# Patient Record
Sex: Female | Born: 1960
Health system: Southern US, Community
[De-identification: ages and names within clinical notes are randomized; demographics above are authoritative.]

## PROBLEM LIST (undated history)

## (undated) DIAGNOSIS — E119 Type 2 diabetes mellitus without complications: Secondary | ICD-10-CM

## (undated) DIAGNOSIS — I3139 Other pericardial effusion (noninflammatory): Secondary | ICD-10-CM

## (undated) DIAGNOSIS — I10 Essential (primary) hypertension: Secondary | ICD-10-CM

## (undated) DIAGNOSIS — I313 Pericardial effusion (noninflammatory): Secondary | ICD-10-CM

## (undated) DIAGNOSIS — I428 Other cardiomyopathies: Secondary | ICD-10-CM

## (undated) DIAGNOSIS — IMO0001 Reserved for inherently not codable concepts without codable children: Secondary | ICD-10-CM

## (undated) DIAGNOSIS — D509 Iron deficiency anemia, unspecified: Secondary | ICD-10-CM

## (undated) DIAGNOSIS — R945 Abnormal results of liver function studies: Secondary | ICD-10-CM

## (undated) DIAGNOSIS — I272 Pulmonary hypertension, unspecified: Secondary | ICD-10-CM

## (undated) DIAGNOSIS — Z720 Tobacco use: Secondary | ICD-10-CM

## (undated) DIAGNOSIS — Z794 Long term (current) use of insulin: Secondary | ICD-10-CM

## (undated) DIAGNOSIS — I5022 Chronic systolic (congestive) heart failure: Secondary | ICD-10-CM

## (undated) DIAGNOSIS — R7989 Other specified abnormal findings of blood chemistry: Secondary | ICD-10-CM

## (undated) DIAGNOSIS — Z9289 Personal history of other medical treatment: Secondary | ICD-10-CM

## (undated) HISTORY — PX: APPENDECTOMY: SHX54

## (undated) NOTE — *Deleted (*Deleted)
Patient states she feels okay this morning. She states ate a part of her breakfast. Endorses feeling cold.

---

## 1984-05-10 DIAGNOSIS — Z9289 Personal history of other medical treatment: Secondary | ICD-10-CM | POA: Insufficient documentation

## 1984-05-10 HISTORY — DX: Personal history of other medical treatment: Z92.89

## 2001-07-24 ENCOUNTER — Inpatient Hospital Stay (HOSPITAL_COMMUNITY): Admission: EM | Admit: 2001-07-24 | Discharge: 2001-07-25 | Payer: Self-pay | Admitting: Emergency Medicine

## 2001-07-24 ENCOUNTER — Encounter: Payer: Self-pay | Admitting: Emergency Medicine

## 2014-06-10 ENCOUNTER — Encounter (HOSPITAL_COMMUNITY): Payer: Self-pay | Admitting: Cardiology

## 2014-06-10 ENCOUNTER — Inpatient Hospital Stay (HOSPITAL_COMMUNITY)
Admission: AD | Admit: 2014-06-10 | Discharge: 2014-06-11 | DRG: 287 | Disposition: A | Discharge status: Home / Self Care | Payer: BLUE CROSS/BLUE SHIELD | Source: Other Acute Inpatient Hospital | Attending: Cardiology | Admitting: Cardiology

## 2014-06-10 DIAGNOSIS — I10 Essential (primary) hypertension: Secondary | ICD-10-CM | POA: Diagnosis present

## 2014-06-10 DIAGNOSIS — I3139 Other pericardial effusion (noninflammatory): Secondary | ICD-10-CM | POA: Diagnosis present

## 2014-06-10 DIAGNOSIS — R7989 Other specified abnormal findings of blood chemistry: Secondary | ICD-10-CM | POA: Diagnosis present

## 2014-06-10 DIAGNOSIS — I272 Pulmonary hypertension, unspecified: Secondary | ICD-10-CM | POA: Diagnosis present

## 2014-06-10 DIAGNOSIS — I313 Pericardial effusion (noninflammatory): Secondary | ICD-10-CM | POA: Diagnosis present

## 2014-06-10 DIAGNOSIS — Z833 Family history of diabetes mellitus: Secondary | ICD-10-CM

## 2014-06-10 DIAGNOSIS — H9190 Unspecified hearing loss, unspecified ear: Secondary | ICD-10-CM | POA: Diagnosis present

## 2014-06-10 DIAGNOSIS — Z823 Family history of stroke: Secondary | ICD-10-CM

## 2014-06-10 DIAGNOSIS — I428 Other cardiomyopathies: Secondary | ICD-10-CM

## 2014-06-10 DIAGNOSIS — I5022 Chronic systolic (congestive) heart failure: Secondary | ICD-10-CM

## 2014-06-10 DIAGNOSIS — Z720 Tobacco use: Secondary | ICD-10-CM

## 2014-06-10 DIAGNOSIS — R945 Abnormal results of liver function studies: Secondary | ICD-10-CM | POA: Diagnosis present

## 2014-06-10 DIAGNOSIS — F17210 Nicotine dependence, cigarettes, uncomplicated: Secondary | ICD-10-CM | POA: Diagnosis present

## 2014-06-10 DIAGNOSIS — E119 Type 2 diabetes mellitus without complications: Secondary | ICD-10-CM

## 2014-06-10 DIAGNOSIS — Z794 Long term (current) use of insulin: Secondary | ICD-10-CM | POA: Diagnosis not present

## 2014-06-10 DIAGNOSIS — Z8249 Family history of ischemic heart disease and other diseases of the circulatory system: Secondary | ICD-10-CM

## 2014-06-10 DIAGNOSIS — I2510 Atherosclerotic heart disease of native coronary artery without angina pectoris: Secondary | ICD-10-CM | POA: Diagnosis present

## 2014-06-10 DIAGNOSIS — I5021 Acute systolic (congestive) heart failure: Secondary | ICD-10-CM | POA: Diagnosis present

## 2014-06-10 HISTORY — DX: Reserved for inherently not codable concepts without codable children: IMO0001

## 2014-06-10 HISTORY — DX: Long term (current) use of insulin: Z79.4

## 2014-06-10 HISTORY — DX: Pericardial effusion (noninflammatory): I31.3

## 2014-06-10 HISTORY — DX: Other cardiomyopathies: I42.8

## 2014-06-10 HISTORY — DX: Essential (primary) hypertension: I10

## 2014-06-10 HISTORY — DX: Tobacco use: Z72.0

## 2014-06-10 HISTORY — DX: Other specified abnormal findings of blood chemistry: R79.89

## 2014-06-10 HISTORY — DX: Pulmonary hypertension, unspecified: I27.20

## 2014-06-10 HISTORY — DX: Type 2 diabetes mellitus without complications: E11.9

## 2014-06-10 HISTORY — DX: Personal history of other medical treatment: Z92.89

## 2014-06-10 HISTORY — DX: Chronic systolic (congestive) heart failure: I50.22

## 2014-06-10 HISTORY — DX: Other pericardial effusion (noninflammatory): I31.39

## 2014-06-10 HISTORY — DX: Abnormal results of liver function studies: R94.5

## 2014-06-10 HISTORY — DX: Iron deficiency anemia, unspecified: D50.9

## 2014-06-10 LAB — CBC WITH DIFFERENTIAL/PLATELET
Basophils Absolute: 0.1 10*3/uL (ref 0.0–0.1)
Basophils Relative: 1 % (ref 0–1)
EOS ABS: 0.1 10*3/uL (ref 0.0–0.7)
Eosinophils Relative: 1 % (ref 0–5)
HCT: 42.7 % (ref 36.0–46.0)
Hemoglobin: 14.7 g/dL (ref 12.0–15.0)
LYMPHS ABS: 2 10*3/uL (ref 0.7–4.0)
Lymphocytes Relative: 23 % (ref 12–46)
MCH: 31.3 pg (ref 26.0–34.0)
MCHC: 34.4 g/dL (ref 30.0–36.0)
MCV: 91 fL (ref 78.0–100.0)
MONO ABS: 0.9 10*3/uL (ref 0.1–1.0)
Monocytes Relative: 10 % (ref 3–12)
Neutro Abs: 5.8 10*3/uL (ref 1.7–7.7)
Neutrophils Relative %: 65 % (ref 43–77)
Platelets: 281 10*3/uL (ref 150–400)
RBC: 4.69 MIL/uL (ref 3.87–5.11)
RDW: 13.4 % (ref 11.5–15.5)
WBC: 8.9 10*3/uL (ref 4.0–10.5)

## 2014-06-10 LAB — PROTIME-INR
INR: 1.07 (ref 0.00–1.49)
Prothrombin Time: 14.1 seconds (ref 11.6–15.2)

## 2014-06-10 LAB — APTT: aPTT: 26 seconds (ref 24–37)

## 2014-06-10 LAB — COMPREHENSIVE METABOLIC PANEL
ALBUMIN: 3.6 g/dL (ref 3.5–5.2)
ALT: 52 U/L — ABNORMAL HIGH (ref 0–35)
AST: 37 U/L (ref 0–37)
Alkaline Phosphatase: 130 U/L — ABNORMAL HIGH (ref 39–117)
Anion gap: 13 (ref 5–15)
BILIRUBIN TOTAL: 0.7 mg/dL (ref 0.3–1.2)
BUN: 20 mg/dL (ref 6–23)
CALCIUM: 9.5 mg/dL (ref 8.4–10.5)
CHLORIDE: 96 mmol/L (ref 96–112)
CO2: 26 mmol/L (ref 19–32)
Creatinine, Ser: 0.99 mg/dL (ref 0.50–1.10)
GFR calc Af Amer: 75 mL/min — ABNORMAL LOW (ref >90)
GFR, EST NON AFRICAN AMERICAN: 64 mL/min — AB (ref >90)
Glucose, Bld: 239 mg/dL — ABNORMAL HIGH (ref 70–99)
POTASSIUM: 3.4 mmol/L — AB (ref 3.5–5.1)
SODIUM: 135 mmol/L (ref 135–145)
Total Protein: 7.3 g/dL (ref 6.0–8.3)

## 2014-06-10 LAB — TSH: TSH: 0.845 u[IU]/mL (ref 0.350–4.500)

## 2014-06-10 LAB — BRAIN NATRIURETIC PEPTIDE: B Natriuretic Peptide: 397 pg/mL — ABNORMAL HIGH (ref 0.0–100.0)

## 2014-06-10 LAB — GLUCOSE, CAPILLARY: Glucose-Capillary: 231 mg/dL — ABNORMAL HIGH (ref 70–99)

## 2014-06-10 LAB — MAGNESIUM: Magnesium: 1.8 mg/dL (ref 1.5–2.5)

## 2014-06-10 MED ORDER — NICOTINE 7 MG/24HR TD PT24
7.0000 mg | MEDICATED_PATCH | Freq: Every day | TRANSDERMAL | Status: DC
  Administered 2014-06-10 – 2014-06-11 (×2): 7 mg via TRANSDERMAL
  Filled 2014-06-10 (×2): qty 1

## 2014-06-10 MED ORDER — INSULIN ASPART PROT & ASPART (70-30 MIX) 100 UNIT/ML ~~LOC~~ SUSP
20.0000 [IU] | Freq: Every day | SUBCUTANEOUS | Status: DC
  Filled 2014-06-10: qty 10

## 2014-06-10 MED ORDER — PNEUMOCOCCAL VAC POLYVALENT 25 MCG/0.5ML IJ INJ
0.5000 mL | INJECTION | INTRAMUSCULAR | Status: AC
  Administered 2014-06-11: 0.5 mL via INTRAMUSCULAR
  Filled 2014-06-10: qty 0.5

## 2014-06-10 MED ORDER — SODIUM CHLORIDE 0.9 % IJ SOLN
3.0000 mL | Freq: Two times a day (BID) | INTRAMUSCULAR | Status: DC
  Administered 2014-06-10: 3 mL via INTRAVENOUS

## 2014-06-10 MED ORDER — CARVEDILOL 3.125 MG PO TABS
3.1250 mg | ORAL_TABLET | Freq: Two times a day (BID) | ORAL | Status: DC
  Administered 2014-06-11 (×2): 3.125 mg via ORAL
  Filled 2014-06-10 (×3): qty 1

## 2014-06-10 MED ORDER — ASPIRIN EC 81 MG PO TBEC
81.0000 mg | DELAYED_RELEASE_TABLET | Freq: Every day | ORAL | Status: DC
  Administered 2014-06-10 – 2014-06-11 (×2): 81 mg via ORAL
  Filled 2014-06-10 (×2): qty 1

## 2014-06-10 MED ORDER — ONDANSETRON HCL 4 MG/2ML IJ SOLN: 4.0000 mg | Freq: Four times a day (QID) | INTRAMUSCULAR | Status: DC | PRN

## 2014-06-10 MED ORDER — HEPARIN SODIUM (PORCINE) 5000 UNIT/ML IJ SOLN
5000.0000 [IU] | Freq: Three times a day (TID) | INTRAMUSCULAR | Status: DC
Start: 2014-06-10 — End: 2014-06-11
  Administered 2014-06-10 – 2014-06-11 (×2): 5000 [IU] via SUBCUTANEOUS
  Filled 2014-06-10 (×4): qty 1

## 2014-06-10 MED ORDER — ATORVASTATIN CALCIUM 80 MG PO TABS
80.0000 mg | ORAL_TABLET | Freq: Every day | ORAL | Status: DC
  Filled 2014-06-10: qty 1

## 2014-06-10 MED ORDER — INSULIN ASPART PROT & ASPART (70-30 MIX) 100 UNIT/ML ~~LOC~~ SUSP
25.0000 [IU] | Freq: Every day | SUBCUTANEOUS | Status: DC
  Filled 2014-06-10: qty 10

## 2014-06-10 MED ORDER — SODIUM CHLORIDE 0.9 % IJ SOLN: 3.0000 mL | INTRAMUSCULAR | Status: DC | PRN

## 2014-06-10 MED ORDER — ENALAPRIL MALEATE 2.5 MG PO TABS
2.5000 mg | ORAL_TABLET | Freq: Two times a day (BID) | ORAL | Status: DC
  Administered 2014-06-10 – 2014-06-11 (×2): 2.5 mg via ORAL
  Filled 2014-06-10 (×3): qty 1

## 2014-06-10 MED ORDER — SODIUM CHLORIDE 0.9 % IV SOLN: 250.0000 mL | INTRAVENOUS | Status: DC | PRN

## 2014-06-10 MED ORDER — ACETAMINOPHEN 325 MG PO TABS: 650.0000 mg | ORAL_TABLET | ORAL | Status: DC | PRN

## 2014-06-10 NOTE — H&P (Addendum)
Patient ID: Christine Benitez MRN: [id number], DOB/AGE: [birth year]   Admit date: 06/10/2014   Primary Physician: No PCP Per Patient Primary Cardiologist: Tobias Alexander, MD  Pt. Profile:  54F smoker with long standing IDDM (~10 years) who is transferred for evaluation of new onset acute systolic heart failure.    Problem List  Past Medical History  Diagnosis Date  . Heart murmur   . Insulin dependent diabetes mellitus   . Iron deficiency anemia   . History of blood transfusion 1986    "related to C-section"    Past Surgical History  Procedure Laterality Date  . Appendectomy  ~ 2003  . Cesarean section  1986     Allergies  No Known Allergies  HPI  54F smoker with long standing IDDM (~10 years) who is transferred for evaluation of new onset acute systolic heart failure.     Christine Benitez states she was in her USOH until Friday when she fairly abruptly felt "really congested" and had "trouble breathing." She had a dry cough and occasionally had pain with cough, but not otherwise.  No palpitations, syncope, presyncope, edema, fevers, chills, or sweats.   She presented to the [facility] ER and was admitted on 1/31 with a diagnosis of CHF. Troponins were negative x 3. She was diuresed with symptomatic improvement. TTE demonstrated EF 30-35%, moderate LVH, no rWMA, mild to moderate MR, moderate PH with PA pressure 60-56mmHg, Mild to moderate pericardial effusion. She was initially started on enalapril. She was transferred to [facility] for cardiac cath.   She is a 1/2 PPD smoker for the past 20 years. Her mother (who carried a dx of lupus) had an MI in her 106s.  Patient worse full time as a CNA. No alcohol or ilicits.   Home Medications  Prior to Admission medications   Medication Sig Start Date End Date Taking? Authorizing Provider  insulin aspart protamine- aspart (NOVOLOG MIX 70/30) (70-30) 100 UNIT/ML injection Inject 25-35 Units into the skin See admin instructions. 35units in  the morning and 25 units in the afternoon   Yes Historical Provider, MD    Family History  Family History  Problem Relation Age of Onset  . Coronary artery disease Mother   . Diabetes Mother   . Lupus Mother   . Stroke Mother   . Lung cancer Father     Social History  History   Social History  . Marital Status: Divorced    Spouse Name: N/A    Number of Children: N/A  . Years of Education: N/A   Occupational History  . Not on file.   Social History Main Topics  . Smoking status: Current Every Day Smoker -- 0.50 packs/day for 22 years    Types: Cigarettes  . Smokeless tobacco: Never Used  . Alcohol Use: No  . Drug Use: No  . Sexual Activity: Yes   Other Topics Concern  . Not on file   Social History Narrative  . No narrative on file     Review of Systems General:  No chills, fever, night sweats or weight changes.  Cardiovascular:  See HPI Dermatological: No rash, lesions/masses Respiratory: See HPI Urologic: No hematuria, dysuria Abdominal:   No nausea, vomiting, diarrhea, bright red blood per rectum, melena, or hematemesis Neurologic:  No visual changes, wkns, changes in mental status. All other systems reviewed and are otherwise negative except as noted above.  Physical Exam  Blood pressure 150/95, pulse 103, temperature 98 F (36.7 C), temperature  source Oral, resp. rate 18, height 5\' 3"  (1.6 m), weight 55.2 kg (121 lb 11.1 oz), SpO2 96 %.  General: Pleasant, NAD Psych: Normal affect. Neuro: Alert and oriented X 3. Moves all extremities spontaneously. HEENT: Normal  Neck: Supple without bruits. JVP elevated to ~ 12cmH20 Lungs:  Resp regular and unlabored, CTA. Heart: RRR no s3, s4, or murmurs. Abdomen: Soft, non-tender, non-distended, BS + x 4.  Extremities: No clubbing, cyanosis or edema. DP/PT/Radials 2+ and equal bilaterally.  Labs  Troponin (Point of Care Test) No results for input(s): TROPIPOC in the last 72 hours. No results for input(s):  CKTOTAL, CKMB, TROPONINI in the last 72 hours. No results found for: WBC, HGB, HCT, MCV, PLT No results for input(s): NA, K, CL, CO2, BUN, CREATININE, CALCIUM, PROT, BILITOT, ALKPHOS, ALT, AST, GLUCOSE in the last 168 hours.  Invalid input(s): LABALBU No results found for: CHOL, HDL, LDLCALC, TRIG No results found for: DDIMER   Radiology/Studies  No results found.  ECG  ST, biatrial enlargement, LVH, lateral TWI (LV strain type pattern)  ASSESSMENT AND PLAN  54F smoker with long standing IDDM who is transferred for evaluation of new onset acute systolic heart failure. Given multiple risk factors for CAD (IDDM, smoker, strong FH) in the setting of new systolic HF, a cardiac cath is warranted. She is nearly euvolemic with minimal JVD and no edema to speak of.   Acute systolic HF. Etiology unclear. High risk for CAD. She appears nearly euvolemic at this time. Extremities are warm.  1. Given persistent HTN and tachycardia will initiate coreg at 3.125mg  BID 2. Continue enalapril 2.5mg  BID 3. ASA 81mg  4. Atorva 80mg  (she meets criteria based on DM alone)  5. Will hold off on additional diuretic dose given plan for cath in the AM. She can lie flat comfortably.  6. NPO for cath 7. Check lipids  DM. Home dose novolog 70/30 35 in AM, 25 in afternoon. Will reduce AM dose to 20 as she will be NPO. Check A1c.   Tobacco abuse. She is interested in quitting.  1. Nicotine patch 2. Smoking cessation counseling   Signed, Glori Luis, MD 06/10/2014, 8:40 PM

## 2014-06-10 NOTE — Progress Notes (Signed)
Still waiting for admission orders MD aware as reported by previous RN.

## 2014-06-10 NOTE — Progress Notes (Signed)
1900 no order received .Spoken with Dr. Delton See in the 3e unit. Placed a call to covering MD, Dr. Gracy Racer

## 2014-06-11 ENCOUNTER — Encounter (HOSPITAL_COMMUNITY)
Admission: AD | Disposition: A | Discharge status: Home / Self Care | Payer: Self-pay | Source: Other Acute Inpatient Hospital | Attending: Cardiology

## 2014-06-11 ENCOUNTER — Ambulatory Visit (HOSPITAL_COMMUNITY): Admission: RE | Admit: 2014-06-11 | Payer: Self-pay | Source: Ambulatory Visit | Admitting: Cardiology

## 2014-06-11 ENCOUNTER — Encounter (HOSPITAL_COMMUNITY): Payer: Self-pay | Admitting: Physician Assistant

## 2014-06-11 DIAGNOSIS — I10 Essential (primary) hypertension: Secondary | ICD-10-CM | POA: Diagnosis present

## 2014-06-11 DIAGNOSIS — I3139 Other pericardial effusion (noninflammatory): Secondary | ICD-10-CM | POA: Diagnosis present

## 2014-06-11 DIAGNOSIS — I313 Pericardial effusion (noninflammatory): Secondary | ICD-10-CM | POA: Diagnosis present

## 2014-06-11 DIAGNOSIS — I509 Heart failure, unspecified: Secondary | ICD-10-CM

## 2014-06-11 DIAGNOSIS — R945 Abnormal results of liver function studies: Secondary | ICD-10-CM | POA: Diagnosis present

## 2014-06-11 DIAGNOSIS — I272 Pulmonary hypertension, unspecified: Secondary | ICD-10-CM | POA: Diagnosis present

## 2014-06-11 DIAGNOSIS — I428 Other cardiomyopathies: Secondary | ICD-10-CM

## 2014-06-11 DIAGNOSIS — R7989 Other specified abnormal findings of blood chemistry: Secondary | ICD-10-CM | POA: Diagnosis present

## 2014-06-11 DIAGNOSIS — I429 Cardiomyopathy, unspecified: Secondary | ICD-10-CM

## 2014-06-11 HISTORY — PX: LEFT HEART CATHETERIZATION WITH CORONARY ANGIOGRAM: SHX5451

## 2014-06-11 LAB — BASIC METABOLIC PANEL
Anion gap: 12 (ref 5–15)
BUN: 22 mg/dL (ref 6–23)
CHLORIDE: 100 mmol/L (ref 96–112)
CO2: 25 mmol/L (ref 19–32)
Calcium: 9.3 mg/dL (ref 8.4–10.5)
Creatinine, Ser: 0.97 mg/dL (ref 0.50–1.10)
GFR calc non Af Amer: 66 mL/min — ABNORMAL LOW (ref >90)
GFR, EST AFRICAN AMERICAN: 76 mL/min — AB (ref >90)
GLUCOSE: 141 mg/dL — AB (ref 70–99)
Potassium: 4.1 mmol/L (ref 3.5–5.1)
SODIUM: 137 mmol/L (ref 135–145)

## 2014-06-11 LAB — GLUCOSE, CAPILLARY
Glucose-Capillary: 72 mg/dL (ref 70–99)
Glucose-Capillary: 158 mg/dL — ABNORMAL HIGH (ref 70–99)

## 2014-06-11 LAB — CBC
HCT: 41.3 % (ref 36.0–46.0)
Hemoglobin: 13.9 g/dL (ref 12.0–15.0)
MCH: 31 pg (ref 26.0–34.0)
MCHC: 33.7 g/dL (ref 30.0–36.0)
MCV: 92.2 fL (ref 78.0–100.0)
PLATELETS: 286 10*3/uL (ref 150–400)
RBC: 4.48 MIL/uL (ref 3.87–5.11)
RDW: 13.5 % (ref 11.5–15.5)
WBC: 8 10*3/uL (ref 4.0–10.5)

## 2014-06-11 LAB — LIPID PANEL
CHOL/HDL RATIO: 5.5 ratio
CHOLESTEROL: 153 mg/dL (ref 0–200)
HDL: 28 mg/dL — ABNORMAL LOW (ref >39)
LDL CALC: 104 mg/dL — AB (ref 0–99)
Triglycerides: 107 mg/dL (ref <150)
VLDL: 21 mg/dL (ref 0–40)

## 2014-06-11 LAB — CREATININE, SERUM
Creatinine, Ser: 0.94 mg/dL (ref 0.50–1.10)
GFR calc non Af Amer: 69 mL/min — ABNORMAL LOW (ref >90)
GFR, EST AFRICAN AMERICAN: 79 mL/min — AB (ref >90)

## 2014-06-11 SURGERY — LEFT HEART CATHETERIZATION WITH CORONARY ANGIOGRAM

## 2014-06-11 MED ORDER — ATORVASTATIN CALCIUM 20 MG PO TABS
20.0000 mg | ORAL_TABLET | Freq: Every day | ORAL | Status: DC
  Administered 2014-06-11: 20 mg via ORAL
  Filled 2014-06-11: qty 1

## 2014-06-11 MED ORDER — LIDOCAINE HCL (PF) 1 % IJ SOLN
INTRAMUSCULAR | Status: AC
  Filled 2014-06-11: qty 30

## 2014-06-11 MED ORDER — ASPIRIN 81 MG PO TBEC: 81.0000 mg | DELAYED_RELEASE_TABLET | Freq: Every day | ORAL | Status: DC

## 2014-06-11 MED ORDER — SODIUM CHLORIDE 0.9 % IJ SOLN: 3.0000 mL | INTRAMUSCULAR | Status: DC | PRN

## 2014-06-11 MED ORDER — FENTANYL CITRATE 0.05 MG/ML IJ SOLN
INTRAMUSCULAR | Status: AC
  Filled 2014-06-11: qty 2

## 2014-06-11 MED ORDER — SODIUM CHLORIDE 0.9 % IV SOLN
INTRAVENOUS | Status: DC
Start: 2014-06-11 — End: 2014-06-11
  Administered 2014-06-11: 06:00:00 via INTRAVENOUS

## 2014-06-11 MED ORDER — ATORVASTATIN CALCIUM 20 MG PO TABS: 20.0000 mg | ORAL_TABLET | Freq: Every evening | ORAL | Status: DC

## 2014-06-11 MED ORDER — SODIUM CHLORIDE 0.9 % IV SOLN: 1.0000 mL/kg/h | INTRAVENOUS | Status: DC

## 2014-06-11 MED ORDER — SODIUM CHLORIDE 0.9 % IV SOLN: 250.0000 mL | INTRAVENOUS | Status: DC | PRN

## 2014-06-11 MED ORDER — MIDAZOLAM HCL 2 MG/2ML IJ SOLN
INTRAMUSCULAR | Status: AC
  Filled 2014-06-11: qty 2

## 2014-06-11 MED ORDER — SODIUM CHLORIDE 0.9 % IJ SOLN
3.0000 mL | Freq: Two times a day (BID) | INTRAMUSCULAR | Status: DC
  Administered 2014-06-11: 3 mL via INTRAVENOUS

## 2014-06-11 MED ORDER — FUROSEMIDE 20 MG PO TABS
20.0000 mg | ORAL_TABLET | Freq: Every day | ORAL | Status: DC | PRN
Start: 2014-06-11 — End: 2015-05-02

## 2014-06-11 MED ORDER — HEPARIN (PORCINE) IN NACL 2-0.9 UNIT/ML-% IJ SOLN
INTRAMUSCULAR | Status: AC
  Filled 2014-06-11: qty 1000

## 2014-06-11 MED ORDER — CARVEDILOL 3.125 MG PO TABS: 3.1250 mg | ORAL_TABLET | Freq: Two times a day (BID) | ORAL | Status: DC

## 2014-06-11 MED ORDER — VERAPAMIL HCL 2.5 MG/ML IV SOLN
INTRAVENOUS | Status: AC
  Filled 2014-06-11: qty 2

## 2014-06-11 MED ORDER — ASPIRIN 81 MG PO CHEW
81.0000 mg | CHEWABLE_TABLET | ORAL | Status: AC
  Administered 2014-06-11: 81 mg via ORAL
  Filled 2014-06-11: qty 1

## 2014-06-11 MED ORDER — ENALAPRIL MALEATE 2.5 MG PO TABS: 2.5000 mg | ORAL_TABLET | Freq: Two times a day (BID) | ORAL | Status: DC

## 2014-06-11 MED ORDER — NITROGLYCERIN 1 MG/10 ML FOR IR/CATH LAB
INTRA_ARTERIAL | Status: AC
  Filled 2014-06-11: qty 10

## 2014-06-11 MED ORDER — HEPARIN SODIUM (PORCINE) 5000 UNIT/ML IJ SOLN: 5000.0000 [IU] | Freq: Three times a day (TID) | INTRAMUSCULAR | Status: DC

## 2014-06-11 MED ORDER — HEPARIN SODIUM (PORCINE) 1000 UNIT/ML IJ SOLN
INTRAMUSCULAR | Status: AC
  Filled 2014-06-11: qty 1

## 2014-06-11 MED ORDER — LIVING BETTER WITH HEART FAILURE BOOK
Freq: Once | Status: AC
  Administered 2014-06-11: 17:00:00

## 2014-06-11 NOTE — Progress Notes (Signed)
Pt discharging via wheelchair, escorted by staff, meeting daughter downstairs. PIV's removed, no s/s of complications. Tele removed and returned to unit. PNA vaccine provided prior to discharge. Education provided with emphasis on HF, smoking cessation, and post cath care. Verbalizes understanding, able to teach back but stumbles at times; reviewed a second time, referred to paperwork and online resources for further reinforcement. Denies needs or questions at this time.

## 2014-06-11 NOTE — H&P (View-Only) (Signed)
   SUBJECTIVE:  No further chest pain  OBJECTIVE:   Vitals:   Filed Vitals:   06/10/14 1737 06/10/14 1946 06/11/14 0528  BP: 144/89 150/95 98/70  Pulse: 99 103 95  Temp: 98.6 F (37 C) 98 F (36.7 C) 98.2 F (36.8 C)  TempSrc: Oral Oral Oral  Resp: 16 18 18   Height: 5\' 3"  (1.6 m)    Weight: 121 lb 11.1 oz (55.2 kg)  121 lb 4.1 oz (55 kg)  SpO2: 93% 96% 98%   I&O's:   Intake/Output Summary (Last 24 hours) at 06/11/14 0858 Last data filed at 06/11/14 0600  Gross per 24 hour  Intake  280.5 ml  Output      0 ml  Net  280.5 ml   TELEMETRY: Reviewed telemetry pt in normal sinus rhythm:     PHYSICAL EXAM General: Well developed, well nourished, in no acute distress Head:   Normal cephalic and atramatic  Lungs:   Clear bilaterally to auscultation. Heart:   HRRR S1 S2  No JVD.   Abdomen: abdomen soft and non-tender Msk:  Back normal,  Normal strength and tone for age. Extremities:   No edema.   Neuro: Alert and oriented. Psych:  Normal affect, responds appropriately Skin: No rash   LABS: Basic Metabolic Panel:  Recent Labs  32/41/99 2148 06/11/14 0315  NA 135 137  K 3.4* 4.1  CL 96 100  CO2 26 25  GLUCOSE 239* 141*  BUN 20 22  CREATININE 0.99 0.97  CALCIUM 9.5 9.3  MG 1.8  --    Liver Function Tests:  Recent Labs  06/10/14 2148  AST 37  ALT 52*  ALKPHOS 130*  BILITOT 0.7  PROT 7.3  ALBUMIN 3.6   No results for input(s): LIPASE, AMYLASE in the last 72 hours. CBC:  Recent Labs  06/10/14 2148  WBC 8.9  NEUTROABS 5.8  HGB 14.7  HCT 42.7  MCV 91.0  PLT 281   Cardiac Enzymes: No results for input(s): CKTOTAL, CKMB, CKMBINDEX, TROPONINI in the last 72 hours. BNP: Invalid input(s): POCBNP D-Dimer: No results for input(s): DDIMER in the last 72 hours. Hemoglobin A1C: No results for input(s): HGBA1C in the last 72 hours. Fasting Lipid Panel:  Recent Labs  06/11/14 0315  CHOL 153  HDL 28*  LDLCALC 104*  TRIG 107  CHOLHDL 5.5    Thyroid Function Tests:  Recent Labs  06/10/14 2148  TSH 0.845   Anemia Panel: No results for input(s): VITAMINB12, FOLATE, FERRITIN, TIBC, IRON, RETICCTPCT in the last 72 hours. Coag Panel:   Lab Results  Component Value Date   INR 1.07 06/10/2014    RADIOLOGY: No results found.    ASSESSMENT: /PLAN:  New-onset cardiomyopathy. Acute systolic heart failure. Well compensated now. Plan for cardiac catheterization to evaluate for coronary artery disease. The procedure was explained to the patient. All questions were answered.  Ejection fraction was 30-35%. Continue ACE inhibitor and beta blocker as blood pressure allows.  She needs to stop smoking.  Aggressive diabetes control.  Corky Crafts, MD  06/11/2014  8:58 AM

## 2014-06-11 NOTE — Interval H&P Note (Signed)
History and Physical Interval Note:  06/11/2014 10:16 AM  Christine Benitez  has presented today for surgery, with the diagnosis of cp  The various methods of treatment have been discussed with the patient and family. After consideration of risks, benefits and other options for treatment, the patient has consented to  Procedure(s): LEFT HEART CATHETERIZATION WITH CORONARY ANGIOGRAM (N/A) as a surgical intervention .  The patient's history has been reviewed, patient examined, no change in status, stable for surgery.  I have reviewed the patient's chart and labs.  Questions were answered to the patient's satisfaction.   Cath Lab Visit (complete for each Cath Lab visit)  Clinical Evaluation Leading to the Procedure:   ACS: No.  Non-ACS:    Anginal Classification: CCS III  Anti-ischemic medical therapy: No Therapy  Non-Invasive Test Results: No non-invasive testing performed  Prior CABG: No previous CABG        Theron Arista [facility] 06/11/2014 10:16 AM

## 2014-06-11 NOTE — Discharge Summary (Signed)
Discharge Summary   Patient ID: Christine Benitez MRN: [id number], DOB/AGE: [birth year] 54 y.o. Admit date: 06/10/2014 D/C date:     06/11/2014  Primary Care Provider: Leonides Sake, MD Primary Cardiologist: [place]  Primary Discharge Diagnoses:  1. 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 Hypertension 4. Ongoing tobacco abuse 5. Diabetes mellitus, insulin dependent 6. Mild-moderate pericardial effusion by echo at [facility] 7. Mildly abnormal LFTs - ALT 52, alk phos 130 8. Moderate pulm HTN by echo at [facility]  Secondary Discharge Diagnoses:  1. H/o iron deficiency anemia - normal CBC this admission  Hospital Course: [name] is a 54 y/o F with history of tobacco abuse, longstanding IDDM who presented initially to [facility] with dyspnea. She was in her USOH until Friday 06/07/14 when she fairly abruptly felt "really congested" and had "trouble breathing." She had a dry cough and occasionally had pain with cough, but not otherwise.No palpitations, syncope, presyncope, edema, fevers, chills, or sweats. She works full time as a [employer] manager and denied any alcohol or illicit drug use. She has been a 1/2 PPD smoker for the past 20 years. Her mother (who carried a dx of lupus) had an MI in her 107s.  She presented to the [facility] ER and was admitted on 1/31 with a diagnosis of CHF. Troponins were negative x 3. She was diuresed with symptomatic improvement. TTE demonstrated EF 30-35%, moderate LVH, no rWMA, mild to moderate MR, moderate PH with PA pressure 60-90mHg, mild to moderate pericardial effusion. She was started on enalapril. [facility]. She was placed on beta blocker and statin. Tobacco cessation was advised. She underwent cardiac catheterization on 06/11/14 showing minor nonobstructive CAD with mild irregularities in the LAD less than 10-20%, LCx with mild disease less than 20%, no significant disease  in the RCA, EF 30-35%. Continued medical therapy was recommended. She feels much better and is hemodynamically stable. No ventricular arrhythmias on telemetry noted. Dr. VIrish Lackhas seen and examined the patient today and feels she is stable for discharge. She will be continued on ASA, BB, ACEI, PRN Lasix. Statin was dropped to 276mdaily given lack of significant CAD and mildly elevated LFTs earlier in admission (AST normal, ALT 52, alk phos 130). BP has tended to run on the softer side later in admission, prohibiting further aggressive med titration but we can consider this in follow-up. HF teaching done by myself and I also asked nursing to spend extra time with her to review as well. She is somewhat hard of hearing.  [place] [facility] [place] Benite[place] Will assign her to Dr. JoMartiniquen that case. I have sent a message to our [place] office's scheduler requesting a follow-up appointment, and our office will call the patient with this information. At f/u would consider repeat LFTs given recently abnormal ALT and alk phos, and to discuss timing of repeat echocardiogram to either reassess pericardial effusion or determine if she will require ICD placement. Will also need arrangement of lipids/LFTs 6-8 weeks post-statin initiation. Dr. VaIrish Lacks OK with her returning to work in 1 week.  Discharge Vitals: Blood pressure 110/62, pulse 68, temperature 98.1 F (36.7 C), temperature source Oral, resp. rate 16, height _0  (1.6 m), weight 121 lb 4.1 oz (55 kg), SpO2 96 %.  Labs: Lab Results  Component Value Date   WBC 8.0 06/11/2014  HGB 13.9 06/11/2014   HCT 41.3 06/11/2014   MCV 92.2 06/11/2014   PLT 286 06/11/2014    Recent Labs Lab 06/10/14 2148 06/11/14 0315 06/11/14 1210  NA 135 137  --   K 3.4* 4.1  --   CL 96 100  --   CO2 26 25  --   BUN 20 22  --   CREATININE 0.99 0.97 0.94  CALCIUM 9.5 9.3  --   PROT 7.3  --   --   BILITOT 0.7  --   --    ALKPHOS 130*  --   --   ALT 52*  --   --   AST 37  --   --   GLUCOSE 239* 141*  --     Lab Results  Component Value Date   CHOL 153 06/11/2014   HDL 28* 06/11/2014   LDLCALC 104* 06/11/2014   TRIG 107 06/11/2014   Diagnostic Studies/Procedures   NOTE: At time of discharge, I no longer have access to her [place] records. It appears they have been sent to medical records. See H/P and above for summary of those results.  Cardiac Cath 06/11/14 Cardiac Catheterization Procedure Note Name: [name] MRN: [id number] DOB: 04-02-1961 Procedure: Left Heart Cath, Selective Coronary Angiography, LV angiography Indication: 54 yo BF presents with new onset CHF and cardiomyopathy.  Procedural Details: The right wrist was prepped, draped, and anesthetized with 1% lidocaine. Using the modified Seldinger technique, a 6 French slender sheath was introduced into the right radial artery. 3 mg of verapamil was administered through the sheath, weight-based unfractionated heparin was administered intravenously. Standard Judkins catheters were used for selective coronary angiography and left ventriculography. Catheter exchanges were performed over an exchange length guidewire. There were no immediate procedural complications. A TR band was used for radial hemostasis at the completion of the procedure. The patient was transferred to the post catheterization recovery area for further monitoring.  Procedural Findings: Hemodynamics: AO 105/62 mean 81 mm Hg LV 104/15 mm Hg  Coronary angiography: Coronary dominance: left  Left mainstem: Normal.   Left anterior descending (LAD): The LAD is a large vessel that wraps around the apex and supplies the inferior wall. There are mild irregularities less than 10-20%.   Left circumflex (LCx): Large dominant vessel with mild disease less than 20%.   Right coronary artery (RCA): Small, nondominant. No significant disease.    Left ventriculography: Left ventricular systolic function is abnormal, there is global hypokinesis. LVEF is estimated at 30-35%, there is no significant mitral regurgitation   Final Conclusions:  1. Minor nonobstructive CAD 2. Severe LV dysfunction Recommendations: Medical management.  Peter [place], MDFACc 06/11/2014, 11:00 AM  Discharge Medications   Current Discharge Medication List    START taking these medications   Details  aspirin EC 81 MG EC tablet Take 1 tablet (81 mg total) by mouth daily. Qty: 30 tablet, Refills: 3    atorvastatin (LIPITOR) 20 MG tablet Take 1 tablet (20 mg total) by mouth every evening. Qty: 30 tablet, Refills: 3    carvedilol (COREG) 3.125 MG tablet Take 1 tablet (3.125 mg total) by mouth 2 (two) times daily with a meal. Qty: 60 tablet, Refills: 3    enalapril (VASOTEC) 2.5 MG tablet Take 1 tablet (2.5 mg total) by mouth 2 (two) times daily. Qty: 60 tablet, Refills: 3    furosemide (LASIX) 20 MG tablet Take 1 tablet (20 mg total) by mouth daily as needed (for weight gain of 3-5  pounds, fluid overload, or shortness of breath). Qty: 30 tablet, Refills: 3      CONTINUE these medications which have NOT CHANGED   Details  insulin aspart protamine- aspart (NOVOLOG MIX 70/30) (70-30) 100 UNIT/ML injection Inject 25-35 Units into the skin See admin instructions. 35units in the morning and 25 units in the afternoon        Disposition   The patient will be discharged in stable condition to home. Discharge Instructions    Diet - low sodium heart healthy    Complete by:  As directed      Increase activity slowly    Complete by:  As directed   No driving for 2 days. No lifting over 5 lbs for 1 week. No sexual activity for 1 week. You may return to work in 1 week if you are feeling well. Keep procedure site clean & dry. If you notice increased pain, swelling, bleeding or pus, call/return!  You may shower, but no soaking baths/hot tubs/pools for 1 week.           Follow-up Information    Follow up with Peter [place], MD.   Specialty:  Cardiology   Why:  Office will call you for your followup appointment. Call office if you have not heard back in 3 days.   Contact information:   [place] [address] [phone]         Duration of Discharge Encounter: Greater than 30 minutes including physician and PA time.  Signed, Melina Copa PA-C 06/11/2014, 3:44 PM  I have examined the patient and reviewed assessment and plan and discussed with patient.  Agree with above as stated.  Nonischemic cardiomyopathy.   Ejection fraction was 30-35%. Continue ACE inhibitor and beta blocker as blood pressure allows.  She needs to stop smoking.  Aggressive diabetes control.  Jettie Booze, MD

## 2014-06-11 NOTE — Progress Notes (Signed)
Responded to spiritual care consult to provide emotional and spiritual support to patient that requested visit and prayer.  Prayed with patient,staff and family.  Patient appreciated visit and chaplain presence.  Patient recalled me working with another family member and that she and family was pleased. Will follow as needed.   06/11/14 1200  Clinical Encounter Type  Visited With Patient and family together;Health care provider  Visit Type Initial;Spiritual support  Referral From Patient;Nurse  Spiritual Encounters  Spiritual Needs Prayer;Emotional  Stress Factors  Patient Stress Factors None identified  Family Stress Factors None identified  Venida Jarvis, [place], pager, [phone]

## 2014-06-11 NOTE — CV Procedure (Signed)
    Cardiac Catheterization Procedure Note  Name: Christine Benitez MRN: [id number] DOB: 1960-09-23  Procedure: Left Heart Cath, Selective Coronary Angiography, LV angiography  Indication: 54 yo BF presents with new onset CHF and cardiomyopathy.   Procedural Details: The right wrist was prepped, draped, and anesthetized with 1% lidocaine. Using the modified Seldinger technique, a 6 French slender sheath was introduced into the right radial artery. 3 mg of verapamil was administered through the sheath, weight-based unfractionated heparin was administered intravenously. Standard Judkins catheters were used for selective coronary angiography and left ventriculography. Catheter exchanges were performed over an exchange length guidewire. There were no immediate procedural complications. A TR band was used for radial hemostasis at the completion of the procedure.  The patient was transferred to the post catheterization recovery area for further monitoring.  Procedural Findings: Hemodynamics: AO 105/62 mean 81 mm Hg LV 104/15 mm Hg  Coronary angiography: Coronary dominance: left  Left mainstem: Normal.   Left anterior descending (LAD): The LAD is a large vessel that wraps around the apex and supplies the inferior wall. There are mild irregularities less than 10-20%.   Left circumflex (LCx): Large dominant vessel with mild disease less than 20%.   Right coronary artery (RCA): Small, nondominant. No significant disease.   Left ventriculography: Left ventricular systolic function is abnormal, there is global hypokinesis.  LVEF is estimated at 30-35%, there is no significant mitral regurgitation   Final Conclusions:   1. Minor nonobstructive CAD 2. Severe LV dysfunction  Recommendations: Medical management.   Peter [place], MDFACC  06/11/2014, 11:00 AM

## 2014-06-11 NOTE — Progress Notes (Signed)
   SUBJECTIVE:  No further chest pain  OBJECTIVE:   Vitals:   Filed Vitals:   06/10/14 1737 06/10/14 1946 06/11/14 0528  BP: 144/89 150/95 98/70  Pulse: 99 103 95  Temp: 98.6 F (37 C) 98 F (36.7 C) 98.2 F (36.8 C)  TempSrc: Oral Oral Oral  Resp: 16 18 18  Height: 5' 3" (1.6 m)    Weight: 121 lb 11.1 oz (55.2 kg)  121 lb 4.1 oz (55 kg)  SpO2: 93% 96% 98%   I&O's:   Intake/Output Summary (Last 24 hours) at 06/11/14 0858 Last data filed at 06/11/14 0600  Gross per 24 hour  Intake  280.5 ml  Output      0 ml  Net  280.5 ml   TELEMETRY: Reviewed telemetry pt in normal sinus rhythm:     PHYSICAL EXAM General: Well developed, well nourished, in no acute distress Head:   Normal cephalic and atramatic  Lungs:   Clear bilaterally to auscultation. Heart:   HRRR S1 S2  No JVD.   Abdomen: abdomen soft and non-tender Msk:  Back normal,  Normal strength and tone for age. Extremities:   No edema.   Neuro: Alert and oriented. Psych:  Normal affect, responds appropriately Skin: No rash   LABS: Basic Metabolic Panel:  Recent Labs  06/10/14 2148 06/11/14 0315  NA 135 137  K 3.4* 4.1  CL 96 100  CO2 26 25  GLUCOSE 239* 141*  BUN 20 22  CREATININE 0.99 0.97  CALCIUM 9.5 9.3  MG 1.8  --    Liver Function Tests:  Recent Labs  06/10/14 2148  AST 37  ALT 52*  ALKPHOS 130*  BILITOT 0.7  PROT 7.3  ALBUMIN 3.6   No results for input(s): LIPASE, AMYLASE in the last 72 hours. CBC:  Recent Labs  06/10/14 2148  WBC 8.9  NEUTROABS 5.8  HGB 14.7  HCT 42.7  MCV 91.0  PLT 281   Cardiac Enzymes: No results for input(s): CKTOTAL, CKMB, CKMBINDEX, TROPONINI in the last 72 hours. BNP: Invalid input(s): POCBNP D-Dimer: No results for input(s): DDIMER in the last 72 hours. Hemoglobin A1C: No results for input(s): HGBA1C in the last 72 hours. Fasting Lipid Panel:  Recent Labs  06/11/14 0315  CHOL 153  HDL 28*  LDLCALC 104*  TRIG 107  CHOLHDL 5.5    Thyroid Function Tests:  Recent Labs  06/10/14 2148  TSH 0.845   Anemia Panel: No results for input(s): VITAMINB12, FOLATE, FERRITIN, TIBC, IRON, RETICCTPCT in the last 72 hours. Coag Panel:   Lab Results  Component Value Date   INR 1.07 06/10/2014    RADIOLOGY: No results found.    ASSESSMENT: /PLAN:  New-onset cardiomyopathy. Acute systolic heart failure. Well compensated now. Plan for cardiac catheterization to evaluate for coronary artery disease. The procedure was explained to the patient. All questions were answered.  Ejection fraction was 30-35%. Continue ACE inhibitor and beta blocker as blood pressure allows.  She needs to stop smoking.  Aggressive diabetes control.  Jayadeep S Varanasi, MD  06/11/2014  8:58 AM   

## 2014-06-12 ENCOUNTER — Encounter: Payer: Self-pay | Admitting: *Deleted

## 2014-06-12 ENCOUNTER — Encounter (HOSPITAL_COMMUNITY): Payer: Self-pay | Admitting: Cardiology

## 2014-06-12 LAB — HEMOGLOBIN A1C
HEMOGLOBIN A1C: 7.7 % — AB (ref 4.8–5.6)
MEAN PLASMA GLUCOSE: 174 mg/dL

## 2014-06-13 ENCOUNTER — Telehealth: Payer: Self-pay | Admitting: Physician Assistant

## 2014-06-13 ENCOUNTER — Telehealth: Payer: Self-pay | Admitting: Cardiology

## 2014-06-13 NOTE — Telephone Encounter (Signed)
TOC call, no answer.  Second call

## 2014-06-13 NOTE — Telephone Encounter (Signed)
TOC  Pt called for post hospital call. No answer.

## 2014-06-19 ENCOUNTER — Ambulatory Visit: Payer: BLUE CROSS/BLUE SHIELD | Admitting: Physician Assistant

## 2014-07-11 ENCOUNTER — Encounter: Payer: Self-pay | Admitting: Cardiology

## 2014-07-11 ENCOUNTER — Ambulatory Visit (INDEPENDENT_AMBULATORY_CARE_PROVIDER_SITE_OTHER): Payer: BLUE CROSS/BLUE SHIELD | Admitting: Cardiology

## 2014-07-11 VITALS — BP 112/64 | HR 97 | Ht 62.0 in | Wt 126.5 lb

## 2014-07-11 DIAGNOSIS — R748 Abnormal levels of other serum enzymes: Secondary | ICD-10-CM

## 2014-07-11 DIAGNOSIS — I255 Ischemic cardiomyopathy: Secondary | ICD-10-CM

## 2014-07-11 DIAGNOSIS — I10 Essential (primary) hypertension: Secondary | ICD-10-CM

## 2014-07-11 MED ORDER — CARVEDILOL 3.125 MG PO TABS: ORAL_TABLET | ORAL | Status: DC

## 2014-07-11 NOTE — Patient Instructions (Addendum)
Please follow up with an extender in 3 weeks.  Please follow up with Dr. Swaziland near the End of May.  Echocardiogram (beginning of May). Echocardiography is a painless test that uses sound waves to create images of your heart. It provides your doctor with information about the size and shape of your heart and how well your heart's chambers and valves are working. This procedure takes approximately one hour. There are no restrictions for this procedure.  START taking your Carvedilol, 1 tablet in the morning and 2 tablets in the evening.  [address]; have blood drawn

## 2014-07-11 NOTE — Progress Notes (Signed)
Cardiology Office Note   Date:  07/11/2014   ID:  Christine Benitez, DOB [birth year], MRN [id number]  PCP:  Ailene Ravel, MD  Cardiologist:  Dr. P. Swaziland     Chief Complaint  Patient presents with  . Cardiomyopathy    Heart Cath on 06/11/14; no cardiac complaints      History of Present Illness: Christine Benitez is a 54 y.o. female who presents for her NICM and post hospital follow up.     She has a history of tobacco abuse, longstanding IDDM who presented initially to [facility] with dyspnea. She works full time as a Lawyer and denied any alcohol or illicit drug use. She had been a 1/2 PPD smoker for the past 20 years but prior to admit she was up to > 1ppd. Her mother (who carried a dx of lupus) had an MI in her 37s.  She was admitted iwht [facility] and Echo revealed EF 30-35%, moderate LVH, no rWMA, mild to moderate MR, moderate PH with PA pressure 60-35mmHg, mild to moderate pericardial effusion. Cardiac cath revealed minor nonobstructive CAD with mild irregularities in the LAD less than 10-20%, LCx with mild disease less than 20%, no significant disease in the RCA, EF 30-35%, CAD treated with medical therapy.  She had no ventricular arrhthymias.  Her LFTs were elevated and will need follow up.  Pt is also hard of hearing.   Today she is doing well.  No chest pain, no SOB.  She is able to walk longer with more energy.  Though she is not walking for exercise daily.  She is sleeping well.  She weighs daily and though her wt is up from discharge it is stable for last several weeks.  With her tobacco she is down to 1-2 cigarettes a day.  She did not tolerate vaper cigarettes, or electronic cigarettes.  The nicorette gum made her feel shaky.  I congratulated her on this. Watching salt in her diet.   Past Medical History  Diagnosis Date  . Insulin dependent diabetes mellitus   . Iron deficiency anemia   . History of blood transfusion 1986    "related to C-section"  . Chronic systolic CHF  (congestive heart failure)     a. Dx 05/2014 - echo at [facility] - EF 30-35%, moderate LVH, no rWMA, mild to moderate MR, moderate PH with PA pressure 60-25mmHg, mild to moderate pericardial effusion. EF 30-35% by cath.  Marland Kitchen NICM (nonischemic cardiomyopathy)     a. LHC 06/11/14:  minor nonobstructive CAD with mild irregularities in the LAD less than 10-20%, LCx with mild disease less than 20%, no significant disease in the RCA, EF 30-35%.  . Hypertension   . Tobacco abuse   . Pericardial effusion     a. Mild-moderate pericardial effusion by echo at [facility].  . Abnormal LFTs     a. 05/2014 -  ALT 52, alk phos 130.  . Pulmonary hypertension     a. Moderate PH by echo at [facility].    Past Surgical History  Procedure Laterality Date  . Appendectomy  ~ 2003  . Cesarean section  1986  . Left heart catheterization with coronary angiogram N/A 06/11/2014    Procedure: LEFT HEART CATHETERIZATION WITH CORONARY ANGIOGRAM;  Surgeon: Peter M Swaziland, MD;  Location: [facility] CATH LAB;  Service: Cardiovascular;  Laterality: N/A;     Current Outpatient Prescriptions  Medication Sig Dispense Refill  . aspirin EC 81 MG EC tablet Take 1 tablet (81 mg  total) by mouth daily. 30 tablet 3  . atorvastatin (LIPITOR) 20 MG tablet Take 1 tablet (20 mg total) by mouth every evening. 30 tablet 3  . carvedilol (COREG) 3.125 MG tablet Take 1 tablet (3.125 mg total) by mouth 2 (two) times daily with a meal. 60 tablet 3  . enalapril (VASOTEC) 2.5 MG tablet Take 1 tablet (2.5 mg total) by mouth 2 (two) times daily. 60 tablet 3  . furosemide (LASIX) 20 MG tablet Take 1 tablet (20 mg total) by mouth daily as needed (for weight gain of 3-5 pounds, fluid overload, or shortness of breath). 30 tablet 3  . insulin aspart protamine- aspart (NOVOLOG MIX 70/30) (70-30) 100 UNIT/ML injection Inject 25-35 Units into the skin See admin instructions. 35units in the morning and 25 units in the afternoon    . insulin detemir (LEVEMIR) 100 UNIT/ML  injection Inject into the skin. 25 units in AM and 35 units in PM     No current facility-administered medications for this visit.    Allergies:   Review of patient's allergies indicates no known allergies.    Social History:  The patient  reports that she has been smoking Cigarettes.  She has a 11 pack-year smoking history. She has never used smokeless tobacco. She reports that she does not drink alcohol or use illicit drugs.   Family History:  The patient's family history includes Coronary artery disease in her mother; Diabetes in her mother; Lung cancer in her father; Lupus in her mother; Stroke in her mother.    ROS:  General:no colds or fevers,  weight up from discharge wt but she tells me it has been stable for a couple of weeks. Skin:no rashes or ulcers HEENT:no blurred vision, no congestion CV:see HPI PUL:see HPI GI:no diarrhea constipation or melena, no indigestion GU:no hematuria, no dysuria MS:no joint pain, no claudication Neuro:no syncope, no lightheadedness Endo:+ diabetes glucose is controlled, no thyroid disease  Wt Readings from Last 3 Encounters:  07/11/14 126 lb 8 oz (57.38 kg)  06/11/14 121 lb 4.1 oz (55 kg)     PHYSICAL EXAM: VS:  BP 112/64 mmHg  Pulse 97  Ht $R'5\' 2"'ry$  (1.575 m)  Wt 126 lb 8 oz (57.38 kg)  BMI 23.13 kg/m2 , BMI Body mass index is 23.13 kg/(m^2). General:Pleasant affect, NAD Skin:Warm and dry, brisk capillary refill HEENT:normocephalic, sclera clear, mucus membranes moist, Hard of hearing Neck:supple, no JVD, no bruits  Heart:S1S2 RRR without murmur, gallup, rub or click Lungs:clear without rales, rhonchi, or wheezes MBW:GYKZ, non tender, + BS, do not palpate liver spleen or masses Ext:no lower ext edema, 2+ pedal pulses, 2+ radial pulses Neuro:alert and oriented X3 , MAE, follows commands, + facial symmetry    EKG:  EKG is ordered today. The ekg ordered today demonstrates SR Biatrial enlargement, LVH nonspecific T wave abnormality, TW  inversion V5-6 same at 06/10/14.   Recent Labs: 06/10/2014: ALT 52*; B Natriuretic Peptide 397.0*; Magnesium 1.8; TSH 0.845 06/11/2014: BUN 22; Creatinine 0.94; Hemoglobin 13.9; Platelets 286; Potassium 4.1; Sodium 137    Lipid Panel    Component Value Date/Time   CHOL 153 06/11/2014 0315   TRIG 107 06/11/2014 0315   HDL 28* 06/11/2014 0315   CHOLHDL 5.5 06/11/2014 0315   VLDL 21 06/11/2014 0315   LDLCALC 104* 06/11/2014 0315       Other studies Reviewed: Additional studies/ records that were reviewed today include: cardiac cath and echo.   ASSESSMENT AND PLAN:  1. New onset  acute systolic CHF due to NICM- EF 30-35% with LVH now resolved and euvolemic today.  On ACE and BB. I increased her coreg to 3.125 mg in AM and 6.25 mg in pm.  She will need follow up in 2 weeks and then echo first of May and follow up with Dr. P. Martinique end of May.  She will try to exercise by walking more.  2. Minor nonobstructive CAD by cath 06/11/14- on BB and statin.  3. Hypertension- well controlled, difficult to adjust meds with lower BP will go slowly -but HR 97 so she would benefit with more BB.  4. Ongoing tobacco abuse has decreased to 1-2 cigarettes a day, much improved, she will continue to decrease.  5. Diabetes mellitus, insulin dependent followed by PCP  6. Mild-moderate pericardial effusion by echo at [facility] will recheck in May  7. Mildly abnormal LFTs - ALT 52, alk phos 130- will check CMP  8. Moderate pulm HTN by echo at [place]-BP controlled  Current medicines are reviewed with the patient today.  The patient Has no concerns regarding medicines.  The following changes have been made:  See above Labs/ tests ordered today include:see above  Disposition:   FU:  see above  Signed, Isaiah Serge, NP  07/11/2014 3:50 PM    [facility] [address] [place], [place], [place] Phone: [phone]; Fax: [phone]

## 2014-07-12 LAB — COMPREHENSIVE METABOLIC PANEL
ALK PHOS: 79 U/L (ref 39–117)
ALT: 24 U/L (ref 0–35)
AST: 24 U/L (ref 0–37)
Albumin: 4 g/dL (ref 3.5–5.2)
BILIRUBIN TOTAL: 0.7 mg/dL (ref 0.2–1.2)
BUN: 17 mg/dL (ref 6–23)
CO2: 27 mEq/L (ref 19–32)
CREATININE: 0.96 mg/dL (ref 0.50–1.10)
Calcium: 9.8 mg/dL (ref 8.4–10.5)
Chloride: 102 mEq/L (ref 96–112)
Glucose, Bld: 247 mg/dL — ABNORMAL HIGH (ref 70–99)
Potassium: 4.1 mEq/L (ref 3.5–5.3)
Sodium: 136 mEq/L (ref 135–145)
Total Protein: 7.3 g/dL (ref 6.0–8.3)

## 2014-07-17 ENCOUNTER — Inpatient Hospital Stay (HOSPITAL_COMMUNITY): Admission: RE | Admit: 2014-07-17 | Payer: BLUE CROSS/BLUE SHIELD | Source: Ambulatory Visit

## 2014-07-31 ENCOUNTER — Ambulatory Visit: Payer: BLUE CROSS/BLUE SHIELD | Admitting: Cardiology

## 2014-09-08 ENCOUNTER — Other Ambulatory Visit: Payer: Self-pay | Admitting: Physician Assistant

## 2014-09-09 NOTE — Telephone Encounter (Signed)
Ok to refill? Please advise. Thanks, MI 

## 2014-09-10 NOTE — Telephone Encounter (Signed)
Patient called no answer.Left message on personal voice mail receive a message for aspirin refill.Refill sent to pharmacy.Advised to call back needs to schedule follow up appointment with Dr.Jordan.

## 2014-09-14 ENCOUNTER — Other Ambulatory Visit: Payer: Self-pay | Admitting: Physician Assistant

## 2015-05-02 ENCOUNTER — Other Ambulatory Visit: Payer: Self-pay | Admitting: Physician Assistant

## 2015-05-24 ENCOUNTER — Other Ambulatory Visit: Payer: Self-pay | Admitting: Physician Assistant

## 2015-06-12 ENCOUNTER — Ambulatory Visit: Payer: BLUE CROSS/BLUE SHIELD | Admitting: Physician Assistant

## 2015-06-26 ENCOUNTER — Emergency Department (HOSPITAL_COMMUNITY): Payer: BLUE CROSS/BLUE SHIELD

## 2015-06-26 ENCOUNTER — Inpatient Hospital Stay (HOSPITAL_COMMUNITY)
Admission: EM | Admit: 2015-06-26 | Discharge: 2015-06-29 | DRG: 638 | Disposition: A | Discharge status: Home / Self Care | Payer: BLUE CROSS/BLUE SHIELD | Attending: Family Medicine | Admitting: Family Medicine

## 2015-06-26 ENCOUNTER — Encounter (HOSPITAL_COMMUNITY): Payer: Self-pay | Admitting: *Deleted

## 2015-06-26 DIAGNOSIS — I2510 Atherosclerotic heart disease of native coronary artery without angina pectoris: Secondary | ICD-10-CM | POA: Diagnosis present

## 2015-06-26 DIAGNOSIS — I429 Cardiomyopathy, unspecified: Secondary | ICD-10-CM | POA: Diagnosis present

## 2015-06-26 DIAGNOSIS — Z794 Long term (current) use of insulin: Secondary | ICD-10-CM

## 2015-06-26 DIAGNOSIS — E1310 Other specified diabetes mellitus with ketoacidosis without coma: Secondary | ICD-10-CM | POA: Diagnosis present

## 2015-06-26 DIAGNOSIS — H9190 Unspecified hearing loss, unspecified ear: Secondary | ICD-10-CM | POA: Diagnosis present

## 2015-06-26 DIAGNOSIS — E860 Dehydration: Secondary | ICD-10-CM | POA: Diagnosis present

## 2015-06-26 DIAGNOSIS — R739 Hyperglycemia, unspecified: Secondary | ICD-10-CM | POA: Diagnosis present

## 2015-06-26 DIAGNOSIS — I10 Essential (primary) hypertension: Secondary | ICD-10-CM | POA: Diagnosis present

## 2015-06-26 DIAGNOSIS — N179 Acute kidney failure, unspecified: Secondary | ICD-10-CM | POA: Diagnosis present

## 2015-06-26 DIAGNOSIS — E0810 Diabetes mellitus due to underlying condition with ketoacidosis without coma: Secondary | ICD-10-CM

## 2015-06-26 DIAGNOSIS — I5022 Chronic systolic (congestive) heart failure: Secondary | ICD-10-CM | POA: Diagnosis present

## 2015-06-26 DIAGNOSIS — D649 Anemia, unspecified: Secondary | ICD-10-CM | POA: Diagnosis present

## 2015-06-26 DIAGNOSIS — E1110 Type 2 diabetes mellitus with ketoacidosis without coma: Secondary | ICD-10-CM | POA: Diagnosis present

## 2015-06-26 DIAGNOSIS — Z66 Do not resuscitate: Secondary | ICD-10-CM | POA: Diagnosis present

## 2015-06-26 DIAGNOSIS — I272 Other secondary pulmonary hypertension: Secondary | ICD-10-CM | POA: Diagnosis present

## 2015-06-26 DIAGNOSIS — Z87891 Personal history of nicotine dependence: Secondary | ICD-10-CM | POA: Diagnosis not present

## 2015-06-26 LAB — URINALYSIS, ROUTINE W REFLEX MICROSCOPIC
BILIRUBIN URINE: NEGATIVE
Glucose, UA: 500 mg/dL — AB
Hgb urine dipstick: NEGATIVE
KETONES UR: NEGATIVE mg/dL
Leukocytes, UA: NEGATIVE
NITRITE: NEGATIVE
Protein, ur: NEGATIVE mg/dL
SPECIFIC GRAVITY, URINE: 1.012 (ref 1.005–1.030)
pH: 7 (ref 5.0–8.0)

## 2015-06-26 LAB — CBG MONITORING, ED
GLUCOSE-CAPILLARY: 167 mg/dL — AB (ref 65–99)
GLUCOSE-CAPILLARY: 221 mg/dL — AB (ref 65–99)
GLUCOSE-CAPILLARY: 315 mg/dL — AB (ref 65–99)
Glucose-Capillary: 551 mg/dL (ref 65–99)

## 2015-06-26 LAB — CBC WITH DIFFERENTIAL/PLATELET
BASOS PCT: 0 %
Basophils Absolute: 0 10*3/uL (ref 0.0–0.1)
EOS ABS: 0 10*3/uL (ref 0.0–0.7)
EOS PCT: 0 %
HCT: 48.3 % — ABNORMAL HIGH (ref 36.0–46.0)
Hemoglobin: 17.3 g/dL — ABNORMAL HIGH (ref 12.0–15.0)
LYMPHS ABS: 2.5 10*3/uL (ref 0.7–4.0)
Lymphocytes Relative: 29 %
MCH: 32.3 pg (ref 26.0–34.0)
MCHC: 35.8 g/dL (ref 30.0–36.0)
MCV: 90.3 fL (ref 78.0–100.0)
Monocytes Absolute: 0.6 10*3/uL (ref 0.1–1.0)
Monocytes Relative: 7 %
Neutro Abs: 5.6 10*3/uL (ref 1.7–7.7)
Neutrophils Relative %: 64 %
PLATELETS: 268 10*3/uL (ref 150–400)
RBC: 5.35 MIL/uL — AB (ref 3.87–5.11)
RDW: 13.6 % (ref 11.5–15.5)
WBC: 8.8 10*3/uL (ref 4.0–10.5)

## 2015-06-26 LAB — HEPATIC FUNCTION PANEL
ALBUMIN: 3.6 g/dL (ref 3.5–5.0)
ALT: 19 U/L (ref 14–54)
AST: 26 U/L (ref 15–41)
Alkaline Phosphatase: 153 U/L — ABNORMAL HIGH (ref 38–126)
BILIRUBIN DIRECT: 0.4 mg/dL (ref 0.1–0.5)
BILIRUBIN TOTAL: 1.2 mg/dL (ref 0.3–1.2)
Indirect Bilirubin: 0.8 mg/dL (ref 0.3–0.9)
Total Protein: 7.6 g/dL (ref 6.5–8.1)

## 2015-06-26 LAB — BASIC METABOLIC PANEL
Anion gap: 20 — ABNORMAL HIGH (ref 5–15)
BUN: 75 mg/dL — AB (ref 6–20)
CALCIUM: 10.1 mg/dL (ref 8.9–10.3)
CHLORIDE: 89 mmol/L — AB (ref 101–111)
CO2: 18 mmol/L — ABNORMAL LOW (ref 22–32)
CREATININE: 2.66 mg/dL — AB (ref 0.44–1.00)
GFR, EST AFRICAN AMERICAN: 22 mL/min — AB (ref >60)
GFR, EST NON AFRICAN AMERICAN: 19 mL/min — AB (ref >60)
Glucose, Bld: 404 mg/dL — ABNORMAL HIGH (ref 65–99)
Potassium: 4.9 mmol/L (ref 3.5–5.1)
Sodium: 127 mmol/L — ABNORMAL LOW (ref 135–145)

## 2015-06-26 LAB — TROPONIN I

## 2015-06-26 LAB — BRAIN NATRIURETIC PEPTIDE: B Natriuretic Peptide: 33.7 pg/mL (ref 0.0–100.0)

## 2015-06-26 IMAGING — DX DG CHEST 2V
2 series · 2 of 2 positions shown · non-contrast
Comparison: [DATE].

CLINICAL DATA: Chest pain.

EXAM:
CHEST  2 VIEW

[chest lat]
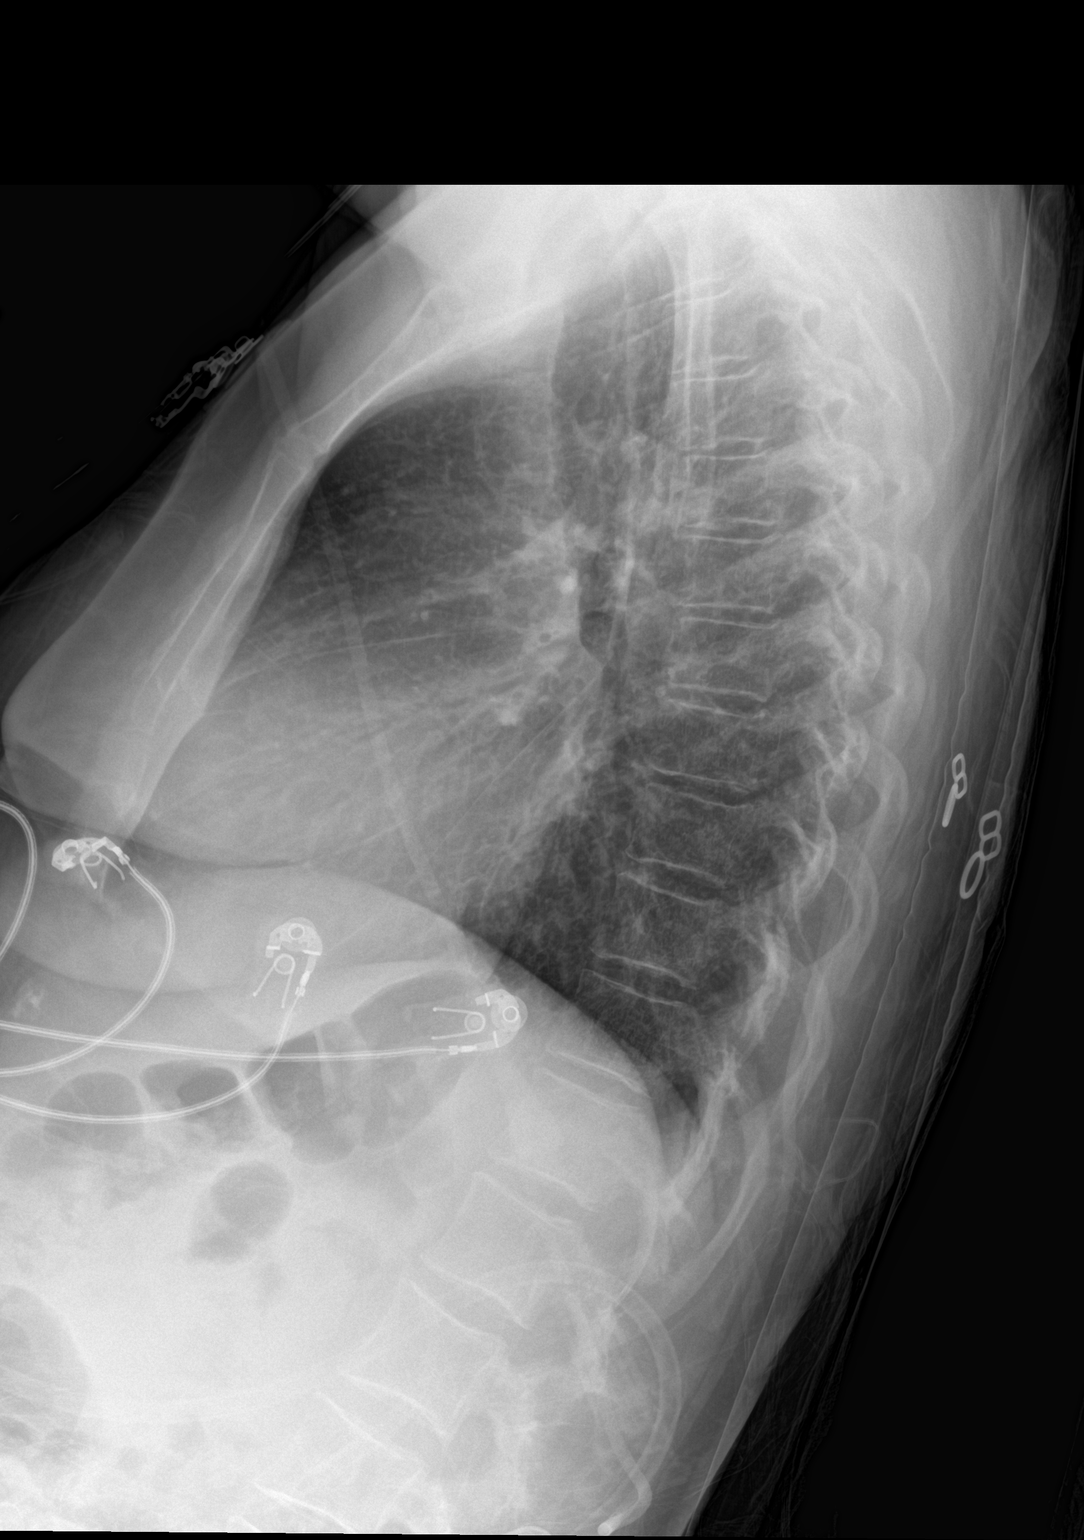

[chest ap]
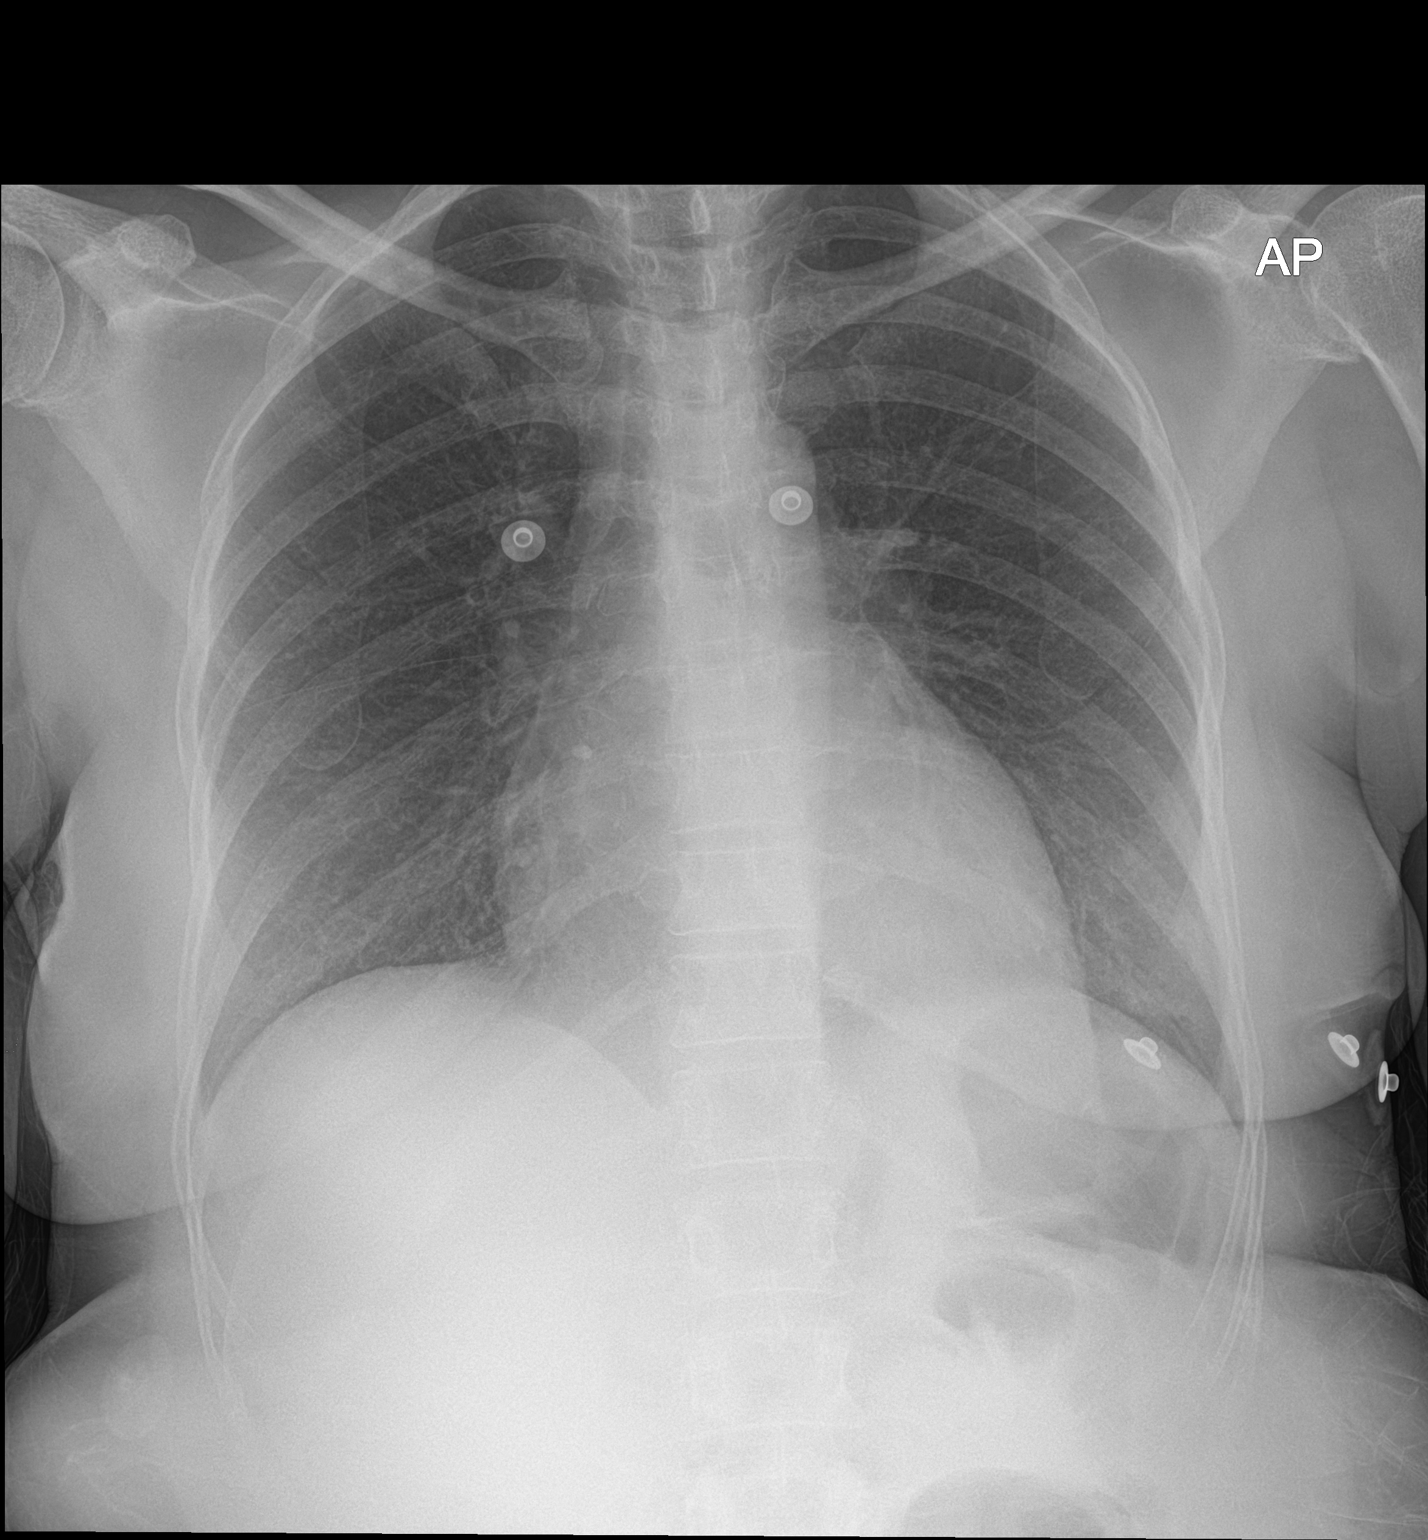

[2 of 2 positions shown; findings below may reference images not displayed]

FINDINGS: The heart size and mediastinal contours are within normal limits.
Both lungs are clear. No pneumothorax or pleural effusion is noted.
The visualized skeletal structures are unremarkable.
IMPRESSION: No active cardiopulmonary disease.

## 2015-06-26 MED ORDER — SODIUM CHLORIDE 0.9 % IV BOLUS (SEPSIS): 1000.0000 mL | Freq: Once | INTRAVENOUS | Status: DC

## 2015-06-26 MED ORDER — SODIUM CHLORIDE 0.9 % IV SOLN
INTRAVENOUS | Status: DC
  Administered 2015-06-26: via INTRAVENOUS

## 2015-06-26 MED ORDER — ENOXAPARIN SODIUM 30 MG/0.3ML ~~LOC~~ SOLN: 30.0000 mg | SUBCUTANEOUS | Status: DC

## 2015-06-26 MED ORDER — POTASSIUM CHLORIDE 10 MEQ/100ML IV SOLN
10.0000 meq | INTRAVENOUS | Status: AC
  Administered 2015-06-26 – 2015-06-27 (×2): 10 meq via INTRAVENOUS
  Filled 2015-06-26 (×2): qty 100

## 2015-06-26 MED ORDER — SODIUM CHLORIDE 0.9 % IV SOLN
INTRAVENOUS | Status: DC
  Administered 2015-06-26: 1.6 [IU]/h via INTRAVENOUS
  Filled 2015-06-26: qty 2.5

## 2015-06-26 MED ORDER — SODIUM CHLORIDE 0.9 % IV BOLUS (SEPSIS)
250.0000 mL | Freq: Once | INTRAVENOUS | Status: AC
  Administered 2015-06-26: 250 mL via INTRAVENOUS

## 2015-06-26 MED ORDER — SODIUM CHLORIDE 0.9 % IV SOLN: Freq: Once | INTRAVENOUS | Status: DC

## 2015-06-26 MED ORDER — DEXTROSE-NACL 5-0.45 % IV SOLN: INTRAVENOUS | Status: AC

## 2015-06-26 MED ORDER — SODIUM CHLORIDE 0.9 % IV SOLN
INTRAVENOUS | Status: AC
  Filled 2015-06-26: qty 2.5

## 2015-06-26 MED ORDER — SODIUM CHLORIDE 0.9 % IV BOLUS (SEPSIS)
1000.0000 mL | Freq: Once | INTRAVENOUS | Status: AC
  Administered 2015-06-26: 1000 mL via INTRAVENOUS

## 2015-06-26 MED ORDER — SODIUM CHLORIDE 0.9 % IV SOLN: INTRAVENOUS | Status: DC

## 2015-06-26 MED ORDER — DEXTROSE-NACL 5-0.45 % IV SOLN
INTRAVENOUS | Status: DC
Start: 2015-06-26 — End: 2015-06-26
  Administered 2015-06-26: 22:00:00 via INTRAVENOUS

## 2015-06-26 NOTE — ED Notes (Signed)
Pt arrives from [facility] via [facility]. Pt was at PCP for f/u after being d/c from [facility] on Saturday. Pt states they changed her insulin and lasix dosages in the hospital and at PCP cbg was 580, pt given 25 U of Novolog. Pt states she began feeling nauseous and had an episode of vomiting with left sided cp. Pt states the cp and n/v have since decreased.

## 2015-06-26 NOTE — ED Notes (Signed)
Called phlebotomy to draw blood.  

## 2015-06-26 NOTE — H&P (Signed)
[facility] Admission History and Physical Service Pager: [phone]  Patient name: Christine Benitez Medical record number: [id number] Date of birth: 12/14/1960 Age: 55 y.o. Gender: female  Primary Care Provider: Leonides Sake, MD Consultants: None Code Status: DNR  Chief Complaint: DKA  Assessment and Plan: KYLE STANSELL is a 55 y.o. female presenting with DKA. PMH is significant for HTN, NICM, Pulmonary HTN, DM, HF, Tobacco Abuse.  # DKA: CBGs [id number] in ED. AG 20. Sodium 127 (corrected to 132), potassium 4.9, CO2 18. Troponin negative. No signs of infection with normal WBC (8.8), normal urinalysis (note lack of ketones, glucose 500), and CXR without active cardiopulmonary disease. Two 250cc boluses of NS given in ED. - Admit to [facility] Medicine Teaching Service in SDU, Dr. Ardelia Mems attending. - NPO - BMP q4hr - A1C - NS'@250'$  then transition to 1/2NS with D5 '@250'$ /hr for 4 hours. Then transition to 150cc/hr. Fluids discussed with pharmacy given low EF. Monitor closely for Pulmonary Edema. - Discussed with Cardiology. Recommended caution with fluids and discussion with CCM. - Discussed with CCM. Recommended admission to SDU. Agree with plan of fluids at 250cc x4 hr. Then recommend re-evaluating for shortness of breath and fluid status and proceeding to 150cc/hr if normal. Will officially consult if pulmonary edema occurs with fluids. - Replete potassium - Insulin Drip - Attempt to obtain records from hospital in [place], [place] concerning prior admission  # Acute Kidney Injury: Most likely prerenal given dehydrated state of DKA. Creatinine 2.66 at admission. Baseline 0.9. - Continue to monitor on BMP - Fluids per above  # Anemia: Hemoglobin 17.3. - Monitor on AM CBC  # HFrEF: First diagnosed in 05/2014. Recent Echo at hospitalization in [place] showed EF 30-35%, moderate LVH, mild to moderate MR, moderate PH (PA 60-65%), mild to moderate pericardial  effusion; catheterization also showed EF 30-35%. History of NICM also noted. Former Smoker. Home medications include Coreg, Enalapril, Lasix. - Monitor closely for Pulmonary Edema given fluids. - Consider Cardiology consult - Holding medications due to hypotension. Restart when able.  # HTN: Normotensive with BP 103/72. - Holding medication due to hypotension. Restart when able.  FEN/GI: NPO. D5 1/2 NS '@250'$ . Prophylaxis: Lovenox, Renally Adjusted  Disposition: Home  History of Present Illness:  Christine Benitez is a 55 y.o. female presenting with DKA. Initially went to [facility] and reported nausea, vomiting, and left sided chest pain. CBG noted to be 575 at office and insulin given. Was sent to ED for workup of chest pain and hyperglycemia.  In ED, patient reports four day history of upper abdominal pain, nausea, decreased appetite, and decreased energy. Also reports recent hospitalization at [facility] for acute CHF, where they decreased her insulin regmine significantly. States before hospitalization she was taking Novolin 70/30 35 units in the morning and 15 units in the afternoon, but was discharged on Novolin 70/30 15 units in the morning and 10 units in the afternoon. Does not recall any prior hospitalizations for diabetes. Feels much better since arrival to ED with fluids. Denies history of smoking, alcohol use, illicit drug use.  Review Of Systems: Per HPI Otherwise the remainder of the systems were negative.  Patient Active Problem List   Diagnosis Date Noted  . Abnormal LFTs   . Pericardial effusion   . Hypertension   . NICM (nonischemic cardiomyopathy) ([place])   . Pulmonary hypertension ([place])   . Acute systolic heart failure ([place]) 06/10/2014  . Tobacco abuse 06/10/2014  .  Diabetes mellitus ([place]) 06/10/2014  . Family history of premature CAD 06/10/2014    Past Medical History: Past Medical History  Diagnosis Date  . Insulin dependent diabetes mellitus  ([place])   . Iron deficiency anemia   . History of blood transfusion 1986    "related to C-section"  . Chronic systolic CHF (congestive heart failure) ([place])     a. Dx 05/2014 - echo at [facility] - EF 30-35%, moderate LVH, no rWMA, mild to moderate MR, moderate PH with PA pressure 60-23mHg, mild to moderate pericardial effusion. EF 30-35% by cath.  .Marland KitchenNICM (nonischemic cardiomyopathy) ([place]     a. [place]:  minor nonobstructive CAD with mild irregularities in the LAD less than 10-20%, LCx with mild disease less than 20%, no significant disease in the RCA, EF 30-35%.  . Hypertension   . Tobacco abuse   . Pericardial effusion     a. [facility]  . Abnormal LFTs     a. 05/2014 -  ALT 52, alk phos 130.  . Pulmonary hypertension ([place]     a. [facility]    Past Surgical History: Past Surgical History  Procedure Laterality Date  . Appendectomy  ~ 2003  . Cesarean section  1986  . Left heart catheterization with coronary angiogram N/A 06/11/2014    Procedure: LEFT HEART CATHETERIZATION WITH CORONARY ANGIOGRAM;  Surgeon: Peter M JMartinique MD;  [facility];  Service: Cardiovascular;  Laterality: N/A;    Social History: Social History  Substance Use Topics  . Smoking status: Former Smoker -- 0.50 packs/day for 22 years    Types: Cigarettes    Quit date: 03/26/2015  . Smokeless tobacco: Never Used  . Alcohol Use: No   Please also refer to relevant sections of EMR.  Family History: Family History  Problem Relation Age of Onset  . Coronary artery disease Mother   . Diabetes Mother   . Lupus Mother   . Stroke Mother   . Lung cancer Father    Allergies and Medications: No Known Allergies No current facility-administered medications on file prior to encounter.   Current Outpatient Prescriptions on File Prior to Encounter  Medication Sig Dispense Refill  . aspirin 81 MG EC tablet TAKE 1 TABLET (81 MG TOTAL) BY MOUTH  DAILY. 30 tablet 3  . atorvastatin (LIPITOR) 20 MG tablet TAKE 1 TABLET (20 MG TOTAL) BY MOUTH EVERY EVENING. 30 tablet 0  . carvedilol (COREG) 3.125 MG tablet Take 1 tablet in the Morning and 2 tablets in the Evening 90 tablet 4  . enalapril (VASOTEC) 2.5 MG tablet TAKE 1 TABLET (2.5 MG TOTAL) BY MOUTH 2 (TWO) TIMES DAILY. 60 tablet 0  . furosemide (LASIX) 20 MG tablet Take 1 tablet (20 mg total) by mouth daily. Appointment needed for future refills 30 tablet 0  . insulin detemir (LEVEMIR) 100 UNIT/ML injection Inject into the skin. Reported on 06/26/2015      Objective: BP 103/72 mmHg  Pulse 80  Temp(Src) 97.9 F (36.6 C) (Oral)  Resp 21  SpO2 100% Exam: General: 89yofemale resting comfortably in no apparent distress Eyes: PERRLA, EOM intact ENTM: Dry mucous membranes, breath smells of ketones, decreased hearing bilaterally (baseline per patient) Neck: Supple, no lymphadenopathy Cardiovascular: S1 and S2 noted, no murmurs, regular rate and rhythm Respiratory: Clear to auscultation bilaterally, no crackles, no increased work of breathing Abdomen: Bowel sounds noted, soft and nondistended, denies tenderness MSK: No edema  noted, muscle strength 5/5 in upper and lower extremities Skin: Dry, warm, no rashes Neuro: No focal deficits, sensation intact Psych: Alert and oriented x3  Labs and Imaging: CBC BMET   Recent Labs Lab 06/26/15 2002  WBC 8.8  HGB 17.3*  HCT 48.3*  PLT 268    Recent Labs Lab 06/26/15 2002  NA 127*  K 4.9  CL 89*  CO2 18*  BUN 75*  CREATININE 2.66*  GLUCOSE 404*  CALCIUM 10.1  AG 20 BNP 33.7 Troponin <0.03   Urinalysis    Component Value Date/Time   COLORURINE YELLOW 06/26/2015 1933   APPEARANCEUR CLEAR 06/26/2015 1933   LABSPEC 1.012 06/26/2015 1933   PHURINE 7.0 06/26/2015 1933   GLUCOSEU 500* 06/26/2015 1933   HGBUR NEGATIVE 06/26/2015 [place] NEGATIVE 06/26/2015 [place] 06/26/2015 1933   PROTEINUR  NEGATIVE 06/26/2015 1933   NITRITE NEGATIVE 06/26/2015 [place] NEGATIVE 06/26/2015 1933   Dg Chest 2 View  06/26/2015  CLINICAL DATA:  Chest pain. EXAM: CHEST  2 VIEW COMPARISON:  June 16, 2015. FINDINGS: The heart size and mediastinal contours are within normal limits. Both lungs are clear. No pneumothorax or pleural effusion is noted. The visualized skeletal structures are unremarkable. IMPRESSION: No active cardiopulmonary disease. Electronically Signed   By: Marijo Conception, M.D.   On: 06/26/2015 19:32   Lorna Few, DO 06/26/2015, 9:39 PM PGY-2, [place] Intern pager: [phone], text pages welcome

## 2015-06-26 NOTE — ED Notes (Signed)
CBG 315. RN notified.

## 2015-06-26 NOTE — ED Provider Notes (Signed)
CSN: [id number]     Arrival date & time 06/26/15  1801 History   First MD Initiated Contact with Patient 06/26/15 1806     Chief Complaint  Patient presents with  . Chest Pain     (Consider location/radiation/quality/duration/timing/severity/associated sxs/prior Treatment) The history is provided by the patient.     Patient with hx CHF, nonischemic cardiomyopathy, pericardial effusion, pulmonary hypertension, insulin dependent diabetes, p/w 4 days of upper abdominal pain, nausea, decreased appetite, decreased energy.  She was admitted to [facility] last week for acute CHF.  States at the time they decreased her insulin dosage significantly.  Since then she has felt fatigued, nauseated, decreased appetite, decreased fluid intake.  At PCP office today she developed pain across her lower chest/upper abdomen and nausea.  Has had cough productive of clear sputum.  Denies SOB change from baseline.  Has had lower abdominal pain for several days.  Denies fevers. At doctor's office, her blood sugar was found to be 575 (per pt).  Was given insulin at PCP prior to transport to ED.   Level V caveat for hard of hearing, difficult communication.    Past Medical History  Diagnosis Date  . Insulin dependent diabetes mellitus ([place])   . Iron deficiency anemia   . History of blood transfusion 1986    "related to C-section"  . Chronic systolic CHF (congestive heart failure) ([place])     a. Dx 05/2014 - echo at [facility] - EF 30-35%, moderate LVH, no rWMA, mild to moderate MR, moderate PH with PA pressure 60-25mHg, mild to moderate pericardial effusion. EF 30-35% by cath.  .Marland [place]ICM ([place]mic [place]     [place]  minor nonobstructive CAD with mild irregularities in the LAD less than 10-20%, LCx with mild disease less than 20%, no significant disease in the RCA, EF 30-35%.  . Hypertension   . Tobacco abuse   . Pericardial effusion     a. Mild-[facility]  . Abnormal LFTs     a. 05/2014 -  ALT 52, alk phos 130.  . [place]     [facility]. [facility]   Past Surgical History  Procedure Laterality Date  . Appendectomy  ~ 2003  . Cesarean section  1986  . Left heart catheterization with coronary angiogram N/A 06/11/2014    Procedure: LEFT HEART CATHETERIZATION WITH CORONARY ANGIOGRAM;  Surgeon: Peter M JMartinique MD;  [facility];  Service: Cardiovascular;  Laterality: N/A;   Family History  Problem Relation Age of Onset  . Coronary artery disease Mother   . Diabetes Mother   . Lupus Mother   . Stroke Mother   . Lung cancer Father    Social History  Substance Use Topics  . Smoking status: Former Smoker -- 0.50 packs/day for 22 years    Types: Cigarettes    Quit date: 03/26/2015  . Smokeless tobacco: Never Used  . Alcohol Use: No   OB History    No data available     Review of Systems  Unable to perform ROS: Other      Allergies  Review of patient's allergies indicates no known allergies.  Home Medications   Prior to Admission medications   Medication Sig Start Date End Date Taking? Authorizing Provider  aspirin 81 MG EC tablet TAKE 1 TABLET (81 MG TOTAL) BY MOUTH DAILY. 09/10/14   Peter M JMartinique MD  atorvastatin (LIPITOR) 20 MG tablet  TAKE 1 TABLET (20 MG TOTAL) BY MOUTH EVERY EVENING. 09/16/14   Peter M Martinique, MD  carvedilol (COREG) 3.125 MG tablet Take 1 tablet in the Morning and 2 tablets in the Evening 07/11/14   Isaiah Serge, NP  enalapril (VASOTEC) 2.5 MG tablet TAKE 1 TABLET (2.5 MG TOTAL) BY MOUTH 2 (TWO) TIMES DAILY. 09/16/14   Peter M Martinique, MD  furosemide (LASIX) 20 MG tablet Take 1 tablet (20 mg total) by mouth daily. Appointment needed for future refills 05/26/15   Isaiah Serge, NP  insulin aspart protamine- aspart (NOVOLOG MIX 70/30) (70-30) 100 UNIT/ML injection Inject 25-35 Units into the skin See admin instructions. 35units in the morning and 25 units in the  afternoon    Historical Provider, MD  insulin detemir (LEVEMIR) 100 UNIT/ML injection Inject into the skin. 25 units in AM and 35 units in PM    Historical Provider, MD   BP 94/72 mmHg  Pulse 77  Temp(Src) 97.9 F (36.6 C) (Oral)  Resp 21  SpO2 100% Physical Exam  Constitutional: She appears well-developed and well-nourished. No distress.  HENT:  Head: Normocephalic and atraumatic.  Eyes: Conjunctivae are normal.  Neck: Neck supple.  Cardiovascular: Normal rate and regular rhythm.   Pulmonary/Chest: Effort normal and breath sounds normal. No respiratory distress. She has no wheezes. She has no rales.  Abdominal: Soft. She exhibits no distension. There is tenderness in the right upper quadrant and epigastric area. There is no rebound and no guarding.  Neurological: She is alert.  Skin: She is not diaphoretic.  Nursing note and vitals reviewed.   ED Course  Procedures (including critical care time) Labs Review Labs Reviewed  CBG MONITORING, ED - Abnormal; Notable for the following:    Glucose-Capillary 551 (*)    All other components within normal limits  URINE CULTURE  BASIC METABOLIC PANEL  CBC WITH DIFFERENTIAL/PLATELET  TROPONIN I  BRAIN NATRIURETIC PEPTIDE  HEPATIC FUNCTION PANEL  URINALYSIS, ROUTINE W REFLEX MICROSCOPIC (NOT AT [facility])  CBG MONITORING, ED    Imaging Review Dg Chest 2 View  06/26/2015  CLINICAL DATA:  Chest pain. EXAM: CHEST  2 VIEW COMPARISON:  June 16, 2015. FINDINGS: The heart size and mediastinal contours are within normal limits. Both lungs are clear. No pneumothorax or pleural effusion is noted. The visualized skeletal structures are unremarkable. IMPRESSION: No active cardiopulmonary disease. Electronically Signed   By: Marijo Conception, M.D.   On: 06/26/2015 19:32   I have personally reviewed and evaluated these images and lab results as part of my medical decision-making.   EKG Interpretation   Date/Time:  Thursday June 26 2015  18:10:30 EST Ventricular Rate:  74 PR Interval:  164 QRS Duration: 86 QT Interval:  379 QTC Calculation: 420 R Axis:   67 Text Interpretation:  Sinus rhythm Biatrial enlargement LVH with secondary  repolarization abnormality stable inferior and lateral T wave inversions  Confirmed by Wyvonnia Dusky  MD, STEPHEN [id number]) on 06/26/2015 6:14:08 PM      MDM   Final diagnoses:  Hyperglycemia   Patient with hx nonischemic CAD, CHF (EF 30-35%), insulin dependent diabetes with recent admission to outside hospital for acute CHF p/w fatigue, decreased appetite, abdominal and/or chest pain, and nausea.  Blood sugar markedly elevated at PCP office today.  Pt notes insulin dosage was decreased at last admission.  History somewhat unclear as patient is hard of hearing and does not always answer questions.  EKG, CXR, labs, UA, IVF ordered.  Insulin given at PCP office prior to transport here, therefore not ordered initially in ED.  Patient signed out to Delsa Grana, PA-C, at change of shift pending remainder of workup and reevaluation.      Clayton Bibles, PA-C 06/26/15 2019  Ezequiel Essex, MD 06/27/15 [phone]

## 2015-06-26 NOTE — ED Notes (Signed)
CBG 221. RN notified.

## 2015-06-26 NOTE — ED Notes (Signed)
Latori: [phone] Daughter

## 2015-06-26 NOTE — ED Notes (Signed)
CBG: 551 °

## 2015-06-27 DIAGNOSIS — I10 Essential (primary) hypertension: Secondary | ICD-10-CM | POA: Insufficient documentation

## 2015-06-27 DIAGNOSIS — E0810 Diabetes mellitus due to underlying condition with ketoacidosis without coma: Secondary | ICD-10-CM | POA: Insufficient documentation

## 2015-06-27 DIAGNOSIS — R739 Hyperglycemia, unspecified: Secondary | ICD-10-CM | POA: Insufficient documentation

## 2015-06-27 DIAGNOSIS — I5022 Chronic systolic (congestive) heart failure: Secondary | ICD-10-CM | POA: Insufficient documentation

## 2015-06-27 DIAGNOSIS — E1310 Other specified diabetes mellitus with ketoacidosis without coma: Principal | ICD-10-CM

## 2015-06-27 LAB — GLUCOSE, CAPILLARY
GLUCOSE-CAPILLARY: 167 mg/dL — AB (ref 65–99)
GLUCOSE-CAPILLARY: 166 mg/dL — AB (ref 65–99)
GLUCOSE-CAPILLARY: 184 mg/dL — AB (ref 65–99)
GLUCOSE-CAPILLARY: 226 mg/dL — AB (ref 65–99)
Glucose-Capillary: 141 mg/dL — ABNORMAL HIGH (ref 65–99)
Glucose-Capillary: 142 mg/dL — ABNORMAL HIGH (ref 65–99)
Glucose-Capillary: 126 mg/dL — ABNORMAL HIGH (ref 65–99)
Glucose-Capillary: 377 mg/dL — ABNORMAL HIGH (ref 65–99)

## 2015-06-27 LAB — CBC
HEMATOCRIT: 45.9 % (ref 36.0–46.0)
Hemoglobin: 16.2 g/dL — ABNORMAL HIGH (ref 12.0–15.0)
MCH: 32.1 pg (ref 26.0–34.0)
MCHC: 35.3 g/dL (ref 30.0–36.0)
MCV: 91.1 fL (ref 78.0–100.0)
Platelets: 268 10*3/uL (ref 150–400)
RBC: 5.04 MIL/uL (ref 3.87–5.11)
RDW: 14 % (ref 11.5–15.5)
WBC: 7 10*3/uL (ref 4.0–10.5)

## 2015-06-27 LAB — BASIC METABOLIC PANEL
ANION GAP: 15 (ref 5–15)
Anion gap: 13 (ref 5–15)
Anion gap: 11 (ref 5–15)
Anion gap: 11 (ref 5–15)
BUN: 67 mg/dL — ABNORMAL HIGH (ref 6–20)
BUN: 62 mg/dL — AB (ref 6–20)
BUN: 54 mg/dL — ABNORMAL HIGH (ref 6–20)
BUN: 49 mg/dL — AB (ref 6–20)
CALCIUM: 9.2 mg/dL (ref 8.9–10.3)
CALCIUM: 8.9 mg/dL (ref 8.9–10.3)
CALCIUM: 8.9 mg/dL (ref 8.9–10.3)
CHLORIDE: 98 mmol/L — AB (ref 101–111)
CHLORIDE: 96 mmol/L — AB (ref 101–111)
CO2: 20 mmol/L — ABNORMAL LOW (ref 22–32)
CO2: 23 mmol/L (ref 22–32)
CO2: 20 mmol/L — AB (ref 22–32)
CO2: 23 mmol/L (ref 22–32)
CREATININE: 1.86 mg/dL — AB (ref 0.44–1.00)
CREATININE: 1.52 mg/dL — AB (ref 0.44–1.00)
Calcium: 8.9 mg/dL (ref 8.9–10.3)
Chloride: 99 mmol/L — ABNORMAL LOW (ref 101–111)
Chloride: 97 mmol/L — ABNORMAL LOW (ref 101–111)
Creatinine, Ser: 1.96 mg/dL — ABNORMAL HIGH (ref 0.44–1.00)
Creatinine, Ser: 1.55 mg/dL — ABNORMAL HIGH (ref 0.44–1.00)
GFR calc Af Amer: 32 mL/min — ABNORMAL LOW (ref >60)
GFR calc non Af Amer: 28 mL/min — ABNORMAL LOW (ref >60)
GFR calc non Af Amer: 30 mL/min — ABNORMAL LOW (ref >60)
GFR calc non Af Amer: 37 mL/min — ABNORMAL LOW (ref >60)
GFR calc non Af Amer: 38 mL/min — ABNORMAL LOW (ref >60)
GFR, EST AFRICAN AMERICAN: 35 mL/min — AB (ref >60)
GFR, EST AFRICAN AMERICAN: 43 mL/min — AB (ref >60)
GFR, EST AFRICAN AMERICAN: 44 mL/min — AB (ref >60)
GLUCOSE: 228 mg/dL — AB (ref 65–99)
Glucose, Bld: 176 mg/dL — ABNORMAL HIGH (ref 65–99)
Glucose, Bld: 354 mg/dL — ABNORMAL HIGH (ref 65–99)
Glucose, Bld: 386 mg/dL — ABNORMAL HIGH (ref 65–99)
POTASSIUM: 5 mmol/L (ref 3.5–5.1)
Potassium: 4.3 mmol/L (ref 3.5–5.1)
Potassium: 4.4 mmol/L (ref 3.5–5.1)
Potassium: 4.6 mmol/L (ref 3.5–5.1)
SODIUM: 133 mmol/L — AB (ref 135–145)
SODIUM: 131 mmol/L — AB (ref 135–145)
SODIUM: 131 mmol/L — AB (ref 135–145)
Sodium: 131 mmol/L — ABNORMAL LOW (ref 135–145)

## 2015-06-27 LAB — CBG MONITORING, ED
GLUCOSE-CAPILLARY: 205 mg/dL — AB (ref 65–99)
GLUCOSE-CAPILLARY: 235 mg/dL — AB (ref 65–99)
Glucose-Capillary: 165 mg/dL — ABNORMAL HIGH (ref 65–99)
Glucose-Capillary: 210 mg/dL — ABNORMAL HIGH (ref 65–99)

## 2015-06-27 LAB — URINE CULTURE

## 2015-06-27 LAB — MAGNESIUM
MAGNESIUM: 2.3 mg/dL (ref 1.7–2.4)
Magnesium: 2 mg/dL (ref 1.7–2.4)

## 2015-06-27 LAB — MRSA PCR SCREENING: MRSA BY PCR: NEGATIVE

## 2015-06-27 LAB — TROPONIN I

## 2015-06-27 MED ORDER — DEXTROSE-NACL 5-0.45 % IV SOLN
INTRAVENOUS | Status: AC
  Administered 2015-06-27: 09:00:00 via INTRAVENOUS

## 2015-06-27 MED ORDER — INSULIN ASPART 100 UNIT/ML ~~LOC~~ SOLN
0.0000 [IU] | Freq: Three times a day (TID) | SUBCUTANEOUS | Status: DC
  Administered 2015-06-27: 11 [IU] via SUBCUTANEOUS
  Administered 2015-06-28: 3 [IU] via SUBCUTANEOUS
  Administered 2015-06-28: 11 [IU] via SUBCUTANEOUS
  Administered 2015-06-28: 10 [IU] via SUBCUTANEOUS
  Administered 2015-06-29: 5 [IU] via SUBCUTANEOUS

## 2015-06-27 MED ORDER — INSULIN ASPART 100 UNIT/ML ~~LOC~~ SOLN
0.0000 [IU] | Freq: Three times a day (TID) | SUBCUTANEOUS | Status: DC
  Administered 2015-06-27: 3 [IU] via SUBCUTANEOUS

## 2015-06-27 MED ORDER — ACETAMINOPHEN 325 MG PO TABS
650.0000 mg | ORAL_TABLET | Freq: Four times a day (QID) | ORAL | Status: DC | PRN
  Administered 2015-06-27 – 2015-06-29 (×2): 650 mg via ORAL
  Filled 2015-06-27 (×2): qty 2

## 2015-06-27 MED ORDER — ENOXAPARIN SODIUM 30 MG/0.3ML ~~LOC~~ SOLN
30.0000 mg | Freq: Every day | SUBCUTANEOUS | Status: DC
  Administered 2015-06-27 – 2015-06-28 (×2): 30 mg via SUBCUTANEOUS
  Filled 2015-06-27 (×2): qty 0.3

## 2015-06-27 MED ORDER — DEXTROSE-NACL 5-0.45 % IV SOLN: INTRAVENOUS | Status: DC

## 2015-06-27 MED ORDER — DEXTROSE-NACL 5-0.45 % IV SOLN
INTRAVENOUS | Status: DC
  Administered 2015-06-27: 04:00:00 via INTRAVENOUS
  Filled 2015-06-27 (×3): qty 1000

## 2015-06-27 MED ORDER — INSULIN GLARGINE 100 UNIT/ML ~~LOC~~ SOLN
20.0000 [IU] | Freq: Every day | SUBCUTANEOUS | Status: DC
  Filled 2015-06-27: qty 0.2

## 2015-06-27 MED ORDER — INSULIN ASPART 100 UNIT/ML ~~LOC~~ SOLN
5.0000 [IU] | Freq: Once | SUBCUTANEOUS | Status: AC
  Administered 2015-06-27: 5 [IU] via SUBCUTANEOUS

## 2015-06-27 MED ORDER — GLUCERNA SHAKE PO LIQD
237.0000 mL | Freq: Two times a day (BID) | ORAL | Status: DC
  Administered 2015-06-27 – 2015-06-29 (×4): 237 mL via ORAL
  Filled 2015-06-27: qty 237

## 2015-06-27 MED ORDER — NALOXONE HCL 0.4 MG/ML IJ SOLN: 0.4000 mg | INTRAMUSCULAR | Status: DC | PRN

## 2015-06-27 MED ORDER — INSULIN GLARGINE 100 UNIT/ML ~~LOC~~ SOLN: 15.0000 [IU] | Freq: Every day | SUBCUTANEOUS | Status: DC

## 2015-06-27 MED ORDER — INSULIN GLARGINE 100 UNIT/ML ~~LOC~~ SOLN
15.0000 [IU] | Freq: Every day | SUBCUTANEOUS | Status: DC
  Administered 2015-06-27: 15 [IU] via SUBCUTANEOUS
  Filled 2015-06-27 (×2): qty 0.15

## 2015-06-27 MED ORDER — INSULIN GLARGINE 100 UNIT/ML ~~LOC~~ SOLN
5.0000 [IU] | Freq: Once | SUBCUTANEOUS | Status: AC
  Administered 2015-06-27: 5 [IU] via SUBCUTANEOUS
  Filled 2015-06-27: qty 0.05

## 2015-06-27 NOTE — Progress Notes (Signed)
Initial Nutrition Assessment  DOCUMENTATION CODES:   Not applicable  INTERVENTION:    Glucerna Shake PO BID, each supplement provides 220 kcal and 10 grams of protein  NUTRITION DIAGNOSIS:   Inadequate oral intake related to poor appetite, acute illness as evidenced by patient/family report.  GOAL:   Patient will meet greater than or equal to 90% of their needs  MONITOR:   PO intake, Supplement acceptance, Labs, Weight trends, I & O's  REASON FOR ASSESSMENT:   Malnutrition Screening Tool    ASSESSMENT:   55 y.o. female presenting with DKA. PMH is significant for HTN, NICM, Pulmonary HTN, DM, HF, Tobacco Abuse.  Spoke with patient and her daughter. They report that patient has been eating poorly at home since recent discharge from the hospital in [facility]. Daughter is very concerned that patient had lost down to 115 lbs at home; she was weighing her every morning. Suspect weight loss related to fluid status and dehydration, since weight is now up to 121 lbs. She usually weighs ~125 lbs. Patient says her appetite has improved and she likes to eat. Will add Glucerna Shake BID to maximize protein and calorie intake.   Diet Order:  Diet heart healthy/carb modified Room service appropriate?: Yes; Fluid consistency:: Thin  Skin:  Reviewed, no issues  Last BM:  2/16  Height:   Ht Readings from Last 1 Encounters:  06/27/15 5\' 4"  (1.626 m)    Weight:   Wt Readings from Last 1 Encounters:  06/27/15 121 lb 11.1 oz (55.2 kg)    Ideal Body Weight:  54.5 kg  BMI:  Body mass index is 20.88 kg/(m^2).  Estimated Nutritional Needs:   Kcal:  1500-1700  Protein:  70-85 gm  Fluid:  1.5-1.7 L  EDUCATION NEEDS:   Education needs addressed  Joaquin Courts, RD, LDN, CNSC Pager [phone] After Hours Pager [phone]

## 2015-06-27 NOTE — Care Management Note (Addendum)
Case Management Note  Patient Details  Name: Christine Benitez MRN: [id number] Date of Birth: 19-Oct-1960  Subjective/Objective:       Pt admitted with DKA             Action/Plan:  Pt is independent from home with adult daughter, plan is to discharge back home - no CM needs determined at this time.  CM will continue to monitor   Expected Discharge Date:                  Expected Discharge Plan:  Home/Self Care  In-House Referral:     Discharge planning Services  CM Consult  Post Acute Care Choice:    Choice offered to:     DME Arranged:    DME Agency:     HH Arranged:    HH Agency:     Status of Service:  In process, will continue to follow  Medicare Important Message Given:    Date Medicare IM Given:    Medicare IM give by:    Date Additional Medicare IM Given:    Additional Medicare Important Message give by:     If discussed at Long Length of Stay Meetings, dates discussed:    Additional Comments:  Cherylann Parr, RN 06/27/2015, 1:35 PM

## 2015-06-27 NOTE — Progress Notes (Signed)
Family Medicine Teaching Service Daily Progress Note Intern Pager: [phone]  Patient name: Christine Benitez Medical record number: [id number] Date of birth: 01-11-1961 Age: 55 y.o. Gender: female  Primary Care Provider: Ailene Ravel, MD Consultants: Cardiology  Code Status: DNR  Pt Overview and Major Events to Date:   Assessment and Plan:  Christine Benitez is a 55 y.o. female presenting with DKA. PMH is significant for HTN, NICM, Pulmonary HTN, DM, HF, Tobacco Abuse.  # DKA: States she has not been taking her insulin while at home.  CBGs [phone] in ED. AG 20. Sodium 127 (corrected to 132), potassium 4.9, CO2 18. Troponin negative. No signs of infection with normal WBC (8.8), normal urinalysis (note lack of ketones, glucose 500), and CXR without active cardiopulmonary disease. Two 250cc boluses of NS given in ED. - AG closed x 2 on BMET, give long acting insulin @ 9:15 and sensitive SSI , and stop insulin drip @ 11:15  - A1C pending  - NS@250  then transition to 1/2NS with D5 @250 /hr for 4 hours. Then transition to 150cc/hr. Fluids discussed with pharmacy given low EF. ---> Transitioned to D5 1/2 NS @ 150  Until 11:15  - K+ 4.3  - Start diet  - BMET @ 3 PM and then AM BMET  # Acute Kidney Injury: Most likely prerenal given dehydrated state of DKA. Creatinine 2.66>1.86. Baseline 0.9. - Continue to monitor on BMP - Fluids per above  # Anemia: Hemoglobin 17.3>16.2 - Will follow up CBC   # HFrEF: No crackles, no SOB, no pitting edema this AM. First diagnosed in 05/2014. Recent Echo at hospitalization in [place] showed EF 30-35%, moderate LVH, mild to moderate MR, moderate PH (PA 60-65%), mild to moderate pericardial effusion; catheterization also showed EF 30-35%. History of NICM also noted. Former Smoker. Home medications include Coreg, Enalapril, Lasix. - Restart meds once blood pressure improves  - Daily Weights  # HTN: Normotensive with BP 98/73 - Holding medication due to  hypotension. Restart when able.  FEN/GI:  D/C D5 1/2 NS @ 150 ml  @ 11:15, Carb/Diabetic Diet  Prophylaxis: Lovenox, Renally Adjusted  Disposition: Step-down--> Med-surg later in afternoon   Subjective:  Patient very sleep today, however no complaints. Has not been taking insulin at home. Ready to eat breakfast. Denies any chest pain, SOB, nausea/vomiting, dizziness, fever or chills.   Objective: Temp:  [97.6 F (36.4 C)-97.9 F (36.6 C)] 97.6 F (36.4 C) (02/17 0821) Pulse Rate:  [61-83] 61 (02/17 0900) Resp:  [13-24] 18 (02/17 0900) BP: (82-114)/(53-75) 98/73 mmHg (02/17 0900) SpO2:  [97 %-100 %] 100 % (02/17 0900) Weight:  [121 lb 11.1 oz (55.2 kg)] 121 lb 11.1 oz (55.2 kg) (02/17 0540) Physical Exam: General: NAD, lying in bed  Cardiovascular: RRR, no murmurs, rubs, or gallops  Respiratory: CTAB, no rhonchi, wheeze Abdomen: BS+, no ttp, no rebound  Extremities:No lower extremity edema.   Laboratory:  Recent Labs Lab 06/26/15 2002  WBC 8.8  HGB 17.3*  HCT 48.3*  PLT 268    Recent Labs Lab 06/26/15 2002 06/27/15 0102 06/27/15 0641  NA 127* 131* 133*  K 4.9 5.0 4.3  CL 89* 98* 99*  CO2 18* 20* 23  BUN 75* 67* 62*  CREATININE 2.66* 1.96* 1.86*  CALCIUM 10.1 8.9 9.2  PROT 7.6  --   --   BILITOT 1.2  --   --   ALKPHOS 153*  --   --   ALT 19  --   --  AST 26  --   --   GLUCOSE 404* 228* 176*    Imaging/Diagnostic Tests: Dg Chest 2 View  06/26/2015  CLINICAL DATA:  Chest pain. EXAM: CHEST  2 VIEW COMPARISON:  June 16, 2015. FINDINGS: The heart size and mediastinal contours are within normal limits. Both lungs are clear. No pneumothorax or pleural effusion is noted. The visualized skeletal structures are unremarkable. IMPRESSION: No active cardiopulmonary disease. Electronically Signed   By: Lupita Raider, M.D.   On: 06/26/2015 19:32    Keevin Panebianco Mayra Reel, MD 06/27/2015, 9:39 AM PGY-1, [facility] FPTS Intern pager: [phone], text  pages welcome

## 2015-06-27 NOTE — Progress Notes (Signed)
Spoke with nursing concerning BMP. States that last two attempted blood draws have been refused by lab due to it being clotted. Will continue to attempt to obtain lab to monitor electrolytes and anion gap.  Dr. Caroleen Hamman 06/27/15, 1:46 AM

## 2015-06-27 NOTE — Progress Notes (Signed)
Evaluated Mrs. Vrooman following IV fluids @250cc /hr x4hr. Patient sleeping comfortably. Denies shortness of breath. Satting 100% on room air. Lungs clear to auscultation without crackles. Will transition to D51/2NS at 150cc/hr. Re-evaluate in four hours for fluid status and consider discontinuing fluids at that time if appropriate.  Dr. Garry Heater 06/27/15, 4:13 AM

## 2015-06-27 NOTE — Progress Notes (Signed)
Inpatient Diabetes Program Recommendations  AACE/ADA: New Consensus Statement on Inpatient Glycemic Control (2015)  Target Ranges:  Prepandial:   less than 140 mg/dL      Peak postprandial:   less than 180 mg/dL (1-2 hours)      Critically ill patients:  140 - 180 mg/dL   Review of Glycemic Control  Results for Christine Benitez, Christine Benitez (MRN [id number]) as of 06/27/2015 09:59  Ref. Range 06/27/2015 03:03 06/27/2015 06:12 06/27/2015 07:17 06/27/2015 08:19 06/27/2015 09:25  Glucose-Capillary Latest Ref Range: 65-99 mg/dL 007 (H) 121 (H) 975 (H) 141 (H) 142 (H)    Diabetes history: Type 2 Outpatient Diabetes medications: Novolin 70/30 15 units qam, 10 units qpm (was on 35 units 70/30 in am and 15 units pre-supper before he went to [facility] hospital a week ago)  Current orders for Inpatient glycemic control: IV insulin DKA order set  Inpatient Diabetes Program Recommendations:  Lantus ordered by MD to be given now- please consider ordering Novolog correction insulin 0-9 units tid with meals and 0-5 units qhs.  Please give the first dose of Novolog when the insulin drip is discontinued.    MD note states the patient has not been taking her insulin recently- staff please review the importance of taking meds as ordered.   Susette Racer, RN, BA, MHA, CDE Diabetes Coordinator Inpatient Diabetes Program  [phone] (Team Pager) [phone] [facility] Office) 06/27/2015 10:07 AM

## 2015-06-27 NOTE — Progress Notes (Signed)
Utilization review completed. Argie Lober, RN, BSN. 

## 2015-06-28 LAB — GLUCOSE, CAPILLARY
GLUCOSE-CAPILLARY: 187 mg/dL — AB (ref 65–99)
GLUCOSE-CAPILLARY: 370 mg/dL — AB (ref 65–99)
GLUCOSE-CAPILLARY: 376 mg/dL — AB (ref 65–99)
GLUCOSE-CAPILLARY: 75 mg/dL (ref 65–99)
GLUCOSE-CAPILLARY: 111 mg/dL — AB (ref 65–99)
Glucose-Capillary: 162 mg/dL — ABNORMAL HIGH (ref 65–99)
Glucose-Capillary: 364 mg/dL — ABNORMAL HIGH (ref 65–99)
Glucose-Capillary: 164 mg/dL — ABNORMAL HIGH (ref 65–99)
Glucose-Capillary: 311 mg/dL — ABNORMAL HIGH (ref 65–99)

## 2015-06-28 LAB — BASIC METABOLIC PANEL
ANION GAP: 10 (ref 5–15)
BUN: 46 mg/dL — ABNORMAL HIGH (ref 6–20)
CALCIUM: 9.1 mg/dL (ref 8.9–10.3)
CO2: 22 mmol/L (ref 22–32)
Chloride: 102 mmol/L (ref 101–111)
Creatinine, Ser: 1.35 mg/dL — ABNORMAL HIGH (ref 0.44–1.00)
GFR calc Af Amer: 51 mL/min — ABNORMAL LOW (ref >60)
GFR, EST NON AFRICAN AMERICAN: 44 mL/min — AB (ref >60)
GLUCOSE: 135 mg/dL — AB (ref 65–99)
Potassium: 4.1 mmol/L (ref 3.5–5.1)
SODIUM: 134 mmol/L — AB (ref 135–145)

## 2015-06-28 LAB — CBC
HEMATOCRIT: 47.1 % — AB (ref 36.0–46.0)
Hemoglobin: 15.7 g/dL — ABNORMAL HIGH (ref 12.0–15.0)
MCH: 31.2 pg (ref 26.0–34.0)
MCHC: 33.3 g/dL (ref 30.0–36.0)
MCV: 93.6 fL (ref 78.0–100.0)
PLATELETS: 270 10*3/uL (ref 150–400)
RBC: 5.03 MIL/uL (ref 3.87–5.11)
RDW: 13.8 % (ref 11.5–15.5)
WBC: 6.7 10*3/uL (ref 4.0–10.5)

## 2015-06-28 LAB — HEMOGLOBIN A1C
Hgb A1c MFr Bld: 10.5 % — ABNORMAL HIGH (ref 4.8–5.6)
Mean Plasma Glucose: 255 mg/dL

## 2015-06-28 MED ORDER — ENOXAPARIN SODIUM 40 MG/0.4ML ~~LOC~~ SOLN
40.0000 mg | Freq: Every day | SUBCUTANEOUS | Status: DC
  Administered 2015-06-29: 40 mg via SUBCUTANEOUS
  Filled 2015-06-28: qty 0.4

## 2015-06-28 MED ORDER — CARVEDILOL 3.125 MG PO TABS
3.1250 mg | ORAL_TABLET | Freq: Every day | ORAL | Status: DC
  Administered 2015-06-28 – 2015-06-29 (×2): 3.125 mg via ORAL
  Filled 2015-06-28 (×2): qty 1

## 2015-06-28 MED ORDER — INSULIN ASPART PROT & ASPART (70-30 MIX) 100 UNIT/ML ~~LOC~~ SUSP
15.0000 [IU] | Freq: Every day | SUBCUTANEOUS | Status: DC
  Administered 2015-06-28: 15 [IU] via SUBCUTANEOUS
  Filled 2015-06-28: qty 10

## 2015-06-28 MED ORDER — INSULIN ASPART PROT & ASPART (70-30 MIX) 100 UNIT/ML ~~LOC~~ SUSP
15.0000 [IU] | Freq: Two times a day (BID) | SUBCUTANEOUS | Status: DC
  Filled 2015-06-28: qty 10

## 2015-06-28 MED ORDER — CARVEDILOL 3.125 MG PO TABS
3.1250 mg | ORAL_TABLET | Freq: Two times a day (BID) | ORAL | Status: DC
  Filled 2015-06-28 (×2): qty 1

## 2015-06-28 MED ORDER — INSULIN ASPART PROT & ASPART (70-30 MIX) 100 UNIT/ML ~~LOC~~ SUSP
25.0000 [IU] | Freq: Every day | SUBCUTANEOUS | Status: DC
  Administered 2015-06-28 – 2015-06-29 (×2): 25 [IU] via SUBCUTANEOUS
  Filled 2015-06-28: qty 10

## 2015-06-28 NOTE — Progress Notes (Signed)
Notified Dr. Cathlean Cower regarding pts repeat CBG being 376. Instructed to give the pt 10 units Novolog.

## 2015-06-28 NOTE — Progress Notes (Signed)
Spoke with Dr. Cathlean Cower about pts CBG being 370. Was instructed to give 25 units Novolog Mix 70/30 and hold sliding scale insulin. Will recheck CBG in 2 hours.

## 2015-06-28 NOTE — Progress Notes (Signed)
Family Medicine Teaching Service Daily Progress Note Intern Pager: [phone]  Patient name: Christine Benitez Medical record number: [id number] Date of birth: 06/10/1960 Age: 55 y.o. Gender: female  Primary Care Provider: Ailene Ravel, MD Consultants: Cardiology  Code Status: DNR  Pt Overview and Major Events to Date:   Assessment and Plan:  Christine Benitez is a 55 y.o. female presenting with DKA. PMH is significant for HTN, NICM, Pulmonary HTN, DM, HF, Tobacco Abuse.  # Resolved DKA: States she has not been taking her insulin while at home.  CBGs [phone] in ED. AG 20>>>10. Sodium 127 (corrected to 132), potassium 4.9, CO2 18. Troponin negative. No signs of infection with normal WBC (8.8), normal urinalysis (note lack of ketones, glucose 500), and CXR without active cardiopulmonary disease. Hemoglobin 17.3>16.2, hemoconcentration due to patient's dehydration.  Received a 250cc boluses of NS given in ED. - Received 20 units of Lantus and 24 units of correctional over the past 24 hours;  transitioned to 70:30 25 units with breakfast and 15 units with dinner - A1C 10.5  - Taking PO fluids - Diabetic Education; patient stopped insulin when was not feeling well.   # Acute Kidney Injury: Most likely prerenal given dehydrated state of DKA. Creatinine 2.66>1.86> 1.35  Baseline 0.9. - Continue to monitor on BMP - PO Fluids   # HFrEF: No crackles, no SOB, no pitting edema this AM. First diagnosed in 05/2014. Recent Echo at hospitalization in [place] showed EF 30-35%, moderate LVH, mild to moderate MR, moderate PH (PA 60-65%), mild to moderate pericardial effusion; catheterization also showed EF 30-35%. History of NICM also noted. Former Smoker. Home medications include Coreg, Enalapril, Lasix. - Consider starting coreg at 3.125 mg daily  - continue holding enalapril and lasix   - Daily Weight 121 lbs>   # HTN: Normotensive with BP 103/72  - Holding medication due to hypotension. Restart when  able.  FEN/GI:  Carb/Diabetic Diet  Prophylaxis: Lovenox  Disposition: Med surg  Subjective:  Patient doing well much improved this morning. Patient;s DKA likely due to a decreased insulin regiment and patient not taking her insulin when she was not feeling well which likely exacerbated patient's condition. Denies chest pain, dizziness, nausea, vomiting, fever or chills.   Objective: Temp:  [97.6 F (36.4 C)-98.6 F (37 C)] 98.1 F (36.7 C) (02/18 0800) Pulse Rate:  [61-83] 83 (02/18 0000) Resp:  [15-30] 20 (02/18 0000) BP: (91-112)/(61-97) 103/72 mmHg (02/18 0000) SpO2:  [98 %-100 %] 100 % (02/18 0000) Physical Exam: General: NAD, lying in bed  Cardiovascular: RRR, no murmurs, rubs, or gallops  Respiratory: CTAB, no rhonchi, wheeze Abdomen: BS+, no ttp, no rebound  Extremities:No lower extremity edema.   Laboratory:  Recent Labs Lab 06/26/15 2002 06/27/15 1109  WBC 8.8 7.0  HGB 17.3* 16.2*  HCT 48.3* 45.9  PLT 268 268    Recent Labs Lab 06/26/15 2002  06/27/15 1450 06/27/15 2317 06/28/15 0241  NA 127*  < > 131* 131* 134*  K 4.9  < > 4.4 4.6 4.1  CL 89*  < > 96* 97* 102  CO2 18*  < > 20* 23 22  BUN 75*  < > 54* 49* 46*  CREATININE 2.66*  < > 1.55* 1.52* 1.35*  CALCIUM 10.1  < > 8.9 8.9 9.1  PROT 7.6  --   --   --   --   BILITOT 1.2  --   --   --   --   [name]  153*  --   --   --   --   ALT 19  --   --   --   --   AST 26  --   --   --   --   GLUCOSE 404*  < > 354* 386* 135*  < > = values in this interval not displayed.  Imaging/Diagnostic Tests: Dg Chest 2 View  06/26/2015  CLINICAL DATA:  Chest pain. EXAM: CHEST  2 VIEW COMPARISON:  June 16, 2015. FINDINGS: The heart size and mediastinal contours are within normal limits. Both lungs are clear. No pneumothorax or pleural effusion is noted. The visualized skeletal structures are unremarkable. IMPRESSION: No active cardiopulmonary disease. Electronically Signed   By: Lupita Raider, M.D.   On: 06/26/2015  19:32    Malahki Gasaway Mayra Reel, MD 06/28/2015, 8:11 AM PGY-1, [facility] FPTS Intern pager: [phone], text pages welcome

## 2015-06-29 DIAGNOSIS — I5022 Chronic systolic (congestive) heart failure: Secondary | ICD-10-CM | POA: Insufficient documentation

## 2015-06-29 LAB — GLUCOSE, CAPILLARY
GLUCOSE-CAPILLARY: 134 mg/dL — AB (ref 65–99)
GLUCOSE-CAPILLARY: 344 mg/dL — AB (ref 65–99)
GLUCOSE-CAPILLARY: 238 mg/dL — AB (ref 65–99)
Glucose-Capillary: 112 mg/dL — ABNORMAL HIGH (ref 65–99)
Glucose-Capillary: 179 mg/dL — ABNORMAL HIGH (ref 65–99)
Glucose-Capillary: 240 mg/dL — ABNORMAL HIGH (ref 65–99)

## 2015-06-29 LAB — BASIC METABOLIC PANEL
Anion gap: 9 (ref 5–15)
BUN: 28 mg/dL — ABNORMAL HIGH (ref 6–20)
CALCIUM: 9.2 mg/dL (ref 8.9–10.3)
CO2: 22 mmol/L (ref 22–32)
CREATININE: 1.12 mg/dL — AB (ref 0.44–1.00)
Chloride: 102 mmol/L (ref 101–111)
GFR calc Af Amer: >60 mL/min (ref >60)
GFR calc non Af Amer: 55 mL/min — ABNORMAL LOW (ref >60)
GLUCOSE: 211 mg/dL — AB (ref 65–99)
Potassium: 4.9 mmol/L (ref 3.5–5.1)
Sodium: 133 mmol/L — ABNORMAL LOW (ref 135–145)

## 2015-06-29 MED ORDER — ONDANSETRON HCL 4 MG PO TABS
4.0000 mg | ORAL_TABLET | Freq: Once | ORAL | Status: AC
  Administered 2015-06-29: 4 mg via ORAL
  Filled 2015-06-29: qty 1

## 2015-06-29 MED ORDER — CARVEDILOL 3.125 MG PO TABS: 3.1250 mg | ORAL_TABLET | Freq: Two times a day (BID) | ORAL | Status: DC

## 2015-06-29 MED ORDER — INSULIN NPH ISOPHANE & REGULAR (70-30) 100 UNIT/ML ~~LOC~~ SUSP: SUBCUTANEOUS | Status: DC

## 2015-06-29 NOTE — Progress Notes (Signed)
Spoke with Dr. Cathlean Cower, she is requesting that we continue Q 2 hour blood sugars.  The CBG at 2045 is 75.  MD instructed to give patient small snack.  Orders carried out.  Will continue to monitor the patient.  [employer] RN-BC, WTA.

## 2015-06-29 NOTE — Progress Notes (Signed)
Family Medicine Teaching Service Daily Progress Note Intern Pager: [phone]  Patient name: Christine Benitez Medical record number: [id number] Date of birth: Sep 28, 1960 Age: 55 y.o. Gender: female  Primary Care Provider: Ailene Ravel, MD Consultants: Cardiology  Code Status: DNR  Pt Overview and Major Events to Date:   Assessment and Plan: Christine Benitez is a 55 y.o. female presenting with DKA. PMH is significant for HTN, NICM, Pulmonary HTN, DM, HF, Tobacco Abuse.  # Resolved DKA: States she has not been taking her insulin while at home.  CBGs [phone] in ED. AG 20>>>10. Sodium 127 (corrected to 132), potassium 4.9, CO2 18. Troponin negative. No signs of infection with normal WBC (8.8), normal urinalysis (note lack of ketones, glucose 500), and CXR without active cardiopulmonary disease. -  transitioned to 70:30 25 units with breakfast and 15 units with dinner - increase to 25 and 20 - also received 24 units of Novolog yesterday (morning 70/30 was given late) - A1C 10.5  - Taking PO fluids - Diabetic Education; patient stopped insulin when was not feeling well.   # Acute Kidney Injury: Improving. Most likely prerenal given dehydrated state of DKA. Creatinine 2.66>1.86> 1.35 >1.12  Baseline 0.9. - Continue to monitor  # HFrEF: No crackles, no SOB, no pitting edema this AM. First diagnosed in 05/2014. Recent Echo at hospitalization in [place] showed EF 30-35%, moderate LVH, mild to moderate MR, moderate PH (PA 60-65%), mild to moderate pericardial effusion; catheterization also showed EF 30-35%. History of NICM also noted. Former Smoker. Home medications include Coreg, Enalapril, Lasix. - Restart home meds - Daily Weight 121 lbs> 117  # HTN: Normotensive with BP 111/78 - Holding medication due to hypotension - restart home meds  FEN/GI:  Carb/Diabetic Diet  Prophylaxis: Lovenox  Disposition: Med surg, can likely d/c today as DKA resolved and AKI resolving.  Subjective:   Patient doing well, much improved this morning.  Denies chest pain, dizziness, nausea, vomiting, fever or chills. Wonders if she can go home today  Objective: Temp:  [97.4 F (36.3 C)-99 F (37.2 C)] 98.5 F (36.9 C) (02/19 0405) Pulse Rate:  [76-93] 93 (02/19 0405) Resp:  [18-20] 20 (02/19 0405) BP: (104-121)/(64-83) 111/78 mmHg (02/19 0405) SpO2:  [99 %-100 %] 99 % (02/19 0405) Weight:  [117 lb 15.1 oz (53.5 kg)-118 lb 9.7 oz (53.8 kg)] 117 lb 15.1 oz (53.5 kg) (02/19 0458) Physical Exam: General: NAD, sitting on side of bed eating breakfast Cardiovascular: RRR, no murmurs, rubs, or gallops  Respiratory: CTAB, no rhonchi, wheeze Abdomen: BS+, no ttp, no rebound  Extremities:No lower extremity edema.   Laboratory:  Recent Labs Lab 06/26/15 2002 06/27/15 1109 06/28/15 1032  WBC 8.8 7.0 6.7  HGB 17.3* 16.2* 15.7*  HCT 48.3* 45.9 47.1*  PLT 268 268 270    Recent Labs Lab 06/26/15 2002  06/27/15 2317 06/28/15 0241 06/29/15 0520  NA 127*  < > 131* 134* 133*  K 4.9  < > 4.6 4.1 4.9  CL 89*  < > 97* 102 102  CO2 18*  < > 23 22 22   BUN 75*  < > 49* 46* 28*  CREATININE 2.66*  < > 1.52* 1.35* 1.12*  CALCIUM 10.1  < > 8.9 9.1 9.2  PROT 7.6  --   --   --   --   BILITOT 1.2  --   --   --   --   ALKPHOS 153*  --   --   --   --  ALT 19  --   --   --   --   AST 26  --   --   --   --   GLUCOSE 404*  < > 386* 135* 211*  < > = values in this interval not displayed.  Imaging/Diagnostic Tests: No results found.  Erasmo Downer, MD 06/29/2015, 8:16 AM PGY-2, [facility] FPTS Intern pager: [phone], text pages welcome

## 2015-06-29 NOTE — Discharge Instructions (Signed)
You were admitted for poorly controlled diabetes (see below).  You should take 70/30 insulin: 25 units daily with breakfast and 20 units with dinner daily at home.  Do not take any other insulin.  You should take your other medications as prescribed.  Make follow-up appointment with primary care doctor.   Diabetic Ketoacidosis Diabetic ketoacidosis is a life-threatening complication of diabetes. If it is not treated, it can cause severe dehydration and organ damage and can lead to a coma or death. CAUSES This condition develops when there is not enough of the hormone insulin in the body. Insulin helps the body to break down sugar for energy. Without insulin, the body cannot break down sugar, so it breaks down fats instead. This leads to the production of acids that are called ketones. Ketones are poisonous at high levels. This condition can be triggered by:  Stress on the body that is brought on by an illness.  Medicines that raise blood glucose levels.  Not taking diabetes medicine. SYMPTOMS Symptoms of this condition include:  Fatigue.  Weight loss.  Excessive thirst.  Light-headedness.  Fruity or sweet-smelling breath.  Excessive urination.  Vision changes.  Confusion or irritability.  Nausea.  Vomiting.  Rapid breathing.  Abdominal pain.  Feeling flushed. DIAGNOSIS This condition is diagnosed based on a medical history, a physical exam, and blood tests. You may also have a urine test that checks for ketones. TREATMENT This condition may be treated with:  Fluid replacement. This may be done to correct dehydration.  Insulin injections. These may be given through the skin or through an IV tube.  Electrolyte replacement. Electrolytes, such as potassium and sodium, may be given in pill form or through an IV tube.  Antibiotic medicines. These may be prescribed if your condition was caused by an infection. HOME CARE INSTRUCTIONS Eating and Drinking  Drink enough  fluids to keep your urine clear or pale yellow.  If you cannot eat, alternate between drinking fluids with sugar (such as juice) and salty fluids (such as broth or bouillon).  If you can eat, follow your usual diet and drink sugar-free liquids, such as water. Other Instructions  Take insulin as directed by your health care provider. Do not skip insulin injections. Do not use expired insulin.  If your blood sugar is over 240 mg/dL, monitor your urine ketones every 4-6 hours.  If you were prescribed an antibiotic medicine, finish all of it even if you start to feel better.  Rest and exercise only as directed by your health care provider.  If you get sick, call your health care provider and begin treatment quickly. Your body often needs extra insulin to fight an illness.  Check your blood glucose levels regularly. If your blood glucose is high, drink plenty of fluids. This helps to flush out ketones. SEEK MEDICAL CARE IF:  Your blood glucose level is too high or too low.  You have ketones in your urine.  You have a fever.  You cannot eat.  You cannot tolerate fluids.  You have been vomiting for more than 2 hours.  You continue to have symptoms of this condition.  You develop new symptoms. SEEK IMMEDIATE MEDICAL CARE IF:  Your blood glucose levels continue to be high (elevated).  Your monitor reads "high" even when you are taking insulin.  You faint.  You have chest pain.  You have trouble breathing.  You have a sudden, severe headache.  You have sudden weakness in one arm or one leg.  You have sudden trouble speaking or swallowing.  You have vomiting or diarrhea that gets worse after 3 hours.  You feel severely fatigued.  You have trouble thinking.  You have abdominal pain.  You are severely dehydrated. Symptoms of severe dehydration include:  Extreme thirst.  Dry mouth.  Blue lips.  Cold hands and feet.  Rapid breathing.   This information is not  intended to replace advice given to you by your health care provider. Make sure you discuss any questions you have with your health care provider.   Document Released: 04/23/2000 Document Revised: 09/10/2014 Document Reviewed: 04/03/2014 Elsevier Interactive Patient Education 2016 [employer]  Blood Glucose Monitoring, Adult Monitoring your blood glucose (also know as blood sugar) helps you to manage your diabetes. It also helps you and your health care provider monitor your diabetes and determine how well your treatment plan is working. WHY SHOULD YOU MONITOR YOUR BLOOD GLUCOSE?  It can help you understand how food, exercise, and medicine affect your blood glucose.  It allows you to know what your blood glucose is at any given moment. You can quickly tell if you are having low blood glucose (hypoglycemia) or high blood glucose (hyperglycemia).  It can help you and your health care provider know how to adjust your medicines.  It can help you understand how to manage an illness or adjust medicine for exercise. WHEN SHOULD YOU TEST? Your health care provider will help you decide how often you should check your blood glucose. This may depend on the type of diabetes you have, your diabetes control, or the types of medicines you are taking. Be sure to write down all of your blood glucose readings so that this information can be reviewed with your health care provider. See below for examples of testing times that your health care provider may suggest. Type 1 Diabetes  Test at least 2 times per day if your diabetes is well controlled, if you are using an insulin pump, or if you perform multiple daily injections.  If your diabetes is not well controlled or if you are sick, you may need to test more often.  It is a good idea to also test:  Before every insulin injection.  Before and after exercise.  Between meals and 2 hours after a meal.  Occasionally between 2:00 a.m. and 3:00 a.m. Type  2 Diabetes  If you are taking insulin, test at least 2 times per day. However, it is best to test before every insulin injection.  If you take medicines by mouth (orally), test 2 times a day.  If you are on a controlled diet, test once a day.  If your diabetes is not well controlled or if you are sick, you may need to monitor more often. HOW TO MONITOR YOUR BLOOD GLUCOSE Supplies Needed  Blood glucose meter.  Test strips for your meter. Each meter has its own strips. You must use the strips that go with your own meter.  A pricking needle (lancet).  A device that holds the lancet (lancing device).  A journal or log book to write down your results. Procedure  Wash your hands with soap and water. Alcohol is not preferred.  Prick the side of your finger (not the tip) with the lancet.  Gently milk the finger until a small drop of blood appears.  Follow the instructions that come with your meter for inserting the test strip, applying blood to the strip, and using your blood glucose meter. Other Areas  to Get Blood for Testing Some meters allow you to use other areas of your body (other than your finger) to test your blood. These areas are called alternative sites. The most common alternative sites are:  The forearm.  The thigh.  The back area of the lower leg.  The palm of the hand. The blood flow in these areas is slower. Therefore, the blood glucose values you get may be delayed, and the numbers are different from what you would get from your fingers. Do not use alternative sites if you think you are having hypoglycemia. Your reading will not be accurate. Always use a finger if you are having hypoglycemia. Also, if you cannot feel your lows (hypoglycemia unawareness), always use your fingers for your blood glucose checks. ADDITIONAL TIPS FOR GLUCOSE MONITORING  Do not reuse lancets.  Always carry your supplies with you.  All blood glucose meters have a 24-hour "hotline"  number to call if you have questions or need help.  Adjust (calibrate) your blood glucose meter with a control solution after finishing a few boxes of strips. BLOOD GLUCOSE RECORD KEEPING It is a good idea to keep a daily record or log of your blood glucose readings. Most glucose meters, if not all, keep your glucose records stored in the meter. Some meters come with the ability to download your records to your home computer. Keeping a record of your blood glucose readings is especially helpful if you are wanting to look for patterns. Make notes to go along with the blood glucose readings because you might forget what happened at that exact time. Keeping good records helps you and your health care provider to work together to achieve good diabetes management.    This information is not intended to replace advice given to you by your health care provider. Make sure you discuss any questions you have with your health care provider.   Document Released: 04/29/2003 Document Revised: 05/17/2014 Document Reviewed: 09/18/2012 Elsevier Interactive Patient Education 2016 [employer].  Complementary and Alternative Medical Therapies for Diabetes Complementary and alternative medicines are health care practices or products that are not always accepted as part of routine medicine. Complementary medicine is used along with routine medicine (medical therapy). Alternative medicine can sometimes be used instead of routine medicine. Some people use these methods to treat diabetes. While some of these therapies may be effective, others may not be. Some may even be harmful. Patients using these methods need to tell their caregiver. It is important to let your caregivers know what you are doing. Some of these therapies are discussed below. For more information, talk with your caregiver. THERAPIES Acupuncture Acupuncture is done by a professional who inserts needles into certain points on the skin. Some scientists  believe that this triggers the release of the body's natural painkillers. It has been shown to relieve long-term (chronic) pain. This may help patients with painful nerve damage caused by diabetes. Biofeedback Biofeedback helps a person become more aware of the body's response to pain. It also helps you learn to deal with the pain. This alternative therapy focuses on relaxation and stress-reduction techniques. Thinking of peaceful mental images (guided imagery) is one technique. Some people believe these images can ease their condition. MEDICATIONS Chromium Several studies report that chromium supplements may improve diabetes control. Chromium helps insulin improve its action. Research is not yet certain. Supplements have not been recommended or approved. Caution is needed if you have kidney (renal) problems. Ginseng There are several types of ginseng  plants. American ginseng is used for diabetes studies. Those studies have shown some glucose-lowering effects. Those effects have been seen with fasting and after-meal blood glucose levels. They have also been seen in A1c levels (average blood glucose levels over a 19-month period). More long-term studies are needed before recommendations for use of ginseng can be made. Magnesium Experts have studied the relationship between magnesium and diabetes for many years. But it is not yet fully understood. Studies suggest that a low amount of magnesium may make blood glucose control worse in type 2 diabetes. Research also shows that a low amount may contribute to certain diabetes complications. One study showed that people who consume more magnesium had less risk of type 2 diabetes. Eating whole grains, nuts, and green leafy vegetables raises the magnesium level. Vanadium Vanadium is a compound found in tiny amounts in plants and animals. Early studies showed that vanadium improved blood glucose levels in animals with type 1 and type 2 diabetes. One study found that  when given vanadium, those with diabetes were able to decrease their insulin dosage. Researchers still need to learn how it works in the body to discover any side effects, and to find safe dosages. Cinnamon There have been a couple of studies that seem to indicate cinnamon decreases insulin resistance and increases insulin production. By doing so, it may lower blood glucose. Exact doses are unknown, but it may work best when used in combination with other diabetes medicines.   This information is not intended to replace advice given to you by your health care provider. Make sure you discuss any questions you have with your health care provider.   Document Released: 02/21/2007 Document Revised: 07/19/2011 Document Reviewed: 03/06/2009 Elsevier Interactive Patient Education 2016 [employer].  Your health care provider has decided you need to take insulin regularly. You have been given a correction scale (also called a sliding scale) in case you need extra insulin when your blood sugar is too high (hyperglycemia). The following instructions will assist you in how to use that correction scale.  WHAT IS A CORRECTION SCALE?  When you check your blood sugar, sometimes it will be higher than your health care provider has told you it should be. You may need an extra dose of insulin to bring your blood sugar to the recommended level (also known as your goal, target, or normal level). The correction scale is prescribed by your health care provider based on your specific needs.  Your correction scale has two parts:   The first shows you a blood sugar range.   The second part tells you how much extra insulin to give yourself if your blood sugar falls within this range. You will not need an extra dose of insulin if your blood glucose is in the desired range. You should simply give yourself the normal amount of insulin that your health care provider has ordered for you.  WHY IS IT IMPORTANT TO KEEP YOUR  BLOOD SUGAR LEVELS AT YOUR DESIRED LEVEL?  Keeping your blood sugar at the desired level helps to prevent long-term complications of diabetes, such as eye disease, kidney failure, nerve damage, and other serious complications. WHAT TYPE OF INSULIN WILL YOU USE?  To help bring down blood sugar levels that are too high, your health care provider will prescribe a short-acting or a rapid-acting insulin. An example of a short-acting insulin would be regular insulin. Remember, you may also have a longer-acting insulin prescribed for you.  WHAT DO YOU NEED TO  DO?   Check your blood sugar with your home blood glucose meter as recommended by your health care provider.   Using your correction scale, find the range that your blood sugar lies in.   Look for the units of insulin that match that blood sugar range. Give yourself the dose of correction insulin your health care provider has prescribed. Always make sure you are using the right type of insulin.   Prior to the injection, make sure you have food available that you can eat in the next 15-30 minutes.   If your correction insulin is rapid acting, start eating your meal within 15 minutes after you have given yourself the insulin injection. If you wait longer than 15 minutes to eat, your blood sugar might get too low.   If your correction insulin is short acting(regular), start eating your meal within 30 minutes after you have given yourself the insulin injection. If you wait longer than 30 minutes to eat, your blood sugar might get too low. Symptoms of low blood sugar (hypoglycemia) may include feeling shaky or weak, sweating, feeling confused, difficulty seeing, agitation, crankiness, or numbness of the lips or tongue. Check your blood sugar immediately and treat your results as directed by your health care provider.   Keep a log of your blood sugar results with the time you took the test and the amount of insulin that you injected. This  information will help your health care provider manage your medicines.   Note on your log anything that may affect your blood sugar level, such as:   Changes in normal exercise or activity.   Changes in your normal schedule, such as staying up late, going on vacation, changing your diet, or holidays.   New medicines. This includes prescription and over-the-counter medicines. Some medicines may cause high blood sugar.   Sickness, stress, or anxiety.   Changes in the time you took your medicine.   Changes in your meals, such as skipping a meal, having a late meal, or dining out.   Eating things that may affect blood glucose, such as snacks, meal portions that are larger than normal, drinks with sugar, or eating less than usual.   Ask your health care provider any questions you have.  Be aware of "stacking" your insulin doses. This happens when you correct a high blood sugar level by giving yourself extra insulin too soon after a previous correction dose or mealtime dose. You may then have too much insulin still active in your body and may be at risk for hypoglycemia. WHY DO YOU NEED A CORRECTION SCALE IF YOU HAVE NEVER BEEN DIAGNOSED WITH DIABETES?   Keeping your blood glucose in the target range is important for your overall health.   You may have been prescribed medicines that cause your blood glucose to be higher than normal. WHEN SHOULD YOU SEEK MEDICAL CARE? Contact your health care provider if:   You have experienced hypoglycemia that you are unable to treat with your usual routine.   You have a high blood sugar level that is not coming down with the correction dose.  Your blood sugar is often too low or does not come up even if you eat a fast-acting carbohydrate. Someone who lives with you should seek immediate medical care if you become unresponsive.   This information is not intended to replace advice given to you by your health care provider. Make sure you  discuss any questions you have with your health care provider.  Document Released: 09/17/2010 Document Revised: 12/27/2012 Document Reviewed: 10/06/2012 Elsevier Interactive Patient Education 2016 Else[employer] Inc.  Complementary and Alternative Medical Therapies for Diabetes Complementary and alternative medicines are health care practices or products that are not always accepted as part of routine medicine. Complementary medicine is used along with routine medicine (medical therapy). Alternative medicine can sometimes be used instead of routine medicine. Some people use these methods to treat diabetes. While some of these therapies may be effective, others may not be. Some may even be harmful. Patients using these methods need to tell their caregiver. It is important to let your caregivers know what you are doing. Some of these therapies are discussed below. For more information, talk with your caregiver. THERAPIES Acupuncture Acupuncture is done by a professional who inserts needles into certain points on the skin. Some scientists believe that this triggers the release of the body's natural painkillers. It has been shown to relieve long-term (chronic) pain. This may help patients with painful nerve damage caused by diabetes. Biofeedback Biofeedback helps a person become more aware of the body's response to pain. It also helps you learn to deal with the pain. This alternative therapy focuses on relaxation and stress-reduction techniques. Thinking of peaceful mental images (guided imagery) is one technique. Some people believe these images can ease their condition. MEDICATIONS Chromium Several studies report that chromium supplements may improve diabetes control. Chromium helps insulin improve its action. Research is not yet certain. Supplements have not been recommended or approved. Caution is needed if you have kidney (renal) problems. Ginseng There are several types of ginseng plants. American  ginseng is used for diabetes studies. Those studies have shown some glucose-lowering effects. Those effects have been seen with fasting and after-meal blood glucose levels. They have also been seen in A1c levels (average blood glucose levels over a 20-month period). More long-term studies are needed before recommendations for use of ginseng can be made. Magnesium Experts have studied the relationship between magnesium and diabetes for many years. But it is not yet fully understood. Studies suggest that a low amount of magnesium may make blood glucose control worse in type 2 diabetes. Research also shows that a low amount may contribute to certain diabetes complications. One study showed that people who consume more magnesium had less risk of type 2 diabetes. Eating whole grains, nuts, and green leafy vegetables raises the magnesium level. Vanadium Vanadium is a compound found in tiny amounts in plants and animals. Early studies showed that vanadium improved blood glucose levels in animals with type 1 and type 2 diabetes. One study found that when given vanadium, those with diabetes were able to decrease their insulin dosage. Researchers still need to learn how it works in the body to discover any side effects, and to find safe dosages. Cinnamon There have been a couple of studies that seem to indicate cinnamon decreases insulin resistance and increases insulin production. By doing so, it may lower blood glucose. Exact doses are unknown, but it may work best when used in combination with other diabetes medicines.   This information is not intended to replace advice given to you by your health care provider. Make sure you discuss any questions you have with your health care provider.   Document Released: 02/21/2007 Document Revised: 07/19/2011 Document Reviewed: 03/06/2009 Elsevier Interactive Patient Education 2016 [employer] Inc.  Blood Glucose Monitoring, Adult Monitoring your blood glucose (also know  as blood sugar) helps you to manage your diabetes. It also helps you and your health  care provider monitor your diabetes and determine how well your treatment plan is working. WHY SHOULD YOU MONITOR YOUR BLOOD GLUCOSE?  It can help you understand how food, exercise, and medicine affect your blood glucose.  It allows you to know what your blood glucose is at any given moment. You can quickly tell if you are having low blood glucose (hypoglycemia) or high blood glucose (hyperglycemia).  It can help you and your health care provider know how to adjust your medicines.  It can help you understand how to manage an illness or adjust medicine for exercise. WHEN SHOULD YOU TEST? Your health care provider will help you decide how often you should check your blood glucose. This may depend on the type of diabetes you have, your diabetes control, or the types of medicines you are taking. Be sure to write down all of your blood glucose readings so that this information can be reviewed with your health care provider. See below for examples of testing times that your health care provider may suggest. Type 1 Diabetes  Test at least 2 times per day if your diabetes is well controlled, if you are using an insulin pump, or if you perform multiple daily injections.  If your diabetes is not well controlled or if you are sick, you may need to test more often.  It is a good idea to also test:  Before every insulin injection.  Before and after exercise.  Between meals and 2 hours after a meal.  Occasionally between 2:00 a.m. and 3:00 a.m. Type 2 Diabetes  If you are taking insulin, test at least 2 times per day. However, it is best to test before every insulin injection.  If you take medicines by mouth (orally), test 2 times a day.  If you are on a controlled diet, test once a day.  If your diabetes is not well controlled or if you are sick, you may need to monitor more often. HOW TO MONITOR YOUR BLOOD  GLUCOSE Supplies Needed  Blood glucose meter.  Test strips for your meter. Each meter has its own strips. You must use the strips that go with your own meter.  A pricking needle (lancet).  A device that holds the lancet (lancing device).  A journal or log book to write down your results. Procedure  Wash your hands with soap and water. Alcohol is not preferred.  Prick the side of your finger (not the tip) with the lancet.  Gently milk the finger until a small drop of blood appears.  Follow the instructions that come with your meter for inserting the test strip, applying blood to the strip, and using your blood glucose meter. Other Areas to Get Blood for Testing Some meters allow you to use other areas of your body (other than your finger) to test your blood. These areas are called alternative sites. The most common alternative sites are:  The forearm.  The thigh.  The back area of the lower leg.  The palm of the hand. The blood flow in these areas is slower. Therefore, the blood glucose values you get may be delayed, and the numbers are different from what you would get from your fingers. Do not use alternative sites if you think you are having hypoglycemia. Your reading will not be accurate. Always use a finger if you are having hypoglycemia. Also, if you cannot feel your lows (hypoglycemia unawareness), always use your fingers for your blood glucose checks. ADDITIONAL TIPS FOR GLUCOSE MONITORING  Do not reuse lancets.  Always carry your supplies with you.  All blood glucose meters have a 24-hour "hotline" number to call if you have questions or need help.  Adjust (calibrate) your blood glucose meter with a control solution after finishing a few boxes of strips. BLOOD GLUCOSE RECORD KEEPING It is a good idea to keep a daily record or log of your blood glucose readings. Most glucose meters, if not all, keep your glucose records stored in the meter. Some meters come with the  ability to download your records to your home computer. Keeping a record of your blood glucose readings is especially helpful if you are wanting to look for patterns. Make notes to go along with the blood glucose readings because you might forget what happened at that exact time. Keeping good records helps you and your health care provider to work together to achieve good diabetes management.    This information is not intended to replace advice given to you by your health care provider. Make sure you discuss any questions you have with your health care provider.   Document Released: 04/29/2003 Document Revised: 05/17/2014 Document Reviewed: 09/18/2012 Elsevier Interactive Patient Education [employer]

## 2015-06-29 NOTE — Discharge Summary (Signed)
Family Medicine Teaching [facility] Discharge Summary  Patient name: Christine Benitez Medical record number: [id number] Date of birth: [birth year] Age: 55 y.o. Gender: female Date of Admission: 06/26/2015  Date of Discharge: 06/30/2015 Admitting Physician: Latrelle Dodrill, MD  Primary Care Provider: Ailene Ravel, MD Consultants: None   Indication for Hospitalization: DKA   Discharge Diagnoses/Problem List:    Disposition: Home   Discharge Condition: Stable  Discharge Exam:  General: NAD, sitting on side of bed eating breakfast Cardiovascular: RRR, no murmurs, rubs, or gallops  Respiratory: CTAB, no rhonchi, wheeze Abdomen: BS+, no ttp, no rebound  Extremities:No lower extremity edema.   Brief Hospital Course:  Patient was admitted for DKA with an anion gap of 20 and bicarbonate of 18; therefore likely with metabolic acidosis. Patient states that her insulin was reduced from last hospitalization  from which she was discharged on Friday and patient did not take her insulin on Sunday or Monday as she felt bad, likely exacerbating patient's hyperglycemia. Patient received 2 boluses of normal saline in the ED of 250 mL. Was admitted with an insulin drip and carefully titrated off once anion gap was closed x 2 and started of subQ insulin. Patient also had electrolyte abnormalities, these were corrected. Fluid  resuscitation was considered with caution, as patient has a history of with an ejection fraction of 30-35%.  Patient was found to have an acute kidney injury this resolved back to baseline following fluid resuscitation. Diabetes education was consulted.  patient was discharged on an increased regiment of 70/30 from admission. Patient was restarted on home meds and was ready for discharge.    Issues for Follow Up:  1. Recheck blood sugars patient was increased to 70/30 Novolin 25 units  in the AM, and 20 units in the pm. Need to recheck A1C in the next 2 months  2. Would  check BMET to renal function  Significant Procedures: None   Significant Labs and Imaging:   Recent Labs Lab 06/26/15 2002 06/27/15 1109 06/28/15 1032  WBC 8.8 7.0 6.7  HGB 17.3* 16.2* 15.7*  HCT 48.3* 45.9 47.1*  PLT 268 268 270    Recent Labs Lab 06/26/15 2002 06/27/15 0102 06/27/15 0641 06/27/15 1450 06/27/15 2317 06/28/15 0241 06/29/15 0520  NA 127* 131* 133* 131* 131* 134* 133*  K 4.9 5.0 4.3 4.4 4.6 4.1 4.9  CL 89* 98* 99* 96* 97* 102 102  CO2 18* 20* 23 20* 23 22 22   GLUCOSE 404* 228* 176* 354* 386* 135* 211*  BUN 75* 67* 62* 54* 49* 46* 28*  CREATININE 2.66* 1.96* 1.86* 1.55* 1.52* 1.35* 1.12*  CALCIUM 10.1 8.9 9.2 8.9 8.9 9.1 9.2  MG  --  2.0 2.3  --   --   --   --   ALKPHOS 153*  --   --   --   --   --   --   AST 26  --   --   --   --   --   --   ALT 19  --   --   --   --   --   --   ALBUMIN 3.6  --   --   --   --   --   --      Results/Tests Pending at Time of Discharge:    Discharge Medications:    Medication List    STOP taking these medications        insulin detemir 100 UNIT/ML  injection  Commonly known as:  LEVEMIR      TAKE these medications        aspirin 81 MG EC tablet  TAKE 1 TABLET (81 MG TOTAL) BY MOUTH DAILY.     atorvastatin 20 MG tablet  Commonly known as:  LIPITOR  TAKE 1 TABLET (20 MG TOTAL) BY MOUTH EVERY EVENING.     carvedilol 3.125 MG tablet  Commonly known as:  COREG  Take 1 tablet (3.125 mg total) by mouth 2 (two) times daily with a meal.     enalapril 2.5 MG tablet  Commonly known as:  VASOTEC  TAKE 1 TABLET (2.5 MG TOTAL) BY MOUTH 2 (TWO) TIMES DAILY.     furosemide 20 MG tablet  Commonly known as:  LASIX  Take 1 tablet (20 mg total) by mouth daily. Appointment needed for future refills     insulin NPH-regular Human (70-30) 100 UNIT/ML injection  Commonly known as:  NOVOLIN 70/30  Take 25 units with breakfast and 20 units with dinner daily        Discharge Instructions: Please refer to Patient  Instructions section of EMR for full details.  Patient was counseled important signs and symptoms that should prompt return to medical care, changes in medications, dietary instructions, activity restrictions, and follow up appointments.   Follow-Up Appointments: Follow-up Information    Schedule an appointment as soon as possible for a visit with Ailene Ravel, MD.   Specialty:  Family Medicine   Why:  For hospital follow-up; 06-30-15 APPOINTMENT NOTE--Thursday, 07-03-15 as setup by patient   Contact information:   [address] [place] [address] [phone]       Berton Bon, MD 07/01/2015, 6:16 PM PGY-1, [facility]

## 2015-06-30 ENCOUNTER — Ambulatory Visit: Payer: BLUE CROSS/BLUE SHIELD | Admitting: Physician Assistant

## 2015-07-02 ENCOUNTER — Encounter: Payer: Self-pay | Admitting: *Deleted

## 2015-07-17 ENCOUNTER — Other Ambulatory Visit: Payer: Self-pay | Admitting: Cardiology

## 2015-07-17 NOTE — Telephone Encounter (Signed)
Rx(s) sent to pharmacy electronically.  

## 2015-07-22 ENCOUNTER — Other Ambulatory Visit: Payer: Self-pay | Admitting: Cardiology

## 2015-07-22 NOTE — Telephone Encounter (Signed)
REFILL 

## 2015-08-17 ENCOUNTER — Other Ambulatory Visit: Payer: Self-pay | Admitting: Family Medicine

## 2015-08-18 ENCOUNTER — Other Ambulatory Visit: Payer: Self-pay | Admitting: Cardiology

## 2016-06-17 DIAGNOSIS — E119 Type 2 diabetes mellitus without complications: Secondary | ICD-10-CM | POA: Diagnosis not present

## 2016-06-17 DIAGNOSIS — E785 Hyperlipidemia, unspecified: Secondary | ICD-10-CM | POA: Diagnosis not present

## 2016-06-17 DIAGNOSIS — I509 Heart failure, unspecified: Secondary | ICD-10-CM | POA: Diagnosis not present

## 2016-06-17 DIAGNOSIS — I428 Other cardiomyopathies: Secondary | ICD-10-CM | POA: Diagnosis not present

## 2016-06-17 DIAGNOSIS — I272 Pulmonary hypertension, unspecified: Secondary | ICD-10-CM | POA: Diagnosis not present

## 2016-11-19 DIAGNOSIS — E1165 Type 2 diabetes mellitus with hyperglycemia: Secondary | ICD-10-CM | POA: Diagnosis not present

## 2016-11-19 DIAGNOSIS — I509 Heart failure, unspecified: Secondary | ICD-10-CM | POA: Diagnosis not present

## 2016-11-19 DIAGNOSIS — I272 Pulmonary hypertension, unspecified: Secondary | ICD-10-CM | POA: Diagnosis not present

## 2016-11-19 DIAGNOSIS — Z1389 Encounter for screening for other disorder: Secondary | ICD-10-CM | POA: Diagnosis not present

## 2016-11-19 DIAGNOSIS — J4520 Mild intermittent asthma, uncomplicated: Secondary | ICD-10-CM | POA: Diagnosis not present

## 2017-06-14 DIAGNOSIS — Z79899 Other long term (current) drug therapy: Secondary | ICD-10-CM | POA: Diagnosis not present

## 2017-06-14 DIAGNOSIS — S134XXA Sprain of ligaments of cervical spine, initial encounter: Secondary | ICD-10-CM | POA: Diagnosis not present

## 2017-06-14 DIAGNOSIS — Z7982 Long term (current) use of aspirin: Secondary | ICD-10-CM | POA: Diagnosis not present

## 2017-06-14 DIAGNOSIS — Z794 Long term (current) use of insulin: Secondary | ICD-10-CM | POA: Diagnosis not present

## 2017-06-14 DIAGNOSIS — M542 Cervicalgia: Secondary | ICD-10-CM | POA: Diagnosis not present

## 2017-06-14 DIAGNOSIS — Z041 Encounter for examination and observation following transport accident: Secondary | ICD-10-CM | POA: Diagnosis not present

## 2017-06-14 DIAGNOSIS — E119 Type 2 diabetes mellitus without complications: Secondary | ICD-10-CM | POA: Diagnosis not present

## 2017-06-14 DIAGNOSIS — S199XXA Unspecified injury of neck, initial encounter: Secondary | ICD-10-CM | POA: Diagnosis not present

## 2017-06-14 DIAGNOSIS — F17210 Nicotine dependence, cigarettes, uncomplicated: Secondary | ICD-10-CM | POA: Diagnosis not present

## 2017-06-14 DIAGNOSIS — R739 Hyperglycemia, unspecified: Secondary | ICD-10-CM | POA: Diagnosis not present

## 2017-06-14 DIAGNOSIS — S0990XA Unspecified injury of head, initial encounter: Secondary | ICD-10-CM | POA: Diagnosis not present

## 2017-06-14 DIAGNOSIS — I509 Heart failure, unspecified: Secondary | ICD-10-CM | POA: Diagnosis not present

## 2017-08-15 DIAGNOSIS — E1165 Type 2 diabetes mellitus with hyperglycemia: Secondary | ICD-10-CM | POA: Diagnosis not present

## 2017-08-15 DIAGNOSIS — I272 Pulmonary hypertension, unspecified: Secondary | ICD-10-CM | POA: Diagnosis not present

## 2017-08-15 DIAGNOSIS — I509 Heart failure, unspecified: Secondary | ICD-10-CM | POA: Diagnosis not present

## 2017-08-15 DIAGNOSIS — J4520 Mild intermittent asthma, uncomplicated: Secondary | ICD-10-CM | POA: Diagnosis not present

## 2017-08-15 DIAGNOSIS — Z1331 Encounter for screening for depression: Secondary | ICD-10-CM | POA: Diagnosis not present

## 2018-07-03 DIAGNOSIS — I428 Other cardiomyopathies: Secondary | ICD-10-CM | POA: Diagnosis not present

## 2018-07-03 DIAGNOSIS — I509 Heart failure, unspecified: Secondary | ICD-10-CM | POA: Diagnosis not present

## 2018-07-03 DIAGNOSIS — R945 Abnormal results of liver function studies: Secondary | ICD-10-CM | POA: Diagnosis not present

## 2018-07-03 DIAGNOSIS — E1165 Type 2 diabetes mellitus with hyperglycemia: Secondary | ICD-10-CM | POA: Diagnosis not present

## 2018-12-12 DIAGNOSIS — Z03818 Encounter for observation for suspected exposure to other biological agents ruled out: Secondary | ICD-10-CM | POA: Diagnosis not present

## 2018-12-26 DIAGNOSIS — Z20828 Contact with and (suspected) exposure to other viral communicable diseases: Secondary | ICD-10-CM | POA: Diagnosis not present

## 2019-01-03 DIAGNOSIS — Z20828 Contact with and (suspected) exposure to other viral communicable diseases: Secondary | ICD-10-CM | POA: Diagnosis not present

## 2019-01-10 DIAGNOSIS — Z20828 Contact with and (suspected) exposure to other viral communicable diseases: Secondary | ICD-10-CM | POA: Diagnosis not present

## 2019-01-17 DIAGNOSIS — Z20828 Contact with and (suspected) exposure to other viral communicable diseases: Secondary | ICD-10-CM | POA: Diagnosis not present

## 2019-01-24 DIAGNOSIS — Z20828 Contact with and (suspected) exposure to other viral communicable diseases: Secondary | ICD-10-CM | POA: Diagnosis not present

## 2019-01-31 DIAGNOSIS — Z20828 Contact with and (suspected) exposure to other viral communicable diseases: Secondary | ICD-10-CM | POA: Diagnosis not present

## 2019-02-07 DIAGNOSIS — Z20828 Contact with and (suspected) exposure to other viral communicable diseases: Secondary | ICD-10-CM | POA: Diagnosis not present

## 2019-02-14 DIAGNOSIS — Z20828 Contact with and (suspected) exposure to other viral communicable diseases: Secondary | ICD-10-CM | POA: Diagnosis not present

## 2019-02-22 DIAGNOSIS — Z20828 Contact with and (suspected) exposure to other viral communicable diseases: Secondary | ICD-10-CM | POA: Diagnosis not present

## 2019-03-21 DIAGNOSIS — Z20828 Contact with and (suspected) exposure to other viral communicable diseases: Secondary | ICD-10-CM | POA: Diagnosis not present

## 2019-03-28 DIAGNOSIS — Z20828 Contact with and (suspected) exposure to other viral communicable diseases: Secondary | ICD-10-CM | POA: Diagnosis not present

## 2019-04-03 DIAGNOSIS — Z20828 Contact with and (suspected) exposure to other viral communicable diseases: Secondary | ICD-10-CM | POA: Diagnosis not present

## 2019-04-11 DIAGNOSIS — Z20828 Contact with and (suspected) exposure to other viral communicable diseases: Secondary | ICD-10-CM | POA: Diagnosis not present

## 2019-04-19 DIAGNOSIS — Z20828 Contact with and (suspected) exposure to other viral communicable diseases: Secondary | ICD-10-CM | POA: Diagnosis not present

## 2019-04-25 DIAGNOSIS — Z03818 Encounter for observation for suspected exposure to other biological agents ruled out: Secondary | ICD-10-CM | POA: Diagnosis not present

## 2019-04-30 DIAGNOSIS — Z20828 Contact with and (suspected) exposure to other viral communicable diseases: Secondary | ICD-10-CM | POA: Diagnosis not present

## 2019-05-09 DIAGNOSIS — Z20828 Contact with and (suspected) exposure to other viral communicable diseases: Secondary | ICD-10-CM | POA: Diagnosis not present

## 2019-05-16 DIAGNOSIS — Z20828 Contact with and (suspected) exposure to other viral communicable diseases: Secondary | ICD-10-CM | POA: Diagnosis not present

## 2019-05-21 DIAGNOSIS — Z20828 Contact with and (suspected) exposure to other viral communicable diseases: Secondary | ICD-10-CM | POA: Diagnosis not present

## 2019-05-24 DIAGNOSIS — Z20828 Contact with and (suspected) exposure to other viral communicable diseases: Secondary | ICD-10-CM | POA: Diagnosis not present

## 2019-05-28 DIAGNOSIS — Z20828 Contact with and (suspected) exposure to other viral communicable diseases: Secondary | ICD-10-CM | POA: Diagnosis not present

## 2019-06-04 DIAGNOSIS — Z20828 Contact with and (suspected) exposure to other viral communicable diseases: Secondary | ICD-10-CM | POA: Diagnosis not present

## 2019-06-07 DIAGNOSIS — Z20828 Contact with and (suspected) exposure to other viral communicable diseases: Secondary | ICD-10-CM | POA: Diagnosis not present

## 2019-06-18 DIAGNOSIS — Z1331 Encounter for screening for depression: Secondary | ICD-10-CM | POA: Diagnosis not present

## 2019-06-18 DIAGNOSIS — E118 Type 2 diabetes mellitus with unspecified complications: Secondary | ICD-10-CM | POA: Diagnosis not present

## 2019-06-18 DIAGNOSIS — R945 Abnormal results of liver function studies: Secondary | ICD-10-CM | POA: Diagnosis not present

## 2019-06-18 DIAGNOSIS — E119 Type 2 diabetes mellitus without complications: Secondary | ICD-10-CM | POA: Diagnosis not present

## 2019-06-18 DIAGNOSIS — Z20828 Contact with and (suspected) exposure to other viral communicable diseases: Secondary | ICD-10-CM | POA: Diagnosis not present

## 2019-06-18 DIAGNOSIS — I428 Other cardiomyopathies: Secondary | ICD-10-CM | POA: Diagnosis not present

## 2019-06-18 DIAGNOSIS — I509 Heart failure, unspecified: Secondary | ICD-10-CM | POA: Diagnosis not present

## 2019-06-25 DIAGNOSIS — Z20828 Contact with and (suspected) exposure to other viral communicable diseases: Secondary | ICD-10-CM | POA: Diagnosis not present

## 2019-07-02 DIAGNOSIS — Z20828 Contact with and (suspected) exposure to other viral communicable diseases: Secondary | ICD-10-CM | POA: Diagnosis not present

## 2019-07-09 DIAGNOSIS — Z20828 Contact with and (suspected) exposure to other viral communicable diseases: Secondary | ICD-10-CM | POA: Diagnosis not present

## 2019-08-06 DIAGNOSIS — Z03818 Encounter for observation for suspected exposure to other biological agents ruled out: Secondary | ICD-10-CM | POA: Diagnosis not present

## 2019-11-19 DIAGNOSIS — Z79899 Other long term (current) drug therapy: Secondary | ICD-10-CM | POA: Diagnosis not present

## 2019-11-19 DIAGNOSIS — J9811 Atelectasis: Secondary | ICD-10-CM | POA: Diagnosis not present

## 2019-11-19 DIAGNOSIS — R110 Nausea: Secondary | ICD-10-CM | POA: Diagnosis not present

## 2019-11-19 DIAGNOSIS — Z20822 Contact with and (suspected) exposure to covid-19: Secondary | ICD-10-CM | POA: Diagnosis not present

## 2019-11-19 DIAGNOSIS — Z794 Long term (current) use of insulin: Secondary | ICD-10-CM | POA: Diagnosis not present

## 2019-11-19 DIAGNOSIS — R0602 Shortness of breath: Secondary | ICD-10-CM | POA: Diagnosis not present

## 2019-11-19 DIAGNOSIS — I5033 Acute on chronic diastolic (congestive) heart failure: Secondary | ICD-10-CM | POA: Diagnosis not present

## 2019-11-19 DIAGNOSIS — F17210 Nicotine dependence, cigarettes, uncomplicated: Secondary | ICD-10-CM | POA: Diagnosis not present

## 2019-11-19 DIAGNOSIS — E119 Type 2 diabetes mellitus without complications: Secondary | ICD-10-CM | POA: Diagnosis not present

## 2019-11-19 DIAGNOSIS — Z7982 Long term (current) use of aspirin: Secondary | ICD-10-CM | POA: Diagnosis not present

## 2019-11-19 DIAGNOSIS — J449 Chronic obstructive pulmonary disease, unspecified: Secondary | ICD-10-CM | POA: Diagnosis not present

## 2019-11-19 DIAGNOSIS — I517 Cardiomegaly: Secondary | ICD-10-CM | POA: Diagnosis not present

## 2019-11-19 DIAGNOSIS — R05 Cough: Secondary | ICD-10-CM | POA: Diagnosis not present

## 2019-11-19 DIAGNOSIS — R197 Diarrhea, unspecified: Secondary | ICD-10-CM | POA: Diagnosis not present

## 2019-12-11 DIAGNOSIS — Z1152 Encounter for screening for COVID-19: Secondary | ICD-10-CM | POA: Diagnosis not present

## 2019-12-11 DIAGNOSIS — R05 Cough: Secondary | ICD-10-CM | POA: Diagnosis not present

## 2019-12-11 DIAGNOSIS — J189 Pneumonia, unspecified organism: Secondary | ICD-10-CM | POA: Diagnosis not present

## 2019-12-17 DIAGNOSIS — Z794 Long term (current) use of insulin: Secondary | ICD-10-CM | POA: Diagnosis not present

## 2019-12-17 DIAGNOSIS — K8000 Calculus of gallbladder with acute cholecystitis without obstruction: Secondary | ICD-10-CM | POA: Diagnosis not present

## 2019-12-17 DIAGNOSIS — K828 Other specified diseases of gallbladder: Secondary | ICD-10-CM | POA: Diagnosis not present

## 2019-12-17 DIAGNOSIS — Z823 Family history of stroke: Secondary | ICD-10-CM | POA: Diagnosis not present

## 2019-12-17 DIAGNOSIS — K8020 Calculus of gallbladder without cholecystitis without obstruction: Secondary | ICD-10-CM | POA: Diagnosis not present

## 2019-12-17 DIAGNOSIS — I5043 Acute on chronic combined systolic (congestive) and diastolic (congestive) heart failure: Secondary | ICD-10-CM | POA: Diagnosis not present

## 2019-12-17 DIAGNOSIS — R945 Abnormal results of liver function studies: Secondary | ICD-10-CM | POA: Diagnosis not present

## 2019-12-17 DIAGNOSIS — J189 Pneumonia, unspecified organism: Secondary | ICD-10-CM | POA: Diagnosis not present

## 2019-12-17 DIAGNOSIS — K838 Other specified diseases of biliary tract: Secondary | ICD-10-CM | POA: Diagnosis not present

## 2019-12-17 DIAGNOSIS — I509 Heart failure, unspecified: Secondary | ICD-10-CM | POA: Diagnosis not present

## 2019-12-17 DIAGNOSIS — I110 Hypertensive heart disease with heart failure: Secondary | ICD-10-CM | POA: Diagnosis not present

## 2019-12-17 DIAGNOSIS — F17210 Nicotine dependence, cigarettes, uncomplicated: Secondary | ICD-10-CM | POA: Diagnosis not present

## 2019-12-17 DIAGNOSIS — R748 Abnormal levels of other serum enzymes: Secondary | ICD-10-CM | POA: Diagnosis not present

## 2019-12-17 DIAGNOSIS — J90 Pleural effusion, not elsewhere classified: Secondary | ICD-10-CM | POA: Diagnosis not present

## 2019-12-17 DIAGNOSIS — J449 Chronic obstructive pulmonary disease, unspecified: Secondary | ICD-10-CM | POA: Diagnosis not present

## 2019-12-17 DIAGNOSIS — Z20822 Contact with and (suspected) exposure to covid-19: Secondary | ICD-10-CM | POA: Diagnosis not present

## 2019-12-17 DIAGNOSIS — R0602 Shortness of breath: Secondary | ICD-10-CM | POA: Diagnosis not present

## 2019-12-17 DIAGNOSIS — E162 Hypoglycemia, unspecified: Secondary | ICD-10-CM | POA: Diagnosis not present

## 2019-12-17 DIAGNOSIS — R21 Rash and other nonspecific skin eruption: Secondary | ICD-10-CM | POA: Diagnosis not present

## 2019-12-17 DIAGNOSIS — J9811 Atelectasis: Secondary | ICD-10-CM | POA: Diagnosis not present

## 2019-12-17 DIAGNOSIS — R7401 Elevation of levels of liver transaminase levels: Secondary | ICD-10-CM | POA: Diagnosis not present

## 2019-12-17 DIAGNOSIS — E161 Other hypoglycemia: Secondary | ICD-10-CM | POA: Diagnosis not present

## 2019-12-17 DIAGNOSIS — E119 Type 2 diabetes mellitus without complications: Secondary | ICD-10-CM | POA: Diagnosis not present

## 2019-12-17 DIAGNOSIS — H9190 Unspecified hearing loss, unspecified ear: Secondary | ICD-10-CM | POA: Diagnosis not present

## 2019-12-17 DIAGNOSIS — Z7982 Long term (current) use of aspirin: Secondary | ICD-10-CM | POA: Diagnosis not present

## 2019-12-18 DIAGNOSIS — E119 Type 2 diabetes mellitus without complications: Secondary | ICD-10-CM | POA: Diagnosis not present

## 2019-12-18 DIAGNOSIS — R0602 Shortness of breath: Secondary | ICD-10-CM | POA: Diagnosis not present

## 2019-12-18 DIAGNOSIS — K8020 Calculus of gallbladder without cholecystitis without obstruction: Secondary | ICD-10-CM | POA: Diagnosis not present

## 2019-12-18 DIAGNOSIS — R945 Abnormal results of liver function studies: Secondary | ICD-10-CM | POA: Diagnosis not present

## 2019-12-18 DIAGNOSIS — J189 Pneumonia, unspecified organism: Secondary | ICD-10-CM | POA: Diagnosis not present

## 2019-12-18 DIAGNOSIS — I110 Hypertensive heart disease with heart failure: Secondary | ICD-10-CM | POA: Diagnosis not present

## 2020-01-18 DIAGNOSIS — J189 Pneumonia, unspecified organism: Secondary | ICD-10-CM | POA: Diagnosis not present

## 2020-01-18 DIAGNOSIS — I509 Heart failure, unspecified: Secondary | ICD-10-CM | POA: Diagnosis not present

## 2020-01-18 DIAGNOSIS — N1831 Chronic kidney disease, stage 3a: Secondary | ICD-10-CM | POA: Diagnosis not present

## 2020-01-18 DIAGNOSIS — E118 Type 2 diabetes mellitus with unspecified complications: Secondary | ICD-10-CM | POA: Diagnosis not present

## 2020-03-05 DIAGNOSIS — N1832 Chronic kidney disease, stage 3b: Secondary | ICD-10-CM | POA: Diagnosis not present

## 2020-03-05 DIAGNOSIS — I5020 Unspecified systolic (congestive) heart failure: Secondary | ICD-10-CM | POA: Diagnosis not present

## 2020-03-05 DIAGNOSIS — R945 Abnormal results of liver function studies: Secondary | ICD-10-CM | POA: Diagnosis not present

## 2020-03-05 DIAGNOSIS — R600 Localized edema: Secondary | ICD-10-CM | POA: Diagnosis not present

## 2020-03-09 DIAGNOSIS — R748 Abnormal levels of other serum enzymes: Secondary | ICD-10-CM | POA: Diagnosis not present

## 2020-03-09 DIAGNOSIS — K8020 Calculus of gallbladder without cholecystitis without obstruction: Secondary | ICD-10-CM | POA: Diagnosis not present

## 2020-03-09 DIAGNOSIS — R945 Abnormal results of liver function studies: Secondary | ICD-10-CM | POA: Diagnosis not present

## 2020-03-09 DIAGNOSIS — J90 Pleural effusion, not elsewhere classified: Secondary | ICD-10-CM | POA: Diagnosis not present

## 2020-03-11 ENCOUNTER — Inpatient Hospital Stay (HOSPITAL_COMMUNITY): Payer: BC Managed Care – PPO

## 2020-03-11 ENCOUNTER — Emergency Department (HOSPITAL_COMMUNITY): Payer: BC Managed Care – PPO

## 2020-03-11 ENCOUNTER — Other Ambulatory Visit: Payer: Self-pay

## 2020-03-11 ENCOUNTER — Inpatient Hospital Stay (HOSPITAL_COMMUNITY)
Admission: EM | Admit: 2020-03-11 | Discharge: 2020-03-26 | DRG: 064 | Disposition: A | Discharge status: Skilled Nursing Facility | Payer: BC Managed Care – PPO | Attending: Internal Medicine | Admitting: Internal Medicine

## 2020-03-11 DIAGNOSIS — Z452 Encounter for adjustment and management of vascular access device: Secondary | ICD-10-CM

## 2020-03-11 DIAGNOSIS — Z833 Family history of diabetes mellitus: Secondary | ICD-10-CM

## 2020-03-11 DIAGNOSIS — R338 Other retention of urine: Secondary | ICD-10-CM | POA: Diagnosis not present

## 2020-03-11 DIAGNOSIS — G9340 Encephalopathy, unspecified: Secondary | ICD-10-CM | POA: Diagnosis not present

## 2020-03-11 DIAGNOSIS — E1165 Type 2 diabetes mellitus with hyperglycemia: Secondary | ICD-10-CM | POA: Diagnosis not present

## 2020-03-11 DIAGNOSIS — E43 Unspecified severe protein-calorie malnutrition: Secondary | ICD-10-CM | POA: Insufficient documentation

## 2020-03-11 DIAGNOSIS — E872 Acidosis: Secondary | ICD-10-CM | POA: Diagnosis not present

## 2020-03-11 DIAGNOSIS — I2510 Atherosclerotic heart disease of native coronary artery without angina pectoris: Secondary | ICD-10-CM | POA: Diagnosis present

## 2020-03-11 DIAGNOSIS — R570 Cardiogenic shock: Secondary | ICD-10-CM | POA: Diagnosis not present

## 2020-03-11 DIAGNOSIS — Z823 Family history of stroke: Secondary | ICD-10-CM

## 2020-03-11 DIAGNOSIS — I6389 Other cerebral infarction: Secondary | ICD-10-CM | POA: Diagnosis not present

## 2020-03-11 DIAGNOSIS — I428 Other cardiomyopathies: Secondary | ICD-10-CM | POA: Diagnosis present

## 2020-03-11 DIAGNOSIS — N1831 Chronic kidney disease, stage 3a: Secondary | ICD-10-CM | POA: Diagnosis present

## 2020-03-11 DIAGNOSIS — I6330 Cerebral infarction due to thrombosis of unspecified cerebral artery: Secondary | ICD-10-CM | POA: Diagnosis not present

## 2020-03-11 DIAGNOSIS — J90 Pleural effusion, not elsewhere classified: Secondary | ICD-10-CM | POA: Diagnosis not present

## 2020-03-11 DIAGNOSIS — E11649 Type 2 diabetes mellitus with hypoglycemia without coma: Secondary | ICD-10-CM | POA: Diagnosis not present

## 2020-03-11 DIAGNOSIS — J9811 Atelectasis: Secondary | ICD-10-CM | POA: Diagnosis not present

## 2020-03-11 DIAGNOSIS — K811 Chronic cholecystitis: Secondary | ICD-10-CM | POA: Diagnosis not present

## 2020-03-11 DIAGNOSIS — I651 Occlusion and stenosis of basilar artery: Secondary | ICD-10-CM | POA: Diagnosis present

## 2020-03-11 DIAGNOSIS — G9341 Metabolic encephalopathy: Secondary | ICD-10-CM | POA: Diagnosis not present

## 2020-03-11 DIAGNOSIS — R52 Pain, unspecified: Secondary | ICD-10-CM | POA: Diagnosis not present

## 2020-03-11 DIAGNOSIS — I639 Cerebral infarction, unspecified: Secondary | ICD-10-CM | POA: Diagnosis not present

## 2020-03-11 DIAGNOSIS — H9190 Unspecified hearing loss, unspecified ear: Secondary | ICD-10-CM | POA: Diagnosis present

## 2020-03-11 DIAGNOSIS — I130 Hypertensive heart and chronic kidney disease with heart failure and stage 1 through stage 4 chronic kidney disease, or unspecified chronic kidney disease: Secondary | ICD-10-CM | POA: Diagnosis not present

## 2020-03-11 DIAGNOSIS — T730XXA Starvation, initial encounter: Secondary | ICD-10-CM | POA: Diagnosis present

## 2020-03-11 DIAGNOSIS — R739 Hyperglycemia, unspecified: Secondary | ICD-10-CM | POA: Diagnosis not present

## 2020-03-11 DIAGNOSIS — I10 Essential (primary) hypertension: Secondary | ICD-10-CM | POA: Diagnosis not present

## 2020-03-11 DIAGNOSIS — K8020 Calculus of gallbladder without cholecystitis without obstruction: Secondary | ICD-10-CM | POA: Diagnosis not present

## 2020-03-11 DIAGNOSIS — R279 Unspecified lack of coordination: Secondary | ICD-10-CM | POA: Diagnosis not present

## 2020-03-11 DIAGNOSIS — I6322 Cerebral infarction due to unspecified occlusion or stenosis of basilar arteries: Secondary | ICD-10-CM | POA: Diagnosis not present

## 2020-03-11 DIAGNOSIS — I5022 Chronic systolic (congestive) heart failure: Secondary | ICD-10-CM | POA: Diagnosis not present

## 2020-03-11 DIAGNOSIS — E1122 Type 2 diabetes mellitus with diabetic chronic kidney disease: Secondary | ICD-10-CM | POA: Diagnosis present

## 2020-03-11 DIAGNOSIS — J189 Pneumonia, unspecified organism: Secondary | ICD-10-CM | POA: Diagnosis not present

## 2020-03-11 DIAGNOSIS — E119 Type 2 diabetes mellitus without complications: Secondary | ICD-10-CM

## 2020-03-11 DIAGNOSIS — R0902 Hypoxemia: Secondary | ICD-10-CM | POA: Diagnosis not present

## 2020-03-11 DIAGNOSIS — Z20822 Contact with and (suspected) exposure to covid-19: Secondary | ICD-10-CM | POA: Diagnosis present

## 2020-03-11 DIAGNOSIS — Z789 Other specified health status: Secondary | ICD-10-CM

## 2020-03-11 DIAGNOSIS — G8194 Hemiplegia, unspecified affecting left nondominant side: Secondary | ICD-10-CM | POA: Diagnosis not present

## 2020-03-11 DIAGNOSIS — Z1389 Encounter for screening for other disorder: Secondary | ICD-10-CM

## 2020-03-11 DIAGNOSIS — I509 Heart failure, unspecified: Secondary | ICD-10-CM | POA: Diagnosis not present

## 2020-03-11 DIAGNOSIS — I69952 Hemiplegia and hemiparesis following unspecified cerebrovascular disease affecting left dominant side: Secondary | ICD-10-CM | POA: Diagnosis not present

## 2020-03-11 DIAGNOSIS — R339 Retention of urine, unspecified: Secondary | ICD-10-CM | POA: Diagnosis not present

## 2020-03-11 DIAGNOSIS — H02401 Unspecified ptosis of right eyelid: Secondary | ICD-10-CM | POA: Diagnosis present

## 2020-03-11 DIAGNOSIS — R531 Weakness: Secondary | ICD-10-CM | POA: Diagnosis not present

## 2020-03-11 DIAGNOSIS — E875 Hyperkalemia: Secondary | ICD-10-CM | POA: Diagnosis not present

## 2020-03-11 DIAGNOSIS — R471 Dysarthria and anarthria: Secondary | ICD-10-CM | POA: Diagnosis present

## 2020-03-11 DIAGNOSIS — Z7189 Other specified counseling: Secondary | ICD-10-CM | POA: Diagnosis not present

## 2020-03-11 DIAGNOSIS — K8012 Calculus of gallbladder with acute and chronic cholecystitis without obstruction: Secondary | ICD-10-CM | POA: Diagnosis present

## 2020-03-11 DIAGNOSIS — I5023 Acute on chronic systolic (congestive) heart failure: Secondary | ICD-10-CM | POA: Diagnosis not present

## 2020-03-11 DIAGNOSIS — I63412 Cerebral infarction due to embolism of left middle cerebral artery: Secondary | ICD-10-CM | POA: Diagnosis not present

## 2020-03-11 DIAGNOSIS — R54 Age-related physical debility: Secondary | ICD-10-CM | POA: Diagnosis present

## 2020-03-11 DIAGNOSIS — J9601 Acute respiratory failure with hypoxia: Secondary | ICD-10-CM | POA: Diagnosis not present

## 2020-03-11 DIAGNOSIS — Z794 Long term (current) use of insulin: Secondary | ICD-10-CM | POA: Diagnosis not present

## 2020-03-11 DIAGNOSIS — N179 Acute kidney failure, unspecified: Secondary | ICD-10-CM | POA: Diagnosis not present

## 2020-03-11 DIAGNOSIS — R4182 Altered mental status, unspecified: Secondary | ICD-10-CM | POA: Diagnosis not present

## 2020-03-11 DIAGNOSIS — Z7982 Long term (current) use of aspirin: Secondary | ICD-10-CM

## 2020-03-11 DIAGNOSIS — Z66 Do not resuscitate: Secondary | ICD-10-CM | POA: Diagnosis not present

## 2020-03-11 DIAGNOSIS — Z01818 Encounter for other preprocedural examination: Secondary | ICD-10-CM | POA: Diagnosis not present

## 2020-03-11 DIAGNOSIS — E861 Hypovolemia: Secondary | ICD-10-CM | POA: Diagnosis present

## 2020-03-11 DIAGNOSIS — R488 Other symbolic dysfunctions: Secondary | ICD-10-CM | POA: Diagnosis not present

## 2020-03-11 DIAGNOSIS — H9193 Unspecified hearing loss, bilateral: Secondary | ICD-10-CM | POA: Diagnosis not present

## 2020-03-11 DIAGNOSIS — I429 Cardiomyopathy, unspecified: Secondary | ICD-10-CM | POA: Diagnosis not present

## 2020-03-11 DIAGNOSIS — Z515 Encounter for palliative care: Secondary | ICD-10-CM | POA: Diagnosis not present

## 2020-03-11 DIAGNOSIS — Z79899 Other long term (current) drug therapy: Secondary | ICD-10-CM

## 2020-03-11 DIAGNOSIS — G459 Transient cerebral ischemic attack, unspecified: Secondary | ICD-10-CM | POA: Diagnosis not present

## 2020-03-11 DIAGNOSIS — I5020 Unspecified systolic (congestive) heart failure: Secondary | ICD-10-CM | POA: Diagnosis not present

## 2020-03-11 DIAGNOSIS — Z0189 Encounter for other specified special examinations: Secondary | ICD-10-CM

## 2020-03-11 DIAGNOSIS — J811 Chronic pulmonary edema: Secondary | ICD-10-CM | POA: Diagnosis not present

## 2020-03-11 DIAGNOSIS — Z8249 Family history of ischemic heart disease and other diseases of the circulatory system: Secondary | ICD-10-CM

## 2020-03-11 DIAGNOSIS — I43 Cardiomyopathy in diseases classified elsewhere: Secondary | ICD-10-CM | POA: Diagnosis present

## 2020-03-11 DIAGNOSIS — H5702 Anisocoria: Secondary | ICD-10-CM | POA: Diagnosis present

## 2020-03-11 DIAGNOSIS — F32A Depression, unspecified: Secondary | ICD-10-CM | POA: Diagnosis not present

## 2020-03-11 DIAGNOSIS — E785 Hyperlipidemia, unspecified: Secondary | ICD-10-CM | POA: Diagnosis present

## 2020-03-11 DIAGNOSIS — I6381 Other cerebral infarction due to occlusion or stenosis of small artery: Secondary | ICD-10-CM | POA: Diagnosis not present

## 2020-03-11 DIAGNOSIS — R410 Disorientation, unspecified: Secondary | ICD-10-CM | POA: Diagnosis not present

## 2020-03-11 DIAGNOSIS — Z87891 Personal history of nicotine dependence: Secondary | ICD-10-CM

## 2020-03-11 DIAGNOSIS — G3184 Mild cognitive impairment, so stated: Secondary | ICD-10-CM | POA: Diagnosis not present

## 2020-03-11 DIAGNOSIS — N1330 Unspecified hydronephrosis: Secondary | ICD-10-CM | POA: Diagnosis not present

## 2020-03-11 DIAGNOSIS — N320 Bladder-neck obstruction: Secondary | ICD-10-CM | POA: Diagnosis not present

## 2020-03-11 DIAGNOSIS — R29700 NIHSS score 0: Secondary | ICD-10-CM | POA: Diagnosis not present

## 2020-03-11 DIAGNOSIS — R000 Tachycardia, unspecified: Secondary | ICD-10-CM | POA: Diagnosis not present

## 2020-03-11 DIAGNOSIS — I6782 Cerebral ischemia: Secondary | ICD-10-CM | POA: Diagnosis not present

## 2020-03-11 DIAGNOSIS — I272 Pulmonary hypertension, unspecified: Secondary | ICD-10-CM | POA: Diagnosis present

## 2020-03-11 DIAGNOSIS — I63513 Cerebral infarction due to unspecified occlusion or stenosis of bilateral middle cerebral arteries: Secondary | ICD-10-CM | POA: Diagnosis not present

## 2020-03-11 DIAGNOSIS — R29810 Facial weakness: Secondary | ICD-10-CM | POA: Diagnosis present

## 2020-03-11 DIAGNOSIS — I110 Hypertensive heart disease with heart failure: Secondary | ICD-10-CM | POA: Diagnosis not present

## 2020-03-11 DIAGNOSIS — Z7401 Bed confinement status: Secondary | ICD-10-CM | POA: Diagnosis not present

## 2020-03-11 DIAGNOSIS — N139 Obstructive and reflux uropathy, unspecified: Secondary | ICD-10-CM | POA: Diagnosis not present

## 2020-03-11 DIAGNOSIS — G8929 Other chronic pain: Secondary | ICD-10-CM | POA: Diagnosis present

## 2020-03-11 DIAGNOSIS — K8010 Calculus of gallbladder with chronic cholecystitis without obstruction: Secondary | ICD-10-CM | POA: Diagnosis present

## 2020-03-11 DIAGNOSIS — I5021 Acute systolic (congestive) heart failure: Secondary | ICD-10-CM | POA: Diagnosis not present

## 2020-03-11 DIAGNOSIS — M2550 Pain in unspecified joint: Secondary | ICD-10-CM | POA: Diagnosis not present

## 2020-03-11 DIAGNOSIS — F339 Major depressive disorder, recurrent, unspecified: Secondary | ICD-10-CM | POA: Diagnosis not present

## 2020-03-11 DIAGNOSIS — I361 Nonrheumatic tricuspid (valve) insufficiency: Secondary | ICD-10-CM | POA: Diagnosis not present

## 2020-03-11 DIAGNOSIS — E1159 Type 2 diabetes mellitus with other circulatory complications: Secondary | ICD-10-CM | POA: Diagnosis not present

## 2020-03-11 DIAGNOSIS — R404 Transient alteration of awareness: Secondary | ICD-10-CM | POA: Diagnosis not present

## 2020-03-11 DIAGNOSIS — I878 Other specified disorders of veins: Secondary | ICD-10-CM | POA: Diagnosis not present

## 2020-03-11 DIAGNOSIS — I340 Nonrheumatic mitral (valve) insufficiency: Secondary | ICD-10-CM | POA: Diagnosis not present

## 2020-03-11 DIAGNOSIS — S0990XA Unspecified injury of head, initial encounter: Secondary | ICD-10-CM | POA: Diagnosis not present

## 2020-03-11 DIAGNOSIS — S199XXA Unspecified injury of neck, initial encounter: Secondary | ICD-10-CM | POA: Diagnosis not present

## 2020-03-11 DIAGNOSIS — N132 Hydronephrosis with renal and ureteral calculous obstruction: Secondary | ICD-10-CM | POA: Diagnosis not present

## 2020-03-11 DIAGNOSIS — R17 Unspecified jaundice: Secondary | ICD-10-CM

## 2020-03-11 LAB — COMPREHENSIVE METABOLIC PANEL
ALT: 16 U/L (ref 0–44)
AST: 27 U/L (ref 15–41)
Albumin: 3.1 g/dL — ABNORMAL LOW (ref 3.5–5.0)
Alkaline Phosphatase: 131 U/L — ABNORMAL HIGH (ref 38–126)
Anion gap: 20 — ABNORMAL HIGH (ref 5–15)
BUN: 21 mg/dL — ABNORMAL HIGH (ref 6–20)
CO2: 23 mmol/L (ref 22–32)
Calcium: 8.4 mg/dL — ABNORMAL LOW (ref 8.9–10.3)
Chloride: 95 mmol/L — ABNORMAL LOW (ref 98–111)
Creatinine, Ser: 1.43 mg/dL — ABNORMAL HIGH (ref 0.44–1.00)
GFR, Estimated: 43 mL/min — ABNORMAL LOW (ref >60)
Glucose, Bld: 322 mg/dL — ABNORMAL HIGH (ref 70–99)
Potassium: 3.4 mmol/L — ABNORMAL LOW (ref 3.5–5.1)
Sodium: 138 mmol/L (ref 135–145)
Total Bilirubin: 3.4 mg/dL — ABNORMAL HIGH (ref 0.3–1.2)
Total Protein: 6.8 g/dL (ref 6.5–8.1)

## 2020-03-11 LAB — I-STAT VENOUS BLOOD GAS, ED
Acid-Base Excess: 0 mmol/L (ref 0.0–2.0)
Bicarbonate: 23.6 mmol/L (ref 20.0–28.0)
Calcium, Ion: 0.85 mmol/L — CL (ref 1.15–1.40)
HCT: 52 % — ABNORMAL HIGH (ref 36.0–46.0)
Hemoglobin: 17.7 g/dL — ABNORMAL HIGH (ref 12.0–15.0)
O2 Saturation: 86 %
Potassium: 3.9 mmol/L (ref 3.5–5.1)
Sodium: 137 mmol/L (ref 135–145)
TCO2: 25 mmol/L (ref 22–32)
pCO2, Ven: 34.5 mmHg — ABNORMAL LOW (ref 44.0–60.0)
pH, Ven: 7.444 — ABNORMAL HIGH (ref 7.250–7.430)
pO2, Ven: 49 mmHg — ABNORMAL HIGH (ref 32.0–45.0)

## 2020-03-11 LAB — PROTIME-INR
INR: 1.2 (ref 0.8–1.2)
Prothrombin Time: 14.3 seconds (ref 11.4–15.2)

## 2020-03-11 LAB — ETHANOL: Alcohol, Ethyl (B): <10 mg/dL (ref <10)

## 2020-03-11 LAB — URINALYSIS, MICROSCOPIC (REFLEX): RBC / HPF: NONE SEEN RBC/hpf (ref 0–5)

## 2020-03-11 LAB — URINALYSIS, ROUTINE W REFLEX MICROSCOPIC
Bilirubin Urine: NEGATIVE
Glucose, UA: >=500 mg/dL — AB
Hgb urine dipstick: NEGATIVE
Ketones, ur: 15 mg/dL — AB
Leukocytes,Ua: NEGATIVE
Nitrite: NEGATIVE
Protein, ur: NEGATIVE mg/dL
Specific Gravity, Urine: 1.01 (ref 1.005–1.030)
pH: 7 (ref 5.0–8.0)

## 2020-03-11 LAB — CBC WITH DIFFERENTIAL/PLATELET
Abs Immature Granulocytes: 0.03 10*3/uL (ref 0.00–0.07)
Basophils Absolute: 0 10*3/uL (ref 0.0–0.1)
Basophils Relative: 0 %
Eosinophils Absolute: 0 10*3/uL (ref 0.0–0.5)
Eosinophils Relative: 0 %
HCT: 51.2 % — ABNORMAL HIGH (ref 36.0–46.0)
Hemoglobin: 16.1 g/dL — ABNORMAL HIGH (ref 12.0–15.0)
Immature Granulocytes: 0 %
Lymphocytes Relative: 14 %
Lymphs Abs: 1 10*3/uL (ref 0.7–4.0)
MCH: 30.8 pg (ref 26.0–34.0)
MCHC: 31.4 g/dL (ref 30.0–36.0)
MCV: 98.1 fL (ref 80.0–100.0)
Monocytes Absolute: 0.5 10*3/uL (ref 0.1–1.0)
Monocytes Relative: 7 %
Neutro Abs: 5.6 10*3/uL (ref 1.7–7.7)
Neutrophils Relative %: 79 %
Platelets: 251 10*3/uL (ref 150–400)
RBC: 5.22 MIL/uL — ABNORMAL HIGH (ref 3.87–5.11)
RDW: 18.7 % — ABNORMAL HIGH (ref 11.5–15.5)
WBC: 7.2 10*3/uL (ref 4.0–10.5)
nRBC: 0 % (ref 0.0–0.2)

## 2020-03-11 LAB — CBG MONITORING, ED
Glucose-Capillary: 308 mg/dL — ABNORMAL HIGH (ref 70–99)
Glucose-Capillary: 271 mg/dL — ABNORMAL HIGH (ref 70–99)

## 2020-03-11 LAB — RESPIRATORY PANEL BY RT PCR (FLU A&B, COVID)
Influenza A by PCR: NEGATIVE
Influenza B by PCR: NEGATIVE
SARS Coronavirus 2 by RT PCR: NEGATIVE

## 2020-03-11 LAB — AMMONIA: Ammonia: 18 umol/L (ref 9–35)

## 2020-03-11 LAB — CK: Total CK: 243 U/L — ABNORMAL HIGH (ref 38–234)

## 2020-03-11 LAB — LACTIC ACID, PLASMA
Lactic Acid, Venous: 3.7 mmol/L (ref 0.5–1.9)
Lactic Acid, Venous: 3.8 mmol/L (ref 0.5–1.9)

## 2020-03-11 IMAGING — CR DG ABDOMEN 1V
1 series · 1 of 1 positions shown · non-contrast
Comparison: None.

CLINICAL DATA: Preprocedure evaluation for MRI, altered level of
consciousness

EXAM:
ABDOMEN - 1 VIEW; PELVIS - 1-2 VIEW

[abdomen kub]
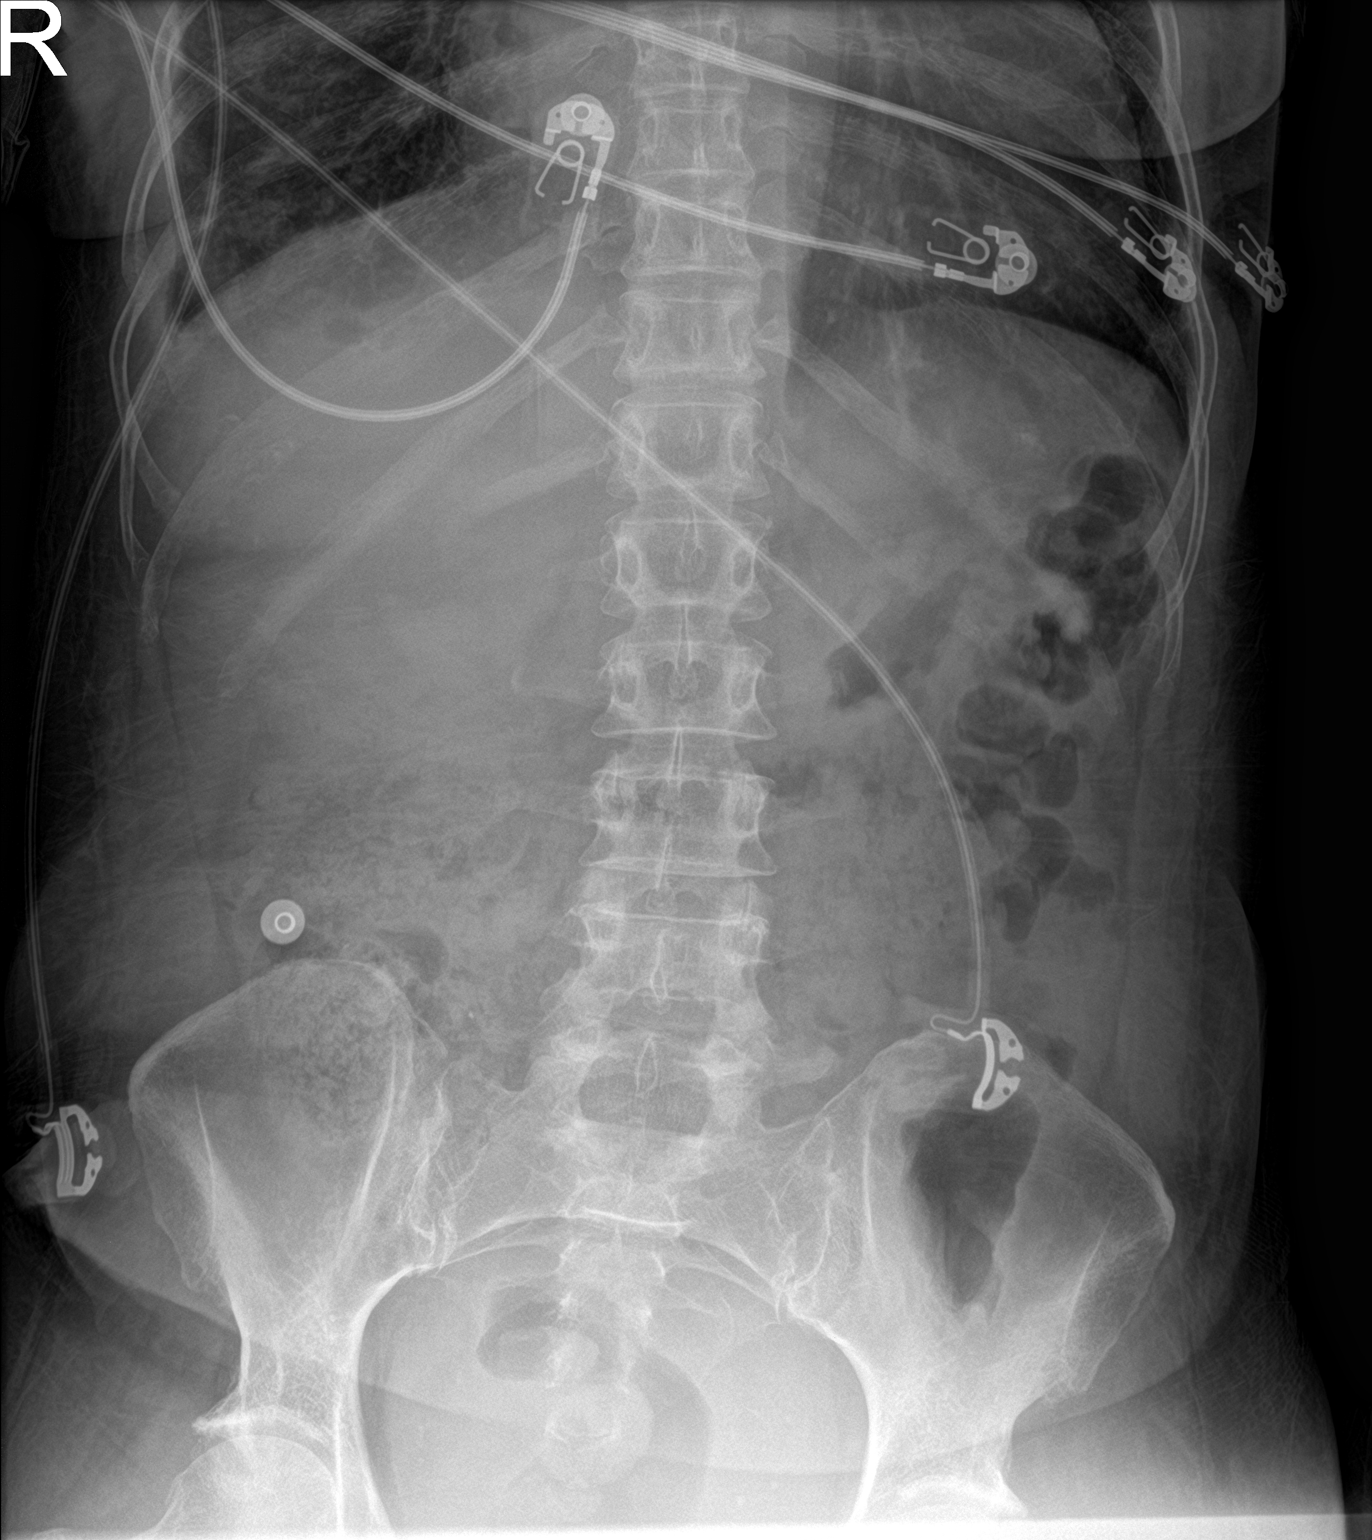

[1 of 1 positions shown; findings below may reference images not displayed]

FINDINGS: Abdomen: Supine frontal view of the abdomen demonstrates moderate
stool throughout the colon. Punctate calcification in the right
lower quadrant likely material within the fecal stream. There are no
radiopaque foreign bodies. No acute bony abnormalities.

Pelvis: Supine frontal view of the pelvis demonstrates an
unremarkable bowel gas pattern. Calcifications in the lower pelvis
are likely vascular. There are no radiopaque foreign bodies. No
acute displaced fractures.
IMPRESSION: 1. No radiopaque foreign body within the abdomen or pelvis.
2. Calcification in the right lower quadrant may be vascular or
material within the fecal stream. Pelvic calcifications are almost
certainly related to phleboliths.

## 2020-03-11 IMAGING — CR DG PELVIS 1-2V
1 series · 1 of 1 positions shown · non-contrast
Comparison: None.

CLINICAL DATA: Preprocedure evaluation for MRI, altered level of
consciousness

EXAM:
ABDOMEN - 1 VIEW; PELVIS - 1-2 VIEW

[pelvis ap]
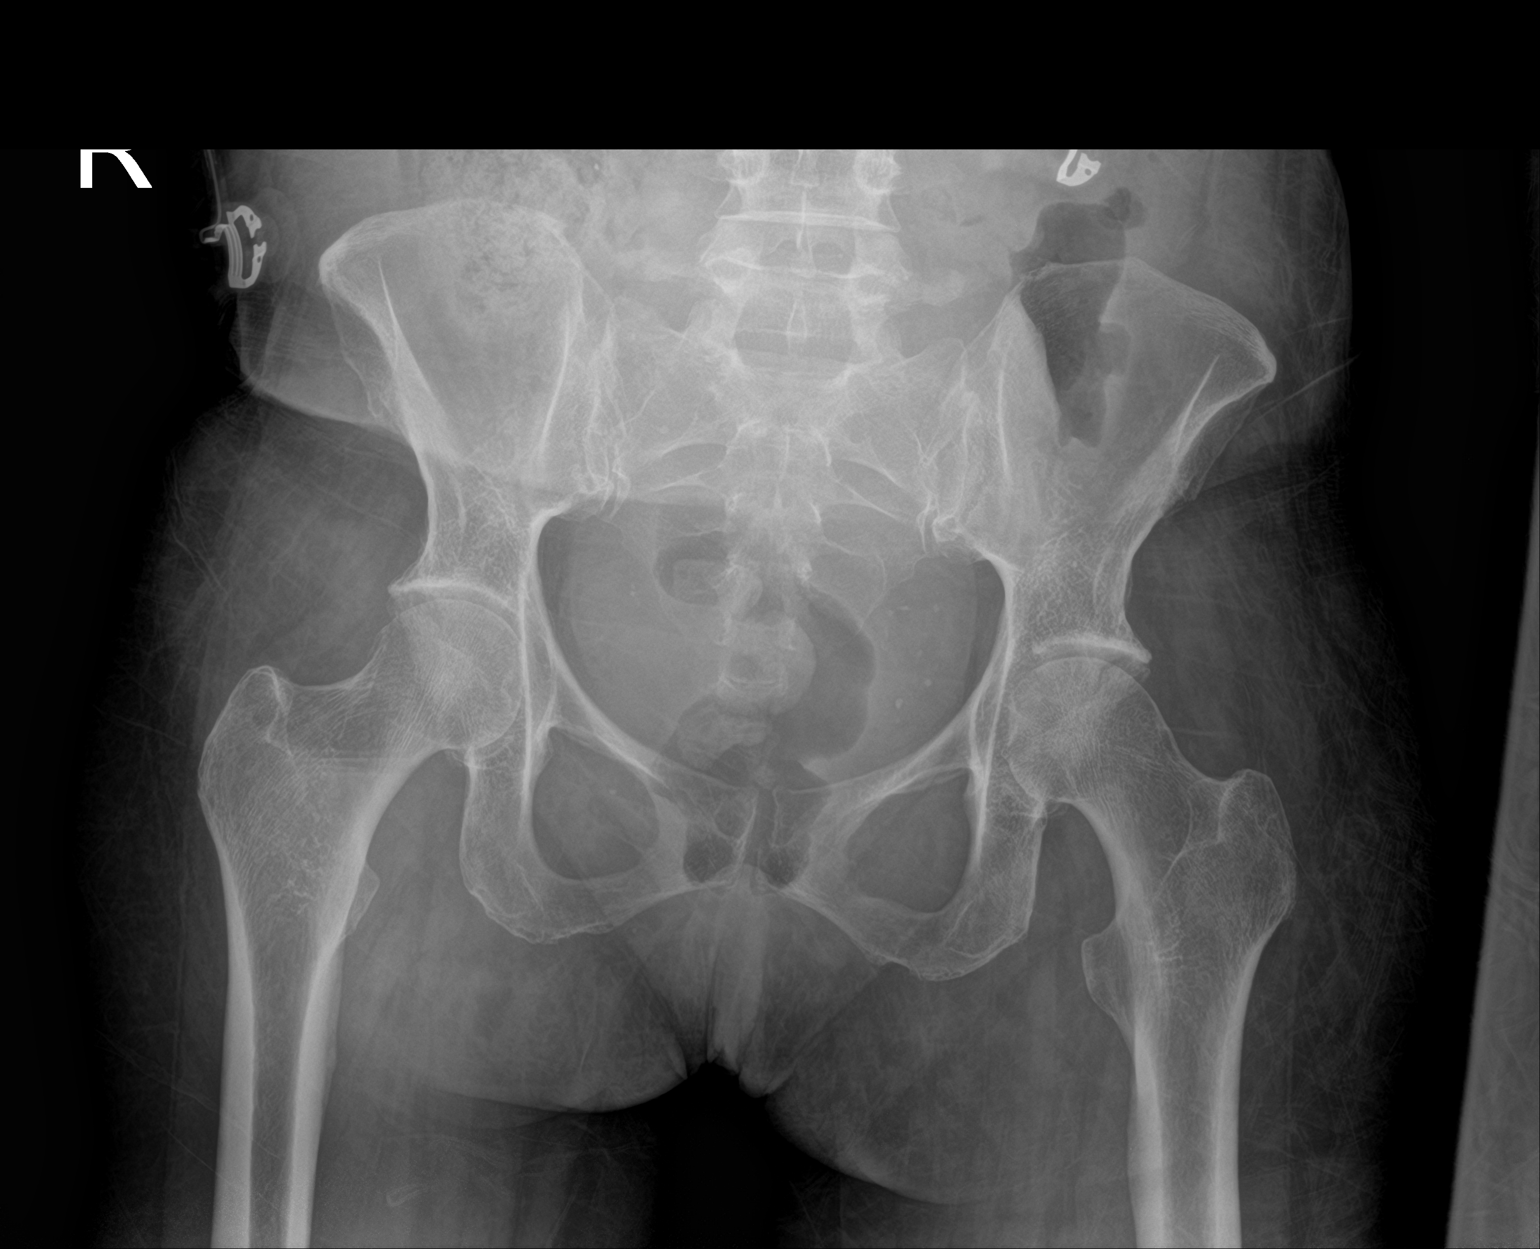

[1 of 1 positions shown; findings below may reference images not displayed]

FINDINGS: Abdomen: Supine frontal view of the abdomen demonstrates moderate
stool throughout the colon. Punctate calcification in the right
lower quadrant likely material within the fecal stream. There are no
radiopaque foreign bodies. No acute bony abnormalities.

Pelvis: Supine frontal view of the pelvis demonstrates an
unremarkable bowel gas pattern. Calcifications in the lower pelvis
are likely vascular. There are no radiopaque foreign bodies. No
acute displaced fractures.
IMPRESSION: 1. No radiopaque foreign body within the abdomen or pelvis.
2. Calcification in the right lower quadrant may be vascular or
material within the fecal stream. Pelvic calcifications are almost
certainly related to phleboliths.

## 2020-03-11 IMAGING — CR DG CHEST 1V
1 series · 1 of 1 positions shown · non-contrast
Comparison: [DATE] chest radiograph and prior.

CLINICAL DATA: Weakness.

EXAM:
CHEST  1 VIEW

[chest ap]
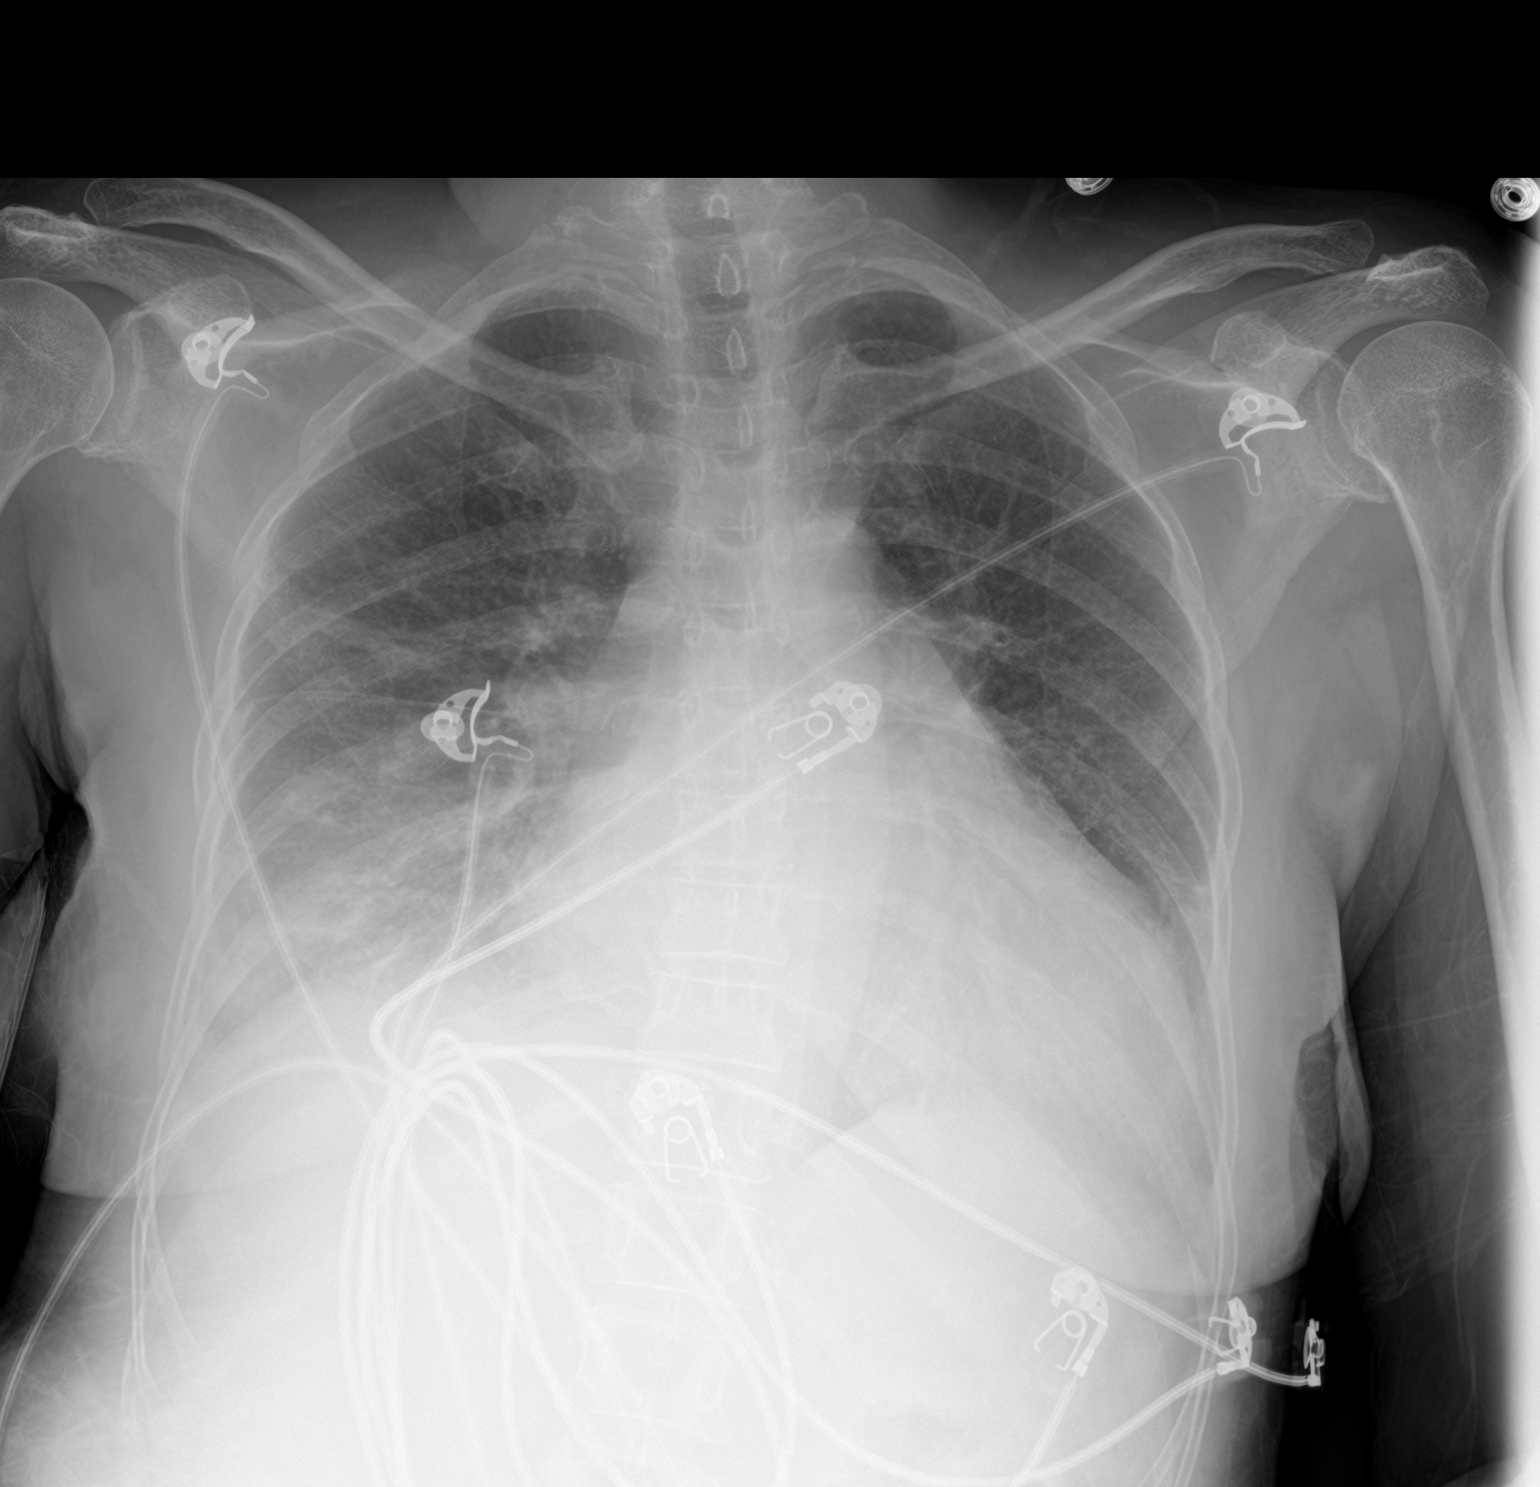

[1 of 1 positions shown; findings below may reference images not displayed]

FINDINGS: Grossly unchanged right mid to lower lung patchy opacities. New left
basilar opacities. No pneumothorax. Small right pleural effusion.
Cardiomegaly. No acute osseous abnormality.
IMPRESSION: Bibasilar opacities, new on the left since prior exam. Differential
includes infection, atelectasis and edema.

Cardiomegaly.

## 2020-03-11 IMAGING — CT CT HEAD W/O CM
3 series · 14 of 44 positions shown, 16 images · non-contrast
Comparison: None

CLINICAL DATA: Trauma to the head and neck. Abnormal mental status.

EXAM:
CT HEAD WITHOUT CONTRAST
CT CERVICAL SPINE WITHOUT CONTRAST
TECHNIQUE: Multidetector CT imaging of the head and cervical spine was
performed following the standard protocol without intravenous
contrast. Multiplanar CT image reconstructions of the cervical spine
were also generated.

[Series 3: head 5.0 h30s · axial · 0.43mm/px · z∈[-87,+23]mm · 8 of 27 slices shown, 10 images]
[im 3/27  brain]
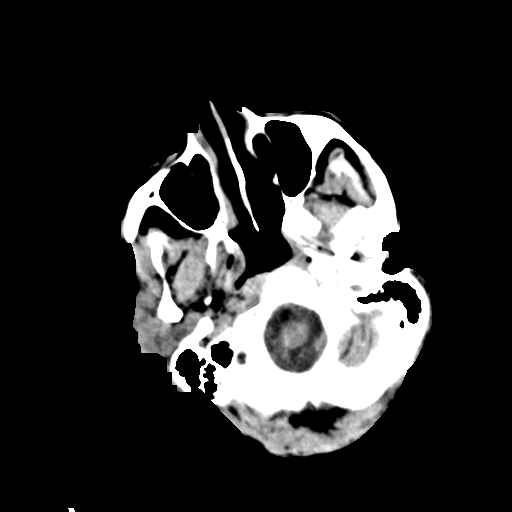
[im 3/27  bone]
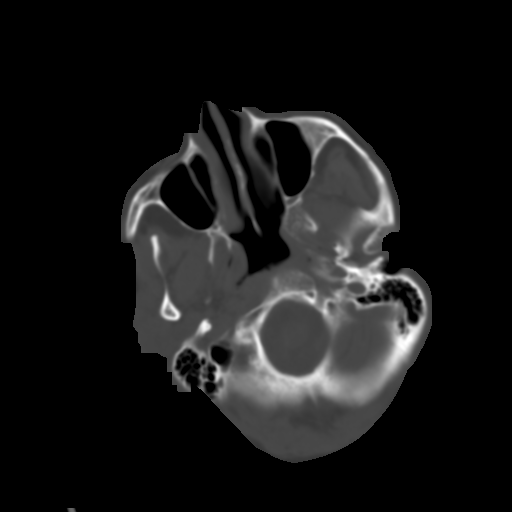
[im 6/27  brain]
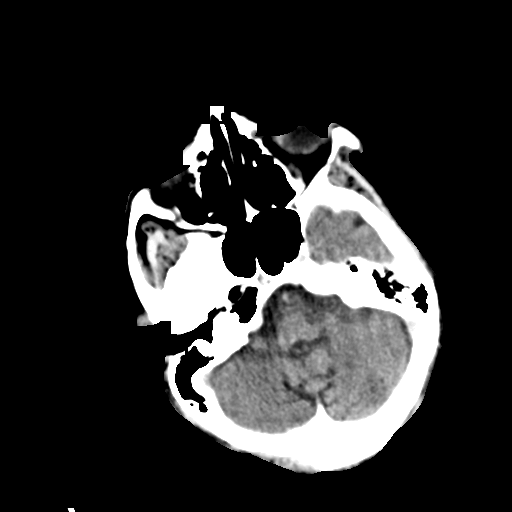
[im 9/27  brain]
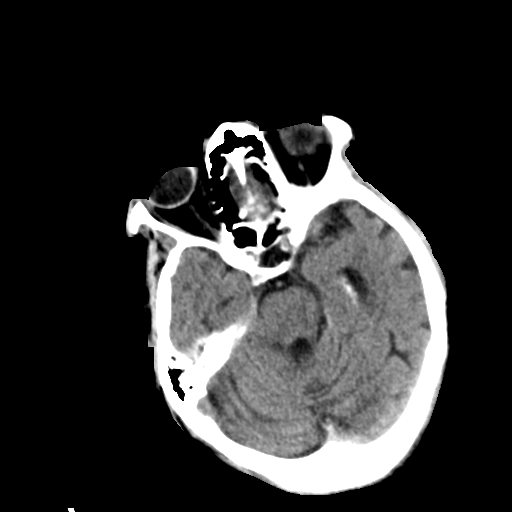
[im 12/27  brain]
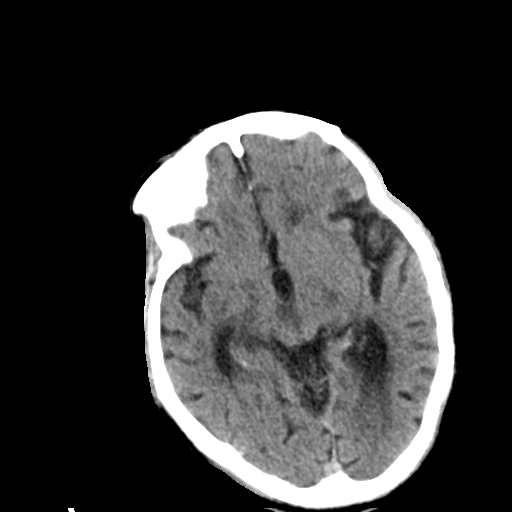
[im 16/27  brain]
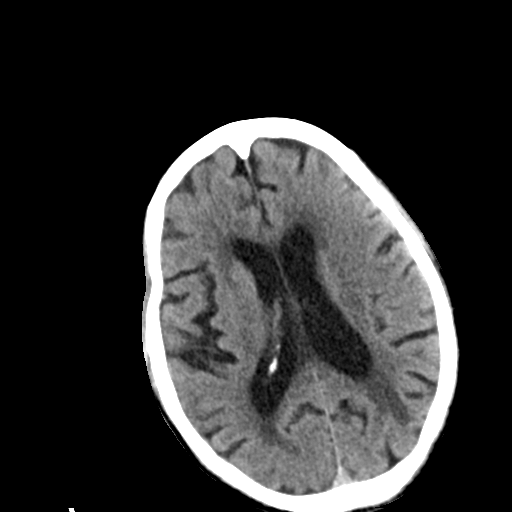
[im 16/27  bone]
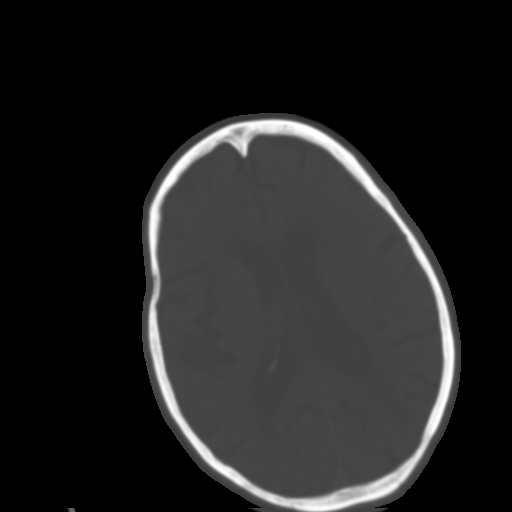
[im 19/27  brain]
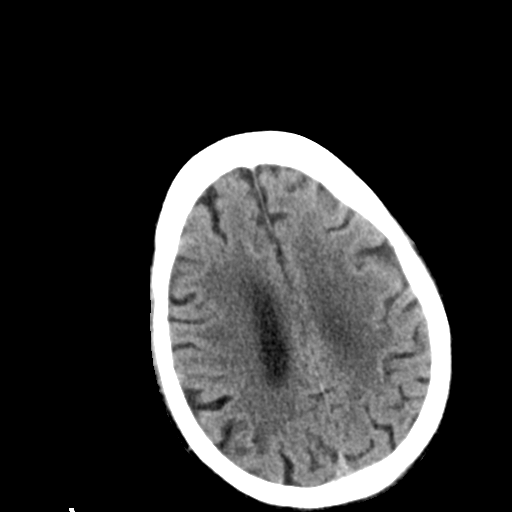
[im 22/27  brain]
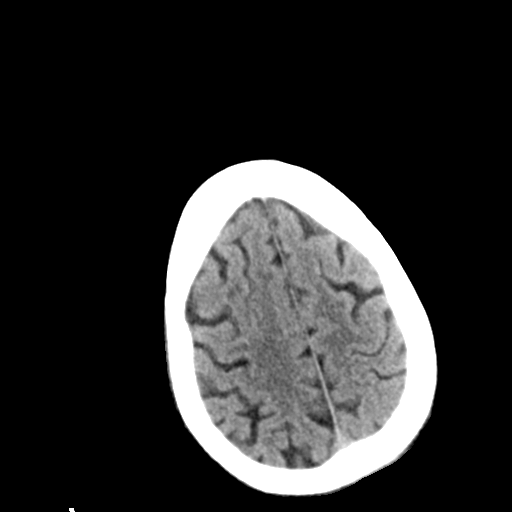
[im 25/27  brain]
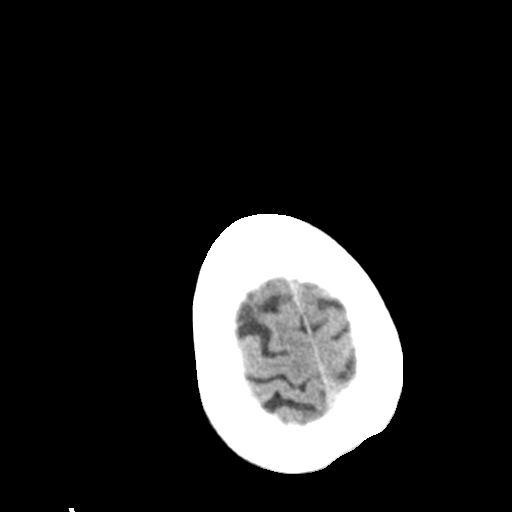

[Series 5: head 3.0 mpr cor · coronal · 0.30mm/px · 3 of 74 slices shown]
[im 25/74  brain]
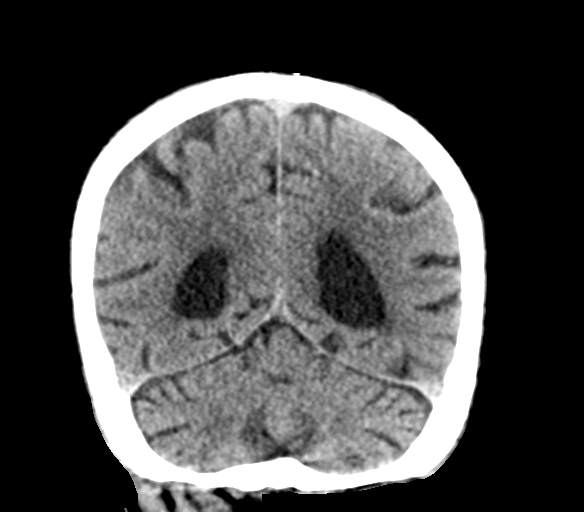
[im 33/74  brain]
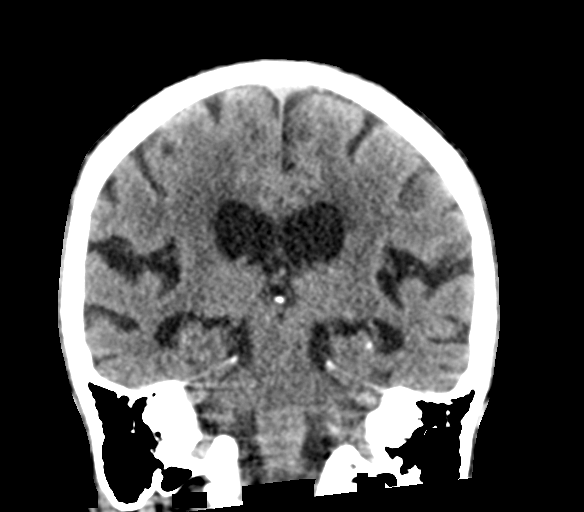
[im 41/74  brain]
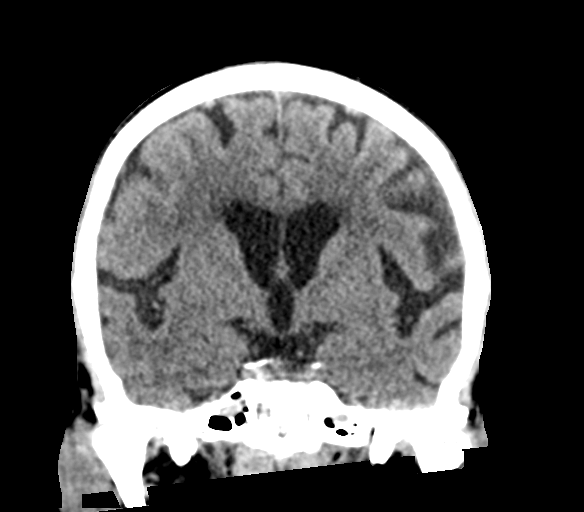

[Series 6: head 3.0 mpr sag · sagittal · 0.33mm/px · 3 of 58 slices shown]
[im 20/58  brain]
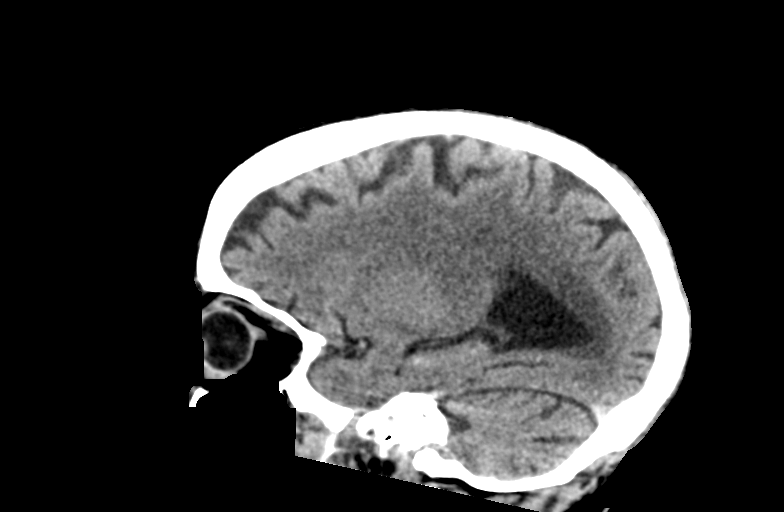
[im 29/58  brain]
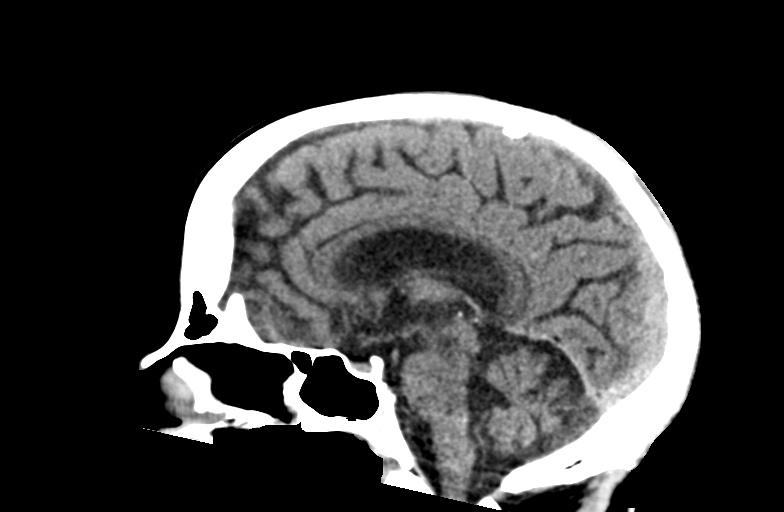
[im 39/58  brain]
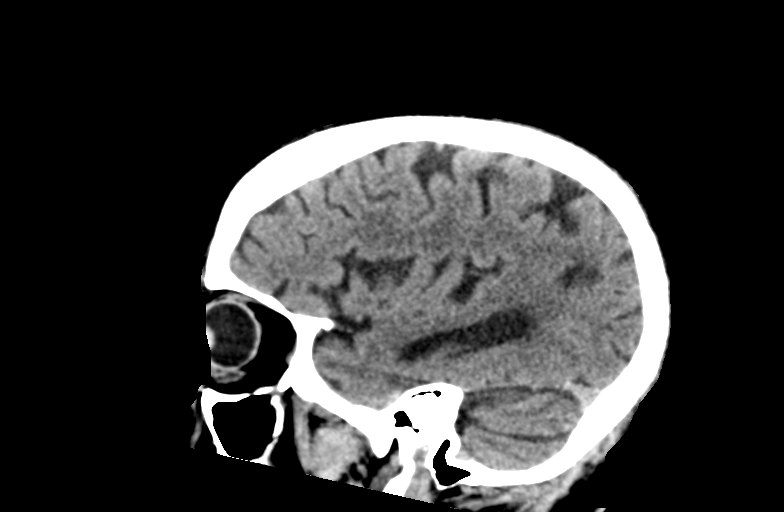

[14 of 44 positions shown; findings below may reference images not displayed]

FINDINGS: CT HEAD FINDINGS

Brain: Cerebellar atrophy with a few old small vessel cerebellar
infarctions. Low-density in the right thalamus that could indicate
acute or subacute right thalamic infarction. No hemorrhage. This
extends back into right mid brain. Cerebral hemispheres elsewhere
show chronic small-vessel ischemic change of the white matter. Old
left parietal cortical and subcortical infarction. No sign of mass
lesion, hemorrhage, hydrocephalus or extra-axial collection.

Vascular: There is atherosclerotic calcification of the major
vessels at the base of the brain.

Skull: Negative

Sinuses/Orbits: Clear/normal

Other: None

CT CERVICAL SPINE FINDINGS

Alignment: Normal

Skull base and vertebrae: No fracture or primary bone lesion. Some
motion degradation in the upper cervical region.

Soft tissues and spinal canal: No sign of soft tissue injury.

Disc levels: No significant degenerative disc disease is evident.
Ordinary facet osteoarthritis at C7-T1.

Upper chest: Mild pleural and parenchymal scarring at the apices.

Other: None
IMPRESSION: HEAD CT:

Low-density in the right thalamus of indeterminate age but that
could indicate acute or subacute right thalamic infarction. No
hemorrhage or mass effect. Old left parietal cortical and
subcortical infarction. Chronic small-vessel ischemic changes
elsewhere.

CERVICAL SPINE CT:

No acute or traumatic finding. Ordinary facet osteoarthritis at
C7-T1. Some motion degradation.

## 2020-03-11 IMAGING — CR DG HUMERUS 2V *L*
2 series · 2 of 2 positions shown · non-contrast
Comparison: None.

CLINICAL DATA: Weakness.

EXAM:
LEFT HUMERUS - 2+ VIEW

[humerus ap]
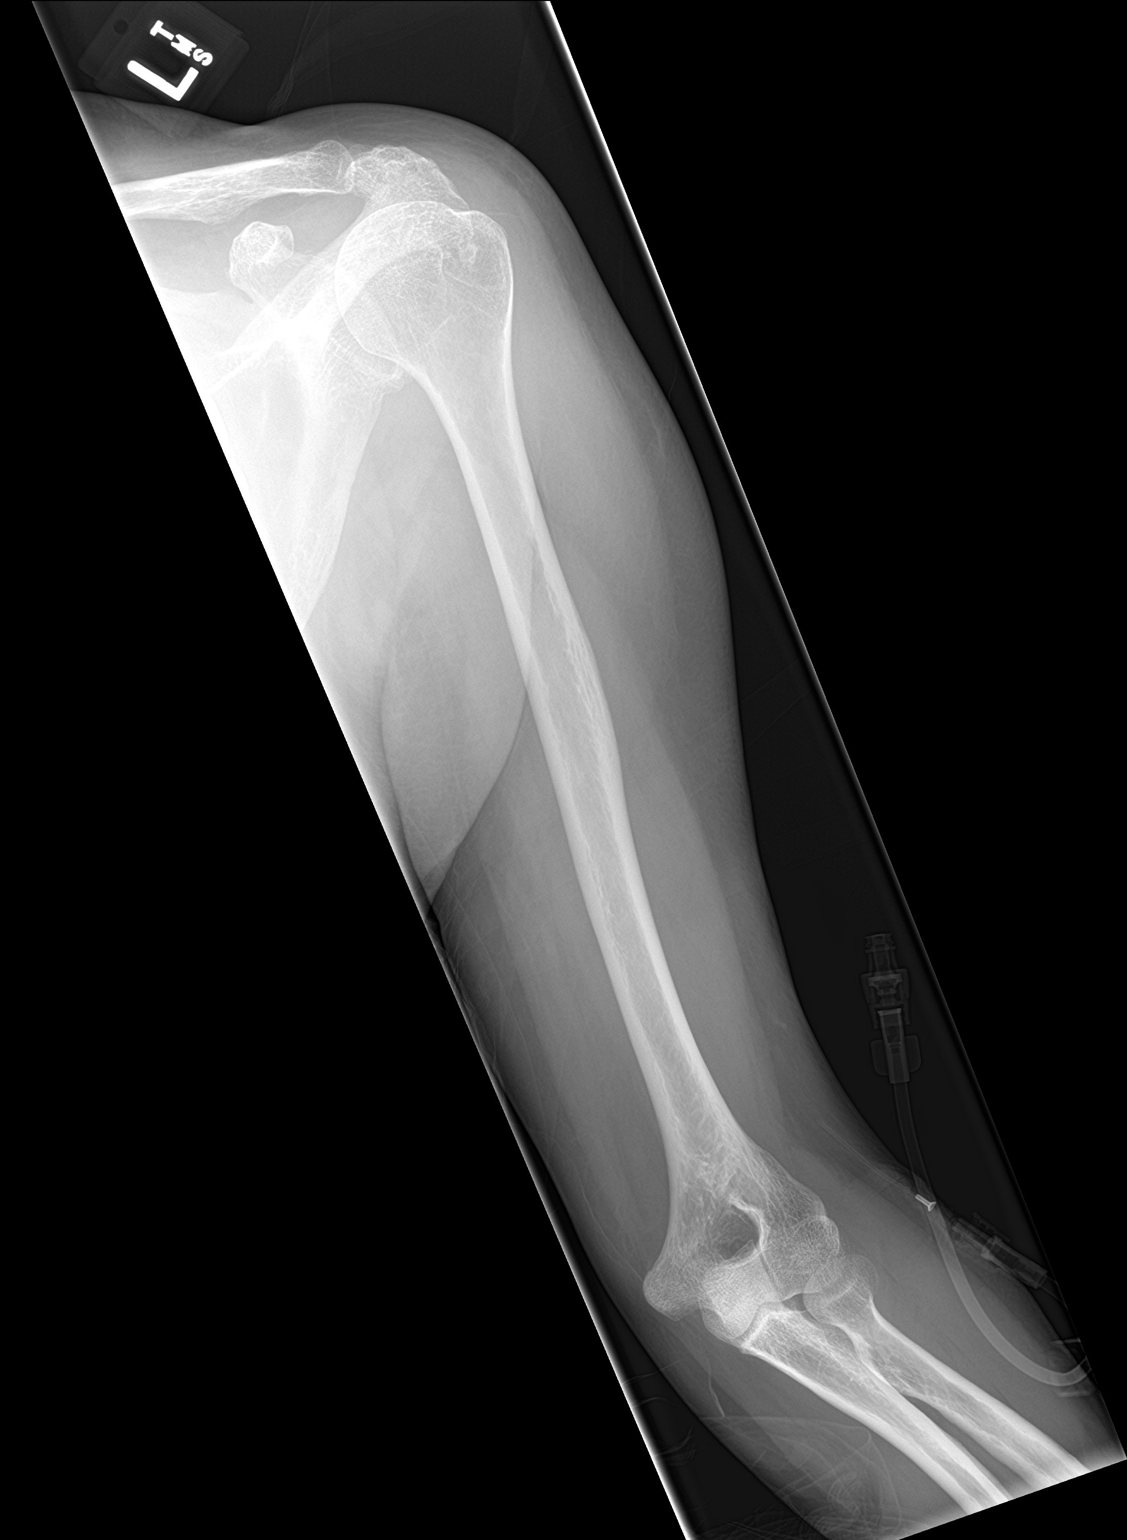

[humerus lat]
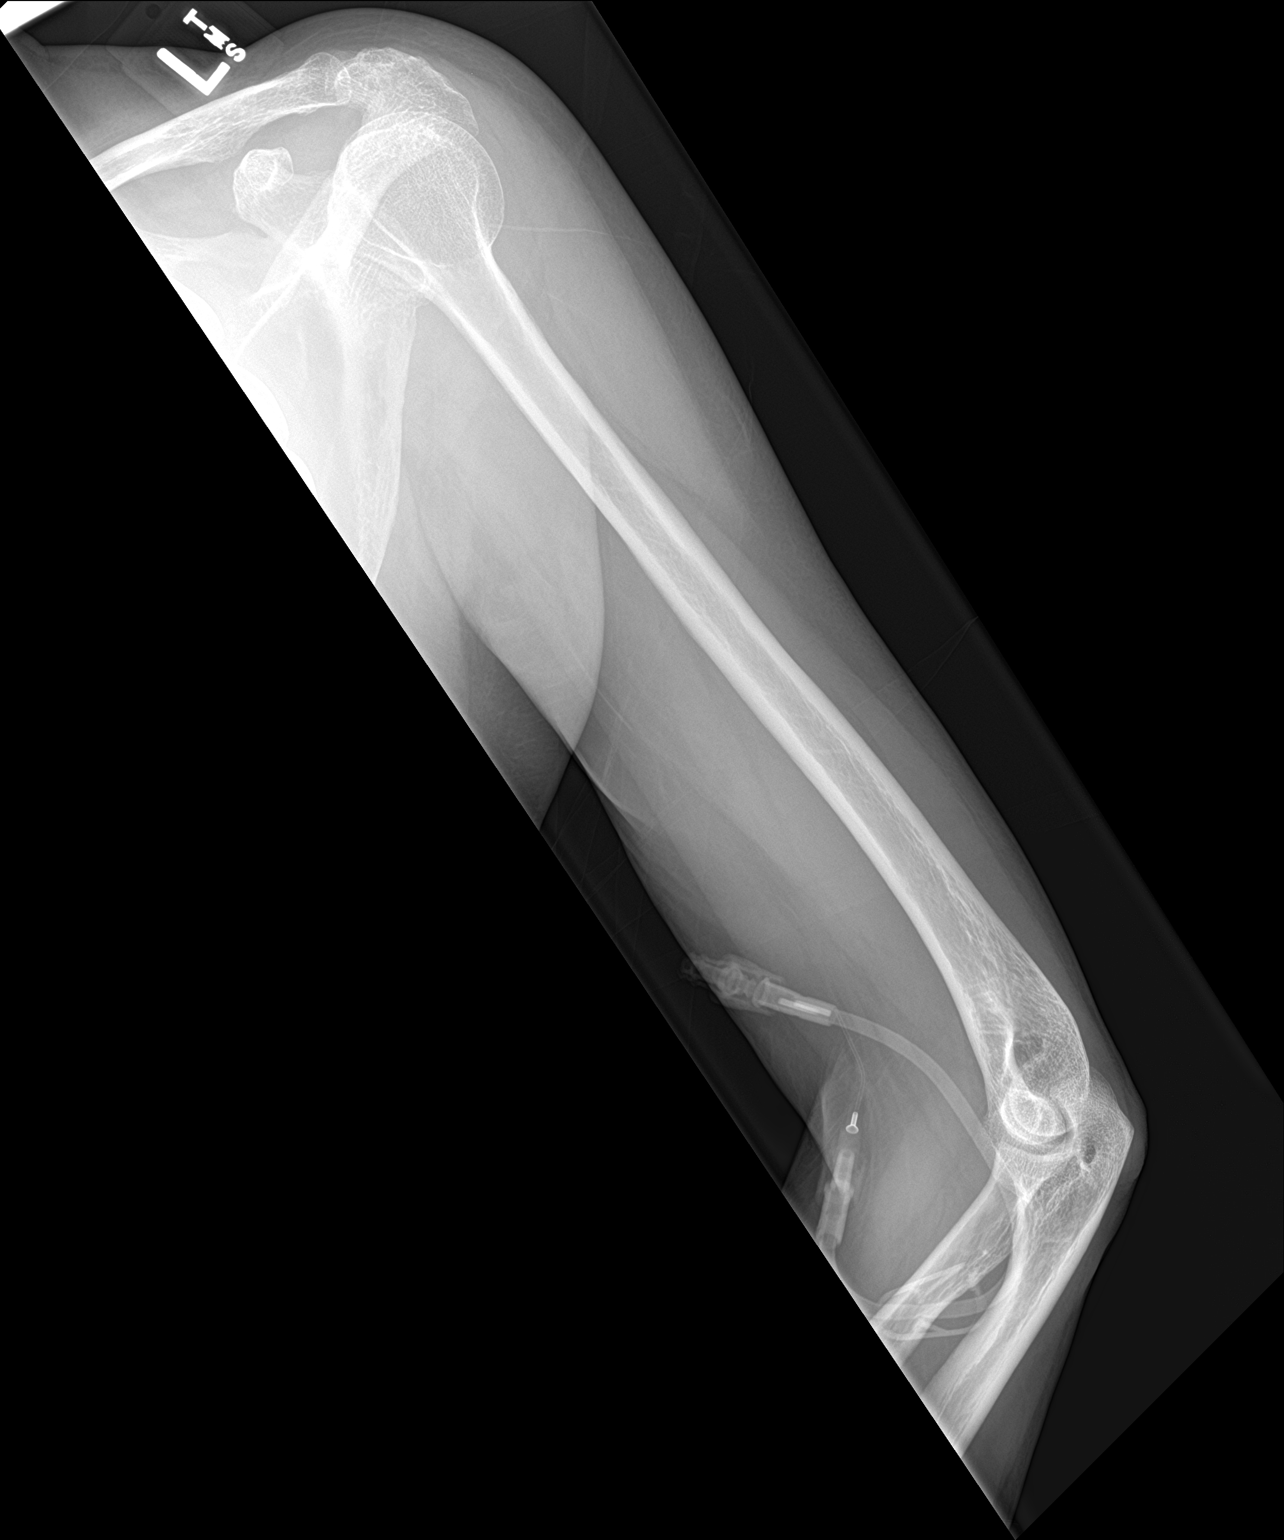

[2 of 2 positions shown; findings below may reference images not displayed]

FINDINGS: There is no evidence of fracture or other focal bone lesions. Soft
tissues are unremarkable.
IMPRESSION: Negative.

## 2020-03-11 IMAGING — CT CT CERVICAL SPINE W/O CM
3 of 4 series · 12 of 33 positions shown, 14 images · non-contrast
Comparison: None

CLINICAL DATA: Trauma to the head and neck. Abnormal mental status.

EXAM:
CT HEAD WITHOUT CONTRAST
CT CERVICAL SPINE WITHOUT CONTRAST
TECHNIQUE: Multidetector CT imaging of the head and cervical spine was
performed following the standard protocol without intravenous
contrast. Multiplanar CT image reconstructions of the cervical spine
were also generated.

[Series 5: c_spine 2.0 st · axial · 0.28mm/px · z∈[-256,-144]mm · 4 of 94 slices shown, 5 images]
[im 19/94  soft-tissue]
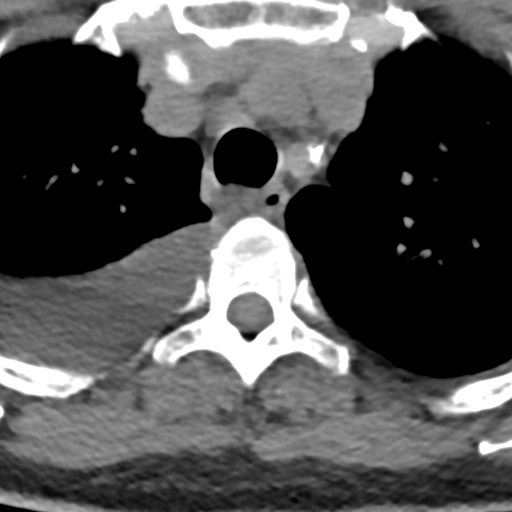
[im 19/94  bone]
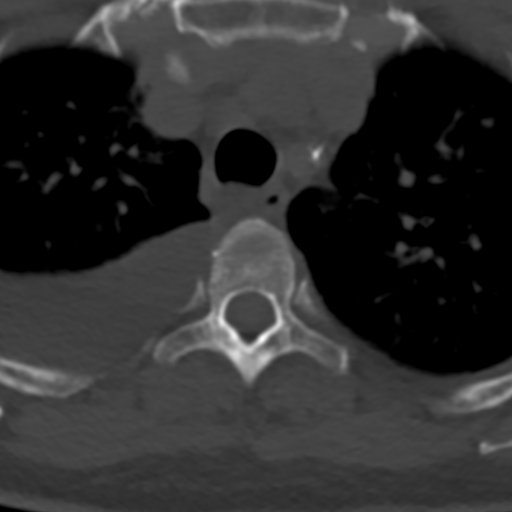
[im 38/94  bone]
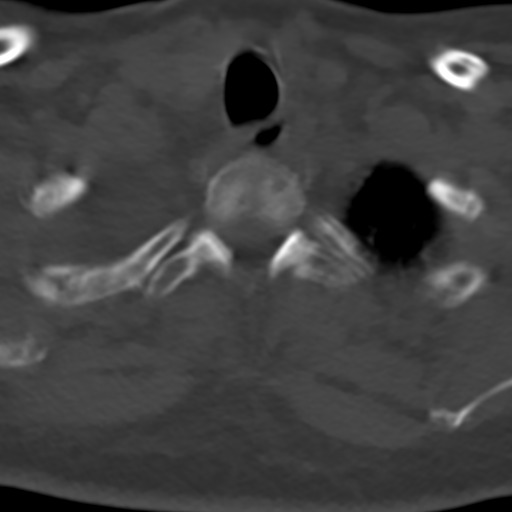
[im 56/94  bone]
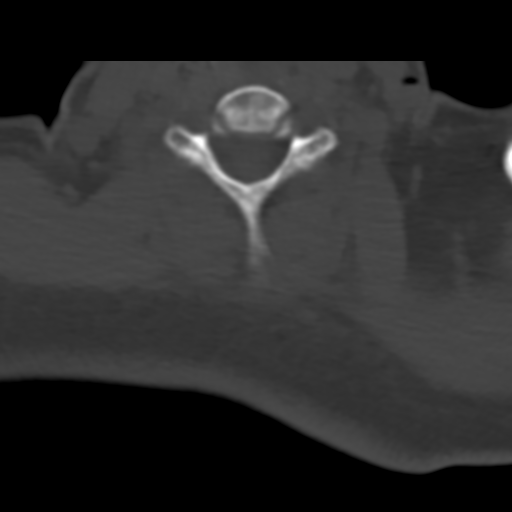
[im 75/94  bone]
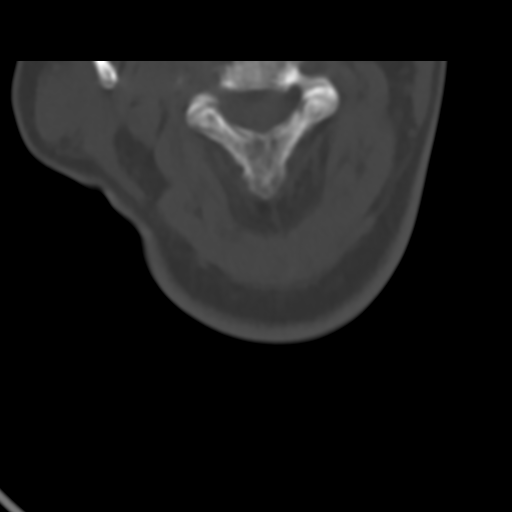

[Series 8: coronal bone · coronal · 0.23mm/px · 3 of 61 slices shown]
[im 13/61  bone]
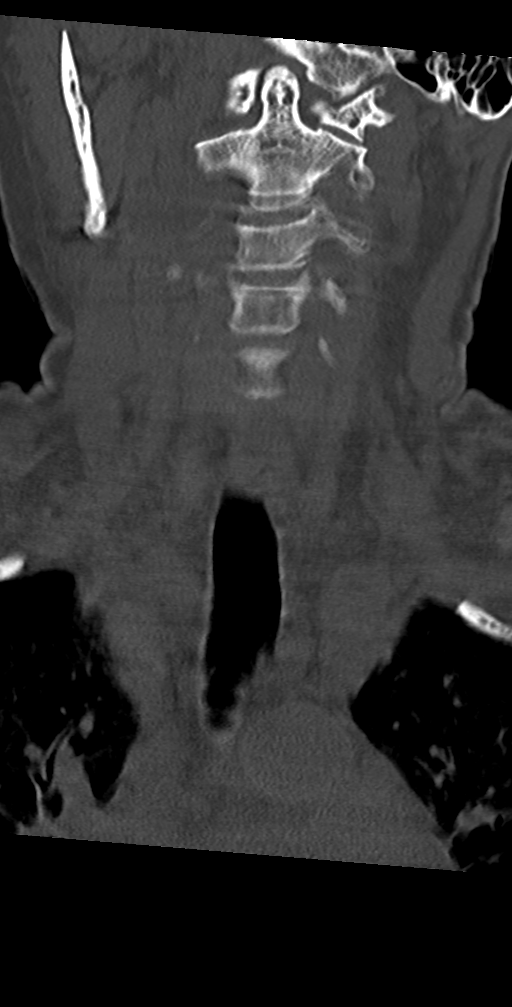
[im 25/61  bone]
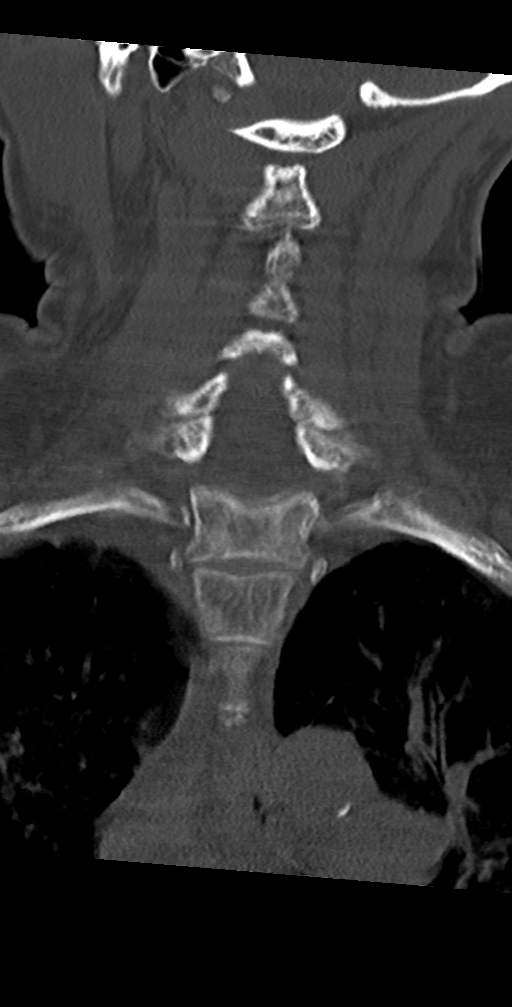
[im 37/61  bone]
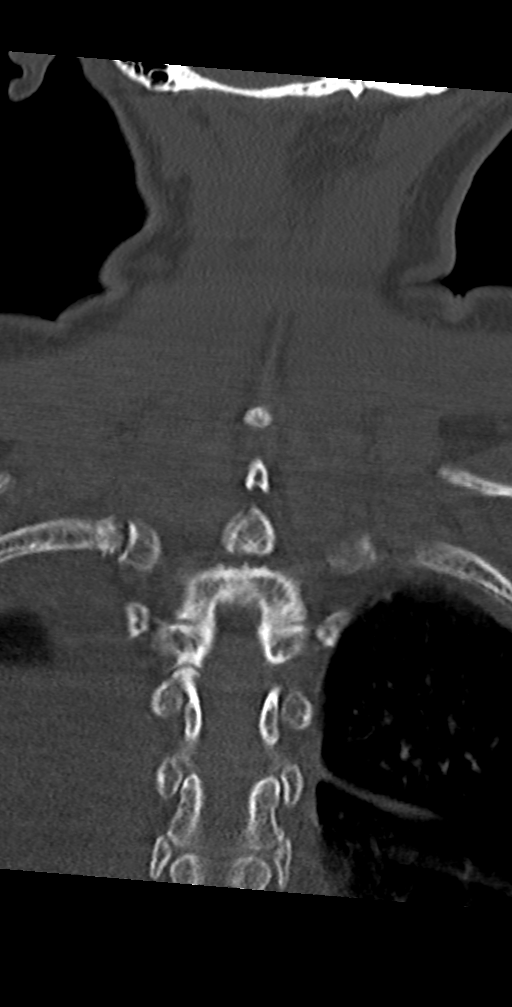

[Series 9: sagittal bone · sagittal · 0.37mm/px · 5 of 61 slices shown, 6 images]
[im 21/61  bone]
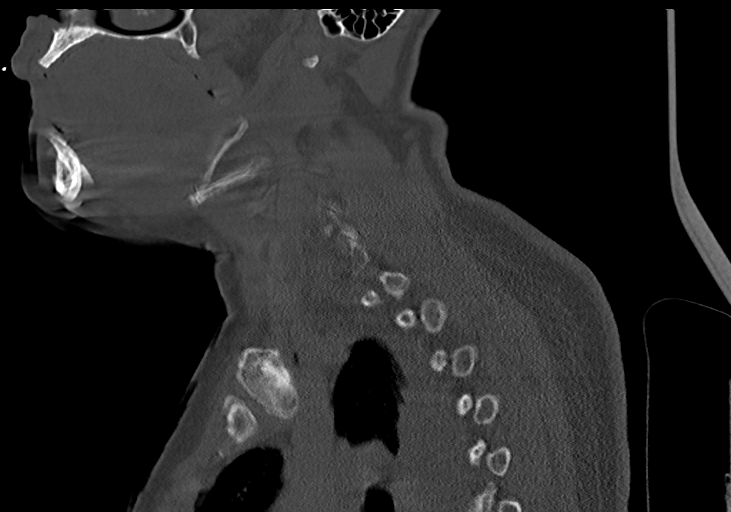
[im 26/61  bone]
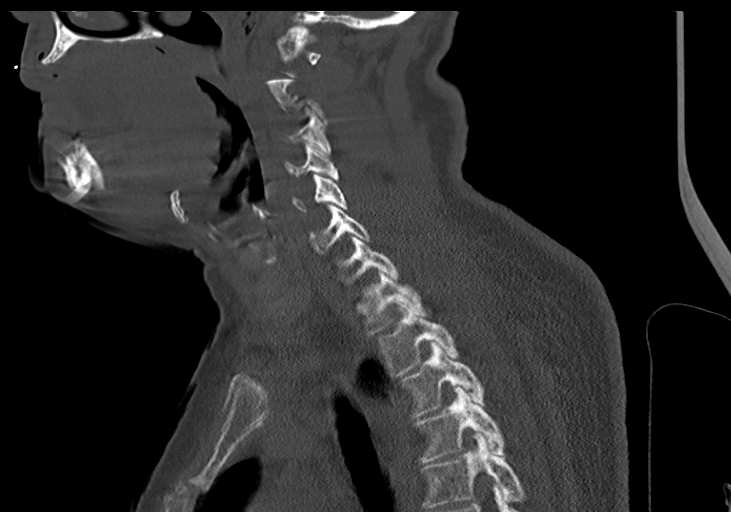
[im 31/61  soft-tissue]
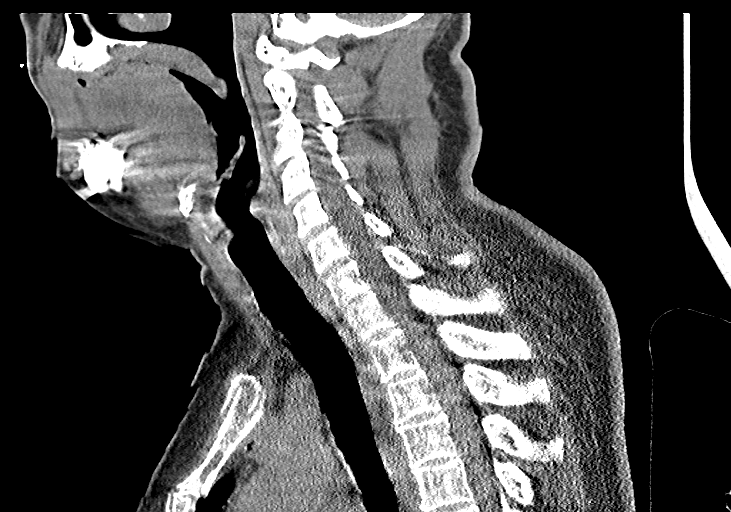
[im 31/61  bone]
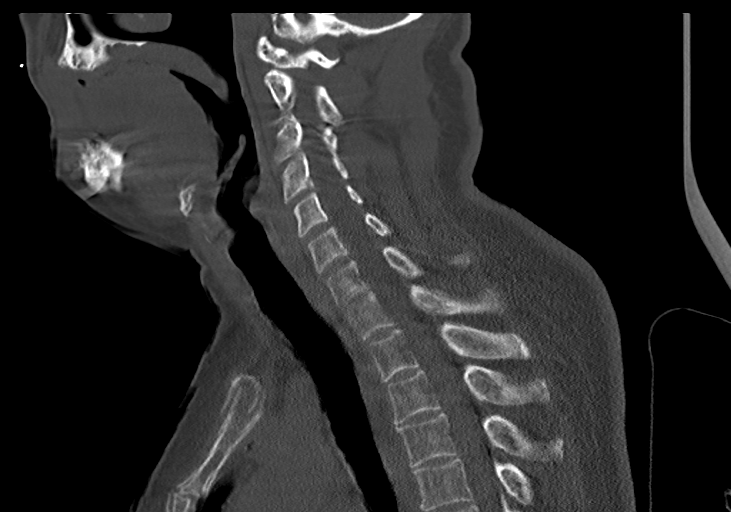
[im 36/61  bone]
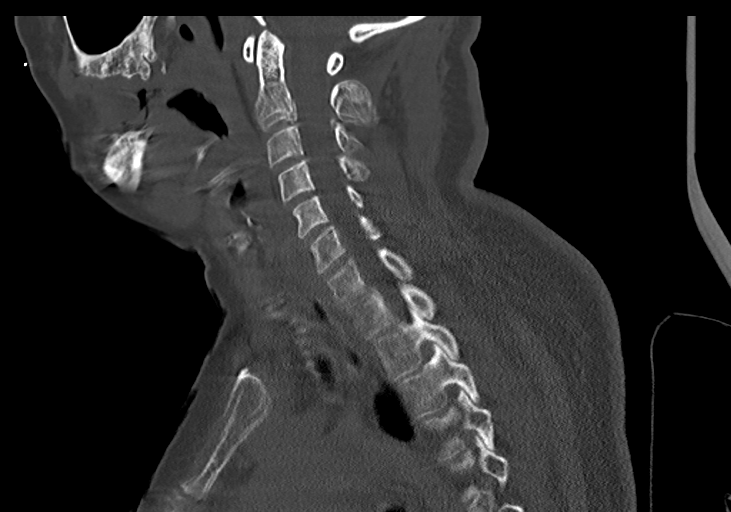
[im 41/61  bone]
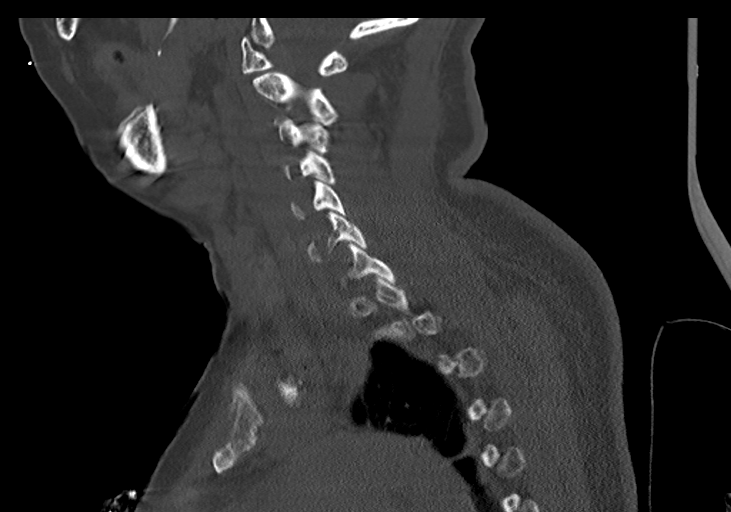

[12 of 33 positions shown; findings below may reference images not displayed]

FINDINGS: CT HEAD FINDINGS

Brain: Cerebellar atrophy with a few old small vessel cerebellar
infarctions. Low-density in the right thalamus that could indicate
acute or subacute right thalamic infarction. No hemorrhage. This
extends back into right mid brain. Cerebral hemispheres elsewhere
show chronic small-vessel ischemic change of the white matter. Old
left parietal cortical and subcortical infarction. No sign of mass
lesion, hemorrhage, hydrocephalus or extra-axial collection.

Vascular: There is atherosclerotic calcification of the major
vessels at the base of the brain.

Skull: Negative

Sinuses/Orbits: Clear/normal

Other: None

CT CERVICAL SPINE FINDINGS

Alignment: Normal

Skull base and vertebrae: No fracture or primary bone lesion. Some
motion degradation in the upper cervical region.

Soft tissues and spinal canal: No sign of soft tissue injury.

Disc levels: No significant degenerative disc disease is evident.
Ordinary facet osteoarthritis at C7-T1.

Upper chest: Mild pleural and parenchymal scarring at the apices.

Other: None
IMPRESSION: HEAD CT:

Low-density in the right thalamus of indeterminate age but that
could indicate acute or subacute right thalamic infarction. No
hemorrhage or mass effect. Old left parietal cortical and
subcortical infarction. Chronic small-vessel ischemic changes
elsewhere.

CERVICAL SPINE CT:

No acute or traumatic finding. Ordinary facet osteoarthritis at
C7-T1. Some motion degradation.

## 2020-03-11 IMAGING — US US ABDOMEN LIMITED
1 series · 13 of 25 positions shown · non-contrast
Comparison: [DATE]

CLINICAL DATA: Elevated bilirubin

EXAM:
ULTRASOUND ABDOMEN LIMITED RIGHT UPPER QUADRANT

[Series 1: us abdomen limited ruq (liver/gb) · 13 of 46 slices shown]
[im 1/46]
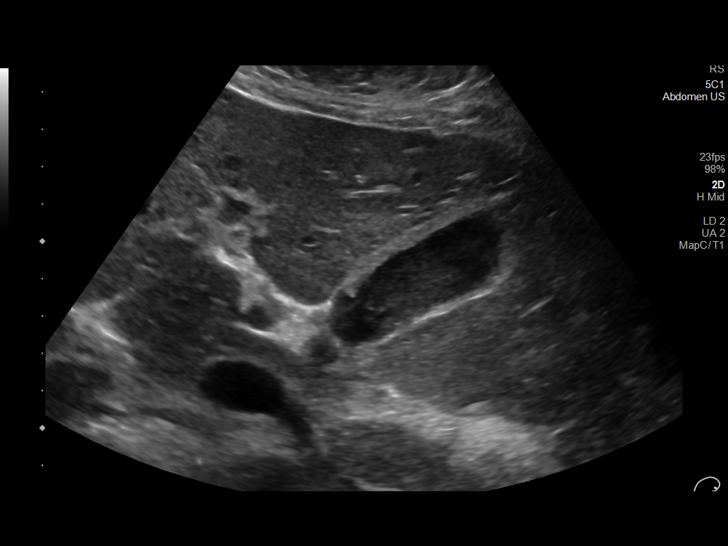
[im 4/46]
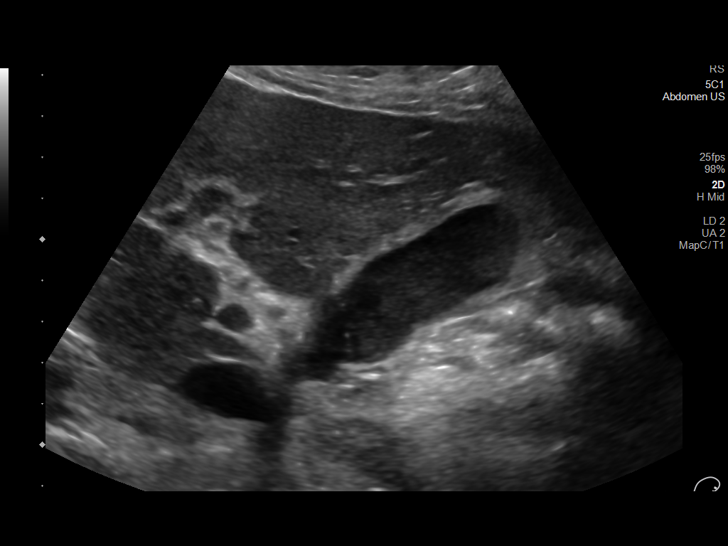
[im 8/46]
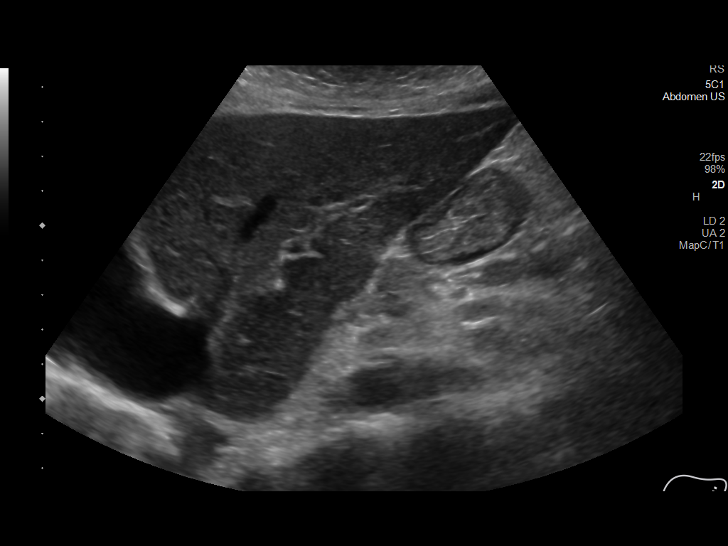
[im 12/46]
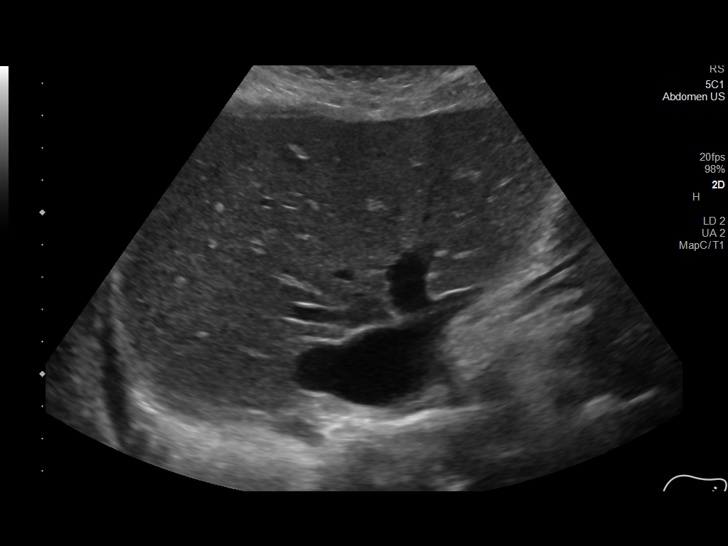
[im 16/46]
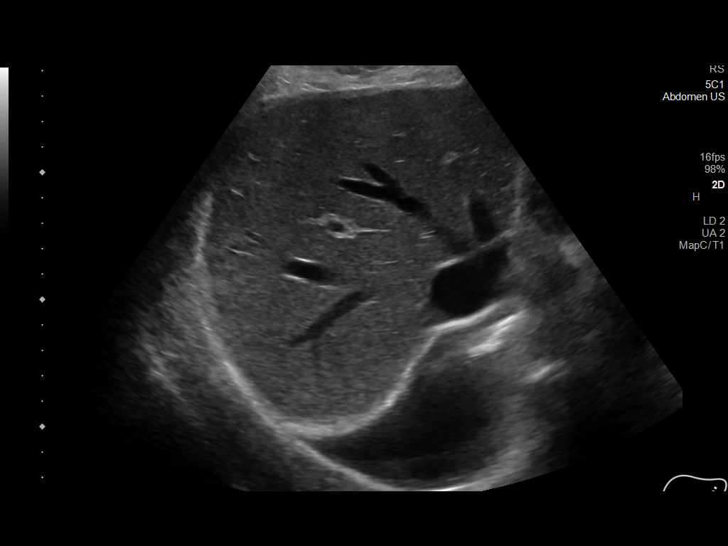
[im 19/46]
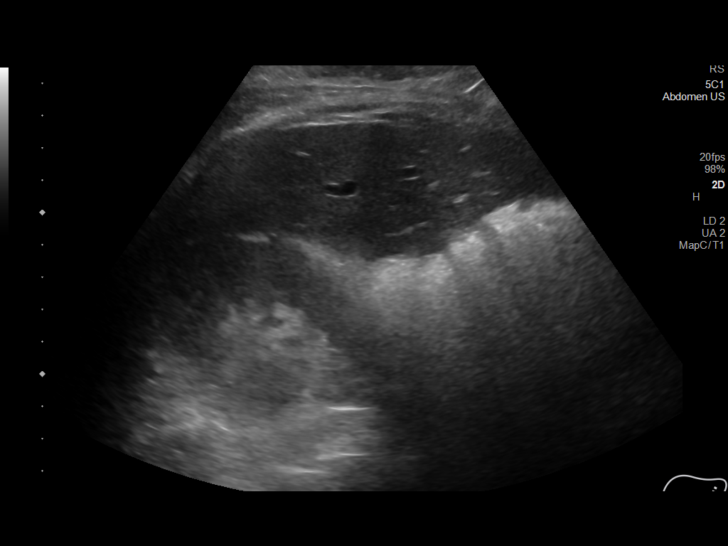
[im 23/46]
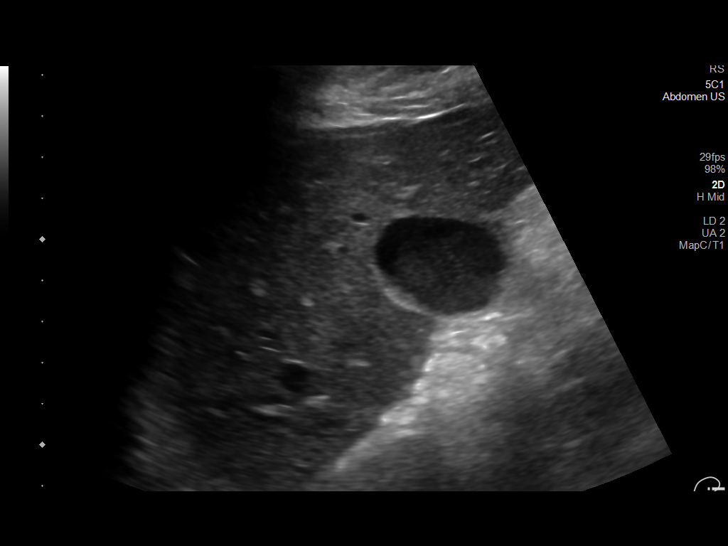
[im 27/46]
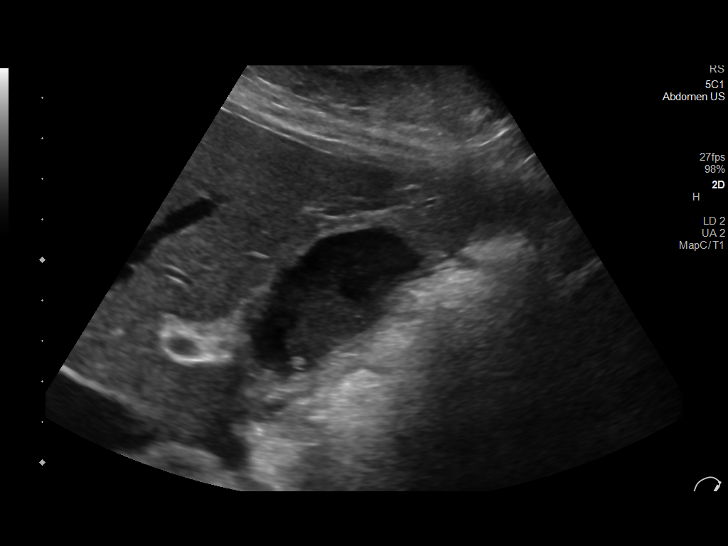
[im 31/46]
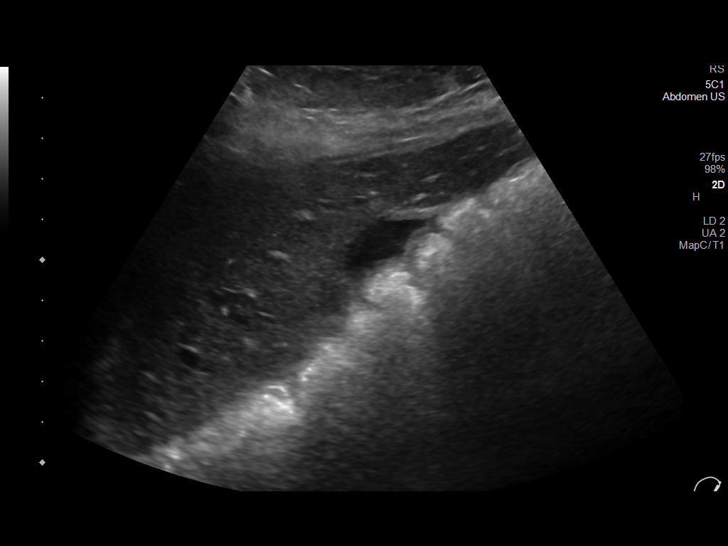
[im 34/46]
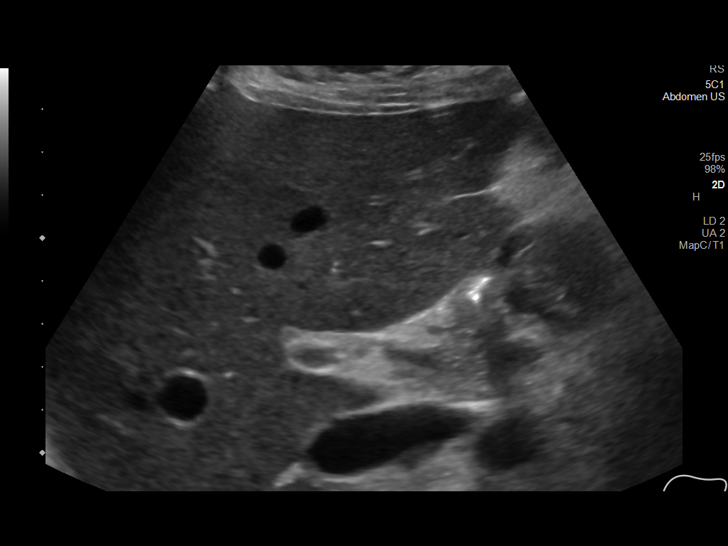
[im 38/46]
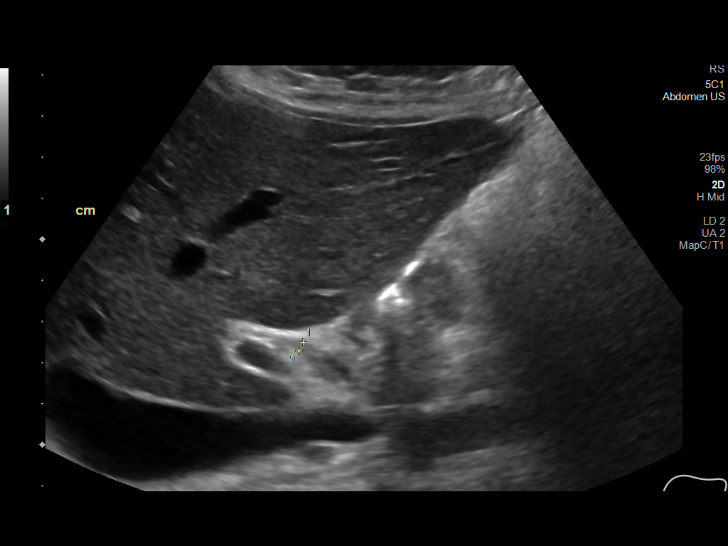
[im 42/46]
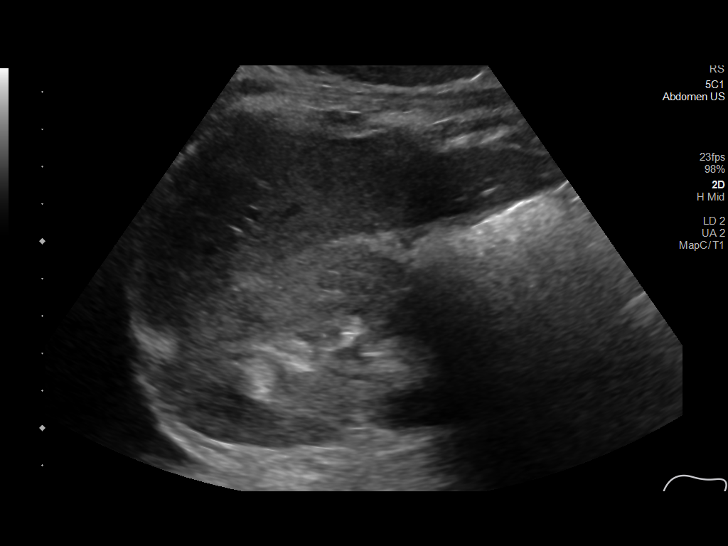
[im 46/46]
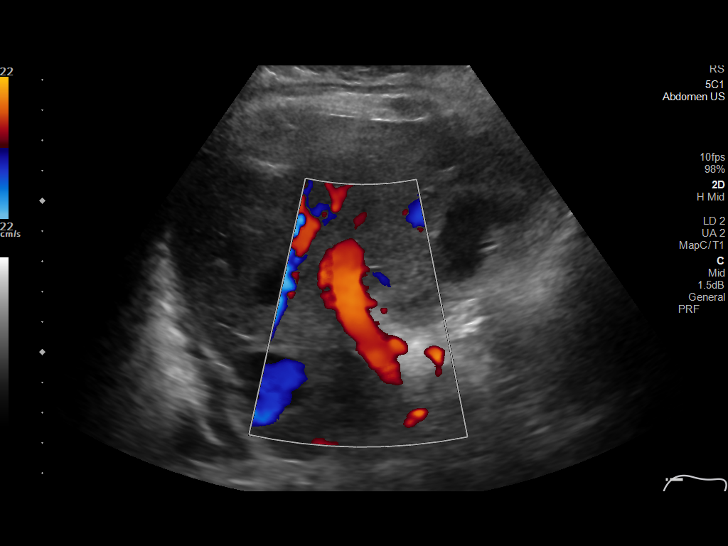

[13 of 25 positions shown; findings below may reference images not displayed]

FINDINGS: Gallbladder:

Signs of cholelithiasis. Gallbladder wall thickening to 4-5 mm on
today's study approximately 3-4 mm on the previous exam. No reported
sonographic tenderness over the gallbladder. Cholelithiasis as on
the previous exam.

Common bile duct:

Diameter: 2.3 mm

Liver:

No focal lesion in the liver on submitted images. Echotexture
grossly normal. Portal vein is patent on color Doppler imaging with
normal direction of blood flow towards the liver.

Other: Echogenic RIGHT kidney and at least moderate RIGHT-sided
pleural effusion.
IMPRESSION: Findings remain equivocal for acute cholecystitis with gallbladder
wall thickening and gallstones. Lack of reported tenderness over the
gallbladder. Findings could be seen in generalized edematous state.
Correlate with signs of heart failure in this patient with
RIGHT-sided effusion.

Echogenic RIGHT kidney could be seen in the setting of renal
dysfunction/medical renal disease incidentally imaged and
incompletely assessed on the current exam.

IVC remains prominent as do hepatic veins raising the question
congestive hepatopathy.

## 2020-03-11 IMAGING — MR MR HEAD W/O CM
6 series · 46 of 48 positions shown · non-contrast
Comparison: None.

CLINICAL DATA: Mental status change

EXAM:
MRI HEAD WITHOUT CONTRAST
TECHNIQUE: Multiplanar, multiecho pulse sequences of the brain and surrounding
structures were obtained without intravenous contrast.

[Series 3: DWI · axial · 3.0mm · 1.09mm/px · z∈[-20,+106]mm · 15 of 86 slices shown (1 of 4)]
[im 1/86]
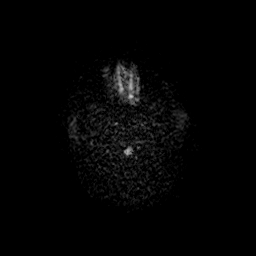
[im 7/86]
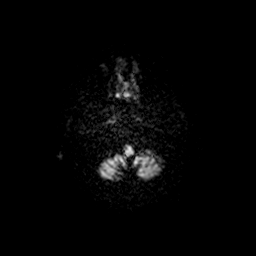
[im 13/86]
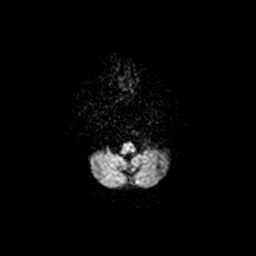
[im 19/86]
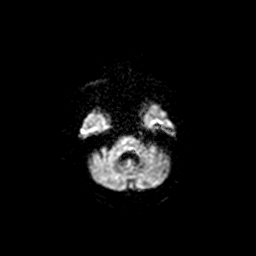
[im 25/86]
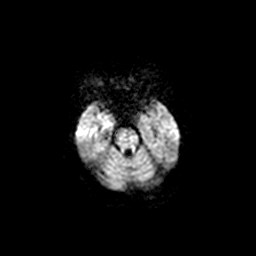
[im 31/86]
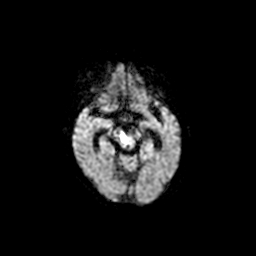
[im 37/86]
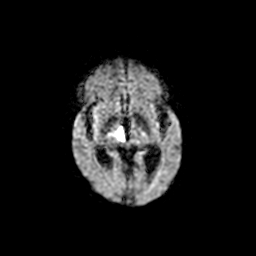
[im 43/86]
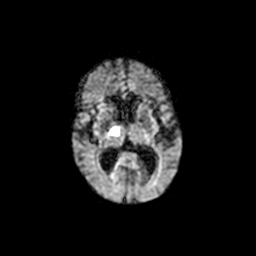
[im 49/86]
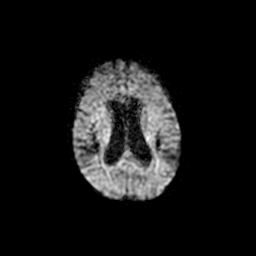
[im 55/86]
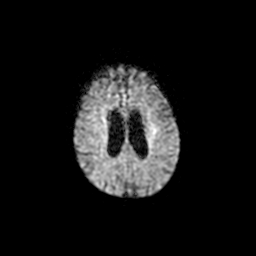
[im 61/86]
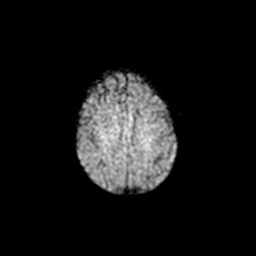
[im 67/86]
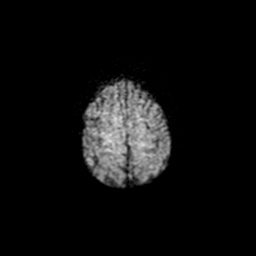
[im 73/86]
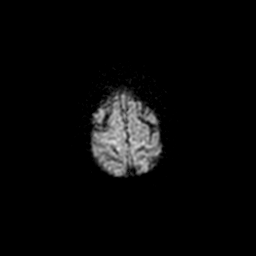
[im 79/86]
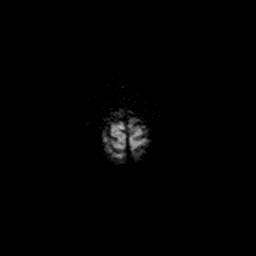
[im 86/86]
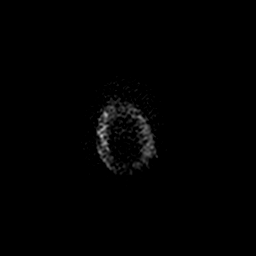

[Series 5: DWI · coronal · 5.0mm · 1.09mm/px · 10 of 70 slices shown (2 of 4)]
[im 1/70]
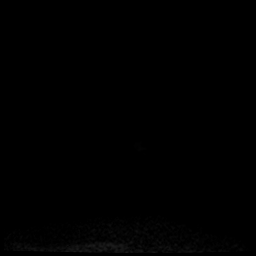
[im 7/70]
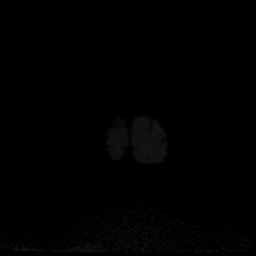
[im 13/70]
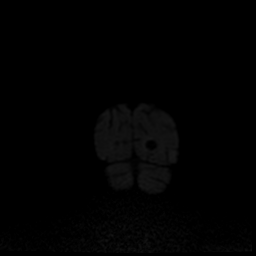
[im 19/70]
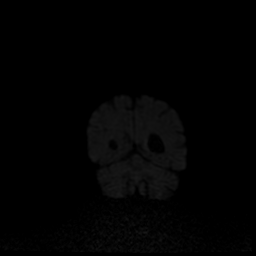
[im 32/70]
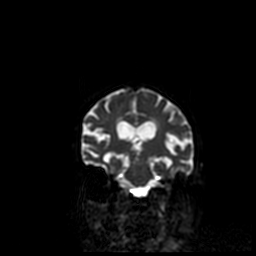
[im 38/70]
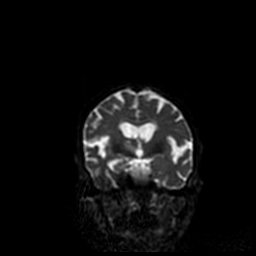
[im 51/70]
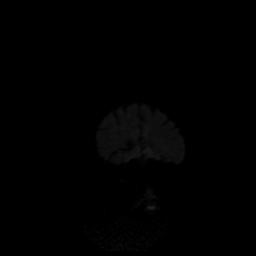
[im 57/70]
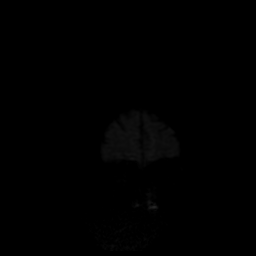
[im 63/70]
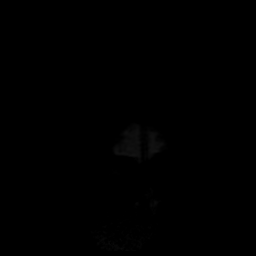
[im 70/70]
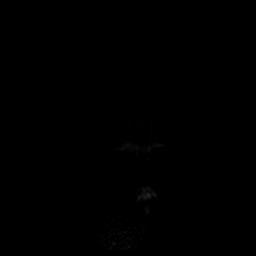

[Series 6: T2 · axial · 5.0mm · 0.43mm/px · z∈[-22,+104]mm · 4 of 22 slices shown (1 of 2)]
[im 1/22]
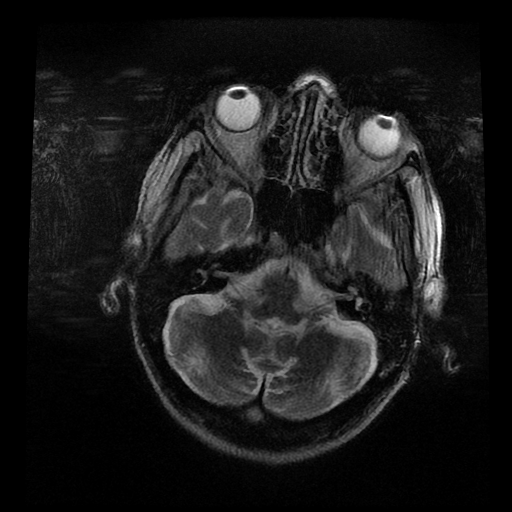
[im 8/22]
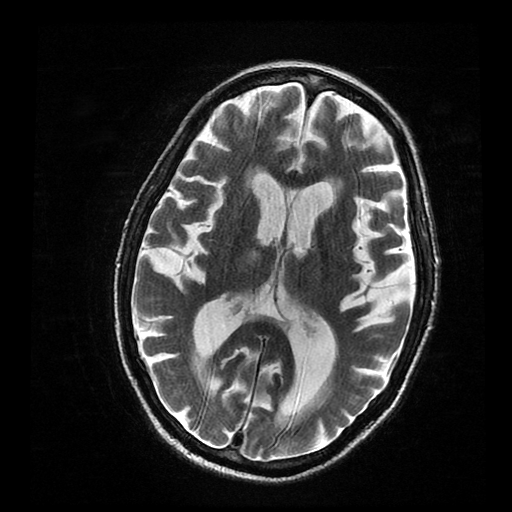
[im 15/22]
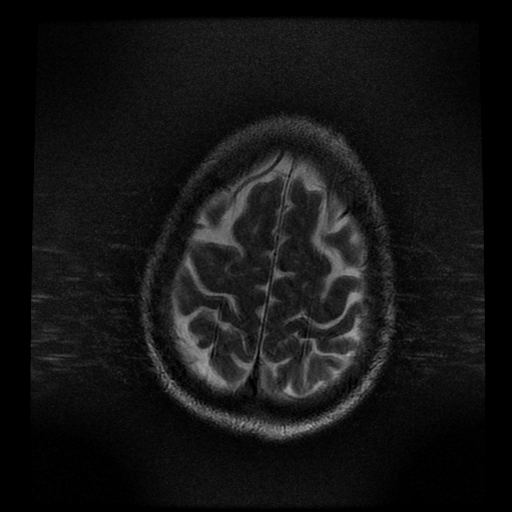
[im 22/22]
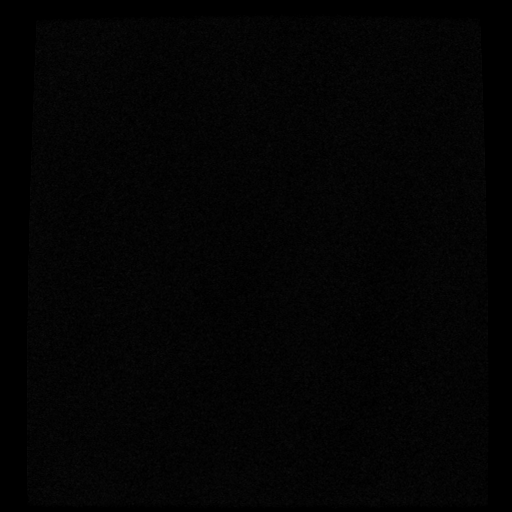

[Series 7: T2 · axial · 5.0mm · 0.43mm/px · z∈[-22,+104]mm · 4 of 22 slices shown (2 of 2)]
[im 1/22]
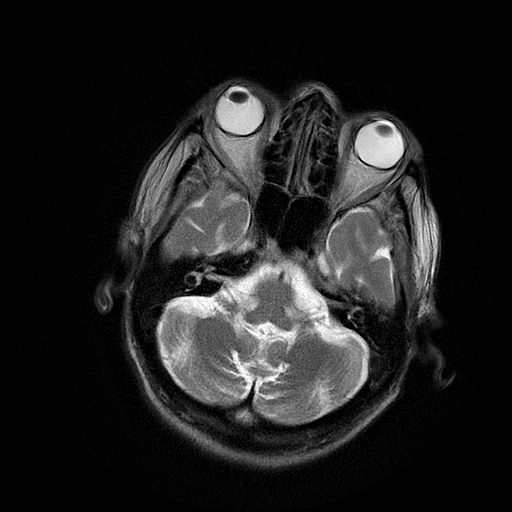
[im 8/22]
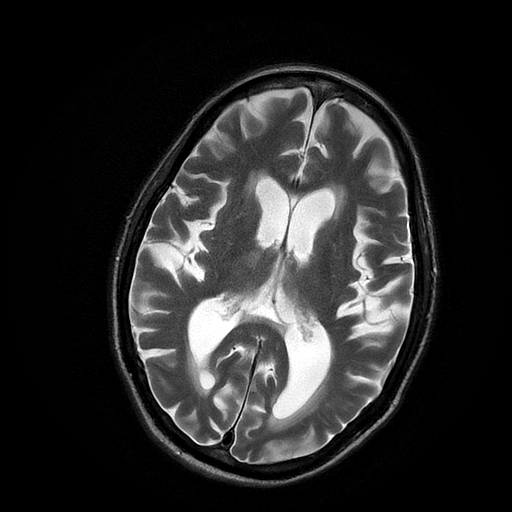
[im 15/22]
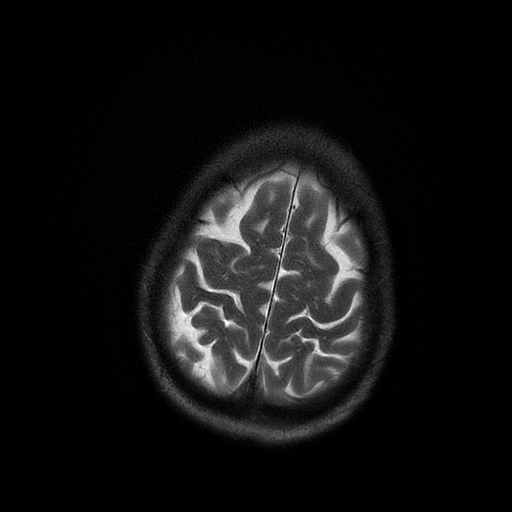
[im 22/22]
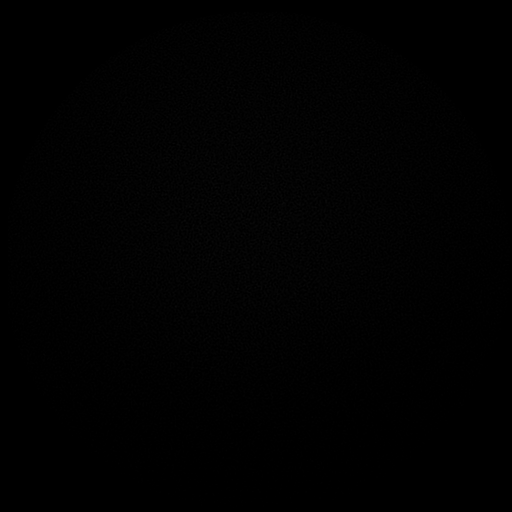

[Series 300: DWI · axial · 3.0mm · 1.09mm/px · z∈[-20,+106]mm · 7 of 43 slices shown (3 of 4)]
[im 1/43]
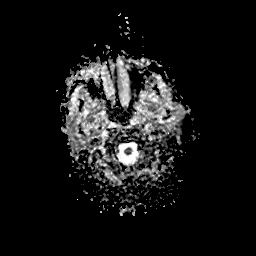
[im 8/43]
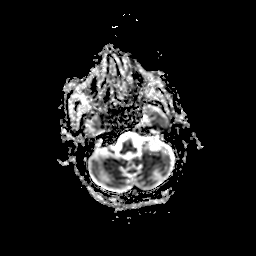
[im 15/43]
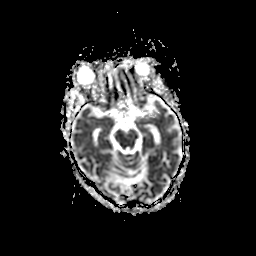
[im 22/43]
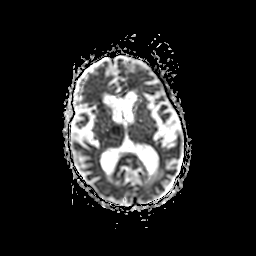
[im 29/43]
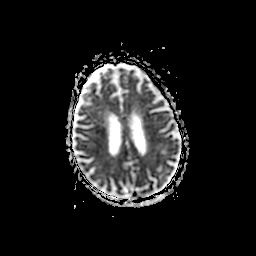
[im 36/43]
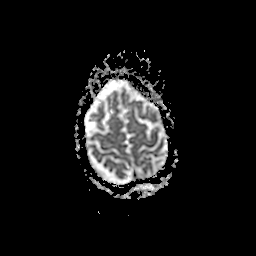
[im 43/43]
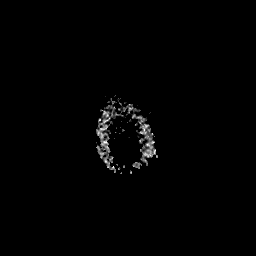

[Series 500: DWI · coronal · 5.0mm · 1.09mm/px · 6 of 35 slices shown (4 of 4)]
[im 1/35]
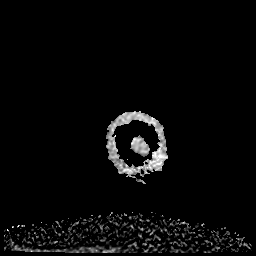
[im 7/35]
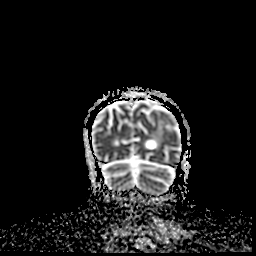
[im 14/35]
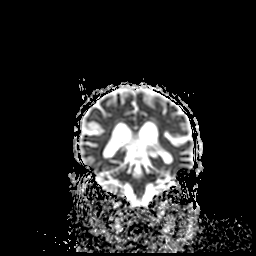
[im 21/35]
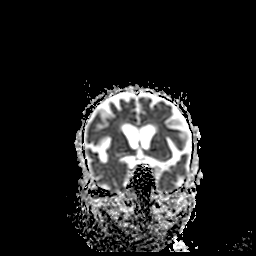
[im 28/35]
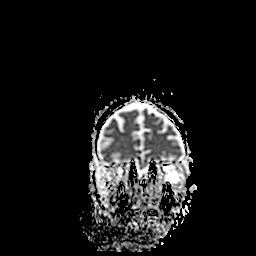
[im 35/35]
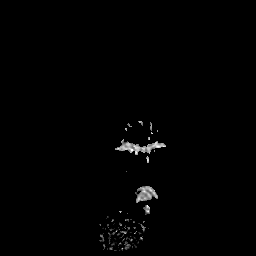

[46 of 48 positions shown; findings below may reference images not displayed]

FINDINGS: DWI and axial T2 sequences were attempted. Motion degradation is
present. Patient could not tolerate remainder of the study.

There is reduced diffusion in the anterior right thalamus extending
into the parasagittal right midbrain with corresponding T2
hyperintensity.

Prominence of the ventricles and sulci reflects generalized
parenchymal volume loss. There is no significant mass effect.
Chronic left parietal infarct. Probable chronic microvascular
ischemic changes.
IMPRESSION: Partial study with motion degradation.

Acute infarction of the anterior right thalamus extending into the
parasagittal right midbrain.

## 2020-03-11 MED ORDER — LACTATED RINGERS IV SOLN: INTRAVENOUS | Status: AC

## 2020-03-11 MED ORDER — INSULIN GLARGINE 100 UNIT/ML ~~LOC~~ SOLN
10.0000 [IU] | Freq: Every day | SUBCUTANEOUS | Status: DC
  Administered 2020-03-11 – 2020-03-13 (×3): 10 [IU] via SUBCUTANEOUS
  Filled 2020-03-11 (×6): qty 0.1

## 2020-03-11 MED ORDER — ONDANSETRON HCL 4 MG PO TABS
4.0000 mg | ORAL_TABLET | Freq: Four times a day (QID) | ORAL | Status: DC | PRN
  Administered 2020-03-19 – 2020-03-22 (×2): 4 mg via ORAL
  Filled 2020-03-11 (×2): qty 1

## 2020-03-11 MED ORDER — INSULIN ASPART 100 UNIT/ML ~~LOC~~ SOLN
0.0000 [IU] | Freq: Three times a day (TID) | SUBCUTANEOUS | Status: DC
  Administered 2020-03-12: 5 [IU] via SUBCUTANEOUS
  Administered 2020-03-12: 3 [IU] via SUBCUTANEOUS
  Administered 2020-03-13: 2 [IU] via SUBCUTANEOUS
  Administered 2020-03-15: 8 [IU] via SUBCUTANEOUS

## 2020-03-11 MED ORDER — METRONIDAZOLE IN NACL 5-0.79 MG/ML-% IV SOLN
500.0000 mg | Freq: Once | INTRAVENOUS | Status: AC
  Administered 2020-03-11: 500 mg via INTRAVENOUS
  Filled 2020-03-11: qty 100

## 2020-03-11 MED ORDER — SODIUM CHLORIDE 0.9 % IV BOLUS
500.0000 mL | Freq: Once | INTRAVENOUS | Status: AC
  Administered 2020-03-11: 500 mL via INTRAVENOUS

## 2020-03-11 MED ORDER — ACETAMINOPHEN 650 MG RE SUPP: 650.0000 mg | Freq: Four times a day (QID) | RECTAL | Status: DC | PRN

## 2020-03-11 MED ORDER — STROKE: EARLY STAGES OF RECOVERY BOOK
Freq: Once | Status: AC
  Filled 2020-03-11: qty 1

## 2020-03-11 MED ORDER — ONDANSETRON HCL 4 MG/2ML IJ SOLN
4.0000 mg | Freq: Four times a day (QID) | INTRAMUSCULAR | Status: DC | PRN
  Administered 2020-03-25: 4 mg via INTRAVENOUS
  Filled 2020-03-11: qty 2

## 2020-03-11 MED ORDER — SODIUM CHLORIDE 0.9 % IV SOLN
1.0000 g | Freq: Once | INTRAVENOUS | Status: AC
  Administered 2020-03-11: 1 g via INTRAVENOUS
  Filled 2020-03-11: qty 10

## 2020-03-11 MED ORDER — ENOXAPARIN SODIUM 40 MG/0.4ML ~~LOC~~ SOLN
40.0000 mg | SUBCUTANEOUS | Status: DC
  Administered 2020-03-11: 40 mg via SUBCUTANEOUS
  Filled 2020-03-11: qty 0.4

## 2020-03-11 MED ORDER — SODIUM CHLORIDE 0.9 % IV BOLUS
1000.0000 mL | Freq: Once | INTRAVENOUS | Status: AC
  Administered 2020-03-11: 1000 mL via INTRAVENOUS

## 2020-03-11 MED ORDER — ACETAMINOPHEN 325 MG PO TABS
650.0000 mg | ORAL_TABLET | Freq: Four times a day (QID) | ORAL | Status: DC | PRN
  Administered 2020-03-12 – 2020-03-25 (×20): 650 mg via ORAL
  Filled 2020-03-11 (×20): qty 2

## 2020-03-11 MED ORDER — POLYETHYLENE GLYCOL 3350 17 G PO PACK: 17.0000 g | PACK | Freq: Every day | ORAL | Status: DC | PRN

## 2020-03-11 MED ORDER — ASPIRIN 300 MG RE SUPP: 300.0000 mg | Freq: Every day | RECTAL | Status: DC

## 2020-03-11 MED ORDER — SODIUM CHLORIDE 0.9 % IV SOLN
500.0000 mg | Freq: Once | INTRAVENOUS | Status: AC
  Administered 2020-03-11: 500 mg via INTRAVENOUS
  Filled 2020-03-11: qty 500

## 2020-03-11 MED ORDER — ASPIRIN 325 MG PO TABS
325.0000 mg | ORAL_TABLET | Freq: Every day | ORAL | Status: DC
  Administered 2020-03-11: 325 mg via ORAL
  Filled 2020-03-11: qty 1

## 2020-03-11 NOTE — ED Provider Notes (Signed)
Patient signed out to me by L. Eulah Pont, PA-C.  Please see previous notes for further history.  In brief, patient presenting for evaluation of altered mental status.  Last seen well on Sunday, 2 days ago.  She not show up to work today, and will check found patient down in her home.  She states she fell but was too weak to get up.  Work-up so far has seen an abnormality on CT concerning for an acute or subacute thalamic stroke stroke.  Additionally, patient with elevated lactic, not a clear source although chest x-ray does show some signs of possible infiltrate.  Antibiotics ordered.  Patient with a history of heart failure, stable blood pressure, as such, not given 30 cc per kg.  Of note, patient is hyperglycemic, but does not appear in DKA and that her pH is 7.4 and bicarb is normal.  Pending chemistry prior to admission.  CMP shows elevated bili of 3.4.  All this may be due to dehydration, in the setting of unknown source for infection will also obtain ultrasound to look for cholecystitis.  MRI ordered for further evaluation of possible stroke.  Will consult neurology.  Discussed with Dr Iver Nestle from neurology, who recommends MRI.  If positive, they will consult.  Discussed with internal medicine teaching service, patient to be admitted.  Results for orders placed or performed during the hospital encounter of 03/11/20  Respiratory Panel by RT PCR (Flu A&B, Covid) - Nasopharyngeal Swab   Specimen: Nasopharyngeal Swab  Result Value Ref Range   SARS Coronavirus 2 by RT PCR NEGATIVE NEGATIVE   Influenza A by PCR NEGATIVE NEGATIVE   Influenza B by PCR NEGATIVE NEGATIVE  CBC with Differential  Result Value Ref Range   WBC 7.2 4.0 - 10.5 K/uL   RBC 5.22 (H) 3.87 - 5.11 MIL/uL   Hemoglobin 16.1 (H) 12.0 - 15.0 g/dL   HCT 49.6 (H) 36 - 46 %   MCV 98.1 80.0 - 100.0 fL   MCH 30.8 26.0 - 34.0 pg   MCHC 31.4 30.0 - 36.0 g/dL   RDW 75.9 (H) 16.3 - 84.6 %   Platelets 251 150 - 400 K/uL   nRBC 0.0 0.0 -  0.2 %   Neutrophils Relative % 79 %   Neutro Abs 5.6 1.7 - 7.7 K/uL   Lymphocytes Relative 14 %   Lymphs Abs 1.0 0.7 - 4.0 K/uL   Monocytes Relative 7 %   Monocytes Absolute 0.5 0.1 - 1.0 K/uL   Eosinophils Relative 0 %   Eosinophils Absolute 0.0 0.0 - 0.5 K/uL   Basophils Relative 0 %   Basophils Absolute 0.0 0.0 - 0.1 K/uL   Immature Granulocytes 0 %   Abs Immature Granulocytes 0.03 0.00 - 0.07 K/uL  Urinalysis, Routine w reflex microscopic Urine, Clean Catch  Result Value Ref Range   Color, Urine YELLOW YELLOW   APPearance HAZY (A) CLEAR   Specific Gravity, Urine 1.010 1.005 - 1.030   pH 7.0 5.0 - 8.0   Glucose, UA >=500 (A) NEGATIVE mg/dL   Hgb urine dipstick NEGATIVE NEGATIVE   Bilirubin Urine NEGATIVE NEGATIVE   Ketones, ur 15 (A) NEGATIVE mg/dL   Protein, ur NEGATIVE NEGATIVE mg/dL   Nitrite NEGATIVE NEGATIVE   Leukocytes,Ua NEGATIVE NEGATIVE  Lactic acid, plasma  Result Value Ref Range   Lactic Acid, Venous 3.7 (HH) 0.5 - 1.9 mmol/L  Ammonia  Result Value Ref Range   Ammonia 18 9 - 35 umol/L  Protime-INR  Result  Value Ref Range   Prothrombin Time 14.3 11.4 - 15.2 seconds   INR 1.2 0.8 - 1.2  Lactic acid, plasma  Result Value Ref Range   Lactic Acid, Venous 3.8 (HH) 0.5 - 1.9 mmol/L  Ethanol  Result Value Ref Range   Alcohol, Ethyl (B) <10 <10 mg/dL  Urinalysis, Microscopic (reflex)  Result Value Ref Range   RBC / HPF NONE SEEN 0 - 5 RBC/hpf   WBC, UA 0-5 0 - 5 WBC/hpf   Bacteria, UA FEW (A) NONE SEEN   Squamous Epithelial / LPF 21-50 0 - 5  CK  Result Value Ref Range   Total CK 243 (H) 38.0 - 234.0 U/L  Comprehensive metabolic panel  Result Value Ref Range   Sodium 138 135 - 145 mmol/L   Potassium 3.4 (L) 3.5 - 5.1 mmol/L   Chloride 95 (L) 98 - 111 mmol/L   CO2 23 22 - 32 mmol/L   Glucose, Bld 322 (H) 70 - 99 mg/dL   BUN 21 (H) 6 - 20 mg/dL   Creatinine, Ser 9.98 (H) 0.44 - 1.00 mg/dL   Calcium 8.4 (L) 8.9 - 10.3 mg/dL   Total Protein 6.8 6.5 - 8.1  g/dL   Albumin 3.1 (L) 3.5 - 5.0 g/dL   AST 27 15 - 41 U/L   ALT 16 0 - 44 U/L   Alkaline Phosphatase 131 (H) 38 - 126 U/L   Total Bilirubin 3.4 (H) 0.3 - 1.2 mg/dL   GFR, Estimated 43 (L) >60 mL/min   Anion gap 20 (H) 5 - 15  CBG monitoring, ED  Result Value Ref Range   Glucose-Capillary 308 (H) 70 - 99 mg/dL  I-Stat venous blood gas, [facility] ED)  Result Value Ref Range   pH, Ven 7.444 (H) 7.25 - 7.43   pCO2, Ven 34.5 (L) 44 - 60 mmHg   pO2, Ven 49.0 (H) 32 - 45 mmHg   Bicarbonate 23.6 20.0 - 28.0 mmol/L   TCO2 25 22 - 32 mmol/L   O2 Saturation 86.0 %   Acid-Base Excess 0.0 0.0 - 2.0 mmol/L   Sodium 137 135 - 145 mmol/L   Potassium 3.9 3.5 - 5.1 mmol/L   Calcium, Ion 0.85 (LL) 1.15 - 1.40 mmol/L   HCT 52.0 (H) 36 - 46 %   Hemoglobin 17.7 (H) 12.0 - 15.0 g/dL   Sample type VENOUS    Comment NOTIFIED PHYSICIAN    DG Chest 1 View  Result Date: 03/11/2020 CLINICAL DATA:  Weakness. EXAM: CHEST  1 VIEW COMPARISON:  12/17/2019 chest radiograph and prior. FINDINGS: Grossly unchanged right mid to lower lung patchy opacities. New left basilar opacities. No pneumothorax. Small right pleural effusion. Cardiomegaly. No acute osseous abnormality. IMPRESSION: Bibasilar opacities, new on the left since prior exam. Differential includes infection, atelectasis and edema. Cardiomegaly. Electronically Signed   By: Stana Bunting M.D.   On: 03/11/2020 11:34   CT Head Wo Contrast  Result Date: 03/11/2020 CLINICAL DATA:  Trauma to the head and neck. Abnormal mental status. EXAM: CT HEAD WITHOUT CONTRAST CT CERVICAL SPINE WITHOUT CONTRAST TECHNIQUE: Multidetector CT imaging of the head and cervical spine was performed following the standard protocol without intravenous contrast. Multiplanar CT image reconstructions of the cervical spine were also generated. COMPARISON:  None FINDINGS: CT HEAD FINDINGS Brain: Cerebellar atrophy with a few old small vessel cerebellar infarctions. Low-density in the right  thalamus that could indicate acute or subacute right thalamic infarction. No hemorrhage. This extends back into  right mid brain. Cerebral hemispheres elsewhere show chronic small-vessel ischemic change of the white matter. 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 No sign of mass lesion, hemorrhage, hydrocephalus or extra-axial collection. Vascular: There is atherosclerotic calcification of the major vessels at the base of the brain. Skull: Negative Sinuses/Orbits: Clear/normal Other: None CT CERVICAL SPINE FINDINGS Alignment: Normal Skull base and vertebrae: No fracture or primary bone lesion. Some motion degradation in the upper cervical region. Soft tissues and spinal canal: No sign of soft tissue injury. Disc levels: No significant degenerative disc disease is evident. 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 Upper chest: Mild pleural and parenchymal scarring at the apices. Other: None IMPRESSION: HEAD CT: Low-density in the right thalamus of indeterminate age but that could indicate acute or subacute right thalamic infarction. No hemorrhage or mass effect. 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 Chronic small-vessel ischemic changes elsewhere. CERVICAL SPINE CT: No acute or traumatic finding. 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 Some motion degradation. Electronically Signed   By: Paulina Fusi M.D.   On: 03/11/2020 11:44   CT Cervical Spine Wo Contrast  Result Date: 03/11/2020 CLINICAL DATA:  Trauma to the head and neck. Abnormal mental status. EXAM: CT HEAD WITHOUT CONTRAST CT CERVICAL SPINE WITHOUT CONTRAST TECHNIQUE: Multidetector CT imaging of the head and cervical spine was performed following the standard protocol without intravenous contrast. Multiplanar CT image reconstructions of the cervical spine were also generated. COMPARISON:  None FINDINGS: CT HEAD FINDINGS Brain: Cerebellar atrophy with a few old small vessel cerebellar infarctions. Low-density in the right  thalamus that could indicate acute or subacute right thalamic infarction. No hemorrhage. This extends back into right mid brain. Cerebral hemispheres elsewhere show chronic small-vessel ischemic change of the white matter. 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 No sign of mass lesion, hemorrhage, hydrocephalus or extra-axial collection. Vascular: There is atherosclerotic calcification of the major vessels at the base of the brain. Skull: Negative Sinuses/Orbits: Clear/normal Other: None CT CERVICAL SPINE FINDINGS Alignment: Normal Skull base and vertebrae: No fracture or primary bone lesion. Some motion degradation in the upper cervical region. Soft tissues and spinal canal: No sign of soft tissue injury. Disc levels: No significant degenerative disc disease is evident. 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 Upper chest: Mild pleural and parenchymal scarring at the apices. Other: None IMPRESSION: HEAD CT: Low-density in the right thalamus of indeterminate age but that could indicate acute or subacute right thalamic infarction. No hemorrhage or mass effect. 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 Chronic small-vessel ischemic changes elsewhere. CERVICAL SPINE CT: No acute or traumatic finding. 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 Some motion degradation. Electronically Signed   By: Paulina Fusi M.D.   On: 03/11/2020 11:44   DG Humerus Left  Result Date: 03/11/2020 CLINICAL DATA:  Weakness. EXAM: LEFT HUMERUS - 2+ VIEW COMPARISON:  None. FINDINGS: There is no evidence of fracture or other focal bone lesions. Soft tissues are unremarkable. IMPRESSION: Negative. Electronically Signed   By: Signa Kell M.D.   On: 03/11/2020 11:31        Alveria Apley, PA-C 03/11/20 1619    Milagros Loll, MD 03/11/20 2225

## 2020-03-11 NOTE — ED Notes (Signed)
Message sent to pharmacy requesting Lantus dose

## 2020-03-11 NOTE — Consult Note (Signed)
Referring Physician: Dr. Evette Doffing    Chief Complaint: Left sided weakness  HPI: Christine Benitez is an 59 y.o. female with a PMHx of CHF, HTN, IDDM, iron deficiency anemia, NICM, CAD, pulmonary hypertension and smoking who presented to the ED on Tuesday morning via EMS after she was found down on the floor at her home with her right eye swollen shut. Police and a co-worker had been called to her home for a well-check after she did not present to work. Per Triage RN, the patient hit head when she fell but remembered the fall and stated that she was too weak to get up. There was no evidence for bowel or bladder incontinence. LKN was Sunday.   Exam by EDP revealed a sleepy patient who roused to sternal rub and was intermittently cooperative. There was a question of left sided facial weakness on exam.   CT head showed low-density in the right thalamus of indeterminate age but could indicate acute or subacute right thalamic infarction. CT C-spine was without acute findings.  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  Home medications include ASA and atorvastatin.   LSN: Sunday tPA Given: No: Out of the time window.   Past Medical History:  Diagnosis Date  . Abnormal LFTs    a. 05/2014 -  ALT 52, alk phos 130.  Marland Kitchen Chronic systolic CHF (congestive heart failure) ([place])    a. Dx 05/2014 - echo at [facility] - EF 30-35%, moderate LVH, no rWMA, mild to moderate MR, moderate PH with PA pressure 60-83mmHg, mild to moderate pericardial effusion. EF 30-35% by cath.  . History of blood transfusion 1986   "related to C-section"  . Hypertension   . Insulin dependent diabetes mellitus ([place])   . Iron deficiency anemia   . NICM (nonischemic cardiomyopathy) ([place])    a. [place] 06/11/14:  minor nonobstructive CAD with mild irregularities in the LAD less than 10-20%, LCx with mild disease less than 20%, no significant disease in the  RCA, EF 30-35%.  . Pericardial effusion    a. Mild-moderate pericardial effusion by echo at [facility].  . Pulmonary hypertension    a. Moderate PH by echo at [facility].  . Tobacco abuse     Past Surgical History:  Procedure Laterality Date  . APPENDECTOMY  ~ 2003  . CESAREAN SECTION  1986  . LEFT HEART CATHETERIZATION WITH CORONARY ANGIOGRAM N/A 06/11/2014   Procedure: LEFT HEART CATHETERIZATION WITH CORONARY ANGIOGRAM;  Surgeon: Peter M Martinique, MD;  Location: [facility] CATH LAB;  Service: Cardiovascular;  Laterality: N/A;    Family History  Problem Relation Age of Onset  . Coronary artery disease Mother   . Diabetes Mother   . Lupus Mother   . Stroke Mother   . Lung cancer Father    Social History:  reports that she quit smoking about 4 years ago. Her smoking use included cigarettes. She has a 11.00 pack-year smoking history. She has never used smokeless tobacco. She reports that she does not drink alcohol and does not use drugs.  Allergies: No Known Allergies  Medications:  No current facility-administered medications on file prior to encounter.   Current Outpatient Medications on File Prior to Encounter  Medication Sig Dispense Refill  . aspirin 81 MG EC tablet TAKE 1 TABLET (81 MG TOTAL) BY MOUTH DAILY. 30 tablet 3  . atorvastatin (LIPITOR) 20 MG tablet TAKE 1 TABLET BY MOUTH EVERY EVENING 30  tablet 0  . carvedilol (COREG) 3.125 MG tablet Take 1 tablet (3.125 mg total) by mouth 2 (two) times daily with a meal. 60 tablet 0  . enalapril (VASOTEC) 2.5 MG tablet TAKE 1 TABLET (2.5 MG TOTAL) BY MOUTH 2 (TWO) TIMES DAILY. 60 tablet 0  . furosemide (LASIX) 20 MG tablet Take 1 tablet (20 mg total) by mouth daily. Appointment needed for future refills 30 tablet 0  . insulin NPH-regular Human (NOVOLIN 70/30) (70-30) 100 UNIT/ML injection Take 25 units with breakfast and 20 units with dinner daily 10 mL 0    Scheduled: . aspirin  300 mg Rectal Daily   Or  . aspirin  325 mg Oral Daily  .  enoxaparin (LOVENOX) injection  40 mg Subcutaneous Q24H  . insulin aspart  0-15 Units Subcutaneous TID WC  . insulin glargine  10 Units Subcutaneous QHS   Continuous: . lactated ringers 75 mL/hr at 03/11/20 2017    ROS: Unable to obtain due to patient disorientation and confusion.   Physical Examination: Blood pressure 123/85, pulse 96, temperature 98.1 F (36.7 C), temperature source Oral, resp. rate 20, height $RemoveBe'5\' 4"'cpanNJQhk$  (1.626 m), weight 53.4 kg, SpO2 95 %.  HEENT: [place]/AT Lungs: Respirations unlabored Ext: No edema  Neurologic Examination: Mental Status: Awake. Does not respond appropriately to questions. Follows only about 50% of verbal commands. Will move RUE purposefully to noxious. Will gaze briefly at examiner. Does not answer any orientation questions correctly. Will correctly name a glove. Does not answer correctly when asked her name. No dysarthria noted. Mildly agitated.  Cranial Nerves: II:  Blinks to threat in R and L visual fields of both eyes. Right pupil 4 mm and slowly reactive. Left pupil 3 mm and briskly reactive.  III,IV, VI: Prominent right ptosis. Right eye is slightly infraducted relative to the left. 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 Left eye tracks normally to left and right. No nystagmus.  V,VII: Left facial droop. Reacts less to left sided than right sided stimuli VIII: hearing intact to voice IX,X: No hypophonia XI: Weak on the left.  XII: Does not protrude tongue to command Motor: RUE and RLE 5/5 to noxious stimuli. Moves RUE to commands but only moves LLE to noxious, although purposefully.  LUE: Flaccid tone with 1-2/5 strength proximally and 3/5 strength distally.  LLE 4/5 withdrawal to noxious.  Sensory: Reacts more briskly to noxious stimuli on the right Deep Tendon Reflexes:  Mild asymmetry is noted in the context of diffusely hypoactive reflexes. Plantars: Right: downgoing   Left: Upgoing Cerebellar: Not cooperative.  Gait: Unable to  assess  Results for orders placed or performed during the hospital encounter of 03/11/20 (from the past 48 hour(s))  CBG monitoring, ED     Status: Abnormal   Collection Time: 03/11/20 10:30 AM  Result Value Ref Range   Glucose-Capillary 308 (H) 70 - 99 mg/dL    Comment: Glucose reference range applies only to samples taken after fasting for at least 8 hours.  CBC with Differential     Status: Abnormal   Collection Time: 03/11/20 10:31 AM  Result Value Ref Range   WBC 7.2 4.0 - 10.5 K/uL   RBC 5.22 (H) 3.87 - 5.11 MIL/uL   Hemoglobin 16.1 (H) 12.0 - 15.0 g/dL   HCT 51.2 (H) 36 - 46 %   MCV 98.1 80.0 - 100.0 fL   MCH 30.8 26.0 - 34.0 pg   MCHC 31.4 30.0 - 36.0 g/dL   RDW 18.7 (  H) 11.5 - 15.5 %   Platelets 251 150 - 400 K/uL   nRBC 0.0 0.0 - 0.2 %   Neutrophils Relative % 79 %   Neutro Abs 5.6 1.7 - 7.7 K/uL   Lymphocytes Relative 14 %   Lymphs Abs 1.0 0.7 - 4.0 K/uL   Monocytes Relative 7 %   Monocytes Absolute 0.5 0.1 - 1.0 K/uL   Eosinophils Relative 0 %   Eosinophils Absolute 0.0 0.0 - 0.5 K/uL   Basophils Relative 0 %   Basophils Absolute 0.0 0.0 - 0.1 K/uL   Immature Granulocytes 0 %   Abs Immature Granulocytes 0.03 0.00 - 0.07 K/uL    Comment: Performed at [facility] [address], [place], [place] [address]  Urinalysis, Routine w reflex microscopic Urine, Clean Catch     Status: Abnormal   Collection Time: 03/11/20 10:31 AM  Result Value Ref Range   Color, Urine YELLOW YELLOW   APPearance HAZY (A) CLEAR   Specific Gravity, Urine 1.010 1.005 - 1.030   pH 7.0 5.0 - 8.0   Glucose, UA >=500 (A) NEGATIVE mg/dL   Hgb urine dipstick NEGATIVE NEGATIVE   Bilirubin Urine NEGATIVE NEGATIVE   Ketones, ur 15 (A) NEGATIVE mg/dL   Protein, ur NEGATIVE NEGATIVE mg/dL   Nitrite NEGATIVE NEGATIVE   Leukocytes,Ua NEGATIVE NEGATIVE    Comment: Performed at [facility] [address], [place], [place] [address]  Lactic acid, plasma     Status: Abnormal    Collection Time: 03/11/20 10:31 AM  Result Value Ref Range   Lactic Acid, Venous 3.7 (HH) 0.5 - 1.9 mmol/L    Comment: CRITICAL RESULT CALLED TO, READ BACK BY AND VERIFIED WITH: PULLIAM,P RN @ 1147 03/11/20 LEONARD,A Performed at [facility], [place] [address] [place], [place] [address]   Ammonia     Status: None   Collection Time: 03/11/20 10:31 AM  Result Value Ref Range   Ammonia 18 9 - 35 umol/L    Comment: Performed at [facility], [place] [address] [place], [place] [address]  Urinalysis, Microscopic (reflex)     Status: Abnormal   Collection Time: 03/11/20 10:31 AM  Result Value Ref Range   RBC / HPF NONE SEEN 0 - 5 RBC/hpf   WBC, UA 0-5 0 - 5 WBC/hpf   Bacteria, UA FEW (A) NONE SEEN   Squamous Epithelial / LPF 21-50 0 - 5    Comment: Performed at [facility], [place] [address], [place], [place] [address]  Protime-INR     Status: None   Collection Time: 03/11/20 12:00 PM  Result Value Ref Range   Prothrombin Time 14.3 11.4 - 15.2 seconds   INR 1.2 0.8 - 1.2    Comment: (NOTE) INR goal varies based on device and disease states. Performed at [facility], [place] [address], [place], [place] [address]   Lactic acid, plasma     Status: Abnormal   Collection Time: 03/11/20 12:10 PM  Result Value Ref Range   Lactic Acid, Venous 3.8 (HH) 0.5 - 1.9 mmol/L    Comment: CRITICAL VALUE NOTED.  VALUE IS CONSISTENT WITH PREVIOUSLY REPORTED AND CALLED VALUE. Performed at [facility], [place] [address], [place], [place] [address]   I-Stat venous blood gas, [facility] ED)     Status: Abnormal   Collection Time: 03/11/20  1:20 PM  Result Value Ref Range   pH, Ven 7.444 (H) 7.25 -  7.43   pCO2, Ven 34.5 (L) 44 - 60 mmHg   pO2, Ven 49.0 (H) 32 - 45 mmHg   Bicarbonate 23.6 20.0 - 28.0 mmol/L   TCO2 25 22 - 32 mmol/L   O2 Saturation 86.0 %   Acid-Base Excess 0.0 0.0 - 2.0 mmol/L   Sodium 137 135 - 145 mmol/L   Potassium 3.9 3.5 - 5.1 mmol/L   Calcium, Ion 0.85 (LL) 1.15 -  1.40 mmol/L   HCT 52.0 (H) 36 - 46 %   Hemoglobin 17.7 (H) 12.0 - 15.0 g/dL   Sample type VENOUS    Comment NOTIFIED PHYSICIAN   Ethanol     Status: None   Collection Time: 03/11/20  1:29 PM  Result Value Ref Range   Alcohol, Ethyl (B) <10 <10 mg/dL    Comment: (NOTE) Lowest detectable limit for serum alcohol is 10 mg/dL.  For medical purposes only. Performed at [facility], [place] [address] [place], [place] [address]   Respiratory Panel by RT PCR (Flu A&B, Covid) - Nasopharyngeal Swab     Status: None   Collection Time: 03/11/20  1:39 PM   Specimen: Nasopharyngeal Swab  Result Value Ref Range   SARS Coronavirus 2 by RT PCR NEGATIVE NEGATIVE    Comment: (NOTE) SARS-CoV-2 target nucleic acids are NOT DETECTED.  The SARS-CoV-2 RNA is generally detectable in upper respiratoy specimens during the acute phase of infection. The lowest concentration of SARS-CoV-2 viral copies this assay can detect is 131 copies/mL. A negative result does not preclude SARS-Cov-2 infection and should not be used as the sole basis for treatment or other patient management decisions. A negative result may occur with  improper specimen collection/handling, submission of specimen other than nasopharyngeal swab, presence of viral mutation(s) within the areas targeted by this assay, and inadequate number of viral copies (<131 copies/mL). A negative result must be combined with clinical observations, patient history, and epidemiological information. The expected result is Negative.  Fact Sheet for Patients:  PinkCheek.be  Fact Sheet for Healthcare Providers:  GravelBags.it  This test is no t yet approved or cleared by the [place] FDA and  has been authorized for detection and/or diagnosis of SARS-CoV-2 by FDA under an Emergency Use Authorization (EUA). This EUA will remain  in effect (meaning this test can be used) for the duration of  the COVID-19 declaration under Section 564(b)(1) of the Act, 21 U.S.C. section 360bbb-3(b)(1), unless the authorization is terminated or revoked sooner.     Influenza A by PCR NEGATIVE NEGATIVE   Influenza B by PCR NEGATIVE NEGATIVE    Comment: (NOTE) The Xpert Xpress SARS-CoV-2/FLU/RSV assay is intended as an aid in  the diagnosis of influenza from Nasopharyngeal swab specimens and  should not be used as a sole basis for treatment. Nasal washings and  aspirates are unacceptable for Xpert Xpress SARS-CoV-2/FLU/RSV  testing.  Fact Sheet for Patients: PinkCheek.be  Fact Sheet for Healthcare Providers: GravelBags.it  This test is not yet approved or cleared by the [place] FDA and  has been authorized for detection and/or diagnosis of SARS-CoV-2 by  FDA under an Emergency Use Authorization (EUA). This EUA will remain  in effect (meaning this test can be used) for the duration of the  Covid-19 declaration under Section 564(b)(1) of the Act, 21  U.S.C. section 360bbb-3(b)(1), unless the authorization is  terminated or revoked. Performed at [facility], [place] [address], [place], [place] [address]   CK  Status: Abnormal   Collection Time: 03/11/20  2:35 PM  Result Value Ref Range   Total CK 243 (H) 38.0 - 234.0 U/L    Comment: Performed at [facility], [place] [address] [place], [place] [address]  Comprehensive metabolic panel     Status: Abnormal   Collection Time: 03/11/20  2:35 PM  Result Value Ref Range   Sodium 138 135 - 145 mmol/L   Potassium 3.4 (L) 3.5 - 5.1 mmol/L   Chloride 95 (L) 98 - 111 mmol/L   CO2 23 22 - 32 mmol/L   Glucose, Bld 322 (H) 70 - 99 mg/dL    Comment: Glucose reference range applies only to samples taken after fasting for at least 8 hours.   BUN 21 (H) 6 - 20 mg/dL   Creatinine, Ser 1.43 (H) 0.44 - 1.00 mg/dL   Calcium 8.4 (L) 8.9 - 10.3 mg/dL   Total Protein 6.8 6.5 - 8.1  g/dL   Albumin 3.1 (L) 3.5 - 5.0 g/dL   AST 27 15 - 41 U/L   ALT 16 0 - 44 U/L   Alkaline Phosphatase 131 (H) 38 - 126 U/L   Total Bilirubin 3.4 (H) 0.3 - 1.2 mg/dL   GFR, Estimated 43 (L) >60 mL/min    Comment: (NOTE) Calculated using the CKD-EPI Creatinine Equation (2021)    Anion gap 20 (H) 5 - 15    Comment: Performed at [facility], [place] [address], [place], [place] [address]   DG Chest 1 View  Result Date: 03/11/2020 CLINICAL DATA:  Weakness. EXAM: CHEST  1 VIEW COMPARISON:  12/17/2019 chest radiograph and prior. FINDINGS: Grossly unchanged right mid to lower lung patchy opacities. New left basilar opacities. No pneumothorax. Small right pleural effusion. Cardiomegaly. No acute osseous abnormality. IMPRESSION: Bibasilar opacities, new on the left since prior exam. Differential includes infection, atelectasis and edema. Cardiomegaly. Electronically Signed   By: Primitivo Gauze M.D.   On: 03/11/2020 11:34   DG Pelvis 1-2 Views  Result Date: 03/11/2020 CLINICAL DATA:  Preprocedure evaluation for MRI, altered level of consciousness EXAM: ABDOMEN - 1 VIEW; PELVIS - 1-2 VIEW COMPARISON:  None. FINDINGS: Abdomen: Supine frontal view of the abdomen demonstrates moderate stool throughout the colon. Punctate calcification in the right lower quadrant likely material within the fecal stream. There are no radiopaque foreign bodies. No acute bony abnormalities. Pelvis: Supine frontal view of the pelvis demonstrates an unremarkable bowel gas pattern. Calcifications in the lower pelvis are likely vascular. There are no radiopaque foreign bodies. No acute displaced fractures. IMPRESSION: 1. No radiopaque foreign body within the abdomen or pelvis. 2. Calcification in the right lower quadrant may be vascular or material within the fecal stream. Pelvic calcifications are almost certainly related to phleboliths. Electronically Signed   By: Randa Ngo M.D.   On: 03/11/2020 18:19   DG Abd 1  View  Result Date: 03/11/2020 CLINICAL DATA:  Preprocedure evaluation for MRI, altered level of consciousness EXAM: ABDOMEN - 1 VIEW; PELVIS - 1-2 VIEW COMPARISON:  None. FINDINGS: Abdomen: Supine frontal view of the abdomen demonstrates moderate stool throughout the colon. Punctate calcification in the right lower quadrant likely material within the fecal stream. There are no radiopaque foreign bodies. No acute bony abnormalities. Pelvis: Supine frontal view of the pelvis demonstrates an unremarkable bowel gas pattern. Calcifications in the lower pelvis are likely vascular. There are no radiopaque foreign bodies. No acute displaced fractures. IMPRESSION: 1. No radiopaque foreign body within the  abdomen or pelvis. 2. Calcification in the right lower quadrant may be vascular or material within the fecal stream. Pelvic calcifications are almost certainly related to phleboliths. Electronically Signed   By: Randa Ngo M.D.   On: 03/11/2020 18:19   CT Head Wo Contrast  Result Date: 03/11/2020 CLINICAL DATA:  Trauma to the head and neck. Abnormal mental status. EXAM: CT HEAD WITHOUT CONTRAST CT CERVICAL SPINE WITHOUT CONTRAST TECHNIQUE: Multidetector CT imaging of the head and cervical spine was performed following the standard protocol without intravenous contrast. Multiplanar CT image reconstructions of the cervical spine were also generated. COMPARISON:  None FINDINGS: CT HEAD FINDINGS Brain: Cerebellar atrophy with a few old small vessel cerebellar infarctions. Low-density in the right thalamus that could indicate acute or subacute right thalamic infarction. No hemorrhage. This extends back into right mid brain. Cerebral hemispheres elsewhere show chronic small-vessel ischemic change of the white matter. 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 No sign of mass lesion, hemorrhage, hydrocephalus or extra-axial collection. Vascular: There is atherosclerotic calcification of the major vessels at  the base of the brain. Skull: Negative Sinuses/Orbits: Clear/normal Other: None CT CERVICAL SPINE FINDINGS Alignment: Normal Skull base and vertebrae: No fracture or primary bone lesion. Some motion degradation in the upper cervical region. Soft tissues and spinal canal: No sign of soft tissue injury. Disc levels: No significant degenerative disc disease is evident. 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 Upper chest: Mild pleural and parenchymal scarring at the apices. Other: None IMPRESSION: HEAD CT: Low-density in the right thalamus of indeterminate age but that could indicate acute or subacute right thalamic infarction. No hemorrhage or mass effect. 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 Chronic small-vessel ischemic changes elsewhere. CERVICAL SPINE CT: No acute or traumatic finding. 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 Some motion degradation. Electronically Signed   By: Nelson Chimes M.D.   On: 03/11/2020 11:44   CT Cervical Spine Wo Contrast  Result Date: 03/11/2020 CLINICAL DATA:  Trauma to the head and neck. Abnormal mental status. EXAM: CT HEAD WITHOUT CONTRAST CT CERVICAL SPINE WITHOUT CONTRAST TECHNIQUE: Multidetector CT imaging of the head and cervical spine was performed following the standard protocol without intravenous contrast. Multiplanar CT image reconstructions of the cervical spine were also generated. COMPARISON:  None FINDINGS: CT HEAD FINDINGS Brain: Cerebellar atrophy with a few old small vessel cerebellar infarctions. Low-density in the right thalamus that could indicate acute or subacute right thalamic infarction. No hemorrhage. This extends back into right mid brain. Cerebral hemispheres elsewhere show chronic small-vessel ischemic change of the white matter. 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 No sign of mass lesion, hemorrhage, hydrocephalus or extra-axial collection. Vascular: There is atherosclerotic calcification of the major vessels at the  base of the brain. Skull: Negative Sinuses/Orbits: Clear/normal Other: None CT CERVICAL SPINE FINDINGS Alignment: Normal Skull base and vertebrae: No fracture or primary bone lesion. Some motion degradation in the upper cervical region. Soft tissues and spinal canal: No sign of soft tissue injury. Disc levels: No significant degenerative disc disease is evident. 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 Upper chest: Mild pleural and parenchymal scarring at the apices. Other: None IMPRESSION: HEAD CT: Low-density in the right thalamus of indeterminate age but that could indicate acute or subacute right thalamic infarction. No hemorrhage or mass effect. 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 Chronic small-vessel ischemic changes elsewhere. CERVICAL SPINE CT: No acute or traumatic finding. 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 Some motion degradation. Electronically Signed   By: Elta Guadeloupe  Shogry M.D.   On: 03/11/2020 11:44   MR BRAIN WO CONTRAST  Result Date: 03/11/2020 CLINICAL DATA:  Mental status change EXAM: MRI HEAD WITHOUT CONTRAST TECHNIQUE: Multiplanar, multiecho pulse sequences of the brain and surrounding structures were obtained without intravenous contrast. COMPARISON:  None. FINDINGS: DWI and axial T2 sequences were attempted. Motion degradation is present. Patient could not tolerate remainder of the study. There is reduced diffusion in the anterior right thalamus extending into the parasagittal right midbrain with corresponding T2 hyperintensity. Prominence of the ventricles and sulci reflects generalized parenchymal volume loss. There is no significant mass effect. Chronic left parietal infarct. Probable chronic microvascular ischemic changes. IMPRESSION: Partial study with motion degradation. 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 Electronically Signed   By: Macy Mis M.D.   On: 03/11/2020 19:10   DG Humerus Left  Result  Date: 03/11/2020 CLINICAL DATA:  Weakness. EXAM: LEFT HUMERUS - 2+ VIEW COMPARISON:  None. FINDINGS: There is no evidence of fracture or other focal bone lesions. Soft tissues are unremarkable. IMPRESSION: Negative. Electronically Signed   By: Kerby Moors M.D.   On: 03/11/2020 11:31   US Abdomen Limited RUQ (LIVER/GB)  Result Date: 03/11/2020 CLINICAL DATA:  Elevated bilirubin EXAM: ULTRASOUND ABDOMEN LIMITED RIGHT UPPER QUADRANT COMPARISON:  03/09/2020 FINDINGS: Gallbladder: Signs of cholelithiasis. Gallbladder wall thickening to 4-5 mm on today's study approximately 3-4 mm on the previous exam. No reported sonographic tenderness over the gallbladder. Cholelithiasis as on the previous exam. Common bile duct: Diameter: 2.3 mm Liver: No focal lesion in the liver on submitted images. Echotexture grossly normal. Portal vein is patent on color Doppler imaging with normal direction of blood flow towards the liver. Other: Echogenic RIGHT kidney and at least moderate RIGHT-sided pleural effusion. IMPRESSION: Findings remain equivocal for acute cholecystitis with gallbladder wall thickening and gallstones. Lack of reported tenderness over the gallbladder. Findings could be seen in generalized edematous state. Correlate with signs of heart failure in this patient with RIGHT-sided effusion. Echogenic RIGHT kidney could be seen in the setting of renal dysfunction/medical renal disease incidentally imaged and incompletely assessed on the current exam. IVC remains prominent as do hepatic veins raising the question congestive hepatopathy. Electronically Signed   By: Zetta Bills M.D.   On: 03/11/2020 16:19   EKG: Sinus tachycardia Biatrial enlargement LVH with secondary repolarization abnormality No significant change since prior 2/17  Assessment: 59 y.o. 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  1. Exam reveals findings referable to both the midbrain and thalamic  infarctions.  2. 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 3. EKG in the ED without atrial fibrillation or flutter 4. Stroke Risk Factors - CHF, HTN, IDDM, CAD and smoking  Recommendations: 1. HgbA1c, fasting lipid panel 2. CTA of head and neck 3. PT consult, OT consult, Speech consult 4. TTE 5. Cardiac telemetry 6. Add Plavix to ASA.  7. Risk factor modification 8. Telemetry monitoring 9. Frequent neuro checks 10. Increase atorvastatin to 40 mg po qd 11. BP management. Out of permissive HTN time window.    '@Electronically'$  signed: Dr. Kerney Elbe 03/11/2020, 8:08 PM

## 2020-03-11 NOTE — ED Notes (Signed)
Placed a external cath on patient pt is resting with call bell in reach 

## 2020-03-11 NOTE — H&P (Addendum)
Date: 03/11/2020               Patient Name:  Christine Benitez MRN: [id number]  DOB: 30-Apr-1961 Age / Sex: 59 y.o., female   PCP: Leonides Sake, MD         Medical Service: Internal Medicine Teaching Service         Attending Physician: Dr. Evette Doffing, Mallie Mussel, *    First Contact: Dr. Collene Gobble Pager: [phone]  Second Contact: Dr. Marva Panda Pager: [phone]       After Hours (After 5p/  First Contact Pager: [phone]  weekends / holidays): Second Contact Pager: [phone]   Chief Complaint: AMS  History of Present Illness:  Christine Benitez is 59yo female with congestive heart failure, type 2 diabetes presenting to ED via EMS for altered mental status. Interview with patient unclear, additional history provided via chart review. Patient says she felt very weak this last Friday. Mentions she was in the bathroom and fell and subsequently hit her head, left arm, and left elbow. Says her supervisor made her come today to be evaluated. States she has not been able to eat the last two days. Denies HA, vision changes, chest pain, dyspnea, abdominal pain. She notes that her doctor's name is Earnestine Mealing and lives in [place]. When asked where she is, she says she is in [facility]  Per chart review, patient was seen Sunday and found to have acute cholecystitis at [facility]. She did not come into work today and was found down in her house. Reportedly had felt weak and could not stand up. No personal or family history of seizures noted. EMS called and brought patient to [facility].  In ED, afebrile, HDS. On exam, patient disoriented, GCS 11, unclear neurologic findings. Labs remarkable for hyperglycemia, elevated lactic and CK, elevated bilirubin and creatinine. Patient received IVF, azithromycin, and Rocephin. IMTS consulted for admission for AMS with multiple possible etiologies, including infection, stroke, and toxins.   Meds: Listed in EMR, needing review Aspirin 81 mg daily Carvedilol 12.5 mg in the  morning and 25 mg at night Enalapril 2.5 mg twice daily Furosemide 40 mg twice daily Humalog 75/25 20 before breakfast and 30 units before dinner Metformin 500 mg twice daily  Allergies: Allergies as of 03/11/2020   (No Known Allergies)   Past Medical History:  Diagnosis Date   Abnormal LFTs    a. 05/2014 -  ALT 52, alk phos 130.   Chronic systolic CHF (congestive heart failure) ([place])    a. Dx 05/2014 - echo at [facility] - EF 30-35%, moderate LVH, no rWMA, mild to moderate MR, moderate PH with PA pressure 60-1mHg, mild to moderate pericardial effusion. EF 30-35% by cath.   History of blood transfusion 1986   "related to C-section"   Hypertension    Insulin dependent diabetes mellitus ([place]    Iron deficiency anemia    NICM (nonischemic cardiomyopathy) ([place]    [place]  minor nonobstructive CAD with mild irregularities in the LAD less than 10-20%, LCx with mild disease less than 20%, no significant disease in the RCA, EF 30-35%.   Pericardial effusion    a. [facility]   Pulmonary hypertension    [facility]. [facility]   Tobacco abuse    Family History:  Family History  Problem Relation Age of Onset   Coronary artery disease Mother    Diabetes Mother    Lupus Mother  Stroke Mother    Lung cancer Father    Social History:  Patient lives by herself.  She denies any alcohol use. Patient unable to respond regarding smoking history or illicit drug use.   Review of Systems: A complete ROS was negative except as per HPI.   Physical Exam: Blood pressure (!) 120/94, pulse (!) 101, temperature 98.1 F (36.7 C), temperature source Oral, resp. rate (!) 27, height 5' 4" (1.626 m), weight 53.4 kg, SpO2 98 %.  Physical Exam Constitutional:      General: She is not in acute distress.    Appearance: She is not toxic-appearing or diaphoretic.  HENT:     Head: Normocephalic and atraumatic.     Mouth/Throat:     Mouth:  Mucous membranes are dry.  Eyes:     General: Lids are normal. No scleral icterus. Cardiovascular:     Rate and Rhythm: Regular rhythm. Tachycardia present.     Heart sounds: Murmur heard.  Systolic murmur is present.   Pulmonary:     Comments: Unable to assess given patient's mental status Abdominal:     General: Abdomen is flat. Bowel sounds are normal. There is no distension.     Palpations: Abdomen is soft.     Tenderness: There is generalized abdominal tenderness.  Musculoskeletal:     Right lower leg: No edema.     Left lower leg: No edema.  Neurological:     Mental Status: She is lethargic and disoriented.     Comments: Unable to assess cranial nerves, motor, or sensation given patient's mental status.     EKG: personally reviewed my interpretation is sinus tachycardia, LVH  CXR: personally reviewed my interpretation is bilateral opacities  Assessment & Plan by Problem:  Christine Benitez is 59yo female with CHF, T2DM admitted 11/2 with altered mental status due to acute stroke.  Principal Problem:   Acute cerebral ischemia Active Problems:   Chronic HFrEF (heart failure with reduced ejection fraction) (HCC)   Diabetes mellitus ([place])   Hypertension   Pulmonary hypertension (HCC)   Chronic cholecystitis with calculus  #Encephalopathy Difficult history to obtain given circumstances, although appears her mental status changed after last being seen on Sunday. Therefore, broad differential. Patient has known cholecystitis and cholelithiasis per ultrasound on Sunday. Although she verbally denies abdominal pain, she has generalized abdominal pain on exam. LFT's normal with elevated TBili, alk phos, which per chart review has been chronic. In addition, patient's chest x-ray on arrival with possible PNA vs atelectasis. No shortness of breath per patient. No leukocytosis on arrival. In ED, it was noticed patient had slurred speech and possible facial droop. CT Head negative, MRI  pending. No seizure history in chart or lacerations on tongue on exam. Also cannot rule out ingestion of toxin or electrolyte abnormality. Current labs not indicative of electrolyte abnormality causing her presentation. Likely patient's hypovolemic state contributing to symptoms. Given her remote history of systolic heart failure will slowly hydrate and work-up possible etiologies. Given she has been afebrile, HDS, no leukocytosis with normal LFT's, will d/c antibiotics and continue to monitor mental status as infectious possibility is less likely. - S/p ceftriaxone, azithromycin, flagyl - MRI pending - UDS pending - BCx, UCx pending - Trend CK, lactic   #Acute Kidney Injury Cr 1.43 on arrival, Cr 1.12 2017. Patient found down, unknown amount of time. No history of CKD on chart, unclear on recent baseline. UA with ketones, glucose. Most likely ketones 2/2 starvation. Will slowly hydrate  and monitor. - IVF - Avoid nephrotoxic meds - Can consider renal u/s, urine studies if does not respond to fluids  #Acute Cholecystitis Patient has chronic abnormal LFT's. Patient was seen on Sunday for RUQ u/s, acute cholecystitis diagnosed. Per chart review, patient had previous findings with previous visits without abdominal pain. General surgery previously consulted, no intervention at that time. On this admission, no leukocytosis, normal LFT's with elevated bilirubin, alk phos. Consulting IR for perc chole. Do not believe this is source of AMS. - Trend CMP  #Systolic Heart Failure Per chart review, has history of systolic heart failure. Echo 2016 with EF 35% and LVH. Unclear if patient is seen by cardiologist or has had recent TTE. Patient dry on exam, will slowly hydrate given cardiac history. Can consider TTE this admission, setting up patient with outpatient cardiology. - Hold home meds (coreg 3.125, enalapril 2.5, furosemide 20) - Can consider TTE  #Type 2 Diabetes Mellitus CBGs on arrival in 300's.  Patient has reportedly not eaten over last few days. Home regimen includes novolin 25 in AM, 20 QHS. Unclear on last A1c. - R/p A1c - SSI, Lantus 10  #Hypertension Patient normotensive on arrival. Will hold home antihypertensives.  Dispo: Admit patient to Inpatient with expected length of stay greater than 2 midnights.  Signed: Sanjuan Dame, MD 03/11/2020, 4:20 PM  Pager: [phone] After 5pm on weekdays and 1pm on weekends: On Call pager: [phone]

## 2020-03-11 NOTE — ED Notes (Signed)
Placed patient on the bedpan patient is now off the pan resting with call bell in reach

## 2020-03-11 NOTE — ED Notes (Signed)
Patient transported to MRI 

## 2020-03-11 NOTE — ED Provider Notes (Signed)
[facility] EMERGENCY DEPARTMENT Provider Note   CSN: [id number] Arrival date & time: 03/11/20  0944     History No chief complaint on file.   Christine Benitez is a 59 y.o. female.  59 year old female with past medical history of diabetes, CHF, brought in by EMS from home. Patient was last seen well on Sunday, did not come to work today so work called for a well check. Patient was found down on the floor in her home, states that she fell but was too weak to get up. Patient requests more warm blankets and voices need to go to the bathroom but will not answer most questions and will not follow most commands. Level 5 caveat applies.         Past Medical History:  Diagnosis Date  . Abnormal LFTs    a. 05/2014 -  ALT 52, alk phos 130.  Marland Kitchen Chronic systolic CHF (congestive heart failure) ([place])    a. Dx 05/2014 - echo at [facility] - EF 30-35%, moderate LVH, no rWMA, mild to moderate MR, moderate PH with PA pressure 60-32mmHg, mild to moderate pericardial effusion. EF 30-35% by cath.  . History of blood transfusion 1986   "related to C-section"  . Hypertension   . Insulin dependent diabetes mellitus ([place])   . Iron deficiency anemia   . NICM (nonischemic cardiomyopathy) ([place])    a. [place] 06/11/14:  minor nonobstructive CAD with mild irregularities in the LAD less than 10-20%, LCx with mild disease less than 20%, no significant disease in the RCA, EF 30-35%.  . Pericardial effusion    a. Mild-moderate pericardial effusion by echo at [facility].  . Pulmonary hypertension    a. Moderate PH by [facility] at [facility].  . Tobacco abuse     Patient Active Problem List   Diagnosis Date Noted  . Chronic systolic heart failure ([place])   . Diabetic ketoacidosis without coma associated with diabetes mellitus due to underlying condition ([place])   . Hyperglycemia   . Essential hypertension   . Chronic systolic congestive heart failure ([place])   . DKA (diabetic ketoacidoses) 06/26/2015  . Abnormal  LFTs   . Pericardial effusion   . Hypertension   . NICM (nonischemic cardiomyopathy) ([place])   . Pulmonary hypertension ([place])   . Acute systolic heart failure ([place]) 06/10/2014  . Tobacco abuse 06/10/2014  . Diabetes mellitus ([place]) 06/10/2014  . Family history of premature CAD 06/10/2014    Past Surgical History:  Procedure Laterality Date  . APPENDECTOMY  ~ 2003  . CESAREAN SECTION  1986  . LEFT HEART CATHETERIZATION WITH CORONARY ANGIOGRAM N/A 06/11/2014   Procedure: LEFT HEART CATHETERIZATION WITH CORONARY ANGIOGRAM;  Surgeon: Peter M Martinique, MD;  Location: [facility] CATH LAB;  Service: Cardiovascular;  Laterality: N/A;     OB History   No obstetric history on file.     Family History  Problem Relation Age of Onset  . Coronary artery disease Mother   . Diabetes Mother   . Lupus Mother   . Stroke Mother   . Lung cancer Father     Social History   Tobacco Use  . Smoking status: Former Smoker    Packs/day: 0.50    Years: 22.00    Pack years: 11.00    Types: Cigarettes    Quit date: 03/26/2015    Years since quitting: 4.9  . Smokeless tobacco: Never Used  Substance Use Topics  . Alcohol use: No  . Drug use:  No    Home Medications Prior to Admission medications   Medication Sig Start Date End Date Taking? Authorizing Provider  aspirin 81 MG EC tablet TAKE 1 TABLET (81 MG TOTAL) BY MOUTH DAILY. 09/10/14   Martinique, Peter M, MD  atorvastatin (LIPITOR) 20 MG tablet TAKE 1 TABLET BY MOUTH EVERY EVENING 07/22/15   Martinique, Peter M, MD  carvedilol (COREG) 3.125 MG tablet Take 1 tablet (3.125 mg total) by mouth 2 (two) times daily with a meal. 06/29/15   Bacigalupo, Dionne Bucy, MD  enalapril (VASOTEC) 2.5 MG tablet TAKE 1 TABLET (2.5 MG TOTAL) BY MOUTH 2 (TWO) TIMES DAILY. 09/16/14   Martinique, Peter M, MD  furosemide (LASIX) 20 MG tablet Take 1 tablet (20 mg total) by mouth daily. Appointment needed for future refills 05/26/15   Isaiah Serge, NP  insulin NPH-regular Human (NOVOLIN 70/30)  (70-30) 100 UNIT/ML injection Take 25 units with breakfast and 20 units with dinner daily 06/29/15   Virginia Crews, MD    Allergies    Patient has no known allergies.  Review of Systems   Review of Systems  Unable to perform ROS: Mental status change    Physical Exam Updated Vital Signs BP 125/88   Pulse 96   Temp 98.1 F (36.7 C) (Oral)   Resp (!) 23   Ht $R'5\' 4"'gj$  (1.626 m)   Wt 53.4 kg   SpO2 97%   BMI 20.21 kg/m   Physical Exam Vitals and nursing note reviewed.  Constitutional:      General: She is not in acute distress.    Appearance: She is well-developed. She is not diaphoretic.  HENT:     Head: Normocephalic and atraumatic.     Mouth/Throat:     Mouth: Mucous membranes are moist.  Eyes:     Pupils: Pupils are equal, round, and reactive to light.  Cardiovascular:     Rate and Rhythm: Normal rate and regular rhythm.     Pulses: Normal pulses.     Heart sounds: Normal heart sounds.  Pulmonary:     Effort: Pulmonary effort is normal.     Breath sounds: Normal breath sounds.  Abdominal:     Palpations: Abdomen is soft.     Tenderness: There is no abdominal tenderness.  Musculoskeletal:        General: No swelling or deformity. Normal range of motion.     Cervical back: No tenderness.     Right lower leg: No edema.     Left lower leg: No edema.  Skin:    General: Skin is warm and dry.     Findings: Bruising present. No erythema or rash.     Comments: Small bruise over left upper arm  Neurological:     Mental Status: She is disoriented.     GCS: GCS eye subscore is 2. GCS verbal subscore is 3. GCS motor subscore is 6.     Deep Tendon Reflexes: Babinski sign absent on the right side. Babinski sign absent on the left side.     Comments: Patient will not move left arm for me, states left upper arm hurts from her fall, does move this extremity for attending physician. No right arm weakness, no lower extremity weakness. Unable to assess for sensory deficit.  Appears to have left facial/mouth weakness.      ED Results / Procedures / Treatments   Labs (all labs ordered are listed, but only abnormal results are displayed) Labs Reviewed  CBC WITH  DIFFERENTIAL/PLATELET - Abnormal; Notable for the following components:      Result Value   RBC 5.22 (*)    Hemoglobin 16.1 (*)    HCT 51.2 (*)    RDW 18.7 (*)    All other components within normal limits  URINALYSIS, ROUTINE W REFLEX MICROSCOPIC - Abnormal; Notable for the following components:   APPearance HAZY (*)    Glucose, UA >=500 (*)    Ketones, ur 15 (*)    All other components within normal limits  LACTIC ACID, PLASMA - Abnormal; Notable for the following components:   Lactic Acid, Venous 3.7 (*)    All other components within normal limits  LACTIC ACID, PLASMA - Abnormal; Notable for the following components:   Lactic Acid, Venous 3.8 (*)    All other components within normal limits  URINALYSIS, MICROSCOPIC (REFLEX) - Abnormal; Notable for the following components:   Bacteria, UA FEW (*)    All other components within normal limits  CBG MONITORING, ED - Abnormal; Notable for the following components:   Glucose-Capillary 308 (*)    All other components within normal limits  I-STAT VENOUS BLOOD GAS, ED - Abnormal; Notable for the following components:   pH, Ven 7.444 (*)    pCO2, Ven 34.5 (*)    pO2, Ven 49.0 (*)    Calcium, Ion 0.85 (*)    HCT 52.0 (*)    Hemoglobin 17.7 (*)    All other components within normal limits  RESPIRATORY PANEL BY RT PCR (FLU A&B, COVID)  URINE CULTURE  CULTURE, BLOOD (ROUTINE X 2)  CULTURE, BLOOD (ROUTINE X 2)  AMMONIA  PROTIME-INR  ETHANOL  RAPID URINE DRUG SCREEN, HOSP PERFORMED  CK  COMPREHENSIVE METABOLIC PANEL    EKG EKG Interpretation  Date/Time:  Tuesday March 11 2020 10:35:22 EDT Ventricular Rate:  103 PR Interval:    QRS Duration: 78 QT Interval:  346 QTC Calculation: 453 R Axis:   69 Text Interpretation: Sinus  tachycardia Biatrial enlargement LVH with secondary repolarization abnormality No significant change since prior 2/17 Confirmed by Aletta Edouard [phone]) on 03/11/2020 10:38:08 AM Also confirmed by Aletta Edouard [phone]), editor Hattie Perch [phone])  on 03/11/2020 12:14:51 PM   Radiology DG Chest 1 View  Result Date: 03/11/2020 CLINICAL DATA:  Weakness. EXAM: CHEST  1 VIEW COMPARISON:  12/17/2019 chest radiograph and prior. FINDINGS: Grossly unchanged right mid to lower lung patchy opacities. New left basilar opacities. No pneumothorax. Small right pleural effusion. Cardiomegaly. No acute osseous abnormality. IMPRESSION: Bibasilar opacities, new on the left since prior exam. Differential includes infection, atelectasis and edema. Cardiomegaly. Electronically Signed   By: Primitivo Gauze M.D.   On: 03/11/2020 11:34   CT Head Wo Contrast  Result Date: 03/11/2020 CLINICAL DATA:  Trauma to the head and neck. Abnormal mental status. EXAM: CT HEAD WITHOUT CONTRAST CT CERVICAL SPINE WITHOUT CONTRAST TECHNIQUE: Multidetector CT imaging of the head and cervical spine was performed following the standard protocol without intravenous contrast. Multiplanar CT image reconstructions of the cervical spine were also generated. COMPARISON:  None FINDINGS: CT HEAD FINDINGS Brain: Cerebellar atrophy with a few old small vessel cerebellar infarctions. Low-density in the right thalamus that could indicate acute or subacute right thalamic infarction. No hemorrhage. This extends back into right mid brain. Cerebral hemispheres elsewhere show chronic small-vessel ischemic change of the white matter. 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 No sign of mass lesion, hemorrhage, hydrocephalus or extra-axial collection. Vascular: There is atherosclerotic calcification of the  major vessels at the base of the brain. Skull: Negative Sinuses/Orbits: Clear/normal Other: None CT CERVICAL SPINE FINDINGS Alignment:  Normal Skull base and vertebrae: No fracture or primary bone lesion. Some motion degradation in the upper cervical region. Soft tissues and spinal canal: No sign of soft tissue injury. Disc levels: No significant degenerative disc disease is evident. 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 Upper chest: Mild pleural and parenchymal scarring at the apices. Other: None IMPRESSION: HEAD CT: Low-density in the right thalamus of indeterminate age but that could indicate acute or subacute right thalamic infarction. No hemorrhage or mass effect. 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 Chronic small-vessel ischemic changes elsewhere. CERVICAL SPINE CT: No acute or traumatic finding. 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 Some motion degradation. Electronically Signed   By: Nelson Chimes M.D.   On: 03/11/2020 11:44   CT Cervical Spine Wo Contrast  Result Date: 03/11/2020 CLINICAL DATA:  Trauma to the head and neck. Abnormal mental status. EXAM: CT HEAD WITHOUT CONTRAST CT CERVICAL SPINE WITHOUT CONTRAST TECHNIQUE: Multidetector CT imaging of the head and cervical spine was performed following the standard protocol without intravenous contrast. Multiplanar CT image reconstructions of the cervical spine were also generated. COMPARISON:  None FINDINGS: CT HEAD FINDINGS Brain: Cerebellar atrophy with a few old small vessel cerebellar infarctions. Low-density in the right thalamus that could indicate acute or subacute right thalamic infarction. No hemorrhage. This extends back into right mid brain. Cerebral hemispheres elsewhere show chronic small-vessel ischemic change of the white matter. 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 No sign of mass lesion, hemorrhage, hydrocephalus or extra-axial collection. Vascular: There is atherosclerotic calcification of the major vessels at the base of the brain. Skull: Negative Sinuses/Orbits: Clear/normal Other: None CT CERVICAL SPINE FINDINGS Alignment:  Normal Skull base and vertebrae: No fracture or primary bone lesion. Some motion degradation in the upper cervical region. Soft tissues and spinal canal: No sign of soft tissue injury. Disc levels: No significant degenerative disc disease is evident. 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 Upper chest: Mild pleural and parenchymal scarring at the apices. Other: None IMPRESSION: HEAD CT: Low-density in the right thalamus of indeterminate age but that could indicate acute or subacute right thalamic infarction. No hemorrhage or mass effect. 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 Chronic small-vessel ischemic changes elsewhere. CERVICAL SPINE CT: No acute or traumatic finding. 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 Some motion degradation. Electronically Signed   By: Nelson Chimes M.D.   On: 03/11/2020 11:44   DG Humerus Left  Result Date: 03/11/2020 CLINICAL DATA:  Weakness. EXAM: LEFT HUMERUS - 2+ VIEW COMPARISON:  None. FINDINGS: There is no evidence of fracture or other focal bone lesions. Soft tissues are unremarkable. IMPRESSION: Negative. Electronically Signed   By: Kerby Moors M.D.   On: 03/11/2020 11:31    Procedures Procedures (including critical care time)  Medications Ordered in ED Medications  cefTRIAXone (ROCEPHIN) 1 g in sodium chloride 0.9 % 100 mL IVPB (has no administration in time range)  sodium chloride 0.9 % bolus 1,000 mL (1,000 mLs Intravenous New Bag/Given (Non-Interop) 03/11/20 1321)    ED Course  I have reviewed the triage vital signs and the nursing notes.  Pertinent labs & imaging results that were available during my care of the patient were reviewed by me and considered in my medical decision making (see chart for details).  Clinical Course as of Mar 11 1506  Tue Mar 11, 2614  2269 59 year old female brought in by EMS  after being found down in her house.  She denies any complaints but has some slurred speech and some facial droop.  Moving all  extremities.  Getting labs imaging   [MB]  3447 59 year old female brought in by EMS, found down at home today, last seen at work on Sunday (2 days ago).  Patient is sleepy, rouses to sternal rub, intermittently cooperative, complains of left arm pain from her fall.  Question left side facial weakness on exam.  History of DKA admission in 2017, question DKA, labs pending. CT head with low-density in the right thalamus of indeterminate age but could indicate acute or subacute right thalamic infarction. CT c-spine without acute findings.  CXR with bibasilar opacities, new on left since prior exam, infection vs atelectasis and edema, cardiomegaly.  Labs are slow to result, CBG 308, chemistry pending. CBC with normal WBC.    [LM]  7867 VBG returns with pH 7.444.  Will start Rocephin for possible PNA. Lactic acid 3.7, given IV fluids although limited due to CHF history.    [LM]  31 Patient's brother at bedside, reviewed available results, brother has not spoken to patient in several days.   [LM]  1406 Care signed out pending pending CMP with plan for admission.    [LM]    Clinical Course User Index [LM] Tacy Learn, PA-C [MB] Hayden Rasmussen, MD   MDM Rules/Calculators/A&P                          Final Clinical Impression(s) / ED Diagnoses Final diagnoses:  Altered mental status, unspecified altered mental status type  Right thalamic infarction [facility])  Hyperglycemia  Community acquired pneumonia, unspecified laterality    Rx / DC Orders ED Discharge Orders    None       Roque Lias 03/11/20 1507    Hayden Rasmussen, MD 03/11/20 1910

## 2020-03-11 NOTE — ED Notes (Signed)
NIH completed with no unilateral deficits noted. Patient updated on plan of care, verbalized understanding, denies further needs at this time.

## 2020-03-11 NOTE — ED Triage Notes (Signed)
After she didn't show up to work, police and coworker came out for a wellness check. Last well Sunday evening at work. Was found down in the floor. Right eye swollen shut. Hit head when she fell but remembers the fall. Reports she was too weak to get up. No evidence of stool or urine on patient or clothing. Recent gallbladder u/s

## 2020-03-11 NOTE — ED Notes (Addendum)
Arm board placed to Lt posterior elbow and secured with ace wrap due to patient not keeping arms straight for medication administration, despite RN reinforcement of same. Patient tolerating well at this time. Patient noted to be HOH, AAOx3.

## 2020-03-12 ENCOUNTER — Inpatient Hospital Stay (HOSPITAL_COMMUNITY): Payer: BC Managed Care – PPO

## 2020-03-12 ENCOUNTER — Encounter (HOSPITAL_COMMUNITY): Payer: Self-pay | Admitting: Student in an Organized Health Care Education/Training Program

## 2020-03-12 DIAGNOSIS — G9340 Encephalopathy, unspecified: Secondary | ICD-10-CM

## 2020-03-12 DIAGNOSIS — E1159 Type 2 diabetes mellitus with other circulatory complications: Secondary | ICD-10-CM

## 2020-03-12 DIAGNOSIS — E119 Type 2 diabetes mellitus without complications: Secondary | ICD-10-CM

## 2020-03-12 DIAGNOSIS — I340 Nonrheumatic mitral (valve) insufficiency: Secondary | ICD-10-CM | POA: Diagnosis not present

## 2020-03-12 DIAGNOSIS — I6782 Cerebral ischemia: Secondary | ICD-10-CM | POA: Diagnosis not present

## 2020-03-12 DIAGNOSIS — R739 Hyperglycemia, unspecified: Secondary | ICD-10-CM

## 2020-03-12 DIAGNOSIS — I6381 Other cerebral infarction due to occlusion or stenosis of small artery: Secondary | ICD-10-CM

## 2020-03-12 DIAGNOSIS — K8010 Calculus of gallbladder with chronic cholecystitis without obstruction: Secondary | ICD-10-CM | POA: Diagnosis present

## 2020-03-12 DIAGNOSIS — I361 Nonrheumatic tricuspid (valve) insufficiency: Secondary | ICD-10-CM

## 2020-03-12 DIAGNOSIS — K811 Chronic cholecystitis: Secondary | ICD-10-CM

## 2020-03-12 DIAGNOSIS — N179 Acute kidney failure, unspecified: Secondary | ICD-10-CM | POA: Diagnosis not present

## 2020-03-12 DIAGNOSIS — I6330 Cerebral infarction due to thrombosis of unspecified cerebral artery: Secondary | ICD-10-CM | POA: Diagnosis present

## 2020-03-12 DIAGNOSIS — Z794 Long term (current) use of insulin: Secondary | ICD-10-CM

## 2020-03-12 DIAGNOSIS — I272 Pulmonary hypertension, unspecified: Secondary | ICD-10-CM

## 2020-03-12 DIAGNOSIS — I6389 Other cerebral infarction: Secondary | ICD-10-CM | POA: Diagnosis not present

## 2020-03-12 DIAGNOSIS — I5022 Chronic systolic (congestive) heart failure: Secondary | ICD-10-CM | POA: Diagnosis not present

## 2020-03-12 LAB — CBG MONITORING, ED
Glucose-Capillary: 238 mg/dL — ABNORMAL HIGH (ref 70–99)
Glucose-Capillary: 184 mg/dL — ABNORMAL HIGH (ref 70–99)
Glucose-Capillary: 113 mg/dL — ABNORMAL HIGH (ref 70–99)

## 2020-03-12 LAB — COMPREHENSIVE METABOLIC PANEL
ALT: 21 U/L (ref 0–44)
AST: 35 U/L (ref 15–41)
Albumin: 3 g/dL — ABNORMAL LOW (ref 3.5–5.0)
Alkaline Phosphatase: 110 U/L (ref 38–126)
Anion gap: 21 — ABNORMAL HIGH (ref 5–15)
BUN: 27 mg/dL — ABNORMAL HIGH (ref 6–20)
CO2: 20 mmol/L — ABNORMAL LOW (ref 22–32)
Calcium: 7.9 mg/dL — ABNORMAL LOW (ref 8.9–10.3)
Chloride: 96 mmol/L — ABNORMAL LOW (ref 98–111)
Creatinine, Ser: 1.67 mg/dL — ABNORMAL HIGH (ref 0.44–1.00)
GFR, Estimated: 35 mL/min — ABNORMAL LOW (ref >60)
Glucose, Bld: 289 mg/dL — ABNORMAL HIGH (ref 70–99)
Potassium: 4.2 mmol/L (ref 3.5–5.1)
Sodium: 137 mmol/L (ref 135–145)
Total Bilirubin: 2.9 mg/dL — ABNORMAL HIGH (ref 0.3–1.2)
Total Protein: 6.2 g/dL — ABNORMAL LOW (ref 6.5–8.1)

## 2020-03-12 LAB — URINE CULTURE

## 2020-03-12 LAB — SODIUM, URINE, RANDOM: Sodium, Ur: 88 mmol/L

## 2020-03-12 LAB — LIPID PANEL
Cholesterol: 80 mg/dL (ref 0–200)
HDL: 24 mg/dL — ABNORMAL LOW (ref >40)
LDL Cholesterol: 41 mg/dL (ref 0–99)
Total CHOL/HDL Ratio: 3.3 RATIO
Triglycerides: 73 mg/dL (ref <150)
VLDL: 15 mg/dL (ref 0–40)

## 2020-03-12 LAB — CREATININE, URINE, RANDOM: Creatinine, Urine: 68.85 mg/dL

## 2020-03-12 LAB — HIV ANTIBODY (ROUTINE TESTING W REFLEX): HIV Screen 4th Generation wRfx: NONREACTIVE

## 2020-03-12 LAB — ECHOCARDIOGRAM COMPLETE
Area-P 1/2: 8.25 cm2
Calc EF: 26.3 %
Height: 64 in
MV M vel: 4.61 m/s
MV Peak grad: 85 mmHg
S' Lateral: 3.9 cm
Single Plane A2C EF: 32.5 %
Single Plane A4C EF: 21.5 %
Weight: 1883.61 oz

## 2020-03-12 LAB — HEMOGLOBIN A1C
Hgb A1c MFr Bld: 7.3 % — ABNORMAL HIGH (ref 4.8–5.6)
Mean Plasma Glucose: 162.81 mg/dL

## 2020-03-12 LAB — RAPID URINE DRUG SCREEN, HOSP PERFORMED
Amphetamines: NOT DETECTED
Barbiturates: NOT DETECTED
Benzodiazepines: NOT DETECTED
Cocaine: NOT DETECTED
Opiates: NOT DETECTED
Tetrahydrocannabinol: NOT DETECTED

## 2020-03-12 LAB — GLUCOSE, CAPILLARY: Glucose-Capillary: 138 mg/dL — ABNORMAL HIGH (ref 70–99)

## 2020-03-12 LAB — CBC
HCT: 43.3 % (ref 36.0–46.0)
Hemoglobin: 13.9 g/dL (ref 12.0–15.0)
MCH: 30.7 pg (ref 26.0–34.0)
MCHC: 32.1 g/dL (ref 30.0–36.0)
MCV: 95.6 fL (ref 80.0–100.0)
Platelets: 203 10*3/uL (ref 150–400)
RBC: 4.53 MIL/uL (ref 3.87–5.11)
RDW: 18.3 % — ABNORMAL HIGH (ref 11.5–15.5)
WBC: 6.7 10*3/uL (ref 4.0–10.5)
nRBC: 0 % (ref 0.0–0.2)

## 2020-03-12 LAB — PROTIME-INR
INR: 1.4 — ABNORMAL HIGH (ref 0.8–1.2)
Prothrombin Time: 16.4 seconds — ABNORMAL HIGH (ref 11.4–15.2)

## 2020-03-12 IMAGING — CT CT ANGIO HEAD
2 of 11 series · 7 of 35 positions shown · IV contrast (OMNI 350)
Comparison: MRI [DATE].
COMPARISON: MRI [DATE].

Addendum:
CLINICAL DATA: Stroke/TIA.  Acute infarct in the right thalamus.

EXAM:
CT HEAD WITHOUT CONTRAST
CT ANGIOGRAPHY OF THE HEAD AND NECK
TECHNIQUE: Contiguous axial images were obtained from the base of the skull
through the vertex without intravenous contrast. Multidetector CT
imaging of the head and neck was performed using the standard
protocol during bolus administration of intravenous contrast.
Multiplanar CT image reconstructions and MIPs were obtained to
evaluate the vascular anatomy. Carotid stenosis measurements (when
applicable) are obtained utilizing NASCET criteria, using the distal
internal carotid diameter as the denominator.
CONTRAST:  80mL OMNIPAQUE IOHEXOL 350 MG/ML SOLN

[Series 9: cta neck · axial · 0.38mm/px · z∈[-218,-114]mm · 2 of 157 slices shown]
[im 53/157  soft-tissue]
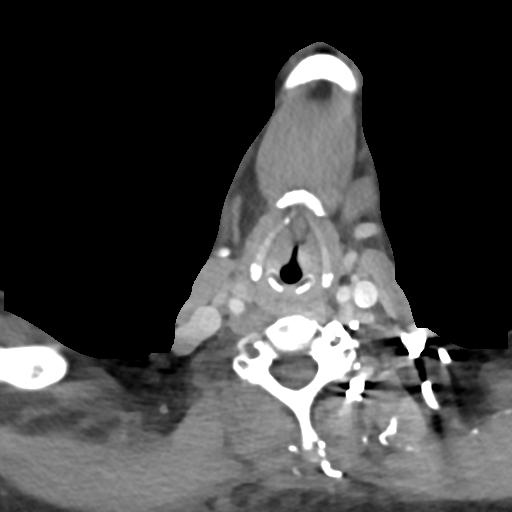
[im 105/157  soft-tissue]
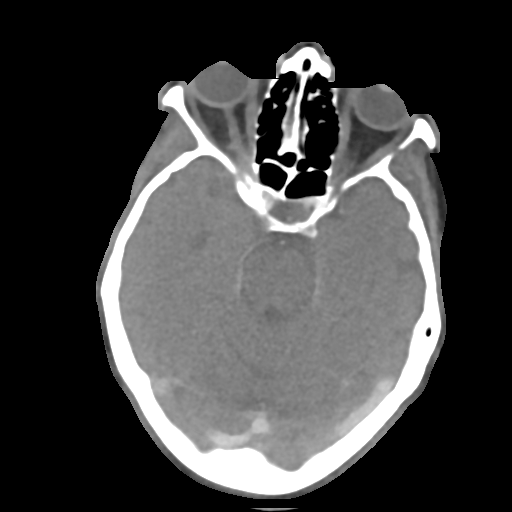

[Series 11: cta neck axial · axial · 0.39mm/px · z∈[-283,-78]mm · 5 of 310 slices shown]
[im 52/310  soft-tissue]
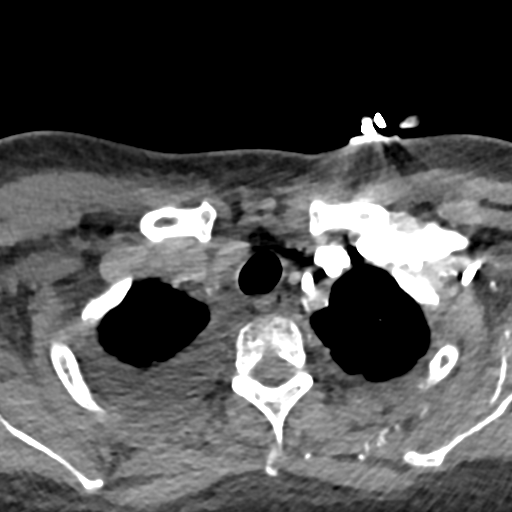
[im 104/310  bone]
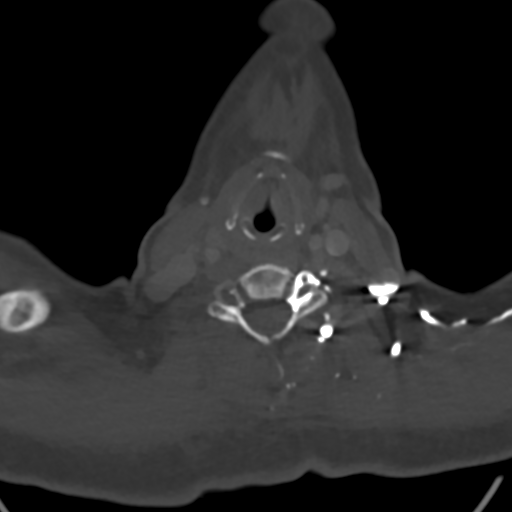
[im 155/310  soft-tissue]
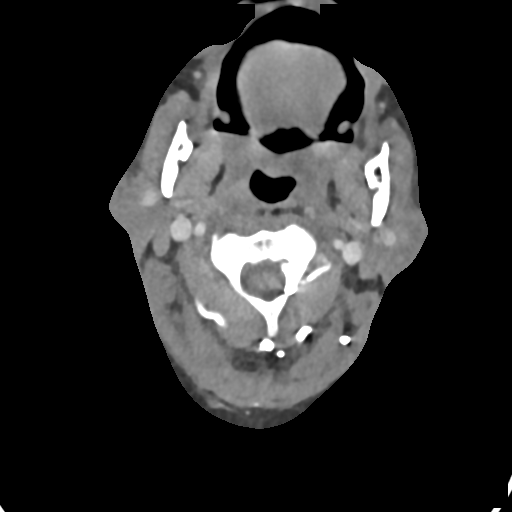
[im 207/310  bone]
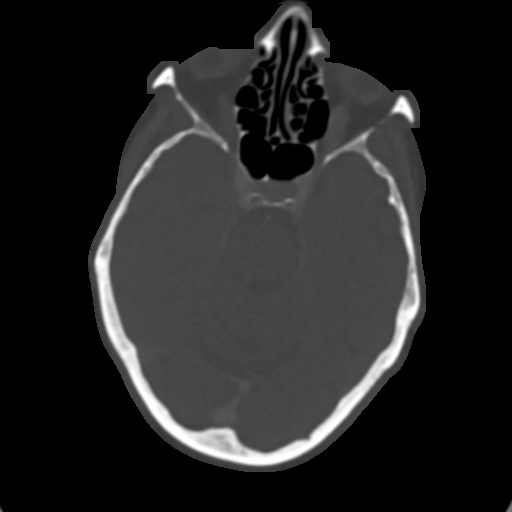
[im 258/310  soft-tissue]
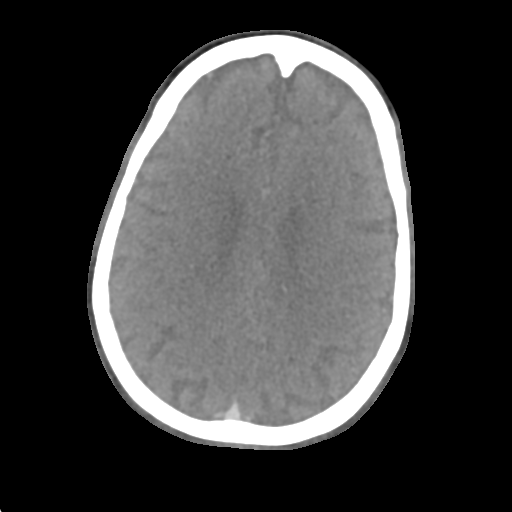

[7 of 35 positions shown; findings below may reference images not displayed]

FINDINGS: CT HEAD

Brain: Hypodensity within the anterior right thalamus and
parasagittal midbrain, compatible with acute infarct seen on recent
MRI. Similar associated edema. No evidence of acute hemorrhage. No
hydrocephalus. No mass lesion. Remote left parietal infarct.
Generalized cerebral atrophy with ex vacuo ventricular dilation.

Skull: No acute fracture.

Sinuses/Orbits: No acute findings.

CTA NECK

Evaluation is significantly limited secondary to contrast bolus
timing and streak artifact from pooled contrast within veins. Within
this limitation:

Aortic arch: Great vessel origins are poorly visualized given streak
artifact from pooled venous contrast.

Right carotid system: No evidence of hemodynamically significant
stenosis or occlusion.

Left carotid system: No evidence hemodynamically significant
stenosis or occlusion.

Vertebral arteries:Nondiagnostic evaluation given venous contrast
timing and streak artifact from surrounding veins in the upper chest
and neck.

Skeleton: No acute findings.

Other neck: No mass or adenopathy.

Upper chest: Right greater than left pleural effusions with
interlobular septal thickening in the lung apices suggesting,
suggesting interstitial edema.

CTA HEAD

Evaluation is limited by venous contrast bolus timing. Within this
limitation:

Anterior circulation: Possible hemodynamically significant stenosis
of the right proximal cavernous carotid (series 11, image 112)
versus artifact. Multifocal mild-to-moderate narrowing of bilateral
M1 and M2 MCAs. No evidence of large vessel occlusion.

Posterior circulation: Diminutive bilateral V4 vertebral arteries,
poorly characterized given contrast timing. Possible severe stenosis
of the distal basilar artery. Bilateral fetal like PCAs with
irregular and diminutive bilateral PCAs. Possible hemodynamically
significant stenosis of the proximal right posterior communicating
artery, poorly visualized.

Venous sinuses: No evidence of dural sinus thrombosis.
IMPRESSION: 1. Redemonstrated acute infarct within the anterior right thalamus,
extending to the parasagittal right midbrain. Similar associated
edema.
2. Evaluation is significantly limited given contrast bolus timing,
particularly in the neck with nondiagnostic evaluation of the
vertebral arteries within the neck. Although evaluation is
significantly limited intracranially, there appears to be a severe
stenosis of the distal basilar artery. The posterior cerebral
arteries appear small bilaterally with multifocal narrowing and
possible hemodynamically significant stenosis of the proximal right
posterior communicating artery.
3. Possible proximal right cavernous internal carotid stenosis
versus artifact.
4. A repeat CTA head/neck when the patient's renal function allows
could better characterize the above findings.
5. Right greater than left pleural effusions with interlobular
septal thickening in the lung apices suggesting, suggesting
interstitial edema.

ADDENDUM:
Findings discussed with Dr. TOLO at [DATE] a.m. via telephone.

*** End of Addendum ***
FINDINGS: CT HEAD

Brain: Hypodensity within the anterior right thalamus and
parasagittal midbrain, compatible with acute infarct seen on recent
MRI. Similar associated edema. No evidence of acute hemorrhage. No
hydrocephalus. No mass lesion. Remote left parietal infarct.
Generalized cerebral atrophy with ex vacuo ventricular dilation.

Skull: No acute fracture.

Sinuses/Orbits: No acute findings.

CTA NECK

Evaluation is significantly limited secondary to contrast bolus
timing and streak artifact from pooled contrast within veins. Within
this limitation:

Aortic arch: Great vessel origins are poorly visualized given streak
artifact from pooled venous contrast.

Right carotid system: No evidence of hemodynamically significant
stenosis or occlusion.

Left carotid system: No evidence hemodynamically significant
stenosis or occlusion.

Vertebral arteries:Nondiagnostic evaluation given venous contrast
timing and streak artifact from surrounding veins in the upper chest
and neck.

Skeleton: No acute findings.

Other neck: No mass or adenopathy.

Upper chest: Right greater than left pleural effusions with
interlobular septal thickening in the lung apices suggesting,
suggesting interstitial edema.

CTA HEAD

Evaluation is limited by venous contrast bolus timing. Within this
limitation:

Anterior circulation: Possible hemodynamically significant stenosis
of the right proximal cavernous carotid (series 11, image 112)
versus artifact. Multifocal mild-to-moderate narrowing of bilateral
M1 and M2 MCAs. No evidence of large vessel occlusion.

Posterior circulation: Diminutive bilateral V4 vertebral arteries,
poorly characterized given contrast timing. Possible severe stenosis
of the distal basilar artery. Bilateral fetal like PCAs with
irregular and diminutive bilateral PCAs. Possible hemodynamically
significant stenosis of the proximal right posterior communicating
artery, poorly visualized.

Venous sinuses: No evidence of dural sinus thrombosis.
IMPRESSION: 1. Redemonstrated acute infarct within the anterior right thalamus,
extending to the parasagittal right midbrain. Similar associated
edema.
2. Evaluation is significantly limited given contrast bolus timing,
particularly in the neck with nondiagnostic evaluation of the
vertebral arteries within the neck. Although evaluation is
significantly limited intracranially, there appears to be a severe
stenosis of the distal basilar artery. The posterior cerebral
arteries appear small bilaterally with multifocal narrowing and
possible hemodynamically significant stenosis of the proximal right
posterior communicating artery.
3. Possible proximal right cavernous internal carotid stenosis
versus artifact.
4. A repeat CTA head/neck when the patient's renal function allows
could better characterize the above findings.
5. Right greater than left pleural effusions with interlobular
septal thickening in the lung apices suggesting, suggesting
interstitial edema.

## 2020-03-12 IMAGING — US US RENAL
1 series · 14 of 25 positions shown · non-contrast
Comparison: None.

CLINICAL DATA: Acute kidney injury

EXAM:
RENAL / URINARY TRACT ULTRASOUND COMPLETE

[Series 1: us renal · 14 of 37 slices shown]
[im 1/37]
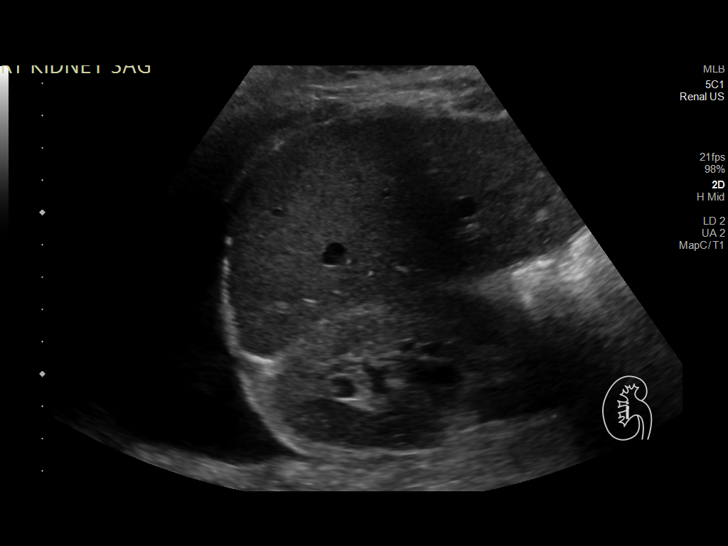
[im 4/37]
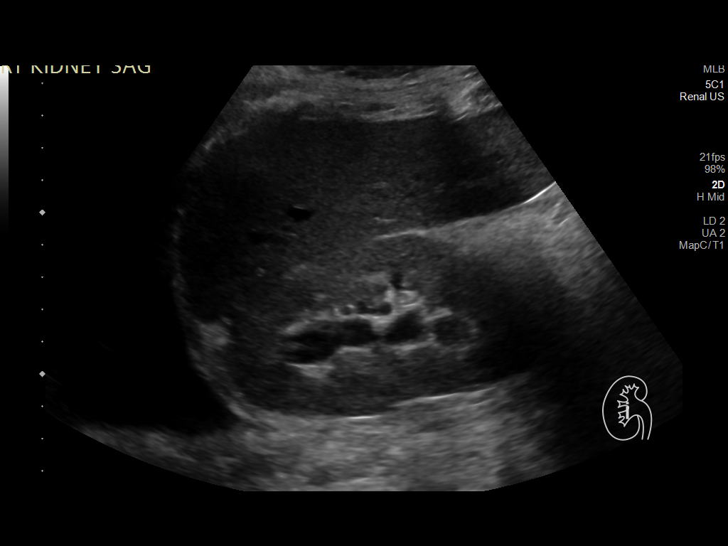
[im 7/37]
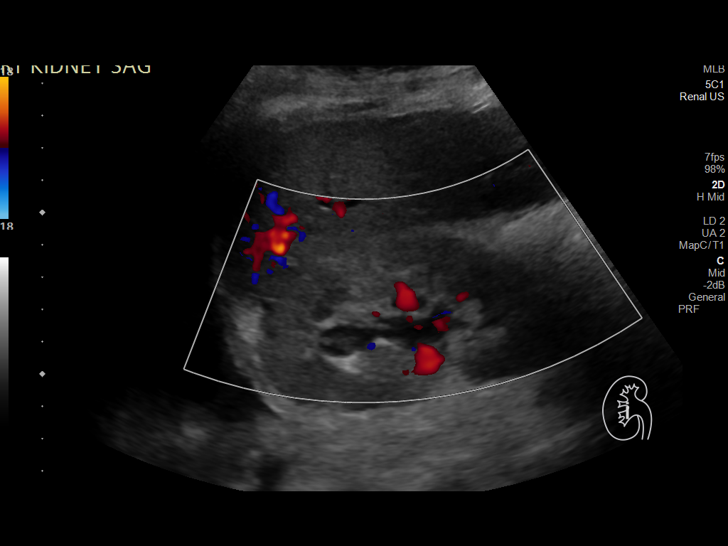
[im 10/37]
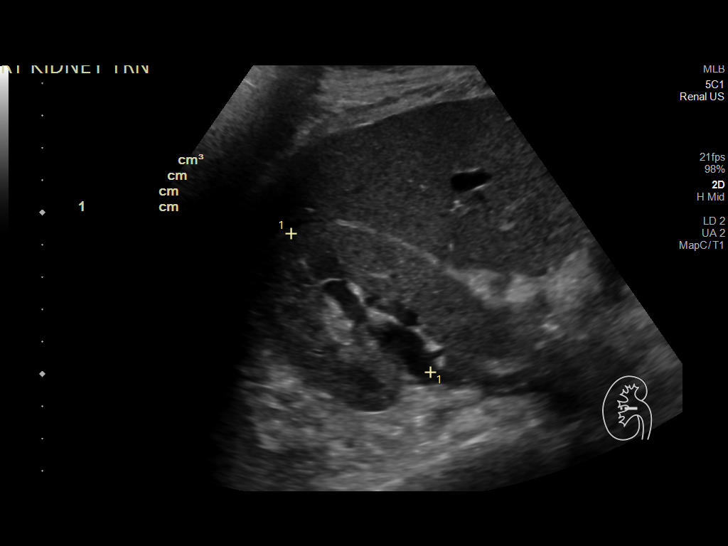
[im 13/37]
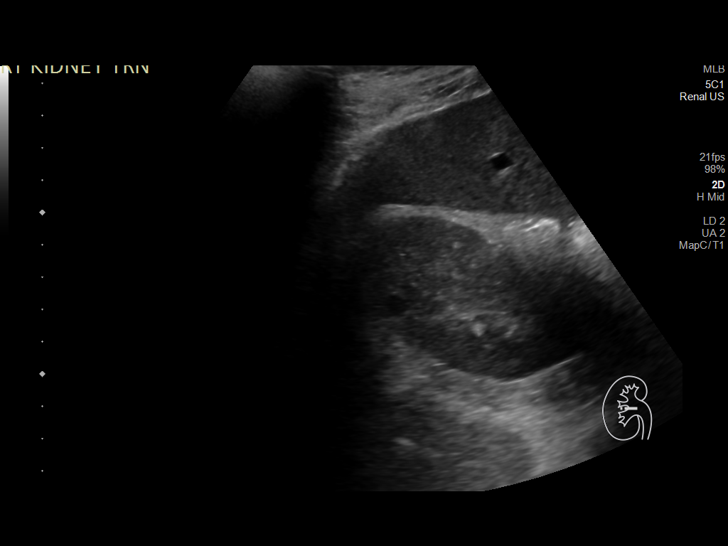
[im 14/37]
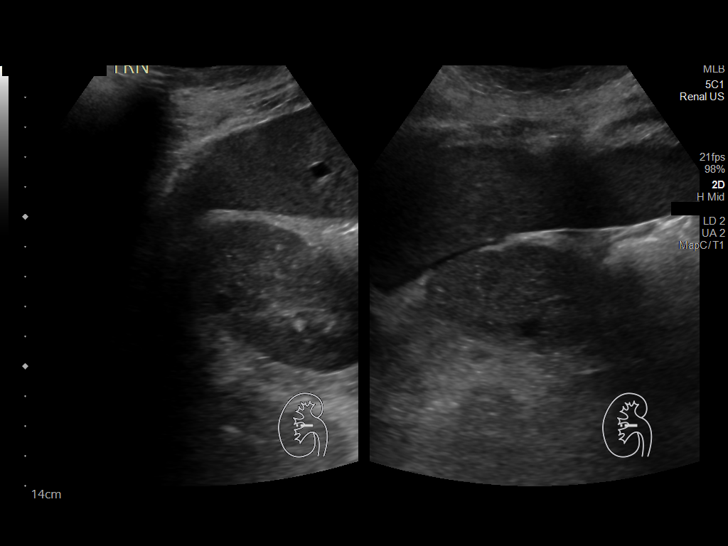
[im 17/37]
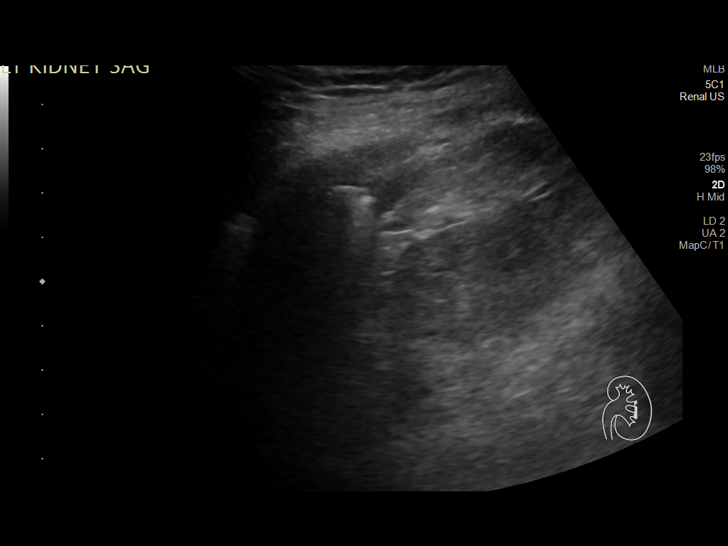
[im 20/37]
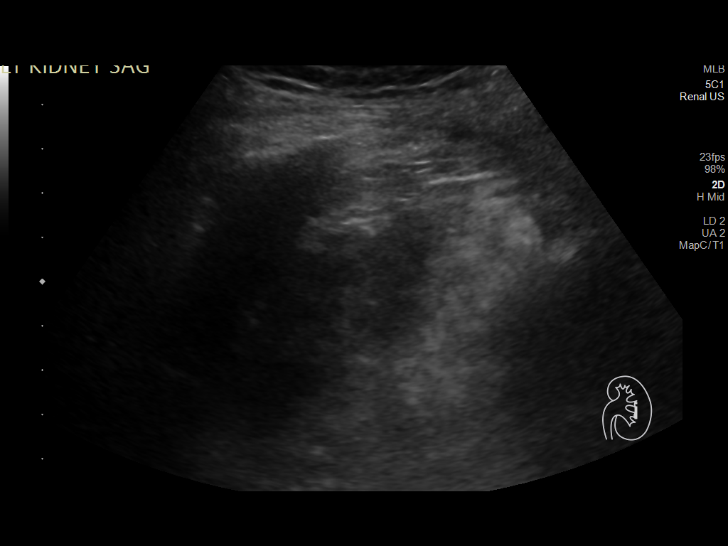
[im 23/37]
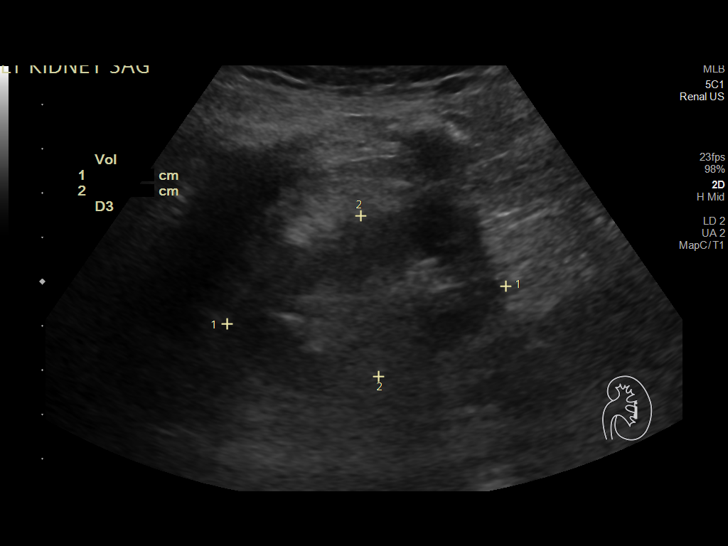
[im 25/37]
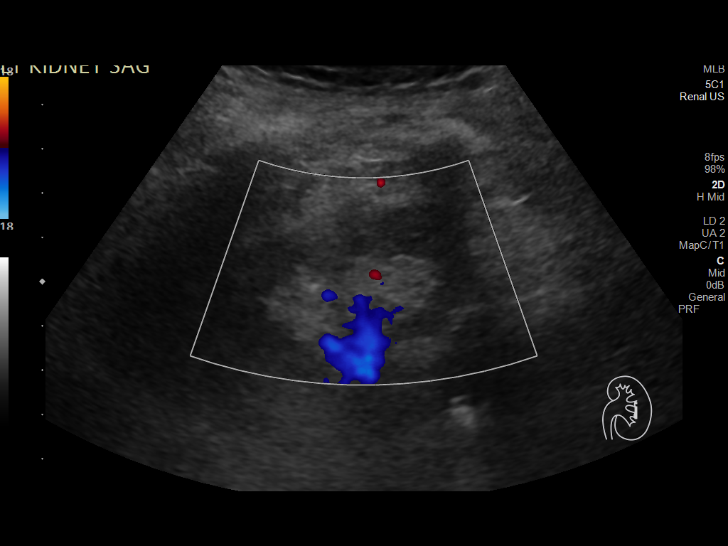
[im 28/37]
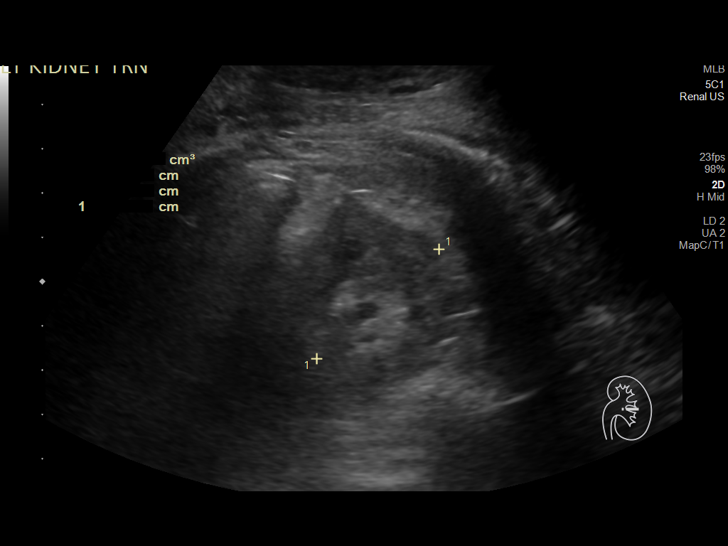
[im 31/37]
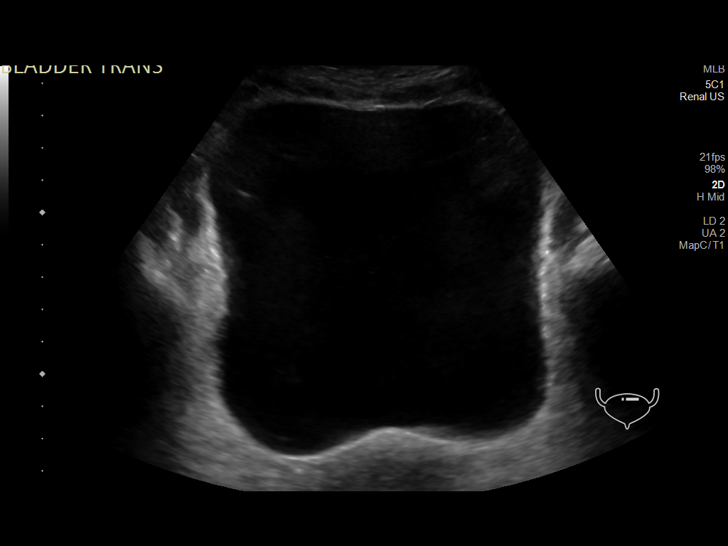
[im 34/37]
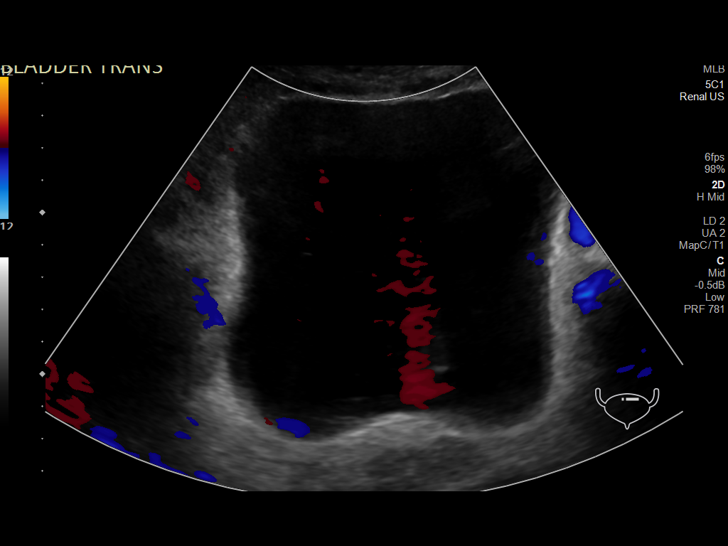
[im 37/37]
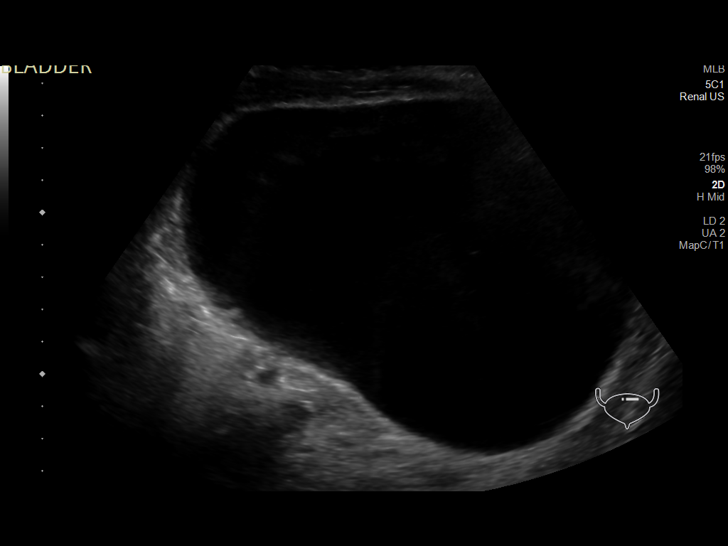

[14 of 25 positions shown; findings below may reference images not displayed]

FINDINGS: Right Kidney:

Renal measurements: 10.3 x 5.7 x 6.1 cm = volume: 186.5 mL.
Echogenicity within normal limits. Mild hydronephrosis.
Subcentimeter interpolar cyst.

Left Kidney:

Renal measurements: 6.4 x 3.7 x 3.7 cm = volume: 45 mL. Echogenicity
within normal limits. No mass or hydronephrosis visualized.

Bladder:

Distended.  Patient unable to void during exam.

Other:

Right pleural effusion.
IMPRESSION: Mild right hydronephrosis.

Distended bladder.  Patient unable to void during exam.

Small left kidney.

Right pleural effusion.

## 2020-03-12 MED ORDER — ASPIRIN EC 325 MG PO TBEC
325.0000 mg | DELAYED_RELEASE_TABLET | Freq: Every day | ORAL | Status: DC
  Administered 2020-03-13 – 2020-03-15 (×3): 325 mg via ORAL
  Filled 2020-03-12 (×3): qty 1

## 2020-03-12 MED ORDER — CLOPIDOGREL BISULFATE 75 MG PO TABS
75.0000 mg | ORAL_TABLET | Freq: Every day | ORAL | Status: DC
  Administered 2020-03-12 – 2020-03-26 (×15): 75 mg via ORAL
  Filled 2020-03-12 (×15): qty 1

## 2020-03-12 MED ORDER — IOHEXOL 350 MG/ML SOLN
80.0000 mL | Freq: Once | INTRAVENOUS | Status: AC | PRN
  Administered 2020-03-12: 80 mL via INTRAVENOUS

## 2020-03-12 MED ORDER — ATORVASTATIN CALCIUM 10 MG PO TABS: 20.0000 mg | ORAL_TABLET | Freq: Every evening | ORAL | Status: DC

## 2020-03-12 MED ORDER — SODIUM CHLORIDE 0.9 % IV SOLN: INTRAVENOUS | Status: DC

## 2020-03-12 MED ORDER — ASPIRIN EC 81 MG PO TBEC
81.0000 mg | DELAYED_RELEASE_TABLET | Freq: Every day | ORAL | Status: DC
  Administered 2020-03-12: 81 mg via ORAL
  Filled 2020-03-12: qty 1

## 2020-03-12 MED ORDER — ATORVASTATIN CALCIUM 40 MG PO TABS
40.0000 mg | ORAL_TABLET | Freq: Every evening | ORAL | Status: DC
  Administered 2020-03-13 – 2020-03-24 (×12): 40 mg via ORAL
  Filled 2020-03-12 (×13): qty 1

## 2020-03-12 NOTE — Progress Notes (Signed)
STROKE TEAM PROGRESS NOTE   INTERVAL HISTORY RN at bedside.  Patient lying in bed, lethargic, hard of hearing.  Still has left-sided weakness, impaired extraocular movement, anisocoria.  MRI showed right thalamic and right midbrain infarct.  CT head and neck concerning for distal basal artery stenosis.  Attempted to call daughter for discussion of potential cerebral angiogram, however, nobody picked up the phone.  Creatinine 1.67, will give gentle IV hydration.  EF 20 to 25%.  Vitals:   03/12/20 0530 03/12/20 0600 03/12/20 0630 03/12/20 0744  BP: 113/81 122/90 (!) 136/94 (!) 128/97  Pulse:   (!) 110 99  Resp: (!) 21 (!) 21 (!) 29 18  Temp:      TempSrc:      SpO2:   95% 100%  Weight:      Height:       CBC:  Recent Labs  Lab 03/11/20 1031 03/11/20 1031 03/11/20 1320 03/12/20 0500  WBC 7.2  --   --  6.7  NEUTROABS 5.6  --   --   --   HGB 16.1*   < > 17.7* 13.9  HCT 51.2*   < > 52.0* 43.3  MCV 98.1  --   --  95.6  PLT 251  --   --  203   < > = values in this interval not displayed.   Basic Metabolic Panel:  Recent Labs  Lab 03/11/20 1435 03/12/20 0500  NA 138 137  K 3.4* 4.2  CL 95* 96*  CO2 23 20*  GLUCOSE 322* 289*  BUN 21* 27*  CREATININE 1.43* 1.67*  CALCIUM 8.4* 7.9*   Lipid Panel:  Recent Labs  Lab 03/12/20 0500  CHOL 80  TRIG 73  HDL 24*  CHOLHDL 3.3  VLDL 15  LDLCALC 41   HgbA1c:  Recent Labs  Lab 03/12/20 0645  HGBA1C 7.3*   Urine Drug Screen: No results for input(s): LABOPIA, COCAINSCRNUR, LABBENZ, AMPHETMU, THCU, LABBARB in the last 168 hours.  Alcohol Level  Recent Labs  Lab 03/11/20 1329  ETH <10    IMAGING past 24 hours DG Chest 1 View  Result Date: 03/11/2020 CLINICAL DATA:  Weakness. EXAM: CHEST  1 VIEW COMPARISON:  12/17/2019 chest radiograph and prior. FINDINGS: Grossly unchanged right mid to lower lung patchy opacities. New left basilar opacities. No pneumothorax. Small right pleural effusion. Cardiomegaly. No acute osseous  abnormality. IMPRESSION: Bibasilar opacities, new on the left since prior exam. Differential includes infection, atelectasis and edema. Cardiomegaly. Electronically Signed   By: Stana Bunting M.D.   On: 03/11/2020 11:34   DG Pelvis 1-2 Views  Result Date: 03/11/2020 CLINICAL DATA:  Preprocedure evaluation for MRI, altered level of consciousness EXAM: ABDOMEN - 1 VIEW; PELVIS - 1-2 VIEW COMPARISON:  None. FINDINGS: Abdomen: Supine frontal view of the abdomen demonstrates moderate stool throughout the colon. Punctate calcification in the right lower quadrant likely material within the fecal stream. There are no radiopaque foreign bodies. No acute bony abnormalities. Pelvis: Supine frontal view of the pelvis demonstrates an unremarkable bowel gas pattern. Calcifications in the lower pelvis are likely vascular. There are no radiopaque foreign bodies. No acute displaced fractures. IMPRESSION: 1. No radiopaque foreign body within the abdomen or pelvis. 2. Calcification in the right lower quadrant may be vascular or material within the fecal stream. Pelvic calcifications are almost certainly related to phleboliths. Electronically Signed   By: Sharlet Salina M.D.   On: 03/11/2020 18:19   DG Abd 1 View  Result Date:  03/11/2020 CLINICAL DATA:  Preprocedure evaluation for MRI, altered level of consciousness EXAM: ABDOMEN - 1 VIEW; PELVIS - 1-2 VIEW COMPARISON:  None. FINDINGS: Abdomen: Supine frontal view of the abdomen demonstrates moderate stool throughout the colon. Punctate calcification in the right lower quadrant likely material within the fecal stream. There are no radiopaque foreign bodies. No acute bony abnormalities. Pelvis: Supine frontal view of the pelvis demonstrates an unremarkable bowel gas pattern. Calcifications in the lower pelvis are likely vascular. There are no radiopaque foreign bodies. No acute displaced fractures. IMPRESSION: 1. No radiopaque foreign body within the abdomen or pelvis. 2.  Calcification in the right lower quadrant may be vascular or material within the fecal stream. Pelvic calcifications are almost certainly related to phleboliths. Electronically Signed   By: Sharlet Salina M.D.   On: 03/11/2020 18:19   CT Head Wo Contrast  Result Date: 03/11/2020 CLINICAL DATA:  Trauma to the head and neck. Abnormal mental status. EXAM: CT HEAD WITHOUT CONTRAST CT CERVICAL SPINE WITHOUT CONTRAST TECHNIQUE: Multidetector CT imaging of the head and cervical spine was performed following the standard protocol without intravenous contrast. Multiplanar CT image reconstructions of the cervical spine were also generated. COMPARISON:  None FINDINGS: CT HEAD FINDINGS Brain: Cerebellar atrophy with a few old small vessel cerebellar infarctions. Low-density in the right thalamus that could indicate acute or subacute right thalamic infarction. No hemorrhage. This extends back into right mid brain. Cerebral hemispheres elsewhere show chronic small-vessel ischemic change of the white matter. 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 No sign of mass lesion, hemorrhage, hydrocephalus or extra-axial collection. Vascular: There is atherosclerotic calcification of the major vessels at the base of the brain. Skull: Negative Sinuses/Orbits: Clear/normal Other: None CT CERVICAL SPINE FINDINGS Alignment: Normal Skull base and vertebrae: No fracture or primary bone lesion. Some motion degradation in the upper cervical region. Soft tissues and spinal canal: No sign of soft tissue injury. Disc levels: No significant degenerative disc disease is evident. 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 Upper chest: Mild pleural and parenchymal scarring at the apices. Other: None IMPRESSION: HEAD CT: Low-density in the right thalamus of indeterminate age but that could indicate acute or subacute right thalamic infarction. No hemorrhage or mass effect. 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 Chronic  small-vessel ischemic changes elsewhere. CERVICAL SPINE CT: No acute or traumatic finding. 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 Some motion degradation. Electronically Signed   By: Paulina Fusi M.D.   On: 03/11/2020 11:44   CT Cervical Spine Wo Contrast  Result Date: 03/11/2020 CLINICAL DATA:  Trauma to the head and neck. Abnormal mental status. EXAM: CT HEAD WITHOUT CONTRAST CT CERVICAL SPINE WITHOUT CONTRAST TECHNIQUE: Multidetector CT imaging of the head and cervical spine was performed following the standard protocol without intravenous contrast. Multiplanar CT image reconstructions of the cervical spine were also generated. COMPARISON:  None FINDINGS: CT HEAD FINDINGS Brain: Cerebellar atrophy with a few old small vessel cerebellar infarctions. Low-density in the right thalamus that could indicate acute or subacute right thalamic infarction. No hemorrhage. This extends back into right mid brain. Cerebral hemispheres elsewhere show chronic small-vessel ischemic change of the white matter. 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 No sign of mass lesion, hemorrhage, hydrocephalus or extra-axial collection. Vascular: There is atherosclerotic calcification of the major vessels at the base of the brain. Skull: Negative Sinuses/Orbits: Clear/normal Other: None CT CERVICAL SPINE FINDINGS Alignment: Normal Skull base and vertebrae: No fracture or primary bone lesion. Some motion degradation in the  upper cervical region. Soft tissues and spinal canal: No sign of soft tissue injury. Disc levels: No significant degenerative disc disease is evident. 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 Upper chest: Mild pleural and parenchymal scarring at the apices. Other: None IMPRESSION: HEAD CT: Low-density in the right thalamus of indeterminate age but that could indicate acute or subacute right thalamic infarction. No hemorrhage or mass effect. 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 Chronic  small-vessel ischemic changes elsewhere. CERVICAL SPINE CT: No acute or traumatic finding. 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 Some motion degradation. Electronically Signed   By: Paulina Fusi M.D.   On: 03/11/2020 11:44   MR BRAIN WO CONTRAST  Result Date: 03/11/2020 CLINICAL DATA:  Mental status change EXAM: MRI HEAD WITHOUT CONTRAST TECHNIQUE: Multiplanar, multiecho pulse sequences of the brain and surrounding structures were obtained without intravenous contrast. COMPARISON:  None. FINDINGS: DWI and axial T2 sequences were attempted. Motion degradation is present. Patient could not tolerate remainder of the study. There is reduced diffusion in the anterior right thalamus extending into the parasagittal right midbrain with corresponding T2 hyperintensity. Prominence of the ventricles and sulci reflects generalized parenchymal volume loss. There is no significant mass effect. Chronic left parietal infarct. Probable chronic microvascular ischemic changes. IMPRESSION: Partial study with motion degradation. 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 Electronically Signed   By: Guadlupe Spanish M.D.   On: 03/11/2020 19:10   DG Humerus Left  Result Date: 03/11/2020 CLINICAL DATA:  Weakness. EXAM: LEFT HUMERUS - 2+ VIEW COMPARISON:  None. FINDINGS: There is no evidence of fracture or other focal bone lesions. Soft tissues are unremarkable. IMPRESSION: Negative. Electronically Signed   By: Signa Kell M.D.   On: 03/11/2020 11:31   US Abdomen Limited RUQ (LIVER/GB)  Result Date: 03/11/2020 CLINICAL DATA:  Elevated bilirubin EXAM: ULTRASOUND ABDOMEN LIMITED RIGHT UPPER QUADRANT COMPARISON:  03/09/2020 FINDINGS: Gallbladder: Signs of cholelithiasis. Gallbladder wall thickening to 4-5 mm on today's study approximately 3-4 mm on the previous exam. No reported sonographic tenderness over the gallbladder. Cholelithiasis as on the previous exam. Common bile  duct: Diameter: 2.3 mm Liver: No focal lesion in the liver on submitted images. Echotexture grossly normal. Portal vein is patent on color Doppler imaging with normal direction of blood flow towards the liver. Other: Echogenic RIGHT kidney and at least moderate RIGHT-sided pleural effusion. IMPRESSION: Findings remain equivocal for acute cholecystitis with gallbladder wall thickening and gallstones. Lack of reported tenderness over the gallbladder. Findings could be seen in generalized edematous state. Correlate with signs of heart failure in this patient with RIGHT-sided effusion. Echogenic RIGHT kidney could be seen in the setting of renal dysfunction/medical renal disease incidentally imaged and incompletely assessed on the current exam. IVC remains prominent as do hepatic veins raising the question congestive hepatopathy. Electronically Signed   By: Donzetta Kohut M.D.   On: 03/11/2020 16:19    PHYSICAL EXAM  Pulse Rate:  [74-110] 100 (11/03 1840) Resp:  [14-52] 18 (11/03 1840) BP: (107-151)/(68-110) 131/90 (11/03 1840) SpO2:  [85 %-100 %] 92 % (11/03 1840)  General - Well nourished, well developed, lethargic.  Ophthalmologic - fundi not visualized due to noncooperation.  Cardiovascular - Regular rhythm and rate.  Neuro - lethargic, eyes closed, but open on voice.  Severe hard of hearing, orientated to months, age and self, not orientated to place and year.  Intermittent speaking with words and sentences, no aphasia, however paucity of speech, able to name 2/3, able  to repeat with dysarthric voice.  Able to follow simple commands.  Blinking to visual threat bilaterally.  Extraocular movement impaired, left eye adduction incomplete, otherwise no significant extraocular movement on request.  Left pupil 2 mm, right pupil 3.5 mm, both reactive to light.  Left facial droop, left palpebral fissure enlarged than right.  Severe hard of hearing.  Tongue midline.  Left upper extremity flaccid.  Left lower  extremity 3 -/5.  Right upper and lower extremity at least 4/5.  Subjectively decreased sensation on the left, however screaming to pain on painful stimulation.  Finger-to-nose intact on the right.  Gait not tested.   ASSESSMENT/PLAN Christine Benitez is a 59 y.o. female with history of of CHF, HTN, IDDM, iron deficiency anemia, NICM, CAD, pulmonary hypertension and smoking found down, presenting with lethargy and L sided weakness .   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 large vessel disease source.  Cardioembolic source cannot be completely excluded due to low EF.  CT head R thalamic hypodensity. Old L parietal cortial and subcortical infarct.   MRI  Anterior R thalamic into parasagittal R midbrain infarct   CTA head & neck Limited eval d/t bolus timing - severe distal BA stenosis, B PCA narrowing and possible proximal R PComA significant stenosis, possible R cavernous ICA stenosis. R>L pleural effusions.    Consider cerebral angiogram to further evaluate basilar artery stenosis on Friday  2D Echo EF 20 to 25%.  LA moderately dilated.  LDL 41  HgbA1c 7.3  VTE prophylaxis - Lovenox 40 mg sq daily   aspirin 81 mg daily prior to admission, now on aspirin 325 mg daily and clopidogrel 75 mg daily.   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 fib  Hypertension  Stable . Home meds including Coreg, Lasix and enalapril . Consider gradually resume home meds if BP allows . Long-term BP goal normotensive  Hyperlipidemia  Home meds:  lipitor 20  Now on lipitor 40  LDL 41,  goal < 70  Continue statin at discharge  Diabetes type II Uncontrolled  HgbA1c 7.3, goal < 7.0  SSI  CBG monitoring  Hyperglycemia  On Lantus  Close PCP follow-up for better DM control  AKI on CKD  Creatinine 1.43-1.67->pending  Gentle hydration  Renal function monitoring   Other Stroke Risk Factors  Former Cigarette smoker, quit 4 yrs ago  Family hx stroke (mother)  Other Active Problems  Pulmonary HTN  Chronic cholecystitis w/ calculus  Hospital day # 1  I spent  35 minutes in total face-to-face time with the patient, more than 50% of which was spent in counseling and coordination of care, reviewing test results, images and medication, and discussing the diagnosis, treatment plan and potential prognosis. This patient's care requiresreview of multiple databases, neurological assessment, discussion with family, other specialists and medical decision making of high complexity.  I discussed with primary team over the phone.  Attempted update daughter and discuss with further testing, however daughter did not pick up the phone.  Marvel Plan, MD PhD Stroke Neurology 03/12/2020 7:18 PM  To  contact Stroke Continuity provider, please refer to [email or web address]. After hours, contact General Neurology

## 2020-03-12 NOTE — Progress Notes (Signed)
  Echocardiogram 2D Echocardiogram has been performed.  Christine Benitez 03/12/2020, 10:00 AM

## 2020-03-12 NOTE — ED Notes (Signed)
Patient continues to remove monitoring equipment and arm board holding Lt arm straight despite multiple attempts at nursing reinforcement of need for all.

## 2020-03-12 NOTE — Evaluation (Signed)
Occupational Therapy Evaluation Patient Details Name: Christine Benitez MRN: [id number] DOB: 10/11/1960 Today's Date: 03/12/2020    History of Present Illness Pt is a 59 y/o female admitted secondary to AMS and fall. 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 PMH includes CHF, HTN, DM, CAD, and pulm HTN.    Clinical Impression   Pt unreliable historian, reports that she was mod I with RW and lived alone as PLOF. Today she is hard to arouse, mod A +2 for bed mobility on ED stretcher. Very fearful of falling even with 2 therapists, unable to answer home set up information, following commands <50% of the time and requiring multimodal cues for MMT. LUE demonstrating ataxia, weakness, and decreased proprioception. Pt will benefit from skilled OT in the acute setting as well as afterwards at the SNF level - next session to assess vision if possible (facial droop still present).     Follow Up Recommendations  SNF    Equipment Recommendations  Other (comment) (defer to next venue of care)    Recommendations for Other Services       Precautions / Restrictions Precautions Precautions: Fall Restrictions Weight Bearing Restrictions: No      Mobility Bed Mobility Overal bed mobility: Needs Assistance Bed Mobility: Supine to Sit;Sit to Supine     Supine to sit: Mod assist;Max assist;+2 for physical assistance Sit to supine: Mod assist;Max assist;+2 for physical assistance   General bed mobility comments: Assist with trunk and LEs to come to sitting. Increased time required. Initially with L lateral lean in sitting.     Transfers Overall transfer level: Needs assistance Equipment used: 2 person hand held assist Transfers: Sit to/from Stand Sit to Stand: Min assist;+2 physical assistance;+2 safety/equipment         General transfer comment: Min A for lift assist and steadying.     Balance Overall balance assessment: Needs assistance Sitting-balance support: No upper extremity supported;Feet  supported;Single extremity supported Sitting balance-Leahy Scale: Poor Sitting balance - Comments: Poor dynamic balance and required UE support to maintain balance.    Standing balance support: Bilateral upper extremity supported;During functional activity Standing balance-Leahy Scale: Poor Standing balance comment: Reliant on UE and external support                            ADL either performed or assessed with clinical judgement   ADL Overall ADL's : Needs assistance/impaired Eating/Feeding: NPO   Grooming: Wash/dry face;Minimal assistance;Cueing for sequencing;Sitting Grooming Details (indicate cue type and reason): attention to task, in combination with proprioceptive difficulties Upper Body Bathing: Moderate assistance   Lower Body Bathing: Moderate assistance   Upper Body Dressing : Moderate assistance   Lower Body Dressing: Maximal assistance Lower Body Dressing Details (indicate cue type and reason): Pt able to physcially complete figure 4 with BLE but unable to coordinate BUE hand tasks for donning socks Toilet Transfer: Minimal assistance;+2 for safety/equipment;BSC;Stand-pivot Toilet Transfer Details (indicate cue type and reason): cues for coordinating movement Toileting- Clothing Manipulation and Hygiene: Maximal assistance;Sit to/from stand       Functional mobility during ADLs: Minimal assistance;+2 for safety/equipment;Cueing for sequencing;Cueing for safety General ADL Comments: decreased safety awareness, cognition, coordination and strength on L side     Vision Patient Visual Report: Other (comment) (requires further assessment)       Perception     Praxis      Pertinent Vitals/Pain Pain Assessment: Faces Faces Pain Scale: No hurt Pain Intervention(s): Monitored  during session     Hand Dominance     Extremity/Trunk Assessment Upper Extremity Assessment Upper Extremity Assessment: LUE deficits/detail;Generalized weakness LUE Deficits  / Details: MMT revealed 3+/5 strength overall in LUE, attention to task and command following were limiting factors, Pt with ataxic movement with finger to nose touch poor proprioception LUE Sensation: decreased proprioception LUE Coordination: decreased fine motor;decreased gross motor   Lower Extremity Assessment Lower Extremity Assessment: Defer to PT evaluation LLE Deficits / Details: Noted functional weakness and decreased coordination. Unable to perform formal MMT secondary to cognitive deficits   Cervical / Trunk Assessment Cervical / Trunk Assessment: Kyphotic   Communication Communication Communication: HOH   Cognition Arousal/Alertness: Lethargic Behavior During Therapy: Flat affect Overall Cognitive Status: No family/caregiver present to determine baseline cognitive functioning Area of Impairment: Attention;Following commands;Safety/judgement;Awareness;Problem solving                   Current Attention Level: Sustained   Following Commands: Follows one step commands inconsistently;Follows one step commands with increased time Safety/Judgement: Decreased awareness of safety;Decreased awareness of deficits Awareness: Intellectual Problem Solving: Slow processing;Decreased initiation;Difficulty sequencing;Requires verbal cues;Requires tactile cues General Comments: Pt only occasionally responding to cues. Lethargic initially, however, alertness improved with change of position. Difficulty following commands as well.    General Comments  no family present - facial droop present    Exercises     Shoulder Instructions      Home Living Family/patient expects to be discharged to:: Private residence Living Arrangements: Alone                               Additional Comments: Limited hx given as pt inconsistently answering questions. Unsure if pt with any family close by.       Prior Functioning/Environment Level of Independence: Independent with  assistive device(s)        Comments: Pt reporting she used RW, however, unsure of accuracy.         OT Problem List: Decreased strength;Decreased activity tolerance;Impaired balance (sitting and/or standing);Decreased coordination;Decreased cognition;Decreased knowledge of use of DME or AE;Decreased safety awareness;Decreased knowledge of precautions;Impaired UE functional use      OT Treatment/Interventions: Self-care/ADL training;Therapeutic exercise;Neuromuscular education;DME and/or AE instruction;Therapeutic activities;Cognitive remediation/compensation;Visual/perceptual remediation/compensation;Patient/family education;Balance training    OT Goals(Current goals can be found in the care plan section) Acute Rehab OT Goals Patient Stated Goal: sleep OT Goal Formulation: With patient Time For Goal Achievement: 03/26/20 Potential to Achieve Goals: Good  OT Frequency: Min 2X/week   Barriers to D/C: Decreased caregiver support  unsure of home living situation - Pt states she lives alone?       Co-evaluation PT/OT/SLP Co-Evaluation/Treatment: Yes Reason for Co-Treatment: Complexity of the patient's impairments (multi-system involvement);Necessary to address cognition/behavior during functional activity;For patient/therapist safety;To address functional/ADL transfers PT goals addressed during session: Mobility/safety with mobility;Balance;Strengthening/ROM OT goals addressed during session: ADL's and self-care;Strengthening/ROM      AM-PAC OT "6 Clicks" Daily Activity     Outcome Measure Help from another person eating meals?: Total (NPO) Help from another person taking care of personal grooming?: A Lot Help from another person toileting, which includes using toliet, bedpan, or urinal?: A Lot Help from another person bathing (including washing, rinsing, drying)?: A Lot Help from another person to put on and taking off regular upper body clothing?: A Lot Help from another person  to put on and taking off regular lower body clothing?: A Lot  6 Click Score: 11   End of Session Equipment Utilized During Treatment: Gait belt Nurse Communication: Mobility status;Other (comment) (recommendations for SNF)  Activity Tolerance: Patient limited by lethargy Patient left: in bed;with call bell/phone within reach (on ED stretcher, door open)  OT Visit Diagnosis: Unsteadiness on feet (R26.81);Other abnormalities of gait and mobility (R26.89);Muscle weakness (generalized) (M62.81);Other symptoms and signs involving the nervous system (R29.898);Other symptoms and signs involving cognitive function;Hemiplegia and hemiparesis Hemiplegia - Right/Left: Left Hemiplegia - dominant/non-dominant: Non-Dominant                Time: 3664-4034 OT Time Calculation (min): 17 min Charges:  OT General Charges $OT Visit: 1 Visit OT Evaluation $OT Eval Moderate Complexity: 1 Mod  Nyoka Cowden OTR/L Acute Rehabilitation Services Pager: [phone] Office: [phone]  Evern Bio Dwain Huhn 03/12/2020, 1:57 PM

## 2020-03-12 NOTE — ED Notes (Signed)
Pt attempting to get up and walk  Cautioned not to do thus

## 2020-03-12 NOTE — ED Notes (Signed)
Patient has repositioned herself sideways in the bed, feet on bedside table. Attempted to reposition patient, patient declined and asked me to turn lights back off. Door remains open to view patient.

## 2020-03-12 NOTE — ED Notes (Signed)
Pt transported to CT ?

## 2020-03-12 NOTE — Progress Notes (Signed)
Subjective:  Patient seen at bedside this AM. She is somnolent but easily arousable. She notes that her head hurts where she fell and hit it.  She is unable to identify physician from overnight but does note that she is here because she fell and hit her head. She is questioning about her gallbladder. Discussed that patient is in hospital because of stroke. Denies any prior history of strokes.   Objective:  Vital signs in last 24 hours: Vitals:   03/12/20 0500 03/12/20 0530 03/12/20 0600 03/12/20 0630  BP: (!) 123/91 113/81 122/90 (!) 136/94  Pulse:    (!) 110  Resp: 17 (!) 21 (!) 21 (!) 29  Temp:      TempSrc:      SpO2:    95%  Weight:      Height:       Physical Exam: General: Ill-appearing, no acute distress CV: Loud holosystolic murmur, parasternal heave present. Distal pulses present. MSK: No pitting edema. Hands, feet cold to touch.  Neuro: Somnolent but arousable, disoriented. L facial droop, Difficult assessment of CN/motor/sensation 2/2 mental status  Assessment/Plan: Christine Benitez is 59yo female with systolic heart failure, type 2 diabetes admitted 11/2 with altered mental status, found to have acute thalamic stroke.  Principal Problem:   Acute cerebral ischemia Active Problems:   Chronic HFrEF (heart failure with reduced ejection fraction) (HCC)   Diabetes mellitus (HCC)   Hypertension   Pulmonary hypertension (HCC)   Chronic cholecystitis with calculus   Cerebral thrombosis with cerebral infarction  #Acute anterior right thalamic infarct #Encephalopathy Patient presenting after being found on the floor in her home. Last known normal Sunday, 10/31. Difficult neurological examination given her current mental status. CT head on arrival negative. MRI obtained, revealed acute anterior right thalamic stroke. Neurology consulted, appreciate recommendations. 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 Evaluation of study difficult given  contrast bolus timing, will consider re-imaging pending renal function.  - A1c pending - TTE - Tele - ASA, Plavix - Lipitor 40mg  qd - LDL 40 (goal <70) - PT/OT  #Chronic systolic heart failure (EF 30-35% in 2017) Previously noted TTE in 2017 with reduced EF of 30-35%. Patient appears dry on exam, no pitting edema. Loud, systolic murmur and parasternal heave present. Bedside u/s revealed enlarged right ventricle, MR. Will obtain formal Echo for further evaluation. - TTE pending - TSH pending - Hold home coreg, furosemide, enalapril  #Acute Kidney Injury Unclear baseline, Cr 1.12 in 2017. Since admission Cr 1.43 > 1.67 s/p IVF. Given no improvement with fluids, will obtain urine studies.  - Pending renal u/s, UNa, UCr - Avoid nephrotoxic meds  #Chronic cholecystitis Multiple recent visits to PCP & OSH regarding elevated liver enzymes and RUQ ultrasound revealing cholecystitis. Previously seen by general surgery and decision to treat medically. On arrival LFT's normal, slightly elevated TBili. On u/s there is gallbladder wall thickening, no dilation of common bile duct. Received ceftriaxone, azithromycin, flagyl in ED. Continues to be afebrile without leukocytosis or other signs of infection. Will hold antibiotics, unlikely source of AMS. Likely will need surgical evaluation following inpatient stay. - Monitor for worsening abdominal pain, signs of biliary obstruction - Trend CMP  #Type 2 Diabetes Mellitus CBG's on arrival 300's, now downtrending. Will continue current regimen as patient increases po intake. Will continue to monitor and adjust insulin as necessary. - SSI, Lantus 10  DIET: Renal IVF: n/a DVT PPX: Lovenox BOWEL: Miralax CODE: FULL  Prior to Admission  Living Arrangement: Home Anticipated Discharge Location: per PT/OT Barriers to Discharge: medical management, mental status Dispo: Anticipated discharge in approximately 3-4 days.  Christine Kanner, MD 03/12/2020,  7:07 AM Pager: [phone] After 5pm on weekdays and 1pm on weekends: On Call pager [phone]

## 2020-03-12 NOTE — ED Notes (Signed)
Put patient on the bedpan patient is now off resting with call bell in reach 

## 2020-03-12 NOTE — ED Notes (Signed)
Patient removed arm board and ace wrap from LUE. IV site dressing clean and dry. Same replaced, additional arm board place anteriorly.

## 2020-03-12 NOTE — ED Notes (Signed)
[  t brought over from another pod the pt makes a noise but when I went in to ask her questions she will not answer.  I was tod that she was hard of hearing but she just lies there

## 2020-03-12 NOTE — ED Notes (Signed)
trport cvalled to lauren rn on

## 2020-03-12 NOTE — ED Notes (Signed)
Placed patient on the bedpan patient is now off resting with call bell in reach

## 2020-03-12 NOTE — ED Notes (Signed)
Pt returned from CT °

## 2020-03-12 NOTE — ED Notes (Signed)
Pt transported to ECHO. 

## 2020-03-12 NOTE — ED Notes (Signed)
The pt has a sl lt facial droop

## 2020-03-12 NOTE — ED Notes (Signed)
Pt yelling out for pain med. States she is having cramps. Pt continues yelling from room that she is going to leave and sign herself out. Dacia RN notified.

## 2020-03-12 NOTE — Evaluation (Signed)
Physical Therapy Evaluation Patient Details Name: Christine Benitez MRN: [id number] DOB: January 02, 1961 Today's Date: 03/12/2020   History of Present Illness  Pt is a 59 y/o female admitted secondary to AMS and fall. 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 PMH includes CHF, HTN, DM, CAD, and pulm HTN.   Clinical Impression  Pt admitted secondary to problem above with deficits below. Pt requiring mod to max A +2 to perform bed mobility and min A +2 to stand and take side steps at edge of stretcher. Pt with notable cognitive deficits and only occasionally responding to questions; unsure of baseline. Noted LLE weakness and decreased coordination. Unsure of family support at home. Feel she will require SNF level therapies prior to return home. Will continue to follow acutely.     Follow Up Recommendations SNF;Supervision/Assistance - 24 hour    Equipment Recommendations  Other (comment) (TBD)    Recommendations for Other Services       Precautions / Restrictions Precautions Precautions: Fall Restrictions Weight Bearing Restrictions: No      Mobility  Bed Mobility Overal bed mobility: Needs Assistance Bed Mobility: Supine to Sit;Sit to Supine     Supine to sit: Mod assist;Max assist;+2 for physical assistance Sit to supine: Mod assist;Max assist;+2 for physical assistance   General bed mobility comments: Assist with trunk and LEs to come to sitting. Increased time required. Initially with L lateral lean in sitting.     Transfers Overall transfer level: Needs assistance Equipment used: 2 person hand held assist Transfers: Sit to/from Stand Sit to Stand: Min assist;+2 physical assistance;+2 safety/equipment         General transfer comment: Min A for lift assist and steadying.   Ambulation/Gait Ambulation/Gait assistance: Min assist;+2 physical assistance;+2 safety/equipment   Assistive device: 2 person hand held assist       General Gait Details: Took side steps at edge of  stretcher. Noted functional weaknes in LLE and slight buckling during stance phase. Also with decreased coordination when taking steps with LLE.   Stairs            Wheelchair Mobility    Modified Rankin (Stroke Patients Only) Modified Rankin (Stroke Patients Only) Pre-Morbid Rankin Score: No significant disability Modified Rankin: Moderately severe disability     Balance Overall balance assessment: Needs assistance Sitting-balance support: No upper extremity supported;Feet supported;Single extremity supported Sitting balance-Leahy Scale: Poor Sitting balance - Comments: Poor dynamic balance and required UE support to maintain balance.    Standing balance support: Bilateral upper extremity supported;During functional activity Standing balance-Leahy Scale: Poor Standing balance comment: Reliant on UE and external support                              Pertinent Vitals/Pain Pain Assessment: Faces Faces Pain Scale: No hurt    Home Living Family/patient expects to be discharged to:: Private residence Living Arrangements: Alone               Additional Comments: Limited hx given as pt inconsistently answering questions. Unsure if pt with any family close by.     Prior Function Level of Independence: Independent with assistive device(s)         Comments: Pt reporting she used RW, however, unsure of accuracy.      Hand Dominance        Extremity/Trunk Assessment   Upper Extremity Assessment Upper Extremity Assessment: Defer to OT evaluation    Lower Extremity Assessment  Lower Extremity Assessment: LLE deficits/detail LLE Deficits / Details: Noted functional weakness and decreased coordination. Unable to perform formal MMT secondary to cognitive deficits    Cervical / Trunk Assessment Cervical / Trunk Assessment: Kyphotic  Communication   Communication: HOH  Cognition Arousal/Alertness: Lethargic Behavior During Therapy: Flat affect Overall  Cognitive Status: No family/caregiver present to determine baseline cognitive functioning                                 General Comments: Pt only occasionally responding to cues. Lethargic initially, however, alertness improved with change of position. Difficulty following commands as well.       General Comments General comments (skin integrity, edema, etc.): No family present     Exercises     Assessment/Plan    PT Assessment Patient needs continued PT services  PT Problem List Decreased strength;Decreased balance;Decreased mobility;Decreased coordination;Decreased cognition;Decreased knowledge of use of DME;Decreased safety awareness;Decreased knowledge of precautions       PT Treatment Interventions DME instruction;Gait training;Therapeutic exercise;Stair training;Functional mobility training;Therapeutic activities;Neuromuscular re-education;Balance training;Cognitive remediation;Patient/family education    PT Goals (Current goals can be found in the Care Plan section)  Acute Rehab PT Goals PT Goal Formulation: Patient unable to participate in goal setting Time For Goal Achievement: 03/26/20 Potential to Achieve Goals: Fair    Frequency Min 3X/week   Barriers to discharge Other (comment) unsure of support at home     Co-evaluation PT/OT/SLP Co-Evaluation/Treatment: Yes Reason for Co-Treatment: Complexity of the patient's impairments (multi-system involvement);Necessary to address cognition/behavior during functional activity;To address functional/ADL transfers PT goals addressed during session: Mobility/safety with mobility;Balance         AM-PAC PT "6 Clicks" Mobility  Outcome Measure Help needed turning from your back to your side while in a flat bed without using bedrails?: A Lot Help needed moving from lying on your back to sitting on the side of a flat bed without using bedrails?: A Lot Help needed moving to and from a bed to a chair (including a  wheelchair)?: A Little Help needed standing up from a chair using your arms (e.g., wheelchair or bedside chair)?: A Little Help needed to walk in hospital room?: A Little Help needed climbing 3-5 steps with a railing? : A Lot 6 Click Score: 15    End of Session Equipment Utilized During Treatment: Gait belt Activity Tolerance: Patient tolerated treatment well Patient left: in bed;with call bell/phone within reach (on stretcher in ED ) Nurse Communication: Mobility status PT Visit Diagnosis: Unsteadiness on feet (R26.81);Muscle weakness (generalized) (M62.81);Other symptoms and signs involving the nervous system (X21.194)    Time: 1740-8144 PT Time Calculation (min) (ACUTE ONLY): 17 min   Charges:   PT Evaluation $PT Eval Moderate Complexity: 1 Mod          Farley Ly, PT, DPT  Acute Rehabilitation Services  Pager: [phone] Office: [phone]   Lehman Prom 03/12/2020, 1:34 PM

## 2020-03-13 ENCOUNTER — Encounter (HOSPITAL_COMMUNITY): Payer: Self-pay | Admitting: Student in an Organized Health Care Education/Training Program

## 2020-03-13 DIAGNOSIS — I10 Essential (primary) hypertension: Secondary | ICD-10-CM

## 2020-03-13 DIAGNOSIS — I6322 Cerebral infarction due to unspecified occlusion or stenosis of basilar arteries: Secondary | ICD-10-CM

## 2020-03-13 DIAGNOSIS — R338 Other retention of urine: Secondary | ICD-10-CM | POA: Diagnosis present

## 2020-03-13 DIAGNOSIS — N179 Acute kidney failure, unspecified: Secondary | ICD-10-CM | POA: Diagnosis not present

## 2020-03-13 DIAGNOSIS — I5022 Chronic systolic (congestive) heart failure: Secondary | ICD-10-CM | POA: Diagnosis not present

## 2020-03-13 DIAGNOSIS — I6782 Cerebral ischemia: Secondary | ICD-10-CM | POA: Diagnosis not present

## 2020-03-13 DIAGNOSIS — I6330 Cerebral infarction due to thrombosis of unspecified cerebral artery: Secondary | ICD-10-CM | POA: Diagnosis not present

## 2020-03-13 LAB — CBC
HCT: 40.7 % (ref 36.0–46.0)
Hemoglobin: 13.3 g/dL (ref 12.0–15.0)
MCH: 31 pg (ref 26.0–34.0)
MCHC: 32.7 g/dL (ref 30.0–36.0)
MCV: 94.9 fL (ref 80.0–100.0)
Platelets: 227 10*3/uL (ref 150–400)
RBC: 4.29 MIL/uL (ref 3.87–5.11)
RDW: 18.1 % — ABNORMAL HIGH (ref 11.5–15.5)
WBC: 6.5 10*3/uL (ref 4.0–10.5)
nRBC: 0 % (ref 0.0–0.2)

## 2020-03-13 LAB — RENAL FUNCTION PANEL
Albumin: 2.9 g/dL — ABNORMAL LOW (ref 3.5–5.0)
Anion gap: 18 — ABNORMAL HIGH (ref 5–15)
BUN: 30 mg/dL — ABNORMAL HIGH (ref 6–20)
CO2: 21 mmol/L — ABNORMAL LOW (ref 22–32)
Calcium: 8.3 mg/dL — ABNORMAL LOW (ref 8.9–10.3)
Chloride: 99 mmol/L (ref 98–111)
Creatinine, Ser: 1.65 mg/dL — ABNORMAL HIGH (ref 0.44–1.00)
GFR, Estimated: 36 mL/min — ABNORMAL LOW (ref >60)
Glucose, Bld: 139 mg/dL — ABNORMAL HIGH (ref 70–99)
Phosphorus: 3.4 mg/dL (ref 2.5–4.6)
Potassium: 3.6 mmol/L (ref 3.5–5.1)
Sodium: 138 mmol/L (ref 135–145)

## 2020-03-13 LAB — GLUCOSE, CAPILLARY
Glucose-Capillary: 123 mg/dL — ABNORMAL HIGH (ref 70–99)
Glucose-Capillary: 90 mg/dL (ref 70–99)
Glucose-Capillary: 83 mg/dL (ref 70–99)
Glucose-Capillary: 133 mg/dL — ABNORMAL HIGH (ref 70–99)
Glucose-Capillary: 103 mg/dL — ABNORMAL HIGH (ref 70–99)

## 2020-03-13 LAB — CREATININE, URINE, RANDOM: Creatinine, Urine: 167.21 mg/dL

## 2020-03-13 LAB — TSH: TSH: 2.668 u[IU]/mL (ref 0.350–4.500)

## 2020-03-13 LAB — SODIUM, URINE, RANDOM: Sodium, Ur: 16 mmol/L

## 2020-03-13 MED ORDER — CHLORHEXIDINE GLUCONATE CLOTH 2 % EX PADS
6.0000 | MEDICATED_PAD | Freq: Every day | CUTANEOUS | Status: DC
  Administered 2020-03-13 – 2020-03-26 (×12): 6 via TOPICAL

## 2020-03-13 NOTE — Consult Note (Signed)
Chief Complaint: Patient was seen in consultation today for basilar artery stenosis  Referring Physician(s): Dr. Erlinda Hong  Supervising Physician: Luanne Bras  Patient Status: [facility] - In-pt  History of Present Illness: Christine Benitez is a 59 y.o. female with history of systolic heart failure, DM2, admitted after fall with left-sided weakness, impaired extraocular movement, right eyelid droop.  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  IR consulted for diagnostic angiogram for further assessment.   Patient assessed at bedside. She is alert and interactive, however engages in conversation about basic needs and immediate concerns.  Does not appears to understand or grasp medical needs/condition.  Mother at bedside and assists with assessment today.  Reports that patient's daughter is incarcerated and unavailable.  Patient is agreeable to procedures, however is not oriented to place or situation.   Past Medical History:  Diagnosis Date  . Abnormal LFTs    a. 05/2014 -  ALT 52, alk phos 130.  Marland Kitchen Chronic systolic CHF (congestive heart failure) ([place])    a. Dx 05/2014 - echo at [facility] - EF 30-35%, moderate LVH, no rWMA, mild to moderate MR, moderate PH with PA pressure 60-11mmHg, mild to moderate pericardial effusion. EF 30-35% by cath.  . History of blood transfusion 1986   "related to C-section"  . Hypertension   . Insulin dependent diabetes mellitus   . Iron deficiency anemia   . NICM (nonischemic cardiomyopathy) ([place])    a. [place] 06/11/14:  minor nonobstructive CAD with mild irregularities in the LAD less than 10-20%, LCx with mild disease less than 20%, no significant disease in the RCA, EF 30-35%.  . Pericardial effusion    a. Mild-moderate pericardial effusion by echo at [facility].  . Pulmonary hypertension ([place])    a. Moderate PH by echo at [facility].  . Tobacco abuse     Past Surgical History:  Procedure Laterality Date  . APPENDECTOMY   ~ 2003  . CESAREAN SECTION  1986  . LEFT HEART CATHETERIZATION WITH CORONARY ANGIOGRAM N/A 06/11/2014   Procedure: LEFT HEART CATHETERIZATION WITH CORONARY ANGIOGRAM;  Surgeon: Peter M Martinique, MD;  Location: [facility] CATH LAB;  Service: Cardiovascular;  Laterality: N/A;    Allergies: Patient has no known allergies.  Medications: Prior to Admission medications   Medication Sig Start Date End Date Taking? Authorizing Provider  albuterol (VENTOLIN HFA) 108 (90 Base) MCG/ACT inhaler Inhale 2 puffs into the lungs every 6 (six) hours as needed for wheezing or shortness of breath. 03/07/20   [provider]  aspirin 81 MG EC tablet TAKE 1 TABLET (81 MG TOTAL) BY MOUTH DAILY. Patient taking differently: Take 81 mg by mouth daily.  09/10/14   Martinique, Peter M, MD  atorvastatin (LIPITOR) 20 MG tablet TAKE 1 TABLET BY MOUTH EVERY EVENING Patient taking differently: Take 20 mg by mouth daily.  07/22/15   Martinique, Peter M, MD  carvedilol (COREG) 12.5 MG tablet Take 12.5-25 mg by mouth See admin instructions. Taking 1 tablet (12.5 mg totally) by mouth in the AM and 2 tablet (25 mg totally) by mouth at night 01/06/20   [provider]  enalapril (VASOTEC) 2.5 MG tablet TAKE 1 TABLET (2.5 MG TOTAL) BY MOUTH 2 (TWO) TIMES DAILY. Patient taking differently: Take 2.5 mg by mouth 2 (two) times daily.  09/16/14   Martinique, Peter M, MD  furosemide (LASIX) 40 MG tablet Take 40 mg by mouth 2 (two) times daily. 03/05/20   [provider]  HUMALOG MIX 75/25 (75-25) 100 UNIT/ML SUSP injection Inject 20-30 Units into the skin See admin instructions. Inject 20 units into the skin before breakfast and 30 units before dinner 02/01/20   [provider]  insulin NPH-regular Human (NOVOLIN 70/30) (70-30) 100 UNIT/ML injection Take 25 units with breakfast and 20 units with dinner daily Patient not taking: Reported on 03/12/2020 06/29/15   Erasmo Downer, MD  metFORMIN (GLUCOPHAGE) 500 MG tablet Take 500  mg by mouth 2 (two) times daily. 01/06/20   [provider]     Family History  Problem Relation Age of Onset  . Coronary artery disease Mother   . Diabetes Mother   . Lupus Mother   . Stroke Mother   . Lung cancer Father     Social History   Socioeconomic History  . Marital status: Divorced    Spouse name: Not on file  . Number of children: Not on file  . Years of education: Not on file  . Highest education level: Not on file  Occupational History  . Not on file  Tobacco Use  . Smoking status: Former Smoker    Packs/day: 0.50    Years: 22.00    Pack years: 11.00    Types: Cigarettes    Quit date: 03/26/2015    Years since quitting: 4.9  . Smokeless tobacco: Never Used  Substance and Sexual Activity  . Alcohol use: No  . Drug use: No  . Sexual activity: Yes  Other Topics Concern  . Not on file  Social History Narrative  . Not on file   Social Determinants of Health   Financial Resource Strain:   . Difficulty of Paying Living Expenses: Not on file  Food Insecurity:   . Worried About Programme researcher, broadcasting/film/video in the Last Year: Not on file  . Ran Out of Food in the Last Year: Not on file  Transportation Needs:   . Lack of Transportation (Medical): Not on file  . Lack of Transportation (Non-Medical): Not on file  Physical Activity:   . Days of Exercise per Week: Not on file  . Minutes of Exercise per Session: Not on file  Stress:   . Feeling of Stress : Not on file  Social Connections:   . Frequency of Communication with Friends and Family: Not on file  . Frequency of Social Gatherings with Friends and Family: Not on file  . Attends Religious Services: Not on file  . Active Member of Clubs or Organizations: Not on file  . Attends Banker Meetings: Not on file  . Marital Status: Not on file     Review of Systems: A 12 point ROS discussed and pertinent positives are indicated in the HPI above.  All other systems are negative.  Review of  Systems  Constitutional: Negative for fatigue and fever.  Respiratory: Negative for cough and shortness of breath.   Cardiovascular: Negative for chest pain.  Gastrointestinal: Negative for abdominal pain.  Musculoskeletal: Negative for back pain.  Psychiatric/Behavioral: Negative for behavioral problems and confusion.    Vital Signs: BP (!) 129/96 (BP Location: Right Arm)   Pulse 97   Temp 97.9 F (36.6 C) (Oral)   Resp 16   Ht 5\' 4"  (1.626 m)   Wt 120 lb 13 oz (54.8 kg)   SpO2 98%   BMI 20.74 kg/m   Physical Exam Vitals and nursing note reviewed.  Constitutional:      General: She is  not in acute distress.    Appearance: Normal appearance. She is not ill-appearing.  HENT:     Mouth/Throat:     Mouth: Mucous membranes are moist.     Pharynx: Oropharynx is clear.  Cardiovascular:     Rate and Rhythm: Normal rate and regular rhythm.  Pulmonary:     Effort: Pulmonary effort is normal.     Breath sounds: Normal breath sounds.  Skin:    General: Skin is warm and dry.  Neurological:     Mental Status: She is alert. Mental status is at baseline.     Comments: Right facial droop, hard of hearing  Psychiatric:        Mood and Affect: Mood normal.        Behavior: Behavior normal.        Thought Content: Thought content normal.        Judgment: Judgment normal.      MD Evaluation Airway: WNL Heart: WNL Abdomen: WNL Chest/ Lungs: WNL ASA  Classification: 3 Mallampati/Airway Score: Two   Imaging: CT ANGIO HEAD W OR WO CONTRAST  Addendum Date: 03/12/2020   ADDENDUM REPORT: 03/12/2020 09:31 ADDENDUM: Findings discussed with Dr. Evette Doffing at 9:30 a.m. via telephone. Electronically Signed   By: Margaretha Sheffield MD   On: 03/12/2020 09:31   Result Date: 03/12/2020 CLINICAL DATA:  Stroke/TIA.  Acute infarct in the right thalamus. EXAM: CT HEAD WITHOUT CONTRAST CT ANGIOGRAPHY OF THE HEAD AND NECK TECHNIQUE: Contiguous axial images were obtained from the base of the skull  through the vertex without intravenous contrast. Multidetector CT imaging of the head and neck was performed using the standard protocol during bolus administration of intravenous contrast. Multiplanar CT image reconstructions and MIPs were obtained to evaluate the vascular anatomy. Carotid stenosis measurements (when applicable) are obtained utilizing NASCET criteria, using the distal internal carotid diameter as the denominator. CONTRAST:  22mL OMNIPAQUE IOHEXOL 350 MG/ML SOLN COMPARISON:  MRI March 11, 2020. FINDINGS: CT HEAD Brain: Hypodensity within the anterior right thalamus and parasagittal midbrain, compatible with acute infarct seen on recent MRI. Similar associated edema. No evidence of acute hemorrhage. No hydrocephalus. No mass lesion. Remote left parietal infarct. Generalized cerebral atrophy with ex vacuo ventricular dilation. Skull: No acute fracture. Sinuses/Orbits: No acute findings. CTA NECK Evaluation is significantly limited secondary to contrast bolus timing and streak artifact from pooled contrast within veins. Within this limitation: Aortic arch: Great vessel origins are poorly visualized given streak artifact from pooled venous contrast. Right carotid system: No evidence of hemodynamically significant stenosis or occlusion. Left carotid system: No evidence hemodynamically significant stenosis or occlusion. Vertebral arteries:Nondiagnostic evaluation given venous contrast timing and streak artifact from surrounding veins in the upper chest and neck. Skeleton: No acute findings. Other neck: No mass or adenopathy. Upper chest: Right greater than left pleural effusions with interlobular septal thickening in the lung apices suggesting, suggesting interstitial edema. CTA HEAD Evaluation is limited by venous contrast bolus timing. Within this limitation: Anterior circulation: Possible hemodynamically significant stenosis of the right proximal cavernous carotid (series 11, image 112) versus  artifact. Multifocal mild-to-moderate narrowing of bilateral M1 and M2 MCAs. No evidence of large vessel occlusion. Posterior circulation: Diminutive bilateral V4 vertebral arteries, poorly characterized given contrast timing. Possible severe stenosis of the distal basilar artery. Bilateral fetal like PCAs with irregular and diminutive bilateral PCAs. Possible hemodynamically significant stenosis of the proximal right posterior communicating artery, poorly visualized. Venous sinuses: No evidence of dural sinus thrombosis. IMPRESSION: 1. Redemonstrated acute  infarct within the anterior right thalamus, extending to the parasagittal right midbrain. Similar associated edema. 2. Evaluation is significantly limited given contrast bolus timing, particularly in the neck with nondiagnostic evaluation of the vertebral arteries within the neck. Although evaluation is significantly limited intracranially, there appears to be a severe stenosis of the distal basilar artery. The posterior cerebral arteries appear small bilaterally with multifocal narrowing and possible hemodynamically significant stenosis of the proximal right posterior communicating artery. 3. Possible proximal right cavernous internal carotid stenosis versus artifact. 4. A repeat CTA head/neck when the patient's renal function allows could better characterize the above findings. 5. Right greater than left pleural effusions with interlobular septal thickening in the lung apices suggesting, suggesting interstitial edema. Electronically Signed: By: Margaretha Sheffield MD On: 03/12/2020 09:24   DG Chest 1 View  Result Date: 03/11/2020 CLINICAL DATA:  Weakness. EXAM: CHEST  1 VIEW COMPARISON:  12/17/2019 chest radiograph and prior. FINDINGS: Grossly unchanged right mid to lower lung patchy opacities. New left basilar opacities. No pneumothorax. Small right pleural effusion. Cardiomegaly. No acute osseous abnormality. IMPRESSION: Bibasilar opacities, new on the left  since prior exam. Differential includes infection, atelectasis and edema. Cardiomegaly. Electronically Signed   By: Primitivo Gauze M.D.   On: 03/11/2020 11:34   DG Pelvis 1-2 Views  Result Date: 03/11/2020 CLINICAL DATA:  Preprocedure evaluation for MRI, altered level of consciousness EXAM: ABDOMEN - 1 VIEW; PELVIS - 1-2 VIEW COMPARISON:  None. FINDINGS: Abdomen: Supine frontal view of the abdomen demonstrates moderate stool throughout the colon. Punctate calcification in the right lower quadrant likely material within the fecal stream. There are no radiopaque foreign bodies. No acute bony abnormalities. Pelvis: Supine frontal view of the pelvis demonstrates an unremarkable bowel gas pattern. Calcifications in the lower pelvis are likely vascular. There are no radiopaque foreign bodies. No acute displaced fractures. IMPRESSION: 1. No radiopaque foreign body within the abdomen or pelvis. 2. Calcification in the right lower quadrant may be vascular or material within the fecal stream. Pelvic calcifications are almost certainly related to phleboliths. Electronically Signed   By: Randa Ngo M.D.   On: 03/11/2020 18:19   DG Abd 1 View  Result Date: 03/11/2020 CLINICAL DATA:  Preprocedure evaluation for MRI, altered level of consciousness EXAM: ABDOMEN - 1 VIEW; PELVIS - 1-2 VIEW COMPARISON:  None. FINDINGS: Abdomen: Supine frontal view of the abdomen demonstrates moderate stool throughout the colon. Punctate calcification in the right lower quadrant likely material within the fecal stream. There are no radiopaque foreign bodies. No acute bony abnormalities. Pelvis: Supine frontal view of the pelvis demonstrates an unremarkable bowel gas pattern. Calcifications in the lower pelvis are likely vascular. There are no radiopaque foreign bodies. No acute displaced fractures. IMPRESSION: 1. No radiopaque foreign body within the abdomen or pelvis. 2. Calcification in the right lower quadrant may be vascular or  material within the fecal stream. Pelvic calcifications are almost certainly related to phleboliths. Electronically Signed   By: Randa Ngo M.D.   On: 03/11/2020 18:19   CT Head Wo Contrast  Result Date: 03/11/2020 CLINICAL DATA:  Trauma to the head and neck. Abnormal mental status. EXAM: CT HEAD WITHOUT CONTRAST CT CERVICAL SPINE WITHOUT CONTRAST TECHNIQUE: Multidetector CT imaging of the head and cervical spine was performed following the standard protocol without intravenous contrast. Multiplanar CT image reconstructions of the cervical spine were also generated. COMPARISON:  None FINDINGS: CT HEAD FINDINGS Brain: Cerebellar atrophy with a few old small vessel cerebellar infarctions. Low-density in  the right thalamus that could indicate acute or subacute right thalamic infarction. No hemorrhage. This extends back into right mid brain. Cerebral hemispheres elsewhere show chronic small-vessel ischemic change of the white matter. 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 No sign of mass lesion, hemorrhage, hydrocephalus or extra-axial collection. Vascular: There is atherosclerotic calcification of the major vessels at the base of the brain. Skull: Negative Sinuses/Orbits: Clear/normal Other: None CT CERVICAL SPINE FINDINGS Alignment: Normal Skull base and vertebrae: No fracture or primary bone lesion. Some motion degradation in the upper cervical region. Soft tissues and spinal canal: No sign of soft tissue injury. Disc levels: No significant degenerative disc disease is evident. 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 Upper chest: Mild pleural and parenchymal scarring at the apices. Other: None IMPRESSION: HEAD CT: Low-density in the right thalamus of indeterminate age but that could indicate acute or subacute right thalamic infarction. No hemorrhage or mass effect. 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 Chronic small-vessel ischemic changes elsewhere. CERVICAL SPINE CT: No  acute or traumatic finding. 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 Some motion degradation. Electronically Signed   By: Nelson Chimes M.D.   On: 03/11/2020 11:44   CT ANGIO NECK W OR WO CONTRAST  Addendum Date: 03/12/2020   ADDENDUM REPORT: 03/12/2020 09:31 ADDENDUM: Findings discussed with Dr. Evette Doffing at 9:30 a.m. via telephone. Electronically Signed   By: Margaretha Sheffield MD   On: 03/12/2020 09:31   Result Date: 03/12/2020 CLINICAL DATA:  Stroke/TIA.  Acute infarct in the right thalamus. EXAM: CT HEAD WITHOUT CONTRAST CT ANGIOGRAPHY OF THE HEAD AND NECK TECHNIQUE: Contiguous axial images were obtained from the base of the skull through the vertex without intravenous contrast. Multidetector CT imaging of the head and neck was performed using the standard protocol during bolus administration of intravenous contrast. Multiplanar CT image reconstructions and MIPs were obtained to evaluate the vascular anatomy. Carotid stenosis measurements (when applicable) are obtained utilizing NASCET criteria, using the distal internal carotid diameter as the denominator. CONTRAST:  22mL OMNIPAQUE IOHEXOL 350 MG/ML SOLN COMPARISON:  MRI March 11, 2020. FINDINGS: CT HEAD Brain: Hypodensity within the anterior right thalamus and parasagittal midbrain, compatible with acute infarct seen on recent MRI. Similar associated edema. No evidence of acute hemorrhage. No hydrocephalus. No mass lesion. Remote left parietal infarct. Generalized cerebral atrophy with ex vacuo ventricular dilation. Skull: No acute fracture. Sinuses/Orbits: No acute findings. CTA NECK Evaluation is significantly limited secondary to contrast bolus timing and streak artifact from pooled contrast within veins. Within this limitation: Aortic arch: Great vessel origins are poorly visualized given streak artifact from pooled venous contrast. Right carotid system: No evidence of hemodynamically significant stenosis or occlusion. Left carotid system: No evidence  hemodynamically significant stenosis or occlusion. Vertebral arteries:Nondiagnostic evaluation given venous contrast timing and streak artifact from surrounding veins in the upper chest and neck. Skeleton: No acute findings. Other neck: No mass or adenopathy. Upper chest: Right greater than left pleural effusions with interlobular septal thickening in the lung apices suggesting, suggesting interstitial edema. CTA HEAD Evaluation is limited by venous contrast bolus timing. Within this limitation: Anterior circulation: Possible hemodynamically significant stenosis of the right proximal cavernous carotid (series 11, image 112) versus artifact. Multifocal mild-to-moderate narrowing of bilateral M1 and M2 MCAs. No evidence of large vessel occlusion. Posterior circulation: Diminutive bilateral V4 vertebral arteries, poorly characterized given contrast timing. Possible severe stenosis of the distal basilar artery. Bilateral fetal like PCAs with irregular and diminutive bilateral PCAs. Possible hemodynamically significant stenosis of  the proximal right posterior communicating artery, poorly visualized. Venous sinuses: No evidence of dural sinus thrombosis. IMPRESSION: 1. Redemonstrated acute infarct within the anterior right thalamus, extending to the parasagittal right midbrain. Similar associated edema. 2. Evaluation is significantly limited given contrast bolus timing, particularly in the neck with nondiagnostic evaluation of the vertebral arteries within the neck. Although evaluation is significantly limited intracranially, there appears to be a severe stenosis of the distal basilar artery. The posterior cerebral arteries appear small bilaterally with multifocal narrowing and possible hemodynamically significant stenosis of the proximal right posterior communicating artery. 3. Possible proximal right cavernous internal carotid stenosis versus artifact. 4. A repeat CTA head/neck when the patient's renal function allows  could better characterize the above findings. 5. Right greater than left pleural effusions with interlobular septal thickening in the lung apices suggesting, suggesting interstitial edema. Electronically Signed: By: Margaretha Sheffield MD On: 03/12/2020 09:24   CT Cervical Spine Wo Contrast  Result Date: 03/11/2020 CLINICAL DATA:  Trauma to the head and neck. Abnormal mental status. EXAM: CT HEAD WITHOUT CONTRAST CT CERVICAL SPINE WITHOUT CONTRAST TECHNIQUE: Multidetector CT imaging of the head and cervical spine was performed following the standard protocol without intravenous contrast. Multiplanar CT image reconstructions of the cervical spine were also generated. COMPARISON:  None FINDINGS: CT HEAD FINDINGS Brain: Cerebellar atrophy with a few old small vessel cerebellar infarctions. Low-density in the right thalamus that could indicate acute or subacute right thalamic infarction. No hemorrhage. This extends back into right mid brain. Cerebral hemispheres elsewhere show chronic small-vessel ischemic change of the white matter. 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 No sign of mass lesion, hemorrhage, hydrocephalus or extra-axial collection. Vascular: There is atherosclerotic calcification of the major vessels at the base of the brain. Skull: Negative Sinuses/Orbits: Clear/normal Other: None CT CERVICAL SPINE FINDINGS Alignment: Normal Skull base and vertebrae: No fracture or primary bone lesion. Some motion degradation in the upper cervical region. Soft tissues and spinal canal: No sign of soft tissue injury. Disc levels: No significant degenerative disc disease is evident. 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 Upper chest: Mild pleural and parenchymal scarring at the apices. Other: None IMPRESSION: HEAD CT: Low-density in the right thalamus of indeterminate age but that could indicate acute or subacute right thalamic infarction. No hemorrhage or mass effect. Old left parietal cortical and  subcortical infarction. Chronic small-vessel ischemic changes elsewhere. CERVICAL SPINE CT: No acute or traumatic finding. 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 Some motion degradation. Electronically Signed   By: Nelson Chimes M.D.   On: 03/11/2020 11:44   MR BRAIN WO CONTRAST  Result Date: 03/11/2020 CLINICAL DATA:  Mental status change EXAM: MRI HEAD WITHOUT CONTRAST TECHNIQUE: Multiplanar, multiecho pulse sequences of the brain and surrounding structures were obtained without intravenous contrast. COMPARISON:  None. FINDINGS: DWI and axial T2 sequences were attempted. Motion degradation is present. Patient could not tolerate remainder of the study. There is reduced diffusion in the anterior right thalamus extending into the parasagittal right midbrain with corresponding T2 hyperintensity. Prominence of the ventricles and sulci reflects generalized parenchymal volume loss. There is no significant mass effect. Chronic left parietal infarct. Probable chronic microvascular ischemic changes. IMPRESSION: Partial study with motion degradation. 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 Electronically Signed   By: Macy Mis M.D.   On: 03/11/2020 19:10   US RENAL  Result Date: 03/12/2020 CLINICAL DATA:  Acute kidney injury EXAM: RENAL / URINARY TRACT ULTRASOUND COMPLETE COMPARISON:  None. FINDINGS: Right Kidney: Renal measurements: 10.3 x 5.7 x 6.1 cm = volume: 186.5 mL. Echogenicity within normal limits. Mild hydronephrosis. Subcentimeter interpolar cyst. Left Kidney: Renal measurements: 6.4 x 3.7 x 3.7 cm = volume: 45 mL. Echogenicity within normal limits. No mass or hydronephrosis visualized. Bladder: Distended.  Patient unable to void during exam. Other: Right pleural effusion. IMPRESSION: Mild right hydronephrosis. Distended bladder.  Patient unable to void during exam. Small left kidney. Right pleural effusion. Electronically Signed   By: Macy Mis M.D.   On: 03/12/2020 14:52   DG Humerus Left  Result Date: 03/11/2020 CLINICAL DATA:  Weakness. EXAM: LEFT HUMERUS - 2+ VIEW COMPARISON:  None. FINDINGS: There is no evidence of fracture or other focal bone lesions. Soft tissues are unremarkable. IMPRESSION: Negative. Electronically Signed   By: Kerby Moors M.D.   On: 03/11/2020 11:31   ECHOCARDIOGRAM COMPLETE  Result Date: 03/12/2020    ECHOCARDIOGRAM REPORT   Patient Name:   Christine Benitez Date of Exam: 03/12/2020 Medical Rec #:  [id number]       Height:       64.0 in Accession #:    [id number]      Weight:       117.7 lb Date of Birth:  Apr 18, 1961       BSA:          1.562 m Patient Age:    76 years        BP:           128/97 mmHg Patient Gender: F               HR:           104 bpm. Exam Location:  Inpatient Procedure: 2D Echo, Cardiac Doppler and Color Doppler Indications:    Stroke 434.91 / I163.9  History:        Patient has no prior history of Echocardiogram examinations. CHF                 and Cardiomyopathy, Pulmonary HTN; Risk Factors:Hypertension and                 Diabetes. Pericardial effusion.  Sonographer:    Vickie Epley RDCS Referring Phys: [id number] [place]  1. Left ventricular ejection fraction, by estimation, is 20 to 25%. The left ventricle has severely decreased function. There is severe left ventricular hypertrophy. Left ventricular diastolic parameters are indeterminate. The average left ventricular global longitudinal strain is -6.7 %. The global longitudinal strain is abnormal, with a pattern of relative apical sparing. SEE RECOMMENDATIONS.  2. Right ventricular systolic function is moderately reduced. The right ventricular size is mildly enlarged. Moderately increased right ventricular wall thickness. There is severely elevated pulmonary artery systolic pressure. The estimated right ventricular systolic pressure is 35.4 mmHg.  3. Left atrial size was moderately dilated.  4. Right atrial size was  mild to moderately dilated.  5. The mitral valve is grossly normal. Severe mitral valve regurgitation.  6. Tricuspid valve regurgitation is severe.  7. The aortic valve is grossly normal. Aortic valve regurgitation is trivial.  8. The inferior vena cava is dilated in size with <50% respiratory variability, suggesting right atrial pressure of 15 mmHg.  9. A small pericardial effusion is present. The pericardial effusion is posterior to the left ventricle. Conclusion(s)/Recommendation(s): No intracardiac source of embolism detected on this transthoracic study. A transesophageal echocardiogram is recommended to exclude cardiac source of embolism if clinically indicated. There  is severe LVH with right ventricular hypertrophy and a global longitudinal strain pattern with relative apical sparing. Findings strongly suggestive of cardiac amyloidosis. Recommend further evaluation. No definite LV apical thrombus, though Definity contrast was not given to fully exclude. FINDINGS  Left Ventricle: Left ventricular ejection fraction, by estimation, is 20 to 25%. The left ventricle has severely decreased function. The left ventricle demonstrates global hypokinesis. The average left ventricular global longitudinal strain is -6.7 %. The global longitudinal strain is abnormal. The left ventricular internal cavity size was normal in size. There is severe left ventricular hypertrophy. Left ventricular diastolic parameters are indeterminate. Right Ventricle: The right ventricular size is mildly enlarged. Moderately increased right ventricular wall thickness. Right ventricular systolic function is moderately reduced. There is severely elevated pulmonary artery systolic pressure. The tricuspid  regurgitant velocity is 3.99 m/s, and with an assumed right atrial pressure of 15 mmHg, the estimated right ventricular systolic pressure is 62.8 mmHg. Left Atrium: Left atrial size was moderately dilated. Right Atrium: Right atrial size was mild  to moderately dilated. Pericardium: A small pericardial effusion is present. The pericardial effusion is posterior to the left ventricle. Mitral Valve: The mitral valve is grossly normal. There is mild thickening of the mitral valve leaflet(s). There is mild calcification of the mitral valve leaflet(s). Severe mitral valve regurgitation. Tricuspid Valve: The tricuspid valve is grossly normal. Tricuspid valve regurgitation is severe. Aortic Valve: The aortic valve is grossly normal. Aortic valve regurgitation is trivial. Pulmonic Valve: The pulmonic valve was normal in structure. Pulmonic valve regurgitation is mild. Aorta: The aortic root is normal in size and structure. Venous: The inferior vena cava is dilated in size with less than 50% respiratory variability, suggesting right atrial pressure of 15 mmHg. The inferior vena cava and the hepatic vein show a pattern of systolic flow reversal, suggestive of tricuspid regurgitation. IAS/Shunts: No atrial level shunt detected by color flow Doppler.  LEFT VENTRICLE PLAX 2D LVIDd:         4.50 cm      Diastology LVIDs:         3.90 cm      LV e' medial:    5.43 cm/s LV PW:         1.70 cm      LV E/e' medial:  17.4 LV IVS:        1.60 cm      LV e' lateral:   6.00 cm/s LVOT diam:     1.70 cm      LV E/e' lateral: 15.7 LV SV:         19 LV SV Index:   12           2D Longitudinal Strain LVOT Area:     2.27 cm     2D Strain GLS Avg:     -6.7 %  LV Volumes (MOD) LV vol d, MOD A2C: 130.0 ml LV vol d, MOD A4C: 84.8 ml LV vol s, MOD A2C: 87.7 ml LV vol s, MOD A4C: 66.6 ml LV SV MOD A2C:     42.3 ml LV SV MOD A4C:     84.8 ml LV SV MOD BP:      28.0 ml RIGHT VENTRICLE RV S prime:     6.32 cm/s TAPSE (M-mode): 1.0 cm LEFT ATRIUM             Index       RIGHT ATRIUM           Index  LA diam:        4.40 cm 2.82 cm/m  RA Area:     19.70 cm LA Vol (A2C):   57.5 ml 36.82 ml/m RA Volume:   62.70 ml  40.15 ml/m LA Vol (A4C):   53.1 ml 34.00 ml/m LA Biplane Vol: 58.2 ml 37.26 ml/m   AORTIC VALVE LVOT Vmax:   71.90 cm/s LVOT Vmean:  46.000 cm/s LVOT VTI:    0.085 m  AORTA Ao Root diam: 2.50 cm MITRAL VALVE               TRICUSPID VALVE MV Area (PHT): 8.25 cm    TR Peak grad:   63.7 mmHg MV Decel Time: 92 msec     TR Vmax:        399.00 cm/s MR Peak grad: 85.0 mmHg MR Mean grad: 56.0 mmHg    SHUNTS MR Vmax:      461.00 cm/s  Systemic VTI:  0.08 m MR Vmean:     350.0 cm/s   Systemic Diam: 1.70 cm MV E velocity: 94.30 cm/s MV A velocity: 50.10 cm/s MV E/A ratio:  1.88 Cherlynn Kaiser MD Electronically signed by Cherlynn Kaiser MD Signature Date/Time: 03/12/2020/3:38:31 PM    Final    US Abdomen Limited RUQ (LIVER/GB)  Result Date: 03/11/2020 CLINICAL DATA:  Elevated bilirubin EXAM: ULTRASOUND ABDOMEN LIMITED RIGHT UPPER QUADRANT COMPARISON:  03/09/2020 FINDINGS: Gallbladder: Signs of cholelithiasis. Gallbladder wall thickening to 4-5 mm on today's study approximately 3-4 mm on the previous exam. No reported sonographic tenderness over the gallbladder. Cholelithiasis as on the previous exam. Common bile duct: Diameter: 2.3 mm Liver: No focal lesion in the liver on submitted images. Echotexture grossly normal. Portal vein is patent on color Doppler imaging with normal direction of blood flow towards the liver. Other: Echogenic RIGHT kidney and at least moderate RIGHT-sided pleural effusion. IMPRESSION: Findings remain equivocal for acute cholecystitis with gallbladder wall thickening and gallstones. Lack of reported tenderness over the gallbladder. Findings could be seen in generalized edematous state. Correlate with signs of heart failure in this patient with RIGHT-sided effusion. Echogenic RIGHT kidney could be seen in the setting of renal dysfunction/medical renal disease incidentally imaged and incompletely assessed on the current exam. IVC remains prominent as do hepatic veins raising the question congestive hepatopathy. Electronically Signed   By: Zetta Bills M.D.   On: 03/11/2020 16:19     Labs:  CBC: Recent Labs    03/11/20 1031 03/11/20 1320 03/12/20 0500 03/13/20 0137  WBC 7.2  --  6.7 6.5  HGB 16.1* 17.7* 13.9 13.3  HCT 51.2* 52.0* 43.3 40.7  PLT 251  --  203 227    COAGS: Recent Labs    03/11/20 1200 03/12/20 0500  INR 1.2 1.4*    BMP: Recent Labs    03/11/20 1320 03/11/20 1435 03/12/20 0500 03/13/20 0137  NA 137 138 137 138  K 3.9 3.4* 4.2 3.6  CL  --  95* 96* 99  CO2  --  23 20* 21*  GLUCOSE  --  322* 289* 139*  BUN  --  21* 27* 30*  CALCIUM  --  8.4* 7.9* 8.3*  CREATININE  --  1.43* 1.67* 1.65*  GFRNONAA  --  43* 35* 36*    LIVER FUNCTION TESTS: Recent Labs    03/11/20 1435 03/12/20 0500 03/13/20 0137  BILITOT 3.4* 2.9*  --   AST 27 35  --   ALT 16 21  --   ALKPHOS  131* 110  --   PROT 6.8 6.2*  --   ALBUMIN 3.1* 3.0* 2.9*    TUMOR MARKERS: No results for input(s): AFPTM, CEA, CA199, CHROMGRNA in the last 8760 hours.  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 large vessel disease source Basilar artery stenosis Basilar artery stenosis suggested on CT Head and Neck.  IR consulted for diagnostic angiogram.  Patient is alert and cooperative but not fully oriented. Mother at bedside assists with assessment, history, and provides consent.   NPO p MN tonight.  Note elevated baseline SCr-- 1.67 today Getting gentle hydration of NS at 50 mL/hr.   Risks and benefits were discussed with the patient including, but not limited to bleeding, infection, vascular injury or contrast induced renal failure.  This interventional procedure involves the use of X-rays and because of the nature of the planned procedure, it is possible that we will have prolonged use of X-ray fluoroscopy.  Potential radiation risks to you include (but are not limited to) the following: - A slightly elevated risk for cancer  several years later in life. This risk is typically less than 0.5% percent. This  risk is low in comparison to the normal incidence of human cancer, which is 33% for women and 50% for men according to the [place]. - Radiation induced injury can include skin redness, resembling a rash, tissue breakdown / ulcers and hair loss (which can be temporary or permanent).   The likelihood of either of these occurring depends on the difficulty of the procedure and whether you are sensitive to radiation due to previous procedures, disease, or genetic conditions.   IF your procedure requires a prolonged use of radiation, you will be notified and given written instructions for further action.  It is your responsibility to monitor the irradiated area for the 2 weeks following the procedure and to notify your physician if you are concerned that you have suffered a radiation induced injury.    All of the patient's questions were answered, patient is agreeable to proceed.  Consent signed and in chart.  Thank you for this interesting consult.  I greatly enjoyed meeting TESNEEM DUFRANE and look forward to participating in their care.  A copy of this report was sent to the requesting provider on this date.  Electronically Signed: Docia Barrier, PA 03/13/2020, 2:37 PM   I spent a total of 40 Minutes    in face to face in clinical consultation, greater than 50% of which was counseling/coordinating care for basilar artery stenosis.

## 2020-03-13 NOTE — Progress Notes (Signed)
STROKE TEAM PROGRESS NOTE   INTERVAL HISTORY Mom at bedside. Pt is sleeping initially but later arouse and talking with mom. Still has hard of hearing but no neuro changes. IR consulted and plan for cerebral angiogram tomorrow. Cre still at 1.65.   Vitals:   03/13/20 0554 03/13/20 0731 03/13/20 1140 03/13/20 1610  BP:  115/83 (!) 129/96 115/86  Pulse:  95 97 96  Resp:  16 16 18   Temp:  (!) 97.4 F (36.3 C) 97.9 F (36.6 C) 97.6 F (36.4 C)  TempSrc:  Oral Oral Oral  SpO2:  97% 98% 100%  Weight: 54.8 kg     Height:       CBC:  Recent Labs  Lab 03/11/20 1031 03/11/20 1320 03/12/20 0500 03/13/20 0137  WBC 7.2   < > 6.7 6.5  NEUTROABS 5.6  --   --   --   HGB 16.1*   < > 13.9 13.3  HCT 51.2*   < > 43.3 40.7  MCV 98.1   < > 95.6 94.9  PLT 251   < > 203 227   < > = values in this interval not displayed.   Basic Metabolic Panel:  Recent Labs  Lab 03/12/20 0500 03/13/20 0137  NA 137 138  K 4.2 3.6  CL 96* 99  CO2 20* 21*  GLUCOSE 289* 139*  BUN 27* 30*  CREATININE 1.67* 1.65*  CALCIUM 7.9* 8.3*  PHOS  --  3.4   Lipid Panel:  Recent Labs  Lab 03/12/20 0500  CHOL 80  TRIG 73  HDL 24*  CHOLHDL 3.3  VLDL 15  LDLCALC 41   HgbA1c:  Recent Labs  Lab 03/12/20 0645  HGBA1C 7.3*   Urine Drug Screen:  Recent Labs  Lab 03/11/20 1031  LABOPIA NONE DETECTED  COCAINSCRNUR NONE DETECTED  LABBENZ NONE DETECTED  AMPHETMU NONE DETECTED  THCU NONE DETECTED  LABBARB NONE DETECTED    Alcohol Level  Recent Labs  Lab 03/11/20 1329  ETH <10    IMAGING past 24 hours No results found.  PHYSICAL EXAM  Temp:  [97.4 F (36.3 C)-98.8 F (37.1 C)] 97.6 F (36.4 C) (11/04 1610) Pulse Rate:  [94-108] 96 (11/04 1610) Resp:  [16-20] 18 (11/04 1610) BP: (115-131)/(67-96) 115/86 (11/04 1610) SpO2:  [91 %-100 %] 100 % (11/04 1610) Weight:  [54.8 kg] 54.8 kg (11/04 0554)  General - Well nourished, well developed, lethargic.  Ophthalmologic - fundi not visualized  due to noncooperation.  Cardiovascular - Regular rhythm and rate.  Neuro - lethargic, eyes closed, but open on voice.  Severe hard of hearing, orientated to months, age and self, not orientated to place and year.  Intermittent speaking with words and sentences, no aphasia, however paucity of speech, able to name 2/3, able to repeat with dysarthric voice.  Able to follow simple commands.  Blinking to visual threat bilaterally.  Extraocular movement impaired, left eye adduction incomplete, otherwise no significant extraocular movement on request.  Left pupil 2 mm, right pupil 3.5 mm, both reactive to light.  Left facial droop, left palpebral fissure enlarged than right.  Severe hard of hearing.  Tongue midline.  Left upper extremity flaccid.  Left lower extremity 3 -/5.  Right upper and lower extremity at least 4/5.  Subjectively decreased sensation on the left, however screaming to pain on painful stimulation.  Finger-to-nose intact on the right.  Gait not tested.   ASSESSMENT/PLAN [name] is a 59 y.o. female with history  of of CHF, HTN, IDDM, iron deficiency anemia, NICM, CAD, pulmonary hypertension and smoking found down, presenting with lethargy and L sided weakness .   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 large vessel disease source.  Cardioembolic source cannot be completely excluded due to low EF.  CT head R thalamic hypodensity. Old L parietal cortial and subcortical infarct.   MRI  Anterior R thalamic into parasagittal R midbrain infarct   CTA head & neck Limited eval d/t bolus timing - severe distal BA stenosis, B PCA narrowing and possible proximal R PComA significant stenosis, possible R cavernous ICA stenosis. R>L pleural effusions.    Plan for cerebral angiogram tomorrow to evaluate basilar artery stenosis  2D Echo EF 20 to 25%.  LA moderately dilated.  LDL 41  HgbA1c 7.3  VTE prophylaxis - Lovenox 40 mg sq daily    aspirin 81 mg daily prior to admission, now on aspirin 325 mg daily and clopidogrel 75 mg daily.   Therapy recommendations:  SNF  Disposition:  pending   Basilar artery stenosis  CTA head and neck showed severe distal basilar artery stenosis  Cerebral angiogram tomorrow planned.  On DAPT  Cardiomyopathy CHF  TTE showed EF 20 to 25%  Home meds including Coreg, Lasix and enalapril  Resume home meds gradually if BP allows  Consider cardiac event monitoring as outpatient to rule out A. fib  Hypertension  Stable on the low side . Home meds including Coreg, Lasix and enalapril . Consider gradually resume home meds if BP allows . Long-term BP goal normotensive  Hyperlipidemia  Home meds:  lipitor 20  Now on lipitor 40  LDL 41, goal < 70  Continue statin at discharge  Diabetes type II Uncontrolled  HgbA1c 7.3, goal < 7.0  SSI  CBG monitoring  Hyperglycemia  On Lantus  Close PCP follow-up for better DM control  AKI on CKD  Creatinine 1.43-1.67->1.65   Gentle hydration - NS at 50 cc / hr started 11/3  Renal function monitoring   Other Stroke Risk Factors  Former Cigarette smoker, quit 4 yrs ago  Family hx stroke (mother)  Other Active Problems  Pulmonary HTN  Chronic cholecystitis w/ calculus  Hospital day # 2  Marvel Plan, MD PhD Stroke Neurology 03/13/2020 8:48 PM   To contact Stroke Continuity provider, please refer to [email or web address]. After hours, contact General Neurology

## 2020-03-13 NOTE — Evaluation (Signed)
Clinical/Bedside Swallow Evaluation Patient Details  Name: Christine Benitez MRN: [id number] Date of Birth: [birth year]  Today's Date: 03/13/2020 Time: SLP Start Time (ACUTE ONLY): 1110 SLP Stop Time (ACUTE ONLY): 1130 SLP Time Calculation (min) (ACUTE ONLY): 20 min  Past Medical History:  Past Medical History:  Diagnosis Date  . Abnormal LFTs    a. 05/2014 -  ALT 52, alk phos 130.  Marland Kitchen Chronic systolic CHF (congestive heart failure) ([place])    a. Dx 05/2014 - echo at [facility] - EF 30-35%, moderate LVH, no rWMA, mild to moderate MR, moderate PH with PA pressure 60-22mHg, mild to moderate pericardial effusion. EF 30-35% by cath.  . History of blood transfusion 1986   "related to C-section"  . Hypertension   . Insulin dependent diabetes mellitus   . Iron deficiency anemia   . [place]    [place]:  minor nonobstructive CAD with mild irregularities in the LAD less than 10-20%, LCx with mild disease less than 20%, no significant disease in the RCA, EF 30-35%.  . Pericardial effusion    a. [facility]  . [place]    [facility]  . Tobacco abuse    Past Surgical History:  Past Surgical History:  Procedure Laterality Date  . APPENDECTOMY  ~ 2003  . CESAREAN SECTION  1986  . LEFT HEART CATHETERIZATION WITH CORONARY ANGIOGRAM N/A 06/11/2014   Procedure: LEFT HEART CATHETERIZATION WITH CORONARY ANGIOGRAM;  Surgeon: Peter M JMartinique MD;  [facility];  Service: Cardiovascular;  Laterality: N/A;   HPI:  59year old person living with chronic nonischemic cardiomyopathy with reduced ejection fraction and insulin-dependent diabetes presented to the emergency department on 03/11/20 with acute encephalopathy. Pt is a poor historian and family was not present during BSE.  Decreased alertness level noted with HOH status exacerbating cognitive deficits; MRI 03/11/20 indicated Acute  infarction of the anterior right thalamus extending into the parasagittal right midbrain.  Assessment / Plan / Recommendation Clinical Impression  Pt seen for BSE with mod verbal/visual cues provided d/t HOH status and impaired cognition.  Pt given various consistencies including: thin via straw, puree and soft solids with impaired mastication noted with soft solids resulting in oral residue and required liquid wash and min verbal cues to clear oral cavity.  Recommend Dysphagia 2/thin liquid diet with assistance required with feeding and reminders for swallowing precautions.  Pt is at mild-mod risk for aspiration without precautions in place d/t cognitive deficits and hx of pocketing.  Meds crushed in puree and clearance of oral cavity post-PO intake.  Thank you for this consult. SLP Visit Diagnosis: Dysphagia, oral phase (R13.11)    Aspiration Risk  Mild aspiration risk    Diet Recommendation   Dysphagia 2 (chopped)/thin liquids  Medication Administration: Crushed with puree    Other  Recommendations Oral Care Recommendations: Oral care BID   Follow up Recommendations Skilled Nursing facility      Frequency and Duration min 2x/week  1 week       Prognosis Prognosis for Safe Diet Advancement: Good Barriers to Reach Goals: Cognitive deficits      Swallow Study   General Date of Onset: 03/11/20 HPI: 59year old person living with chronic nonischemic cardiomyopathy with reduced ejection fraction and insulin-dependent diabetes presented to the emergency department on 03/11/20 with acute encephalopathy and admitted with an acute cerebral infarction of the anterior right thalamus extending into the  right midbrain. Pt is a poor historian and family was not present during BSE.  Decreased alertness level noted with HOH status exacerbating cognitive deficits. Type of Study: Bedside Swallow Evaluation Previous Swallow Assessment: [place] passed 03/11/20 Diet Prior to this Study: Dysphagia 2  (chopped);Thin liquids Temperature Spikes Noted: No Respiratory Status: Room air History of Recent Intubation: No Behavior/Cognition: Cooperative;Lethargic/Drowsy;Requires cueing Oral Cavity Assessment: Within Functional Limits Oral Care Completed by SLP: Recent completion by staff Oral Cavity - Dentition: Edentulous Self-Feeding Abilities: Needs assist Patient Positioning: Upright in bed Baseline Vocal Quality: Normal Volitional Cough: Strong Volitional Swallow: Able to elicit    Oral/Motor/Sensory Function Overall Oral Motor/Sensory Function: Mild impairment Facial ROM: Reduced right Facial Symmetry: Abnormal symmetry right Facial Strength: Reduced right   Ice Chips Ice chips: Not tested   Thin Liquid Thin Liquid: Within functional limits Presentation: Straw    Nectar Thick Nectar Thick Liquid: Not tested   Honey Thick Honey Thick Liquid: Not tested   Puree Puree: Within functional limits Presentation: Spoon   Solid     Solid: Impaired Presentation: Spoon Oral Phase Impairments: Impaired mastication Oral Phase Functional Implications: Oral residue      Elvina Sidle, M.S., CCC-SLP 03/13/2020,12:00 PM

## 2020-03-13 NOTE — Progress Notes (Addendum)
   Subjective:  Patient evaluated at bedside this AM. More awake and interactive. She notes that she is at [facility] and in [place]. Notes she fell and hit head but unable to give much more history. Discussed that she is admitted for stroke She denies any personal or family history of stroke. Tearful on examination.  Objective:  Vital signs in last 24 hours: Vitals:   03/12/20 2330 03/13/20 0000 03/13/20 0459 03/13/20 0554  BP: (!) 130/92 124/67 122/88   Pulse: 98 95 94   Resp: 17  20   Temp: (!) 97.5 F (36.4 C) 98 F (36.7 C) 98.8 F (37.1 C)   TempSrc:  Oral Oral   SpO2: 99% 96% 95%   Weight:    54.8 kg  Height:       Physical Exam: General: Pleasant, no acute distress MSK: Hnds, feet cold to touch Neuro: Somnolent but arousable. Disoriented.  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  Principal Problem:   Acute cerebral ischemia Active Problems:   Chronic HFrEF (heart failure with reduced ejection fraction) (HCC)   Diabetes mellitus (HCC)   Hypertension   Pulmonary hypertension (HCC)   Chronic cholecystitis with calculus   Cerebral thrombosis with cerebral infarction  #Acute anterior right thalamic infarct #Acute Metabolic Encephalopathy Patient continues to be disoriented, although slightly improved from yesterday. Discussed CTA results with neurology, likely poor visualization 2/2 poor systolic function. Per neuro will consider angiogram for evaluation of basilar artery stenosis on Friday. Will continue monitoring patient and giving DAPT. - LDL 41, within goal - A1c 7.3 - ASA 325mg , Plavix - TEE to rule out cardioembolic source, probably at earliest would be Monday - Tele - PT/OT, SLP  #Chronic systolic heart failure Patient today mentioned she has not previously seen cardiologist. TTE  yesterday showed severely reduced EF 20-25%, left ventricular global longitudinal strain with apical sparing, enlarged RV, LA, RA within severe MR, TR and small pericardial effusion. Findings could be consistent with cardiac amyloidosis.  - F/u SPEP, serum immunofixation, serum light chains to eval for AL amyloidosis - PYP scan to eval for TTR amyloidosis - Hold home coreg, furosemide, enalapril  #Acute Kidney Injury #Urinary retention Unknown baseline Cr. This AM Cr 1.67 < 1.65. Renal ultrasound obtained yesterday revealed right hydronephrosis and distended bladder. O/N bladder scan >1032mL, patient had 80m UOP with I&O cath. Will put in Foley today, likely urinary retention due to acute thalamic stroke. High risk for bladder stretch injury - Bladder decompression with Foley - Would leave catheter in place for at least 2 weeks to allow the bladder to heal, then voiding trial as an outpatient  #Chronic cholecystitis Patient remains afebrile, no leukocytosis, no complaints of abdominal pain. Will likely benefit from HIDA, further evaluation after work-up of acute thalamic infarct. - Monitor CMP - Can consider outpatient HIDA  #Type 2 Diabetes Mellitus A1c 7.3 on admission. CBG's appropriate, 100-130. Will continue with current regimen. - C/w SSI, Lantus 10  DIET: Renal IVF: n/a DVT PPX: Lovenox BOWEL: Miralax CODE: FULL  Prior to Admission Living Arrangement: Home Anticipated Discharge Location: SNF per PT/OT Barriers to Discharge: mental status, stroke/cardiac work-up Dispo: Anticipated discharge in approximately 3-4 day(s).   , MD 03/13/2020, 6:14 AM Pager: [phone] After 5pm on weekdays and 1pm on weekends: On Call pager [phone]

## 2020-03-13 NOTE — Progress Notes (Signed)
Physical Therapy Treatment Patient Details Name: Christine Benitez MRN: [id number] DOB: November 20, 1960 Today's Date: 03/13/2020    History of Present Illness Pt is a 59 y/o female admitted secondary to AMS and fall. 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 PMH includes CHF, HTN, DM, CAD, and pulm HTN.     PT Comments    Pt is demonstrating progress towards her goals as she only required minAx1 to transition supine to sit EOB and transfer STS. However, she would benefit from +2 personnel to facilitate gait safely as she fatigues quickly and requires increased assistance from minA to [facility] as distance progresses. She demonstrates unsteadiness and weakness at her L knee, preventing her from maintaining L stance time and from advancing her leg during swing appropriately, resulting in her requiring tactile cues and assistance to maintain safety. Pt lacks some safety awareness, placing her at risk for falls. Will continue to follow acutely and recommend SNF upon d/c to address her deficits and thus maximize her independence and safety with all functional mobility.   Follow Up Recommendations  SNF;Supervision/Assistance - 24 hour     Equipment Recommendations  Other (comment) (TBD)    Recommendations for Other Services       Precautions / Restrictions Precautions Precautions: Fall Precaution Comments: foley catheter; HOH Restrictions Weight Bearing Restrictions: No    Mobility  Bed Mobility Overal bed mobility: Needs Assistance Bed Mobility: Supine to Sit     Supine to sit: Min assist     General bed mobility comments: Pt able to transition LEs towards EOB when cued, but required assistance to bring off EOB. Hand-over-hand cues for R hand placement on R bed rail to assist with pulling trunk superiorly. MinA at trunk and to square hips with EOB.   Transfers Overall transfer level: Needs assistance Equipment used: Rolling walker (2 wheeled) Transfers: Sit to/from Stand Sit to Stand: Min assist          General transfer comment: Repeated cues for proper hand placement prior to transfers. Unsteadiness noted and extra time required to power up to stand.  Ambulation/Gait Ambulation/Gait assistance: Min assist (intermittent modA) Gait Distance (Feet): 5 Feet Assistive device: Rolling walker (2 wheeled) Gait Pattern/deviations: Step-through pattern;Decreased stride length;Decreased stance time - left;Decreased step length - left;Shuffle;Trunk flexed;Narrow base of support Gait velocity: decreased Gait velocity interpretation: <1.31 ft/sec, indicative of household ambulator General Gait Details: Provided VCs and TCs at hips and L knee and hamstring to assist with increasing stance time bilaterally and to advance L LE during swing and prevent genu recurvatum during stance. Pt fatigues easily and progresses from requiring minA to requiring modA, with moments of LOB.   Stairs             Wheelchair Mobility    Modified Rankin (Stroke Patients Only) Modified Rankin (Stroke Patients Only) Pre-Morbid Rankin Score: No significant disability Modified Rankin: Moderately severe disability     Balance Overall balance assessment: Needs assistance Sitting-balance support: Bilateral upper extremity supported;Feet supported Sitting balance-Leahy Scale: Poor Sitting balance - Comments: UE support on bed to maintain static sitting balance, with min guard for safety.   Standing balance support: Bilateral upper extremity supported;During functional activity Standing balance-Leahy Scale: Poor Standing balance comment: B UE support on RW and min-modA to maintain balance with mobility due to trunk sway and unsteadiness in L knee.  Cognition Arousal/Alertness: Awake/alert Behavior During Therapy: Flat affect Overall Cognitive Status: No family/caregiver present to determine baseline cognitive functioning Area of Impairment: Attention;Following  commands;Safety/judgement;Awareness;Problem solving                   Current Attention Level: Sustained   Following Commands: Follows one step commands inconsistently;Follows one step commands with increased time Safety/Judgement: Decreased awareness of safety;Decreased awareness of deficits Awareness: Intellectual Problem Solving: Slow processing;Decreased initiation;Difficulty sequencing;Requires verbal cues;Requires tactile cues General Comments: Required repeated cues for hand placement with transfers and step-by-step cues to sequence and problem-solve how to transition supine to sit EOB.       Exercises      General Comments        Pertinent Vitals/Pain Pain Assessment: No/denies pain Pain Intervention(s): Limited activity within patient's tolerance;Monitored during session    Home Living                      Prior Function            PT Goals (current goals can now be found in the care plan section) Acute Rehab PT Goals Patient Stated Goal: to sit up PT Goal Formulation: With patient Time For Goal Achievement: 03/26/20 Potential to Achieve Goals: Fair Progress towards PT goals: Progressing toward goals    Frequency    Min 3X/week      PT Plan Current plan remains appropriate    Co-evaluation              AM-PAC PT "6 Clicks" Mobility   Outcome Measure  Help needed turning from your back to your side while in a flat bed without using bedrails?: A Little Help needed moving from lying on your back to sitting on the side of a flat bed without using bedrails?: A Little Help needed moving to and from a bed to a chair (including a wheelchair)?: A Little Help needed standing up from a chair using your arms (e.g., wheelchair or bedside chair)?: A Little Help needed to walk in hospital room?: A Little Help needed climbing 3-5 steps with a railing? : A Lot 6 Click Score: 17    End of Session Equipment Utilized During Treatment: Gait  belt Activity Tolerance: Patient tolerated treatment well;Patient limited by fatigue Patient left: in chair;with call bell/phone within reach;with chair alarm set Nurse Communication: Mobility status;Precautions PT Visit Diagnosis: Unsteadiness on feet (R26.81);Muscle weakness (generalized) (M62.81);Other symptoms and signs involving the nervous system (R29.898);Other abnormalities of gait and mobility (R26.89);Difficulty in walking, not elsewhere classified (R26.2)     Time: 4081-4481 PT Time Calculation (min) (ACUTE ONLY): 16 min  Charges:  $Gait Training: 8-22 mins                     Raymond Gurney, PT, DPT Acute Rehabilitation Services  Pager: [phone] Office: [phone]    Jewel Baize 03/13/2020, 3:57 PM

## 2020-03-14 ENCOUNTER — Inpatient Hospital Stay (HOSPITAL_COMMUNITY): Payer: BC Managed Care – PPO

## 2020-03-14 DIAGNOSIS — R4182 Altered mental status, unspecified: Secondary | ICD-10-CM | POA: Diagnosis not present

## 2020-03-14 DIAGNOSIS — I6330 Cerebral infarction due to thrombosis of unspecified cerebral artery: Principal | ICD-10-CM

## 2020-03-14 DIAGNOSIS — I5021 Acute systolic (congestive) heart failure: Secondary | ICD-10-CM

## 2020-03-14 DIAGNOSIS — R570 Cardiogenic shock: Secondary | ICD-10-CM | POA: Diagnosis not present

## 2020-03-14 DIAGNOSIS — I5023 Acute on chronic systolic (congestive) heart failure: Secondary | ICD-10-CM

## 2020-03-14 DIAGNOSIS — I6782 Cerebral ischemia: Secondary | ICD-10-CM | POA: Diagnosis not present

## 2020-03-14 DIAGNOSIS — N179 Acute kidney failure, unspecified: Secondary | ICD-10-CM | POA: Diagnosis not present

## 2020-03-14 LAB — BASIC METABOLIC PANEL
Anion gap: 16 — ABNORMAL HIGH (ref 5–15)
Anion gap: 14 (ref 5–15)
BUN: 34 mg/dL — ABNORMAL HIGH (ref 6–20)
BUN: 35 mg/dL — ABNORMAL HIGH (ref 6–20)
CO2: 20 mmol/L — ABNORMAL LOW (ref 22–32)
CO2: 25 mmol/L (ref 22–32)
Calcium: 8.4 mg/dL — ABNORMAL LOW (ref 8.9–10.3)
Calcium: 8.4 mg/dL — ABNORMAL LOW (ref 8.9–10.3)
Chloride: 105 mmol/L (ref 98–111)
Chloride: 100 mmol/L (ref 98–111)
Creatinine, Ser: 1.89 mg/dL — ABNORMAL HIGH (ref 0.44–1.00)
Creatinine, Ser: 1.76 mg/dL — ABNORMAL HIGH (ref 0.44–1.00)
GFR, Estimated: 30 mL/min — ABNORMAL LOW (ref >60)
GFR, Estimated: 33 mL/min — ABNORMAL LOW (ref >60)
Glucose, Bld: 60 mg/dL — ABNORMAL LOW (ref 70–99)
Glucose, Bld: 175 mg/dL — ABNORMAL HIGH (ref 70–99)
Potassium: 3.6 mmol/L (ref 3.5–5.1)
Potassium: 3.2 mmol/L — ABNORMAL LOW (ref 3.5–5.1)
Sodium: 141 mmol/L (ref 135–145)
Sodium: 139 mmol/L (ref 135–145)

## 2020-03-14 LAB — COOXEMETRY PANEL
Carboxyhemoglobin: 1 % (ref 0.5–1.5)
Carboxyhemoglobin: 1.1 % (ref 0.5–1.5)
Methemoglobin: 0.7 % (ref 0.0–1.5)
Methemoglobin: 0.6 % (ref 0.0–1.5)
O2 Saturation: 45.4 %
O2 Saturation: 60.2 %
Total hemoglobin: 13.5 g/dL (ref 12.0–16.0)
Total hemoglobin: 13.3 g/dL (ref 12.0–16.0)

## 2020-03-14 LAB — BLOOD GAS, VENOUS
Acid-base deficit: 2.7 mmol/L — ABNORMAL HIGH (ref 0.0–2.0)
Bicarbonate: 21.6 mmol/L (ref 20.0–28.0)
Drawn by: 6286
FIO2: 21
O2 Saturation: 81.1 %
Patient temperature: 36.4
pCO2, Ven: 36.2 mmHg — ABNORMAL LOW (ref 44.0–60.0)
pH, Ven: 7.39 (ref 7.250–7.430)
pO2, Ven: 49.8 mmHg — ABNORMAL HIGH (ref 32.0–45.0)

## 2020-03-14 LAB — GLUCOSE, CAPILLARY
Glucose-Capillary: 63 mg/dL — ABNORMAL LOW (ref 70–99)
Glucose-Capillary: 138 mg/dL — ABNORMAL HIGH (ref 70–99)
Glucose-Capillary: 88 mg/dL (ref 70–99)
Glucose-Capillary: 66 mg/dL — ABNORMAL LOW (ref 70–99)
Glucose-Capillary: 75 mg/dL (ref 70–99)
Glucose-Capillary: 197 mg/dL — ABNORMAL HIGH (ref 70–99)

## 2020-03-14 LAB — LACTIC ACID, PLASMA
Lactic Acid, Venous: 4.9 mmol/L (ref 0.5–1.9)
Lactic Acid, Venous: 4.4 mmol/L (ref 0.5–1.9)
Lactic Acid, Venous: 2.4 mmol/L (ref 0.5–1.9)

## 2020-03-14 LAB — KAPPA/LAMBDA LIGHT CHAINS
Kappa free light chain: 35.4 mg/L — ABNORMAL HIGH (ref 3.3–19.4)
Kappa, lambda light chain ratio: 1.34 (ref 0.26–1.65)
Lambda free light chains: 26.5 mg/L — ABNORMAL HIGH (ref 5.7–26.3)

## 2020-03-14 LAB — PROTEIN ELECTROPHORESIS, SERUM
A/G Ratio: 1 (ref 0.7–1.7)
Albumin ELP: 3.2 g/dL (ref 2.9–4.4)
Alpha-1-Globulin: 0.3 g/dL (ref 0.0–0.4)
Alpha-2-Globulin: 0.8 g/dL (ref 0.4–1.0)
Beta Globulin: 1 g/dL (ref 0.7–1.3)
Gamma Globulin: 1.3 g/dL (ref 0.4–1.8)
Globulin, Total: 3.3 g/dL (ref 2.2–3.9)
Total Protein ELP: 6.5 g/dL (ref 6.0–8.5)

## 2020-03-14 IMAGING — DX DG CHEST 1V
1 series · 1 of 1 positions shown · non-contrast
Comparison: Radiograph [DATE]

CLINICAL DATA: Central line placement.

EXAM:
CHEST  1 VIEW

[chest ap]
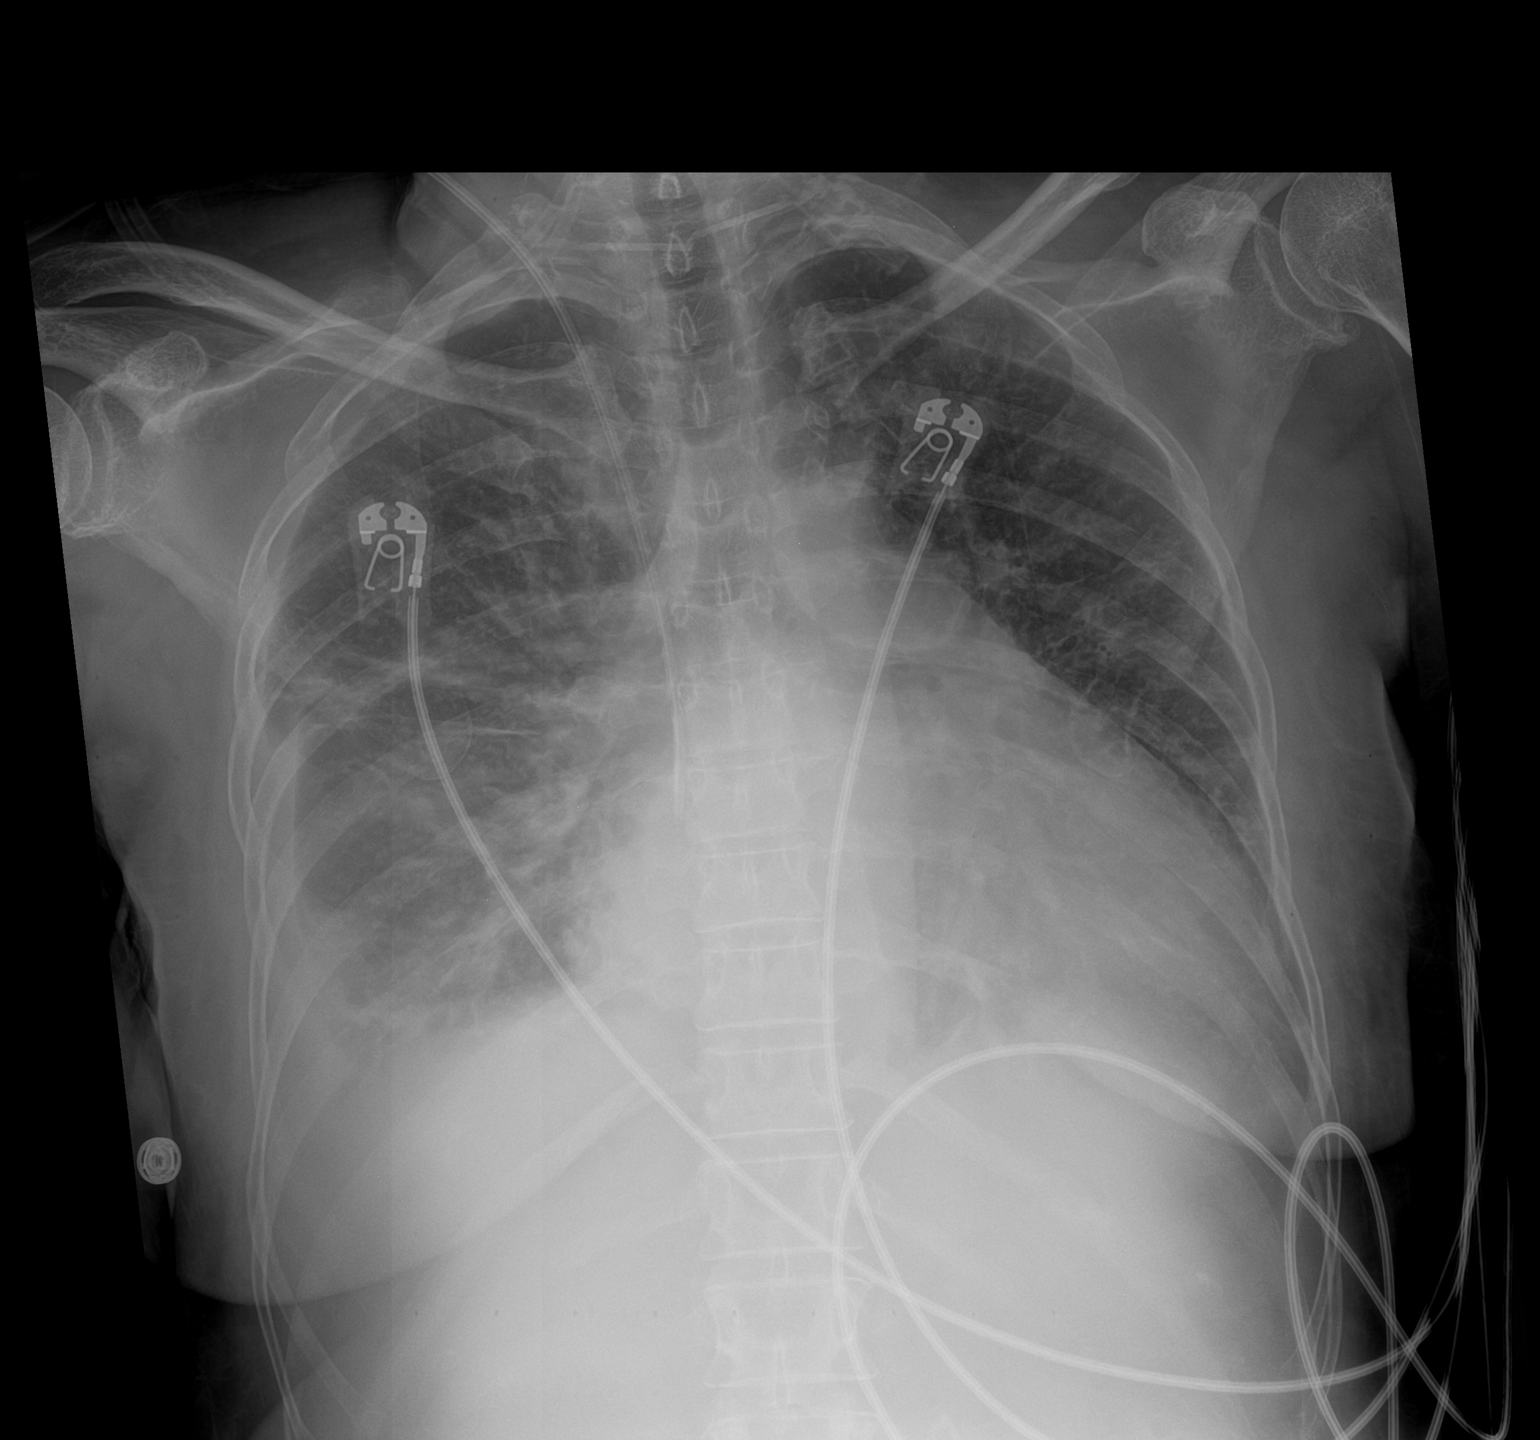

[1 of 1 positions shown; findings below may reference images not displayed]

FINDINGS: Tip of the right internal jugular central venous catheter in the
region of the atrial caval junction. No pneumothorax. Right greater
than left pleural effusions and adjacent compressive atelectasis.
Slight increase in pleural fluid since prior exam. Cardiomegaly is
not significantly changed. Interstitial opacities typical of
pulmonary edema. Streaky atelectasis in the right mid lung. No acute
osseous abnormalities are seen.
IMPRESSION: 1. Tip of the right internal jugular central venous catheter in the
region of the atrial caval junction. No pneumothorax.
2. Right greater than left pleural effusions with adjacent
compressive atelectasis, increased from prior. Cardiomegaly is
unchanged. Pulmonary edema.

## 2020-03-14 IMAGING — NM NM SCAN TUMOR LOCALIZE WITH SPECT
1 series · 1 of 1 positions shown · non-contrast
Comparison: none

CLINICAL DATA: HEART FAILURE. CONCERN FOR CARDIAC AMYLOIDOSIS.

EXAM:
NUCLEAR MEDICINE TUMOR LOCALIZATION. PYP CARDIAC AMYLOIDOSIS SCAN
WITH SPECT
TECHNIQUE: Following intravenous administration of radiopharmaceutical,
anterior planar images of the chest were obtained. Regions of
interest were placed on the heart and contralateral chest wall for
quantitative assessment. Additional SPECT imaging of the chest was
not obtained; patient was unable to tolerate being with the camera
and holding still.
RADIOPHARMACEUTICALS:  19.8 mCi TECHNETIUM 99 PYROPHOSPHATE

[Series 1: ant-post · 4.14mm/px · 1 of 1 slices shown]
[im 1/1]
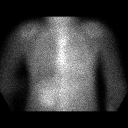

[1 of 1 positions shown; findings below may reference images not displayed]

FINDINGS: Planar Visual assessment:

Anterior planar imaging demonstrates radiotracer uptake within the
heart greater than uptake within the adjacent ribs (Grade 2).

Quantitative assessment :

Quantitative assessment of the cardiac uptake compared to the
contralateral chest wall is equal to 1.45 (H/CL = 1.45) .

SPECT assessment: Not performed
IMPRESSION: Visual and quantitative assessment (grade 2, H/CL = 1.45) are
strongly suggestive of transthyretin amyloidosis.

## 2020-03-14 MED ORDER — MILRINONE LACTATE IN DEXTROSE 20-5 MG/100ML-% IV SOLN
0.2500 ug/kg/min | INTRAVENOUS | Status: DC
  Administered 2020-03-14: 0.25 ug/kg/min via INTRAVENOUS
  Filled 2020-03-14 (×3): qty 100

## 2020-03-14 MED ORDER — TECHNETIUM TC 99M PYROPHOSPHATE
19.8000 | Freq: Once | INTRAVENOUS | Status: AC | PRN
  Administered 2020-03-14: 19.8 via INTRAVENOUS
  Filled 2020-03-14: qty 20

## 2020-03-14 MED ORDER — DEXTROSE 50 % IV SOLN
12.5000 g | INTRAVENOUS | Status: AC
  Administered 2020-03-14: 12.5 g via INTRAVENOUS
  Filled 2020-03-14: qty 50

## 2020-03-14 MED ORDER — FUROSEMIDE 10 MG/ML IJ SOLN
80.0000 mg | Freq: Once | INTRAMUSCULAR | Status: AC
  Administered 2020-03-14: 80 mg via INTRAVENOUS
  Filled 2020-03-14: qty 8

## 2020-03-14 MED ORDER — POTASSIUM CHLORIDE 10 MEQ/50ML IV SOLN
10.0000 meq | INTRAVENOUS | Status: AC
  Administered 2020-03-14 – 2020-03-15 (×4): 10 meq via INTRAVENOUS
  Filled 2020-03-14 (×4): qty 50

## 2020-03-14 MED ORDER — GLUCOSE 40 % PO GEL: 1.0000 | ORAL | Status: DC

## 2020-03-14 NOTE — Progress Notes (Signed)
Patient felt better after receiving 80mg  of IV lasix.  sats at 92 to 93 most of the time will desat to 80's when she fall asleep.  Patient for transfer to [facility] for close observation after cardiac amyloid study.  Report called to [facility],  RN receiving patient.   Patient alert of oriented,  Sitting up in chair,  Getting out of bed with 2 assist despite having increased fatique today. Denies any chest discomfort or any other pain.  Transferred to 2H 15 at 2.55pm.  Patient's mother,  Christine Benitez informed through phone about the transfer.

## 2020-03-14 NOTE — Progress Notes (Signed)
   Subjective:  Patient evaluated at bedside this AM. Difficulty arousing this morning. Lying uncomfortably in bed, complaining of abdominal pain. Patient aware she has been hospitalized for stroke, although says she is in [place].    Objective:  Vital signs in last 24 hours: Vitals:   03/13/20 1610 03/13/20 2002 03/13/20 2332 03/14/20 0328  BP: 115/86 102/81 113/81 (!) 126/91  Pulse: 96 (!) 101 (!) 102 (!) 104  Resp: 18 16 18 20   Temp: 97.6 F (36.4 C) 98 F (36.7 C) 97.8 F (36.6 C) 97.6 F (36.4 C)  TempSrc: Oral Oral Oral Oral  SpO2: 100% 99% 97% 94%  Weight:      Height:       Physical Exam: General: Somnolent, no acute distress Abdomen: Generalized pain, no guarding MSK: Cold extremities  Assessment/Plan: Christine Benitez is 59yo female with HFrEF, type 2 diabetes admitted 11/2 for acute metabolic encephalopathy 2/2 acute anterior right thalamic infarct, found to have worsening cardiac function concerning for cardiogenic shock.  Principal Problem:   Cerebral thrombosis with cerebral infarction Active Problems:   Chronic HFrEF (heart failure with reduced ejection fraction) (HCC)   Diabetes mellitus (HCC)   Hypertension   Pulmonary hypertension (HCC)   Chronic cholecystitis with calculus   Acute urinary retention  #Cardiogenic Shock #Acute on Chronic Systolic Heart Failure  This morning patient had worsening mentation, cooler extremities, and worsening generalized abdominal pain. Cardiology consulted given concern for cardiogenic shock. TTE during admission showed severely reduced ejection fraction (20-25%) with moderately reduced RV function and severe MR, TR and global longitudinal straining with apical sparing. Concern for cardiac amyloidosis, PYP obtained this morning. 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 SPEP, immunofixation, urine lights chains pending. Labs obtained this  morning showed elevated lactic, pending VBG. Patient to be transferred to ICU with plans for central line placement. Will continue to follow. - Appreciate cardiology recs - Pending SPEP, immunofixation, urine light chains - cMRI Monday per cards - Continue holding home meds - Will discuss inotropic medication with cards  #Acute anterior right thalamic infarct #Acute metabolic encephalopathy Patient with worsening mentation this morning as it was more difficult to awaken her. Given her worsening cardiac and renal function, will hold off cerebral angiogram. Will continue giving DAPT, neurology following. - ASA, Plavix - Tele  #Acute kidney injury #Urinary retention  Patient's renal function has continued to worsen since admission despite gentle hydration. FeNa 0.1%. Patient has had minimal po intake, likely multifactorial given her cardiogenic shock as well. Will continue to monitor, hopeful for improvement of kidney function with inotropic support.  - C/w Foley - Trend BMP  #T2DM A1c 7.3. Early this AM CBG 60, given D5 with appropriate response. Patient was NPO this AM for PYP scan but has had worsening po intake over last 24 hours. Will need to continue monitoring once patient is more alert and can tolerate food. - C/w SSI, d/c Lantus  DIET: Renal IVF: n/a DVT PPX: Lovenox BOWEL: Miralax CODE: FULL  Prior to Admission Living Arrangement: Home Anticipated Discharge Location: SNF Barriers to Discharge: medical management, mental status Dispo: Anticipated discharge in approximately 4-5 day(s).   Monday, MD 03/14/2020, 6:57 AM Pager:  [phone] After 5pm on weekdays and 1pm on weekends: On Call pager [phone]

## 2020-03-14 NOTE — Progress Notes (Signed)
Pt's CBG 63, will treat with D50 IV as the pt is NPO currently for procedure, will continue to monitor

## 2020-03-14 NOTE — Progress Notes (Addendum)
Inpatient Diabetes Program Recommendations  AACE/ADA: New Consensus Statement on Inpatient Glycemic Control (2015)  Target Ranges:  Prepandial:   less than 140 mg/dL      Peak postprandial:   less than 180 mg/dL (1-2 hours)      Critically ill patients:  140 - 180 mg/dL   Lab Results  Component Value Date   GLUCAP 138 (H) 03/14/2020   HGBA1C 7.3 (H) 03/12/2020   Results for AMEYA, VOWELL (MRN [id number]) as of 03/14/2020 10:01  Ref. Range 03/13/2020 11:38 03/13/2020 17:07 03/13/2020 21:18 03/14/2020 06:24 03/14/2020 06:53  Glucose-Capillary Latest Ref Range: 70 - 99 mg/dL 83 607 (H) 371 (H) 63 (L) 138 (H)   Review of Glycemic Control  Diabetes history: type 2 Outpatient Diabetes medications: Humalog 75/25 insulin 20 units at breakfast, 30 units at dinner, Metformin 500 mg BID Current orders for Inpatient glycemic control: Lantus 10 units at HS, Novolog MOD TID  Inpatient Diabetes Program Recommendations:   Noted that patient had a low CBG at 06:24 this am.   Recommend decreasing Novolog correction scale to SENSITIVE. May need to decrease Lantus dose to 8 units daily. Patient may need to decrease dosage of insulin at home if blood sugars continue to be close to normal.   Smith Mince RN BSN CDE Diabetes Coordinator Pager: [phone]  8am-5pm

## 2020-03-14 NOTE — CV Procedure (Signed)
Central Venous Catheter Insertion Procedure Note Christine Benitez [id number] [birth year]    Procedure: Insertion of Central Venous Catheter Indications: Drug and/or fluid administration   Procedure Details Consent: Risks of procedure as well as the alternatives and risks of each were explained to the (patient/caregiver).  Consent for procedure obtained. Time Out: Verified patient identification, verified procedure, site/side was marked, verified correct patient position, special equipment/implants available, medications/allergies/relevent history reviewed, required imaging and test results available.  Performed   Maximum sterile technique was used including antiseptics, cap, gloves, gown, hand hygiene, mask and sheet. Skin prep: Chlorhexidine; local anesthetic administered A antimicrobial bonded/coated triple lumen catheter was placed in the right internal jugular vein using the Seldinger technique and u/s guidance.   Evaluation Blood flow good Complications: No apparent complications Patient did tolerate procedure well. Chest X-ray ordered to verify placement.  CXR: pending   Arvilla Meres, MD  4:55 PM

## 2020-03-14 NOTE — Consult Note (Addendum)
Advanced Heart Failure Team Consult Note   Primary Physician: Leonides Sake, MD PCP-Cardiologist:  No primary care provider on file.  Reason for Consultation: Chronic Systolic Heart Failure, ? New Diagnosis of AL Amyloid   HPI:    PATRISIA Benitez is seen today for evaluation of systolic heart failure and ? new diagnosis of AL amyloid, at the request of Dr. Evette Doffing, Internal Medicine.   59 y/o female w/ h/o tobacco abuse, IDDM, family h/o premature ischemic heart disease (mother w/ MI in her 54s), and chronic systolic heart failure, first diagnosed in 2016 when she was admitted to [facility] for acute CHF. Echo at that time showed reduced LVEF 30-35% w/ mod LVH, mild-mod MR and mod PH w/ PA pressure 60-65 mmHg. She had diagnostic LHC performed by Dr. Martinique which showed mild nonobstructive CAD (10-20% LAD and 20% LCx disease, RCA was small, nondominant w/ no significant coronary plaque). She was placed on medical therapy for systolic heart failure w/ an ACEi +  blocker. She was seen by general cardiology for her post hospital f/u, however was lost to f/u thereafter.   She was admitted 11/2 for acute CVA w/ with left-sided weakness, impaired extraocular movement, right eyelid droop.  CT of Head and Neck showed distal basilar artery stenosis as well as acute thalamic stroke. IR consulted w/ plans to perform cerebral angiogram today.   As part of stroke w/u, 2D echo was perfomed and showed no intracardiac source of embolus. EF however is lower compared to study in 2016, now 20-25% w/ moderately reduced RV systolic function, severe LVH, severe MR and severe TR. Findings strongly suggestive of cardiac amyloidosis. 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 Immunofixation  electrophoresis, Kappa/labmda light chains pending.   Also w/ AKI. SCr on admit was 1.43, gradually climbing, 1.67>>1.89 today. Lactic acid also checked today and elevated at 4.9. BP normotensive. EKG and tele w/ no afib.   At time of evaluation, pt found w/ increased WOB and desaturations in the upper 80s w/ bibasilar crackles on exam. Wt trending up, 117>>120>>124 lb. Tele shows sinus tach, low 100s.  She is confused. Unsure of baseline. May have underlying cognitive impairment.    Echo 2016 (done at [facility]) LVEF 30-35% w/ mod LVH, mild-mod MR and mod PH w/ PA pressure 60-65 mmHg  LHC 06/2014 Procedural Findings: Hemodynamics: AO 105/62 mean 81 mm Hg LV 104/15 mm Hg  Coronary angiography: Coronary dominance: left  Left mainstem: Normal.   Left anterior descending (LAD): The LAD is a large vessel that wraps around the apex and supplies the inferior wall. There are mild irregularities less than 10-20%.   Left circumflex (LCx): Large dominant vessel with mild disease less than 20%.   Right coronary artery (RCA): Small, nondominant. No significant disease.   Left ventriculography: Left ventricular systolic function is abnormal, there is global hypokinesis.  LVEF is estimated at 30-35%, there is no significant mitral regurgitation   Final Conclusions:   1. Minor nonobstructive CAD 2. Severe LV dysfunction  Recommendations: Medical management  Echo 03/12/20  1. Left ventricular ejection fraction, by estimation, is 20 to 25%. The left ventricle has severely decreased function. There is severe left ventricular hypertrophy. Left ventricular diastolic parameters are indeterminate. The average left  ventricular global longitudinal strain is -6.7 %. The global longitudinal strain is abnormal, with a pattern of relative apical sparing. SEE RECOMMENDATIONS. 2. Right ventricular systolic function is moderately reduced. The right ventricular size is mildly enlarged.  Moderately increased right ventricular wall thickness. There is severely elevated pulmonary artery systolic pressure. The estimated right ventricular systolic pressure is 16.1 mmHg. 3. Left atrial size was moderately dilated. 4. Right atrial size was mild to moderately dilated. 5. The mitral valve is grossly normal. Severe mitral valve regurgitation. 6. Tricuspid valve regurgitation is severe. 7. The aortic valve is grossly normal. Aortic valve regurgitation is trivial. 8. The inferior vena cava is dilated in size with <50% respiratory variability, suggesting right atrial pressure of 15 mmHg. 9. A small pericardial effusion is present. The pericardial effusion is posterior to the left ventricle. Conclusion(s)/Recommendation(s): No intracardiac source of embolism detected on this transthoracic study. A transesophageal echocardiogram is recommended to exclude cardiac source of embolism if clinically indicated. There is severe LVH with right ventricular hypertrophy and a global longitudinal strain pattern with relative apical sparing. Findings strongly suggestive of cardiac amyloidosis. Recommend further evaluation. No definite LV apical thrombus, though Definity contrast was not given to fully exclude.  PYP Scan 03/14/20 FINDINGS: Planar Visual assessment:  Anterior planar imaging demonstrates radiotracer uptake within the heart greater than uptake within the adjacent ribs (Grade 2).  Quantitative assessment :  Quantitative assessment of the cardiac uptake compared to the contralateral chest wall is equal to 1.45 (H/CL = 1.45) .  SPECT assessment: Not performed  IMPRESSION: Visual and quantitative assessment (grade 2, H/CL = 1.45) are strongly suggestive of transthyretin amyloidosis.  Review of Systems: [y] = yes, [ ]  = no   . General: Weight gain [ Y]; Weight loss [ ] ; Anorexia [ ] ; Fatigue [ ] ; Fever [ ] ; Chills [ ] ; Weakness [ ]   . Cardiac: Chest pain/pressure [ ] ; Resting  SOB [ Y]; Exertional SOB [ Y]; Orthopnea [ ] ; Pedal Edema [ ] ; Palpitations [ ] ; Syncope [ ] ; Presyncope [ ] ; Paroxysmal nocturnal dyspnea[ ]   . Pulmonary: Cough [ ] ; Wheezing[ ] ; Hemoptysis[ ] ; Sputum [ ] ; Snoring [ ]   . GI: Vomiting[ ] ; Dysphagia[ ] ; Melena[ ] ; Hematochezia [ ] ; Heartburn[ ] ; Abdominal pain [ ] ; Constipation [ ] ; Diarrhea [ ] ; BRBPR [ ]   . GU: Hematuria[ ] ; Dysuria [ ] ; Nocturia[ ]   . Vascular: Pain in legs with walking [ ] ; Pain in feet with lying flat [ ] ; Non-healing sores [ ] ; Stroke [Y ]; TIA [ ] ; Slurred speech [ ] ;  . Neuro: Headaches[ ] ; Vertigo[ ] ; Seizures[ ] ; Paresthesias[ ] ;Blurred vision [ ] ; Diplopia [ ] ; Vision changes [Y]  . Ortho/Skin: Arthritis [ ] ; Joint pain [ ] ; Muscle pain [ ] ; Joint swelling [ ] ; Back Pain [ ] ; Rash [ ]   . Psych: Depression[ ] ; Anxiety[ ]   . Heme: Bleeding problems [ ] ; Clotting disorders [ ] ; Anemia [ ]   . Endocrine: Diabetes [ Y]; Thyroid dysfunction[ ]   Home Medications Prior to Admission medications   Medication Sig Start Date End Date Taking? Authorizing Provider  albuterol (VENTOLIN HFA) 108 (90 Base) MCG/ACT inhaler Inhale 2 puffs into the lungs every 6 (six) hours as needed for wheezing or shortness of breath. 03/07/20   [provider]  aspirin 81 MG EC tablet TAKE 1 TABLET (81 MG TOTAL) BY MOUTH DAILY. Patient taking differently: Take 81 mg by mouth daily.  09/10/14   Martinique, Peter M, MD  atorvastatin (LIPITOR) 20 MG tablet TAKE 1 TABLET BY MOUTH EVERY EVENING Patient taking differently: Take 20 mg by mouth daily.  07/22/15   Martinique, Peter M, MD  carvedilol (COREG) 12.5 MG tablet Take 12.5-25 mg by mouth See admin instructions. Taking 1 tablet (12.5 mg totally) by mouth in the AM and 2 tablet (25 mg totally) by mouth at night 01/06/20   [provider]  enalapril (VASOTEC) 2.5 MG tablet TAKE 1 TABLET (2.5 MG TOTAL) BY MOUTH 2 (TWO) TIMES DAILY. Patient taking differently: Take 2.5 mg by mouth 2 (two) times daily.   09/16/14   Martinique, Peter M, MD  furosemide (LASIX) 40 MG tablet Take 40 mg by mouth 2 (two) times daily. 03/05/20   [provider]  HUMALOG MIX 75/25 (75-25) 100 UNIT/ML SUSP injection Inject 20-30 Units into the skin See admin instructions. Inject 20 units into the skin before breakfast and 30 units before dinner 02/01/20   [provider]  insulin NPH-regular Human (NOVOLIN 70/30) (70-30) 100 UNIT/ML injection Take 25 units with breakfast and 20 units with dinner daily Patient not taking: Reported on 03/12/2020 06/29/15   Virginia Crews, MD  metFORMIN (GLUCOPHAGE) 500 MG tablet Take 500 mg by mouth 2 (two) times daily. 01/06/20   [provider]    Past Medical History: Past Medical History:  Diagnosis Date  . Abnormal LFTs    a. 05/2014 -  ALT 52, alk phos 130.  Marland Kitchen Chronic systolic CHF (congestive heart failure) ([place])    a. Dx 05/2014 - echo at [facility] - EF 30-35%, moderate LVH, no rWMA, mild to moderate MR, moderate PH with PA pressure 60-41mmHg, mild to moderate pericardial effusion. EF 30-35% by cath.  . History of blood transfusion 1986   "related to C-section"  . Hypertension   . Insulin dependent diabetes mellitus   . Iron deficiency anemia   . NICM (nonischemic cardiomyopathy) ([place])    a. [place] 06/11/14:  minor nonobstructive CAD with mild irregularities in the LAD less than 10-20%, LCx with mild disease less than 20%, no significant disease in the RCA, EF 30-35%.  . Pericardial effusion    a. Mild-moderate pericardial effusion by echo at [facility].  . Pulmonary hypertension ([place])    a. Moderate PH by echo at [facility].  . Tobacco abuse     Past Surgical History: Past Surgical History:  Procedure Laterality Date  . APPENDECTOMY  ~ 2003  . CESAREAN SECTION  1986  . LEFT HEART CATHETERIZATION WITH CORONARY ANGIOGRAM N/A 06/11/2014   Procedure: LEFT HEART CATHETERIZATION WITH CORONARY ANGIOGRAM;  Surgeon: Peter M Martinique, MD;  Location: [facility] CATH LAB;  Service:  Cardiovascular;  Laterality: N/A;    Family History: Family History  Problem Relation Age of Onset  . Coronary artery disease Mother   . Diabetes Mother   . Lupus Mother   . Stroke Mother   . Lung cancer Father     Social History: Social History   Socioeconomic History  . Marital status: Divorced    Spouse name: Not on file  . Number of children: Not on file  . Years of education: Not on file  . Highest education level: Not on file  Occupational History  . Not on file  Tobacco Use  . Smoking status: Former Smoker    Packs/day: 0.50    Years: 22.00    Pack years: 11.00    Types: Cigarettes    Quit date: 03/26/2015    Years since quitting:  4.9  . Smokeless tobacco: Never Used  Substance and Sexual Activity  . Alcohol use: No  . Drug use: No  . Sexual activity: Yes  Other Topics Concern  . Not on file  Social History Narrative  . Not on file   Social Determinants of Health   Financial Resource Strain:   . Difficulty of Paying Living Expenses: Not on file  Food Insecurity:   . Worried About Charity fundraiser in the Last Year: Not on file  . Ran Out of Food in the Last Year: Not on file  Transportation Needs:   . Lack of Transportation (Medical): Not on file  . Lack of Transportation (Non-Medical): Not on file  Physical Activity:   . Days of Exercise per Week: Not on file  . Minutes of Exercise per Session: Not on file  Stress:   . Feeling of Stress : Not on file  Social Connections:   . Frequency of Communication with Friends and Family: Not on file  . Frequency of Social Gatherings with Friends and Family: Not on file  . Attends Religious Services: Not on file  . Active Member of Clubs or Organizations: Not on file  . Attends Archivist Meetings: Not on file  . Marital Status: Not on file    Allergies:  No Known Allergies  Objective:    Vital Signs:   Temp:  [97.6 F (36.4 C)-98 F (36.7 C)] 97.8 F (36.6 C) (11/05 0748) Pulse Rate:   [96-109] 109 (11/05 0748) Resp:  [16-20] 18 (11/05 0748) BP: (102-129)/(81-96) 128/90 (11/05 0748) SpO2:  [94 %-100 %] 97 % (11/05 0748) Weight:  [56.4 kg] 56.4 kg (11/05 0600) Last BM Date:  (tender with anuria, I/O cath gave relieved discomfort)  Weight change: Filed Weights   03/11/20 0947 03/13/20 0554 03/14/20 0600  Weight: 53.4 kg 54.8 kg 56.4 kg    Intake/Output:   Intake/Output Summary (Last 24 hours) at 03/14/2020 1129 Last data filed at 03/14/2020 0500 Gross per 24 hour  Intake 1754.63 ml  Output 300 ml  Net 1454.63 ml      Physical Exam    General:  Confused w/ increased WOB HEENT: normal Neck: supple. JVP elevated to jaw . Carotids 2+ bilat; no bruits. No lymphadenopathy or thyromegaly appreciated. Cor: PMI nondisplaced. Regular, rhythm, tachy rate. 3/6 MR murmur at apex  Lungs: bibasilar crackles  Abdomen: soft, nontender, nondistended. No hepatosplenomegaly. No bruits or masses. Good bowel sounds. Extremities: no cyanosis, clubbing, rash, edema Neuro: confused    Telemetry   Sinus tach 110s  EKG    NSR w/ LVH   Labs   Basic Metabolic Panel: Recent Labs  Lab 03/11/20 1320 03/11/20 1435 03/11/20 1435 03/12/20 0500 03/13/20 0137 03/14/20 0554  NA 137 138  --  137 138 141  K 3.9 3.4*  --  4.2 3.6 3.6  CL  --  95*  --  96* 99 105  CO2  --  23  --  20* 21* 20*  GLUCOSE  --  322*  --  289* 139* 60*  BUN  --  21*  --  27* 30* 34*  CREATININE  --  1.43*  --  1.67* 1.65* 1.89*  CALCIUM  --  8.4*   < > 7.9* 8.3* 8.4*  PHOS  --   --   --   --  3.4  --    < > = values in this interval not displayed.    Liver Function  Tests: Recent Labs  Lab 03/11/20 1435 03/12/20 0500 03/13/20 0137  AST 27 35  --   ALT 16 21  --   ALKPHOS 131* 110  --   BILITOT 3.4* 2.9*  --   PROT 6.8 6.2*  --   ALBUMIN 3.1* 3.0* 2.9*   No results for input(s): LIPASE, AMYLASE in the last 168 hours. Recent Labs  Lab 03/11/20 1031  AMMONIA 18    CBC: Recent Labs   Lab 03/11/20 1031 03/11/20 1320 03/12/20 0500 03/13/20 0137  WBC 7.2  --  6.7 6.5  NEUTROABS 5.6  --   --   --   HGB 16.1* 17.7* 13.9 13.3  HCT 51.2* 52.0* 43.3 40.7  MCV 98.1  --  95.6 94.9  PLT 251  --  203 227    Cardiac Enzymes: Recent Labs  Lab 03/11/20 1435  CKTOTAL 243*    BNP: BNP (last 3 results) No results for input(s): BNP in the last 8760 hours.  ProBNP (last 3 results) No results for input(s): PROBNP in the last 8760 hours.   CBG: Recent Labs  Lab 03/13/20 1138 03/13/20 1707 03/13/20 2118 03/14/20 0624 03/14/20 0653  GLUCAP 83 133* 103* 63* 138*    Coagulation Studies: Recent Labs    03/11/20 1200 03/12/20 0500  LABPROT 14.3 16.4*  INR 1.2 1.4*     Imaging   NM CARDIAC AMYLOID TUMOR LOC INFLAM SPECT 1 DAY  Result Date: 03/14/2020 CLINICAL DATA:  HEART FAILURE. CONCERN FOR CARDIAC AMYLOIDOSIS. EXAM: NUCLEAR MEDICINE TUMOR LOCALIZATION. PYP CARDIAC AMYLOIDOSIS SCAN WITH SPECT TECHNIQUE: Following intravenous administration of radiopharmaceutical, anterior planar images of the chest were obtained. Regions of interest were placed on the heart and contralateral chest wall for quantitative assessment. Additional SPECT imaging of the chest was not obtained; patient was unable to tolerate being with the camera and holding still. RADIOPHARMACEUTICALS:  19.8 mCi TECHNETIUM 99 PYROPHOSPHATE FINDINGS: Planar Visual assessment: Anterior planar imaging demonstrates radiotracer uptake within the heart greater than uptake within the adjacent ribs (Grade 2). Quantitative assessment : Quantitative assessment of the cardiac uptake compared to the contralateral chest wall is equal to 1.45 (H/CL = 1.45) . SPECT assessment: Not performed IMPRESSION: Visual and quantitative assessment (grade 2, H/CL = 1.45) are strongly suggestive of transthyretin amyloidosis. Electronically Signed   By: Ulyses Southward M.D.   On: 03/14/2020 10:41      Medications:     Current  Medications: . aspirin EC  325 mg Oral Daily  . atorvastatin  40 mg Oral QPM  . Chlorhexidine Gluconate Cloth  6 each Topical Daily  . clopidogrel  75 mg Oral Daily  . insulin aspart  0-15 Units Subcutaneous TID WC  . insulin glargine  10 Units Subcutaneous QHS     Infusions:    Assessment/Plan   1. Acute on Chronic Systolic Heart Failure>> Cardiogenic Shock  - Echo 2016 EF 30-35%, RV moderately reduced. LHC showed mild nonobstructive CAD - Echo this admit shows EF 20-25%, RV severely reduced, severe LVH and thicken valves concerning for AL amyloid. PYP scan not suggestive of TTR amyloid. Also ? Burned out hypertensive CM based on appearance of EKG - she is in shock w/ AMS and acidotic. Lactic acid 4.9. Hypoxic w/ bibasilar crackles.  Plan to transfer to ICU and will plan central line access to check Co-ox and follow CVPs  - Start IV Lasix 80 mg bid - once stabilized, will plan cMRI, likely on Monday  - Immunofixation electrophoresis, Kappa/labmda  light chains pending.  - avoid ARB/ARNi, spiro and dig for now given AKI  - no  blocker w/ shock   2. 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 IR consulted w/ plans to perform cerebral angiogram today.  - no intracardiac source of embolus noted on echo - no afib detection on tele  - management per neuro and IR  3. AKI - SCr on admit was 1.43, gradually climbing, 1.67>>1.89 - likely due to cardiogenic shock/ cardiorenal  - plan central line access to assess hemodynamics, suggest she will need inotropic support - continue diuresis - follow daily BMPs  4. IDDD - management per primary team  - on SSI   5. CAD - mild nonobstructive CAD by cath in 2016, w/  10-20% LAD and 20% LCx disease, RCA was small, nondominant w/ no significant coronary plaque - medical management     Length of Stay: [address] Ladoris Gene  03/14/2020, 11:29 AM  Advanced Heart Failure  Team Pager [phone] (M-F; 7a - 4p)  Please contact [facility] for night-coverage after hours (4p -7a ) and weekends on [email or web address]  Agree with above  26 y/o woman with severe HTN, DM, CKD 3 and NICM. Admitted earlier this weak with acute CVA.  Subsequently has developed lethargy and AKI with lactic acidosis.  Echo showed EF 20 with severe biventricular hypertrophy and severe RV dysfunction. Echo concerning for cardiac amyloidosis  Subsequently has had PYP scan which is negative. Myeloma labs pending  On exam General:  Lying in bed lethargic HEENT: normal Neck: supple. JVP to ear Carotids 2+ bilat; no bruits. No lymphadenopathy or thryomegaly appreciated. Cor: PMI nondisplaced. Regular rate & rhythm. No rubs, gallops or murmurs. Lungs: crackles 1/2 up bilaterally  Abdomen: soft, + tender, nondistended. No hepatosplenomegaly. No bruits or masses. Good bowel sounds. Extremities: no cyanosis, clubbing, rash, trace edema + cool Neuro: lethargic but will respond to noxious stimuli  She is in cardiogenic shock. Echo suggestive of cardiac amyloidosis but PYP is negative for TTR amyloid. 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   Will move to ICU. Place central access. Start inotrope support. Will need cardiac MRI to further evaluate when more stable.   D/w Dr. Evette Doffing.   CRITICAL CARE Performed by: Glori Bickers  Total critical care time: 55 minutes  Critical care time was exclusive of separately billable procedures and treating other patients.  Critical care was necessary to treat or prevent imminent or life-threatening deterioration.  Critical care was time spent personally by me (independent of midlevel providers or residents) on the following activities: development of treatment plan with patient and/or surrogate as well as nursing, discussions with consultants, evaluation  of patient's response to treatment, examination of patient, obtaining history from patient or surrogate, ordering and performing treatments and interventions, ordering and review of laboratory studies, ordering and review of radiographic studies, pulse oximetry and re-evaluation of patient's condition.  Glori Bickers, MD  1:48 PM

## 2020-03-14 NOTE — Progress Notes (Signed)
NIR.  Patient was scheduled for an image-guided diagnostic cerebral arteriogram tentatively for today in IR with Dr. Corliss Skains.  AM labs revealed bump in creatinine (currently 1.89 up from 1.65). Received request from IMTS to hold on procedure at this time. Discussed case with Dr. Corliss Skains. Spoke with Dr. Marijo Conception who recommends holding off on procedure at this time- states she has other medical issues that take priority at this time (cardiogenic shock- possibly transfer to ICU). Requests re-evaluation on Monday 03/17/2020 for possible procedure early next week. Recommend IMTS make patient NPO at midnight for Monday if requesting procedure on Monday. Recommend hydrating patient with 75 cc NS and repeat Cr Monday AM.  Please call NIR with questions/concerns.   Waylan Boga Sadik Piascik, PA-C 03/14/2020, 1:51 PM

## 2020-03-14 NOTE — Progress Notes (Signed)
Physical Therapy Treatment Patient Details Name: Christine Benitez MRN: [id number] DOB: Sep 10, 1960 Today's Date: 03/14/2020    History of Present Illness Pt is a 59 y/o female admitted secondary to AMS and fall. 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 PMH includes CHF, HTN, DM, CAD, and pulm HTN.     PT Comments    Pt continues to display endurance deficits as she easily becomes fatigued with short standing or gait bouts. Her L knee strength deficits also impact her balance. She required assistance and continual VCs and intermittent TCs at L knee during transfers to sequence steps and manage the RW. However, she was able to sequence transitions supine to sit with increased ease this date. Will continue to follow acutely and recommend SNF to address her deficits to maximize her independence and safety with all functional mobility.    Follow Up Recommendations  SNF;Supervision/Assistance - 24 hour     Equipment Recommendations  Other (comment) (TBD)    Recommendations for Other Services       Precautions / Restrictions Precautions Precautions: Fall Precaution Comments: foley catheter; HOH Restrictions Weight Bearing Restrictions: No    Mobility  Bed Mobility Overal bed mobility: Needs Assistance Bed Mobility: Supine to Sit     Supine to sit: Min assist;HOB elevated     General bed mobility comments: Pt able to initiate LEs off EOB and ascension of trunk, but required minA to complete trunk ascension and square hips with EOB.   Transfers Overall transfer level: Needs assistance Equipment used: Rolling walker (2 wheeled) Transfers: Sit to/from [employer] Sit to Stand: Min assist;Mod assist         General transfer comment: Repeated cues for proper hand placement prior to transfers. Unsteadiness noted and extra time required to power up to stand. ModA to come to stand 1st rep then minA 2nd rep. ModA to manage RW and to maintain balance with stand step transfer to  R.  Ambulation/Gait Ambulation/Gait assistance: Mod assist (intermittent modA) Gait Distance (Feet): 3 Feet Assistive device: Rolling walker (2 wheeled) Gait Pattern/deviations: Step-through pattern;Decreased stride length;Decreased stance time - left;Decreased step length - left;Shuffle;Trunk flexed;Narrow base of support Gait velocity: decreased Gait velocity interpretation: <1.31 ft/sec, indicative of household ambulator General Gait Details: Provided VCs and TCs at hips and L knee and hamstring to assist with increasing stance time bilaterally and to advance L LE during swing and prevent genu recurvatum during stance. Pt fatigues easily and required modA, with moments of LOB.   Stairs             Wheelchair Mobility    Modified Rankin (Stroke Patients Only) Modified Rankin (Stroke Patients Only) Pre-Morbid Rankin Score: No significant disability Modified Rankin: Moderately severe disability     Balance Overall balance assessment: Needs assistance Sitting-balance support: Bilateral upper extremity supported;Feet supported Sitting balance-Leahy Scale: Poor Sitting balance - Comments: UE support on bed to maintain static sitting balance, with min guard for safety.   Standing balance support: Bilateral upper extremity supported;During functional activity Standing balance-Leahy Scale: Poor Standing balance comment: B UE support on RW and min-modA to maintain balance with mobility due to trunk sway and unsteadiness in L knee.                            Cognition Arousal/Alertness: Awake/alert Behavior During Therapy: Flat affect Overall Cognitive Status: No family/caregiver present to determine baseline cognitive functioning Area of Impairment: Attention;Following commands;Safety/judgement;Awareness;Problem solving  Current Attention Level: Sustained   Following Commands: Follows one step commands inconsistently;Follows one step  commands with increased time Safety/Judgement: Decreased awareness of safety;Decreased awareness of deficits Awareness: Intellectual Problem Solving: Slow processing;Decreased initiation;Difficulty sequencing;Requires verbal cues;Requires tactile cues General Comments: Required repeated cues for hand placement with transfers and step-by-step cues to sequence and problem-solve how to manage RW and steps during transfers.      Exercises      General Comments        Pertinent Vitals/Pain Pain Assessment: No/denies pain Pain Intervention(s): Limited activity within patient's tolerance;Monitored during session    Home Living                      Prior Function            PT Goals (current goals can now be found in the care plan section) Acute Rehab PT Goals Patient Stated Goal: to be able to walk and go home PT Goal Formulation: With patient Time For Goal Achievement: 03/26/20 Potential to Achieve Goals: Fair Progress towards PT goals: Progressing toward goals    Frequency    Min 3X/week      PT Plan Current plan remains appropriate    Co-evaluation              AM-PAC PT "6 Clicks" Mobility   Outcome Measure  Help needed turning from your back to your side while in a flat bed without using bedrails?: A Little Help needed moving from lying on your back to sitting on the side of a flat bed without using bedrails?: A Little Help needed moving to and from a bed to a chair (including a wheelchair)?: A Little Help needed standing up from a chair using your arms (e.g., wheelchair or bedside chair)?: A Little Help needed to walk in hospital room?: A Little Help needed climbing 3-5 steps with a railing? : A Lot 6 Click Score: 17    End of Session Equipment Utilized During Treatment: Gait belt Activity Tolerance: Patient tolerated treatment well;Patient limited by fatigue Patient left: in chair;with call bell/phone within reach;with chair alarm set;with  nursing/sitter in room Nurse Communication: Mobility status PT Visit Diagnosis: Unsteadiness on feet (R26.81);Muscle weakness (generalized) (M62.81);Other symptoms and signs involving the nervous system (R29.898);Other abnormalities of gait and mobility (R26.89);Difficulty in walking, not elsewhere classified (R26.2)     Time: 6712-4580 PT Time Calculation (min) (ACUTE ONLY): 15 min  Charges:  $Therapeutic Activity: 8-22 mins                     Raymond Gurney, PT, DPT Acute Rehabilitation Services  Pager: [phone] Office: [phone]    Jewel Baize 03/14/2020, 2:45 PM

## 2020-03-14 NOTE — Progress Notes (Signed)
STROKE TEAM PROGRESS NOTE   INTERVAL HISTORY Pt RN at bedside. Pt found to have increased WOB and desat. Cardiology consulted for cardiogenic shock and possible cardiac amyloidosis. Cerebral angiogram today was cancelled. Cre also on the rise.    Vitals:   03/13/20 2332 03/14/20 0328 03/14/20 0600 03/14/20 0748  BP: 113/81 (!) 126/91  128/90  Pulse: (!) 102 (!) 104  (!) 109  Resp: 18 20  18   Temp: 97.8 F (36.6 C) 97.6 F (36.4 C)  97.8 F (36.6 C)  TempSrc: Oral Oral    SpO2: 97% 94%  97%  Weight:   56.4 kg   Height:       CBC:  Recent Labs  Lab 03/11/20 1031 03/11/20 1320 03/12/20 0500 03/13/20 0137  WBC 7.2   < > 6.7 6.5  NEUTROABS 5.6  --   --   --   HGB 16.1*   < > 13.9 13.3  HCT 51.2*   < > 43.3 40.7  MCV 98.1   < > 95.6 94.9  PLT 251   < > 203 227   < > = values in this interval not displayed.   Basic Metabolic Panel:  Recent Labs  Lab 03/13/20 0137 03/14/20 0554  NA 138 141  K 3.6 3.6  CL 99 105  CO2 21* 20*  GLUCOSE 139* 60*  BUN 30* 34*  CREATININE 1.65* 1.89*  CALCIUM 8.3* 8.4*  PHOS 3.4  --    Lipid Panel:  Recent Labs  Lab 03/12/20 0500  CHOL 80  TRIG 73  HDL 24*  CHOLHDL 3.3  VLDL 15  LDLCALC 41   HgbA1c:  Recent Labs  Lab 03/12/20 0645  HGBA1C 7.3*   Urine Drug Screen:  Recent Labs  Lab 03/11/20 1031  LABOPIA NONE DETECTED  COCAINSCRNUR NONE DETECTED  LABBENZ NONE DETECTED  AMPHETMU NONE DETECTED  THCU NONE DETECTED  LABBARB NONE DETECTED    Alcohol Level  Recent Labs  Lab 03/11/20 1329  ETH <10    IMAGING past 24 hours No results found.  PHYSICAL EXAM  Temp:  [97.6 F (36.4 C)-98 F (36.7 C)] 97.8 F (36.6 C) (11/05 0748) Pulse Rate:  [96-109] 109 (11/05 0748) Resp:  [16-20] 18 (11/05 0748) BP: (102-129)/(81-96) 128/90 (11/05 0748) SpO2:  [94 %-100 %] 97 % (11/05 0748) Weight:  [56.4 kg] 56.4 kg (11/05 0600)  General - Well nourished, well developed, lethargic.  Ophthalmologic - fundi not visualized  due to noncooperation.  Cardiovascular - Regular rhythm and rate.  Respiratory - increased WOB, crackles bilaterally  Neuro - lethargic, eyes open on voice.  Severe hard of hearing, orientated to age and self, not orientated to place and time. Able to speak with short sentences, no aphasia, however paucity of speech, able to name 2/3, mild to moderate dysarthria.  Able to follow simple commands.  Blinking to visual threat bilaterally.  Extraocular movement impaired, left eye adduction incomplete, otherwise no significant extraocular movement on request.  Left pupil 2 mm, right pupil 3.5 mm, both reactive to light.  Left facial droop, left palpebral fissure enlarged than right.  Severe hard of hearing.  Tongue midline.  Left upper extremity 3-/5 with drift to bed within 10sec.  Left lower extremity 2/5.  Right upper and lower extremity at least 4/5.  Not cooperative on sensation and coordination test.  Gait not tested.   ASSESSMENT/PLAN Ms. DEMARIA DEENEY is a 59 y.o. female with history of of CHF, HTN, IDDM, iron deficiency  anemia, NICM, CAD, pulmonary hypertension and smoking found down, presenting with lethargy and L sided weakness .   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 large vessel disease source.  Cardioembolic source cannot be completely excluded due to low EF.  CT head R thalamic hypodensity. Old L parietal cortial and subcortical infarct.   MRI  Anterior R thalamic into parasagittal R midbrain infarct   CTA head & neck Limited eval d/t bolus timing - severe distal BA stenosis, B PCA narrowing and possible proximal R PComA significant stenosis, possible R cavernous ICA stenosis. R>L pleural effusions.    Plan reassess for cerebral angiogram Monday 11/8 to evaluate basilar artery stenosis  2D Echo EF 20 to 25%.  LA moderately dilated.  LDL 41  HgbA1c 7.3  VTE prophylaxis - Lovenox 40 mg sq daily   aspirin 81 mg daily prior to  admission, now on aspirin 325 mg daily and clopidogrel 75 mg daily.   Therapy recommendations:  SNF  Disposition:  pending   Basilar artery stenosis  CTA head and neck showed severe distal basilar artery stenosis  Plan reassess for cerebral angiogram Monday 11/8  On DAPT  Cardiomyopathy CHF Cardiogenic Shock  TTE showed EF 20 to 25%  Home meds including Coreg, Lasix and enalapril  11/5 am in cardiogenic shock w/ desat and respiratory distress - lactic acid 4.9, hypoxic  Transferred to cardiac ICU - HF team following  On milrinone IV  Consider cardiac event monitoring as outpatient to rule out A. fib  Hypertension  Stable on the low side . Home meds including Coreg, Lasix and enalapril . Consider gradually resume home meds if BP allows . Long-term BP goal normotensive  Hyperlipidemia  Home meds:  lipitor 20  Now on lipitor 40  LDL 41, goal < 70  Continue statin at discharge  Diabetes type II Uncontrolled  HgbA1c 7.3, goal < 7.0  SSI  CBG monitoring  Hyperglycemia->hypoglycemia   Off Lantus  DB RN following  Close PCP follow-up for better DM control  AKI on CKD  Creatinine 1.43-1.67->1.65->1.89   Off IVF due to cardiogenic shock  Renal function monitoring   Other Stroke Risk Factors  Former Cigarette smoker, quit 4 yrs ago  Family hx stroke (mother)  Other Active Problems  Pulmonary HTN  Chronic cholecystitis w/ calculus  Hospital day # 3  Neurology will follow up on Monday. Please call with questions during the weekend.  Marvel Plan, MD PhD Stroke Neurology 03/14/2020 9:34 AM   To contact Stroke Continuity provider, please refer to [email or web address]. After hours, contact General Neurology
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
## 2020-03-14 NOTE — Progress Notes (Signed)
SLP Cancellation Note  Patient Details Name: Christine Benitez MRN: [id number] DOB: 1960-12-25   Cancelled treatment:       Reason Eval/Treat Not Completed: Medical issues which prohibited therapy; Pt with worsening status this AM, abdominal pain, cold extremities, worsening mental state.  Medical team recommending further stat work up to determine cause and is requesting to defer evaluation at this time.  Will follow up as pt is medically able to participate in treatment.     Ebrahim Deremer, Melanee Spry 03/14/2020, 10:47 AM

## 2020-03-15 DIAGNOSIS — I5023 Acute on chronic systolic (congestive) heart failure: Secondary | ICD-10-CM | POA: Diagnosis not present

## 2020-03-15 DIAGNOSIS — I6330 Cerebral infarction due to thrombosis of unspecified cerebral artery: Secondary | ICD-10-CM | POA: Diagnosis not present

## 2020-03-15 DIAGNOSIS — R570 Cardiogenic shock: Secondary | ICD-10-CM | POA: Diagnosis not present

## 2020-03-15 LAB — BASIC METABOLIC PANEL
Anion gap: 10 (ref 5–15)
BUN: 28 mg/dL — ABNORMAL HIGH (ref 6–20)
CO2: 29 mmol/L (ref 22–32)
Calcium: 7.9 mg/dL — ABNORMAL LOW (ref 8.9–10.3)
Chloride: 99 mmol/L (ref 98–111)
Creatinine, Ser: 1.61 mg/dL — ABNORMAL HIGH (ref 0.44–1.00)
GFR, Estimated: 37 mL/min — ABNORMAL LOW (ref >60)
Glucose, Bld: 121 mg/dL — ABNORMAL HIGH (ref 70–99)
Potassium: 3.3 mmol/L — ABNORMAL LOW (ref 3.5–5.1)
Sodium: 138 mmol/L (ref 135–145)

## 2020-03-15 LAB — COMPREHENSIVE METABOLIC PANEL
ALT: 26 U/L (ref 0–44)
AST: 56 U/L — ABNORMAL HIGH (ref 15–41)
Albumin: 2.8 g/dL — ABNORMAL LOW (ref 3.5–5.0)
Alkaline Phosphatase: 212 U/L — ABNORMAL HIGH (ref 38–126)
Anion gap: 13 (ref 5–15)
BUN: 31 mg/dL — ABNORMAL HIGH (ref 6–20)
CO2: 26 mmol/L (ref 22–32)
Calcium: 8.5 mg/dL — ABNORMAL LOW (ref 8.9–10.3)
Chloride: 101 mmol/L (ref 98–111)
Creatinine, Ser: 1.79 mg/dL — ABNORMAL HIGH (ref 0.44–1.00)
GFR, Estimated: 32 mL/min — ABNORMAL LOW (ref >60)
Glucose, Bld: 185 mg/dL — ABNORMAL HIGH (ref 70–99)
Potassium: 4.3 mmol/L (ref 3.5–5.1)
Sodium: 140 mmol/L (ref 135–145)
Total Bilirubin: 4.2 mg/dL — ABNORMAL HIGH (ref 0.3–1.2)
Total Protein: 6 g/dL — ABNORMAL LOW (ref 6.5–8.1)

## 2020-03-15 LAB — CBC
HCT: 40.9 % (ref 36.0–46.0)
Hemoglobin: 13.1 g/dL (ref 12.0–15.0)
MCH: 30.5 pg (ref 26.0–34.0)
MCHC: 32 g/dL (ref 30.0–36.0)
MCV: 95.3 fL (ref 80.0–100.0)
Platelets: 209 10*3/uL (ref 150–400)
RBC: 4.29 MIL/uL (ref 3.87–5.11)
RDW: 18.4 % — ABNORMAL HIGH (ref 11.5–15.5)
WBC: 6.4 10*3/uL (ref 4.0–10.5)
nRBC: 0 % (ref 0.0–0.2)

## 2020-03-15 LAB — COOXEMETRY PANEL
Carboxyhemoglobin: 1.4 % (ref 0.5–1.5)
Methemoglobin: 0.7 % (ref 0.0–1.5)
O2 Saturation: 66.5 %
Total hemoglobin: 13.5 g/dL (ref 12.0–16.0)

## 2020-03-15 LAB — MAGNESIUM: Magnesium: 1.5 mg/dL — ABNORMAL LOW (ref 1.7–2.4)

## 2020-03-15 LAB — GLUCOSE, CAPILLARY
Glucose-Capillary: 156 mg/dL — ABNORMAL HIGH (ref 70–99)
Glucose-Capillary: 293 mg/dL — ABNORMAL HIGH (ref 70–99)
Glucose-Capillary: 319 mg/dL — ABNORMAL HIGH (ref 70–99)
Glucose-Capillary: 327 mg/dL — ABNORMAL HIGH (ref 70–99)
Glucose-Capillary: 84 mg/dL (ref 70–99)

## 2020-03-15 LAB — MRSA PCR SCREENING: MRSA by PCR: NEGATIVE

## 2020-03-15 MED ORDER — SPIRONOLACTONE 12.5 MG HALF TABLET
12.5000 mg | ORAL_TABLET | Freq: Every day | ORAL | Status: DC
  Administered 2020-03-15 – 2020-03-19 (×5): 12.5 mg via ORAL
  Filled 2020-03-15 (×5): qty 1

## 2020-03-15 MED ORDER — MAGNESIUM SULFATE 4 GM/100ML IV SOLN
4.0000 g | Freq: Once | INTRAVENOUS | Status: AC
  Administered 2020-03-15: 4 g via INTRAVENOUS
  Filled 2020-03-15: qty 100

## 2020-03-15 MED ORDER — INSULIN ASPART 100 UNIT/ML ~~LOC~~ SOLN: 0.0000 [IU] | Freq: Every day | SUBCUTANEOUS | Status: DC

## 2020-03-15 MED ORDER — SODIUM CHLORIDE 0.9% FLUSH
10.0000 mL | Freq: Two times a day (BID) | INTRAVENOUS | Status: DC
  Administered 2020-03-15 – 2020-03-23 (×15): 10 mL

## 2020-03-15 MED ORDER — SODIUM CHLORIDE 0.9% FLUSH: 10.0000 mL | INTRAVENOUS | Status: DC | PRN

## 2020-03-15 MED ORDER — INSULIN ASPART 100 UNIT/ML ~~LOC~~ SOLN
0.0000 [IU] | Freq: Three times a day (TID) | SUBCUTANEOUS | Status: DC
  Administered 2020-03-15 (×2): 11 [IU] via SUBCUTANEOUS

## 2020-03-15 MED ORDER — ASPIRIN EC 81 MG PO TBEC
81.0000 mg | DELAYED_RELEASE_TABLET | Freq: Every day | ORAL | Status: DC
  Administered 2020-03-16 – 2020-03-26 (×11): 81 mg via ORAL
  Filled 2020-03-15 (×11): qty 1

## 2020-03-15 MED ORDER — FUROSEMIDE 10 MG/ML IJ SOLN
80.0000 mg | Freq: Once | INTRAMUSCULAR | Status: AC
  Administered 2020-03-15: 80 mg via INTRAVENOUS
  Filled 2020-03-15: qty 8

## 2020-03-15 MED ORDER — INSULIN GLARGINE 100 UNIT/ML ~~LOC~~ SOLN
10.0000 [IU] | Freq: Every day | SUBCUTANEOUS | Status: DC
  Administered 2020-03-15: 10 [IU] via SUBCUTANEOUS
  Filled 2020-03-15 (×2): qty 0.1

## 2020-03-15 MED ORDER — POTASSIUM CHLORIDE 10 MEQ/50ML IV SOLN
10.0000 meq | INTRAVENOUS | Status: AC
  Administered 2020-03-15 – 2020-03-16 (×6): 10 meq via INTRAVENOUS
  Filled 2020-03-15 (×6): qty 50

## 2020-03-15 MED ORDER — INSULIN ASPART 100 UNIT/ML ~~LOC~~ SOLN
0.0000 [IU] | SUBCUTANEOUS | Status: DC
  Administered 2020-03-15: 3 [IU] via SUBCUTANEOUS
  Administered 2020-03-16: 8 [IU] via SUBCUTANEOUS
  Administered 2020-03-16: 3 [IU] via SUBCUTANEOUS
  Administered 2020-03-16: 8 [IU] via SUBCUTANEOUS
  Administered 2020-03-16 (×2): 5 [IU] via SUBCUTANEOUS
  Administered 2020-03-17: 8 [IU] via SUBCUTANEOUS
  Administered 2020-03-17 (×2): 2 [IU] via SUBCUTANEOUS
  Administered 2020-03-17 – 2020-03-18 (×2): 8 [IU] via SUBCUTANEOUS

## 2020-03-15 MED ORDER — INSULIN ASPART 100 UNIT/ML ~~LOC~~ SOLN: 0.0000 [IU] | SUBCUTANEOUS | Status: DC

## 2020-03-15 NOTE — Progress Notes (Addendum)
Advanced Heart Failure Rounding Note   Subjective:    Central access placed yesterday. Co-ox 45%. Started on milrinone.   Co-ox this am 67%. Much more alert. Lactate clearing. Renal function stabilizing. Excellent diuresis.   Denies SOB,orthopnea or PND.    Objective:   Weight Range:  Vital Signs:   Temp:  [97.7 F (36.5 C)-98.6 F (37 C)] 97.7 F (36.5 C) (11/06 0323) Pulse Rate:  [98-115] 102 (11/06 0300) Resp:  [15-31] 20 (11/06 0300) BP: (115-141)/(70-104) 115/70 (11/06 0300) SpO2:  [89 %-98 %] 95 % (11/06 0300) Weight:  [56.4 kg] 56.4 kg (11/05 0600) Last BM Date:  (tender with anuria, I/O cath gave relieved discomfort)  Weight change: Filed Weights   03/11/20 0947 03/13/20 0554 03/14/20 0600  Weight: 53.4 kg 54.8 kg 56.4 kg    Intake/Output:   Intake/Output Summary (Last 24 hours) at 03/15/2020 0343 Last data filed at 03/15/2020 0200 Gross per 24 hour  Intake 1441.25 ml  Output 3600 ml  Net -2158.75 ml     Physical Exam: General:  More alert no resp difficulty  HEENT: normal R sided facial droop and ptosis Neck: supple. JVP to jaw . Carotids 2+ bilat; no bruits. No lymphadenopathy or thryomegaly appreciated. Cor: PMI nondisplaced. Regular rate & rhythm. No rubs, gallops or murmurs. Lungs: clear Abdomen: soft, nontender, nondistended. No hepatosplenomegaly. No bruits or masses. Good bowel sounds. Extremities: no cyanosis, clubbing, rash, edema Neuro: alert & orientedx3, cranial nerves grossly intact. moves all 4 extremities w/o difficulty. Affect pleasant  Telemetry: sinus 90-105 Personally reviewed   Labs: Basic Metabolic Panel: Recent Labs  Lab 03/12/20 0500 03/12/20 0500 03/13/20 0137 03/13/20 0137 03/14/20 0554 03/14/20 2108 03/15/20 0300  NA 137  --  138  --  141 139 140  K 4.2  --  3.6  --  3.6 3.2* 4.3  CL 96*  --  99  --  105 100 101  CO2 20*  --  21*  --  20* 25 26  GLUCOSE 289*  --  139*  --  60* 175* 185*  BUN 27*  --  30*   --  34* 35* 31*  CREATININE 1.67*  --  1.65*  --  1.89* 1.76* 1.79*  CALCIUM 7.9*   < > 8.3*   < > 8.4* 8.4* 8.5*  PHOS  --   --  3.4  --   --   --   --    < > = values in this interval not displayed.    Liver Function Tests: Recent Labs  Lab 03/11/20 1435 03/12/20 0500 03/13/20 0137 03/15/20 0300  AST 27 35  --  56*  ALT 16 21  --  26  ALKPHOS 131* 110  --  212*  BILITOT 3.4* 2.9*  --  4.2*  PROT 6.8 6.2*  --  6.0*  ALBUMIN 3.1* 3.0* 2.9* 2.8*   No results for input(s): LIPASE, AMYLASE in the last 168 hours. Recent Labs  Lab 03/11/20 1031  AMMONIA 18    CBC: Recent Labs  Lab 03/11/20 1031 03/11/20 1320 03/12/20 0500 03/13/20 0137 03/15/20 0300  WBC 7.2  --  6.7 6.5 6.4  NEUTROABS 5.6  --   --   --   --   HGB 16.1* 17.7* 13.9 13.3 13.1  HCT 51.2* 52.0* 43.3 40.7 40.9  MCV 98.1  --  95.6 94.9 95.3  PLT 251  --  203 227 209    Cardiac Enzymes: Recent Labs  Lab 03/11/20 1435  CKTOTAL 243*    BNP: BNP (last 3 results) No results for input(s): BNP in the last 8760 hours.  ProBNP (last 3 results) No results for input(s): PROBNP in the last 8760 hours.    Other results:  Imaging: DG Chest 1 View  Result Date: 03/14/2020 CLINICAL DATA:  Central line placement. EXAM: CHEST  1 VIEW COMPARISON:  Radiograph 03/11/2020 FINDINGS: Tip of the right internal jugular central venous catheter in the region of the atrial caval junction. No pneumothorax. Right greater than left pleural effusions and adjacent compressive atelectasis. Slight increase in pleural fluid since prior exam. Cardiomegaly is not significantly changed. Interstitial opacities typical of pulmonary edema. Streaky atelectasis in the right mid lung. No acute osseous abnormalities are seen. IMPRESSION: 1. Tip of the right internal jugular central venous catheter in the region of the atrial caval junction. No pneumothorax. 2. Right greater than left pleural effusions with adjacent compressive atelectasis,  increased from prior. Cardiomegaly is unchanged. Pulmonary edema. Electronically Signed   By: Narda Rutherford M.D.   On: 03/14/2020 17:18   NM CARDIAC AMYLOID TUMOR LOC INFLAM SPECT 1 DAY  Result Date: 03/14/2020 CLINICAL DATA:  HEART FAILURE. CONCERN FOR CARDIAC AMYLOIDOSIS. EXAM: NUCLEAR MEDICINE TUMOR LOCALIZATION. PYP CARDIAC AMYLOIDOSIS SCAN WITH SPECT TECHNIQUE: Following intravenous administration of radiopharmaceutical, anterior planar images of the chest were obtained. Regions of interest were placed on the heart and contralateral chest wall for quantitative assessment. Additional SPECT imaging of the chest was not obtained; patient was unable to tolerate being with the camera and holding still. RADIOPHARMACEUTICALS:  19.8 mCi TECHNETIUM 99 PYROPHOSPHATE FINDINGS: Planar Visual assessment: Anterior planar imaging demonstrates radiotracer uptake within the heart greater than uptake within the adjacent ribs (Grade 2). Quantitative assessment : Quantitative assessment of the cardiac uptake compared to the contralateral chest wall is equal to 1.45 (H/CL = 1.45) . SPECT assessment: Not performed IMPRESSION: Visual and quantitative assessment (grade 2, H/CL = 1.45) are strongly suggestive of transthyretin amyloidosis. Electronically Signed   By: Ulyses Southward M.D.   On: 03/14/2020 10:41      Medications:     Scheduled Medications: . aspirin EC  325 mg Oral Daily  . atorvastatin  40 mg Oral QPM  . Chlorhexidine Gluconate Cloth  6 each Topical Daily  . clopidogrel  75 mg Oral Daily  . insulin aspart  0-15 Units Subcutaneous TID WC     Infusions: . milrinone 0.25 mcg/kg/min (03/15/20 0100)     PRN Medications:  acetaminophen **OR** acetaminophen, ondansetron **OR** ondansetron (ZOFRAN) IV, polyethylene glycol  Plan/Discussion:    1. Acute on Chronic Systolic Heart Failure>> Cardiogenic Shock  - Echo 2016 EF 30-35%, RV moderately reduced. LHC showed mild nonobstructive CAD - Echo this  admit shows EF 20-25%, RV severely reduced, severe LVH and thicken valves concerning for AL amyloid. Echo suggestive of cardiac amyloidosis but PYP is negative for TTR amyloid. 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  - shock improving with milrinone. Co-ox 66% CVP 10 - Continue milrinone and IV diuresis - Add spiro today. Possibly dig and losartan tomorrow pending renal function. If renal function slow to improved will use Bidil.  - no ? blocker w/ shock  - plan cMRI Monday - SPEP negative. Kappa light chains mildly elevated. Suspect non-specific. If MRI c/w infiltrative pattern will need fat pad biopsy but I doubt this  is AL amyloid - likely not candidate for advanced therapies given comorbidities.  2. 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 IR consulted w/ plans to perform cerebral angiogram today.  - no intracardiac source of embolus noted on echo - no afib detection on tele  - management per neuro and IR - Pt/OT  3. AKI - SCr on admit was 1.43, gradually climbing, 1.67>>1.89 > 1.79 - likely due to cardiogenic shock/ cardiorenal  - improving with inotropes  4. IDDD - management per primary team  - on SSI   5. CAD - mild nonobstructive CAD by cath in 2016, w/  10-20% LAD and 20% LCx disease, RCA was small, nondominant w/ no significant coronary plaque - medical management  - no s/s angina  CRITICAL CARE Performed by: Arvilla Meres  Total critical care time: 35 minutes  Critical care time was exclusive of separately billable procedures and treating other patients.  Critical care was necessary to treat or prevent imminent or life-threatening deterioration.  Critical care was time spent personally by me (independent of midlevel providers or residents) on the following activities: development of treatment plan  with patient and/or surrogate as well as nursing, discussions with consultants, evaluation of patient's response to treatment, examination of patient, obtaining history from patient or surrogate, ordering and performing treatments and interventions, ordering and review of laboratory studies, ordering and review of radiographic studies, pulse oximetry and re-evaluation of patient's condition.     Length of Stay: 4   Arvilla Meres MD 03/15/2020, 3:43 AM  Advanced Heart Failure Team Pager [phone] (M-F; 7a - 4p)  Please contact [facility] for night-coverage after hours (4p -7a ) and weekends on [email or web address]

## 2020-03-15 NOTE — Progress Notes (Signed)
   Subjective:  Patient evaluated at bedside this AM. Patient is more awake and alert this morning. She notes feeling "okay". She denies any chest pain at this time. She notes that she is admitted for head injury from stroke. Patient gave permission to speak with her mother. Patient has known history of CHF for which she follows a cardiologist in [place]. Unable to provide further information at this time.   Objective:  Vital signs in last 24 hours: Vitals:   03/15/20 0323 03/15/20 0400 03/15/20 0500 03/15/20 0600  BP:  117/76 106/61 (!) 102/55  Pulse:  (!) 102 98 99  Resp:  (!) 25 15 16   Temp: 97.7 F (36.5 C)     TempSrc: Oral     SpO2:  94% 94% 93%  Weight:   47.9 kg   Height:       Physical Exam: General: Pleasant, no acute distress CV: Regular rate, rhythm. Systolic murmur present Extremities: Warm, able to move all four extremities. Neuro: Alert, oriented to person, situation, place.   Assessment/Plan: [name] is 59yo female with HFrEF, type 2 diabetes admitted 11/2 for acute metabolic encephalopathy 2/2 acute right thalamic infarct now on inotropic support due to cardiogenic shock.  Principal Problem:   Cardiogenic shock (HCC) Active Problems:   Chronic HFrEF (heart failure with reduced ejection fraction) (HCC)   Diabetes mellitus (HCC)   Hypertension   Pulmonary hypertension (HCC)   Chronic cholecystitis with calculus   Cerebral thrombosis with cerebral infarction   Acute urinary retention  #Cardiogenic Shock #Acute on Chronic Systolic Heart Failure Patient admitted to ICU yesterday due to worsening cardiac function. TTE earlier in admission with EF 20-25%, reduced RV function, and severe MR, TR. Co-ox on arrival to ICU 45%, inotropic support started with milrinone. Cardiology on board, work-up for cardiac amyloidosis inconclusive thus far. SPEP negative with mild increase in urine light chains. Cardiac MRI Monday for further evaluation. Differential also  include hypertensive cardiomyopathy. Lactic acidosis improving, pH on VBG yesterday 7.39. Diuresed patient with 160mg  IV lasix with appropriate urinary output. This AM mentation improved, extremities much warmer than yesterday. Does not appear volume overloaded. Co-ox 67% w/ CVP 14. Per cards, will continue diuresing today and add spironolactone. - C/w milrinone, IV lasix - Add spironolactone - Cardiac MRI Monday  #Acute anterior right thalamic infarct #Acute metabolic encephalopathy Mentation improved with morning with inotropic support. D/c'd cerebral angiogram yesterday given cardiac and renal function. Continue DAPT, can consider cerebral angiogram on Monday pending clinical status. - ASA, Plavix - Tele - Consider cerebral angiogram Monday/next week  #Acute kidney injury #Urinary retention AKI improving after starting inotropic support yesterday. Cr 1.89 > 1.79. Continue to monitor as patient receives IV diuretics for CHF. Good UOP over last 24. Will continue to monitor output and function and consider angiogram over the next week. - C/w Foley for bladder compression - Trend BMP  #T2DM A1c 7.3 during admission. CBG's increasing over last 12 hours as she has had improved mentation and able to tolerate po. Decreasing sensitivity of SSI and adding Lantus 10. Will adjust as needed - C/w SSI, Lantus 10  DIET: Renal IVF: n/a DVT PPX: Lovenox BOWEL: Miaralax CODE: FULL  Prior to Admission Living Arrangement: Home Anticipated Discharge Location: SNF Barriers to Discharge: medical management, mental status Dispo: Anticipated discharge in approximately 5-7 day(s).   Saturday, MD 03/15/2020, 7:00 AM Pager: [phone] After 5pm on weekdays and 1pm on weekends: On Call pager [phone]

## 2020-03-16 DIAGNOSIS — J189 Pneumonia, unspecified organism: Secondary | ICD-10-CM

## 2020-03-16 DIAGNOSIS — I5023 Acute on chronic systolic (congestive) heart failure: Secondary | ICD-10-CM | POA: Diagnosis not present

## 2020-03-16 DIAGNOSIS — I6330 Cerebral infarction due to thrombosis of unspecified cerebral artery: Secondary | ICD-10-CM | POA: Diagnosis not present

## 2020-03-16 DIAGNOSIS — N179 Acute kidney failure, unspecified: Secondary | ICD-10-CM | POA: Diagnosis present

## 2020-03-16 DIAGNOSIS — R570 Cardiogenic shock: Secondary | ICD-10-CM | POA: Diagnosis not present

## 2020-03-16 LAB — CULTURE, BLOOD (ROUTINE X 2)
Culture: NO GROWTH
Culture: NO GROWTH

## 2020-03-16 LAB — COOXEMETRY PANEL
Carboxyhemoglobin: 1.4 % (ref 0.5–1.5)
Methemoglobin: 0.9 % (ref 0.0–1.5)
O2 Saturation: 76 %
Total hemoglobin: 13.1 g/dL (ref 12.0–16.0)

## 2020-03-16 LAB — CBC
HCT: 39.6 % (ref 36.0–46.0)
Hemoglobin: 12.9 g/dL (ref 12.0–15.0)
MCH: 30.8 pg (ref 26.0–34.0)
MCHC: 32.6 g/dL (ref 30.0–36.0)
MCV: 94.5 fL (ref 80.0–100.0)
Platelets: 188 10*3/uL (ref 150–400)
RBC: 4.19 MIL/uL (ref 3.87–5.11)
RDW: 17.8 % — ABNORMAL HIGH (ref 11.5–15.5)
WBC: 6 10*3/uL (ref 4.0–10.5)
nRBC: 0.3 % — ABNORMAL HIGH (ref 0.0–0.2)

## 2020-03-16 LAB — GLUCOSE, CAPILLARY
Glucose-Capillary: 167 mg/dL — ABNORMAL HIGH (ref 70–99)
Glucose-Capillary: 169 mg/dL — ABNORMAL HIGH (ref 70–99)
Glucose-Capillary: 172 mg/dL — ABNORMAL HIGH (ref 70–99)
Glucose-Capillary: 201 mg/dL — ABNORMAL HIGH (ref 70–99)
Glucose-Capillary: 206 mg/dL — ABNORMAL HIGH (ref 70–99)
Glucose-Capillary: 230 mg/dL — ABNORMAL HIGH (ref 70–99)
Glucose-Capillary: 255 mg/dL — ABNORMAL HIGH (ref 70–99)
Glucose-Capillary: 279 mg/dL — ABNORMAL HIGH (ref 70–99)
Glucose-Capillary: 36 mg/dL — CL (ref 70–99)

## 2020-03-16 LAB — BASIC METABOLIC PANEL
Anion gap: 10 (ref 5–15)
BUN: 25 mg/dL — ABNORMAL HIGH (ref 6–20)
CO2: 30 mmol/L (ref 22–32)
Calcium: 8.1 mg/dL — ABNORMAL LOW (ref 8.9–10.3)
Chloride: 98 mmol/L (ref 98–111)
Creatinine, Ser: 1.44 mg/dL — ABNORMAL HIGH (ref 0.44–1.00)
GFR, Estimated: 42 mL/min — ABNORMAL LOW (ref >60)
Glucose, Bld: 41 mg/dL — CL (ref 70–99)
Potassium: 3.7 mmol/L (ref 3.5–5.1)
Sodium: 138 mmol/L (ref 135–145)

## 2020-03-16 MED ORDER — MILRINONE LACTATE IN DEXTROSE 20-5 MG/100ML-% IV SOLN
0.1250 ug/kg/min | INTRAVENOUS | Status: DC
  Administered 2020-03-16 – 2020-03-18 (×2): 0.125 ug/kg/min via INTRAVENOUS
  Filled 2020-03-16: qty 100

## 2020-03-16 MED ORDER — DEXTROSE 50 % IV SOLN
25.0000 g | INTRAVENOUS | Status: AC
  Administered 2020-03-16: 25 g via INTRAVENOUS

## 2020-03-16 MED ORDER — INSULIN GLARGINE 100 UNIT/ML ~~LOC~~ SOLN
5.0000 [IU] | Freq: Every day | SUBCUTANEOUS | Status: DC
  Administered 2020-03-16 – 2020-03-17 (×2): 5 [IU] via SUBCUTANEOUS
  Filled 2020-03-16 (×3): qty 0.05

## 2020-03-16 MED ORDER — DEXTROSE 50 % IV SOLN
INTRAVENOUS | Status: AC
  Filled 2020-03-16: qty 50

## 2020-03-16 NOTE — Progress Notes (Signed)
   Subjective:  Patient evaluated at bedside this AM. Per RN at bedside, she has been difficult to arouse and with pinpoint pupils. On examination, Christine Benitez is somnolent and difficult to arouse. On further arousal she is saying "where is my food". She says she is "cold and hungry".   Objective:  Vital signs in last 24 hours: Vitals:   03/16/20 0200 03/16/20 0300 03/16/20 0400 03/16/20 0500  BP: 101/66 99/65 (!) 90/56 (!) 87/59  Pulse: 75 72 74 71  Resp: 19 17 14 14   Temp:      TempSrc:      SpO2: 99% 99% 97% 100%  Weight:      Height:       Physical Exam: General: Chronically ill-appearing, no acute distress MSK: Extremities warm, dry. Neuro: Somnolent, difficult to arouse. Moving all four extremities. Pupils sluggish but reactive  Assessment/Plan: Christine Benitez is 59yo female with HFrEF, type 2 diabetes admitted 11/2 for acute metabolic encephalopathy 2/2 acute right thalamic infarct now requiring inotropic support due to cardiogenic shock, responding well to diuretics.  Principal Problem:   Cardiogenic shock (HCC) Active Problems:   Chronic HFrEF (heart failure with reduced ejection fraction) (HCC)   Diabetes mellitus (HCC)   Hypertension   Pulmonary hypertension (HCC)   Chronic cholecystitis with calculus   Cerebral thrombosis with cerebral infarction   Acute urinary retention  #Cardiogenic shock #Acute on chronic systolic heart failure TTE on admission w/ EF 20-25%, reduced RV function, severe MR, TR and global longitudinal straining with apical straining. Concern for cardiac amyloidosis, PYP inconclusive. SPEP negative, mildly elevated light chains. Cardiac function continues to improve over last day. Co-ox 75% < 67%. Received IV lasix 80 yesterday with adequate UOP. Per cards plan to decrease milrinone and d/c lasix. Cardiac MRI tomorrow for infiltrative evaluation, although cardiomyopathy felt to be more likely 2/2 hypertension. - Appreciate cardiology recs -  Decrease milrinone 0.125 - D/c lasix - C/w spironolactone - Adding entresto - Cardiac MRI tomorrow if able   #Acute anterior right thalamic infarct #Acute metabolic encephalopathy Patient mentation waxing and waning. Will hold off cerebral angiogram given results will not change current treatment plan. Continue DAPT. - ASA, Plavix - Tele  #Acute kidney injury #Urinary retention Cr continues to improve, 1.74 > 1.44 given inotropic support. UOP 2.1L over last 24hr. Will d/c lasix today per cards. - C/w Foley for bladder compression - Trend RFP  #T2DM A1c 7.3 this admission. Glucose this AM 36, received D5 with subsequent CBG 170. Given tenuous mental status difficult to predict po intake for adequate sugar control. - Decrease Lantus 5u QHS - C/w SSI  DIET: Renal IVF: n/a DVT PPX: Lovenox BOWEL: Miralax CODE: FULL FAM COM: Spoke to patient's mother yesterday and updated her on patient's condition. She says she is planning to visit Christine Benitez today.   Prior to Admission Living Arrangement: Home Anticipated Discharge Location: SNF, pending PT/OT eval Barriers to Discharge: medical management, mental status Dispo: Anticipated discharge in approximately 5-7 day(s).   13/2, MD 03/16/2020, 6:10 AM Pager: [phone] After 5pm on weekdays and 1pm on weekends: On Call pager [phone]

## 2020-03-16 NOTE — Evaluation (Signed)
Speech Language Pathology Evaluation Patient Details Name: Christine Benitez MRN: [id number] DOB: Jul 29, 1960 Today's Date: 03/16/2020 Time: 6378-5885 SLP Time Calculation (min) (ACUTE ONLY): 20 min  Problem List:  Patient Active Problem List   Diagnosis Date Noted  . AKI (acute kidney injury) ([place])   . Community acquired pneumonia   . Cardiogenic shock ([place]) 03/14/2020  . Acute urinary retention 03/13/2020  . Chronic cholecystitis with calculus 03/12/2020  . Cerebral thrombosis with cerebral infarction 03/12/2020  . Abnormal LFTs   . Hypertension   . NICM (nonischemic cardiomyopathy) ([place])   . Pulmonary hypertension ([place])   . Chronic HFrEF (heart failure with reduced ejection fraction) ([place]) 06/10/2014  . Tobacco abuse 06/10/2014  . Diabetes mellitus ([place]) 06/10/2014  . Family history of premature CAD 06/10/2014   Past Medical History:  Past Medical History:  Diagnosis Date  . Abnormal LFTs    a. 05/2014 -  ALT 52, alk phos 130.  Marland Kitchen Chronic systolic CHF (congestive heart failure) ([place])    a. Dx 05/2014 - echo at [facility] - EF 30-35%, moderate LVH, no rWMA, mild to moderate MR, moderate PH with PA pressure 60-4mmHg, mild to moderate pericardial effusion. EF 30-35% by cath.  . History of blood transfusion 1986   "related to C-section"  . Hypertension   . Insulin dependent diabetes mellitus   . Iron deficiency anemia   . NICM (nonischemic cardiomyopathy) ([place])    a. [place] 06/11/14:  minor nonobstructive CAD with mild irregularities in the LAD less than 10-20%, LCx with mild disease less than 20%, no significant disease in the RCA, EF 30-35%.  . Pericardial effusion    a. Mild-moderate pericardial effusion by echo at [facility].  . Pulmonary hypertension ([place])    a. Moderate PH by echo at [facility].  . Tobacco abuse    Past Surgical History:  Past Surgical History:  Procedure Laterality Date  . APPENDECTOMY  ~ 2003  . CESAREAN SECTION  1986  . LEFT HEART CATHETERIZATION WITH CORONARY  ANGIOGRAM N/A 06/11/2014   Procedure: LEFT HEART CATHETERIZATION WITH CORONARY ANGIOGRAM;  Surgeon: Peter M Martinique, MD;  Location: [facility] CATH LAB;  Service: Cardiovascular;  Laterality: N/A;   HPI:  59 year old person living with chronic nonischemic cardiomyopathy with reduced ejection fraction and insulin-dependent diabetes presented to the emergency department on 03/11/20 with acute encephalopathy and admitted with an acute cerebral infarction of the anterior right thalamus extending into the right midbrain. Pt is a poor historian and family was not present during BSE.  Decreased alertness level noted with HOH status exacerbating cognitive deficits.   Assessment / Plan / Recommendation Clinical Impression  Patient presents with a mod cognitive-linguistic deficit with a score of 7/30 on [facility] Mental Status exam), placing her in the range of Dementia (scores 1-20). She was oriented to time and was aware that she was in a hospital but she was adamant that "We are not in [place], we are in [name]" and "I know the difference between [facility] and this hospital". Patient benefited from SLP repeating questions due to being [facility] and did require rephrasing of directions due to some difficulties with her comprehension. Patient demonstrated poor delayed recall and this appeared due to storage and retrieval deficits. She would benefit from skilled SLP interventoin during acute care stay and beyond in order to improve her cognitive functioning and overall reasoning/safety awareness and orientation to place, situation.    SLP Assessment  SLP Recommendation/Assessment: Patient needs continued [place] Pathology  Services SLP Visit Diagnosis: Cognitive communication deficit (R41.841)    Follow Up Recommendations  Skilled Nursing facility    Frequency and Duration min 2x/week  2 weeks      SLP Evaluation Cognition  Overall Cognitive Status: No family/caregiver present to  determine baseline cognitive functioning Arousal/Alertness: Awake/alert Orientation Level: Oriented to person;Oriented to time;Oriented to situation;Disoriented to place Attention: Sustained Sustained Attention: Appears intact Memory: Impaired Memory Impairment: Retrieval deficit;Storage deficit Awareness: Impaired Awareness Impairment: Intellectual impairment Problem Solving: Impaired Problem Solving Impairment: Verbal basic;Verbal complex       Comprehension  Auditory Comprehension Overall Auditory Comprehension: Other (comment) (difficult to assess fully as patient very HOH (ie when asked to draw clock, she said "a box?" and proceeded to draw a square within the circle for clock drawing task) Conversation: Simple Interfering Components: Attention;Hearing;Processing speed EffectiveTechniques: Extra processing time;Increased volume;Repetition    Expression Expression Primary Mode of Expression: Verbal Verbal Expression Overall Verbal Expression: Appears within functional limits for tasks assessed Initiation: No impairment   Oral / Motor  Oral Motor/Sensory Function Overall Oral Motor/Sensory Function: Mild impairment Facial Symmetry: Abnormal symmetry right Facial Strength: Reduced right   [place], MA, CCC-SLP Speech Therapy

## 2020-03-16 NOTE — Progress Notes (Signed)
Advanced Heart Failure Rounding Note   Subjective:    Central access placed 11/5. Co-ox 45%. Started on milrinone.   Remains on milrinone. Was much more alert yesterday. This am had severe hypoglycemic event which was treated. Will arouse but more lethargic today. Says she is sleepy.   Co-ox this am 76% on milrinone 0.25. Diuresed well on IV lasix. Weight doesn't seem accurate. CVP 5-6   Poor historian  But denies SOB, orthopnea or PND   Objective:   Weight Range:  Vital Signs:   Temp:  [94.3 F (34.6 C)-98.5 F (36.9 C)] 98.5 F (36.9 C) (11/07 1116) Pulse Rate:  [71-105] 99 (11/07 1200) Resp:  [12-31] 17 (11/07 1200) BP: (82-125)/(53-86) 106/67 (11/07 1200) SpO2:  [93 %-100 %] 96 % (11/07 1200) Weight:  [50.3 kg] 50.3 kg (11/07 0500) Last BM Date: 03/14/20  Weight change: Filed Weights   03/14/20 0600 03/15/20 0500 03/16/20 0500  Weight: 56.4 kg 47.9 kg 50.3 kg    Intake/Output:   Intake/Output Summary (Last 24 hours) at 03/16/2020 1247 Last data filed at 03/16/2020 1200 Gross per 24 hour  Intake 716.23 ml  Output 2275 ml  Net -1558.77 ml     Physical Exam: General:  Lethargic but arousbale No resp difficulty HEENT: normal + R sided ptosis Neck: supple. JVP 5- Carotids 2+ bilat; no bruits. No lymphadenopathy or thryomegaly appreciated. Cor: PMI nondisplaced. Regular rate & rhythm. No rubs, gallops or murmurs. Lungs: clear Abdomen: soft, nontender, nondistended. No hepatosplenomegaly. No bruits or masses. Good bowel sounds. Extremities: no cyanosis, clubbing, rash, edema Neuro: alert & orientedx3, cranial nerves grossly intact. moves all 4 extremities w/o difficulty. Affect pleasant   Telemetry: sinus 80-90sPersonally reviewed   Labs: Basic Metabolic Panel: Recent Labs  Lab 03/13/20 0137 03/13/20 0137 03/14/20 0554 03/14/20 0554 03/14/20 2108 03/14/20 2108 03/15/20 0300 03/15/20 2205 03/16/20 0316  NA 138   < > 141  --  139  --  140 138 138   K 3.6   < > 3.6  --  3.2*  --  4.3 3.3* 3.7  CL 99   < > 105  --  100  --  101 99 98  CO2 21*   < > 20*  --  25  --  26 29 30   GLUCOSE 139*   < > 60*  --  175*  --  185* 121* 41*  BUN 30*   < > 34*  --  35*  --  31* 28* 25*  CREATININE 1.65*   < > 1.89*  --  1.76*  --  1.79* 1.61* 1.44*  CALCIUM 8.3*   < > 8.4*   < > 8.4*   < > 8.5* 7.9* 8.1*  MG  --   --   --   --   --   --   --  1.5*  --   PHOS 3.4  --   --   --   --   --   --   --   --    < > = values in this interval not displayed.    Liver Function Tests: Recent Labs  Lab 03/11/20 1435 03/12/20 0500 03/13/20 0137 03/15/20 0300  AST 27 35  --  56*  ALT 16 21  --  26  ALKPHOS 131* 110  --  212*  BILITOT 3.4* 2.9*  --  4.2*  PROT 6.8 6.2*  --  6.0*  ALBUMIN 3.1* 3.0* 2.9* 2.8*  No results for input(s): LIPASE, AMYLASE in the last 168 hours. Recent Labs  Lab 03/11/20 1031  AMMONIA 18    CBC: Recent Labs  Lab 03/11/20 1031 03/11/20 1031 03/11/20 1320 03/12/20 0500 03/13/20 0137 03/15/20 0300 03/16/20 0316  WBC 7.2  --   --  6.7 6.5 6.4 6.0  NEUTROABS 5.6  --   --   --   --   --   --   HGB 16.1*   < > 17.7* 13.9 13.3 13.1 12.9  HCT 51.2*   < > 52.0* 43.3 40.7 40.9 39.6  MCV 98.1  --   --  95.6 94.9 95.3 94.5  PLT 251  --   --  203 227 209 188   < > = values in this interval not displayed.    Cardiac Enzymes: Recent Labs  Lab 03/11/20 1435  CKTOTAL 243*    BNP: BNP (last 3 results) No results for input(s): BNP in the last 8760 hours.  ProBNP (last 3 results) No results for input(s): PROBNP in the last 8760 hours.    Other results:  Imaging: DG Chest 1 View  Result Date: 03/14/2020 CLINICAL DATA:  Central line placement. EXAM: CHEST  1 VIEW COMPARISON:  Radiograph 03/11/2020 FINDINGS: Tip of the right internal jugular central venous catheter in the region of the atrial caval junction. No pneumothorax. Right greater than left pleural effusions and adjacent compressive atelectasis. Slight  increase in pleural fluid since prior exam. Cardiomegaly is not significantly changed. Interstitial opacities typical of pulmonary edema. Streaky atelectasis in the right mid lung. No acute osseous abnormalities are seen. IMPRESSION: 1. Tip of the right internal jugular central venous catheter in the region of the atrial caval junction. No pneumothorax. 2. Right greater than left pleural effusions with adjacent compressive atelectasis, increased from prior. Cardiomegaly is unchanged. Pulmonary edema. Electronically Signed   By: Narda Rutherford M.D.   On: 03/14/2020 17:18     Medications:     Scheduled Medications: . aspirin EC  81 mg Oral Daily  . atorvastatin  40 mg Oral QPM  . Chlorhexidine Gluconate Cloth  6 each Topical Daily  . clopidogrel  75 mg Oral Daily  . insulin aspart  0-15 Units Subcutaneous Q4H  . insulin glargine  5 Units Subcutaneous QHS  . sodium chloride flush  10-40 mL Intracatheter Q12H  . spironolactone  12.5 mg Oral Daily    Infusions: . milrinone 0.125 mcg/kg/min (03/16/20 1200)    PRN Medications: acetaminophen **OR** acetaminophen, ondansetron **OR** ondansetron (ZOFRAN) IV, polyethylene glycol, sodium chloride flush  Plan/Discussion:    1. Acute on Chronic Systolic Heart Failure>> Cardiogenic Shock  - Echo 2016 EF 30-35%, RV moderately reduced. LHC showed mild nonobstructive CAD - Echo this admit shows EF 20-25%, RV severely reduced, severe LVH and thicken valves concerning for AL amyloid. Echo suggestive of cardiac amyloidosis but PYP is negative for TTR amyloid. 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  - shock resolved with milrinone. Co-ox 76%  CVP 5  - Can decrease milrinone to 0.125. Stop IV lasix - Continue spiro - BP too soft for Entresto/arb at this time. Will defer digoxin given problems with compliance in past  Possibly dig and losartan  tomorrow pending renal function. Add bidil as tolerated - no ? blocker w/ shock  - plan cMRI tomorrow if she can cooperate enough - SPEP negative.  Kappa light chains mildly elevated. Suspect non-specific. If MRI c/w infiltrative pattern will need fat pad biopsy but I doubt this is AL amyloid - likely not candidate for advanced therapies given comorbidities. Not sure how she can be managed well at home. Is she candidate for palliative care?   2. 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 IR consulted w/ plans to perform cerebral angiogram today.  - no intracardiac source of embolus noted on echo - no afib detection on tele  - management per neuro and IR - Pt/OT  3. AKI - SCr on admit was 1.43, peaked at 1.8. Now down to 1.4 with milrinone - likely due to cardiogenic shock/ cardiorenal  - continue inotropic support  4. IDDD - management per primary team  - on SSI   5. CAD - mild nonobstructive CAD by cath in 2016, w/  10-20% LAD and 20% LCx disease, RCA was small, nondominant w/ no significant coronary plaque - medical management  - no s/s angina    Length of Stay: 5   Arvilla Meres MD 03/16/2020, 12:47 PM  Advanced Heart Failure Team Pager [phone] (M-F; 7a - 4p)  Please contact [facility] for night-coverage after hours (4p -7a ) and weekends on [email or web address]

## 2020-03-16 NOTE — Progress Notes (Signed)
Pt CBG 36, symptomatic with lethargy and slurred speech but arousal. Hemodynamically stable  Intervention: amp D50 (25g)    Repeat CBG 206  Will continue to monitor pt closely

## 2020-03-17 ENCOUNTER — Other Ambulatory Visit (HOSPITAL_COMMUNITY): Payer: BC Managed Care – PPO

## 2020-03-17 ENCOUNTER — Encounter (HOSPITAL_COMMUNITY)
Admission: EM | Disposition: A | Discharge status: Skilled Nursing Facility | Payer: Self-pay | Source: Home / Self Care | Attending: Student in an Organized Health Care Education/Training Program

## 2020-03-17 ENCOUNTER — Ambulatory Visit (HOSPITAL_COMMUNITY): Admit: 2020-03-17 | Payer: BC Managed Care – PPO | Admitting: Internal Medicine

## 2020-03-17 ENCOUNTER — Inpatient Hospital Stay (HOSPITAL_COMMUNITY): Payer: BC Managed Care – PPO

## 2020-03-17 DIAGNOSIS — I361 Nonrheumatic tricuspid (valve) insufficiency: Secondary | ICD-10-CM

## 2020-03-17 DIAGNOSIS — R570 Cardiogenic shock: Secondary | ICD-10-CM | POA: Diagnosis not present

## 2020-03-17 DIAGNOSIS — I6330 Cerebral infarction due to thrombosis of unspecified cerebral artery: Secondary | ICD-10-CM | POA: Diagnosis not present

## 2020-03-17 DIAGNOSIS — I340 Nonrheumatic mitral (valve) insufficiency: Secondary | ICD-10-CM

## 2020-03-17 DIAGNOSIS — R339 Retention of urine, unspecified: Secondary | ICD-10-CM

## 2020-03-17 DIAGNOSIS — G9341 Metabolic encephalopathy: Secondary | ICD-10-CM

## 2020-03-17 DIAGNOSIS — J90 Pleural effusion, not elsewhere classified: Secondary | ICD-10-CM

## 2020-03-17 DIAGNOSIS — I5023 Acute on chronic systolic (congestive) heart failure: Secondary | ICD-10-CM | POA: Diagnosis not present

## 2020-03-17 LAB — IMMUNOFIXATION ELECTROPHORESIS
IgA: 275 mg/dL (ref 87–352)
IgG (Immunoglobin G), Serum: 1340 mg/dL (ref 586–1602)
IgM (Immunoglobulin M), Srm: 75 mg/dL (ref 26–217)
Total Protein ELP: 6.6 g/dL (ref 6.0–8.5)

## 2020-03-17 LAB — GLUCOSE, CAPILLARY
Glucose-Capillary: 107 mg/dL — ABNORMAL HIGH (ref 70–99)
Glucose-Capillary: 112 mg/dL — ABNORMAL HIGH (ref 70–99)
Glucose-Capillary: 131 mg/dL — ABNORMAL HIGH (ref 70–99)
Glucose-Capillary: 147 mg/dL — ABNORMAL HIGH (ref 70–99)
Glucose-Capillary: 257 mg/dL — ABNORMAL HIGH (ref 70–99)
Glucose-Capillary: 264 mg/dL — ABNORMAL HIGH (ref 70–99)

## 2020-03-17 LAB — BASIC METABOLIC PANEL
Anion gap: 9 (ref 5–15)
BUN: 21 mg/dL — ABNORMAL HIGH (ref 6–20)
CO2: 31 mmol/L (ref 22–32)
Calcium: 8.4 mg/dL — ABNORMAL LOW (ref 8.9–10.3)
Chloride: 97 mmol/L — ABNORMAL LOW (ref 98–111)
Creatinine, Ser: 1.42 mg/dL — ABNORMAL HIGH (ref 0.44–1.00)
GFR, Estimated: 43 mL/min — ABNORMAL LOW (ref >60)
Glucose, Bld: 166 mg/dL — ABNORMAL HIGH (ref 70–99)
Potassium: 4 mmol/L (ref 3.5–5.1)
Sodium: 137 mmol/L (ref 135–145)

## 2020-03-17 LAB — COOXEMETRY PANEL
Carboxyhemoglobin: 1.4 % (ref 0.5–1.5)
Carboxyhemoglobin: 1.2 % (ref 0.5–1.5)
Methemoglobin: 0.8 % (ref 0.0–1.5)
Methemoglobin: 0.6 % (ref 0.0–1.5)
O2 Saturation: 51.7 %
O2 Saturation: 49.6 %
Total hemoglobin: 12.6 g/dL (ref 12.0–16.0)
Total hemoglobin: 12.4 g/dL (ref 12.0–16.0)

## 2020-03-17 IMAGING — MR MR CARD MORPHOLOGY WO/W CM
46 of 48 series · 46 of 48 positions shown · IV contrast (Contrast agent)
Comparison: none

CLINICAL DATA: Nonischemic cardiomyopathy

EXAM:
CARDIAC MRI
TECHNIQUE: The patient was scanned on a 1.5 Tesla GE magnet. A dedicated
cardiac coil was used. Functional imaging was done using Fiesta
sequences. [DATE], and 4 chamber views were done to assess for RWMA's.
Modified FENELUS rule using a short axis stack was used to
calculate an ejection fraction on a dedicated work station using
Circle software. The patient received 9 cc of Gadavist. After 10
minutes inversion recovery sequences were used to assess for
infiltration and scar tissue.

[Series 4: [id number] · axial · 8.0mm · 1.41mm/px · 1 of 16 slices shown]
[im 1/16]
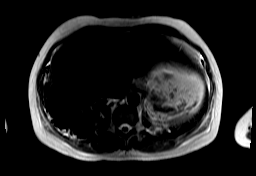

[Series 8: bSSFP · oblique · 8.0mm · 1.61mm/px · 1 of 25 slices shown (1 of 19)]
[im 1/25]
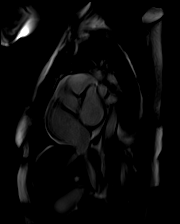

[Series 9: bSSFP · oblique · 8.0mm · 1.61mm/px · 1 of 25 slices shown (2 of 19)]
[im 1/25]
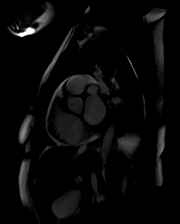

[Series 10: bSSFP · oblique · 8.0mm · 1.61mm/px · 1 of 25 slices shown (3 of 19)]
[im 1/25]
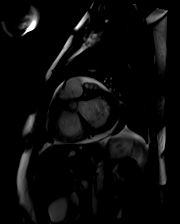

[Series 11: bSSFP · oblique · 8.0mm · 1.61mm/px · 1 of 25 slices shown (4 of 19)]
[im 1/25]
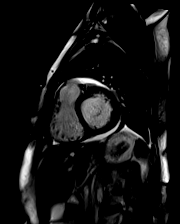

[Series 12: bSSFP · oblique · 8.0mm · 1.61mm/px · 1 of 25 slices shown (5 of 19)]
[im 1/25]
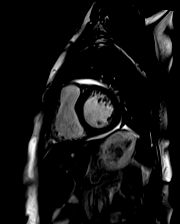

[Series 13: bSSFP · oblique · 8.0mm · 1.61mm/px · 1 of 25 slices shown (6 of 19)]
[im 1/25]
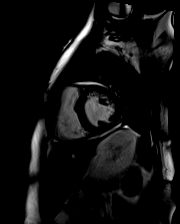

[Series 14: bSSFP · oblique · 8.0mm · 1.61mm/px · 1 of 25 slices shown (7 of 19)]
[im 1/25]
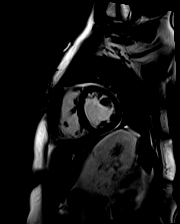

[Series 15: bSSFP · oblique · 8.0mm · 1.61mm/px · 1 of 25 slices shown (8 of 19)]
[im 1/25]
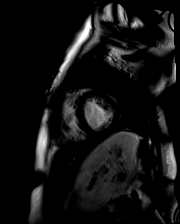

[Series 16: bSSFP · oblique · 8.0mm · 1.61mm/px · 1 of 25 slices shown (9 of 19)]
[im 1/25]
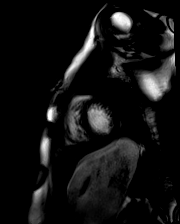

[Series 17: bSSFP · oblique · 8.0mm · 1.61mm/px · 1 of 25 slices shown (10 of 19)]
[im 1/25]
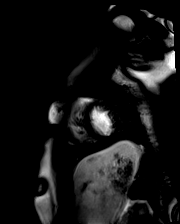

[Series 18: bSSFP · oblique · 8.0mm · 1.61mm/px · 1 of 25 slices shown (11 of 19)]
[im 1/25]
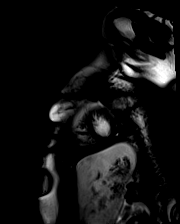

[Series 19: bSSFP · oblique · 8.0mm · 1.61mm/px · 1 of 25 slices shown (12 of 19)]
[im 1/25]
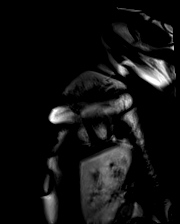

[Series 20: bSSFP · oblique · 8.0mm · 1.61mm/px · 1 of 25 slices shown (13 of 19)]
[im 1/25]
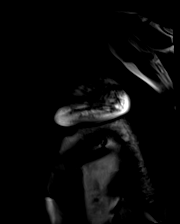

[Series 21: bSSFP · oblique · 8.0mm · 1.61mm/px · 1 of 25 slices shown (14 of 19)]
[im 1/25]
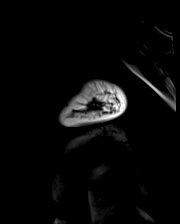

[Series 22: bSSFP · oblique · 8.0mm · 1.61mm/px · 1 of 25 slices shown (15 of 19)]
[im 1/25]
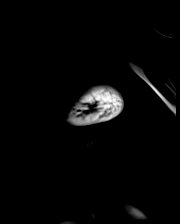

[Series 23: bSSFP · oblique · 8.0mm · 1.61mm/px · 1 of 25 slices shown (16 of 19)]
[im 1/25]
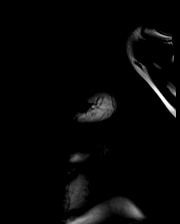

[Series 24: cine_trufi_cs_rt_short axis · oblique · 8.0mm · 1.73mm/px · 1 of 10 slices shown (1 of 17)]
[im 1/10]
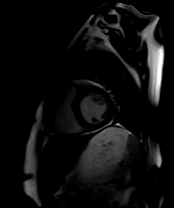

[Series 24: cine_trufi_cs_rt_short axis · oblique · 8.0mm · 1.73mm/px · 1 of 10 slices shown (2 of 17)]
[im 1/10]
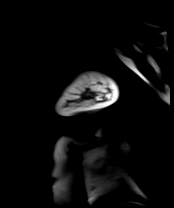

[Series 24: cine_trufi_cs_rt_short axis · oblique · 8.0mm · 1.73mm/px · 1 of 10 slices shown (3 of 17)]
[im 1/10]
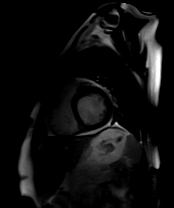

[Series 24: cine_trufi_cs_rt_short axis · oblique · 8.0mm · 1.73mm/px · 1 of 10 slices shown (4 of 17)]
[im 1/10]
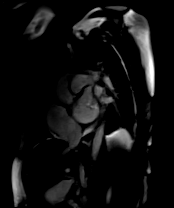

[Series 24: cine_trufi_cs_rt_short axis · oblique · 8.0mm · 1.73mm/px · 1 of 10 slices shown (5 of 17)]
[im 1/10]
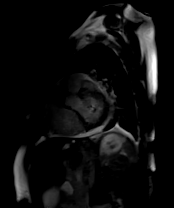

[Series 24: cine_trufi_cs_rt_short axis · oblique · 8.0mm · 1.73mm/px · 1 of 10 slices shown (6 of 17)]
[im 1/10]
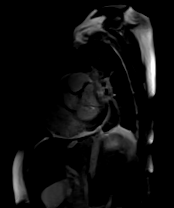

[Series 24: cine_trufi_cs_rt_short axis · oblique · 8.0mm · 1.73mm/px · 1 of 10 slices shown (7 of 17)]
[im 1/10]
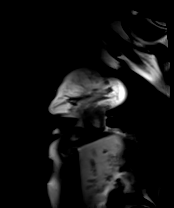

[Series 24: cine_trufi_cs_rt_short axis · oblique · 8.0mm · 1.73mm/px · 1 of 10 slices shown (8 of 17)]
[im 1/10]
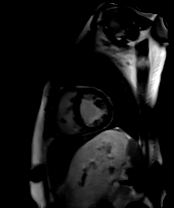

[Series 24: cine_trufi_cs_rt_short axis · oblique · 8.0mm · 1.73mm/px · 1 of 10 slices shown (9 of 17)]
[im 1/10]
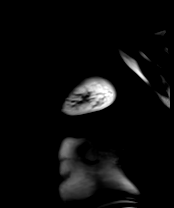

[Series 24: cine_trufi_cs_rt_short axis · oblique · 8.0mm · 1.73mm/px · 1 of 10 slices shown (10 of 17)]
[im 1/10]
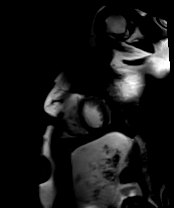

[Series 24: cine_trufi_cs_rt_short axis · oblique · 8.0mm · 1.73mm/px · 1 of 10 slices shown (11 of 17)]
[im 1/10]
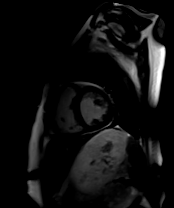

[Series 24: cine_trufi_cs_rt_short axis · oblique · 8.0mm · 1.73mm/px · 1 of 10 slices shown (12 of 17)]
[im 1/10]
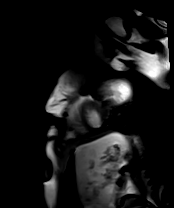

[Series 24: cine_trufi_cs_rt_short axis · oblique · 8.0mm · 1.73mm/px · 1 of 10 slices shown (13 of 17)]
[im 1/10]
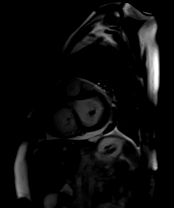

[Series 24: cine_trufi_cs_rt_short axis · oblique · 8.0mm · 1.73mm/px · 1 of 10 slices shown (14 of 17)]
[im 1/10]
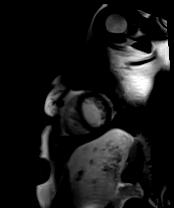

[Series 24: cine_trufi_cs_rt_short axis · oblique · 8.0mm · 1.73mm/px · 1 of 10 slices shown (15 of 17)]
[im 1/10]
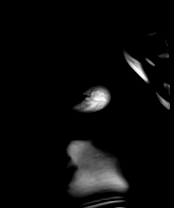

[Series 24: cine_trufi_cs_rt_short axis · oblique · 8.0mm · 1.73mm/px · 1 of 10 slices shown (16 of 17)]
[im 1/10]
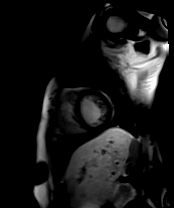

[Series 24: cine_trufi_cs_rt_short axis · oblique · 8.0mm · 1.73mm/px · 1 of 10 slices shown (17 of 17)]
[im 1/10]
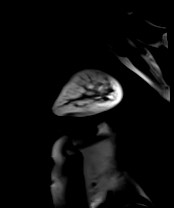

[Series 25: bSSFP · oblique · 6.0mm · 1.41mm/px · 1 of 25 slices shown (17 of 19)]
[im 1/25]
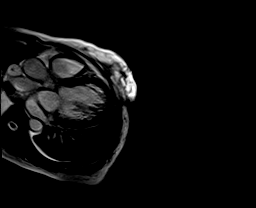

[Series 26: bSSFP · coronal · 6.0mm · 1.41mm/px · 1 of 25 slices shown (18 of 19)]
[im 1/25]
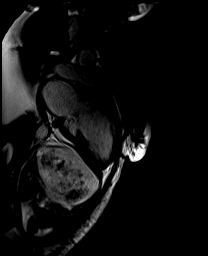

[Series 27: bSSFP · oblique · 6.0mm · 1.41mm/px · 1 of 25 slices shown (19 of 19)]
[im 1/25]
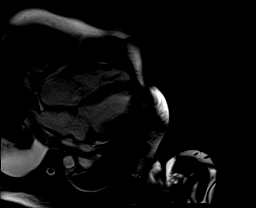

[Series 28: (id)_long_t1 · oblique · 8.0mm · 1.56mm/px · 1 of 24 slices shown]
[im 1/24]
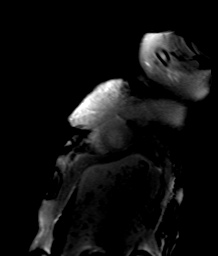

[Series 29: (id)_long_t1_moco · oblique · 8.0mm · 1.56mm/px · 1 of 24 slices shown]
[im 1/24]
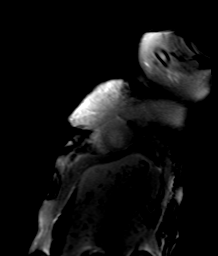

[Series 32: (id)_trufi · oblique · 8.0mm · 2.08mm/px · 1 of 9 slices shown]
[im 1/9]
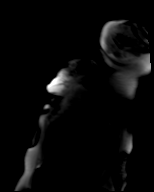

[Series 33: (id)_trufi_moco · oblique · 8.0mm · 2.08mm/px · 1 of 9 slices shown]
[im 1/9]
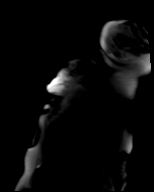

[Series 36: pre short axis · oblique · non-contrast · 8.0mm · 2.25mm/px · 1 of 10 slices shown (1 of 5)]
[im 1/10]
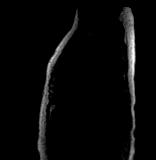

[Series 37: pre short axis · oblique · non-contrast · 8.0mm · 2.25mm/px · 1 of 10 slices shown (2 of 5)]
[im 1/10]
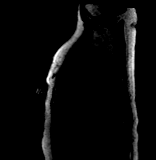

[Series 38: pre short axis · oblique · non-contrast · 8.0mm · 2.25mm/px · 1 of 10 slices shown (3 of 5)]
[im 1/10]
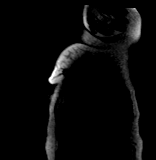

[Series 39: pre short axis · oblique · non-contrast · 8.0mm · 2.25mm/px · 1 of 10 slices shown (4 of 5)]
[im 1/10]
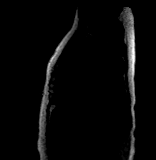

[Series 40: pre short axis · oblique · non-contrast · 8.0mm · 2.25mm/px · 1 of 10 slices shown (5 of 5)]
[im 1/10]
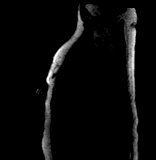

[46 of 48 positions shown; findings below may reference images not displayed]

FINDINGS: Moderate right pleural effusion.  Trivial pericardial effusion.

Normal left ventricular size with mild LV hypertrophy (not severe as
reported on echo). Diffuse LV hypokinesis with EF 30%. Mildly
dilated right ventricle, no significant RV hypertrophy. Mildly
decreased RV systolic function, EF 42%. Mild biatrial enlargement.
Trileaflet aortic valve, no significant regurgitation or stenosis.
Moderate-severe mitral regurgitation (flow sequences to confirm were
not done). Severe tricuspid regurgitation.

Delayed enhancement imaging: extensive basal to mid septal,
inferior, and lateral mid-myocardial late gadolinium enhancement
(LGE) with relative sparing of the apex and anterior wall.

Measurements:

LVEDV 148 mL
LVSV 45 mL
LVEF 30%

RVEDV 189 mL
RVSV 78 mL
RVEF 42%

Extracellular volume percentage: 33%
IMPRESSION: 1.  Moderate right pleural effusion.

2. Normal LV size with mild LV hypertrophy, EF 30% with diffuse
hypokinesis.

3. Mildly dilated RV without significant [REDACTED], EF 42% (mildly
decreased).

4. Moderate-severe MR and severe TR, flow sequences to quantify were
not done.

5. Non-coronary LGE pattern with extensive mid-myocardial scar in
the basal to mid septal, inferior, and lateral walls. This could be
consistent with prior myocarditis versus cardiac amyloidosis. Wall
thickness is not particularly impressive for amyloidosis and ECV
percentage is only 33% (mildly elevated, often see higher with
amyloidosis).

FENELUS

## 2020-03-17 SURGERY — ECHOCARDIOGRAM, TRANSESOPHAGEAL
Anesthesia: Monitor Anesthesia Care

## 2020-03-17 MED ORDER — FUROSEMIDE 40 MG PO TABS
40.0000 mg | ORAL_TABLET | Freq: Every day | ORAL | Status: DC
  Administered 2020-03-17 – 2020-03-19 (×3): 40 mg via ORAL
  Filled 2020-03-17 (×3): qty 1

## 2020-03-17 MED ORDER — GADOBUTROL 1 MMOL/ML IV SOLN
6.0000 mL | Freq: Once | INTRAVENOUS | Status: AC | PRN
  Administered 2020-03-17: 6 mL via INTRAVENOUS

## 2020-03-17 MED ORDER — LOSARTAN POTASSIUM 25 MG PO TABS
12.5000 mg | ORAL_TABLET | Freq: Two times a day (BID) | ORAL | Status: DC
  Administered 2020-03-17 (×2): 12.5 mg via ORAL
  Filled 2020-03-17: qty 1

## 2020-03-17 MED ORDER — ENOXAPARIN SODIUM 40 MG/0.4ML ~~LOC~~ SOLN
40.0000 mg | SUBCUTANEOUS | Status: DC
  Administered 2020-03-17 – 2020-03-19 (×3): 40 mg via SUBCUTANEOUS
  Filled 2020-03-17 (×3): qty 0.4

## 2020-03-17 NOTE — TOC Initial Note (Signed)
Transition of Care [facility]) - Initial/Assessment Note    Patient Details  Name: Christine Benitez MRN: [id number] Date of Birth: 05-Nov-1960  Transition of Care [facility]) CM/SW Contact:    Carmina Miller, LCSWA Phone Number: 03/17/2020, 4:45 PM  Clinical Narrative:                 CSW received consult for possible SNF placement at time of discharge. CSW spoke with patient's mother [name] due to patient's ongoing confusion regarding PT recommendation of SNF placement at time of discharge. [name] reported that patient lives alone and is currently unable to care for herself at home given patient's current physical needs and fall risk. [name] expressed understanding of PT recommendation and is agreeable to explore SNF placement options at time of discharge but would also like to see what possible Home Health services may be available. [name] reports preference for the [place] area. CSW discussed insurance authorization process and provided Medicare SNF ratings list. Patient has not received the COVID vaccines. [name] expressed being hopeful for rehab for patient and to feel better soon. No further questions reported at this time. CSW to continue to follow and assist with discharge planning needs.        Patient Goals and CMS Choice        Expected Discharge Plan and Services                                                Prior Living Arrangements/Services                       Activities of Daily Living Home Assistive Devices/Equipment: None ADL Screening (condition at time of admission) Patient's cognitive ability adequate to safely complete daily activities?: No Is the patient deaf or have difficulty hearing?: Yes Does the patient have difficulty seeing, even when wearing glasses/contacts?: No Does the patient have difficulty concentrating, remembering, or making decisions?: Yes Patient able to express need for assistance with ADLs?: No Does the patient have difficulty  dressing or bathing?: Yes Independently performs ADLs?: No Communication: Independent Does the patient have difficulty walking or climbing stairs?: No Weakness of Legs: Both Weakness of Arms/Hands: Both  Permission Sought/Granted                  Emotional Assessment              Admission diagnosis:  Altered mental status [R41.82] Pain [R52] Hyperglycemia [R73.9] AKI (acute kidney injury) (HCC) [N17.9] Elevated bilirubin [R17] Heart failure (HCC) [I50.9] Encounter for imaging to screen for metal prior to magnetic resonance imaging (MRI) [Z13.89] Altered mental status, unspecified altered mental status type [R41.82] Community acquired pneumonia, unspecified laterality [J18.9] Right thalamic infarction [facility]) [I63.9] Patient Active Problem List   Diagnosis Date Noted  . AKI (acute kidney injury) (HCC)   . Cardiogenic shock (HCC) 03/14/2020  . Acute urinary retention 03/13/2020  . Chronic cholecystitis with calculus 03/12/2020  . Cerebral thrombosis with cerebral infarction 03/12/2020  . Abnormal LFTs   . Hypertension   . NICM (nonischemic cardiomyopathy) (HCC)   . Pulmonary hypertension (HCC)   . Tobacco abuse 06/10/2014  . Diabetes mellitus (HCC) 06/10/2014  . Family history of premature CAD 06/10/2014   PCP:  Ailene Ravel, MD Pharmacy:   [facility] [phone] - [address] [place] - [address] AT  [facility] [address] [place] [address] Phone: [phone] Fax: [phone]     Social Determinants of Health (SDOH) Interventions    Readmission Risk Interventions No flowsheet data found.

## 2020-03-17 NOTE — Progress Notes (Signed)
  Speech Language Pathology Treatment: Dysphagia;Cognitive-Linquistic  Patient Details Name: Christine Benitez MRN: [id number] DOB: October 17, 1960 Today's Date: 03/17/2020 Time: 3300-7622 SLP Time Calculation (min) (ACUTE ONLY): 17 min  Assessment / Plan / Recommendation Clinical Impression  SLP orders were accidentally cancelled by RN and were therefore replaced; however, SLP has been follwoing pt since 11/4. Pt's RN reported that the pt has been tolerating the current diet without difficulty. Pt indicated that she does not like the meats to be cut up like they have been. Per the pt, her dentures were brought to the hospital but they "have gone missing or something" and she typically does not eat without her dentures. Dentures were not found thin the pt's room by this SLP/her RN. Pt's mastication of dysphagia 3 solids was prolonged with 3-4 minutes spent masticating peaches. Mastication was ineffective and peaches were still whole upon oral inspection. It is recommended that the pt's current diet continued. Pt's awareness of cognitive impairments was improved. She recalled working with the SLP on 11/7 and indicated that did not perform well. She expressed, "I don't know if it was him, or if it was me, or if was him and me, but I didn't do good." Pt also reported having difficulty with confusion and memory since admission. However, when SLP attempted to target these areas, she reported that she just wanted to rest and the session was therefore terminated prematurely. SLP will continue to follow pt.    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 Pt is a poor historian and family was not present during BSE.  Decreased alertness level  noted with HOH status exacerbating cognitive deficits.      SLP Plan  Continue with current plan of care       Recommendations  Diet recommendations: Dysphagia 2 (fine chop);Thin liquid Liquids provided via: Cup;Straw Medication Administration: Crushed with puree Supervision: Staff to assist with self feeding Compensations: Slow rate;Small sips/bites Postural Changes and/or Swallow Maneuvers: Seated upright 90 degrees                Oral Care Recommendations: Oral care BID Follow up Recommendations: Skilled Nursing facility SLP Visit Diagnosis: Cognitive communication deficit (R41.841);Dysphagia, unspecified (R13.10) Plan: Continue with current plan of care       Christine Benitez I. Vear Clock, MS, CCC-SLP Acute Rehabilitation Services Office number [phone] Pager [phone]                Christine Benitez 03/17/2020, 9:58 AM

## 2020-03-17 NOTE — Progress Notes (Signed)
Inpatient Diabetes Program Recommendations  AACE/ADA: New Consensus Statement on Inpatient Glycemic Control (2015)  Target Ranges:  Prepandial:   less than 140 mg/dL      Peak postprandial:   less than 180 mg/dL (1-2 hours)      Critically ill patients:  140 - 180 mg/dL   Lab Results  Component Value Date   GLUCAP 112 (H) 03/17/2020   HGBA1C 7.3 (H) 03/12/2020    Review of Glycemic Control Results for Christine Benitez, Christine Benitez (MRN [id number]) as of 03/17/2020 10:05  Ref. Range 03/16/2020 07:47 03/16/2020 11:14 03/16/2020 15:20 03/16/2020 20:27 03/16/2020 23:57 03/17/2020 04:25 03/17/2020 06:41  Glucose-Capillary Latest Ref Range: 70 - 99 mg/dL 878 (H) 676 (H) 720 (H) 255 (H) 279 (H) 131 (H) 112 (H)   Diabetes history: DM 2 Outpatient Diabetes medications:  Humalog 75/25 20 units AM and 30 units q PM Metformin 500 mg bid Current orders for Inpatient glycemic control:  Novolog moderate q 4 hours Lantus 5 units q HS  Inpatient Diabetes Program Recommendations:    Please consider changing Novolog correction to tid with meals and HS scale (patient received a total of 16 units of Novolog last night at 2000 and midnight). Once correction changed to with meals only, consider increasing Lantus to 10 units q HS. Also please consider adding Novolog meal coverage 2 units tid with meals (hold if patient eats less than 50%).  Thanks,  Beryl Meager, RN, BC-ADM Inpatient Diabetes Coordinator Pager [phone]

## 2020-03-17 NOTE — Plan of Care (Signed)

## 2020-03-17 NOTE — NC FL2 (Signed)
[place] MEDICAID FL2 LEVEL OF CARE SCREENING TOOL     IDENTIFICATION  Patient Name: Christine Benitez Birthdate: 10-09-1960 Sex: female Admission Date (Current Location): 03/11/2020  [facility] and [place] Number:  [employer] and Address:  [facility], [address] [place], [place] [address]      Provider Number: [id number]  Attending Physician Name and Address:  Tyson Alias, *  Relative Name and Phone Number:  [name] [phone]    Current Level of Care: Hospital Recommended Level of Care: Skilled Nursing Facility Prior Approval Number:    Date Approved/Denied:   PASRR Number: [id number]  Discharge Plan: SNF    Current Diagnoses: Patient Active Problem List   Diagnosis Date Noted  . AKI (acute kidney injury) (HCC)   . Cardiogenic shock (HCC) 03/14/2020  . Acute urinary retention 03/13/2020  . Chronic cholecystitis with calculus 03/12/2020  . Cerebral thrombosis with cerebral infarction 03/12/2020  . Abnormal LFTs   . Hypertension   . NICM (nonischemic cardiomyopathy) (HCC)   . Pulmonary hypertension (HCC)   . Tobacco abuse 06/10/2014  . Diabetes mellitus (HCC) 06/10/2014  . Family history of premature CAD 06/10/2014    Orientation RESPIRATION BLADDER Height & Weight     Self, Situation, Place  Normal Incontinent, Indwelling catheter Weight: 109 lb 5.6 oz (49.6 kg) Height:  5\' 4"  (162.6 cm)  BEHAVIORAL SYMPTOMS/MOOD NEUROLOGICAL BOWEL NUTRITION STATUS      Continent Diet (see dc summary)  AMBULATORY STATUS COMMUNICATION OF NEEDS Skin   Limited Assist Verbally Normal                       Personal Care Assistance Level of Assistance  Bathing, Feeding, Dressing Bathing Assistance: Limited assistance Feeding assistance: Limited assistance Dressing Assistance: Limited assistance     Functional Limitations Info  Sight, Hearing, Speech Sight Info: Adequate Hearing Info: Impaired Speech Info:  Adequate    SPECIAL CARE FACTORS FREQUENCY  PT (By licensed PT), OT (By licensed OT)     PT Frequency: 5x week OT Frequency: 5x week            Contractures Contractures Info: Not present    Additional Factors Info  Code Status, Allergies, Insulin Sliding Scale Code Status Info: Full Allergies Info: NKA   Insulin Sliding Scale Info: insulin aspart (novoLOG) injection 0-15 Units every 4 hours, insulin glargine (LANTUS) injection 5 Units at bedtime,       Current Medications (03/17/2020):  This is the current hospital active medication list Current Facility-Administered Medications  Medication Dose Route Frequency Provider Last Rate Last Admin  . acetaminophen (TYLENOL) tablet 650 mg  650 mg Oral Q6H PRN 13/12/2019, PA-C   650 mg at 03/17/20 13/08/21   Or  . acetaminophen (TYLENOL) suppository 650 mg  650 mg Rectal Q6H PRN 7939 M, PA-C      . aspirin EC tablet 81 mg  81 mg Oral Daily Aslam, M, MD   81 mg at 03/17/20 0801  . atorvastatin (LIPITOR) tablet 40 mg  40 mg Oral QPM 13/08/21 M, PA-C   40 mg at 03/16/20 1757  . Chlorhexidine Gluconate Cloth 2 % PADS 6 each  6 each Topical Daily 13/07/21, PA-C   6 each at 03/17/20 0930  . clopidogrel (PLAVIX) tablet 75 mg  75 mg Oral Daily 13/08/21 M, PA-C   75 mg at 03/17/20 0800  . enoxaparin (LOVENOX) injection  40 mg  40 mg Subcutaneous Q24H Evlyn Kanner, MD      . furosemide (LASIX) tablet 40 mg  40 mg Oral Daily Bensimhon, Bevelyn Buckles, MD   40 mg at 03/17/20 0859  . insulin aspart (novoLOG) injection 0-15 Units  0-15 Units Subcutaneous Q4H Evlyn Kanner, MD   8 Units at 03/17/20 1615  . insulin glargine (LANTUS) injection 5 Units  5 Units Subcutaneous QHS Evlyn Kanner, MD   5 Units at 03/16/20 2213  . losartan (COZAAR) tablet 12.5 mg  12.5 mg Oral BID Bensimhon, Bevelyn Buckles, MD   12.5 mg at 03/17/20 0859  . milrinone (PRIMACOR) 20 MG/100 ML (0.2 mg/mL) infusion  0.125  mcg/kg/min Intravenous Continuous Bensimhon, Bevelyn Buckles, MD 1.89 mL/hr at 03/17/20 1200 0.125 mcg/kg/min at 03/17/20 1200  . ondansetron (ZOFRAN) tablet 4 mg  4 mg Oral Q6H PRN Robbie Lis M, PA-C       Or  . ondansetron [facility]) injection 4 mg  4 mg Intravenous Q6H PRN Robbie Lis M, PA-C      . polyethylene glycol (MIRALAX / GLYCOLAX) packet 17 g  17 g Oral Daily PRN Robbie Lis M, PA-C      . sodium chloride flush (NS) 0.9 % injection 10-40 mL  10-40 mL Intracatheter Q12H Bensimhon, Bevelyn Buckles, MD   10 mL at 03/17/20 0803  . sodium chloride flush (NS) 0.9 % injection 10-40 mL  10-40 mL Intracatheter PRN Bensimhon, Bevelyn Buckles, MD      . spironolactone (ALDACTONE) tablet 12.5 mg  12.5 mg Oral Daily Bensimhon, Bevelyn Buckles, MD   12.5 mg at 03/17/20 0800     Discharge Medications: Please see discharge summary for a list of discharge medications.  Relevant Imaging Results:  Relevant Lab Results:   Additional Information SSN [id number]  Carmina Miller, LCSWA

## 2020-03-17 NOTE — Progress Notes (Signed)
NIR.  Patient was scheduled for an image-guided diagnostic cerebral arteriogram tentatively for today in IR with Dr. Corliss Skains.  Spoke with Dr. Marijo Conception regarding timing of above procedure. At this time, patient currently undergoing work-up for HF/cardiogenic shock, with plans for possible palliative consult in near future- no plans for NIR procedure at this time. Will delete order- Dr. Marijo Conception aware to re-consult NIR if procedure desired in future.  Please call NIR with questions/concerns.   Waylan Boga Shadaya Marschner, PA-C 03/17/2020, 11:31 AM

## 2020-03-17 NOTE — Progress Notes (Signed)
Advanced Heart Failure Rounding Note   Subjective:    Central access placed 11/5. Co-ox 45%. Started on milrinone.   Milrinone decreased from 0.25 to 0.125 yesterday. Co-ox back down to 50%  Poor historian. Denies CP or SOB. CVP 8-9   Objective:   Weight Range:  Vital Signs:   Temp:  [98.4 F (36.9 C)-98.6 F (37 C)] 98.4 F (36.9 C) (11/08 0400) Pulse Rate:  [79-103] 103 (11/08 0500) Resp:  [12-31] 26 (11/08 0700) BP: (82-128)/(43-88) 115/77 (11/08 0700) SpO2:  [91 %-100 %] 100 % (11/08 0500) Weight:  [49.6 kg] 49.6 kg (11/08 0500) Last BM Date: 03/14/20  Weight change: Filed Weights   03/15/20 0500 03/16/20 0500 03/17/20 0500  Weight: 47.9 kg 50.3 kg 49.6 kg    Intake/Output:   Intake/Output Summary (Last 24 hours) at 03/17/2020 0807 Last data filed at 03/17/2020 0700 Gross per 24 hour  Intake 219.48 ml  Output 1305 ml  Net -1085.52 ml     Physical Exam: General:  Lying in bed. Weak appearing. No resp difficulty HEENT: normal x R ptosis Neck: supple. JVP 8-9 Carotids 2+ bilat; no bruits. No lymphadenopathy or thryomegaly appreciated. Cor: PMI nondisplaced. Regular rate & rhythm. + s3 Lungs: clear Abdomen: soft, nontender, nondistended. No hepatosplenomegaly. No bruits or masses. Good bowel sounds. Extremities: no cyanosis, clubbing, rash, edema Neuro: alert. Oriented x 2 very HOH. Weak on left    Telemetry: sinus 90-105 Personally reviewed   Labs: Basic Metabolic Panel: Recent Labs  Lab 03/13/20 0137 03/14/20 0554 03/14/20 2108 03/14/20 2108 03/15/20 0300 03/15/20 0300 03/15/20 2205 03/16/20 0316 03/17/20 0345  NA 138   < > 139  --  140  --  138 138 137  K 3.6   < > 3.2*  --  4.3  --  3.3* 3.7 4.0  CL 99   < > 100  --  101  --  99 98 97*  CO2 21*   < > 25  --  26  --  29 30 31   GLUCOSE 139*   < > 175*  --  185*  --  121* 41* 166*  BUN 30*   < > 35*  --  31*  --  28* 25* 21*  CREATININE 1.65*   < > 1.76*  --  1.79*  --  1.61* 1.44*  1.42*  CALCIUM 8.3*   < > 8.4*   < > 8.5*   < > 7.9* 8.1* 8.4*  MG  --   --   --   --   --   --  1.5*  --   --   PHOS 3.4  --   --   --   --   --   --   --   --    < > = values in this interval not displayed.    Liver Function Tests: Recent Labs  Lab 03/11/20 1435 03/12/20 0500 03/13/20 0137 03/15/20 0300  AST 27 35  --  56*  ALT 16 21  --  26  ALKPHOS 131* 110  --  212*  BILITOT 3.4* 2.9*  --  4.2*  PROT 6.8 6.2*  --  6.0*  ALBUMIN 3.1* 3.0* 2.9* 2.8*   No results for input(s): LIPASE, AMYLASE in the last 168 hours. Recent Labs  Lab 03/11/20 1031  AMMONIA 18    CBC: Recent Labs  Lab 03/11/20 1031 03/11/20 1031 03/11/20 1320 03/12/20 0500 03/13/20 0137 03/15/20 0300 03/16/20 0316  WBC 7.2  --   --  6.7 6.5 6.4 6.0  NEUTROABS 5.6  --   --   --   --   --   --   HGB 16.1*   < > 17.7* 13.9 13.3 13.1 12.9  HCT 51.2*   < > 52.0* 43.3 40.7 40.9 39.6  MCV 98.1  --   --  95.6 94.9 95.3 94.5  PLT 251  --   --  203 227 209 188   < > = values in this interval not displayed.    Cardiac Enzymes: Recent Labs  Lab 03/11/20 1435  CKTOTAL 243*    BNP: BNP (last 3 results) No results for input(s): BNP in the last 8760 hours.  ProBNP (last 3 results) No results for input(s): PROBNP in the last 8760 hours.    Other results:  Imaging: No results found.   Medications:     Scheduled Medications: . aspirin EC  81 mg Oral Daily  . atorvastatin  40 mg Oral QPM  . Chlorhexidine Gluconate Cloth  6 each Topical Daily  . clopidogrel  75 mg Oral Daily  . insulin aspart  0-15 Units Subcutaneous Q4H  . insulin glargine  5 Units Subcutaneous QHS  . sodium chloride flush  10-40 mL Intracatheter Q12H  . spironolactone  12.5 mg Oral Daily    Infusions: . milrinone 0.125 mcg/kg/min (03/17/20 0700)    PRN Medications: acetaminophen **OR** acetaminophen, ondansetron **OR** ondansetron (ZOFRAN) IV, polyethylene glycol, sodium chloride flush  Plan/Discussion:    1.  Acute on Chronic Systolic Heart Failure>> Cardiogenic Shock  - Echo 2016 EF 30-35%, RV moderately reduced. LHC showed mild nonobstructive CAD - Echo this admit shows EF 20-25%, RV severely reduced, severe LVH and thicken valves concerning for AL amyloid. Echo suggestive of cardiac amyloidosis but PYP is negative for TTR amyloid. 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  - milrinone decreased yesterday from 0.25 to 0.125 Co-ox back down to 50%. She now appears inotrope dependent. Will continue current does today and follow co-ox. Start low-dose losartan - volume status climbing back up. Will  Start oral lasix - Continue spiro - BP improved. Will try to titrate on low dose losartan - Will defer digoxin given problems with compliance in past. Add bidil as tolerated - no ? blocker w/ shock  - plan cMRI today if she can cooperate enough - SPEP negative. Kappa light chains mildly elevated. Suspect non-specific. If MRI c/w infiltrative pattern will need fat pad biopsy but I doubt this is AL amyloid - likely not candidate for advanced therapies given comorbidities. Not sure how she can be managed well at home.Will ask Palliative Care to see and get family involved  2. 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 IR consulted w/ plans to perform cerebral angiogram when stable - no intracardiac source of embolus noted on echo - no afib on tele  - management per neuro and IR - PT/OT recommending SNF  3. AKI - SCr on admit was 1.43, peaked at 1.8. Now down to 1.4 with milrinone - likely due to cardiogenic shock/ cardiorenal  - continue inotropic support  4. IDDD - management per primary team  - on SSI   5. CAD - mild nonobstructive CAD by cath in 2016, w/  10-20% LAD and 20% LCx  disease, RCA was small, nondominant w/ no significant coronary  plaque - medical management  - no s/s angina  Length of Stay: 6   Arvilla Meres MD 03/17/2020, 8:07 AM  Advanced Heart Failure Team Pager [phone] (M-F; 7a - 4p)  Please contact [facility] for night-coverage after hours (4p -7a ) and weekends on [email or web address]

## 2020-03-17 NOTE — Progress Notes (Signed)
   Subjective:  Patient evaluated at bedside this AM. She is doing well this morning and is in good spirits. She was able to eat her breakfast. She is able to recall that her mother came to visit her on Saturday.   Objective:  Vital signs in last 24 hours: Vitals:   03/17/20 0200 03/17/20 0300 03/17/20 0400 03/17/20 0500  BP: 128/88 117/76 115/83 119/77  Pulse: 99 (!) 103 100 (!) 103  Resp: (!) 25 (!) 23 20 (!) 24  Temp:   98.4 F (36.9 C)   TempSrc:   Oral   SpO2: 96% 91% 100% 100%  Weight:    49.6 kg  Height:       Physical Exam: General: Pleasant, no acute distress Extremities: Warm, dry.  Neuro: Awake but lethargic. Moving all four extremities.  Assessment/Plan: [name] is 59yo female with HFrEF, type 2 diabetes admitted 11/2 for acute metabolic encephalopathy 2/2 acute right thalamic infarct now requiring inotropic support due to cardiogenic shock undergoing evaluation for possible cardiac amyloidosis today.  Principal Problem:   Cardiogenic shock (HCC) Active Problems:   Chronic HFrEF (heart failure with reduced ejection fraction) (HCC)   Diabetes mellitus (HCC)   Hypertension   Pulmonary hypertension (HCC)   Chronic cholecystitis with calculus   Cerebral thrombosis with cerebral infarction   Acute urinary retention   AKI (acute kidney injury) (HCC)   Community acquired pneumonia  #Cardiogenic shock #Acute on chronic systolic heart failure TTE on admission w/ EF 20-25%, reduced RV function, severe MR, TR, and global longitudinal straining with apical straining. Concern for amyloidosis, PYP, SPEP, and urine studies inconclusive. cMRI today to further evaluate, cardiac amyloidosis v hypertensive cardiomyopathy. Co-ox 50% < 75% after decreasing milrinone yesterday. Appreciate cards following and recs. Cards recommending palliative given overall prognosis and co-morbidities. - C/w milrinone 0.125, spiro - Start losartan - cMRI today - Palliative consult  #Acute  right anterior thalamic infarct #Acute metabolic encephalopathy No change in mentation over last 24 hours. Continue to hold of cerebral angiogram, as would not change current treatment given cardiogenic shock. Will continue with DAPT, neurology following. - ASA, Plavix  #Acute kidney injury #Urinary retention Cr 1. 44 > 1.42 after diuresis holiday yesterday. Will hold off further diuresis per cards. Will continue trending kidney function. - C/w Foley for bladder compression - Trend RFP  #Type 2 diabetes mellitus A1c 7.3 this admission. CBG 130-160 after decreasing Lantus yesterday. Will continue to titrate as needed. Will need close follow-up given her inconsistent po intake 2/2 mental status. - C/w SSI, Lantus 5  DIET: Renal IVF: n/a DVT PPX: Lovenox BOWEL: Miralax CODE: FULL FAM COM: Spoke to patient's mother yesterday, will plan on calling again for further update today.  Prior to Admission Living Arrangement: Home Anticipated Discharge Location: SNF pending PT/OT eval Barriers to Discharge: medical management, mental status Dispo: Anticipated discharge in approximately 5-7 day(s).   Evlyn Kanner, MD 03/17/2020, 6:09 AM Pager: [phone] After 5pm on weekdays and 1pm on weekends: On Call pager [phone]

## 2020-03-17 NOTE — Progress Notes (Signed)
PT Cancellation Note  Patient Details Name: Christine Benitez MRN: [id number] DOB: 1961/04/23   Cancelled Treatment:     pt at MRI, will attempt later time permitting  Ginette Otto, DPT Acute Rehabilitation Services [phone]   Lucretia Field 03/17/2020, 10:33 AM

## 2020-03-17 NOTE — Progress Notes (Signed)
STROKE TEAM PROGRESS NOTE   INTERVAL HISTORY Patient is sitting up in bed.  She remains dysarthric with left hemiparesis.  Patient was scheduled for diagnostic cerebral arteriogram but due to patient's medical issues this is being postponed. She is still on milrinone drip and cardiology is closely monitoring her medications. Vitals:   03/17/20 0500 03/17/20 0600 03/17/20 0700 03/17/20 0800  BP: 119/77 115/80 115/77 (!) 128/97  Pulse: (!) 103   (!) 105  Resp: (!) 24 19 (!) 26 17  Temp:      TempSrc:      SpO2: 100%   99%  Weight: 49.6 kg     Height:       CBC:  Recent Labs  Lab 03/11/20 1031 03/11/20 1320 03/15/20 0300 03/16/20 0316  WBC 7.2   < > 6.4 6.0  NEUTROABS 5.6  --   --   --   HGB 16.1*   < > 13.1 12.9  HCT 51.2*   < > 40.9 39.6  MCV 98.1   < > 95.3 94.5  PLT 251   < > 209 188   < > = values in this interval not displayed.   Basic Metabolic Panel:  Recent Labs  Lab 03/13/20 0137 03/14/20 0554 03/15/20 2205 03/15/20 2205 03/16/20 0316 03/17/20 0345  NA 138   < > 138   < > 138 137  K 3.6   < > 3.3*   < > 3.7 4.0  CL 99   < > 99   < > 98 97*  CO2 21*   < > 29   < > 30 31  GLUCOSE 139*   < > 121*   < > 41* 166*  BUN 30*   < > 28*   < > 25* 21*  CREATININE 1.65*   < > 1.61*   < > 1.44* 1.42*  CALCIUM 8.3*   < > 7.9*   < > 8.1* 8.4*  MG  --   --  1.5*  --   --   --   PHOS 3.4  --   --   --   --   --    < > = values in this interval not displayed.   Lipid Panel:  Recent Labs  Lab 03/12/20 0500  CHOL 80  TRIG 73  HDL 24*  CHOLHDL 3.3  VLDL 15  LDLCALC 41   HgbA1c:  Recent Labs  Lab 03/12/20 0645  HGBA1C 7.3*   Urine Drug Screen:  Recent Labs  Lab 03/11/20 1031  LABOPIA NONE DETECTED  COCAINSCRNUR NONE DETECTED  LABBENZ NONE DETECTED  AMPHETMU NONE DETECTED  THCU NONE DETECTED  LABBARB NONE DETECTED    Alcohol Level  Recent Labs  Lab 03/11/20 1329  ETH <10    IMAGING past 24 hours No results found.  PHYSICAL EXAM    Temp:   [98.4 F (36.9 C)-98.6 F (37 C)] 98.4 F (36.9 C) (11/08 0400) Pulse Rate:  [85-105] 105 (11/08 0800) Resp:  [12-31] 17 (11/08 0800) BP: (82-128)/(43-97) 128/97 (11/08 0800) SpO2:  [91 %-100 %] 99 % (11/08 0800) Weight:  [49.6 kg] 49.6 kg (11/08 0500)  General -frail middle-aged African-American lady not in distress. Ophthalmologic - fundi not visualized due to noncooperation.  Cardiovascular - Regular rhythm and rate.  Respiratory -few scattered crackles bilaterally.  Neuro -awake and alert severe hard of hearing, oriented to age and self, but not to place and time. Able to speak with short sentences, but has  mild to moderate dysarthria.  Able to follow simple commands.  Blinking to visual threat bilaterally.  Extraocular movement impaired, left eye adduction incomplete, otherwise no significant extraocular movement on request.  Left pupil 2 mm, right pupil 3.5 mm, both reactive to light.  Left facial droop,Severe hard of hearing.  Tongue midline.  Left upper extremity 3-/5 with drift to bed within 10sec.  Left lower extremity 2/5.  Right upper and lower extremity at least 4/5.  Not cooperative on sensation and coordination test.  Gait not tested.   ASSESSMENT/PLAN [name] is a 59 y.o. female with history of of CHF, HTN, IDDM, iron deficiency anemia, NICM, CAD, pulmonary hypertension and smoking found down, presenting with lethargy and L sided weakness .   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 large vessel disease source.  Cardioembolic source cannot be completely excluded due to low EF.  CT head R thalamic hypodensity. Old L parietal cortial and subcortical infarct.   MRI  Anterior R thalamic into parasagittal R midbrain infarct   CTA head & neck Limited eval d/t bolus timing - severe distal BA stenosis, B PCA narrowing and possible proximal R PComA significant stenosis, possible R cavernous ICA stenosis. R>L pleural effusions.     Plan reassess for cerebral angiogram when medically more stable to evaluate basilar artery stenosis  2D Echo EF 20 to 25%.  LA moderately dilated.  LDL 41  HgbA1c 7.3  VTE prophylaxis - Lovenox 40 mg sq daily   aspirin 81 mg daily prior to admission, now on aspirin 325 mg daily and clopidogrel 75 mg daily.   Therapy recommendations:  SNF  Disposition:  pending   Basilar artery stenosis  CTA head and neck showed severe distal basilar artery stenosis  Plan reassess for cerebral angiogram later when medically more stable  On DAPT  Cardiomyopathy Acute on Chronic systolic CHF Cardiogenic Shock  TTE showed EF 20 to 25%  Home meds including Coreg, Lasix and enalapril  11/5 am in cardiogenic shock w/ desat and respiratory distress - lactic acid 4.9, hypoxic  Transferred to cardiac ICU - HF team following  On milrinone IV  Consider cardiac event monitoring as outpatient to rule out A. fib  Hypertension  Stable on the low side . Home meds including Coreg, Lasix and enalapril . Consider gradually resume home meds if BP allows . Long-term BP goal normotensive  Hyperlipidemia  Home meds:  lipitor 20  Now on lipitor 40  LDL 41, goal < 70  Continue statin at discharge  Diabetes type II Uncontrolled  HgbA1c 7.3, goal < 7.0  SSI  CBG monitoring  Hyperglycemia->hypoglycemia   Off Lantus  DB RN following  Close PCP follow-up for better DM control  AKI on CKD  Creatinine 1.42  Off IVF due to cardiogenic shock  Renal function monitoring   Other Stroke Risk Factors  Former Cigarette smoker, quit 4 yrs ago  Family hx stroke (mother)  CAD, medical mgmt  Other Active Problems  Pulmonary HTN  Chronic cholecystitis w/ calculus  Hospital day # 6 Continue aspirin and Plavix for now and ongoing management for congestive heart failure and cardiac issues as per cardiology.  Agree with postponing diagnostic cerebral catheter angiogram till patient  is medically more stable.  Stroke team will sign off for now kindly call us back after angiogram is completed for further recommendations.  Greater than 50% time during this 25-minute visit was spent on counseling  and coordination of care and discussion with care team. Delia Heady, MD To contact Stroke Continuity provider, please refer to [email or web address]. After hours, contact General Neurology

## 2020-03-18 DIAGNOSIS — I5023 Acute on chronic systolic (congestive) heart failure: Secondary | ICD-10-CM | POA: Diagnosis not present

## 2020-03-18 DIAGNOSIS — R570 Cardiogenic shock: Secondary | ICD-10-CM | POA: Diagnosis not present

## 2020-03-18 DIAGNOSIS — I6330 Cerebral infarction due to thrombosis of unspecified cerebral artery: Secondary | ICD-10-CM | POA: Diagnosis not present

## 2020-03-18 LAB — COOXEMETRY PANEL
Carboxyhemoglobin: 1.5 % (ref 0.5–1.5)
Methemoglobin: 0.7 % (ref 0.0–1.5)
O2 Saturation: 49.6 %
Total hemoglobin: 13.2 g/dL (ref 12.0–16.0)

## 2020-03-18 LAB — GLUCOSE, CAPILLARY
Glucose-Capillary: 254 mg/dL — ABNORMAL HIGH (ref 70–99)
Glucose-Capillary: 88 mg/dL (ref 70–99)
Glucose-Capillary: 179 mg/dL — ABNORMAL HIGH (ref 70–99)
Glucose-Capillary: 348 mg/dL — ABNORMAL HIGH (ref 70–99)
Glucose-Capillary: 314 mg/dL — ABNORMAL HIGH (ref 70–99)
Glucose-Capillary: 307 mg/dL — ABNORMAL HIGH (ref 70–99)

## 2020-03-18 LAB — BASIC METABOLIC PANEL
Anion gap: 8 (ref 5–15)
BUN: 17 mg/dL (ref 6–20)
CO2: 29 mmol/L (ref 22–32)
Calcium: 8.8 mg/dL — ABNORMAL LOW (ref 8.9–10.3)
Chloride: 100 mmol/L (ref 98–111)
Creatinine, Ser: 1.45 mg/dL — ABNORMAL HIGH (ref 0.44–1.00)
GFR, Estimated: 42 mL/min — ABNORMAL LOW (ref >60)
Glucose, Bld: 178 mg/dL — ABNORMAL HIGH (ref 70–99)
Potassium: 4.5 mmol/L (ref 3.5–5.1)
Sodium: 137 mmol/L (ref 135–145)

## 2020-03-18 MED ORDER — INSULIN ASPART 100 UNIT/ML ~~LOC~~ SOLN: 0.0000 [IU] | SUBCUTANEOUS | Status: DC

## 2020-03-18 MED ORDER — LOSARTAN POTASSIUM 25 MG PO TABS
25.0000 mg | ORAL_TABLET | Freq: Two times a day (BID) | ORAL | Status: DC
  Administered 2020-03-18: 25 mg via ORAL
  Filled 2020-03-18: qty 1

## 2020-03-18 MED ORDER — INSULIN GLARGINE 100 UNIT/ML ~~LOC~~ SOLN
8.0000 [IU] | Freq: Every day | SUBCUTANEOUS | Status: DC
  Administered 2020-03-18: 8 [IU] via SUBCUTANEOUS
  Filled 2020-03-18 (×2): qty 0.08

## 2020-03-18 MED ORDER — ENSURE ENLIVE PO LIQD
237.0000 mL | Freq: Two times a day (BID) | ORAL | Status: DC
  Administered 2020-03-18 – 2020-03-23 (×9): 237 mL via ORAL

## 2020-03-18 MED ORDER — INSULIN ASPART 100 UNIT/ML ~~LOC~~ SOLN
0.0000 [IU] | SUBCUTANEOUS | Status: DC
  Administered 2020-03-18: 3 [IU] via SUBCUTANEOUS

## 2020-03-18 MED ORDER — INSULIN ASPART 100 UNIT/ML ~~LOC~~ SOLN
0.0000 [IU] | Freq: Three times a day (TID) | SUBCUTANEOUS | Status: DC
  Administered 2020-03-18: 11 [IU] via SUBCUTANEOUS
  Administered 2020-03-19: 3 [IU] via SUBCUTANEOUS
  Administered 2020-03-19: 11 [IU] via SUBCUTANEOUS
  Administered 2020-03-20: 15 [IU] via SUBCUTANEOUS
  Administered 2020-03-20: 5 [IU] via SUBCUTANEOUS

## 2020-03-18 MED ORDER — DIGOXIN 125 MCG PO TABS
0.0625 mg | ORAL_TABLET | Freq: Every day | ORAL | Status: DC
  Administered 2020-03-18 – 2020-03-23 (×6): 0.0625 mg via ORAL
  Filled 2020-03-18 (×6): qty 1

## 2020-03-18 NOTE — Consult Note (Signed)
Consultation Note Date: 03/18/2020   Patient Name: Christine Benitez  DOB: [birth year]  MRN: [id number]  Age / Sex: 59 y.o., female  PCP: Leonides Sake, MD Referring Physician: Axel Filler, *  Reason for Consultation: Establishing goals of care  HPI/Patient Profile: 59 y.o. female  with past medical history of CHF (EF 30%), NICM, hypertension, pulmonary hypertensiontype diabetes admitted on 03/11/2020 with altered mental status related to  Right thalamic cerebral infarction. Hospitalization complicated by cardiogenic shock. Now appears to be inotrope dependent but not a considered to be a good candidate for home inotrope therapy.   Clinical Assessment and Goals of Care: I met today at Christine Benitez's bedside. She is sleeping and when awakened communication is very difficult due to hard of hearing. However, she is still very much confused as evident by her responses. She tells me repeatedly she has to return to work before ultimately falling back asleep.   I spent much time today speaking with her mother who shares that Christine Benitez has a daughter who is incarcerated and a son who is not involved (she has attempted to call him and provide updates). Otherwise it is mainly Christine Benitez's mother and 3 brothers that are her support. I spent time explaining to mother regarding complications with stroke and worsening CHF. We discussed milrinone and limitations and that Christine Benitez is not a candidate to continue this therapy. I explained concern that she could continue to decline due to worsening heart function with worsening shortness of breath, worsening renal function, and could reach end of life. I explained the importance of having a plan for how to care for [name] if she continues to decline. I recommended DNR and consideration of comfort care and hospice if she declines.   Mother seems to understand and is saddened to hear that [name] may be  nearing the end of her life but reflects that [name] has never taken care of herself and her diseases as she should. Mother was unable to make any firm decisions and requested that I call other family to explain so can discuss with them. I called and spoke with friend Christine Benitez, brother Roselie Awkward, sister-in-law Carlettta (Arnold's wife) and explained the same above to them all. I encouraged them to speak together regarding decisions and plan so that we can provide [name] with the appropriate care.   All questions/concerns addressed. Emotional support provided.   Primary Decision Maker NEXT OF KIN adult children not available so mother is decision maker support by Clydia's siblings    SUMMARY OF RECOMMENDATIONS   - Recommend DNR - Recommend comfort care and hospice if she declines  Code Status/Advance Care Planning:  Full code   Symptom Management:   Per attending and heart failure team  Palliative Prophylaxis:   Aspiration, Bowel Regimen and Delirium Protocol  Psycho-social/Spiritual:   Desire for further Chaplaincy support:no  Additional Recommendations: Education on Hospice and Grief/Bereavement Support  Prognosis:   Overall prognosis poor. Will see how she does over the next 24-48 hours off milrinone.   Discharge  Planning: To Be Determined      Primary Diagnoses: Present on Admission: . (Resolved) Acute cerebral ischemia . (Resolved) Chronic HFrEF (heart failure with reduced ejection fraction) ([place]) . Chronic cholecystitis with calculus . Cerebral thrombosis with cerebral infarction . Acute urinary retention . Cardiogenic shock ([place]) . AKI (acute kidney injury) ([place])   I have reviewed the medical record, interviewed the patient and family, and examined the patient. The following aspects are pertinent.  Past Medical History:  Diagnosis Date  . Abnormal LFTs    a. 05/2014 -  ALT 52, alk phos 130.  Marland Kitchen Chronic systolic CHF (congestive heart failure) ([place])    a. Dx 05/2014  - echo at [facility] - EF 30-35%, moderate LVH, no rWMA, mild to moderate MR, moderate PH with PA pressure 60-41mmHg, mild to moderate pericardial effusion. EF 30-35% by cath.  . History of blood transfusion 1986   "related to C-section"  . Hypertension   . Insulin dependent diabetes mellitus   . Iron deficiency anemia   . NICM (nonischemic cardiomyopathy) ([place])    a. [place] 06/11/14:  minor nonobstructive CAD with mild irregularities in the LAD less than 10-20%, LCx with mild disease less than 20%, no significant disease in the RCA, EF 30-35%.  . Pericardial effusion    a. Mild-moderate pericardial effusion by echo at [facility].  . Pulmonary hypertension ([place])    a. Moderate PH by echo at [facility].  . Tobacco abuse    Social History   Socioeconomic History  . Marital status: Divorced    Spouse name: Not on file  . Number of children: Not on file  . Years of education: Not on file  . Highest education level: Not on file  Occupational History  . Not on file  Tobacco Use  . Smoking status: Former Smoker    Packs/day: 0.50    Years: 22.00    Pack years: 11.00    Types: Cigarettes    Quit date: 03/26/2015    Years since quitting: 4.9  . Smokeless tobacco: Never Used  Substance and Sexual Activity  . Alcohol use: No  . Drug use: No  . Sexual activity: Yes  Other Topics Concern  . Not on file  Social History Narrative  . Not on file   Social Determinants of Health   Financial Resource Strain:   . Difficulty of Paying Living Expenses: Not on file  Food Insecurity:   . Worried About Charity fundraiser in the Last Year: Not on file  . Ran Out of Food in the Last Year: Not on file  Transportation Needs:   . Lack of Transportation (Medical): Not on file  . Lack of Transportation (Non-Medical): Not on file  Physical Activity:   . Days of Exercise per Week: Not on file  . Minutes of Exercise per Session: Not on file  Stress:   . Feeling of Stress : Not on file  Social Connections:    . Frequency of Communication with Friends and Family: Not on file  . Frequency of Social Gatherings with Friends and Family: Not on file  . Attends Religious Services: Not on file  . Active Member of Clubs or Organizations: Not on file  . Attends Archivist Meetings: Not on file  . Marital Status: Not on file   Family History  Problem Relation Age of Onset  . Coronary artery disease Mother   . Diabetes Mother   . Lupus Mother   . Stroke Mother   .  Lung cancer Father    Scheduled Meds: . aspirin EC  81 mg Oral Daily  . atorvastatin  40 mg Oral QPM  . Chlorhexidine Gluconate Cloth  6 each Topical Daily  . clopidogrel  75 mg Oral Daily  . digoxin  0.0625 mg Oral Daily  . enoxaparin (LOVENOX) injection  40 mg Subcutaneous Q24H  . feeding supplement  237 mL Oral BID BM  . furosemide  40 mg Oral Daily  . insulin aspart  0-15 Units Subcutaneous Q4H  . insulin glargine  8 Units Subcutaneous QHS  . losartan  25 mg Oral BID  . sodium chloride flush  10-40 mL Intracatheter Q12H  . spironolactone  12.5 mg Oral Daily   Continuous Infusions: . milrinone 0.125 mcg/kg/min (03/18/20 1151)   PRN Meds:.acetaminophen **OR** acetaminophen, ondansetron **OR** ondansetron (ZOFRAN) IV, polyethylene glycol, sodium chloride flush No Known Allergies Review of Systems  Unable to perform ROS: Acuity of condition    Physical Exam Vitals and nursing note reviewed.  Constitutional:      General: She is not in acute distress.    Appearance: She is ill-appearing.     Comments: Frail   Cardiovascular:     Rate and Rhythm: Normal rate.  Pulmonary:     Effort: Pulmonary effort is normal. No tachypnea, accessory muscle usage or respiratory distress.  Abdominal:     General: Abdomen is flat.     Palpations: Abdomen is soft.  Neurological:     Mental Status: She is easily aroused. She is confused.     Vital Signs: BP (!) 125/94 (BP Location: Right Arm)   Pulse 96   Temp 98.5 F (36.9  C) (Oral)   Resp 18   Ht $R'5\' 4"'ML$  (1.626 m)   Wt 50.2 kg   SpO2 96%   BMI 19.00 kg/m  Pain Scale: 0-10   Pain Score: 0-No pain   SpO2: SpO2: 96 % O2 Device:SpO2: 96 % O2 Flow Rate: .O2 Flow Rate (L/min): 1 L/min  IO: Intake/output summary:   Intake/Output Summary (Last 24 hours) at 03/18/2020 1207 Last data filed at 03/18/2020 0800 Gross per 24 hour  Intake 280.13 ml  Output 3275 ml  Net -2994.87 ml    LBM: Last BM Date: 03/16/20 Baseline Weight: Weight: 53.4 kg Most recent weight: Weight: 50.2 kg     Palliative Assessment/Data:     Time In/Out: 0600-4599, 1330-1350, 7741-4239 Time Total: 105 min Greater than 50%  of this time was spent counseling and coordinating care related to the above assessment and plan.  Signed by: Vinie Sill, NP Palliative Medicine Team Pager # [phone] (M-F 8a-5p) Team Phone # [phone] (Nights/Weekends)

## 2020-03-18 NOTE — Progress Notes (Signed)
° °  Subjective:  Patient evaluated at bedside this AM. Patient is resting comfortably in bed. She notes that she was sleeping. She was able to eat breakfast with eggs and sausage. She has not talked to her mother yet and is requesting [place] to call her. Patient is requesting her glasses at this time. Notes her abdomen is "okay"; no pain and notes that it is improved from prior. Discussed that this was likely related to her heart failure. She expresses understanding. Patient lives by herself and does not have much support at home.   Ordered: Ensure bid  Objective:  Vital signs in last 24 hours: Vitals:   03/17/20 1800 03/17/20 2334 03/18/20 0000 03/18/20 0307  BP:   129/87 137/90  Pulse: 97     Resp: 17  15 19   Temp:  98.3 F (36.8 C)  98 F (36.7 C)  TempSrc:  Oral  Oral  SpO2: 96%  95% 96%  Weight:    50.2 kg  Height:       Physical Exam: General: No acute distress, laying in bed Extremities: Warm, dry. Moving all four appropriately. Neuro: Awake but lethargic.  Assessment/Plan: Christine Benitez is 59yo female with chronic systolic heart failure, type 2 diabetes admitted 11/2 for acute metabolic encephalopathy 2/2 acute right thalamic infarct, now requiring inotropic support due to cardiogenic shock with minimal improvement.   Principal Problem:   Cardiogenic shock (HCC) Active Problems:   Diabetes mellitus (HCC)   Chronic cholecystitis with calculus   Cerebral thrombosis with cerebral infarction   Acute urinary retention   AKI (acute kidney injury) (HCC)  #Cardiogenic shock #Acute on chronic systolic heart failure Cardiac MRI yesterday, unlikely cardiac amyloidosis. Co-ox continues to be in 50's after decreasing milrinone over the weekend. Cardiology following, appreciate recommendations. Will continue diuresis and continue attempting to wean off milrinone with optimal medical management. Poor candidate for advanced therapies, palliative team consulted prior to full wean of  milrinone. - C/w milrinone 0.125 - C/w lasix, spironolactone - Increasing losartan - Starting digoxin today  #Acute kidney injury #Urinary retention Cr 1.42 > 1.45, will continue po lasix today. Will continue to trend. - C/w Foley for bladder compression.  #Acute right anterior thalamic infarct #Acute metabolic encephalopathy Mentation stable over last 24 hours. Given poor cardiac prognosis, will not pursue cerebral angiogram at this time. Plan to continue DAPT and monitor for any new neurological signs. - C/w ASA, Plavix  #Type 2 diabetes mellitus A1c 7.3 this admission. CBG 250 this AM. Will increase Lantus today. Can consider scheduled novolog for day time coverage if variability continues. Difficult to control given inconsistent po intake 2/2 mental status. - C/w SSI - Increase Lantus 8  DIET: Renal IVF: n/a DVT PPX: Lovenox BOWEL: Miralax CODE: FULL FAM COM: Plan to attempt call to patient's mother today to update.  Prior to Admission Living Arrangement: Home Anticipated Discharge Location: SNF pending PT/OT eval Barriers to Discharge: medical management Dispo: Anticipated discharge in approximately 5-7 day(s).   13/2, MD 03/18/2020, 6:31 AM Pager: [phone] After 5pm on weekdays and 1pm on weekends: On Call pager [phone]

## 2020-03-18 NOTE — Plan of Care (Signed)

## 2020-03-18 NOTE — Progress Notes (Signed)
Advanced Heart Failure Rounding Note   Subjective:    Central access placed 11/5. Co-ox 45%. Started on milrinone.   Denies CP or SOB. Keeps asking for the bedrail to be let down "just once"  Remains on milrinone at 0.125. co-ox low at 50% CVP 6  cMRI 11/7  1.LVEF 30%. EVEF 42%  2. Moderate-severe MR and severe TR 3. Non-coronary LGE pattern with extensive mid-myocardial scar in the basal to mid septal, inferior, and lateral walls. This could be consistent with prior myocarditis versus cardiac amyloidosis. Wall thickness is not particularly impressive for amyloidosis and ECV percentage is only 33% (mildly elevated, often see higher with amyloidosis).  Objective:   Weight Range:  Vital Signs:   Temp:  [98 F (36.7 C)-98.5 F (36.9 C)] 98 F (36.7 C) (11/09 0307) Pulse Rate:  [97-105] 97 (11/08 1800) Resp:  [15-26] 19 (11/09 0307) BP: (112-137)/(75-97) 137/90 (11/09 0307) SpO2:  [92 %-99 %] 96 % (11/09 0307) Weight:  [50.2 kg] 50.2 kg (11/09 0307) Last BM Date: 03/16/20 (Per report)  Weight change: Filed Weights   03/16/20 0500 03/17/20 0500 03/18/20 0307  Weight: 50.3 kg 49.6 kg 50.2 kg    Intake/Output:   Intake/Output Summary (Last 24 hours) at 03/18/2020 0551 Last data filed at 03/18/2020 0400 Gross per 24 hour  Intake 530.45 ml  Output 3425 ml  Net -2894.55 ml     Physical Exam: General:  Lying in bed No resp difficulty HEENT: normal x R ptosis Neck: supple. JVP 6-7  Carotids 2+ bilat; no bruits. No lymphadenopathy or thryomegaly appreciated. Cor: PMI nondisplaced.Tachy regular 2/6 MR Lungs: clear Abdomen: soft, nontender, nondistended. No hepatosplenomegaly. No bruits or masses. Good bowel sounds. Extremities: no cyanosis, clubbing, rash, edema Neuro: alert perseverating moves all 4     Telemetry: sinus 100-110 Personally reviewed   Labs: Basic Metabolic Panel: Recent Labs  Lab 03/13/20 0137 03/14/20 0554 03/15/20 0300 03/15/20 0300  03/15/20 2205 03/15/20 2205 03/16/20 0316 03/17/20 0345 03/18/20 0356  NA 138   < > 140  --  138  --  138 137 137  K 3.6   < > 4.3  --  3.3*  --  3.7 4.0 4.5  CL 99   < > 101  --  99  --  98 97* 100  CO2 21*   < > 26  --  29  --  30 31 29   GLUCOSE 139*   < > 185*  --  121*  --  41* 166* 178*  BUN 30*   < > 31*  --  28*  --  25* 21* 17  CREATININE 1.65*   < > 1.79*  --  1.61*  --  1.44* 1.42* 1.45*  CALCIUM 8.3*   < > 8.5*   < > 7.9*   < > 8.1* 8.4* 8.8*  MG  --   --   --   --  1.5*  --   --   --   --   PHOS 3.4  --   --   --   --   --   --   --   --    < > = values in this interval not displayed.    Liver Function Tests: Recent Labs  Lab 03/11/20 1435 03/12/20 0500 03/13/20 0137 03/15/20 0300  AST 27 35  --  56*  ALT 16 21  --  26  ALKPHOS 131* 110  --  212*  BILITOT 3.4*  2.9*  --  4.2*  PROT 6.8 6.2*  --  6.0*  ALBUMIN 3.1* 3.0* 2.9* 2.8*   No results for input(s): LIPASE, AMYLASE in the last 168 hours. Recent Labs  Lab 03/11/20 1031  AMMONIA 18    CBC: Recent Labs  Lab 03/11/20 1031 03/11/20 1031 03/11/20 1320 03/12/20 0500 03/13/20 0137 03/15/20 0300 03/16/20 0316  WBC 7.2  --   --  6.7 6.5 6.4 6.0  NEUTROABS 5.6  --   --   --   --   --   --   HGB 16.1*   < > 17.7* 13.9 13.3 13.1 12.9  HCT 51.2*   < > 52.0* 43.3 40.7 40.9 39.6  MCV 98.1  --   --  95.6 94.9 95.3 94.5  PLT 251  --   --  203 227 209 188   < > = values in this interval not displayed.    Cardiac Enzymes: Recent Labs  Lab 03/11/20 1435  CKTOTAL 243*    BNP: BNP (last 3 results) No results for input(s): BNP in the last 8760 hours.  ProBNP (last 3 results) No results for input(s): PROBNP in the last 8760 hours.    Other results:  Imaging: MR CARDIAC MORPHOLOGY W WO CONTRAST  Result Date: 03/17/2020 CLINICAL DATA:  Nonischemic cardiomyopathy EXAM: CARDIAC MRI TECHNIQUE: The patient was scanned on a 1.5 Tesla GE magnet. A dedicated cardiac coil was used. Functional imaging was  done using Fiesta sequences. 2,3, and 4 chamber views were done to assess for RWMA's. Modified Simpson's rule using a short axis stack was used to calculate an ejection fraction on a dedicated work [employer]. The patient received 9 cc of Gadavist. After 10 minutes inversion recovery sequences were used to assess for infiltration and scar tissue. FINDINGS: Moderate right pleural effusion.  Trivial pericardial effusion. Normal left ventricular size with mild LV hypertrophy (not severe as reported on echo). Diffuse LV hypokinesis with EF 30%. Mildly dilated right ventricle, no significant RV hypertrophy. Mildly decreased RV systolic function, EF 42%. Mild biatrial enlargement. Trileaflet aortic valve, no significant regurgitation or stenosis. Moderate-severe mitral regurgitation (flow sequences to confirm were not done). Severe tricuspid regurgitation. Delayed enhancement imaging: extensive basal to mid septal, inferior, and lateral mid-myocardial late gadolinium enhancement (LGE) with relative sparing of the apex and anterior wall. Measurements: LVEDV 148 mL LVSV 45 mL LVEF 30% RVEDV 189 mL RVSV 78 mL RVEF 42% Extracellular volume percentage: 33% IMPRESSION: 1.  Moderate right pleural effusion. 2. Normal LV size with mild LV hypertrophy, EF 30% with diffuse hypokinesis. 3. Mildly dilated RV without significant RVH, EF 42% (mildly decreased). 4. Moderate-severe MR and severe TR, flow sequences to quantify were not done. 5. Non-coronary LGE pattern with extensive mid-myocardial scar in the basal to mid septal, inferior, and lateral walls. This could be consistent with prior myocarditis versus cardiac amyloidosis. Wall thickness is not particularly impressive for amyloidosis and ECV percentage is only 33% (mildly elevated, often see higher with amyloidosis). Dalton Mclean Electronically Signed   By: Marca Ancona M.D.   On: 03/17/2020 13:35     Medications:     Scheduled Medications:   aspirin EC  81 mg Oral Daily   atorvastatin  40 mg Oral QPM   Chlorhexidine Gluconate Cloth  6 each Topical Daily   clopidogrel  75 mg Oral Daily   enoxaparin (LOVENOX) injection  40 mg Subcutaneous Q24H   furosemide  40 mg Oral Daily  insulin aspart  0-15 Units Subcutaneous Q4H   insulin glargine  5 Units Subcutaneous QHS   losartan  12.5 mg Oral BID   sodium chloride flush  10-40 mL Intracatheter Q12H   spironolactone  12.5 mg Oral Daily    Infusions:  milrinone 0.125 mcg/kg/min (03/18/20 0400)    PRN Medications: acetaminophen **OR** acetaminophen, ondansetron **OR** ondansetron (ZOFRAN) IV, polyethylene glycol, sodium chloride flush  Plan/Discussion:    1. Acute on Chronic Systolic Heart Failure>> Cardiogenic Shock  - Echo 2016 EF 30-35%, RV moderately reduced. LHC showed mild nonobstructive CAD - Echo this admit shows EF 20-25%, RV severely reduced, severe LVH and thicken valves concerning for AL amyloid. Echo suggestive of cardiac amyloidosis but PYP is negative for TTR amyloid. 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  - milrinone decreased 11/7 from 0.25 to 0.125 Co-ox remains 50%. She now appears inotrope dependent. Will continue current does today and follow co-ox. - volume status ok. Continue lasix 40 po daily - Continue spiro - BP improved. Will increase losartan to 25 bid. If tolarates switch to Entresto in am - Will start low-dose digoxin to try to get her off milrinone. Add bidil as tolerated - no ? blocker w/ shock  - cMRI not supportive of amyloid.  - SPEP negative. Kappa light chains mildly elevated. Suspect non-specific. - hard to know where to go from here. She appears inotrope dependent but is not a candidate for home inotropes or advanced therapies. Have asked Palliative Care to see. We will keep adjusting GDMT but little more to offer here. Await  Palliative input prior to full milrinone wean  2. 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 IR consulted w/ plans to perform cerebral angiogram when stable - no intracardiac source of embolus noted on echo - no afib on tele  - management per neuro and IR - PT/OT recommending SNF  3. AKI - SCr on admit was 1.43, peaked at 1.8. Now stable at 1.4 with milrinone - likely due to cardiogenic shock/ cardiorenal  - continue inotropic support for now. Will need to wean off soon  4. IDDD - management per primary team  - on SSI   5. CAD - mild nonobstructive CAD by cath in 2016, w/  10-20% LAD and 20% LCx disease, RCA was small, nondominant w/ no significant coronary plaque - medical management  - no s/s angina  Length of Stay: 7   Kensington Rios MD 03/18/2020, 5:51 AM  Advanced Heart Failure Team Pager [phone] (M-F; 7a - 4p)  Please contact [facility] for night-coverage after hours (4p -7a ) and weekends on [email or web address]

## 2020-03-18 NOTE — Progress Notes (Signed)
Physical Therapy Treatment Patient Details Name: Christine Benitez MRN: [id number] DOB: Apr 08, 1961 Today's Date: 03/18/2020    History of Present Illness Pt is a 59 y/o female admitted secondary to AMS and fall. 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 PMH includes CHF, HTN, DM, CAD, and pulm HTN.     PT Comments    Pt supine in bed on arrival this session.  She required increased time to rouse this session.  Participated in transfer and gt training this session.  Pt continues to benefit from skilled rehab in a post acute setting.    Follow Up Recommendations  SNF;Supervision/Assistance - 24 hour     Equipment Recommendations   (TBD)    Recommendations for Other Services       Precautions / Restrictions Precautions Precautions: Fall Precaution Comments: foley catheter; HOH Restrictions Weight Bearing Restrictions: No    Mobility  Bed Mobility Overal bed mobility: Needs Assistance Bed Mobility: Supine to Sit;Sit to Supine     Supine to sit: Min assist;HOB elevated     General bed mobility comments: Pt able initiate movement of LEs of edge of bed.  Utilized PTA as a railing to pull trunk into a seated position.  Transfers Overall transfer level: Needs assistance Equipment used: Rolling walker (2 wheeled) Transfers: Sit to/from Stand Sit to Stand: Min assist;Mod assist         General transfer comment: Inconsistent with assistance needed.  Pt required tactile cues for hip extension to stand and hip flexion to return to seated position.  Performed multiple reps for exercise to L side of body.  Ambulation/Gait Ambulation/Gait assistance: Mod assist Gait Distance (Feet): 12 Feet Assistive device: Rolling walker (2 wheeled) Gait Pattern/deviations: Step-to pattern;Shuffle;Trunk flexed;Decreased stride length;Decreased step length - left;Decreased stance time - left;Decreased dorsiflexion - left Gait velocity: decreased   General Gait Details: Pt with dragging of LLE to move  forward during swing phase.  Pt fatigues  quickly and noted to rest R forward on hand grip of RW.  L side continues to present with coordination and strength deficits.   Stairs             Wheelchair Mobility    Modified Rankin (Stroke Patients Only)       Balance Overall balance assessment: Needs assistance Sitting-balance support: Bilateral upper extremity supported;Feet supported Sitting balance-Leahy Scale: Poor Sitting balance - Comments: UE support on bed to maintain static sitting balance, with min guard for safety.     Standing balance-Leahy Scale: Poor                              Cognition Arousal/Alertness: Awake/alert Behavior During Therapy: Impulsive Overall Cognitive Status: No family/caregiver present to determine baseline cognitive functioning Area of Impairment: Attention;Following commands;Safety/judgement;Awareness;Problem solving                   Current Attention Level: Sustained   Following Commands: Follows one step commands inconsistently;Follows one step commands with increased time Safety/Judgement: Decreased awareness of safety;Decreased awareness of deficits Awareness: Intellectual Problem Solving: Slow processing;Decreased initiation;Difficulty sequencing;Requires verbal cues;Requires tactile cues General Comments: Required repeated cues for hand placement with transfers and step-by-step cues to sequence and problem-solve how to manage RW and steps during transfers.      Exercises Other Exercises Other Exercises: repeated sit to stand x 8 reps.    General Comments        Pertinent Vitals/Pain Pain Assessment: Faces Faces  Pain Scale: No hurt    Home Living                      Prior Function            PT Goals (current goals can now be found in the care plan section) Acute Rehab PT Goals Patient Stated Goal: to be able to walk and go home PT Goal Formulation: With patient Potential to Achieve  Goals: Fair Progress towards PT goals: Progressing toward goals    Frequency    Min 3X/week      PT Plan Current plan remains appropriate    Co-evaluation              AM-PAC PT "6 Clicks" Mobility   Outcome Measure  Help needed turning from your back to your side while in a flat bed without using bedrails?: A Little Help needed moving from lying on your back to sitting on the side of a flat bed without using bedrails?: A Little Help needed moving to and from a bed to a chair (including a wheelchair)?: A Little Help needed standing up from a chair using your arms (e.g., wheelchair or bedside chair)?: A Little Help needed to walk in hospital room?: A Little Help needed climbing 3-5 steps with a railing? : A Lot 6 Click Score: 17    End of Session Equipment Utilized During Treatment: Gait belt Activity Tolerance: Patient tolerated treatment well;Patient limited by fatigue Patient left: with call bell/phone within reach;with nursing/sitter in room;in bed;with bed alarm set Nurse Communication: Mobility status PT Visit Diagnosis: Unsteadiness on feet (R26.81);Muscle weakness (generalized) (M62.81);Other symptoms and signs involving the nervous system (R29.898);Other abnormalities of gait and mobility (R26.89);Difficulty in walking, not elsewhere classified (R26.2)     Time: 1633-1700 PT Time Calculation (min) (ACUTE ONLY): 27 min  Charges:  $Gait Training: 8-22 mins $Therapeutic Activity: 8-22 mins                     Bonney Leitz , PTA Acute Rehabilitation Services Pager [phone] Office [phone]     Narelle Schoening Artis Delay 03/18/2020, 5:15 PM

## 2020-03-19 DIAGNOSIS — R570 Cardiogenic shock: Secondary | ICD-10-CM | POA: Diagnosis not present

## 2020-03-19 DIAGNOSIS — Z515 Encounter for palliative care: Secondary | ICD-10-CM | POA: Diagnosis not present

## 2020-03-19 DIAGNOSIS — Z7189 Other specified counseling: Secondary | ICD-10-CM

## 2020-03-19 LAB — GLUCOSE, CAPILLARY
Glucose-Capillary: 331 mg/dL — ABNORMAL HIGH (ref 70–99)
Glucose-Capillary: 197 mg/dL — ABNORMAL HIGH (ref 70–99)
Glucose-Capillary: 108 mg/dL — ABNORMAL HIGH (ref 70–99)
Glucose-Capillary: 58 mg/dL — ABNORMAL LOW (ref 70–99)
Glucose-Capillary: 71 mg/dL (ref 70–99)

## 2020-03-19 LAB — CBC
HCT: 39.7 % (ref 36.0–46.0)
Hemoglobin: 12.8 g/dL (ref 12.0–15.0)
MCH: 30.8 pg (ref 26.0–34.0)
MCHC: 32.2 g/dL (ref 30.0–36.0)
MCV: 95.4 fL (ref 80.0–100.0)
Platelets: 229 10*3/uL (ref 150–400)
RBC: 4.16 MIL/uL (ref 3.87–5.11)
RDW: 18.3 % — ABNORMAL HIGH (ref 11.5–15.5)
WBC: 5.9 10*3/uL (ref 4.0–10.5)
nRBC: 0 % (ref 0.0–0.2)

## 2020-03-19 LAB — BASIC METABOLIC PANEL
Anion gap: 10 (ref 5–15)
BUN: 18 mg/dL (ref 6–20)
CO2: 24 mmol/L (ref 22–32)
Calcium: 8.9 mg/dL (ref 8.9–10.3)
Chloride: 99 mmol/L (ref 98–111)
Creatinine, Ser: 1.48 mg/dL — ABNORMAL HIGH (ref 0.44–1.00)
GFR, Estimated: 41 mL/min — ABNORMAL LOW (ref >60)
Glucose, Bld: 371 mg/dL — ABNORMAL HIGH (ref 70–99)
Potassium: 5 mmol/L (ref 3.5–5.1)
Sodium: 133 mmol/L — ABNORMAL LOW (ref 135–145)

## 2020-03-19 LAB — COOXEMETRY PANEL
Carboxyhemoglobin: 1.5 % (ref 0.5–1.5)
Methemoglobin: 0.7 % (ref 0.0–1.5)
O2 Saturation: 56.8 %
Total hemoglobin: 13.3 g/dL (ref 12.0–16.0)

## 2020-03-19 MED ORDER — INSULIN ASPART 100 UNIT/ML ~~LOC~~ SOLN
4.0000 [IU] | Freq: Three times a day (TID) | SUBCUTANEOUS | Status: DC
  Administered 2020-03-19 – 2020-03-21 (×6): 4 [IU] via SUBCUTANEOUS

## 2020-03-19 MED ORDER — INSULIN GLARGINE 100 UNIT/ML ~~LOC~~ SOLN
10.0000 [IU] | Freq: Every day | SUBCUTANEOUS | Status: DC
  Administered 2020-03-20: 10 [IU] via SUBCUTANEOUS
  Filled 2020-03-19 (×3): qty 0.1

## 2020-03-19 MED ORDER — INSULIN ASPART 100 UNIT/ML ~~LOC~~ SOLN: 3.0000 [IU] | Freq: Three times a day (TID) | SUBCUTANEOUS | Status: DC

## 2020-03-19 MED ORDER — SACUBITRIL-VALSARTAN 24-26 MG PO TABS
1.0000 | ORAL_TABLET | Freq: Two times a day (BID) | ORAL | Status: DC
  Administered 2020-03-19 (×2): 1 via ORAL
  Filled 2020-03-19 (×2): qty 1

## 2020-03-19 NOTE — Progress Notes (Signed)
SLP Cancellation Note  Patient Details Name: Christine Benitez MRN: [id number] DOB: 09/27/1960   Cancelled treatment:       Reason Eval/Treat Not Completed: Fatigue/lethargy limiting ability to participate. Pt was asleep upon SLP's entry and woke up with verbal and tactile stimulation, but was unable to maintain alertness for prolonged periods. SLP will follow up on subsequent date.   Cale Bethard I. Vear Clock, MS, CCC-SLP Acute Rehabilitation Services Office number [phone] Pager [phone]  Scheryl Marten 03/19/2020, 3:49 PM

## 2020-03-19 NOTE — Progress Notes (Signed)
This RN called Christine Benitez (SIL [phone]) today after speaking with Helmut Muster, NP Palliative to give family update as no one has called today. Also updated with current visitor policy of one visitor per day, can change each day from 7a-8p. Christine Benitez voiced frustration as she feels they are not being updated since time of admittance to ED. RN gave support to family member and gave update on patient condition. If patient were to deteriorate visiting restrictions would change. Gave Christine Benitez option to visit today as she has not had any, pt is sleeping. Christine Benitez stated she lives in [place] and would be by this evening.   1912: Christine Benitez to bedside.

## 2020-03-19 NOTE — Progress Notes (Signed)
   Subjective:  Christine Benitez was evaluated at bedside. She says that she has been waiting to see the doctors. She says "I'm depressed in here" and that "they are mean". She says that did not receive medication or food yesterday. She said that she did not receive her heart medicine or metformin. Discussed that we are giving insulin for her diabetes control. Endorses pain in neck for which we discussed that we can give tylenol for. She said she was able to talk to palliative care but does not recall the conversation.   Objective:  Vital signs in last 24 hours: Vitals:   03/18/20 1800 03/18/20 1943 03/18/20 2300 03/19/20 0426  BP: 129/84 116/72 125/86 126/85  Pulse:  (!) 107 (!) 104   Resp: 20 12 20 17   Temp:  98.7 F (37.1 C) 97.9 F (36.6 C) 97.9 F (36.6 C)  TempSrc:  Oral Oral Oral  SpO2:  96% 98% 97%  Weight:      Height:       Physical Exam: General: Sitting on side of bed, no acute distress Skin: Warm, dry. Bandage covering previous CVC site. Neuro: Awake, oriented to person, place, situation  Assessment/Plan: Christine. Benitez is 59yo female with chronic systolic heart failure, type 2 diabetes admitted 11/2 for acute metabolic encephalopathy 2/2 acute right thalamic infarct now with cardiogenic shock, thought to be inotrope dependent given worsening function with milrinone wean.  Principal Problem:   Cardiogenic shock (HCC) Active Problems:   Diabetes mellitus (HCC)   Chronic cholecystitis with calculus   Cerebral thrombosis with cerebral infarction   Acute urinary retention   AKI (acute kidney injury) (HCC)  #Cardiogenic shock #Acute on chronic systolic heart failure Last co-ox 57%. Patient pulled CVC overnight, now with milrinone in peripheral IV. Patient appears inotrope dependent but is not a candidate for home milrinone. Unfortunately, she is also a poor candidate for other advanced therapies. Palliative talked to patient yesterday, will continue to discuss with family  next steps. - D/c milrinone - Switch losartan to entresto - C/w lasix, spironolactone, digoxin  #Acute kidney injury #Urinary retention Cr 1.45 > 1.48. Continue with lasix, possibly at new baseline. - C/w Foley for bladder compression given previous stretch injury  #Acute right anterior thalamic infarct #Acute metabolic encephalopathy No change in mentation. Will not pursue angiogram given poor cardiac prognosis as the inotropic support is stopped today. Continue DAPT and monitor. - C/w ASA, Plavix  #Type 2 diabetes mellitus A1c 7.3 on admission. CBG rising to 370 this morning despite increasing Lantus to 8 yesterday. Will increase to 10 and start scheduled Novolog TID WC.  - C/w SSI - Increase Lantus 10 - Start Novolog 4u TID WC  DIET: Renal IVF: n/a DVT PPX: Lovenox BOWEL: Miralax CODE: FULL FAM COM: Was not able to reach mother yesterday. Will discuss with 13/2, NP with palliative to discuss next steps.  Prior to Admission Living Arrangement: Home Anticipated Discharge Location: SNF pending PT/OT Barriers to Discharge: medical management Dispo: Anticipated discharge in approximately 5-6 day(s).   Yong Channel, MD 03/19/2020, 6:36 AM Pager: [phone] After 5pm on weekdays and 1pm on weekends: On Call pager [phone]

## 2020-03-19 NOTE — Progress Notes (Signed)
RN went in room and found pt Christine Benitez pulled over halfway out her neck. RN and Consulting civil engineer pulled Christine Benitez, applied vaseline gauze and pressure dressing to site. Milrinone was running through Christine Benitez and is now running through left peripheral IV which is pt's only access currently. MD notified. Will continue to monitor.

## 2020-03-19 NOTE — Progress Notes (Addendum)
Advanced Heart Failure Rounding Note   Subjective:    Central access placed 11/5. Co-ox 45%. Started on milrinone.   Yesterday: Milrinone at 0.125. co-ox low at 50%. CVP 6.   Today: Milrinone running peripherally at 0.125 d/t IJ being pulled out this morning.  Co-ox 57%. Denies chest pain or shortness of breath.    cMRI 11/7  1.LVEF 30%. EVEF 42%  2. Moderate-severe MR and severe TR 3. Non-coronary LGE pattern with extensive mid-myocardial scar in the basal to mid septal, inferior, and lateral walls. This could be consistent with prior myocarditis versus cardiac amyloidosis. Wall thickness is not particularly impressive for amyloidosis and ECV percentage is only 33% (mildly elevated, often see higher with amyloidosis).  Objective:   Weight Range:  Vital Signs:   Temp:  [97.9 F (36.6 C)-98.9 F (37.2 C)] 97.9 F (36.6 C) (11/10 0426) Pulse Rate:  [96-107] 104 (11/09 2300) Resp:  [12-20] 17 (11/10 0426) BP: (114-134)/(72-94) 126/85 (11/10 0426) SpO2:  [96 %-98 %] 97 % (11/10 0426) Weight:  [49.2 kg] 49.2 kg (11/10 0500) Last BM Date: 03/16/20  Weight change: Filed Weights   03/17/20 0500 03/18/20 0307 03/19/20 0500  Weight: 49.6 kg 50.2 kg 49.2 kg    Intake/Output:   Intake/Output Summary (Last 24 hours) at 03/19/2020 0741 Last data filed at 03/19/2020 0500 Gross per 24 hour  Intake 510.41 ml  Output 2725 ml  Net -2214.59 ml     Physical Exam:  General:  Well appearing. No resp difficulty HEENT: normal Neck: supple. no JVD. Carotids 2+ bilat; no bruits. Dressing to right neck.  No lymphadenopathy or thryomegaly appreciated. Cor: PMI nondisplaced. Regular rhythm. No rubs, gallops or murmurs. Lungs: clear bilaterally.  Abdomen: soft, nontender, nondistended. No bruits or masses. Good bowel sounds. Extremities: no cyanosis, clubbing, rash, edema Neuro: alert & oriented x person, place and events, moves all 4 extremities w/o difficulty. Affect pleasant.   Facial asymmetry.      Telemetry: ST rates in low 100s-110s.  Personally reviewed.    Labs: Basic Metabolic Panel: Recent Labs  Lab 03/13/20 0137 03/14/20 0554 03/15/20 2205 03/15/20 2205 03/16/20 0316 03/16/20 0316 03/17/20 0345 03/18/20 0356 03/19/20 0425  NA 138   < > 138  --  138  --  137 137 133*  K 3.6   < > 3.3*  --  3.7  --  4.0 4.5 5.0  CL 99   < > 99  --  98  --  97* 100 99  CO2 21*   < > 29  --  30  --  31 29 24   GLUCOSE 139*   < > 121*  --  41*  --  166* 178* 371*  BUN 30*   < > 28*  --  25*  --  21* 17 18  CREATININE 1.65*   < > 1.61*  --  1.44*  --  1.42* 1.45* 1.48*  CALCIUM 8.3*   < > 7.9*   < > 8.1*   < > 8.4* 8.8* 8.9  MG  --   --  1.5*  --   --   --   --   --   --   PHOS 3.4  --   --   --   --   --   --   --   --    < > = values in this interval not displayed.    Liver Function Tests: Recent Labs  Lab 03/13/20 [phone]  03/15/20 0300  AST  --  56*  ALT  --  26  ALKPHOS  --  212*  BILITOT  --  4.2*  PROT  --  6.0*  ALBUMIN 2.9* 2.8*   No results for input(s): LIPASE, AMYLASE in the last 168 hours. No results for input(s): AMMONIA in the last 168 hours.  CBC: Recent Labs  Lab 03/13/20 0137 03/15/20 0300 03/16/20 0316 03/19/20 0425  WBC 6.5 6.4 6.0 5.9  HGB 13.3 13.1 12.9 12.8  HCT 40.7 40.9 39.6 39.7  MCV 94.9 95.3 94.5 95.4  PLT 227 209 188 229    Cardiac Enzymes: No results for input(s): CKTOTAL, CKMB, CKMBINDEX, TROPONINI in the last 168 hours.  BNP: BNP (last 3 results) No results for input(s): BNP in the last 8760 hours.  ProBNP (last 3 results) No results for input(s): PROBNP in the last 8760 hours.    Other results:  Imaging: Marland Kitchen MR CARDIAC MORPHOLOGY W WO CONTRAST .  Marland Kitchen Result Date: 03/17/2020 . CLINICAL DATA:  Nonischemic cardiomyopathy EXAM: CARDIAC MRI TECHNIQUE: The patient was scanned on a 1.5 Tesla GE magnet. A dedicated cardiac coil was used. Functional imaging was done using Fiesta sequences. 2,3, and 4 chamber  views were done to assess for RWMA's. Modified Simpson's rule using a short axis stack was used to calculate an ejection fraction on a dedicated work [employer]. The patient received 9 cc of Gadavist. After 10 minutes inversion recovery sequences were used to assess for infiltration and scar tissue. FINDINGS: Moderate right pleural effusion.  Trivial pericardial effusion. Normal left ventricular size with mild LV hypertrophy (not severe as reported on echo). Diffuse LV hypokinesis with EF 30%. Mildly dilated right ventricle, no significant RV hypertrophy. Mildly decreased RV systolic function, EF 42%. Mild biatrial enlargement. Trileaflet aortic valve, no significant regurgitation or stenosis. Moderate-severe mitral regurgitation (flow sequences to confirm were not done). Severe tricuspid regurgitation. Delayed enhancement imaging: extensive basal to mid septal, inferior, and lateral mid-myocardial late gadolinium enhancement (LGE) with relative sparing of the apex and anterior wall. Measurements: LVEDV 148 mL LVSV 45 mL LVEF 30% RVEDV 189 mL RVSV 78 mL RVEF 42% Extracellular volume percentage: 33% IMPRESSION: 1.  Moderate right pleural effusion. 2. Normal LV size with mild LV hypertrophy, EF 30% with diffuse hypokinesis. 3. Mildly dilated RV without significant RVH, EF 42% (mildly decreased). 4. Moderate-severe MR and severe TR, flow sequences to quantify were not done. 5. Non-coronary LGE pattern with extensive mid-myocardial scar in the basal to mid septal, inferior, and lateral walls. This could be consistent with prior myocarditis versus cardiac amyloidosis. Wall thickness is not particularly impressive for amyloidosis and ECV percentage is only 33% (mildly elevated, often see higher with amyloidosis). Dalton Mclean Electronically Signed   By: Marca Ancona M.D.   On: 03/17/2020 13:35  .    Medications:     Scheduled Medications: . . . aspirin EC .  81 mg . Oral . Daily  . .  . atorvastatin .  40 mg . Oral . QPM  . . . Chlorhexidine Gluconate Cloth .  6 each . Topical . Daily  . . . clopidogrel .  75 mg . Oral . Daily  . . . digoxin .  0.0625 mg . Oral . Daily  . . . enoxaparin (LOVENOX) injection .  40 mg . Subcutaneous . Q24H  . Marland Kitchen . feeding supplement .  237 mL . Oral . BID BM  . . . furosemide .  40 mg . Oral . Daily  . . . insulin aspart .  0-15 Units . Subcutaneous . TID WC  . Marland Kitchen . insulin aspart .  4 Units . Subcutaneous . TID WC  . Marland Kitchen . insulin glargine .  10 Units . Subcutaneous . QHS  . Marland Kitchen . losartan .  25 mg . Oral . BID  . Marland Kitchen . sodium chloride flush .  10-40 mL . Intracatheter . Q12H  . Marland Kitchen Marland Kitchen spironolactone .  12.5 mg . Oral . Daily  .   Infusions: . . . milrinone . 0.125 mcg/kg/min (03/18/20 1151)  .   PRN Medications: . acetaminophen **OR** acetaminophen, ondansetron **OR** ondansetron (ZOFRAN) IV, polyethylene glycol, sodium chloride flush  Plan/Discussion:    1. Acute on Chronic Systolic Heart Failure>> Cardiogenic Shock  - Echo 2016 EF 30-35%, RV moderately reduced. LHC showed mild nonobstructive CAD - Echo this admit shows EF 20-25%, RV severely reduced, severe LVH and thicken valves concerning for AL amyloid. Echo suggestive of cardiac amyloidosis but PYP is negative for TTR amyloid. 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  - cMRI not supportive of amyloid. - SPEP negative. Kappa light chains mildly elevated. Suspect non-specific. - milrinone decreased 11/7 from 0.25 to 0.125 Co-ox remained 50%. She appears inotrope dependent but is not a candidate for home inotropes or advanced therapies.  - volume status stable w/ co-ox 57%, -11L & -9 lbs since admission.  - Continue lasix 40 po daily - Continue spiro - BP stable switch from losartan to entresto - C/w low-dose digoxin to try to get her off milrinone. Add bidil as tolerated - no ?  blocker w/ shock  - Await Palliative input prior to full milrinone wean   2. 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 IR consulted w/ plans to perform cerebral angiogram when stable - no intracardiac source of embolus noted on echo - no afib on tele  - management per neuro and IR - PT/OT recommending SNF   3. AKI - SCr on admit was 1.43, peaked at 1.8. Now stable at 1.4 for last several days with milrinone - likely due to cardiogenic shock/ cardiorenal  - continue inotropic support for now. Will need to wean off soon  4. IDDD - management per primary team  - on SSI    5. CAD - mild nonobstructive CAD by cath in 2016, w/  10-20% LAD and 20% LCx disease, RCA was small, nondominant w/ no significant coronary plaque - medical management  - no s/s angina  Length of Stay: 8   Terance Ice NP 03/19/2020, 7:41 AM  Advanced Heart Failure Team Pager [phone] (M-F; 7a - 4p)  Please contact [facility] for night-coverage after hours (4p -7a ) and weekends on [email or web address]   Patient seen and examined with the above-signed Advanced Practice Provider and/or Housestaff. I personally reviewed laboratory data, imaging studies and relevant notes. I independently examined the patient and formulated the important aspects of the plan. I have edited the note to reflect any of my changes or salient points. I have personally discussed the plan with the patient and/or family.  Co-ox improved at 57% this am on milrinone 0.125. However she pulled IJ line out. CVP was 6. Poor historian but says she doesn't want to be here. Meeting with  Palliative  General:  Weak appearing. Dysarthric No resp difficulty HEENT: normal Neck: supple. no JVD. Carotids 2+ bilat; no bruits. No lymphadenopathy or thryomegaly appreciated. Cor: PMI nondisplaced. Regular rate & rhythm. No rubs, gallops or murmurs. Lungs: clear Abdomen: soft, nontender, nondistended. No  hepatosplenomegaly. No bruits or masses. Good bowel sounds. Extremities: no cyanosis, clubbing, rash, edema Neuro: alert, cranial nerves grossly intact. L sided weaknessnt   Agree with above. Co-ox improved on low-dose milrinone. Will stop and given overall trajectory will not plan to restart or replace central access for monitoring. Will follow clinically. Switch losartan to entresto 24/26 bid. Watch potassium closely. She is not candidate for advanced HF therapies. Await palliative recs.   Arvilla Meres, MD  8:12 AM

## 2020-03-20 DIAGNOSIS — Z7189 Other specified counseling: Secondary | ICD-10-CM

## 2020-03-20 DIAGNOSIS — R4182 Altered mental status, unspecified: Secondary | ICD-10-CM

## 2020-03-20 DIAGNOSIS — R570 Cardiogenic shock: Secondary | ICD-10-CM | POA: Diagnosis not present

## 2020-03-20 DIAGNOSIS — Z789 Other specified health status: Secondary | ICD-10-CM

## 2020-03-20 DIAGNOSIS — Z66 Do not resuscitate: Secondary | ICD-10-CM

## 2020-03-20 LAB — BASIC METABOLIC PANEL
Anion gap: 16 — ABNORMAL HIGH (ref 5–15)
Anion gap: 11 (ref 5–15)
Anion gap: 10 (ref 5–15)
BUN: 19 mg/dL (ref 6–20)
BUN: 19 mg/dL (ref 6–20)
BUN: 19 mg/dL (ref 6–20)
CO2: 20 mmol/L — ABNORMAL LOW (ref 22–32)
CO2: 24 mmol/L (ref 22–32)
CO2: 24 mmol/L (ref 22–32)
Calcium: 9.6 mg/dL (ref 8.9–10.3)
Calcium: 9.3 mg/dL (ref 8.9–10.3)
Calcium: 9 mg/dL (ref 8.9–10.3)
Chloride: 97 mmol/L — ABNORMAL LOW (ref 98–111)
Chloride: 95 mmol/L — ABNORMAL LOW (ref 98–111)
Chloride: 100 mmol/L (ref 98–111)
Creatinine, Ser: 1.71 mg/dL — ABNORMAL HIGH (ref 0.44–1.00)
Creatinine, Ser: 1.77 mg/dL — ABNORMAL HIGH (ref 0.44–1.00)
Creatinine, Ser: 1.82 mg/dL — ABNORMAL HIGH (ref 0.44–1.00)
GFR, Estimated: 34 mL/min — ABNORMAL LOW (ref >60)
GFR, Estimated: 33 mL/min — ABNORMAL LOW (ref >60)
GFR, Estimated: 32 mL/min — ABNORMAL LOW (ref >60)
Glucose, Bld: 254 mg/dL — ABNORMAL HIGH (ref 70–99)
Glucose, Bld: 404 mg/dL — ABNORMAL HIGH (ref 70–99)
Glucose, Bld: 171 mg/dL — ABNORMAL HIGH (ref 70–99)
Potassium: 5.6 mmol/L — ABNORMAL HIGH (ref 3.5–5.1)
Potassium: 5.2 mmol/L — ABNORMAL HIGH (ref 3.5–5.1)
Potassium: 4.2 mmol/L (ref 3.5–5.1)
Sodium: 133 mmol/L — ABNORMAL LOW (ref 135–145)
Sodium: 130 mmol/L — ABNORMAL LOW (ref 135–145)
Sodium: 134 mmol/L — ABNORMAL LOW (ref 135–145)

## 2020-03-20 LAB — CBC
HCT: 48.4 % — ABNORMAL HIGH (ref 36.0–46.0)
HCT: 54.3 % — ABNORMAL HIGH (ref 36.0–46.0)
Hemoglobin: 15.4 g/dL — ABNORMAL HIGH (ref 12.0–15.0)
Hemoglobin: 17.4 g/dL — ABNORMAL HIGH (ref 12.0–15.0)
MCH: 30.5 pg (ref 26.0–34.0)
MCH: 30.4 pg (ref 26.0–34.0)
MCHC: 31.8 g/dL (ref 30.0–36.0)
MCHC: 32 g/dL (ref 30.0–36.0)
MCV: 95.8 fL (ref 80.0–100.0)
MCV: 94.9 fL (ref 80.0–100.0)
Platelets: 259 10*3/uL (ref 150–400)
Platelets: 261 10*3/uL (ref 150–400)
RBC: 5.05 MIL/uL (ref 3.87–5.11)
RBC: 5.72 MIL/uL — ABNORMAL HIGH (ref 3.87–5.11)
RDW: 18.1 % — ABNORMAL HIGH (ref 11.5–15.5)
RDW: 19.1 % — ABNORMAL HIGH (ref 11.5–15.5)
WBC: 6.2 10*3/uL (ref 4.0–10.5)
WBC: 6.7 10*3/uL (ref 4.0–10.5)
nRBC: 0 % (ref 0.0–0.2)
nRBC: 0 % (ref 0.0–0.2)

## 2020-03-20 LAB — GLUCOSE, CAPILLARY
Glucose-Capillary: 140 mg/dL — ABNORMAL HIGH (ref 70–99)
Glucose-Capillary: 222 mg/dL — ABNORMAL HIGH (ref 70–99)
Glucose-Capillary: 257 mg/dL — ABNORMAL HIGH (ref 70–99)
Glucose-Capillary: 280 mg/dL — ABNORMAL HIGH (ref 70–99)
Glucose-Capillary: 29 mg/dL — CL (ref 70–99)
Glucose-Capillary: 374 mg/dL — ABNORMAL HIGH (ref 70–99)
Glucose-Capillary: 386 mg/dL — ABNORMAL HIGH (ref 70–99)

## 2020-03-20 LAB — DIGOXIN LEVEL: Digoxin Level: 0.8 ng/mL — ABNORMAL LOW (ref 1.0–2.0)

## 2020-03-20 MED ORDER — LIDOCAINE 5 % EX PTCH
1.0000 | MEDICATED_PATCH | CUTANEOUS | Status: DC
  Administered 2020-03-20 – 2020-03-25 (×6): 1 via TRANSDERMAL
  Filled 2020-03-20 (×7): qty 1

## 2020-03-20 MED ORDER — SODIUM CHLORIDE 0.9 % IV BOLUS
500.0000 mL | Freq: Once | INTRAVENOUS | Status: AC
  Administered 2020-03-20: 500 mL via INTRAVENOUS

## 2020-03-20 MED ORDER — ENOXAPARIN SODIUM 30 MG/0.3ML ~~LOC~~ SOLN
30.0000 mg | SUBCUTANEOUS | Status: DC
  Administered 2020-03-20 – 2020-03-25 (×5): 30 mg via SUBCUTANEOUS
  Filled 2020-03-20 (×6): qty 0.3

## 2020-03-20 MED ORDER — DEXTROSE 50 % IV SOLN
INTRAVENOUS | Status: AC
  Administered 2020-03-20: 50 mL
  Filled 2020-03-20: qty 50

## 2020-03-20 MED ORDER — INSULIN GLARGINE 100 UNIT/ML ~~LOC~~ SOLN
15.0000 [IU] | Freq: Every day | SUBCUTANEOUS | Status: DC
  Administered 2020-03-20 – 2020-03-23 (×3): 15 [IU] via SUBCUTANEOUS
  Filled 2020-03-20 (×4): qty 0.15

## 2020-03-20 NOTE — Progress Notes (Signed)
Hypoglycemic Event  CBG: 29   Treatment: D50 25 mL (12.5 gm)  Symptoms: Pale  Follow-up CBG: Time:1716 CBG Result 140   Possible Reasons for Event: medication  Comments/MD notified:Dr. Evon Slack, Pry[place] Montes I

## 2020-03-20 NOTE — Progress Notes (Signed)
Advanced Heart Failure Rounding Note   Subjective:    Central access placed 11/5. Co-ox 45%. Started on milrinone.   Yesterday: Stopped milrinone, Co-ox 57% (pulled out IJ).  Stopped losartan and started entresto. Palliative had family discussion regarding goals of care, will monitor off milrinone 24-48 hours.   Today: Labs appear hemoconcentrated with elevated Hgb, K+, Cr.  Hold off on diuresis.  Repeat labs.      cMRI 11/7  1.LVEF 30%. EVEF 42%  2. Moderate-severe MR and severe TR 3. Non-coronary LGE pattern with extensive mid-myocardial scar in the basal to mid septal, inferior, and lateral walls. This could be consistent with prior myocarditis versus cardiac amyloidosis. Wall thickness is not particularly impressive for amyloidosis and ECV percentage is only 33% (mildly elevated, often see higher with amyloidosis).  Objective:   Weight Range:  Vital Signs:   Temp:  [97.5 F (36.4 C)-98.1 F (36.7 C)] 97.7 F (36.5 C) (11/11 0722) Pulse Rate:  [97-107] 97 (11/11 0722) Resp:  [13-31] 20 (11/11 0722) BP: (96-124)/(65-91) 121/88 (11/11 0722) SpO2:  [94 %-97 %] 94 % (11/11 0722) Weight:  [48.1 kg] 48.1 kg (11/11 0349) Last BM Date: 03/19/20  Weight change: Filed Weights   03/18/20 0307 03/19/20 0500 03/20/20 0349  Weight: 50.2 kg 49.2 kg 48.1 kg    Intake/Output:   Intake/Output Summary (Last 24 hours) at 03/20/2020 0738 Last data filed at 03/20/2020 0357 Gross per 24 hour  Intake 31.57 ml  Output 3150 ml  Net -3118.43 ml     Physical Exam:  General:  Well appearing. No resp difficulty HEENT: normal Neck: supple. no JVD. Carotids 2+ bilat; no bruits. No lymphadenopathy or thryomegaly appreciated. Cor: PMI nondisplaced. Regular rate & rhythm. No rubs, gallops or murmurs. Lungs: clear Abdomen: soft, nontender, nondistended. No hepatosplenomegaly. No bruits or masses. Good bowel sounds. Extremities: no cyanosis, clubbing, rash, edema Neuro: alert &  orientedx3, cranial nerves grossly intact. moves all 4 extremities w/o difficulty. Affect pleasant      Telemetry: SR-ST rates 90s-100s, personally reviewed.     Labs: Basic Metabolic Panel: Recent Labs  Lab 03/15/20 2205 03/16/20 0316 03/17/20 0345 03/17/20 0345 03/18/20 0356 03/18/20 0356 03/19/20 0425 03/20/20 0019 03/20/20 0655  NA 138   < > 137  --  137  --  133* 133* 130*  K 3.3*   < > 4.0  --  4.5  --  5.0 5.6* 5.2*  CL 99   < > 97*  --  100  --  99 97* 95*  CO2 29   < > 31  --  29  --  24 20* 24  GLUCOSE 121*   < > 166*  --  178*  --  371* 254* 404*  BUN 28*   < > 21*  --  17  --  18 19 19   CREATININE 1.61*   < > 1.42*  --  1.45*  --  1.48* 1.71* 1.77*  CALCIUM 7.9*   < > 8.4*   < > 8.8*   < > 8.9 9.6 9.3  MG 1.5*  --   --   --   --   --   --   --   --    < > = values in this interval not displayed.    Liver Function Tests: Recent Labs  Lab 03/15/20 0300  AST 56*  ALT 26  ALKPHOS 212*  BILITOT 4.2*  PROT 6.0*  ALBUMIN 2.8*   No results  for input(s): LIPASE, AMYLASE in the last 168 hours. No results for input(s): AMMONIA in the last 168 hours.  CBC: Recent Labs  Lab 03/15/20 0300 03/16/20 0316 03/19/20 0425 03/20/20 0019  WBC 6.4 6.0 5.9 6.7  HGB 13.1 12.9 12.8 17.4*  HCT 40.9 39.6 39.7 54.3*  MCV 95.3 94.5 95.4 94.9  PLT 209 188 229 261    Cardiac Enzymes: No results for input(s): CKTOTAL, CKMB, CKMBINDEX, TROPONINI in the last 168 hours.  BNP: BNP (last 3 results) No results for input(s): BNP in the last 8760 hours.  ProBNP (last 3 results) No results for input(s): PROBNP in the last 8760 hours.    Other results:  Imaging: No results found.   Medications:     Scheduled Medications: . aspirin EC  81 mg Oral Daily  . atorvastatin  40 mg Oral QPM  . Chlorhexidine Gluconate Cloth  6 each Topical Daily  . clopidogrel  75 mg Oral Daily  . digoxin  0.0625 mg Oral Daily  . enoxaparin (LOVENOX) injection  40 mg Subcutaneous Q24H   . feeding supplement  237 mL Oral BID BM  . insulin aspart  0-15 Units Subcutaneous TID WC  . insulin aspart  4 Units Subcutaneous TID WC  . insulin glargine  15 Units Subcutaneous QHS  . sodium chloride flush  10-40 mL Intracatheter Q12H    Infusions:   PRN Medications: acetaminophen **OR** acetaminophen, ondansetron **OR** ondansetron (ZOFRAN) IV, polyethylene glycol, sodium chloride flush  Plan/Discussion:    1. Acute on Chronic Systolic Heart Failure>> Cardiogenic Shock  - Echo 2016 EF 30-35%, RV moderately reduced. LHC showed mild nonobstructive CAD - Echo this admit shows EF 20-25%, RV severely reduced, severe LVH and thicken valves concerning for AL amyloid. Echo suggestive of cardiac amyloidosis but PYP is negative for TTR amyloid. 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  - cMRI not supportive of amyloid. - SPEP negative. Kappa light chains mildly elevated. Suspect non-specific. - milrinone decreased 11/7 from 0.25 to 0.125 Co-ox remained 50%. She appeared inotrope dependent but is not a candidate for home inotropes or advanced therapies.   - Milrinone stopped on 11/10.   - volume status stable,  -14L & -11 lbs since admission.  - Hold lasix 40 po daily, spiro and entresto due to labs today - C/w low-dose digoxin.  Add bidil as tolerated - no ? blocker w/ shock  - Appreciate Palliative recs   2. 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 IR consulted w/ plans to perform cerebral angiogram when stable - no intracardiac source of embolus noted on echo - no afib on tele  - management per neuro and IR - PT/OT recommending SNF   3. AKI - SCr on admit was 1.43, peaked at 1.8.  - Today Cr increased 1.77 - likely due to cardiogenic shock/ cardiorenal  - continue to monitor   4. IDDD - management per primary team  -  on SSI    5. CAD - mild nonobstructive CAD by cath in 2016, w/  10-20% LAD and 20% LCx disease, RCA was small, nondominant w/ no significant coronary plaque - medical management  - no s/s angina  Length of Stay: 9   Terance Ice NP 03/20/2020, 7:38 AM  Advanced Heart Failure Team Pager [phone] (M-F; 7a - 4p)  Please contact [facility] for night-coverage after hours (4p -7a ) and weekends on [email or web address]  Patient seen and examined with the above-signed Advanced Practice Provider and/or Housestaff. I personally reviewed laboratory data, imaging studies and relevant notes. I independently examined the patient and formulated the important aspects of the plan. I have edited the note to reflect any of my changes or salient points. I have personally discussed the plan with the patient and/or family.  Feels ok today. Wants to go home. Denies CP or SOB. Creatinine jumped a bit.   General:  Lying in bed. No resp difficulty HEENT: normal R ptosis Neck: supple. no JVD. Carotids 2+ bilat; no bruits. No lymphadenopathy or thryomegaly appreciated. Cor: PMI nondisplaced. Regular rate & rhythm. No rubs, gallops or murmurs. Lungs: clear Abdomen: soft, nontender, nondistended. No hepatosplenomegaly. No bruits or masses. Good bowel sounds. Extremities: no cyanosis, clubbing, rash, edema Neuro: alert. Moves all 4  Creatinine is up. K is up. Weight down. Suspect she is dry. Will stop lasix and spiro. Can hold Entresto for the am. Give 500cc IVF.   Now off milrinone. Will not restart. Palliative care talks ongoing.   Arvilla Meres, MD  7:57 AM

## 2020-03-20 NOTE — Progress Notes (Signed)
Lab  Made aware about the  blood specimen to be drawn.

## 2020-03-20 NOTE — Progress Notes (Signed)
   Subjective:  Patient seen at bedside this AM. States she ate her breakfast. Feels okay this morning and slept good last night. Remembers seeing cardiologist today. States her belly is hurting. She did not talk to mother today.  Objective:  Vital signs in last 24 hours: Vitals:   03/19/20 1638 03/19/20 1936 03/20/20 0023 03/20/20 0349  BP: 115/90 107/73 96/65   Pulse:  (!) 103  100  Resp: (!) 28 (!) 31    Temp:  97.7 F (36.5 C) 98.1 F (36.7 C) 97.6 F (36.4 C)  TempSrc:  Oral Oral Oral  SpO2:  97%    Weight:    48.1 kg  Height:       Physical Exam: General: Chronically ill-appearing. No acute distress Extremities: Warm, dry.  Skin: Prior R CVC site without erythema. Minimal bruising. Neuro: Awake, lethargic, oriented to person, place.  Assessment/Plan: Ms. Awe is 59yo female with chronic systolic heart failure, type 2 diabetes admitted 11/2 for acute metabolic encephalopathy 2/2 acute right thalamic infarct complicated by cardiogenic shock, now off inotropic support and managing with GDMT.  Principal Problem:   Cardiogenic shock (HCC) Active Problems:   Diabetes mellitus (HCC)   Chronic cholecystitis with calculus   Cerebral thrombosis with cerebral infarction   Acute urinary retention   AKI (acute kidney injury) (HCC)   Goals of care, counseling/discussion   Palliative care by specialist  #Cardiogenic shock #Acute on chronic systolic heart failure Currently managed on GDMT, poor candidate for advanced therapies. Unfortunately poor prognosis given co-morbidities and social situation. Much appreciate recommendations from cardiology and assistance from palliative care. Will continue with GDMT and monitor for signs of decompensation. If patient decompensates, family considering comfort care, hospice.  - C/w digoxin - Holding entresto, spiro, lasix given K+, Cr  #Acute kidney injury #Urinary retention Cr 1.4 >1.7. Likely over-diuresed. Will hold off lasix today,  continue to monitor. - C/w Foley for bladder compression - Holding lasix   #Acute right anterior thalamic infarct #Acute metabolic encephalopathy No acute change in mentation. Holding further imaging for stroke work-up given prognosis of cardiac disease. Continue DAPT. - C/w ASA, Plavix  #Type 2 diabetes mellitus A1c 7.3. CBG 380 this AM despite increasing Lantus yesterday. Will continue increasing Lantus and monitor. Difficult to control given variable po intake. - C/w SSI - Increase Lantus 15  DIET: Renal IVF: n/a DVT PPX: Lovenox BOWEL: Miralax CODE: FULL FAM COM: Palliative has had multiple conversations with mother, family friend, brother, and sister in law. Made DNR today. If patient decompensates family open to comfort care. Can discuss with family further for more discussions.  Prior to Admission Living Arrangement: Home Anticipated Discharge Location: SNF Barriers to Discharge: medical management Dispo: Anticipated discharge in approximately 5-7 day(s).   Evlyn Kanner, MD 03/20/2020, 6:42 AM Pager: [phone] After 5pm on weekdays and 1pm on weekends: On Call pager [phone]

## 2020-03-20 NOTE — Progress Notes (Signed)
Occupational Therapy Treatment Patient Details Name: Christine Benitez MRN: [id number] DOB: Nov 09, 1960 Today's Date: 03/20/2020    History of present illness Pt is a 59 y/o female admitted secondary to AMS and fall. 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 PMH includes CHF, HTN, DM, CAD, and pulm HTN.    OT comments  Patient continues to make progress towards goals in skilled OT session. Patient's session encompassed functional mobility and further assessment of cognition. Pt remains confused in session, with significant difficulty in hearing making it hard to decipher if patient was understanding. Of note, pt perseverative that no one was feeding her, giving her the medication she needs or her clothes to her friend, however pt was eating upon arrival, had already been given pain medication and had all her belongings in a bag next to her. Due to cognition, it was difficult to assess vision, however pt noted to never visually meet either therapist in the eyes and required guiding when manipulating walker or completing transfers. Pt fatigues quickly, shuffling 15 feet x2, and unable to steer walker without assistance (no awareness of environment). Discharge remains appropriate, will continue to follow acutely.    Follow Up Recommendations  SNF    Equipment Recommendations  Other (comment) (defer to next venue)    Recommendations for Other Services      Precautions / Restrictions Precautions Precautions: Fall Precaution Comments: foley catheter; HOH Restrictions Weight Bearing Restrictions: No       Mobility Bed Mobility Overal bed mobility: Needs Assistance Bed Mobility: Supine to Sit;Sit to Supine     Supine to sit: HOB elevated;Min guard     General bed mobility comments: min guard for safety, increased time and effort as well as bedrail assist required to come to EoB  Transfers Overall transfer level: Needs assistance Equipment used: Rolling walker (2 wheeled) Transfers: Sit  to/from Stand Sit to Stand: Min assist;+2 physical assistance         General transfer comment: min Ax2 for power up and steadying at RW from bed and recliner,     Balance Overall balance assessment: Needs assistance Sitting-balance support: Bilateral upper extremity supported;Feet supported Sitting balance-Leahy Scale: Poor Sitting balance - Comments: UE support on bed to maintain static sitting balance, with min guard for safety.   Standing balance support: Bilateral upper extremity supported;During functional activity Standing balance-Leahy Scale: Poor Standing balance comment: B UE support on RW                           ADL either performed or assessed with clinical judgement   ADL Overall ADL's : Needs assistance/impaired Eating/Feeding: Set up               Upper Body Dressing : Moderate assistance   Lower Body Dressing: Supervision/safety Lower Body Dressing Details (indicate cue type and reason): able to adjust socks with extra time and cues due to attention (perseverating over shoes)             Functional mobility during ADLs: Minimal assistance;+2 for safety/equipment;Cueing for sequencing;Cueing for safety General ADL Comments: decreased safety awareness, cognition, coordination and strength     Vision       Perception     Praxis      Cognition Arousal/Alertness: Awake/alert Behavior During Therapy: Impulsive Overall Cognitive Status: No family/caregiver present to determine baseline cognitive functioning Area of Impairment: Attention;Following commands;Safety/judgement;Awareness;Problem solving  Current Attention Level: Sustained   Following Commands: Follows one step commands inconsistently;Follows one step commands with increased time Safety/Judgement: Decreased awareness of safety;Decreased awareness of deficits Awareness: Intellectual Problem Solving: Slow processing;Decreased initiation;Difficulty  sequencing;Requires verbal cues;Requires tactile cues General Comments: difficult for pt to folllow commands due to [facility], however requires maximal cuing for sequencing.         Exercises     Shoulder Instructions       General Comments VSS on RA    Pertinent Vitals/ Pain       Pain Assessment: Faces Faces Pain Scale: No hurt Pain Intervention(s): Patient requesting pain meds-RN notified  Home Living                                          Prior Functioning/Environment              Frequency  Min 2X/week        Progress Toward Goals  OT Goals(current goals can now be found in the care plan section)  Progress towards OT goals: Progressing toward goals  Acute Rehab OT Goals Patient Stated Goal: to be able to walk and go home OT Goal Formulation: With patient Time For Goal Achievement: 03/26/20 Potential to Achieve Goals: Good  Plan Discharge plan remains appropriate    Co-evaluation      Reason for Co-Treatment: Complexity of the patient's impairments (multi-system involvement);Necessary to address cognition/behavior during functional activity PT goals addressed during session: Mobility/safety with mobility OT goals addressed during session: ADL's and self-care      AM-PAC OT "6 Clicks" Daily Activity     Outcome Measure   Help from another person eating meals?: None Help from another person taking care of personal grooming?: A Little Help from another person toileting, which includes using toliet, bedpan, or urinal?: A Little Help from another person bathing (including washing, rinsing, drying)?: A Little Help from another person to put on and taking off regular upper body clothing?: A Little Help from another person to put on and taking off regular lower body clothing?: A Little 6 Click Score: 19    End of Session Equipment Utilized During Treatment: Gait belt;Rolling walker  OT Visit Diagnosis: Unsteadiness on feet (R26.81);Other  abnormalities of gait and mobility (R26.89);Muscle weakness (generalized) (M62.81);Other symptoms and signs involving the nervous system (R29.898);Other symptoms and signs involving cognitive function;Hemiplegia and hemiparesis Hemiplegia - Right/Left: Left Hemiplegia - dominant/non-dominant: Non-Dominant   Activity Tolerance Patient limited by lethargy   Patient Left in chair;with call bell/phone within reach;with chair alarm set;with family/visitor present   Nurse Communication Mobility status        Time: 6270-3500 OT Time Calculation (min): 27 min  Charges: OT General Charges $OT Visit: 1 Visit OT Treatments $Self Care/Home Management : 8-22 mins  Pollyann Glen E. Quan Cybulski, COTA/L Acute Rehabilitation Services [phone]   Cherlyn Cushing 03/20/2020, 3:03 PM

## 2020-03-20 NOTE — Progress Notes (Signed)
Inpatient Diabetes Program Recommendations  AACE/ADA: New Consensus Statement on Inpatient Glycemic Control (2015)  Target Ranges:  Prepandial:   less than 140 mg/dL      Peak postprandial:   less than 180 mg/dL (1-2 hours)      Critically ill patients:  140 - 180 mg/dL   Lab Results  Component Value Date   GLUCAP 222 (H) 03/20/2020   HGBA1C 7.3 (H) 03/12/2020   Diabetes history: Type 2 DM Outpatient Diabetes medications: Novolog 75/25 20 units QAM, 30 units QPM, Metformin 500 mg BID Current orders for Inpatient glycemic control: Novolog 0-15 units TID, Novolog 4 units TID, Lantus 15 units QHS  Inpatient Diabetes Program Recommendations:    Noted hypoglycemic event yesterday of 58 mg/dL. With renal status and risk for hypoglycemia, would consider decreasing correction to Novolog 0-6 units TID.   Thanks, Lujean Rave, MSN, RNC-OB Diabetes Coordinator [phone] (8a-5p)

## 2020-03-20 NOTE — Progress Notes (Signed)
Confused , would take out telemetry monitor, explained the significance of it. Continue to monitor.

## 2020-03-20 NOTE — Progress Notes (Signed)
Physical Therapy Treatment Patient Details Name: Christine Benitez MRN: [id number] DOB: 1960/09/12 Today's Date: 03/20/2020    History of Present Illness Pt is a 59 y/o female admitted secondary to AMS and fall. 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 PMH includes CHF, HTN, DM, CAD, and pulm HTN.     PT Comments    Pt continues to be confused about situation and needs redirecting to task at hand. Pt is agreeable to ambulation with therapy, however repeats multiple times that she is afraid of falling. Pt able to progress her mobility today. Pt is min guard for bed mobility, requires minAx2 for transfers and modA and close chair follow for 2 bouts of ambulation 18 feet with RW. D/c plan remains appropriate. PT will continue to follow acutely.   Follow Up Recommendations  SNF;Supervision/Assistance - 24 hour     Equipment Recommendations   (TBD)       Precautions / Restrictions Precautions Precautions: Fall Precaution Comments: foley catheter; HOH Restrictions Weight Bearing Restrictions: No    Mobility  Bed Mobility Overal bed mobility: Needs Assistance Bed Mobility: Supine to Sit;Sit to Supine     Supine to sit: HOB elevated;Min guard     General bed mobility comments: min guard for safety, increased time and effort as well as bedrail assist required to come to EoB  Transfers Overall transfer level: Needs assistance Equipment used: Rolling walker (2 wheeled) Transfers: Sit to/from Stand Sit to Stand: Min assist;+2 physical assistance         General transfer comment: min Ax2 for power up and steadying at RW from bed and recliner,   Ambulation/Gait Ambulation/Gait assistance: Mod assist Gait Distance (Feet): 18 Feet (2x18) Assistive device: Rolling walker (2 wheeled) Gait Pattern/deviations: Shuffle;Trunk flexed;Decreased stride length;Decreased step length - left;Decreased stance time - left;Decreased dorsiflexion - left;Narrow base of support;Step-through pattern Gait  velocity: decreased Gait velocity interpretation: <1.31 ft/sec, indicative of household ambulator General Gait Details: modA at hips to steady, +2 for close chair follow, continues to have decreased L LE strength however is progressing toward step through gait, constant multimodal cuing for proximity to RW      Modified Rankin (Stroke Patients Only) Modified Rankin (Stroke Patients Only) Pre-Morbid Rankin Score: No significant disability Modified Rankin: Moderately severe disability     Balance Overall balance assessment: Needs assistance Sitting-balance support: Bilateral upper extremity supported;Feet supported Sitting balance-Leahy Scale: Poor Sitting balance - Comments: UE support on bed to maintain static sitting balance, with min guard for safety.   Standing balance support: Bilateral upper extremity supported;During functional activity Standing balance-Leahy Scale: Poor Standing balance comment: B UE support on RW                            Cognition Arousal/Alertness: Awake/alert Behavior During Therapy: Impulsive Overall Cognitive Status: No family/caregiver present to determine baseline cognitive functioning Area of Impairment: Attention;Following commands;Safety/judgement;Awareness;Problem solving                   Current Attention Level: Sustained   Following Commands: Follows one step commands inconsistently;Follows one step commands with increased time Safety/Judgement: Decreased awareness of safety;Decreased awareness of deficits Awareness: Intellectual Problem Solving: Slow processing;Decreased initiation;Difficulty sequencing;Requires verbal cues;Requires tactile cues General Comments: difficult for pt to folllow commands due to [facility], however requires maximal cuing for sequencing.          General Comments General comments (skin integrity, edema, etc.): VSS on RA  Pertinent Vitals/Pain Pain Assessment: Faces Faces Pain Scale: No  hurt Pain Intervention(s): Patient requesting pain meds-RN notified           PT Goals (current goals can now be found in the care plan section) Acute Rehab PT Goals Patient Stated Goal: to be able to walk and go home PT Goal Formulation: With patient Time For Goal Achievement: 03/26/20 Potential to Achieve Goals: Fair Progress towards PT goals: Progressing toward goals    Frequency    Min 3X/week      PT Plan Current plan remains appropriate    Co-evaluation PT/OT/SLP Co-Evaluation/Treatment: Yes Reason for Co-Treatment: Complexity of the patient's impairments (multi-system involvement);Necessary to address cognition/behavior during functional activity PT goals addressed during session: Mobility/safety with mobility OT goals addressed during session: ADL's and self-care      AM-PAC PT "6 Clicks" Mobility   Outcome Measure  Help needed turning from your back to your side while in a flat bed without using bedrails?: A Little Help needed moving from lying on your back to sitting on the side of a flat bed without using bedrails?: A Little Help needed moving to and from a bed to a chair (including a wheelchair)?: A Little Help needed standing up from a chair using your arms (e.g., wheelchair or bedside chair)?: A Little Help needed to walk in hospital room?: A Little Help needed climbing 3-5 steps with a railing? : A Lot 6 Click Score: 17    End of Session Equipment Utilized During Treatment: Gait belt Activity Tolerance: Patient tolerated treatment well;Patient limited by fatigue Patient left: with call bell/phone within reach;with family/visitor present;with chair alarm set;in chair Nurse Communication: Mobility status PT Visit Diagnosis: Unsteadiness on feet (R26.81);Muscle weakness (generalized) (M62.81);Other symptoms and signs involving the nervous system (R29.898);Other abnormalities of gait and mobility (R26.89);Difficulty in walking, not elsewhere classified  (R26.2)     Time: 7989-2119 PT Time Calculation (min) (ACUTE ONLY): 26 min  Charges:  $Gait Training: 8-22 mins                     Christine Benitez PT, DPT Acute Rehabilitation Services Pager [phone] Office [phone]    Elon Alas Fleet 03/20/2020, 2:02 PM

## 2020-03-20 NOTE — Progress Notes (Signed)
Daily Progress Note   Patient Name: Christine Benitez       Date: 03/20/2020 DOB: 08-20-1960  Age: 59 y.o. MRN#: [id number] Attending Physician: Tyson Alias, * Primary Care Physician: Ailene Ravel, MD Admit Date: 03/11/2020  Reason for Consultation/Follow-up: Establishing goals of care  Subjective: Chart review performed. Received report from primary RN - no acute concerns. RN reports patient ate about 40-50% of her breakfast.   Went to visit patient at bedside - no family/visitors present. Patient was lying in bed awake, alert, disoriented (to place and situation), and able to participate in conversation. I asked the patient how she was feeling today - patient replied "I'm having a hard time getting food and my medications." Patient asked if she could get tylenol for her lower back pain - RN had already recently administered - reminded the patient she had already received dose and needed to allow time for it to work. No signs or non-verbal gestures of pain or discomfort noted. No respiratory distress, increased work of breathing, or secretions noted.   Called patient's mother/[name] to provide updates and continue GOC conversation. Discussed my visit with patient today, milrinone, and slightly worsening renal function. Discussed the patient's fragility. [name] is hopeful for the patient will recover and would like to continue watchful waiting for the next 24 hours off milrinone (for a total of 48 hours). [name] seems open to comfort measures/hospice support if the patient declines. Also discussed that if the patient remains stable, she may need additional support at home /SNF/long term care on discharge since she is originally from home alone - [name] expressed understanding.   [name]  expressed the patient had two children - her daughter whom is currently incarcerated and a son that is not involved with the patient.  Encouraged [name] to consider DNR/DNI status for the patient understanding evidenced based poor outcomes in similar hospitalized patient, as the cause of arrest is likely associated with advanced chronic illness rather than an easily reversible acute cardio-pulmonary event. [name] was agreeable to DNR/DNI with understanding that the patient would not receive CPR, defibrillation, ACLS medications, or intubation. [name] stated that she does not want to cause additional suffering for the patient.  All questions and concerns addressed. Encouraged to call with questions and/or concerns. PMT number was provided.   Length of  Stay: 9  Current Medications: Scheduled Meds:  . aspirin EC  81 mg Oral Daily  . atorvastatin  40 mg Oral QPM  . Chlorhexidine Gluconate Cloth  6 each Topical Daily  . clopidogrel  75 mg Oral Daily  . digoxin  0.0625 mg Oral Daily  . enoxaparin (LOVENOX) injection  30 mg Subcutaneous Q24H  . feeding supplement  237 mL Oral BID BM  . insulin aspart  0-15 Units Subcutaneous TID WC  . insulin aspart  4 Units Subcutaneous TID WC  . insulin glargine  15 Units Subcutaneous QHS  . sodium chloride flush  10-40 mL Intracatheter Q12H    Continuous Infusions: . sodium chloride      PRN Meds: acetaminophen **OR** acetaminophen, ondansetron **OR** ondansetron (ZOFRAN) IV, polyethylene glycol, sodium chloride flush  Physical Exam Vitals and nursing note reviewed.  Constitutional:      General: She is not in acute distress.    Appearance: She is ill-appearing.     Comments: Frail appearing  HENT:     Right Ear: Decreased hearing noted.     Left Ear: Decreased hearing noted.  Pulmonary:     Effort: No respiratory distress.  Skin:    General: Skin is warm and dry.  Neurological:     Mental Status: She is alert. She is disoriented and confused.      Motor: Weakness present.  Psychiatric:        Attention and Perception: Attention normal.        Behavior: Behavior is cooperative.        Cognition and Memory: Cognition is impaired. Memory is impaired.             Vital Signs: BP 121/88 (BP Location: Right Arm)   Pulse 97   Temp 97.7 F (36.5 C) (Oral)   Resp 20   Ht 5\' 4"  (1.626 m)   Wt 48.1 kg   SpO2 94%   BMI 18.20 kg/m  SpO2: SpO2: 94 % O2 Device: O2 Device: Room Air O2 Flow Rate: O2 Flow Rate (L/min): 1 L/min  Intake/output summary:   Intake/Output Summary (Last 24 hours) at 03/20/2020 13/03/2020 Last data filed at 03/20/2020 0357 Gross per 24 hour  Intake --  Output 3150 ml  Net -3150 ml   LBM: Last BM Date: 03/19/20 Baseline Weight: Weight: 53.4 kg Most recent weight: Weight: 48.1 kg       Palliative Assessment/Data: PPS 50%      Patient Active Problem List   Diagnosis Date Noted  . Goals of care, counseling/discussion   . Palliative care by specialist   . AKI (acute kidney injury) (HCC)   . Cardiogenic shock (HCC) 03/14/2020  . Acute urinary retention 03/13/2020  . Chronic cholecystitis with calculus 03/12/2020  . Cerebral thrombosis with cerebral infarction 03/12/2020  . Abnormal LFTs   . Hypertension   . NICM (nonischemic cardiomyopathy) (HCC)   . Pulmonary hypertension (HCC)   . Tobacco abuse 06/10/2014  . Diabetes mellitus (HCC) 06/10/2014  . Family history of premature CAD 06/10/2014    Palliative Care Assessment & Plan   Patient Profile: 59 y.o. female  with past medical history of CHF (EF 30%), NICM, hypertension, pulmonary hypertensiontype diabetes admitted on 03/11/2020 with altered mental status related to  Right thalamic cerebral infarction. Hospitalization complicated by cardiogenic shock. Now appears to be inotrope dependent but not a considered to be a good candidate for home inotrope therapy.   Assessment: Cardiogenic shock Acute on chronic systolic heart failure  Acute  CVA AKI Diabetes mellitus Cerebral thrombosis with cerebral infarction Acute urinary retention  Recommendations/Plan: Continue current medical treatment with watchful waiting over the next 24 hours off milrinone (for a total of 48 hours - milrinone was stopped 11/10) Initiated DNR/DNI per conversation with patient's mother today  Mother is open to comfort care/hospice if patient declines PMT will continue to follow holistically   Goals of Care and Additional Recommendations: Limitations on Scope of Treatment: Full Scope Treatment  Code Status:    Code Status Orders  (From admission, onward)         Start     Ordered   03/11/20 1817  Full code  Continuous        03/11/20 1821        Code Status History    Date Active Date Inactive Code Status Order ID Comments User Context   06/26/2015 2305 06/29/2015 1548 DNR [id number]  Araceli Bouche, DO ED   06/11/2014 1125 06/11/2014 2028 Full Code [id number]  [place], Peter M, MD Inpatient   06/10/2014 2118 06/11/2014 1125 Full Code [id number]  Glori Luis, MD Inpatient   Advance Care Planning Activity      Prognosis: Poor due to comorbidities and fragility    Discharge Planning: To Be Determined  Care plan was discussed with primary RN, Dr. Marijo Conception, Dr. Pamella Pert, patient's mother  Thank you for allowing the Palliative Medicine Team to assist in the care of this patient.   Total Time 35 minutes Prolonged Time Billed  no       Greater than 50%  of this time was spent counseling and coordinating care related to the above assessment and plan.  Haskel Khan, NP  Please contact Palliative Medicine Team phone at [phone] for questions and concerns.

## 2020-03-20 NOTE — Progress Notes (Signed)
Pt. BS sugar at 2205 was 58, reported by on shift NA. Patient asymptomatic and talking. Monitored in room by RN until follow up BS was taken. Given cup of orange juice. Subsequent BS was 71 and following one shortly after midnight was 280.

## 2020-03-20 NOTE — Progress Notes (Signed)
  Speech Language Pathology Treatment: Dysphagia;Cognitive-Linquistic  Patient Details Name: Christine Benitez MRN: [id number] DOB: 1961-04-18 Today's Date: 03/20/2020 Time: 4982-6415 SLP Time Calculation (min) (ACUTE ONLY): 24 min  Assessment / Plan / Recommendation Clinical Impression  Pt was seen for treatment. She was alert throughout the session, but appeared more confused than when she was last seen by this SLP. She tolerated solids without overt s/sx of aspiration. Mastication was prolonged with trials of regular texture solids and pocketing was noted to the left. Oral residue was cleared with 2-3 liquid washes. A single cough was noted with 1/13 boluses of thin liquids via straw when pt's positioning was sub-optimal, but no other signs of aspiration were noted. Pt was disoriented to place upon SLP's arrival. She required cueing for focused attention and her responses were consistently unrelated to questions despite rephrasing of questions and attempts to redirect pt. She initially expressed, "If I want something down there I just go ahead and get it." and most subsequent responses were variations thereof. For example, she also stated, "I just get it.", "I just go down there and get it.", and "I just go down there." Upon inquiry, pt stated that "there" was "At the room down there" and later asked the SLP "You see anything else down there?" However, she was unable to participate in structured cognitive-linguistic tasks during the session. SLP will continue to follow for cognitive-linguistic treatment.   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 Pt is a poor historian and family was not present during BSE.  Decreased  alertness level noted with HOH status exacerbating cognitive deficits.      SLP Plan  Continue with current plan of care       Recommendations  Diet recommendations: Dysphagia 2 (fine chop);Thin liquid Liquids provided via: Cup;Straw Medication Administration: Crushed with puree Supervision: Staff to assist with self feeding Compensations: Slow rate;Small sips/bites Postural Changes and/or Swallow Maneuvers: Seated upright 90 degrees                Oral Care Recommendations: Oral care BID Follow up Recommendations: Skilled Nursing facility SLP Visit Diagnosis: Cognitive communication deficit (R41.841);Dysphagia, unspecified (R13.10) Plan: Continue with current plan of care       Ragan Duhon I. Vear Clock, MS, CCC-SLP Acute Rehabilitation Services Office number [phone] Pager [phone]                Scheryl Marten 03/20/2020, 4:01 PM

## 2020-03-21 DIAGNOSIS — I5023 Acute on chronic systolic (congestive) heart failure: Secondary | ICD-10-CM | POA: Diagnosis not present

## 2020-03-21 DIAGNOSIS — R52 Pain, unspecified: Secondary | ICD-10-CM

## 2020-03-21 DIAGNOSIS — R570 Cardiogenic shock: Secondary | ICD-10-CM | POA: Diagnosis not present

## 2020-03-21 LAB — CBC
HCT: 49.9 % — ABNORMAL HIGH (ref 36.0–46.0)
Hemoglobin: 15.9 g/dL — ABNORMAL HIGH (ref 12.0–15.0)
MCH: 30.8 pg (ref 26.0–34.0)
MCHC: 31.9 g/dL (ref 30.0–36.0)
MCV: 96.5 fL (ref 80.0–100.0)
Platelets: 261 10*3/uL (ref 150–400)
RBC: 5.17 MIL/uL — ABNORMAL HIGH (ref 3.87–5.11)
RDW: 18.4 % — ABNORMAL HIGH (ref 11.5–15.5)
WBC: 5.6 10*3/uL (ref 4.0–10.5)
nRBC: 0 % (ref 0.0–0.2)

## 2020-03-21 LAB — BASIC METABOLIC PANEL
Anion gap: 13 (ref 5–15)
BUN: 20 mg/dL (ref 6–20)
CO2: 18 mmol/L — ABNORMAL LOW (ref 22–32)
Calcium: 9.1 mg/dL (ref 8.9–10.3)
Chloride: 101 mmol/L (ref 98–111)
Creatinine, Ser: 1.54 mg/dL — ABNORMAL HIGH (ref 0.44–1.00)
GFR, Estimated: 39 mL/min — ABNORMAL LOW (ref >60)
Glucose, Bld: 270 mg/dL — ABNORMAL HIGH (ref 70–99)
Potassium: 5.2 mmol/L — ABNORMAL HIGH (ref 3.5–5.1)
Sodium: 132 mmol/L — ABNORMAL LOW (ref 135–145)

## 2020-03-21 LAB — GLUCOSE, CAPILLARY
Glucose-Capillary: 151 mg/dL — ABNORMAL HIGH (ref 70–99)
Glucose-Capillary: 161 mg/dL — ABNORMAL HIGH (ref 70–99)
Glucose-Capillary: 234 mg/dL — ABNORMAL HIGH (ref 70–99)
Glucose-Capillary: 237 mg/dL — ABNORMAL HIGH (ref 70–99)
Glucose-Capillary: 244 mg/dL — ABNORMAL HIGH (ref 70–99)
Glucose-Capillary: 252 mg/dL — ABNORMAL HIGH (ref 70–99)
Glucose-Capillary: 255 mg/dL — ABNORMAL HIGH (ref 70–99)
Glucose-Capillary: 46 mg/dL — ABNORMAL LOW (ref 70–99)

## 2020-03-21 MED ORDER — TRAMADOL HCL 50 MG PO TABS
50.0000 mg | ORAL_TABLET | Freq: Four times a day (QID) | ORAL | Status: DC | PRN
  Administered 2020-03-22 – 2020-03-25 (×3): 50 mg via ORAL
  Filled 2020-03-21 (×3): qty 1

## 2020-03-21 MED ORDER — INSULIN ASPART 100 UNIT/ML ~~LOC~~ SOLN
6.0000 [IU] | Freq: Three times a day (TID) | SUBCUTANEOUS | Status: DC
  Administered 2020-03-21 – 2020-03-22 (×3): 6 [IU] via SUBCUTANEOUS

## 2020-03-21 MED ORDER — DEXTROSE 50 % IV SOLN
INTRAVENOUS | Status: AC
  Filled 2020-03-21: qty 50

## 2020-03-21 MED ORDER — SODIUM ZIRCONIUM CYCLOSILICATE 5 G PO PACK
5.0000 g | PACK | Freq: Once | ORAL | Status: AC
  Administered 2020-03-21: 5 g via ORAL
  Filled 2020-03-21: qty 1

## 2020-03-21 MED ORDER — INSULIN ASPART 100 UNIT/ML ~~LOC~~ SOLN
0.0000 [IU] | Freq: Three times a day (TID) | SUBCUTANEOUS | Status: DC
  Administered 2020-03-21: 2 [IU] via SUBCUTANEOUS
  Administered 2020-03-21: 3 [IU] via SUBCUTANEOUS
  Administered 2020-03-21: 1 [IU] via SUBCUTANEOUS
  Administered 2020-03-22: 3 [IU] via SUBCUTANEOUS
  Administered 2020-03-22: 1 [IU] via SUBCUTANEOUS
  Administered 2020-03-22: 2 [IU] via SUBCUTANEOUS
  Administered 2020-03-23: 1 [IU] via SUBCUTANEOUS
  Administered 2020-03-23: 2 [IU] via SUBCUTANEOUS
  Administered 2020-03-23 – 2020-03-24 (×2): 1 [IU] via SUBCUTANEOUS
  Administered 2020-03-24: 2 [IU] via SUBCUTANEOUS
  Administered 2020-03-25: 3 [IU] via SUBCUTANEOUS

## 2020-03-21 MED ORDER — DEXTROSE 50 % IV SOLN
1.0000 | INTRAVENOUS | Status: AC
  Administered 2020-03-21: 50 mL via INTRAVENOUS

## 2020-03-21 NOTE — Progress Notes (Signed)
   Subjective:  Patient seen at bedside this AM. Patient is eating breakfast this morning. She endorses feeling cold and is requesting more blankets. She says she feels okay overall but does endorse some back pain. No chest pain. Requesting tylenol for her back pain. She notes that she has not been able to talk to her mother the past few days and is requesting to talk to her.   Objective:  Vital signs in last 24 hours: Vitals:   03/20/20 2008 03/20/20 2359 03/21/20 0347 03/21/20 0504  BP: 104/71 111/90 107/69   Pulse: 88 98 99   Resp: 20 16 20    Temp: 98.6 F (37 C) 98.3 F (36.8 C) 98.4 F (36.9 C)   TempSrc: Oral Oral Oral   SpO2: 98% 98% 97%   Weight:    47.8 kg  Height:       Physical Exam: General: Chronically ill-appearing. No acute distress Extremities: Warm, dry. No rashes or skin breakdown seen. Neuro: Awake, alert. Oriented to person, place.  Assessment/Plan: Ms. Burdin is 59yo female with chronic systolic heart failure, type 2 diabetes admitted 11/2 for acute metabolic encephalopathy 2/2 acute right thalamic infarct complicated by cardiogenic shock, now off inotropic support, managed with GDMT, now planning for SNF placement with palliative services.  Principal Problem:   Cardiogenic shock (HCC) Active Problems:   Diabetes mellitus (HCC)   Chronic cholecystitis with calculus   Cerebral thrombosis with cerebral infarction   Acute urinary retention   AKI (acute kidney injury) (HCC)   Goals of care, counseling/discussion   Palliative care by specialist   Altered mental status   DNR (do not resuscitate)   DNI (do not intubate)   Encounter for hospice care discussion  #Cardiogenic shock #Acute on chronic systolic heart failure Continuing to manage on GDMT, cardiology following. She is a poor candidate for advanced therapies and has poor prognosis given her co-morbidities and social situation. Will continue working with cardiology and palliative for optimal  disposition. If patient decompensates, family considering comfort care and hospice. - C/w digoxin - Per cards, holding lasix, spiro, entresto today - Re-start Entresto if K < 5, SBP >110 (if SBP<110 use losartan 25)  #Acute kidney injury #Urinary retention Cr 1.7 > 1.5. Will continue holding lasix, can re-start with MWF schedule or if becomes volume overloaded over the next few days. - C/w Foley for bladder compression - Holding lasix  #Acute right anterior thalamic infarct #Acute metabolic encephalopathy Mentation stable, no need for further work-up or imaging at this time given state of cardiac function. Continue DAPT. - C/w ASA, Plavix  #Type 2 diabetes mellitus A1c 7.3. CBG in 200's this morning. Will increase Lantus and meal-time coverage today. - Increase Lantus 18u - Increase Novolog 6u TID WC - C/w SSI  DIET: Renal IVF: n/a DVT PPX: Lovenox BOWEL: Miralax CODE: DNR FAM COM: Palliative in communication with family. Plan to call mother today for medical update.  Prior to Admission Living Arrangement: Home Anticipated Discharge Location: SNF Barriers to Discharge: medical management Dispo: Anticipated discharge in approximately 3-4 day(s).   13/2, MD 03/21/2020, 5:26 AM Pager: [phone] After 5pm on weekdays and 1pm on weekends: On Call pager [phone]

## 2020-03-21 NOTE — Progress Notes (Addendum)
Daily Progress Note   Patient Name: Christine Benitez       Date: 03/21/2020 DOB: [birth year]  Age: 59 y.o. MRN#: [id number] Attending Physician: Tyson Alias, * Primary Care Physician: Ailene Ravel, MD Admit Date: 03/11/2020  Reason for Consultation/Follow-up: Establishing goals of care  Subjective: Chart review performed. Received report from primary RN - no acute concerns. RN states patient is eating/drinking and taking medications, but is still complaining of back pain after receiving tylenol.   Went to visit patient at bedside - no family/visitors present. Patient was lying in bed asleep and resting comfortably. No signs or non-verbal gestures of pain or discomfort noted. No respiratory distress, increased work of breathing, or secretions noted. I did not attempt to wake her.  Called patient's mother/[name] - she states she was able to visit the patient today and speak with cardiologist. We reviewed the information she received from cardiology. Discussed disposition options - [name] is hopeful the patient can discharge to a rehab facility to try and regain some of her strength. She does understand that, depending on disease course, the patient may not be able to return to work - she was requesting information on how to start disability paperwork for the patient - I told her I would have our TOC team reach out to her with more information. Outpatient Palliative Care services were explained and offered - [name] was agreeable and appreciative.   All questions and concerns addressed. Encouraged to call with questions and/or concerns. PMT card provided.   Length of Stay: 10  Current Medications: Scheduled Meds:  . aspirin EC  81 mg Oral Daily  . atorvastatin  40 mg Oral QPM  .  Chlorhexidine Gluconate Cloth  6 each Topical Daily  . clopidogrel  75 mg Oral Daily  . digoxin  0.0625 mg Oral Daily  . enoxaparin (LOVENOX) injection  30 mg Subcutaneous Q24H  . feeding supplement  237 mL Oral BID BM  . insulin aspart  0-6 Units Subcutaneous TID WC  . insulin aspart  6 Units Subcutaneous TID WC  . insulin glargine  15 Units Subcutaneous QHS  . lidocaine  1 patch Transdermal Q24H  . sodium chloride flush  10-40 mL Intracatheter Q12H    Continuous Infusions:   PRN Meds: acetaminophen **OR** acetaminophen, ondansetron **OR** ondansetron (ZOFRAN) IV,  polyethylene glycol, sodium chloride flush  Physical Exam Vitals and nursing note reviewed.  Constitutional:      General: She is not in acute distress. Pulmonary:     Effort: No respiratory distress.  Skin:    General: Skin is warm and dry.  Neurological:     Motor: Weakness present.     Comments: Sleeping/resting comfortably             Vital Signs: BP (!) 86/62 (BP Location: Right Arm)   Pulse 91   Temp 98.1 F (36.7 C) (Oral)   Resp 19   Ht 5\' 4"  (1.626 m)   Wt 47.8 kg   SpO2 92%   BMI 18.09 kg/m  SpO2: SpO2: 92 % O2 Device: O2 Device: Room Air O2 Flow Rate: O2 Flow Rate (L/min): 1 L/min  Intake/output summary:   Intake/Output Summary (Last 24 hours) at 03/21/2020 1609 Last data filed at 03/21/2020 0347 Gross per 24 hour  Intake 150 ml  Output 1175 ml  Net -1025 ml   LBM: Last BM Date: 03/19/20 Baseline Weight: Weight: 53.4 kg Most recent weight: Weight: 47.8 kg       Palliative Assessment/Data: PPS 40%      Patient Active Problem List   Diagnosis Date Noted  . Altered mental status   . DNR (do not resuscitate)   . DNI (do not intubate)   . Encounter for hospice care discussion   . Goals of care, counseling/discussion   . Palliative care by specialist   . AKI (acute kidney injury) (HCC)   . Cardiogenic shock (HCC) 03/14/2020  . Acute urinary retention 03/13/2020  . Chronic  cholecystitis with calculus 03/12/2020  . Cerebral thrombosis with cerebral infarction 03/12/2020  . Abnormal LFTs   . Hypertension   . NICM (nonischemic cardiomyopathy) (HCC)   . Pulmonary hypertension (HCC)   . Tobacco abuse 06/10/2014  . Diabetes mellitus (HCC) 06/10/2014  . Family history of premature CAD 06/10/2014    Palliative Care Assessment & Plan   Patient Profile: 59 y.o.femalewith past medical history of CHF (EF 30%), NICM, hypertension, pulmonary hypertensiontype diabetesadmitted on 11/2/2021with altered mental status related to Right thalamic cerebral infarction.Hospitalization complicated by cardiogenic shock. Now appears to be inotrope dependent but not a considered to be a good candidate for home inotrope therapy.  Assessment: Cardiogenic shock Acute on chronic systolic heart failure Acute CVA AKI Diabetes mellitus Cerebral thrombosis with cerebral infarction Acute urinary retention  Recommendations/Plan: Continue current medical treatment Continue DNR/DNI as previously documented - durable DNR form completed; original placed in shadow chart and copy will be scanned into [employer] Patient's mother is agreeable to SNF rehab on discharge  TOC notified and consult placed for: outpatient Palliative Care referral and mother requests information on how to start disability paperwork for the patient Initiated tramadol 50mg  PO q6h for moderate pain  PMT will continue to follow peripherally. If there are any imminent needs please call the service directly  Goals of Care and Additional Recommendations: Limitations on Scope of Treatment: Full Scope Treatment  Code Status:    Code Status Orders  (From admission, onward)         Start     Ordered   03/20/20 1015  Do not attempt resuscitation (DNR)  Continuous       Question Answer Comment  In the event of cardiac or respiratory ARREST Do not call a "code blue"   In the event of cardiac or respiratory ARREST Do  not perform Intubation,  CPR, defibrillation or ACLS   In the event of cardiac or respiratory ARREST Use medication by any route, position, wound care, and other measures to relive pain and suffering. May use oxygen, suction and manual treatment of airway obstruction as needed for comfort.      03/20/20 1014        Code Status History    Date Active Date Inactive Code Status Order ID Comments User Context   03/11/2020 1821 03/20/2020 1014 Full Code [id number]  Eliezer Bottom, MD ED   06/26/2015 2305 06/29/2015 1548 DNR [id number]  Araceli Bouche, DO ED   06/11/2014 1125 06/11/2014 2028 Full Code [id number]  [place], Peter M, MD Inpatient   06/10/2014 2118 06/11/2014 1125 Full Code [id number]  Glori Luis, MD Inpatient   Advance Care Planning Activity      Prognosis:  Unable to determine  Discharge Planning: Skilled Nursing Facility for rehab with Palliative care service follow-up  Care plan was discussed with primary RN, mother/[name], Dr. Marijo Conception, [facility]  Thank you for allowing the Palliative Medicine Team to assist in the care of this patient.   Total Time 30 minutes Prolonged Time Billed  no       Greater than 50%  of this time was spent counseling and coordinating care related to the above assessment and plan.  Haskel Khan, NP  Please contact Palliative Medicine Team phone at [phone] for questions and concerns.

## 2020-03-21 NOTE — TOC Progression Note (Addendum)
Transition of Care [facility]) - Progression Note    Patient Details  Name: Christine Benitez MRN: [id number] Date of Birth: [birth year]  [facility] CM/SW Contact  Carmina Miller, LCSWA Phone Number: 03/21/2020, 2:54 PM  Clinical Narrative:    Update: CSW spoke with pt's mother Christine Benitez, she is ok with pt going to [facility] when medically stable. SNF notified. Christine Benitez also requested assistance starting the disability process for pt, CSW emailed financial counseling team.   CSW spoke with pt at bedside, stated she doesn't want to go to SNF, wants to go home but has no support. [facility] can take pt when medically stable, they will start auth if family chooses SNF. CSW reached out to MD who agrees pt needs SNF. CSW called pt's mother, Christine Benitez twice, no answer, no vm. MD also tried to call Christine Benitez but didn't get an answer either. CSW will continue to follow.    Expected Discharge Plan: Skilled Nursing Facility Barriers to Discharge: Continued Medical Work up, English as a second language teacher  Expected Discharge Plan and Services Expected Discharge Plan: Skilled Nursing Facility In-house Referral: Clinical Social Work   Post Acute Care Choice: Skilled Nursing Facility Living arrangements for the past 2 months: Single Family Home                                       Social Determinants of Health (SDOH) Interventions    Readmission Risk Interventions No flowsheet data found.

## 2020-03-21 NOTE — Progress Notes (Addendum)
Advanced Heart Failure Rounding Note   Subjective:    Central access placed 11/5. Co-ox 45%. Started on milrinone which was stopped on 11/10 after IJ was pulled out, co-ox at that time 57% w/ CVP 6.   Yesterday: Held lasix, spiro and entresto due to elevated Cr and K+. Received 500 mL bolus of NSS.     Today: Laying in bed, eating breakfast.  Reports only complaint is back pain.  Denies chest pain, shortness of breath and dizziness.        cMRI 11/7  1.LVEF 30%. EVEF 42%  2. Moderate-severe MR and severe TR 3. Non-coronary LGE pattern with extensive mid-myocardial scar in the basal to mid septal, inferior, and lateral walls. This could be consistent with prior myocarditis versus cardiac amyloidosis. Wall thickness is not particularly impressive for amyloidosis and ECV percentage is only 33% (mildly elevated, often see higher with amyloidosis).  Objective:   Weight Range:  Vital Signs:   Temp:  [97.7 F (36.5 C)-98.7 F (37.1 C)] 98.4 F (36.9 C) (11/12 0347) Pulse Rate:  [88-99] 99 (11/12 0347) Resp:  [16-20] 20 (11/12 0347) BP: (104-121)/(69-90) 107/69 (11/12 0347) SpO2:  [94 %-98 %] 97 % (11/12 0347) Weight:  [47.8 kg] 47.8 kg (11/12 0504) Last BM Date: 03/19/20  Weight change: Filed Weights   03/19/20 0500 03/20/20 0349 03/21/20 0504  Weight: 49.2 kg 48.1 kg 47.8 kg    Intake/Output:   Intake/Output Summary (Last 24 hours) at 03/21/2020 0642 Last data filed at 03/21/2020 0347 Gross per 24 hour  Intake 980 ml  Output 1175 ml  Net -195 ml     Physical Exam:  General:  Well appearing. No resp difficulty HEENT: very HOH.  Lid lag.  Neck: supple. no JVD. Carotids 2+ bilat. Cor: PMI nondisplaced. Regular rate & rhythm. No rubs, gallops or murmurs. Lungs: clear bilaterally.  Abdomen: soft, nontender, nondistended. Bowel sounds active.   Extremities: no cyanosis, clubbing, rash, edema Neuro: alert & oriented, moves all 4 extremities w/o difficulty. Affect  flat.   Telemetry: SR rates 90s.  Personally reviewed.    Labs: Basic Metabolic Panel: Recent Labs  Lab 03/15/20 2205 03/16/20 0316 03/19/20 0425 03/19/20 0425 03/20/20 0019 03/20/20 0019 03/20/20 0655 03/20/20 1205 03/21/20 0024  NA 138   < > 133*  --  133*  --  130* 134* 132*  K 3.3*   < > 5.0  --  5.6*  --  5.2* 4.2 5.2*  CL 99   < > 99  --  97*  --  95* 100 101  CO2 29   < > 24  --  20*  --  24 24 18*  GLUCOSE 121*   < > 371*  --  254*  --  404* 171* 270*  BUN 28*   < > 18  --  19  --  19 19 20   CREATININE 1.61*   < > 1.48*  --  1.71*  --  1.77* 1.82* 1.54*  CALCIUM 7.9*   < > 8.9   < > 9.6   < > 9.3 9.0 9.1  MG 1.5*  --   --   --   --   --   --   --   --    < > = values in this interval not displayed.    Liver Function Tests: Recent Labs  Lab 03/15/20 0300  AST 56*  ALT 26  ALKPHOS 212*  BILITOT 4.2*  PROT 6.0*  ALBUMIN 2.8*   No results for input(s): LIPASE, AMYLASE in the last 168 hours. No results for input(s): AMMONIA in the last 168 hours.  CBC: Recent Labs  Lab 03/16/20 0316 03/19/20 0425 03/20/20 0019 03/20/20 1205 03/21/20 0024  WBC 6.0 5.9 6.7 6.2 5.6  HGB 12.9 12.8 17.4* 15.4* 15.9*  HCT 39.6 39.7 54.3* 48.4* 49.9*  MCV 94.5 95.4 94.9 95.8 96.5  PLT 188 229 261 259 261    Cardiac Enzymes: No results for input(s): CKTOTAL, CKMB, CKMBINDEX, TROPONINI in the last 168 hours.  BNP: BNP (last 3 results) No results for input(s): BNP in the last 8760 hours.  ProBNP (last 3 results) No results for input(s): PROBNP in the last 8760 hours.    Other results:  Imaging: No results found.   Medications:     Scheduled Medications: . aspirin EC  81 mg Oral Daily  . atorvastatin  40 mg Oral QPM  . Chlorhexidine Gluconate Cloth  6 each Topical Daily  . clopidogrel  75 mg Oral Daily  . digoxin  0.0625 mg Oral Daily  . enoxaparin (LOVENOX) injection  30 mg Subcutaneous Q24H  . feeding supplement  237 mL Oral BID BM  . insulin aspart   0-6 Units Subcutaneous TID WC  . insulin aspart  4 Units Subcutaneous TID WC  . insulin glargine  15 Units Subcutaneous QHS  . lidocaine  1 patch Transdermal Q24H  . sodium chloride flush  10-40 mL Intracatheter Q12H    Infusions:   PRN Medications: acetaminophen **OR** acetaminophen, ondansetron **OR** ondansetron (ZOFRAN) IV, polyethylene glycol, sodium chloride flush  Plan/Discussion:    1. Acute on Chronic Systolic Heart Failure>> Cardiogenic Shock  - Echo 2016 EF 30-35%, RV moderately reduced. LHC showed mild nonobstructive CAD - Echo this admit shows EF 20-25%, RV severely reduced, severe LVH and thicken valves concerning for AL amyloid. Echo suggestive of cardiac amyloidosis but PYP is negative for TTR amyloid. 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  - cMRI not supportive of amyloid. - SPEP negative. Kappa light chains mildly elevated. Suspect non-specific. - milrinone decreased 11/7 from 0.25 to 0.125 Co-ox remained 50%. She appeared inotrope dependent but is not a candidate for home inotropes or advanced therapies.   - Milrinone stopped on 11/10.   - Held lasix 40 po daily, spiro and entresto due to elevated Cr and K+ on 11/11.  - Volume status stable, -14 L and -12 lbs since admission.  - Continue to hold lasix, spiro and entresto in setting of mildly elevated K+; tomorrow may consider adding losartan if SBP remains < 110 vs entresto, also pending potassium level.  If potassium remains elevated can consider adding bidil.  - C/w low-dose digoxin (dig level 0.8) - no ? blocker w/ shock  - Appreciate Palliative recs   2. 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 IR consulted w/ plans to perform cerebral angiogram when stable - no intracardiac source of embolus noted on echo - no afib on tele  - management per neuro  and IR - PT/OT recommending SNF   3. AKI - SCr on admit was 1.43, peaked at 1.8.  - Today Cr increased 1.54 - likely due to cardiogenic shock/ cardiorenal  - continue to monitor   4. IDDD - management per primary team  - on  SSI    5. CAD - mild nonobstructive CAD by cath in 2016, w/  10-20% LAD and 20% LCx disease, RCA was small, nondominant w/ no significant coronary plaque - medical management  - no s/s angina  Length of Stay: 10   Terance Ice NP 03/21/2020, 6:42 AM  Advanced Heart Failure Team Pager [phone] (M-F; 7a - 4p)  Please contact [facility] for night-coverage after hours (4p -7a ) and weekends on [email or web address]  Patient seen and examined with the above-signed Advanced Practice Provider and/or Housestaff. I personally reviewed laboratory data, imaging studies and relevant notes. I independently examined the patient and formulated the important aspects of the plan. I have edited the note to reflect any of my changes or salient points. I have personally discussed the plan with the patient and/or family.  Only complaint is that she doesn't like her breakfast and wants grits. Denies SOB, orthopnea or PND.   Westley Foots and lasix held yesterday due to low BP and AKI. Got some fluid back. BP and renal function better. K still high/  General:  Lying in bed No resp difficulty. Very HOH HEENT: normal x R ptosis Neck: supple. no JVD. Carotids 2+ bilat; no bruits. No lymphadenopathy or thryomegaly appreciated. Cor: PMI nondisplaced. Regular rate & rhythm. No rubs, gallops or murmurs. Lungs: clear Abdomen: soft, nontender, nondistended. No hepatosplenomegaly. No bruits or masses. Good bowel sounds. Extremities: no cyanosis, clubbing, rash, edema Neuro: alert left sided weakness  HF stable off milrinone. Would continue to hold lasix, spiro and entresto one more day. Give lokelma. Can restart Entresto tomorrow if K < 5 and SBP > 110.  If K <5 and SBP < 110 use losartan  25. Can consider restarting lasix 40 MWF as intake improves.   The HF team will see again on Monday. Please call us over weekend if needed.    Arvilla Meres, MD  9:33 AM

## 2020-03-21 NOTE — Progress Notes (Signed)
Inpatient Diabetes Program Recommendations  AACE/ADA: New Consensus Statement on Inpatient Glycemic Control (2015)  Target Ranges:  Prepandial:   less than 140 mg/dL      Peak postprandial:   less than 180 mg/dL (1-2 hours)      Critically ill patients:  140 - 180 mg/dL   Lab Results  Component Value Date   GLUCAP 237 (H) 03/21/2020   HGBA1C 7.3 (H) 03/12/2020    Review of Glycemic Control Results for REBBECA, SHEPERD (MRN [id number]) as of 03/21/2020 12:54  Ref. Range 03/20/2020 23:51 03/21/2020 03:44 03/21/2020 06:57 03/21/2020 11:21  Glucose-Capillary Latest Ref Range: 70 - 99 mg/dL 428 (H) 768 (H) 115 (H) 237 (H)    Diabetes history: Type 2 DM Outpatient Diabetes medications: Novolog 75/25 20 units QAM, 30 units QPM, Metformin 500 mg BID Current orders for Inpatient glycemic control: Novolog 0-6 units TID, Novolog 6 units TID, Lantus 15 units QHS  Inpatient Diabetes Program Recommendations:    Noted hypoglycemic event yesterday of 29 mg/dL and decrease of correction. In agreement.  Patient consuming right at 50% of meals, would be guarded on increasing meal coverage.   Thanks, Lujean Rave, MSN, RNC-OB Diabetes Coordinator [phone] (8a-5p)

## 2020-03-21 NOTE — Plan of Care (Signed)
  Problem: Education: Goal: Knowledge of disease or condition will improve Outcome: Progressing Goal: Knowledge of secondary prevention will improve Outcome: Progressing Goal: Knowledge of patient specific risk factors addressed and post discharge goals established will improve Outcome: Progressing Goal: Individualized Educational Video(s) Outcome: Progressing   

## 2020-03-22 LAB — GLUCOSE, CAPILLARY
Glucose-Capillary: 168 mg/dL — ABNORMAL HIGH (ref 70–99)
Glucose-Capillary: 299 mg/dL — ABNORMAL HIGH (ref 70–99)
Glucose-Capillary: 234 mg/dL — ABNORMAL HIGH (ref 70–99)
Glucose-Capillary: 183 mg/dL — ABNORMAL HIGH (ref 70–99)

## 2020-03-22 LAB — BASIC METABOLIC PANEL
Anion gap: 9 (ref 5–15)
BUN: 18 mg/dL (ref 6–20)
CO2: 22 mmol/L (ref 22–32)
Calcium: 9.4 mg/dL (ref 8.9–10.3)
Chloride: 104 mmol/L (ref 98–111)
Creatinine, Ser: 1.48 mg/dL — ABNORMAL HIGH (ref 0.44–1.00)
GFR, Estimated: 41 mL/min — ABNORMAL LOW (ref >60)
Glucose, Bld: 134 mg/dL — ABNORMAL HIGH (ref 70–99)
Potassium: 5.5 mmol/L — ABNORMAL HIGH (ref 3.5–5.1)
Sodium: 135 mmol/L (ref 135–145)

## 2020-03-22 MED ORDER — SODIUM ZIRCONIUM CYCLOSILICATE 5 G PO PACK
5.0000 g | PACK | Freq: Once | ORAL | Status: AC
  Administered 2020-03-22: 5 g via ORAL
  Filled 2020-03-22: qty 1

## 2020-03-22 MED ORDER — INSULIN ASPART 100 UNIT/ML ~~LOC~~ SOLN
8.0000 [IU] | Freq: Three times a day (TID) | SUBCUTANEOUS | Status: DC
  Administered 2020-03-22 – 2020-03-25 (×8): 8 [IU] via SUBCUTANEOUS

## 2020-03-22 NOTE — Progress Notes (Signed)
Subjective: HD 11 Overnight, no acute events reported.  Ms Christine Benitez was evaluated at bedside this morning. She is resting comfortably in bed and does not have any acute concerns at this time. She states that she feels okay but does feel cold. She notes that she ate some of her breakfast .  Objective:  Vital signs in last 24 hours: Vitals:   03/21/20 1932 03/21/20 2345 03/22/20 0237 03/22/20 0607  BP: 108/76 101/71 111/80   Pulse:  95 100   Resp: 17 17 20    Temp: 98 F (36.7 C) 98.1 F (36.7 C) 98 F (36.7 C)   TempSrc: Oral Oral Oral   SpO2: 92% 97% 95%   Weight:    46.1 kg  Height:       Physical Exam  Constitutional: Chronically ill appearing female; No distress.  HENT: Normocephalic and atraumatic, right eye with lid lag; moist mucous membranes Cardiovascular: Normal rate, regular rhythm, S1 and S2 present, no murmurs, rubs, gallops.  Distal pulses intact Respiratory: No respiratory distress, no accessory muscle use  Lungs are clear to auscultation bilaterally. GI: Nondistended, soft, nontender to palpation, + bowel sounds Musculoskeletal: Normal bulk and tone.  No peripheral edema noted. Skin: Warm and dry.  No rash, erythema, lesions noted.  Assessment/Plan:  Principal Problem:   Cardiogenic shock (HCC) Active Problems:   Diabetes mellitus (HCC)   Chronic cholecystitis with calculus   Cerebral thrombosis with cerebral infarction   Acute urinary retention   AKI (acute kidney injury) (HCC)   Goals of care, counseling/discussion   Palliative care by specialist   Altered mental status   DNR (do not resuscitate)   DNI (do not intubate)   Encounter for hospice care discussion   Pain Ms Christine Benitez is a 59 year old female with chronic systolic heart failure and diabetes mellitus admitted for acute metabolic encephalopathy 2/2 acute right thalamic infarct complicated by cardiogenic shock requiring inotropic support.   Acute on chronic systolic heart failure   LVEF 46; suspect secondary to chronic uncontrolled hypertensive cardiomyopathy. Patient is currently on goal-directed medical therapy as tolerated. She is a poor candidate for advanced therapies with home inotropes and overall poor prognosis.  She is currently off milrinone since 11/10. Patient's lasix, spironolactone and entresto were held in setting of hyperkalemia. Today, has persistent hyperkalemia so will continue to hold. BP is also soft so will hold off on bidil for now.  -  Continue with low dose digoxin - Holding lasix, spironolactone, entresto and bidil - Will restart Entresto or Bidil as tolerated to optimize GDMT - Appreciate cardiology and palliative care recommendations   AKI Hyperkalemia  Urinary retention, at risk of bladder injury sCr stable ~1.5 this morning. She does not appear to be hypervolemic on examination at this time. She is hyperkalemic to 5.5 today.  - Lokelma 5mg   - Will hold lasix and continue to assess volume status daily - C/w Foley for bladder decompression  - Strict I/O - F/u BMP   Acute right anterior thalamic infarct  Acute metabolic encephalopathy  Currently at baseline. No further work up needed at this time given her cardiac function. - Continue DAPT - Continue statin   Type II diabetes mellitus On Lantus 15U + Novolog 6U tid+ SSI. Continuing to have hyperglycemia with meal time  - Continue Lantus 15U daily - Increase Novolog to 8U tid, continue SSI - Continue CBG monitoring   Diet: Renal Fluids/Electrolytes: No IVF; Lokelma for hyperkalemia DVT Prophylaxis: Lovenox Code  status: DNR Family communication: Per palliative care   Prior to Admission Living Arrangement: Home Anticipated Discharge Location: SNF w/ Palliative care services  Barriers to Discharge: Medical management  Dispo: Anticipated discharge in approximately 2-3 day(s).   Eliezer Bottom, MD  [facility] PGY-2 03/22/2020, 6:22 AM Pager: [phone] After 5pm on weekdays and  1pm on weekends: On Call pager [phone]

## 2020-03-22 NOTE — Progress Notes (Signed)
Civil engineer, contracting [facility])  Hospital Liaison RN note         Notified by [facility] manager of patient/family request for [facility] Palliative services at home after discharge.         Writer left message for pt's mother to attempt to confirm interest and explain services.               [facility] Palliative team will follow up with patient after discharge.         Please call with any hospice or palliative related questions.         Thank you for the opportunity to participate in this patient's care.     Chrislyn Brooke Dare, BSN, RN [facility] Hospital Liaison (listed on AMION under Hospice/[facility])    [phone]

## 2020-03-22 NOTE — TOC Progression Note (Signed)
Transition of Care [facility]) - Progression Note    Patient Details  Name: Christine Benitez MRN: [id number] Date of Birth: 10-05-1960  Transition of Care [facility]) CM/SW Contact  Terrial Rhodes, LCSWA Phone Number: 03/22/2020, 10:32 AM  Clinical Narrative:     CSW spoke with Mack Guise with [facility] and she confirmed she will start insurance authorization for patient. CSW spoke with Chrislyn with [facility] and made referral for outpateint palliative to follow patient at SNF. Chrislyn confirmed she will reach out to patients mother [name].  Patient has SNF bed at [facility]. Insurance authorization pending.  CSW will continue to follow.  Expected Discharge Plan: Skilled Nursing Facility Barriers to Discharge: Continued Medical Work up, English as a second language teacher  Expected Discharge Plan and Services Expected Discharge Plan: Skilled Nursing Facility In-house Referral: Clinical Social Work   Post Acute Care Choice: Skilled Nursing Facility Living arrangements for the past 2 months: Single Family Home                                       Social Determinants of Health (SDOH) Interventions    Readmission Risk Interventions No flowsheet data found.

## 2020-03-23 LAB — GLUCOSE, CAPILLARY
Glucose-Capillary: 202 mg/dL — ABNORMAL HIGH (ref 70–99)
Glucose-Capillary: 188 mg/dL — ABNORMAL HIGH (ref 70–99)
Glucose-Capillary: 182 mg/dL — ABNORMAL HIGH (ref 70–99)
Glucose-Capillary: 105 mg/dL — ABNORMAL HIGH (ref 70–99)

## 2020-03-23 LAB — BASIC METABOLIC PANEL
Anion gap: 7 (ref 5–15)
BUN: 25 mg/dL — ABNORMAL HIGH (ref 6–20)
CO2: 22 mmol/L (ref 22–32)
Calcium: 9.1 mg/dL (ref 8.9–10.3)
Chloride: 103 mmol/L (ref 98–111)
Creatinine, Ser: 1.57 mg/dL — ABNORMAL HIGH (ref 0.44–1.00)
GFR, Estimated: 38 mL/min — ABNORMAL LOW (ref >60)
Glucose, Bld: 181 mg/dL — ABNORMAL HIGH (ref 70–99)
Potassium: 5.5 mmol/L — ABNORMAL HIGH (ref 3.5–5.1)
Sodium: 132 mmol/L — ABNORMAL LOW (ref 135–145)

## 2020-03-23 MED ORDER — SODIUM ZIRCONIUM CYCLOSILICATE 10 G PO PACK
10.0000 g | PACK | Freq: Every day | ORAL | Status: DC
  Administered 2020-03-23: 10 g via ORAL
  Filled 2020-03-23: qty 1

## 2020-03-23 MED ORDER — SODIUM ZIRCONIUM CYCLOSILICATE 5 G PO PACK: 5.0000 g | PACK | Freq: Every day | ORAL | Status: DC

## 2020-03-23 NOTE — Progress Notes (Addendum)
   Subjective:  Patient evaluated at bedside this AM. Patient reports feeling "okay". She was able to talk to her mother the day before yesterday. She notes that she was able to eat this morning but does not have any more grits. She endorses some back pain this morning but no abdominal pain. She notes feeling cold sometimes.   Objective:  Vital signs in last 24 hours: Vitals:   03/22/20 1533 03/22/20 1942 03/22/20 2328 03/23/20 0404  BP: 96/63 97/66 91/68  107/73  Pulse: 90  84 88  Resp: 17 16 16 17   Temp: 98.2 F (36.8 C) (!) 97.4 F (36.3 C) 97.8 F (36.6 C) 98.1 F (36.7 C)  TempSrc: Oral  Oral Oral  SpO2: 98% 100% 93% 97%  Weight:    45.5 kg  Height:       Physical Exam: General: Chronically ill-appearing. No acute distress HENT: Normocephalic, atraumatic Extremities: Cool to touch. No edema present. Neuro: Awake, alert, oriented to person, place.  Assessment/Plan: [name] is 59yo female (she/her) with chronic systolic heart failure, type 2 diabetes admitted 11/2 for acute metabolic encephalopathy 2/2 acute right thalamic infarct most likely due to poor systolic function, found to be in cardiogenic shock requiring inotropic support during stay, now managing with GDMT with palliative services.  Principal Problem:   Cardiogenic shock (HCC) Active Problems:   Diabetes mellitus (HCC)   Chronic cholecystitis with calculus   Cerebral thrombosis with cerebral infarction   Acute urinary retention   AKI (acute kidney injury) (HCC)   Goals of care, counseling/discussion   Palliative care by specialist   Altered mental status   DNR (do not resuscitate)   DNI (do not intubate)   Encounter for hospice care discussion   Pain  #Acute on chronic systolic heart failure Given her continued hyperkalemia and soft blood pressures, will continue holding lasix, spiro, entresto, and bidil. SBP this AM 93, K+ 5.5. Plan to give lokelma today and hopefully re-start these medications  tomorrow. Unfortunately, patient is not candidate for advanced therapies and has an overall poor prognosis. Appreciate assistance and recommendations from palliative services and case managers. Plan for SNF placement with palliative services early this week. If patient decompensates, family considering comfort care. - C/w digoxin - Holding lasix, spiro, entresto, bidil  #Acute kidney injury #Urinary retention #Hyperkalemia sCr 1.57<1.48. Continues to appear euvolemic. K+ 5.5 today s/p 5g lokelma yesterday. Will give more today, continuing to hold spiro, lasix. - C/w Foley for bladder decompression - Trend BMP - Hold spiro, lasix  #Acute right anterior thalamic infarct #Acute metabolic encephalopathy No change in mentation this morning. Given her current cardiac function and overall poor prognosis, no further work-up at this time.  - C/w DAPT, statin  #Type 2 diabetes mellitus Current regimen: Lantus 15, Novolog 8 TID WC, SSI. CBG's continue to be elevated, 180-250. She has required 7-10u during mealtime. Will increase Lantus and continue to monitor. - Increase Lantus 18u - C/w Novolog 8u TID WC, SSI - CBG monitoring  DIET: HH IVF: none DVT PPX: Lovenox BOWEL: MIralax CODE: DNR FAM COM: Will contact patient's mother today to give update.  Prior to Admission Living Arrangement: Home Anticipated Discharge Location: SNF Barriers to Discharge: medical management, SNF authorization approval Dispo: Anticipated discharge in approximately 1-2 day(s).   59yo, MD 03/23/2020, 6:25 AM Pager: [phone] After 5pm on weekdays and 1pm on weekends: On Call pager [phone]

## 2020-03-24 DIAGNOSIS — R570 Cardiogenic shock: Secondary | ICD-10-CM | POA: Diagnosis not present

## 2020-03-24 DIAGNOSIS — Z515 Encounter for palliative care: Secondary | ICD-10-CM

## 2020-03-24 DIAGNOSIS — I6782 Cerebral ischemia: Secondary | ICD-10-CM | POA: Diagnosis not present

## 2020-03-24 DIAGNOSIS — Z789 Other specified health status: Secondary | ICD-10-CM

## 2020-03-24 DIAGNOSIS — Z7189 Other specified counseling: Secondary | ICD-10-CM

## 2020-03-24 DIAGNOSIS — R4182 Altered mental status, unspecified: Secondary | ICD-10-CM | POA: Diagnosis not present

## 2020-03-24 DIAGNOSIS — Z66 Do not resuscitate: Secondary | ICD-10-CM

## 2020-03-24 DIAGNOSIS — N179 Acute kidney failure, unspecified: Secondary | ICD-10-CM

## 2020-03-24 DIAGNOSIS — R338 Other retention of urine: Secondary | ICD-10-CM

## 2020-03-24 DIAGNOSIS — E875 Hyperkalemia: Secondary | ICD-10-CM

## 2020-03-24 DIAGNOSIS — E43 Unspecified severe protein-calorie malnutrition: Secondary | ICD-10-CM | POA: Insufficient documentation

## 2020-03-24 LAB — BASIC METABOLIC PANEL
Anion gap: 7 (ref 5–15)
BUN: 20 mg/dL (ref 6–20)
CO2: 20 mmol/L — ABNORMAL LOW (ref 22–32)
Calcium: 9.1 mg/dL (ref 8.9–10.3)
Chloride: 105 mmol/L (ref 98–111)
Creatinine, Ser: 1.37 mg/dL — ABNORMAL HIGH (ref 0.44–1.00)
GFR, Estimated: 45 mL/min — ABNORMAL LOW (ref >60)
Glucose, Bld: 163 mg/dL — ABNORMAL HIGH (ref 70–99)
Potassium: 5.9 mmol/L — ABNORMAL HIGH (ref 3.5–5.1)
Sodium: 132 mmol/L — ABNORMAL LOW (ref 135–145)

## 2020-03-24 LAB — CBC
HCT: 44.3 % (ref 36.0–46.0)
Hemoglobin: 14 g/dL (ref 12.0–15.0)
MCH: 30.7 pg (ref 26.0–34.0)
MCHC: 31.6 g/dL (ref 30.0–36.0)
MCV: 97.1 fL (ref 80.0–100.0)
Platelets: 298 10*3/uL (ref 150–400)
RBC: 4.56 MIL/uL (ref 3.87–5.11)
RDW: 17.9 % — ABNORMAL HIGH (ref 11.5–15.5)
WBC: 5.3 10*3/uL (ref 4.0–10.5)
nRBC: 0 % (ref 0.0–0.2)

## 2020-03-24 LAB — GLUCOSE, CAPILLARY
Glucose-Capillary: 201 mg/dL — ABNORMAL HIGH (ref 70–99)
Glucose-Capillary: 167 mg/dL — ABNORMAL HIGH (ref 70–99)
Glucose-Capillary: 117 mg/dL — ABNORMAL HIGH (ref 70–99)
Glucose-Capillary: 63 mg/dL — ABNORMAL LOW (ref 70–99)
Glucose-Capillary: 87 mg/dL (ref 70–99)

## 2020-03-24 LAB — POTASSIUM: Potassium: 4.7 mmol/L (ref 3.5–5.1)

## 2020-03-24 LAB — DIGOXIN LEVEL: Digoxin Level: 0.5 ng/mL — ABNORMAL LOW (ref 1.0–2.0)

## 2020-03-24 MED ORDER — INSULIN GLARGINE 100 UNIT/ML ~~LOC~~ SOLN
18.0000 [IU] | Freq: Every day | SUBCUTANEOUS | Status: DC
  Administered 2020-03-25: 18 [IU] via SUBCUTANEOUS
  Filled 2020-03-24 (×3): qty 0.18

## 2020-03-24 MED ORDER — NEPRO/CARBSTEADY PO LIQD
237.0000 mL | Freq: Three times a day (TID) | ORAL | Status: DC
  Administered 2020-03-24 – 2020-03-25 (×3): 237 mL via ORAL

## 2020-03-24 MED ORDER — SODIUM ZIRCONIUM CYCLOSILICATE 10 G PO PACK
10.0000 g | PACK | Freq: Two times a day (BID) | ORAL | Status: DC
  Administered 2020-03-24 – 2020-03-26 (×5): 10 g via ORAL
  Filled 2020-03-24 (×5): qty 1

## 2020-03-24 MED ORDER — ISOSORB DINITRATE-HYDRALAZINE 20-37.5 MG PO TABS
0.5000 | ORAL_TABLET | Freq: Two times a day (BID) | ORAL | Status: DC
  Administered 2020-03-24 (×2): 0.5 via ORAL
  Filled 2020-03-24 (×2): qty 1

## 2020-03-24 NOTE — Progress Notes (Signed)
Physical Therapy Treatment Patient Details Name: Christine Benitez MRN: [id number] DOB: 1961-02-07 Today's Date: 03/24/2020    History of Present Illness Pt is a 59 y/o female admitted secondary to AMS and fall. 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 PMH includes CHF, HTN, DM, CAD, and pulm HTN.     PT Comments    Pt very HOH but did follow commands and significantly improve ambulation tolerance and ability. Pt unable to use RW due to poor awareness and decreased ability to problem solve despite max verbal cues. Continue to recommend SNF upon d/c. Acute PT to cont to follow.    Follow Up Recommendations  SNF;Supervision/Assistance - 24 hour     Equipment Recommendations       Recommendations for Other Services       Precautions / Restrictions Precautions Precautions: Fall Precaution Comments: HOH Restrictions Weight Bearing Restrictions: No    Mobility  Bed Mobility Overal bed mobility: Needs Assistance Bed Mobility: Supine to Sit     Supine to sit: Min assist;HOB elevated     General bed mobility comments: minA to initiate transfer, able to bring LEs off EOB, minA for trunk and to scoot to EOB  Transfers Overall transfer level: Needs assistance Equipment used: Rolling walker (2 wheeled) Transfers: Sit to/from Stand Sit to Stand: Mod assist Stand pivot transfers: Mod assist       General transfer comment: modA to power up and steady, pt hesitant and guarded  Ambulation/Gait Ambulation/Gait assistance: Mod assist;+2 safety/equipment Gait Distance (Feet): 100 Feet (x2) Assistive device: 1 person hand held assist Gait Pattern/deviations: Step-through pattern;Decreased stride length Gait velocity: dec Gait velocity interpretation: <1.8 ft/sec, indicate of risk for recurrent falls General Gait Details: attempted to use RW however pt with decreased ablility to manage, provided R HHA and posterior assist at L hip to stabilize patient. Pt with improved fluidity of gait  pattern with tactile cues   Stairs             Wheelchair Mobility    Modified Rankin (Stroke Patients Only) Modified Rankin (Stroke Patients Only) Pre-Morbid Rankin Score: No significant disability Modified Rankin: Moderately severe disability     Balance Overall balance assessment: Needs assistance Sitting-balance support: Bilateral upper extremity supported;Feet supported Sitting balance-Leahy Scale: Fair     Standing balance support: Single extremity supported Standing balance-Leahy Scale: Poor Standing balance comment: dependent on physical assist                            Cognition Arousal/Alertness: Awake/alert Behavior During Therapy: Flat affect Overall Cognitive Status: No family/caregiver present to determine baseline cognitive functioning Area of Impairment: Following commands;Problem solving;Attention                   Current Attention Level: Sustained   Following Commands: Follows one step commands inconsistently Safety/Judgement: Decreased awareness of safety;Decreased awareness of deficits Awareness: Emergent ("i have to pee") Problem Solving: Slow processing;Decreased initiation;Difficulty sequencing;Requires verbal cues;Requires tactile cues General Comments: pt very HOH but was able to follow simple commands, pt sleepy upon arrival but easily awoken      Exercises      General Comments General comments (skin integrity, edema, etc.): VSS on RA, assisted to [facility] however pt was not able to urinate despite saying she had too      Pertinent Vitals/Pain Pain Assessment: Faces Faces Pain Scale: No hurt    Home Living  Prior Function            PT Goals (current goals can now be found in the care plan section) Progress towards PT goals: Progressing toward goals    Frequency    Min 3X/week      PT Plan Current plan remains appropriate    Co-evaluation              AM-PAC PT  "6 Clicks" Mobility   Outcome Measure  Help needed turning from your back to your side while in a flat bed without using bedrails?: A Little Help needed moving from lying on your back to sitting on the side of a flat bed without using bedrails?: A Little Help needed moving to and from a bed to a chair (including a wheelchair)?: A Little Help needed standing up from a chair using your arms (e.g., wheelchair or bedside chair)?: A Little Help needed to walk in hospital room?: A Lot Help needed climbing 3-5 steps with a railing? : A Lot 6 Click Score: 16    End of Session Equipment Utilized During Treatment: Gait belt Activity Tolerance: Patient tolerated treatment well;Patient limited by fatigue Patient left: in chair;with call bell/phone within reach;with chair alarm set Nurse Communication: Mobility status PT Visit Diagnosis: Unsteadiness on feet (R26.81);Muscle weakness (generalized) (M62.81);Other symptoms and signs involving the nervous system (R29.898);Other abnormalities of gait and mobility (R26.89);Difficulty in walking, not elsewhere classified (R26.2)     Time: 7846-9629 PT Time Calculation (min) (ACUTE ONLY): 27 min  Charges:  $Gait Training: 8-22 mins $Therapeutic Activity: 8-22 mins                     Lewis Shock, PT, DPT Acute Rehabilitation Services Pager #: [phone] Office #: [phone]    Iona Hansen 03/24/2020, 2:34 PM

## 2020-03-24 NOTE — Plan of Care (Signed)
  Problem: Education: Goal: Knowledge of disease or condition will improve Outcome: Progressing Goal: Knowledge of secondary prevention will improve Outcome: Progressing Goal: Knowledge of patient specific risk factors addressed and post discharge goals established will improve Outcome: Progressing Goal: Individualized Educational Video(s) Outcome: Progressing   

## 2020-03-24 NOTE — Progress Notes (Signed)
Patient foley removed at 0900 am per MD request.  3 separate attempts to void during the day unsuccessful. No urine leakage in the linens.  Bladder scan reveals 500+ in bladder.  Text to MD with this information and request to replace foley  New foley placed with 500 cc out

## 2020-03-24 NOTE — Progress Notes (Addendum)
Advanced Heart Failure Rounding Note   Subjective:    Central access placed 11/5. Co-ox 45%. Started on milrinone which was stopped on 11/10 after IJ was pulled out, co-ox at that time 57% w/ CVP 6.   Yesterday: Held lasix, spiro and entresto due to elevated K+.   Today: Alert and oriented to self, reports only complaints pain meds.  Tolerating PO intake.   cMRI 11/7  1.LVEF 30%. EVEF 42%  2. Moderate-severe MR and severe TR 3. Non-coronary LGE pattern with extensive mid-myocardial scar in the basal to mid septal, inferior, and lateral walls. This could be consistent with prior myocarditis versus cardiac amyloidosis. Wall thickness is not particularly impressive for amyloidosis and ECV percentage is only 33% (mildly elevated, often see higher with amyloidosis).  Objective:   Weight Range:  Vital Signs:   Temp:  [98.2 F (36.8 C)-98.4 F (36.9 C)] 98.2 F (36.8 C) (11/15 0315) Pulse Rate:  [90-101] 99 (11/15 0315) Resp:  [14-19] 15 (11/15 0315) BP: (92-117)/(64-78) 113/76 (11/15 0315) SpO2:  [96 %-100 %] 100 % (11/15 0315) Weight:  [46.3 kg] 46.3 kg (11/15 0500) Last BM Date: 03/22/20  Weight change: Filed Weights   03/22/20 0607 03/23/20 0404 03/24/20 0500  Weight: 46.1 kg 45.5 kg 46.3 kg    Intake/Output:   Intake/Output Summary (Last 24 hours) at 03/24/2020 0709 Last data filed at 03/23/2020 2104 Gross per 24 hour  Intake 610 ml  Output 1450 ml  Net -840 ml     Physical Exam:  General:  Well appearing. No resp difficulty HEENT: normal, R lid lag Neck: supple. no JVD.  No lymphadenopathy or thryomegaly appreciated. Cor: PMI nondisplaced. Regular rate & rhythm. No rubs, gallops or murmurs. Lungs: clear bilaterally.  Abdomen: soft, nontender, nondistended. No hepatosplenomegaly. No bruits or masses. Good bowel sounds. Extremities: no cyanosis, clubbing, rash, edema Neuro: alert & oriented, moves all 4 extremities w/o difficulty. Affect  pleasant   Telemetry: SR with rates in the 90s, personally reviewed.   Labs: Basic Metabolic Panel: Recent Labs  Lab 03/20/20 1205 03/20/20 1205 03/21/20 0024 03/21/20 0024 03/22/20 0055 03/23/20 0031 03/24/20 0100  NA 134*  --  132*  --  135 132* 132*  K 4.2  --  5.2*  --  5.5* 5.5* 5.9*  CL 100  --  101  --  104 103 105  CO2 24  --  18*  --  22 22 20*  GLUCOSE 171*  --  270*  --  134* 181* 163*  BUN 19  --  20  --  18 25* 20  CREATININE 1.82*  --  1.54*  --  1.48* 1.57* 1.37*  CALCIUM 9.0   < > 9.1   < > 9.4 9.1 9.1   < > = values in this interval not displayed.    Liver Function Tests: No results for input(s): AST, ALT, ALKPHOS, BILITOT, PROT, ALBUMIN in the last 168 hours. No results for input(s): LIPASE, AMYLASE in the last 168 hours. No results for input(s): AMMONIA in the last 168 hours.  CBC: Recent Labs  Lab 03/19/20 0425 03/20/20 0019 03/20/20 1205 03/21/20 0024 03/24/20 0100  WBC 5.9 6.7 6.2 5.6 5.3  HGB 12.8 17.4* 15.4* 15.9* 14.0  HCT 39.7 54.3* 48.4* 49.9* 44.3  MCV 95.4 94.9 95.8 96.5 97.1  PLT 229 261 259 261 298    Cardiac Enzymes: No results for input(s): CKTOTAL, CKMB, CKMBINDEX, TROPONINI in the last 168 hours.  BNP: BNP (last  3 results) No results for input(s): BNP in the last 8760 hours.  ProBNP (last 3 results) No results for input(s): PROBNP in the last 8760 hours.    Other results:  Imaging: No results found.   Medications:     Scheduled Medications: . aspirin EC  81 mg Oral Daily  . atorvastatin  40 mg Oral QPM  . Chlorhexidine Gluconate Cloth  6 each Topical Daily  . clopidogrel  75 mg Oral Daily  . digoxin  0.0625 mg Oral Daily  . enoxaparin (LOVENOX) injection  30 mg Subcutaneous Q24H  . feeding supplement  237 mL Oral BID BM  . insulin aspart  0-6 Units Subcutaneous TID WC  . insulin aspart  8 Units Subcutaneous TID WC  . insulin glargine  18 Units Subcutaneous QHS  . lidocaine  1 patch Transdermal Q24H  .  sodium zirconium cyclosilicate  10 g Oral BID    Infusions:   PRN Medications: acetaminophen **OR** acetaminophen, ondansetron **OR** ondansetron (ZOFRAN) IV, polyethylene glycol, traMADol  Plan/Discussion:    1. Acute on Chronic Systolic Heart Failure>> Cardiogenic Shock  - Echo 2016 EF 30-35%, RV moderately reduced. LHC showed mild nonobstructive CAD - Echo this admit shows EF 20-25%, RV severely reduced, severe LVH and thicken valves concerning for AL amyloid. Echo suggestive of cardiac amyloidosis but PYP is negative for TTR amyloid.   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  - cMRI not supportive of amyloid. - SPEP negative. Kappa light chains mildly elevated. Suspect non-specific. - milrinone decreased 11/7 from 0.25 to 0.125 Co-ox remained 50%. She appeared inotrope dependent but is not a candidate for home inotropes or advanced therapies.   - Milrinone stopped on 11/10.   - Held lasix 40 po daily, spiro and entresto due to elevated Cr and K+ on 11/11.  - Volume status stable, negative volume today - Continue to hold lasix, spiro and entresto in setting of elevated K+  - Unable to add bidil d/t low normotensive BPs - C/w low-dose digoxin (dig level 0.8) - no ? blocker w/ shock  - Appreciate Palliative recs   2. 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 IR consulted w/ plans to perform cerebral angiogram when stable - no intracardiac source of embolus noted on echo - no afib on tele  - management per neuro and IR - PT/OT recommending SNF   3. AKI - SCr on admit was 1.43, peaked at 1.8.  - Today Cr increased 1.37 (resolved) - likely due to cardiogenic shock/ cardiorenal  - continue to monitor   4. IDDD - management per primary team  - on SSI    5. CAD - mild nonobstructive CAD by cath in 2016, w/  10-20% LAD and 20% LCx disease, RCA was small, nondominant w/ no significant coronary plaque - medical  management  - no s/s angina  6.  Hyperkalemia -No peak Twaves on EKG -change diet to add renal diet (no additional PO potassium in diet) -holding entresto and/or ARB, and spiro   Length of Stay: 13   Terance Ice NP 03/24/2020, 7:09 AM  Advanced Heart Failure Team Pager [phone] (M-F; 7a - 4p)  Please contact [facility] for night-coverage after hours (4p -7a ) and weekends on [email or web address]  Patient seen and examined with the above-signed Advanced Practice Provider and/or Housestaff. I personally reviewed laboratory data, imaging studies and relevant notes. I independently examined the patient  and formulated the important aspects of the plan. I have edited the note to reflect any of my changes or salient points. I have personally discussed the plan with the patient and/or family.  Feels ok this am. Denies SOB, orthopnea or PND. SBP remains 90-110. She is persistently hyperkalemic despite lokelma. Weight dropping off al diuretics.   General:  Lying in bed No resp difficulty. HOH HEENT: normal. R ptosis Neck: supple. no JVD. Carotids 2+ bilat; no bruits. No lymphadenopathy or thryomegaly appreciated. Cor: PMI nondisplaced. Regular rate & rhythm. No rubs, gallops or murmurs. Lungs: clear Abdomen: soft, nontender, nondistended. No hepatosplenomegaly. No bruits or masses. Good bowel sounds. Extremities: no cyanosis, clubbing, rash, edema Neuro: alert & oriented  moves all 4 extremities w/o difficulty. Affect pleasant  Symptomatically improved. Volume status looks good off all diuretics. Unfortunately GDMT limited due to persistent hyperkalemia and low BP.   Will stop digoxin given increased risk of toxicity with hyperkalemia. Will try Bidil 1/2 tab bid to see if we can get some HF meds on board. Consult Nutrition to look for dietary sources of potassium that can be removed.   Palliative Care seems to be only option here.   Arvilla Meres, MD  8:32 AM

## 2020-03-24 NOTE — Progress Notes (Addendum)
   Subjective:  Evaluated at bedside this AM. This morning, patient reports feeling "okay". She does not have any acute concerns at this time but continues to request [place] to look for her phone.  Discussed discharge planning to SNF.   Objective:  Vital signs in last 24 hours: Vitals:   03/23/20 1543 03/23/20 1908 03/23/20 2308 03/24/20 0315  BP: 97/64 117/78 110/73 113/76  Pulse: 90 (!) 101 99 99  Resp: 18 19 14 15   Temp: 98.4 F (36.9 C) 98.3 F (36.8 C) 98.2 F (36.8 C) 98.2 F (36.8 C)  TempSrc: Oral Oral Oral Oral  SpO2: 98% 99% 96% 100%  Weight:      Height:       Physical Exam: General: Chronically ill-appearing. No acute distress HENT: Normocephalic, atraumatic. Extremities: Warm, dry. No edema. Neuro: Awake, alert, oriented to person, place.  Assessment/Plan: Christine Benitez is 59yo female (she/her) with chronic systolic heart failure, type 2 diabetes mellitus admitted 11/2 for acute metabolic encephalopathy 2/2 acute right thalamic infarct most likely due to poor systolic function, found to be in cardiogenic shock requiring inotropic support, now management with GDMT with palliative services as she is not candidate for advanced therapies.  Principal Problem:   Cardiogenic shock (HCC) Active Problems:   Diabetes mellitus (HCC)   Chronic cholecystitis with calculus   Cerebral thrombosis with cerebral infarction   Acute urinary retention   AKI (acute kidney injury) (HCC)   Goals of care, counseling/discussion   Palliative care by specialist   Altered mental status   DNR (do not resuscitate)   DNI (do not intubate)   Encounter for hospice care discussion   Pain  #Acute on chronic systolic heart failure Patient currently stable, blood pressures remaining soft, SBP 90-110. Appears euvolemic on exam. Will continue with digoxin, holding lasix, ARB, spiro given her electrolyte abnormalities and kidney function. Will continue working with Heart Failure team to come up with  regimen for patient at SNF. - Hold lasix, spiro, entresto, and digoxin due to hyperkalemia -Trial of BiDil half tablet twice daily  #Acute kidney injury #Urinary retention #Hyperkalemia K+ 5.9 this AM s/p 10g lokelma yesterday and holding lasix, entresto, spironolactone. Will give lokelma 10g BID today and re-check tomorrow. sCr 1.57 > 1.37. Will d/c Foley today for voiding trial. - D/c Foley - Trend BMP - Lokelma 10g BID - Holding spiro, lasix  #Acute right anterior thalamic infarct #Acute metabolic encephalopathy Mentation stable. Will hold further stroke work-up given poor cardiac function and overall poor prognosis. - C/w DAPT, statin  #Type 2 diabetes mellitus Current regimen: Lantus 15u QHS, Novolog 8 TID WC, SSI. Sugars remain elevated, although improved over the last few days. Will increase Lantus to 18 tonight and titrate as needed. - Lantus 18u QHS - Novolog 8 TID WC - SSI   DIET: HH IVF: none DVT PPX: Lovenox BOWEL: Miralax CODE: DNR FAM COM: Attempted call to patient's mother yesterday. Will try again today.  Prior to Admission Living Arrangement: Home Anticipated Discharge Location: SNF w/ palliative Barriers to Discharge: medical management, SNF authorization Dispo: Anticipated discharge in approximately 2-3 day(s).   13/2, MD 03/24/2020, 6:09 AM Pager: [phone] After 5pm on weekdays and 1pm on weekends: On Call pager [phone]

## 2020-03-24 NOTE — TOC Progression Note (Signed)
Transition of Care [facility]) - Progression Note    Patient Details  Name: Christine Benitez MRN: [id number] Date of Birth: May 09, 1961  [facility] CM/SW Contact  Carmina Miller, LCSWA Phone Number: 03/24/2020, 1:00 PM  Clinical Narrative:    Pt's mother requested to check with [employer] as this is where pt used to work. CSW spoke with Okey Regal at [facility], sent referral, they can accept pt, SNF will start auth.    Expected Discharge Plan: Skilled Nursing Facility Barriers to Discharge: Continued Medical Work up, English as a second language teacher  Expected Discharge Plan and Services Expected Discharge Plan: Skilled Nursing Facility In-house Referral: Clinical Social Work   Post Acute Care Choice: Skilled Nursing Facility Living arrangements for the past 2 months: Single Family Home                                       Social Determinants of Health (SDOH) Interventions    Readmission Risk Interventions No flowsheet data found.

## 2020-03-24 NOTE — Progress Notes (Addendum)
Initial Nutrition Assessment  DOCUMENTATION CODES:   Severe malnutrition in context of chronic illness, Underweight  INTERVENTION:   - d/c Ensure Enlive BID  - Nepro Shake po TID, each supplement provides 425 kcal and 19 grams protein (lower in potassium)  - Encourage adequate PO intake  NUTRITION DIAGNOSIS:   Severe Malnutrition related to chronic illness (CHF) as evidenced by severe muscle depletion, severe fat depletion.  GOAL:   Patient will meet greater than or equal to 90% of their needs  MONITOR:   PO intake, Supplement acceptance, Labs, Weight trends, I & O's  REASON FOR ASSESSMENT:   Consult Other (Please evaluate diet for additional sources of potassium.)  ASSESSMENT:   59 year old female who presented to the ED on 11/02 with acute encephalopathy. PMH of CHF, T2DM, HTN, anemia, CAD. Pt found to have an acute anterior right thalamic infarct and AKI and developed cardiogenic shock.   RD consulted to evaluate diet for additional sources of potassium related to persistently elevated K+.  Discussed pt with RN. Pt eating at least 50% at most meals. Noted renal diet restriction added to pt's dysphagia 2 diet. Pt drinking supplements when offered. RD will change pt to [facility] as it is lower in K+ than Ensure Enlive. RN provided pt with a Nepro Shake at time of visit and pt tolerated well.  Pt's brother at bedside. Explained change to Nepro Shake to pt and pt's brother. Pt requesting grits with breakfast daily. Pt also requesting to be on a regular diet. Explained need for dysphagia 2 diet and pt expressed understanding. Pt did appear somewhat confused throughout visit and requesting "Toniann Fail" to come visit her.  Weight down 15 lbs since admission. Suspect weight loss related to negative fluid balance. Will continue to monitor trends.  Pt meets criteria for severe malnutrition based on NFPE.  Meal Completion: 40-80% x last 8 meals  Medications reviewed and include:  Ensure Enlive BID, SSI, Novolog 8 units q meals, lantus 18 units daily, lokelma BID  Labs reviewed: sodium 132, potassium 5.9, creatinine 1.37 CBG's: 105-201 x 24 hours  UOP: 1450 ml x 24 hours I/O's: -17.3 L since admit  NUTRITION - FOCUSED PHYSICAL EXAM:    Most Recent Value  Orbital Region Severe depletion  Upper Arm Region Severe depletion  Thoracic and Lumbar Region Moderate depletion  Buccal Region Moderate depletion  Temple Region Moderate depletion  Clavicle Bone Region Severe depletion  Clavicle and Acromion Bone Region Severe depletion  Scapular Bone Region Moderate depletion  Dorsal Hand Moderate depletion  Patellar Region Severe depletion  Anterior Thigh Region Severe depletion  Posterior Calf Region Severe depletion  Edema (RD Assessment) None  Hair Reviewed  Eyes Reviewed  Mouth Reviewed  Skin Reviewed  Nails Reviewed       Diet Order:   Diet Order            DIET DYS 2 Room service appropriate? Yes; Fluid consistency: Thin  Diet effective now                 EDUCATION NEEDS:   Not appropriate for education at this time  Skin:  Skin Assessment: Reviewed RN Assessment  Last BM:  03/22/20  Height:   Ht Readings from Last 1 Encounters:  03/11/20 5\' 4"  (1.626 m)    Weight:   Wt Readings from Last 1 Encounters:  03/24/20 46.3 kg    BMI:  Body mass index is 17.52 kg/m.  Estimated Nutritional Needs:   Kcal:  1500-1700  Protein:  65-80 grams  Fluid:  1.5-1.7 L    Earma Reading, MS, RD, LDN Inpatient Clinical Dietitian Please see AMiON for contact information.

## 2020-03-25 ENCOUNTER — Telehealth (HOSPITAL_COMMUNITY): Payer: Self-pay | Admitting: Pharmacy Technician

## 2020-03-25 LAB — BASIC METABOLIC PANEL
Anion gap: 6 (ref 5–15)
BUN: 20 mg/dL (ref 6–20)
CO2: 24 mmol/L (ref 22–32)
Calcium: 9.2 mg/dL (ref 8.9–10.3)
Chloride: 103 mmol/L (ref 98–111)
Creatinine, Ser: 1.3 mg/dL — ABNORMAL HIGH (ref 0.44–1.00)
GFR, Estimated: 48 mL/min — ABNORMAL LOW (ref >60)
Glucose, Bld: 210 mg/dL — ABNORMAL HIGH (ref 70–99)
Potassium: 4.9 mmol/L (ref 3.5–5.1)
Sodium: 133 mmol/L — ABNORMAL LOW (ref 135–145)

## 2020-03-25 LAB — GLUCOSE, CAPILLARY
Glucose-Capillary: 257 mg/dL — ABNORMAL HIGH (ref 70–99)
Glucose-Capillary: 131 mg/dL — ABNORMAL HIGH (ref 70–99)
Glucose-Capillary: 106 mg/dL — ABNORMAL HIGH (ref 70–99)
Glucose-Capillary: 145 mg/dL — ABNORMAL HIGH (ref 70–99)

## 2020-03-25 LAB — MAGNESIUM: Magnesium: 1.8 mg/dL (ref 1.7–2.4)

## 2020-03-25 LAB — CORTISOL-AM, BLOOD: Cortisol - AM: 6.1 ug/dL — ABNORMAL LOW (ref 6.7–22.6)

## 2020-03-25 LAB — CORTISOL-PM, BLOOD: Cortisol - PM: 14.6 ug/dL — ABNORMAL HIGH (ref <10.0)

## 2020-03-25 MED ORDER — MAGNESIUM SULFATE 2 GM/50ML IV SOLN
2.0000 g | Freq: Once | INTRAVENOUS | Status: AC
  Administered 2020-03-25: 2 g via INTRAVENOUS
  Filled 2020-03-25: qty 50

## 2020-03-25 MED ORDER — INSULIN ASPART 100 UNIT/ML ~~LOC~~ SOLN
5.0000 [IU] | Freq: Three times a day (TID) | SUBCUTANEOUS | Status: DC
  Administered 2020-03-25: 5 [IU] via SUBCUTANEOUS

## 2020-03-25 MED ORDER — FUROSEMIDE 40 MG PO TABS
40.0000 mg | ORAL_TABLET | Freq: Every day | ORAL | Status: DC
  Administered 2020-03-25 – 2020-03-26 (×2): 40 mg via ORAL
  Filled 2020-03-25 (×2): qty 1

## 2020-03-25 MED ORDER — IVABRADINE HCL 5 MG PO TABS: 2.5000 mg | ORAL_TABLET | Freq: Two times a day (BID) | ORAL | 11 refills | Status: DC

## 2020-03-25 MED ORDER — IVABRADINE HCL 5 MG PO TABS
2.5000 mg | ORAL_TABLET | Freq: Two times a day (BID) | ORAL | Status: DC
  Administered 2020-03-26: 2.5 mg via ORAL
  Filled 2020-03-25 (×2): qty 1

## 2020-03-25 MED ORDER — HYDRALAZINE HCL 25 MG PO TABS
12.5000 mg | ORAL_TABLET | Freq: Three times a day (TID) | ORAL | Status: DC
  Administered 2020-03-25 – 2020-03-26 (×3): 12.5 mg via ORAL
  Filled 2020-03-25 (×3): qty 1

## 2020-03-25 MED ORDER — MIRTAZAPINE 15 MG PO TBDP
7.5000 mg | ORAL_TABLET | Freq: Every day | ORAL | Status: DC
  Administered 2020-03-25: 7.5 mg via ORAL
  Filled 2020-03-25 (×2): qty 0.5

## 2020-03-25 NOTE — TOC Progression Note (Signed)
Transition of Care [facility]) - Progression Note    Patient Details  Name: Christine Benitez MRN: [id number] Date of Birth: 21-Sep-1960  [facility]) CM/SW Contact  Carmina Miller, LCSWA Phone Number: 03/25/2020, 3:45 PM  Clinical Narrative:    Pt going to [facility]. Auth was given. Call report to [phone], ask for [address] nurses station. CSW notified pt's mom.    Expected Discharge Plan: Skilled Nursing Facility Barriers to Discharge: Continued Medical Work up, English as a second language teacher  Expected Discharge Plan and Services Expected Discharge Plan: Skilled Nursing Facility In-house Referral: Clinical Social Work   Post Acute Care Choice: Skilled Nursing Facility Living arrangements for the past 2 months: Single Family Home                                       Social Determinants of Health (SDOH) Interventions    Readmission Risk Interventions No flowsheet data found.

## 2020-03-25 NOTE — Progress Notes (Addendum)
Advanced Heart Failure Rounding Note   Subjective:    Off spiro, Entresto and Dig due to persistent hyperkalemia. Now on scheduled Lokelma. K 4.9 today.  Bidil added yesterday. BP stable but she is complaining of headaches (present prior to start of Bidil, but worse this morning).   Volume status good. Wt stable. Scr continues to trend down, 1.57>>1.37>>1.30.  No dyspnea. Sinus tach on tele, 110s.   Objective:   Weight Range:  Vital Signs:   Temp:  [97.9 F (36.6 C)-98.5 F (36.9 C)] 98.5 F (36.9 C) (11/16 0319) Pulse Rate:  [90-93] 93 (11/16 0319) Resp:  [14-19] 19 (11/16 0319) BP: (100-123)/(67-78) 123/78 (11/16 0319) SpO2:  [95 %-99 %] 99 % (11/16 0319) Weight:  [46.9 kg] 46.9 kg (11/16 0423) Last BM Date: 03/22/20  Weight change: Filed Weights   03/23/20 0404 03/24/20 0500 03/25/20 0423  Weight: 45.5 kg 46.3 kg 46.9 kg    Intake/Output:   Intake/Output Summary (Last 24 hours) at 03/25/2020 0758 Last data filed at 03/25/2020 0417 Gross per 24 hour  Intake 720 ml  Output 1900 ml  Net -1180 ml    PHYSICAL EXAM: General:  Chronically ill appearing, looks much older than actual age, dentition in poor repair. No respiratory difficulty HEENT: normal Neck: supple. no JVD. Carotids 2+ bilat; no bruits. No lymphadenopathy or thyromegaly appreciated. Cor: PMI nondisplaced. Regular rhythm, mildly tachy rate. No rubs, gallops or murmurs. Lungs: clear Abdomen: soft, nontender, nondistended. No hepatosplenomegaly. No bruits or masses. Good bowel sounds. Extremities: no cyanosis, clubbing, rash, edema Neuro: alert & oriented x 3, cranial nerves grossly intact. moves all 4 extremities w/o difficulty. Affect pleasant.  Telemetry: sinus tach 110s personally reviewed.   Labs: Basic Metabolic Panel: Recent Labs  Lab 03/21/20 0024 03/21/20 0024 03/22/20 0055 03/22/20 0055 03/23/20 0031 03/24/20 0100 03/24/20 1741 03/25/20 0123  NA 132*  --  135  --  132* 132*   --  133*  K 5.2*   < > 5.5*  --  5.5* 5.9* 4.7 4.9  CL 101  --  104  --  103 105  --  103  CO2 18*  --  22  --  22 20*  --  24  GLUCOSE 270*  --  134*  --  181* 163*  --  210*  BUN 20  --  18  --  25* 20  --  20  CREATININE 1.54*  --  1.48*  --  1.57* 1.37*  --  1.30*  CALCIUM 9.1   < > 9.4   < > 9.1 9.1  --  9.2  MG  --   --   --   --   --   --   --  1.8   < > = values in this interval not displayed.    Liver Function Tests: No results for input(s): AST, ALT, ALKPHOS, BILITOT, PROT, ALBUMIN in the last 168 hours. No results for input(s): LIPASE, AMYLASE in the last 168 hours. No results for input(s): AMMONIA in the last 168 hours.  CBC: Recent Labs  Lab 03/19/20 0425 03/20/20 0019 03/20/20 1205 03/21/20 0024 03/24/20 0100  WBC 5.9 6.7 6.2 5.6 5.3  HGB 12.8 17.4* 15.4* 15.9* 14.0  HCT 39.7 54.3* 48.4* 49.9* 44.3  MCV 95.4 94.9 95.8 96.5 97.1  PLT 229 261 259 261 298    Cardiac Enzymes: No results for input(s): CKTOTAL, CKMB, CKMBINDEX, TROPONINI in the last 168 hours.  BNP: BNP (last 3  results) No results for input(s): BNP in the last 8760 hours.  ProBNP (last 3 results) No results for input(s): PROBNP in the last 8760 hours.    Other results:  Imaging: No results found.   Medications:     Scheduled Medications: . aspirin EC  81 mg Oral Daily  . atorvastatin  40 mg Oral QPM  . Chlorhexidine Gluconate Cloth  6 each Topical Daily  . clopidogrel  75 mg Oral Daily  . enoxaparin (LOVENOX) injection  30 mg Subcutaneous Q24H  . feeding supplement (NEPRO CARB STEADY)  237 mL Oral TID BM  . insulin aspart  0-6 Units Subcutaneous TID WC  . insulin aspart  8 Units Subcutaneous TID WC  . insulin glargine  18 Units Subcutaneous QHS  . isosorbide-hydrALAZINE  0.5 tablet Oral BID  . lidocaine  1 patch Transdermal Q24H  . sodium zirconium cyclosilicate  10 g Oral BID    Infusions:   PRN Medications: acetaminophen **OR** acetaminophen, ondansetron **OR**  ondansetron (ZOFRAN) IV, polyethylene glycol, traMADol  Plan/Discussion:    1. Acute on Chronic Systolic Heart Failure>> Cardiogenic Shock  - Echo 2016 EF 30-35%, RV moderately reduced. LHC showed mild nonobstructive CAD - Echo this admit shows EF 20-25%, RV severely reduced, severe LVH and thicken valves concerning for AL amyloid. Echo suggestive of cardiac amyloidosis but PYP is negative for TTR amyloid.   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  - cMRI not supportive of amyloid. - SPEP negative. Kappa light chains mildly elevated. Suspect non-specific. - milrinone decreased 11/7 from 0.25 to 0.125 Co-ox remained 50%. She appeared inotrope dependent but is not a candidate for home inotropes or advanced therapies.   - Milrinone stopped on 11/10.   - Volume status good. Resume PO Lasix 40 mg daily  - Off spiro, Entresto and digoxin in setting of persistently elevated K+  - Unable to tolerate Bidil due to HAs. Change to hydralazine only 12.5 mg tid  - no ? blocker w/ recent shock - consider corlanor to lower HR < 70  - may consider future addition of a SGLT2i  - Appreciate Palliative recs   2. 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 IR consulted w/ plans to perform cerebral angiogram when stable - no intracardiac source of embolus noted on echo - no afib on tele  - management per neuro and IR - PT/OT recommending SNF   3. AKI - SCr on admit was 1.43, peaked at 1.8.  - SCr improving, down to 1.30 today  - likely due to cardiogenic shock/ cardiorenal  - continue to monitor   4. IDDD - management per primary team  - on SSI    5. CAD - mild nonobstructive CAD by cath in 2016, w/  10-20% LAD and 20% LCx disease, RCA was small, nondominant w/ no significant coronary plaque - medical management  - no s/s angina  6.  Hyperkalemia - resolved w/ scheduled Lokelma, 4.9 today  - holding entresto, spiro and digoxin -  RD has changed nutritional supp from Ensure to Nepro Shake as it is lower in K+, appreciate their assistance  7. Hypomagnesemia  - Mg 1.8 - Give 2 gm MgSO4 x 1  - repeat Mg level in AM     Length of Stay: 14   Christine Simmons PA-C  03/25/2020, 7:58 AM  Advanced Heart Failure Team Pager [phone] (M-F; 7a - 4p)  Please contact  [facility] for night-coverage after hours (4p -7a ) and weekends on [email or web address]   Patient seen and examined with the above-signed Advanced Practice Provider and/or Housestaff. I personally reviewed laboratory data, imaging studies and relevant notes. I independently examined the patient and formulated the important aspects of the plan. I have edited the note to reflect any of my changes or salient points. I have personally discussed the plan with the patient and/or family.  Overall tenuous but stable. Struggling to tolerate Bidil due to worsening HA. Remains tachycardic. Denies dyspnea.  General:  Lying in bed No resp difficulty HEENT: normal  R ptosis Neck: supple. no JVD. Carotids 2+ bilat; no bruits. No lymphadenopathy or thryomegaly appreciated. Cor: PMI nondisplaced. Regular tachy  No rubs, gallops or murmurs. Lungs: clear Abdomen: soft, nontender, nondistended. No hepatosplenomegaly. No bruits or masses. Good bowel sounds. Extremities: no cyanosis, clubbing, rash, edema Neuro: alert & orientedx3, cranial nerves grossly intact. moves all 4 extremities w/o difficulty. Affect pleasant  Not tolerating Bidil very well due to HAs. Although these tend to get better over time, I am not sure she will tolerate well. Will switch to hydralazine alone. Agree with low-dose corlanor. I suspect she may need low-dose diuretic as an outpatient though weight has been stable here in setting of poor po intake. May consider lasix on Monday/Friday. Will reassess tomorrow.   Arvilla Meres, MD  7:03 PM

## 2020-03-25 NOTE — Telephone Encounter (Signed)
Patient Advocate Encounter   Received notification from [facility] that prior authorization for Corlanor is required.   PA submitted on CoverMyMeds Key [id number] Status is pending   Will continue to follow.

## 2020-03-25 NOTE — Progress Notes (Signed)
°  Speech Language Pathology Treatment: Cognitive-Linquistic  Patient Details Name: Christine Benitez MRN: [id number] DOB: 04-17-1961 Today's Date: 03/25/2020 Time: 1050-1105 SLP Time Calculation (min) (ACUTE ONLY): 15 min  Assessment / Plan / Recommendation Clinical Impression  Treatment focused on cognitive goals. Patient alert, requesting pain medication for a headache upon entrance to room explaining to SLP that she had been requesting pain meds all morning via call bell. Per Diplomatic Services operational officer, patient had not mentioned headache. RN made aware. This behavior, along with patient's account of morning activities upon questioning demonstrate ongoing deficits with short term recall of information. SLP attempted to provide moderate-max verbal cueing and redirection however patient with poor sustained attention today. HOH also contributing. SLP provided repeat questioning for orientation in which patient was oriented vaguely to place ("hospital") and independently oriented to situation today. Demonstrated borderline awareness of deficits as they related to diagnosis of CVA with moderate questioning cues. Patient requesting to rest following 15 minutes of engagement with SLP. Will benefit from ongoing treatment.     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 Pt is a poor historian and family was not present during BSE.  Decreased alertness level noted with HOH status exacerbating cognitive deficits.      SLP Plan  Continue with current plan of care       Recommendations                   Follow up Recommendations: Skilled Nursing facility SLP Visit Diagnosis: Cognitive communication deficit (W09.811) Plan: Continue with  current plan of care       GO             Ahnyla Mendel MA, CCC-SLP     Niranjan Rufener Meryl 03/25/2020, 11:26 AM

## 2020-03-25 NOTE — Telephone Encounter (Signed)
Advanced Heart Failure Patient Advocate Encounter  Prior Authorization for Corlanor has been approved.    PA# [id number] Effective dates: 03/25/20 through 03/25/21  Patients co-pay is $49.66 (30 and 90 day supply)  Was able to activate a $20 co-pay card for the patient. Would recommend filling a 90 day supply over a 30 day to save the patient money.  The billing information is as follows and would need to be given to the patient's pharmacy.   RxBin: [id number] PCN: [id number] Member ID: [id number] Group ID: [id number]   Archer Asa, CPhT

## 2020-03-25 NOTE — Progress Notes (Addendum)
Subjective:  Patient evaluated at bedside this AM. She is resting comfortably in bed. She expresses frustration with not getting help to set up her food. Mentions she does not feel hungry at this time. Endorses slight headache as well. Mentions she feels depressed in the hospital and not being able to be around family. She says she feels like staff laugh at her. Re-assured patient that we are looking after her well-being. Patient has no requests at this time.  Objective:  Vital signs in last 24 hours: Vitals:   03/24/20 1605 03/24/20 1914 03/25/20 0319 03/25/20 0423  BP: 108/74 100/67 123/78   Pulse:  90 93   Resp: 14 19 19    Temp: 98.1 F (36.7 C) 98 F (36.7 C) 98.5 F (36.9 C)   TempSrc: Oral Oral Oral   SpO2:  98% 99%   Weight:    46.9 kg  Height:       Physical Exam: General: Chronically ill-appearing. No acute distress Extremities: Warm, dry. Skin in tact. Sarcopenia present. Psych: Depressed mood.  Assessment/Plan: Ms. Kolinski is 59yo female (she/her) with chronic systolic heart failure, type 2 diabetes mellitus admitted 11/2 for acute metabolic encephalopathy 2/2 acute right thalamic infarct most likely due to poor systolic function, found to be in cardiogenic shock requiring inotropic support, now stable on GDMT awaiting insurance authorization for SNF placement.  Principal Problem:   Cardiogenic shock (HCC) Active Problems:   Diabetes mellitus (HCC)   Chronic cholecystitis with calculus   Cerebral thrombosis with cerebral infarction   Acute urinary retention   AKI (acute kidney injury) (HCC)   Goals of care, counseling/discussion   Palliative care by specialist   Altered mental status   DNR (do not resuscitate)   DNI (do not intubate)   Encounter for hospice care discussion   Pain   Protein-calorie malnutrition, severe  #Acute on chronic systolic heart failure BP stable after starting 0.5 bidil yesterday, although patient experiencing headache today. Will  transition to just hydralazine instead. Potassium normal this morning after scheduled lokelma, will re-start lasix today. Appears euvolemic on exam. Appreciate recommendations from HF team.  - Re-start lasix 40mg  qd - Start hydralazine 12.5 q8 - Holding digoxin, entresto, spironolactone  #Hyperkalemia #Hypomagnesemia K+ 4.9 < 5.9 s/p 20g lokelma yesterday. Appreciate RD recommendations for low potassium diet. Will continue scheduled lokelma as we re-start lasix today. Continue holding spironolactone. - Lokelma 10g BID - Mg 2g x1 - Trend BMP, Mg  #Acute kidney injury #Urinary retention sCr 1.34 < 1.37. Patient failed voiding trial yesterday x3. Plan to continue Foley for now and plan voiding trials as tolerated. - C/w Foley for bladder decompression - Trend RFP - Strict I&Os  #Acute right anterior thalamic infarct #Acute metabolic encephalopathy Patient awake, talking this morning, slightly improved from day prior. No further stroke work-up given current cardiac function and poor overall prognosis. - C/w ASA, Plavix - C/w statin  #Type 2 diabetes mellitus Current regimen: Lantus 18u, Novolog 8u TID WC, SSI. Last night patient had hypoglycemic episode to 63, Lantus was held. Appreciate assistance of diabetes coordinator. Plan to decrease meal time coverage and continue long-acting insulin. - C/w Lantus 18u QHS - Novolog 5u TID WC - SSI  #Depressed mood Patient endorsing depressive mood this morning, stating she feels lonely in the hospital. Patient has had long hospitalization and new infarct which has affected her mentation. Given her variable po intake, will start mirtazapine and monitor daily while inpatient. - Start mirtazapine 7.5mg  QHS  DIET: HH IVF: none DVT PPX: Lovenox BOWEL: Miralax CODE: DNR FAM COM: Talked to patient's mother, [name], yesterday afternoon to give update. She expressed gratitude that patient has bed at desired facility. Expresses understanding  that patient is currently stable. Discussed overall poor prognosis given cardiac function.  Prior to Admission Living Arrangement: Home Anticipated Discharge Location: SNF - [facility] Barriers to Discharge: insurance authorization for SNF Dispo: Anticipated discharge in approximately 1-2 day(s).   Evlyn Kanner, MD 03/25/2020, 6:21 AM Pager: [phone] After 5pm on weekdays and 1pm on weekends: On Call pager [phone]

## 2020-03-25 NOTE — Progress Notes (Signed)
Occupational Therapy Treatment Patient Details Name: Christine Benitez MRN: [id number] DOB: [birth year] Today's Date: 03/25/2020    History of present illness Pt is a 59 y/o female admitted secondary to AMS and fall. 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 PMH includes CHF, HTN, DM, CAD, and pulm HTN.    OT comments  Patient continues to make progress towards goals in skilled OT session. Patient's session encompassed seated EOB exercises and ADLs due increased agitation earlier in the AM. (Pt threw her breakfast plate into the hallway due to not having people come in time). Pt continues to require increased cues in order to follow commands, but will complete with extra time allowed. While she is oriented, pt will often state things like,"I think my clothes are in the other room" but is easily redirected. Discharge remains appropriate;will continue to follow acutely.    Follow Up Recommendations  SNF    Equipment Recommendations  Other (comment) (defer to next venue)    Recommendations for Other Services      Precautions / Restrictions Precautions Precautions: Fall Precaution Comments: HOH Restrictions Weight Bearing Restrictions: No       Mobility Bed Mobility Overal bed mobility: Needs Assistance Bed Mobility: Supine to Sit;Sit to Supine     Supine to sit: Min assist;HOB elevated Sit to supine: Min assist;HOB elevated   General bed mobility comments: minA to initiate transfer, able to bring LEs off EOB, minA for trunk and to scoot to EOB  Transfers                 General transfer comment: pt not wanting to get up    Balance Overall balance assessment: Needs assistance Sitting-balance support: Bilateral upper extremity supported;Feet supported Sitting balance-Leahy Scale: Fair Sitting balance - Comments: UE support on bed to maintain static sitting balance, with min guard for safety.                                   ADL either performed or assessed  with clinical judgement   ADL Overall ADL's : Needs assistance/impaired     Grooming: Sitting;Wash/dry face;Wash/dry hands;Oral care;Applying deodorant;Set up Grooming Details (indicate cue type and reason): able to complete with extra time                             Functional mobility during ADLs: Minimal assistance;Cueing for sequencing;Cueing for safety General ADL Comments: decreased safety awareness, cognition, coordination and strength     Vision       Perception     Praxis      Cognition Arousal/Alertness: Awake/alert Behavior During Therapy: Flat affect Overall Cognitive Status: No family/caregiver present to determine baseline cognitive functioning Area of Impairment: Following commands;Problem solving;Attention;Awareness                   Current Attention Level: Sustained   Following Commands: Follows one step commands inconsistently Safety/Judgement: Decreased awareness of safety;Decreased awareness of deficits Awareness: Emergent Problem Solving: Slow processing;Decreased initiation;Difficulty sequencing;Requires verbal cues;Requires tactile cues General Comments: remains able to follow simple commands, but stated she threw up her food in her bed because of her headache, per RN report, pt became frustrated and threw her plate into the hallway, causing it to shatter against the wall        Exercises General Exercises - Lower Extremity Straight Leg Raises: Both;10 reps;Seated;AROM  Shoulder Instructions       General Comments      Pertinent Vitals/ Pain       Pain Assessment: Faces Faces Pain Scale: Hurts a little bit Pain Location: head Pain Descriptors / Indicators: Aching Pain Intervention(s): Patient requesting pain meds-RN notified;Limited activity within patient's tolerance;Monitored during session;Repositioned  Home Living                                          Prior Functioning/Environment               Frequency  Min 2X/week        Progress Toward Goals  OT Goals(current goals can now be found in the care plan section)  Progress towards OT goals: Progressing toward goals  Acute Rehab OT Goals Patient Stated Goal: to be able to walk and go home OT Goal Formulation: With patient Time For Goal Achievement: 03/26/20  Plan Discharge plan remains appropriate    Co-evaluation                 AM-PAC OT "6 Clicks" Daily Activity     Outcome Measure   Help from another person eating meals?: None Help from another person taking care of personal grooming?: A Little Help from another person toileting, which includes using toliet, bedpan, or urinal?: A Little Help from another person bathing (including washing, rinsing, drying)?: A Little Help from another person to put on and taking off regular upper body clothing?: A Little Help from another person to put on and taking off regular lower body clothing?: A Little 6 Click Score: 19    End of Session    OT Visit Diagnosis: Unsteadiness on feet (R26.81);Other abnormalities of gait and mobility (R26.89);Muscle weakness (generalized) (M62.81);Other symptoms and signs involving the nervous system (R29.898);Other symptoms and signs involving cognitive function;Hemiplegia and hemiparesis Hemiplegia - Right/Left: Left Hemiplegia - dominant/non-dominant: Non-Dominant   Activity Tolerance Patient limited by lethargy;Patient limited by fatigue   Patient Left in bed;with call bell/phone within reach;with bed alarm set;with nursing/sitter in room   Nurse Communication Mobility status;Patient requests pain meds (Pt already provided pain medication but RN informed regardless)        Time: 1610-9604 OT Time Calculation (min): 16 min  Charges: OT General Charges $OT Visit: 1 Visit OT Treatments $Self Care/Home Management : 8-22 mins  Pollyann Glen E. Anne [place], COTA/L Acute Rehabilitation  Services [phone]   Cherlyn Cushing 03/25/2020, 1:11 PM

## 2020-03-25 NOTE — Progress Notes (Signed)
Inpatient Diabetes Program Recommendations  AACE/ADA: New Consensus Statement on Inpatient Glycemic Control (2015)  Target Ranges:  Prepandial:   less than 140 mg/dL      Peak postprandial:   less than 180 mg/dL (1-2 hours)      Critically ill patients:  140 - 180 mg/dL   Lab Results  Component Value Date   GLUCAP 257 (H) 03/25/2020   HGBA1C 7.3 (H) 03/12/2020    Review of Glycemic Control Results for Christine Benitez, Christine Benitez (MRN [id number]) as of 03/25/2020 09:06  Ref. Range 03/24/2020 10:59 03/24/2020 16:07 03/24/2020 21:04 03/24/2020 21:37 03/25/2020 06:16  Glucose-Capillary Latest Ref Range: 70 - 99 mg/dL 829 (H) Novolog 9 units 117 (H) Novolog 8 units 63 (L) 87 257 (H)   Diabetes history:Type 2 DM Outpatient Diabetes medications:Novolog 75/25 20 units QAM, 30 units QPM, Metformin 500 mg BID Current orders for Inpatient glycemic control:Novolog 0-6 units TID, Novolog 8 units TID, Lantus 18 units QHS  Inpatient Diabetes Program Recommendations:   -Decrease Novolog meal coverage to 5 units tid meal coverage if eats 50% Secure chat sent to Dr. Mayford Knife.  Thank you, Pheobe Sandiford. Corley Maffeo, RN, MSN, CDE  Diabetes Coordinator Inpatient Glycemic Control Team Team Pager [phone] (8am-5pm) 03/25/2020 9:07 AM

## 2020-03-26 ENCOUNTER — Ambulatory Visit: Payer: BC Managed Care – PPO | Admitting: Cardiology

## 2020-03-26 ENCOUNTER — Other Ambulatory Visit (HOSPITAL_COMMUNITY): Payer: Self-pay | Admitting: Internal Medicine

## 2020-03-26 DIAGNOSIS — E860 Dehydration: Secondary | ICD-10-CM | POA: Diagnosis not present

## 2020-03-26 DIAGNOSIS — I272 Pulmonary hypertension, unspecified: Secondary | ICD-10-CM | POA: Diagnosis not present

## 2020-03-26 DIAGNOSIS — G459 Transient cerebral ischemic attack, unspecified: Secondary | ICD-10-CM | POA: Diagnosis not present

## 2020-03-26 DIAGNOSIS — G9341 Metabolic encephalopathy: Secondary | ICD-10-CM | POA: Diagnosis not present

## 2020-03-26 DIAGNOSIS — Z8249 Family history of ischemic heart disease and other diseases of the circulatory system: Secondary | ICD-10-CM | POA: Diagnosis not present

## 2020-03-26 DIAGNOSIS — Z794 Long term (current) use of insulin: Secondary | ICD-10-CM | POA: Diagnosis not present

## 2020-03-26 DIAGNOSIS — Z8673 Personal history of transient ischemic attack (TIA), and cerebral infarction without residual deficits: Secondary | ICD-10-CM | POA: Diagnosis not present

## 2020-03-26 DIAGNOSIS — R531 Weakness: Secondary | ICD-10-CM | POA: Diagnosis not present

## 2020-03-26 DIAGNOSIS — H9193 Unspecified hearing loss, bilateral: Secondary | ICD-10-CM | POA: Diagnosis not present

## 2020-03-26 DIAGNOSIS — Z79899 Other long term (current) drug therapy: Secondary | ICD-10-CM | POA: Diagnosis not present

## 2020-03-26 DIAGNOSIS — E119 Type 2 diabetes mellitus without complications: Secondary | ICD-10-CM | POA: Diagnosis not present

## 2020-03-26 DIAGNOSIS — F339 Major depressive disorder, recurrent, unspecified: Secondary | ICD-10-CM | POA: Diagnosis not present

## 2020-03-26 DIAGNOSIS — G3184 Mild cognitive impairment, so stated: Secondary | ICD-10-CM | POA: Diagnosis not present

## 2020-03-26 DIAGNOSIS — R404 Transient alteration of awareness: Secondary | ICD-10-CM | POA: Diagnosis not present

## 2020-03-26 DIAGNOSIS — I5022 Chronic systolic (congestive) heart failure: Secondary | ICD-10-CM

## 2020-03-26 DIAGNOSIS — E1165 Type 2 diabetes mellitus with hyperglycemia: Secondary | ICD-10-CM | POA: Diagnosis not present

## 2020-03-26 DIAGNOSIS — I6782 Cerebral ischemia: Secondary | ICD-10-CM | POA: Diagnosis not present

## 2020-03-26 DIAGNOSIS — R488 Other symbolic dysfunctions: Secondary | ICD-10-CM | POA: Diagnosis not present

## 2020-03-26 DIAGNOSIS — I69392 Facial weakness following cerebral infarction: Secondary | ICD-10-CM | POA: Diagnosis not present

## 2020-03-26 DIAGNOSIS — R109 Unspecified abdominal pain: Secondary | ICD-10-CM | POA: Diagnosis not present

## 2020-03-26 DIAGNOSIS — I428 Other cardiomyopathies: Secondary | ICD-10-CM | POA: Diagnosis not present

## 2020-03-26 DIAGNOSIS — Z20822 Contact with and (suspected) exposure to covid-19: Secondary | ICD-10-CM | POA: Diagnosis not present

## 2020-03-26 DIAGNOSIS — R634 Abnormal weight loss: Secondary | ICD-10-CM | POA: Diagnosis not present

## 2020-03-26 DIAGNOSIS — I69952 Hemiplegia and hemiparesis following unspecified cerebrovascular disease affecting left dominant side: Secondary | ICD-10-CM | POA: Diagnosis not present

## 2020-03-26 DIAGNOSIS — R52 Pain, unspecified: Secondary | ICD-10-CM

## 2020-03-26 DIAGNOSIS — K5900 Constipation, unspecified: Secondary | ICD-10-CM | POA: Diagnosis not present

## 2020-03-26 DIAGNOSIS — R5381 Other malaise: Secondary | ICD-10-CM | POA: Diagnosis not present

## 2020-03-26 DIAGNOSIS — N179 Acute kidney failure, unspecified: Secondary | ICD-10-CM | POA: Diagnosis not present

## 2020-03-26 DIAGNOSIS — I5033 Acute on chronic diastolic (congestive) heart failure: Secondary | ICD-10-CM | POA: Diagnosis not present

## 2020-03-26 DIAGNOSIS — R54 Age-related physical debility: Secondary | ICD-10-CM | POA: Diagnosis not present

## 2020-03-26 DIAGNOSIS — J449 Chronic obstructive pulmonary disease, unspecified: Secondary | ICD-10-CM | POA: Diagnosis not present

## 2020-03-26 DIAGNOSIS — R339 Retention of urine, unspecified: Secondary | ICD-10-CM | POA: Diagnosis not present

## 2020-03-26 DIAGNOSIS — R570 Cardiogenic shock: Secondary | ICD-10-CM | POA: Diagnosis not present

## 2020-03-26 DIAGNOSIS — M2550 Pain in unspecified joint: Secondary | ICD-10-CM | POA: Diagnosis not present

## 2020-03-26 DIAGNOSIS — R55 Syncope and collapse: Secondary | ICD-10-CM | POA: Diagnosis not present

## 2020-03-26 DIAGNOSIS — Z681 Body mass index (BMI) 19 or less, adult: Secondary | ICD-10-CM | POA: Diagnosis not present

## 2020-03-26 DIAGNOSIS — R1084 Generalized abdominal pain: Secondary | ICD-10-CM | POA: Diagnosis not present

## 2020-03-26 DIAGNOSIS — Z7401 Bed confinement status: Secondary | ICD-10-CM | POA: Diagnosis not present

## 2020-03-26 DIAGNOSIS — I5020 Unspecified systolic (congestive) heart failure: Secondary | ICD-10-CM | POA: Diagnosis not present

## 2020-03-26 DIAGNOSIS — N132 Hydronephrosis with renal and ureteral calculous obstruction: Secondary | ICD-10-CM | POA: Diagnosis not present

## 2020-03-26 DIAGNOSIS — R279 Unspecified lack of coordination: Secondary | ICD-10-CM | POA: Diagnosis not present

## 2020-03-26 DIAGNOSIS — R4182 Altered mental status, unspecified: Secondary | ICD-10-CM | POA: Diagnosis not present

## 2020-03-26 DIAGNOSIS — I110 Hypertensive heart disease with heart failure: Secondary | ICD-10-CM | POA: Diagnosis not present

## 2020-03-26 DIAGNOSIS — H5462 Unqualified visual loss, left eye, normal vision right eye: Secondary | ICD-10-CM | POA: Diagnosis not present

## 2020-03-26 DIAGNOSIS — I639 Cerebral infarction, unspecified: Secondary | ICD-10-CM | POA: Diagnosis not present

## 2020-03-26 DIAGNOSIS — I429 Cardiomyopathy, unspecified: Secondary | ICD-10-CM | POA: Diagnosis not present

## 2020-03-26 DIAGNOSIS — I2510 Atherosclerotic heart disease of native coronary artery without angina pectoris: Secondary | ICD-10-CM | POA: Diagnosis not present

## 2020-03-26 DIAGNOSIS — Z7902 Long term (current) use of antithrombotics/antiplatelets: Secondary | ICD-10-CM | POA: Diagnosis not present

## 2020-03-26 DIAGNOSIS — F17210 Nicotine dependence, cigarettes, uncomplicated: Secondary | ICD-10-CM | POA: Diagnosis not present

## 2020-03-26 DIAGNOSIS — N139 Obstructive and reflux uropathy, unspecified: Secondary | ICD-10-CM | POA: Diagnosis not present

## 2020-03-26 DIAGNOSIS — Z87891 Personal history of nicotine dependence: Secondary | ICD-10-CM | POA: Diagnosis not present

## 2020-03-26 DIAGNOSIS — R112 Nausea with vomiting, unspecified: Secondary | ICD-10-CM | POA: Diagnosis not present

## 2020-03-26 DIAGNOSIS — I63412 Cerebral infarction due to embolism of left middle cerebral artery: Secondary | ICD-10-CM | POA: Diagnosis not present

## 2020-03-26 DIAGNOSIS — I509 Heart failure, unspecified: Secondary | ICD-10-CM | POA: Diagnosis not present

## 2020-03-26 DIAGNOSIS — Z7982 Long term (current) use of aspirin: Secondary | ICD-10-CM | POA: Diagnosis not present

## 2020-03-26 DIAGNOSIS — E43 Unspecified severe protein-calorie malnutrition: Secondary | ICD-10-CM | POA: Diagnosis not present

## 2020-03-26 LAB — BASIC METABOLIC PANEL
Anion gap: 11 (ref 5–15)
BUN: 17 mg/dL (ref 6–20)
CO2: 22 mmol/L (ref 22–32)
Calcium: 9.5 mg/dL (ref 8.9–10.3)
Chloride: 97 mmol/L — ABNORMAL LOW (ref 98–111)
Creatinine, Ser: 1.28 mg/dL — ABNORMAL HIGH (ref 0.44–1.00)
GFR, Estimated: 49 mL/min — ABNORMAL LOW (ref >60)
Glucose, Bld: 171 mg/dL — ABNORMAL HIGH (ref 70–99)
Potassium: 5.7 mmol/L — ABNORMAL HIGH (ref 3.5–5.1)
Sodium: 130 mmol/L — ABNORMAL LOW (ref 135–145)

## 2020-03-26 LAB — GLUCOSE, CAPILLARY
Glucose-Capillary: 140 mg/dL — ABNORMAL HIGH (ref 70–99)
Glucose-Capillary: 136 mg/dL — ABNORMAL HIGH (ref 70–99)

## 2020-03-26 LAB — MAGNESIUM: Magnesium: 2.4 mg/dL (ref 1.7–2.4)

## 2020-03-26 MED ORDER — INSULIN GLARGINE 100 UNIT/ML ~~LOC~~ SOLN
18.0000 [IU] | Freq: Every day | SUBCUTANEOUS | 11 refills | Status: DC
Start: 2020-03-26 — End: 2020-10-28

## 2020-03-26 MED ORDER — HYDRALAZINE HCL 25 MG PO TABS
12.5000 mg | ORAL_TABLET | Freq: Three times a day (TID) | ORAL | 0 refills | Status: DC
Start: 2020-03-26 — End: 2020-04-15

## 2020-03-26 MED ORDER — CLOPIDOGREL BISULFATE 75 MG PO TABS
75.0000 mg | ORAL_TABLET | Freq: Every day | ORAL | 0 refills | Status: DC
Start: 2020-03-27 — End: 2020-10-28

## 2020-03-26 MED ORDER — FUROSEMIDE 40 MG PO TABS
40.0000 mg | ORAL_TABLET | Freq: Every day | ORAL | 0 refills | Status: DC
Start: 2020-03-26 — End: 2020-04-15

## 2020-03-26 MED ORDER — IVABRADINE HCL 5 MG PO TABS
5.0000 mg | ORAL_TABLET | Freq: Two times a day (BID) | ORAL | Status: DC
  Filled 2020-03-26: qty 1

## 2020-03-26 MED ORDER — INSULIN ASPART 100 UNIT/ML ~~LOC~~ SOLN: 5.0000 [IU] | Freq: Three times a day (TID) | SUBCUTANEOUS | 11 refills | Status: DC

## 2020-03-26 MED ORDER — INSULIN ASPART 100 UNIT/ML ~~LOC~~ SOLN
0.0000 [IU] | Freq: Three times a day (TID) | SUBCUTANEOUS | 11 refills | Status: DC
Start: 2020-03-26 — End: 2020-07-18

## 2020-03-26 MED ORDER — IVABRADINE HCL 5 MG PO TABS
5.0000 mg | ORAL_TABLET | Freq: Two times a day (BID) | ORAL | 11 refills | Status: DC
Start: 2020-03-26 — End: 2020-03-26

## 2020-03-26 MED ORDER — SODIUM ZIRCONIUM CYCLOSILICATE 10 G PO PACK: 10.0000 g | PACK | Freq: Two times a day (BID) | ORAL | 0 refills | Status: DC

## 2020-03-26 MED ORDER — MIRTAZAPINE 15 MG PO TBDP
7.5000 mg | ORAL_TABLET | Freq: Every day | ORAL | 0 refills | Status: DC
Start: 2020-03-26 — End: 2021-03-04

## 2020-03-26 MED ORDER — ATORVASTATIN CALCIUM 40 MG PO TABS
40.0000 mg | ORAL_TABLET | Freq: Every evening | ORAL | 0 refills | Status: DC
Start: 2020-03-26 — End: 2021-05-05

## 2020-03-26 MED FILL — CORLANOR 5 MG TABLET: 5 | 30 days supply | Qty: 60 | Fill #0

## 2020-03-26 NOTE — Discharge Summary (Signed)
Name: Christine Benitez MRN: [id number] DOB: [birth year] 59 y.o. PCP: Ailene Ravel, MD  Date of Admission: 03/11/2020  9:44 AM Date of Discharge: 03/26/2020 Attending Physician: Miguel Aschoff, MD  Discharge Diagnosis: 1. Acute on chronic systolic heart failure, cardiogenic shock 2. Acute metabolic encephalopathy 2/2 acute right anterior thalamic stroke 3. Acute kidney injury 4. Urinary retention 5. Hyperkalemia 6. Type 2 diabetes mellitus 7. Depressed mood  Discharge Medications: Allergies as of 03/26/2020   No Known Allergies     Medication List    STOP taking these medications   carvedilol 12.5 MG tablet Commonly known as: COREG   enalapril 2.5 MG tablet Commonly known as: VASOTEC   HumaLOG Mix 75/25 (75-25) 100 UNIT/ML Susp injection Generic drug: insulin lispro protamine-lispro   insulin NPH-regular Human (70-30) 100 UNIT/ML injection Commonly known as: NovoLIN 70/30     TAKE these medications   albuterol 108 (90 Base) MCG/ACT inhaler Commonly known as: VENTOLIN HFA Inhale 2 puffs into the lungs every 6 (six) hours as needed for wheezing or shortness of breath.   aspirin 81 MG EC tablet TAKE 1 TABLET (81 MG TOTAL) BY MOUTH DAILY. What changed: See the new instructions.   atorvastatin 40 MG tablet Commonly known as: LIPITOR Take 1 tablet (40 mg total) by mouth every evening. What changed:   medication strength  how much to take   clopidogrel 75 MG tablet Commonly known as: PLAVIX Take 1 tablet (75 mg total) by mouth daily. Start taking on: March 27, 2020   furosemide 40 MG tablet Commonly known as: LASIX Take 1 tablet (40 mg total) by mouth daily. What changed: when to take this   hydrALAZINE 25 MG tablet Commonly known as: APRESOLINE Take 0.5 tablets (12.5 mg total) by mouth every 8 (eight) hours.   insulin aspart 100 UNIT/ML injection Commonly known as: novoLOG Inject 0-6 Units into the skin 3 (three) times daily with meals.    insulin aspart 100 UNIT/ML injection Commonly known as: novoLOG Inject 5 Units into the skin 3 (three) times daily with meals.   insulin glargine 100 UNIT/ML injection Commonly known as: LANTUS Inject 0.18 mLs (18 Units total) into the skin at bedtime.   ivabradine 5 MG Tabs tablet Commonly known as: Corlanor Take 1 tablet (5 mg total) by mouth 2 (two) times daily with a meal.   metFORMIN 500 MG tablet Commonly known as: GLUCOPHAGE Take 500 mg by mouth 2 (two) times daily.   mirtazapine 15 MG disintegrating tablet Commonly known as: REMERON SOL-TAB Take 0.5 tablets (7.5 mg total) by mouth at bedtime.   sodium zirconium cyclosilicate 10 g Pack packet Commonly known as: LOKELMA Take 10 g by mouth 2 (two) times daily.       Disposition and follow-up:   Ms.Christine Benitez was discharged from [facility] in Stable condition.  At the hospital follow up visit please address:  1. Acute on chronic systolic heart failure. Cardiogenic shock: Required inotropic support, not candidate for advanced therapies. Limited therapies given her hyperkalemia. Lasix 40mg  prescribed as once daily, but should be given based on clinical judgment, including electrolyte levels and volume status.  Otherwise, patient should be given ivabradine 5mg  twice daily and hydralazine 12.5 three times daily.  2. Acute right anterior thalamic stroke: Exam on discharge: Awake, oriented to person, place. Does not fully comprehend medical status and prognosis. Left eye adduction incomplete. Anisocoria present, R larger than left. Left facial droop present. Lower extremities 3/5,  upper extremities 4/5. Patient to continue dual-antiplatelet therapy, including aspirin 81mg  daily and plavix 75mg  daily.   3. Acute kidney injury: No prior records, unsure of usual baseline. Most likely pre-renal etiology given poor systolic function and poor po intake. Cr at discharge 1.28.  4. Urinary retention: Patient found to  have right hydronephrosis and distended bladder 11/4, Foley placed. On 11/16, patient failed voiding trial. Patient to be discharged with Foley for short-term use. Will need voiding trial in rehab facility.  5. 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 Will need continued monitoring. Continue Lokelma 10g twice daily.  6. Type 2 diabetes mellitus: A1c 7.3. Glycemic control difficult given patient's mental status. Discharging patient with same regimen, Lantus 18u at night with 5u meal coverage and sliding scale. Recommend continued adjustment of Lantus and metformin to decrease need for meal coverage.  7. Depressed mood: Endorsing feeling lonely, depressed after not seeing her family. Given her variable po intake, starting mirtazapine 7.5mg . Can increase to 15mg .  7.  Labs / imaging needed at time of follow-up: CBC, BMP  8.  Pending labs/ test needing follow-up: n/a  Follow-up Appointments:  Follow-up Information    [facility] Follow up in 4 week(s).   Specialty: Neurology Why: stroke clinic. office will call with appt date and time.  Contact information: [address] [place] [address] [phone]       [facility] Follow up on 04/14/2020.   Specialty: Cardiology Why: 12:00 PM at the Advanced Heart Failure Clinic.  [facility] [address] Code [phone] Contact information: [address] [id number] [place] [address] [phone]       Hamrick, Durward Fortes, MD. Schedule an appointment as soon as possible for a visit in 2 week(s).   Specialty: Family Medicine Contact information: [address] [place] [address] [phone]              Hospital Course by problem list: 1. Acute on chronic systolic heart failure. Cardiogenic shock: TTE obtained during stroke work-up  revealed EF 25-30%, severe MR, TR (full findings below). 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 Concern for cardiogenic shock given cold extremities, started on milrinone with co-ox in 40's. As milrinone was weaned down, patient self-removed R IJ CVC. Given her overall poor prognosis and current functional state, palliative was consulted. Decision per family to not re-insert CVC and manage via GDMT. Patient is not a candidate for any further advanced therapies. Optimization of medications complicated by persistent hyperkalemia. Patient also did not tolerate bidil due to headaches. Patient to be discharged with ivabradine 5mg  bid, hydralazine 12.5mg  tid, and lasix 40mg . Lasix should be given per clinical judgment, including regular electrolyte checks and volume status.   2. Acute metabolic encephalopathy 2/2 acute right anterior thalamic stroke: Patient presented altered, unable to give history. Last known normal 2 days prior to presentation. MRI w/ acute right anterior thalamic infarct. Mentation improved through hospitalization, although it does not appear she fully comprehends medical status and prognosis. Full discharge exam as listed above.   3. Acute kidney injury: Unclear baseline. Soft hydration on arrival given patient found down for unknown length of time. Most likely AKI 2/2 pre-renal given poor systolic function and poor po intake. At discharge, creatinine 1.28. Will need continued monitoring.  4. Urinary retention: Patient found to have urine in bladder. Given  inability to urinate, Foley placed. Continued to have good urine output throughout hospitalization. Voiding trial 11/16 failed x3. Patient to be discharged with Foley, to be used short-term. Voiding trial will be needed at rehab facility.   5. Hyperkalemia: Patient had persistent hyperkalemia towards the end of her hospitalization. Held potassium-sparing medications, but K+  continued to stay elevated. Patient will need lokelma twice daily and continued monitoring. Possible hyperkalemia 2/2 type IV RTA.   6. Type 2 diabetes mellitus: A1c found to be 7.3 during stroke work-up. Glycemic control difficult due to patient's variable mentation and inconsistent po intake. Sugars have been steady over the last 48 hours on current regimen. Will need to continually be monitored and insulin titrated as needed.  7. Depressed mood: After acute infarct and long hospitalization, patient endorsing depressed mood and feelings of loneliness. With her variable po intake, starting on mirtazapine 7.5mg . Can increase to 15mg .  Discharge Vitals:   BP 117/79 (BP Location: Right Arm)   Pulse (!) 103   Temp 98.2 F (36.8 C) (Axillary)   Resp 20   Ht 5\' 4"  (1.626 m)   Wt 51.4 kg   SpO2 95%   BMI 19.45 kg/m   Pertinent Labs, Studies, and Procedures:  CBC Latest Ref Rng & Units 03/24/2020 03/21/2020 03/20/2020  WBC 4.0 - 10.5 K/uL 5.3 5.6 6.2  Hemoglobin 12.0 - 15.0 g/dL 13/04/2020 15.9(H) 15.4(H)  Hematocrit 36 - 46 % 44.3 49.9(H) 48.4(H)  Platelets 150 - 400 K/uL 298 261 259   BMP Latest Ref Rng & Units 03/26/2020 03/25/2020 03/24/2020  Glucose 70 - 99 mg/dL 03/27/2020) 03/26/2020) -  BUN 6 - 20 mg/dL 17 20 -  Creatinine 591(M - 1.00 mg/dL 384(Y) 6.59) -  Sodium 135 - 145 mmol/L 130(L) 133(L) -  Potassium 3.5 - 5.1 mmol/L 5.7(H) 4.9 4.7  Chloride 98 - 111 mmol/L 97(L) 103 -  CO2 22 - 32 mmol/L 22 24 -  Calcium 8.9 - 10.3 mg/dL 9.5 9.2 -   Lipid Panel     Component Value Date/Time   CHOL 80 03/12/2020 0500   TRIG 73 03/12/2020 0500   HDL 24 (L) 03/12/2020 0500   CHOLHDL 3.3 03/12/2020 0500   VLDL 15 03/12/2020 0500   LDLCALC 41 03/12/2020 0500   A1c: 7.3  MRI brain 11/2: Acute infarction of the anterior right thalamus extending into the parasagittal right midbrain.  TTE 11/3: 1. Left ventricular ejection fraction, by estimation, is 20 to 25%. The left ventricle has severely  decreased function. There is severe left ventricular hypertrophy. Left ventricular diastolic parameters are  indeterminate. The average left ventricular global longitudinal strain is -6.7 %. The global longitudinal strain is  abnormal, with a pattern of relative apical sparing.  2. Right ventricular systolic function is moderately reduced. The right ventricular size is mildly enlarged. Moderately increased right ventricular wall thickness. There is severely elevated pulmonary artery systolic pressure. The estimated right ventricular systolic pressure is 78.7 mmHg.  3. Left atrial size was moderately dilated.  4. Right atrial size was mild to moderately dilated.  5. The mitral valve is grossly normal. Severe mitral valve regurgitation.  6. Tricuspid valve regurgitation is severe.  7. The aortic valve is grossly normal. Aortic valve regurgitation is trivial.  8. The inferior vena cava is dilated in size with <50% respiratory variability, suggesting right atrial pressure of 15 mmHg.  9. A small pericardial effusion is present. The pericardial effusion is posterior to the left ventricle.   Renal u/s  11/3: Mild right hydronephrosis. Distended bladder.  Patient unable to void during exam. Small left kidney. Right pleural effusion.  Cardiac MRI 11/8: 1.  Moderate right pleural effusion. 2. Normal LV size with mild LV hypertrophy, EF 30% with diffuse hypokinesis. 3. Mildly dilated RV without significant RVH, EF 42% (mildly decreased). 4. Moderate-severe MR and severe TR, flow sequences to quantify were not done. 5. Non-coronary LGE pattern with extensive mid-myocardial scar in the basal to mid septal, inferior, and lateral walls. This could be consistent with prior myocarditis versus cardiac amyloidosis. Wall thickness is not particularly impressive for amyloidosis and ECV percentage is only 33% (mildly elevated, often see higher with amyloidosis).  Discharge Instructions: Discharge  Instructions    Ambulatory referral to Neurology   Complete by: As directed    Follow up in stroke clinic at [facility] with Ihor [place], NP in about 4 weeks. If not available, consider Dr. Delia Heady, Dr. Jamelle Rushing, or Dr. Naomie Dean.   Stroke team signing off - will likely be a few days before d/c    Ms. Arenivas, I am so glad you are able to be discharged today! You were admitted because of a stroke. During our work-up, we found that your heart function is severely low. We had to admit you into the ICU to give you medicine to help your heart pump better. Thankfully, you are not on that medication now and we have given you a new medication regimen for your heart. We have also adjusted your diabetes medications. Please see the following notes:  -For your heart, you will take: Ivabradine 5mg  twice daily and hydralazine 12.5mg  three times daily. You will also take lasix 40mg . The doctors at the rehabilitation center will determine if you need lasix daily, depending on your electrolytes and how much fluid is in your body.  -For your diabetes, you will now be given a long-acting insulin called Lantus, 18 units at night. You will also be given 5 units of short-acting insulin with meals. There is a correction insulin ordered as well in case your sugars remain high.  -You are being discharged with a Foley catheter because your bladder was injured when you originally had a stroke. The rehabilitation facility will attempt to take the catheter out and have you try to urinate on your own.  -You have a few appointments scheduled with our heart and brain doctors. We would also like you to see your primary care doctor within the next couple of weeks.  It was a pleasure meeting you, Ms. Gosney. I wish you the best during your stay at the rehabilitation facility!  Thank you, , MD  Signed: , MD 03/26/2020, 7:00 AM   Pager: [phone]

## 2020-03-26 NOTE — Progress Notes (Signed)
Discharge to be placed today 11/17---I have called report to [facility]---patient will leave with foley inserted, discharge foley orders to be included in discharge summary going with patient to SNF, DNR paper placed with discharge summary

## 2020-03-26 NOTE — Progress Notes (Addendum)
Advanced Heart Failure Rounding Note   Subjective:    Off spiro, Entresto and Dig due to persistent hyperkalemia. Bidil added but resulted in worsening headaches.  Remains on scheduled Lokelma.    Volume status and Wt stable. Sleepy today w/o complaints.   Objective:   Weight Range:  Vital Signs:   Temp:  [97.9 F (36.6 C)-98.7 F (37.1 C)] 98.2 F (36.8 C) (11/17 0501) Pulse Rate:  [93-109] 103 (11/17 0501) Resp:  [14-20] 20 (11/16 1907) BP: (111-143)/(72-92) 117/79 (11/17 0501) SpO2:  [95 %-99 %] 95 % (11/17 0332) Weight:  [51.4 kg] 51.4 kg (11/17 0334) Last BM Date: 03/23/20  Weight change: Filed Weights   03/24/20 0500 03/25/20 0423 03/26/20 0334  Weight: 46.3 kg 46.9 kg 51.4 kg    Intake/Output:   Intake/Output Summary (Last 24 hours) at 03/26/2020 0703 Last data filed at 03/25/2020 1700 Gross per 24 hour  Intake 130 ml  Output 375 ml  Net -245 ml    PHYSICAL EXAM:  General:  Well appearing. No resp difficulty.   HEENT: normal Neck: supple. no JVD.  Cor: PMI nondisplaced. Regular rate & rhythm. No rubs, gallops or murmurs. Lungs: clear bilaterally.  Abdomen: soft, nontender, nondistended. No hepatosplenomegaly. No bruits or masses. Good bowel sounds. Foley present.  Extremities: no cyanosis, clubbing, rash, edema.  Warm extremities.  Neuro: alert & oriented, moves all 4 extremities w/o difficulty. Affect sleepy.    Telemetry: NSR on tele, rates 90s. Personally reviewed.    Labs: Basic Metabolic Panel: Recent Labs  Lab 03/22/20 0055 03/22/20 0055 03/23/20 0031 03/23/20 0031 03/24/20 0100 03/24/20 1741 03/25/20 0123 03/26/20 0057  NA 135  --  132*  --  132*  --  133* 130*  K 5.5*   < > 5.5*  --  5.9* 4.7 4.9 5.7*  CL 104  --  103  --  105  --  103 97*  CO2 22  --  22  --  20*  --  24 22  GLUCOSE 134*  --  181*  --  163*  --  210* 171*  BUN 18  --  25*  --  20  --  20 17  CREATININE 1.48*  --  1.57*  --  1.37*  --  1.30* 1.28*  CALCIUM  9.4   < > 9.1   < > 9.1  --  9.2 9.5  MG  --   --   --   --   --   --  1.8 2.4   < > = values in this interval not displayed.    Liver Function Tests: No results for input(s): AST, ALT, ALKPHOS, BILITOT, PROT, ALBUMIN in the last 168 hours. No results for input(s): LIPASE, AMYLASE in the last 168 hours. No results for input(s): AMMONIA in the last 168 hours.  CBC: Recent Labs  Lab 03/20/20 0019 03/20/20 1205 03/21/20 0024 03/24/20 0100  WBC 6.7 6.2 5.6 5.3  HGB 17.4* 15.4* 15.9* 14.0  HCT 54.3* 48.4* 49.9* 44.3  MCV 94.9 95.8 96.5 97.1  PLT 261 259 261 298    Cardiac Enzymes: No results for input(s): CKTOTAL, CKMB, CKMBINDEX, TROPONINI in the last 168 hours.  BNP: BNP (last 3 results) No results for input(s): BNP in the last 8760 hours.  ProBNP (last 3 results) No results for input(s): PROBNP in the last 8760 hours.    Other results:  Imaging: No results found.   Medications:     Scheduled Medications: .  aspirin EC  81 mg Oral Daily  . atorvastatin  40 mg Oral QPM  . Chlorhexidine Gluconate Cloth  6 each Topical Daily  . clopidogrel  75 mg Oral Daily  . enoxaparin (LOVENOX) injection  30 mg Subcutaneous Q24H  . feeding supplement (NEPRO CARB STEADY)  237 mL Oral TID BM  . furosemide  40 mg Oral Daily  . hydrALAZINE  12.5 mg Oral Q8H  . insulin aspart  0-6 Units Subcutaneous TID WC  . insulin aspart  5 Units Subcutaneous TID WC  . insulin glargine  18 Units Subcutaneous QHS  . ivabradine  2.5 mg Oral BID WC  . lidocaine  1 patch Transdermal Q24H  . mirtazapine  7.5 mg Oral QHS  . sodium zirconium cyclosilicate  10 g Oral BID    Infusions:   PRN Medications: acetaminophen **OR** acetaminophen, ondansetron **OR** ondansetron (ZOFRAN) IV, polyethylene glycol, traMADol  Plan/Discussion:    1. Acute on Chronic Systolic Heart Failure>> Cardiogenic Shock  - Echo 2016 EF 30-35%, RV moderately reduced. LHC showed mild nonobstructive CAD - Echo this admit  shows EF 20-25%, RV severely reduced, severe LVH and thicken valves concerning for AL amyloid. Echo suggestive of cardiac amyloidosis but PYP is negative for TTR amyloid.  cMRI not supportive of amyloid.  SPEP negative. Kappa light chains mildly elevated. Suspect non-specific. 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 - Milrinone decreased 11/7 from 0.25 to 0.125 Co-ox remained 50%. She appeared inotrope dependent but is not a candidate for home inotropes or advanced therapies.  Milrinone stopped on 11/10.   - Volume status good.  - Continue with PO Lasix 40 mg daily. - Off spiro, Entresto and digoxin in setting of persistently elevated K+  - Unable to tolerate Bidil due to HAs. Continue w/ hydralazine 12.5 mg tid  - C/w corlanor to lower HR < 70  - may consider future addition of a SGLT2i  - Appreciate Palliative recs   2. 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 IR consulted w/ plans to perform cerebral angiogram when stable - no intracardiac source of embolus noted on echo and no afib on tele  - PT/OT recommending SNF   3. AKI - SCr on admit was 1.43, peaked at 1.8.  - SCr improving, down to 1.28 today  - likely due to cardiogenic shock/ cardiorenal   4. IDDD - management per primary team    5. CAD - mild nonobstructive CAD by cath in 2016, w/  10-20% LAD and 20% LCx disease, RCA was small, nondominant w/ no significant coronary plaque - No s/s angina - medical management: ASA 81 mg, Plavix 75 mg, and Lipitor 40 mg   6.  Hyperkalemia - Potassium this morning 5.7, hyponatremic w/ glucose at 171; check cortisol AM 6.1 and PM 14.6 in setting of recent thalamic stroke and previously having low normotensive BP readings - Remains on scheduled lokelma 10 mg BID - holding entresto, spiro and digoxin - RD has changed nutritional supp from Ensure to Nepro Shake as it is lower in K+, appreciate their assistance  7.  Hypomagnesemia  - Mg 2.4, stable  HF Recommendations: - Follow-up appt 11/17 3:40 PM - Lasix 40 mg daily PO - Ivabradine 5 mg BID PO - Hydralazine 12. 5 mg TID PO  Length of Stay: 15   Terance Ice PA-C  03/26/2020, 7:03 AM  Advanced Heart Failure Team Pager [phone] (M-F;  7a - 4p)  Please contact [facility] for night-coverage after hours (4p -7a ) and weekends on [email or web address]  Patient seen and examined with the above-signed Advanced Practice Provider and/or Housestaff. I personally reviewed laboratory data, imaging studies and relevant notes. I independently examined the patient and formulated the important aspects of the plan. I have edited the note to reflect any of my changes or salient points. I have personally discussed the plan with the patient and/or family.  Denies dyspnea, orthopnea or PND.   General:  Weak appearing. No resp difficulty HEENT: normal R ptosis Neck: supple. no JVD. Carotids 2+ bilat; no bruits. No lymphadenopathy or thryomegaly appreciated. Cor: PMI nondisplaced. Regular rate & rhythm. No rubs, gallops or murmurs. Lungs: clear Abdomen: soft, nontender, nondistended. No hepatosplenomegaly. No bruits or masses. Good bowel sounds. Extremities: no cyanosis, clubbing, rash, edema Neuro: alert & orientedx3, cranial nerves grossly intact. moves all 4 extremities w/o difficulty. Affect pleasant  Very difficult situation. 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 Unable to tolerate spiro or ARB/ARNI due to hyperkalemia. Bidil not well tolerated due to HAs. Now on hydralazine as monotherapy. B-blockers avoided due to shock. SGLT2is deferred due to frequent UTIs and poor perineal hygiene.   As such her HF therapies are dramatically limited. At this point I think we are doing  the best we can do with hydralazine, lasix and ivabradine. Would continue current regimen and use potassium binders as need to keep K down. If improves over time we can adjust regimen as needed. Would favor hydralazine of amlopidine based on HF data in AAs though we have no data on hydralazine alone without nitrates.   From our standpoint can d/c on  Lasix 40 daily Ivabradine 5 mg bid Hydralazine 12.5 tid Lokelma (as dosed per primary team)  Arvilla Meres, MD  9:06 AM

## 2020-03-26 NOTE — [place] FL2 (Signed)
MEDICAID FL2 LEVEL OF CARE SCREENING TOOL     IDENTIFICATION  Patient Name: Christine Benitez Birthdate: 01/17/1961 Sex: female Admission Date (Current Location): 03/11/2020  [facility] and [place] Number:  [employer] and Address:  [facility], [address] [place], [place] [address]      Provider Number: [id number]  Attending Physician Name and Address:  Miguel Aschoff, MD  Relative Name and Phone Number:  [name] [phone]    Current Level of Care: Hospital Recommended Level of Care: Skilled Nursing Facility Prior Approval Number:    Date Approved/Denied:   PASRR Number: [id number]  Discharge Plan: SNF    Current Diagnoses: Patient Active Problem List   Diagnosis Date Noted  . Protein-calorie malnutrition, severe 03/24/2020  . Pain   . Altered mental status   . DNR (do not resuscitate)   . DNI (do not intubate)   . Encounter for hospice care discussion   . Goals of care, counseling/discussion   . Palliative care by specialist   . AKI (acute kidney injury) (HCC)   . Cardiogenic shock (HCC) 03/14/2020  . Acute urinary retention 03/13/2020  . Chronic cholecystitis with calculus 03/12/2020  . Cerebral thrombosis with cerebral infarction 03/12/2020  . Abnormal LFTs   . Hypertension   . NICM (nonischemic cardiomyopathy) (HCC)   . Pulmonary hypertension (HCC)   . Tobacco abuse 06/10/2014  . Diabetes mellitus (HCC) 06/10/2014  . Family history of premature CAD 06/10/2014    Orientation RESPIRATION BLADDER Height & Weight     Self, Situation, Place  Normal Incontinent, Indwelling catheter Weight: 103 lb 9.9 oz (47 kg) Height:  5\' 4"  (162.6 cm)  BEHAVIORAL SYMPTOMS/MOOD NEUROLOGICAL BOWEL NUTRITION STATUS      Continent Diet (see dc summary)  AMBULATORY STATUS COMMUNICATION OF NEEDS Skin   Limited Assist Verbally Normal                       Personal Care Assistance Level of Assistance  Bathing,  Feeding, Dressing Bathing Assistance: Limited assistance Feeding assistance: Limited assistance Dressing Assistance: Limited assistance     Functional Limitations Info  Sight, Hearing, Speech Sight Info: Adequate Hearing Info: Impaired Speech Info: Adequate    SPECIAL CARE FACTORS FREQUENCY  PT (By licensed PT), OT (By licensed OT)     PT Frequency: 5x week OT Frequency: 5x week            Contractures Contractures Info: Not present    Additional Factors Info  Code Status, Allergies, Insulin Sliding Scale Code Status Info: Full Allergies Info: NKA   Insulin Sliding Scale Info: insulin aspart (novoLOG) injection 0-15 Units every 4 hours, insulin glargine (LANTUS) injection 5 Units at bedtime,       Current Medications (03/26/2020):  This is the current hospital active medication list Current Facility-Administered Medications  Medication Dose Route Frequency Provider Last Rate Last Admin  . acetaminophen (TYLENOL) tablet 650 mg  650 mg Oral Q6H PRN Clegg, Amy D, NP   650 mg at 03/25/20 1102   Or  . acetaminophen (TYLENOL) suppository 650 mg  650 mg Rectal Q6H PRN Clegg, Amy D, NP      . aspirin EC tablet 81 mg  81 mg Oral Daily Clegg, Amy D, NP   81 mg at 03/26/20 0933  . atorvastatin (LIPITOR) tablet 40 mg  40 mg Oral QPM Clegg, Amy D, NP   40 mg at 03/24/20 1645  .  Chlorhexidine Gluconate Cloth 2 % PADS 6 each  6 each Topical Daily Clegg, Amy D, NP   6 each at 03/26/20 0933  . clopidogrel (PLAVIX) tablet 75 mg  75 mg Oral Daily Clegg, Amy D, NP   75 mg at 03/26/20 0933  . enoxaparin (LOVENOX) injection 30 mg  30 mg Subcutaneous Q24H Mosetta Anis, RPH   30 mg at 03/25/20 1219  . feeding supplement (NEPRO CARB STEADY) liquid 237 mL  237 mL Oral TID BM Tyson Alias, MD   237 mL at 03/25/20 2204  . furosemide (LASIX) tablet 40 mg  40 mg Oral Daily Robbie Lis M, PA-C   40 mg at 03/26/20 0933  . hydrALAZINE (APRESOLINE) tablet 12.5 mg  12.5 mg Oral Q8H  Simmons, Brittainy M, PA-C   12.5 mg at 03/26/20 1572  . insulin aspart (novoLOG) injection 0-6 Units  0-6 Units Subcutaneous TID WC Evlyn Kanner, MD   3 Units at 03/25/20 6203  . insulin aspart (novoLOG) injection 5 Units  5 Units Subcutaneous TID WC Evlyn Kanner, MD   5 Units at 03/25/20 1218  . insulin glargine (LANTUS) injection 18 Units  18 Units Subcutaneous QHS Evlyn Kanner, MD   18 Units at 03/25/20 2203  . ivabradine (CORLANOR) tablet 5 mg  5 mg Oral BID WC Bensimhon, Bevelyn Buckles, MD      . lidocaine (LIDODERM) 5 % 1 patch  1 patch Transdermal Q24H Terance Ice, NP   1 patch at 03/25/20 1437  . mirtazapine (REMERON SOL-TAB) disintegrating tablet 7.5 mg  7.5 mg Oral QHS Evlyn Kanner, MD   7.5 mg at 03/25/20 2203  . ondansetron (ZOFRAN) tablet 4 mg  4 mg Oral Q6H PRN Clegg, Amy D, NP   4 mg at 03/22/20 0914   Or  . ondansetron (ZOFRAN) injection 4 mg  4 mg Intravenous Q6H PRN Clegg, Amy D, NP   4 mg at 03/25/20 1724  . polyethylene glycol (MIRALAX / GLYCOLAX) packet 17 g  17 g Oral Daily PRN Clegg, Amy D, NP      . sodium zirconium cyclosilicate (LOKELMA) packet 10 g  10 g Oral BID Evlyn Kanner, MD   10 g at 03/26/20 0934  . traMADol (ULTRAM) tablet 50 mg  50 mg Oral Q6H PRN Haskel Khan, NP   50 mg at 03/25/20 1258     Discharge Medications: Please see discharge summary for a list of discharge medications.  Relevant Imaging Results:  Relevant Lab Results:   Additional Information SSN [id number]  Carmina Miller, LCSWA

## 2020-03-26 NOTE — Progress Notes (Signed)
Subjective:  Patient evaluated at bedside this morning. She states she is fine.. Endorses appetite. Informed about being discharge to SNF, shows good understanding.   Objective:  Vital signs in last 24 hours: Vitals:   03/26/20 0001 03/26/20 0332 03/26/20 0334 03/26/20 0501  BP:  111/72  117/79  Pulse: (!) 109   (!) 103  Resp:      Temp: 98.7 F (37.1 C)   98.2 F (36.8 C)  TempSrc: Oral   Axillary  SpO2: 95% 95%    Weight:   51.4 kg   Height:       Physical Exam: General: Laying in bed, no acute distress CV: Tachycardic, regular rhythm. MSK: Sarcopenia present. No pitting edema Neuro: Awake, alert, oriented to person, place.  Assessment/Plan: [name] is 59yo female (she/her) with chronic systolic heart failure, type 2 diabetes mellitus admitted 11/2 for acute metabolic encephalopathy 2/2 acute right thalamic infarct most likely 2/2 poor systolic function, found to be in cardiogenic shock requiring inotropic support, managing with GDMT, complicated by persistent hyperkalemia. Stable and ready for discharge.  Principal Problem:   Cardiogenic shock (HCC) Active Problems:   Diabetes mellitus (HCC)   Chronic cholecystitis with calculus   Cerebral thrombosis with cerebral infarction   Acute urinary retention   AKI (acute kidney injury) (HCC)   Goals of care, counseling/discussion   Palliative care by specialist   Altered mental status   DNR (do not resuscitate)   DNI (do not intubate)   Encounter for hospice care discussion   Pain   Protein-calorie malnutrition, severe  #Acute on chronic systolic heart failure Blood pressures stable off of the bidil yesterday. Continued to have some headache throughout the day, resolved this morning. Discussed with heart failure team optimal medication regimen given complication of persistent hyperkalemia. Plan to continue hydralazine 12.5mg  three times daily and start ivabradine 5mg  twice daily. Will have rehabilitation facility  continue to monitor potassium and electrolyte levels to determine daily need for lasix. - Hold digoxin, entresto, spironolactone in setting of persistent hyperkalemia - C/w hydralazine 12.5mg  TID - Start ivabradine 5mg  BID - Lasix 40mg  qd if clinically indicated per electrolytes, volume status  #Hyperkalemia K+ 5.7<4.9. Will continue lokelma BID for persistent hyperkalemia. Unclear etiology, as medications and diet have both been reviewed. 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 Will have rehabilitation facility and primary care physician continue monitoring BMP.  - C/w lokelma 10g BID  #Acute kidney injury #Urinary retention Kidney function stable, Cr 1.28 < 1.30. Yesterday patient failed voiding trial. Patient to be discharged today with Foley. Will have rehabilitation continue to monitor and have voiding trials there. - C/w Foley for bladder decompression - Voiding trials at SNF - Trend RFP - Strict I&O's  #Acute right anterior thalamic infarct #Acute metabolic encephalopathy Mentation stable. No further work-up indicated at this time. Patient to follow-up with scheduled neurology appointment. Will continue DAPT at this time. - C/w ASA 81mg , plavix 75mg  qd - C/w lipitor 40mg  qd  #Type 2 diabetes mellitus CBG's more controlled over last 24 hours under current regimen: Lantus 18u QHS, Novolog 5u TID WC, SSI. Will discharge to SNF on current regimen. Recommend SNF titrate home metformin, Lantus to decrease meal time coverage. - C/w Lantus 18u QHS, Novolog 5u TID WC, SSI  #Depressed mood Patient tolerating mirtazapine well. Will continue at discharge. Can increase to 15mg  if needed. - C/w mirtazapine 7.5mg  qd  DIET: HH IVF: n/a DVT PPX: Lovenox BOWEL: Miralax CODE: DNR FAM COM: Patient's mother  made aware via CSW that she will be discharged to [facility] today.   Prior to Admission Living Arrangement: Home Anticipated Discharge Location: SNF Barriers to Discharge: none Dispo:  Anticipated discharge in approximately 0 day(s).   Christine Kanner, MD 03/26/2020, 6:52 AM Pager: [phone] After 5pm on weekdays and 1pm on weekends: On Call pager [phone]

## 2020-03-26 NOTE — TOC Transition Note (Signed)
Transition of Care [facility]) - CM/SW Discharge Note   Patient Details  Name: Christine Benitez MRN: [id number] Date of Birth: 04/29/1961  Transition of Care [facility]) CM/SW Contact:  Carmina Miller, LCSWA Phone Number: 03/26/2020, 11:22 AM   Clinical Narrative:    Patient will DC to: [facility] Anticipated DC date: 03/26/20 Family notified: [name] Transport by: Sharin Mons   Per MD patient ready for DC to [facility] RN to call report prior to discharge [phone], 400 hall nurses station.  RN, patient, patient's family, and facility notified of DC. Discharge Summary and FL2 sent to facility. DC packet on chart. Ambulance transport requested for patient.   CSW will sign off for now as social work intervention is no longer needed. Please consult [place] again if new needs arise.       Barriers to Discharge: Continued Medical Work up, English as a second language teacher   Patient Goals and CMS Choice Patient states their goals for this hospitalization and ongoing recovery are:: Get better soon CMS Medicare.gov Compare Post Acute Care list provided to:: Patient Represenative (must comment) [name], mother) Choice offered to / list presented to : Parent  Discharge Placement                       Discharge Plan and Services In-house Referral: Clinical Social Work   Post Acute Care Choice: Skilled Nursing Facility                               Social Determinants of Health (SDOH) Interventions     Readmission Risk Interventions No flowsheet data found.

## 2020-03-26 NOTE — Progress Notes (Signed)
Physical Therapy Treatment Patient Details Name: Christine Benitez MRN: [id number] DOB: 17-Jul-1960 Today's Date: 03/26/2020    History of Present Illness Pt is a 59 y/o female admitted secondary to AMS and fall. 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 PMH includes CHF, HTN, DM, CAD, and pulm HTN.     PT Comments    Pt dozing on entry, able to rouse but increased delay in command follow during entire session. Pt with continued L sided neglect and requires cuing for looking to L for obstacles. Pt is min guard for bed mobility, min-modA for transfers and modA for steadying with walking with RW. Pt to d/c to SNF this afternoon. Pt will benefit from increased PT/OT at facility.     Follow Up Recommendations  SNF;Supervision/Assistance - 24 hour     Equipment Recommendations   (TBD)       Precautions / Restrictions Precautions Precautions: Fall Precaution Comments: HOH Restrictions Weight Bearing Restrictions: No    Mobility  Bed Mobility Overal bed mobility: Needs Assistance Bed Mobility: Supine to Sit     Supine to sit: HOB elevated;Min guard     General bed mobility comments: min guard for safety with coming to the EoB   Transfers Overall transfer level: Needs assistance Equipment used: Rolling walker (2 wheeled) Transfers: Sit to/from Stand Sit to Stand: Mod assist;Min assist         General transfer comment: modA for initial power up from bed surface and min A for power up from recliner after seated rest break, multimodal cues for hand placement  Ambulation/Gait Ambulation/Gait assistance: Mod assist;+2 safety/equipment Gait Distance (Feet): 50 Feet (1x30, 1x20) Assistive device: Rolling walker (2 wheeled) Gait Pattern/deviations: Step-through pattern;Decreased stride length;Drifts right/left Gait velocity: dec   General Gait Details: modA at hips for support and steadying, constant multimodal cues for proximity to RW and attention to L side, pt requires seated rest  break after experiencing slight knee buckling, pt states "my legs are weak"      Modified Rankin (Stroke Patients Only) Modified Rankin (Stroke Patients Only) Pre-Morbid Rankin Score: No significant disability Modified Rankin: Moderately severe disability     Balance Overall balance assessment: Needs assistance Sitting-balance support: Bilateral upper extremity supported;Feet supported Sitting balance-Leahy Scale: Fair     Standing balance support: Single extremity supported Standing balance-Leahy Scale: Poor Standing balance comment: dependent on physical assist                            Cognition Arousal/Alertness: Awake/alert Behavior During Therapy: Flat affect Overall Cognitive Status: No family/caregiver present to determine baseline cognitive functioning Area of Impairment: Following commands;Problem solving;Attention                   Current Attention Level: Sustained   Following Commands: Follows one step commands inconsistently Safety/Judgement: Decreased awareness of safety;Decreased awareness of deficits Awareness: Emergent Problem Solving: Slow processing;Decreased initiation;Difficulty sequencing;Requires verbal cues;Requires tactile cues General Comments: pt very HOH, and extremely delayed response to cuing      Exercises      General Comments General comments (skin integrity, edema, etc.): VSS on RA, set up for lunch at end of session       Pertinent Vitals/Pain Pain Assessment: No/denies pain Faces Pain Scale: No hurt           PT Goals (current goals can now be found in the care plan section) Acute Rehab PT Goals PT Goal Formulation: With patient  Time For Goal Achievement: 03/26/20 Potential to Achieve Goals: Fair Progress towards PT goals: Progressing toward goals    Frequency    Min 3X/week      PT Plan Current plan remains appropriate       AM-PAC PT "6 Clicks" Mobility   Outcome Measure  Help needed  turning from your back to your side while in a flat bed without using bedrails?: A Little Help needed moving from lying on your back to sitting on the side of a flat bed without using bedrails?: A Little Help needed moving to and from a bed to a chair (including a wheelchair)?: A Little Help needed standing up from a chair using your arms (e.g., wheelchair or bedside chair)?: A Little Help needed to walk in hospital room?: A Lot Help needed climbing 3-5 steps with a railing? : A Lot 6 Click Score: 16    End of Session Equipment Utilized During Treatment: Gait belt Activity Tolerance: Patient tolerated treatment well;Patient limited by fatigue Patient left: in chair;with call bell/phone within reach;with chair alarm set Nurse Communication: Mobility status PT Visit Diagnosis: Unsteadiness on feet (R26.81);Muscle weakness (generalized) (M62.81);Other symptoms and signs involving the nervous system (R29.898);Other abnormalities of gait and mobility (R26.89);Difficulty in walking, not elsewhere classified (R26.2)     Time: 4098-1191 PT Time Calculation (min) (ACUTE ONLY): 17 min  Charges:  $Gait Training: 8-22 mins                     Lucius Wise B. Beverely Risen PT, DPT Acute Rehabilitation Services Pager [phone] Office [phone]    Elon Alas Fleet 03/26/2020, 2:35 PM

## 2020-03-28 DIAGNOSIS — I509 Heart failure, unspecified: Secondary | ICD-10-CM | POA: Diagnosis not present

## 2020-03-28 DIAGNOSIS — I639 Cerebral infarction, unspecified: Secondary | ICD-10-CM | POA: Diagnosis not present

## 2020-03-28 DIAGNOSIS — R634 Abnormal weight loss: Secondary | ICD-10-CM | POA: Diagnosis not present

## 2020-03-28 DIAGNOSIS — E119 Type 2 diabetes mellitus without complications: Secondary | ICD-10-CM | POA: Diagnosis not present

## 2020-04-14 ENCOUNTER — Other Ambulatory Visit: Payer: Self-pay

## 2020-04-14 ENCOUNTER — Encounter (HOSPITAL_COMMUNITY): Payer: Self-pay

## 2020-04-14 ENCOUNTER — Ambulatory Visit (HOSPITAL_COMMUNITY)
Admit: 2020-04-14 | Discharge: 2020-04-14 | Disposition: A | Discharge status: Home / Self Care | Payer: BC Managed Care – PPO | Source: Ambulatory Visit | Attending: Cardiology | Admitting: Cardiology

## 2020-04-14 VITALS — BP 90/56 | HR 67 | Wt 101.8 lb

## 2020-04-14 DIAGNOSIS — Z7902 Long term (current) use of antithrombotics/antiplatelets: Secondary | ICD-10-CM | POA: Insufficient documentation

## 2020-04-14 DIAGNOSIS — I5022 Chronic systolic (congestive) heart failure: Secondary | ICD-10-CM | POA: Insufficient documentation

## 2020-04-14 DIAGNOSIS — R109 Unspecified abdominal pain: Secondary | ICD-10-CM | POA: Diagnosis not present

## 2020-04-14 DIAGNOSIS — Z794 Long term (current) use of insulin: Secondary | ICD-10-CM | POA: Insufficient documentation

## 2020-04-14 DIAGNOSIS — I428 Other cardiomyopathies: Secondary | ICD-10-CM | POA: Insufficient documentation

## 2020-04-14 DIAGNOSIS — Z7982 Long term (current) use of aspirin: Secondary | ICD-10-CM | POA: Insufficient documentation

## 2020-04-14 DIAGNOSIS — I110 Hypertensive heart disease with heart failure: Secondary | ICD-10-CM | POA: Diagnosis not present

## 2020-04-14 DIAGNOSIS — Z79899 Other long term (current) drug therapy: Secondary | ICD-10-CM | POA: Diagnosis not present

## 2020-04-14 DIAGNOSIS — H5462 Unqualified visual loss, left eye, normal vision right eye: Secondary | ICD-10-CM | POA: Insufficient documentation

## 2020-04-14 DIAGNOSIS — Z87891 Personal history of nicotine dependence: Secondary | ICD-10-CM | POA: Insufficient documentation

## 2020-04-14 DIAGNOSIS — R112 Nausea with vomiting, unspecified: Secondary | ICD-10-CM | POA: Diagnosis not present

## 2020-04-14 DIAGNOSIS — Z8673 Personal history of transient ischemic attack (TIA), and cerebral infarction without residual deficits: Secondary | ICD-10-CM | POA: Diagnosis not present

## 2020-04-14 DIAGNOSIS — I2510 Atherosclerotic heart disease of native coronary artery without angina pectoris: Secondary | ICD-10-CM | POA: Diagnosis not present

## 2020-04-14 DIAGNOSIS — Z8249 Family history of ischemic heart disease and other diseases of the circulatory system: Secondary | ICD-10-CM | POA: Diagnosis not present

## 2020-04-14 DIAGNOSIS — E119 Type 2 diabetes mellitus without complications: Secondary | ICD-10-CM | POA: Insufficient documentation

## 2020-04-14 DIAGNOSIS — R54 Age-related physical debility: Secondary | ICD-10-CM | POA: Insufficient documentation

## 2020-04-14 LAB — COMPREHENSIVE METABOLIC PANEL
ALT: 43 U/L (ref 0–44)
AST: 118 U/L — ABNORMAL HIGH (ref 15–41)
Albumin: 3.7 g/dL (ref 3.5–5.0)
Alkaline Phosphatase: 124 U/L (ref 38–126)
Anion gap: 20 — ABNORMAL HIGH (ref 5–15)
BUN: 47 mg/dL — ABNORMAL HIGH (ref 6–20)
CO2: 23 mmol/L (ref 22–32)
Calcium: 9.8 mg/dL (ref 8.9–10.3)
Chloride: 90 mmol/L — ABNORMAL LOW (ref 98–111)
Creatinine, Ser: 4.31 mg/dL — ABNORMAL HIGH (ref 0.44–1.00)
GFR, Estimated: 11 mL/min — ABNORMAL LOW (ref >60)
Glucose, Bld: 138 mg/dL — ABNORMAL HIGH (ref 70–99)
Potassium: 3.4 mmol/L — ABNORMAL LOW (ref 3.5–5.1)
Sodium: 133 mmol/L — ABNORMAL LOW (ref 135–145)
Total Bilirubin: 1.5 mg/dL — ABNORMAL HIGH (ref 0.3–1.2)
Total Protein: 8.5 g/dL — ABNORMAL HIGH (ref 6.5–8.1)

## 2020-04-14 NOTE — Progress Notes (Signed)
Advanced Heart Failure Clinic Note   Referring Physician: PCP: Leonides Sake, MD PCP-Cardiologist: No primary care provider on file.  AHFC: Dr. Haroldine Laws   HPI: 59 y/o female w/ h/o tobacco abuse, IDDM, family h/o premature ischemic heart disease (mother w/ MI in her 70s), and chronic systolic heart failure, first diagnosed in 2016 when she was admitted to [facility] for acute CHF. Echo at that time showed reduced LVEF 30-35% w/ mod LVH, mild-mod MR and mod PH w/ PA pressure 60-65 mmHg. She had diagnostic LHC performed by Dr. Martinique which showed mild nonobstructive CAD (10-20% LAD and 20% LCx disease, RCA was small, nondominant w/ no significant coronary plaque). She was placed on medical therapy for systolic heart failure w/ an ACEi + ? blocker. She was seen by general cardiology for her post hospital f/u, however was lost to f/u thereafter.   She was recently admitted 11/2 for acute CVA w/ with left-sided weakness, impaired extraocular movement, right eyelid droop. CT of Head and Neck showed distal basilar artery stenosis as well as acute thalamic stroke.  As part of stroke w/u, 2D echo was perfomed and showed no intracardiac source of embolus. EF however was lower compared to study in 2016, now 20-25% w/ moderately reduced RV systolic function, severe LVH, severe MR and severe TR. Echo was suggestive of cardiac amyloidosis but PYP was negative for TTR amyloid.  cMRI not supportive of amyloid.  SPEP negative. Kappa light chains mildly elevated. Suspect non-specific. 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  During hospitalization, she developed cardiogenic shock and required milrinone and IV lasix. She appeared inotrope dependent but was not a candidate for home inotropes or advanced therapies due to other conformities. Also nursing facility dependent. Milrinone was weaned off. Co-ox remained ~50% off milrinone.   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  Unable to tolerate spiro or ARB/ARNI due to hyperkalemia. Bidil not well tolerated due to HAs. Now on hydralazine as monotherapy. B-blockers avoided due to recent shock. SGLT2is deferred due to frequent UTIs and poor perineal hygiene.   She was discharged to SNF on Lasix 40, Ivabradine 5 mg bid, hydralazine 12.5 tid and daily Lokelama to help w/ persistent hyperkalemia.   She presents to clinic today for f/u. Here w/ aid from SNF. In wheel chair, frail appearing. 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 Based on [facility] from SNF, she has been getting prescribed meds. BP soft today 90 systolic. Complains of abdominal pain, nausea and vomiting + Poor PO intake over the weekend. Looks dry on exam. Denies dyspnea.   Review of systems complete and found to be negative unless listed in HPI.      Past Medical History:  Diagnosis Date  . Abnormal LFTs    a. 05/2014 -  ALT 52, alk phos 130.  Marland Kitchen Chronic systolic CHF (congestive heart failure) ([place])    a. Dx 05/2014 - echo at [facility] - EF 30-35%, moderate LVH, no rWMA, mild to moderate MR, moderate PH with PA pressure 60-85mHg, mild to moderate pericardial effusion. EF 30-35% by cath.  . History of blood transfusion 1986   "related to C-section"  . Hypertension   . Insulin dependent diabetes mellitus   . Iron deficiency anemia   . NICM (nonischemic cardiomyopathy) ([place]    a. LHC 06/11/14:  minor nonobstructive CAD with mild irregularities  in the LAD less than 10-20%, LCx with mild disease less than 20%, no significant disease in the RCA, EF 30-35%.  . Pericardial effusion    a. Mild-moderate pericardial effusion by echo at [facility].  . Pulmonary hypertension ([place])    a. Moderate PH by echo at [facility].  . Tobacco abuse     Current Outpatient Medications  Medication Sig Dispense Refill  . albuterol (VENTOLIN HFA) 108 (90 Base) MCG/ACT inhaler Inhale 2 puffs  into the lungs every 6 (six) hours as needed for wheezing or shortness of breath.    Marland Kitchen aspirin 81 MG EC tablet TAKE 1 TABLET (81 MG TOTAL) BY MOUTH DAILY. (Patient taking differently: Take 81 mg by mouth daily. ) 30 tablet 3  . atorvastatin (LIPITOR) 40 MG tablet Take 1 tablet (40 mg total) by mouth every evening. 90 tablet 0  . clopidogrel (PLAVIX) 75 MG tablet Take 1 tablet (75 mg total) by mouth daily. 30 tablet 0  . furosemide (LASIX) 40 MG tablet Take 1 tablet (40 mg total) by mouth daily. 90 tablet 0  . hydrALAZINE (APRESOLINE) 25 MG tablet Take 0.5 tablets (12.5 mg total) by mouth every 8 (eight) hours. 90 tablet 0  . insulin aspart (NOVOLOG) 100 UNIT/ML injection Inject 0-6 Units into the skin 3 (three) times daily with meals. 10 mL 11  . insulin aspart (NOVOLOG) 100 UNIT/ML injection Inject 5 Units into the skin 3 (three) times daily with meals. 10 mL 11  . insulin glargine (LANTUS) 100 UNIT/ML injection Inject 0.18 mLs (18 Units total) into the skin at bedtime. 10 mL 11  . ivabradine (CORLANOR) 5 MG TABS tablet Take 1 tablet (5 mg total) by mouth 2 (two) times daily with a meal. 90 tablet 11  . metFORMIN (GLUCOPHAGE) 500 MG tablet Take 500 mg by mouth 2 (two) times daily.    . mirtazapine (REMERON SOL-TAB) 15 MG disintegrating tablet Take 0.5 tablets (7.5 mg total) by mouth at bedtime. 30 tablet 0  . sodium zirconium cyclosilicate (LOKELMA) 10 g PACK packet Take 10 g by mouth 2 (two) times daily. 11 each 0   No current facility-administered medications for this encounter.    No Known Allergies    Social History   Socioeconomic History  . Marital status: Divorced    Spouse name: Not on file  . Number of children: Not on file  . Years of education: Not on file  . Highest education level: Not on file  Occupational History  . Not on file  Tobacco Use  . Smoking status: Former Smoker    Packs/day: 0.50    Years: 22.00    Pack years: 11.00    Types: Cigarettes    Quit date:  03/26/2015    Years since quitting: 5.0  . Smokeless tobacco: Never Used  Substance and Sexual Activity  . Alcohol use: No  . Drug use: No  . Sexual activity: Yes  Other Topics Concern  . Not on file  Social History Narrative  . Not on file   Social Determinants of Health   Financial Resource Strain:   . Difficulty of Paying Living Expenses: Not on file  Food Insecurity:   . Worried About Charity fundraiser in the Last Year: Not on file  . Ran Out of Food in the Last Year: Not on file  Transportation Needs:   . Lack of Transportation (Medical): Not on file  . Lack of Transportation (Non-Medical): Not on file  Physical Activity:   .  Days of Exercise per Week: Not on file  . Minutes of Exercise per Session: Not on file  Stress:   . Feeling of Stress : Not on file  Social Connections:   . Frequency of Communication with Friends and Family: Not on file  . Frequency of Social Gatherings with Friends and Family: Not on file  . Attends Religious Services: Not on file  . Active Member of Clubs or Organizations: Not on file  . Attends Archivist Meetings: Not on file  . Marital Status: Not on file  Intimate Partner Violence:   . Fear of Current or Ex-Partner: Not on file  . Emotionally Abused: Not on file  . Physically Abused: Not on file  . Sexually Abused: Not on file      Family History  Problem Relation Age of Onset  . Coronary artery disease Mother   . Diabetes Mother   . Lupus Mother   . Stroke Mother   . Lung cancer Father     Vitals:   04/14/20 1156  BP: (!) 90/56  Pulse: 67  SpO2: 97%  Weight: 46.2 kg     PHYSICAL EXAM: General:  Thin/ frail appearing female in wheelchair, looks much older than actual age. No respiratory difficulty HEENT: hard of hearing, blind in left eye Neck: supple. no JVD. Carotids 2+ bilat; no bruits. No lymphadenopathy or thyromegaly appreciated. Cor: PMI nondisplaced. Regular rate & rhythm. No rubs, gallops or  murmurs. Lungs: clear, no wheezing  Abdomen: soft, nontender, nondistended. No hepatosplenomegaly. No bruits or masses. Good bowel sounds. Extremities: thin extremities, no cyanosis, clubbing, rash, edema, legs warm  Neuro: alert & oriented x 3, cranial nerves grossly intact. moves all 4 extremities w/o difficulty. Affect pleasant.  ECG: not performed    ASSESSMENT & PLAN:  1. Chronic Systolic Heart Failure  - Echo 2016 EF 30-35%, RV moderately reduced. LHC showed mild nonobstructive CAD - Echo 11/21 EF 20-25%, RV severely reduced, severe LVH and thicken valves concerning for AL amyloid. Echo suggestive of cardiac amyloidosis but PYP was negative for TTR amyloid.  cMRI not supportive of amyloid.  SPEP negative. Kappa light chains mildly elevated. Suspect non-specific. 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 - required milrinone recent admit but not candidate for home inotropes or advanced therapies.  - GDMT limited by persistently elevated K - Off spiro, Entresto and digoxin in setting of persistently elevated K+  - Unable to tolerate Bidil due to HAs.  - On hydralazine 12.5 mg tid. Will not titrate further given soft BP   - Continue corlanor 5 mg bid  - not a good candidate for SGLT2i due to frequent UTIs and poor perineal hygiene  - denies dyspnea and appears dry on exam today, BP low,  complains of abdominal pain, n/v and poor PO intake. May be due to ? Low output - Check CMP to assess SCr/ hepatic enzymes - hold lasix x 1 day, then reduce to alternating doses 40 mg one day/ 20 mg the next day.  - SNF given instructions to monitor wt. Can increase lasix back to 40 mg daily if volume status creeps up   2. Recent CVA 11/21  - CT of head and neck showedbasilar artery stenosis as well as acute thalamic stroke. -no intracardiac source of embolusnoted on echo and no afib on tele  - placed on ASA, Plavix and statin per neuro  - at SNF for rehab    3. IDDM  - on  insulin   4. CAD - mild nonobstructive CAD by cath in 2016, w/10-20% LAD and 20% LCx disease, RCA was small, nondominant w/ no significant coronary plaque - denies CP  - continue medical management, ASA 81 mg, Plavix 75 mg, and Lipitor 40 mg  5.  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  - check BMP today     Lyda Jester, PA-C 04/14/20

## 2020-04-14 NOTE — Patient Instructions (Signed)
Hold lasix for one day then change to alternating doses of 40 mg daily and 20 mg daily  Labs today We will only contact you if something comes back abnormal or we need to make some changes. Otherwise no news is good news!  Your physician recommends that you schedule a follow-up appointment in: 6-8 weeks  in the Advanced Practitioners (PA/NP) Clinic   Note-instructions provided to facility via consult report Peggye Fothergill of [place] [phone]

## 2020-04-15 ENCOUNTER — Telehealth (HOSPITAL_COMMUNITY): Payer: Self-pay

## 2020-04-15 MED ORDER — HYDRALAZINE HCL 25 MG PO TABS
12.5000 mg | ORAL_TABLET | Freq: Three times a day (TID) | ORAL | 0 refills | Status: DC
Start: 2020-04-15 — End: 2020-10-08

## 2020-04-15 NOTE — Telephone Encounter (Signed)
-----   Message from Allayne Butcher, [place] sent at 04/14/2020  2:38 PM EST ----- Notify SNF of abnormal labs. She has an AKI. SCr elevated at 4.31. BUN 47. No hyperkalemia. Stop Lasix and encourage increase in PO intake. Also allow BP to run higher for increase renal perfusion. Place hold parameters on hydralazine. Hold for SBP <115. They will need to follow CMP. Need repeat CMP in 2 days.

## 2020-04-15 NOTE — Telephone Encounter (Signed)
Samara Snide, RN  04/15/2020 12:58 PM EST Back to Top    Called [facility], nurse Kathie Rhodes was made aware and verbalized understanding. Med list updated. Labs will be drawn at [facility] and faxed over   Samara Snide, RN  04/14/2020 3:08 PM EST     Called [facility] [phone]. Was directed to the nurse station, no answer, call ended. Will try again later

## 2020-04-16 DIAGNOSIS — G9341 Metabolic encephalopathy: Secondary | ICD-10-CM | POA: Diagnosis not present

## 2020-04-16 DIAGNOSIS — R2681 Unsteadiness on feet: Secondary | ICD-10-CM | POA: Diagnosis not present

## 2020-04-16 DIAGNOSIS — E860 Dehydration: Secondary | ICD-10-CM | POA: Diagnosis not present

## 2020-04-16 DIAGNOSIS — I429 Cardiomyopathy, unspecified: Secondary | ICD-10-CM | POA: Diagnosis not present

## 2020-04-16 DIAGNOSIS — N179 Acute kidney failure, unspecified: Secondary | ICD-10-CM | POA: Diagnosis not present

## 2020-04-16 DIAGNOSIS — G8929 Other chronic pain: Secondary | ICD-10-CM | POA: Diagnosis not present

## 2020-04-16 DIAGNOSIS — J449 Chronic obstructive pulmonary disease, unspecified: Secondary | ICD-10-CM | POA: Diagnosis not present

## 2020-04-16 DIAGNOSIS — R570 Cardiogenic shock: Secondary | ICD-10-CM | POA: Diagnosis not present

## 2020-04-16 DIAGNOSIS — N139 Obstructive and reflux uropathy, unspecified: Secondary | ICD-10-CM | POA: Diagnosis not present

## 2020-04-16 DIAGNOSIS — F17210 Nicotine dependence, cigarettes, uncomplicated: Secondary | ICD-10-CM | POA: Diagnosis not present

## 2020-04-16 DIAGNOSIS — G3184 Mild cognitive impairment, so stated: Secondary | ICD-10-CM | POA: Diagnosis not present

## 2020-04-16 DIAGNOSIS — I69952 Hemiplegia and hemiparesis following unspecified cerebrovascular disease affecting left dominant side: Secondary | ICD-10-CM | POA: Diagnosis not present

## 2020-04-16 DIAGNOSIS — E43 Unspecified severe protein-calorie malnutrition: Secondary | ICD-10-CM | POA: Diagnosis not present

## 2020-04-16 DIAGNOSIS — I272 Pulmonary hypertension, unspecified: Secondary | ICD-10-CM | POA: Diagnosis not present

## 2020-04-16 DIAGNOSIS — Z794 Long term (current) use of insulin: Secondary | ICD-10-CM | POA: Diagnosis not present

## 2020-04-16 DIAGNOSIS — R404 Transient alteration of awareness: Secondary | ICD-10-CM | POA: Diagnosis not present

## 2020-04-16 DIAGNOSIS — I110 Hypertensive heart disease with heart failure: Secondary | ICD-10-CM | POA: Diagnosis not present

## 2020-04-16 DIAGNOSIS — I69392 Facial weakness following cerebral infarction: Secondary | ICD-10-CM | POA: Diagnosis not present

## 2020-04-16 DIAGNOSIS — E119 Type 2 diabetes mellitus without complications: Secondary | ICD-10-CM | POA: Diagnosis not present

## 2020-04-16 DIAGNOSIS — Z681 Body mass index (BMI) 19 or less, adult: Secondary | ICD-10-CM | POA: Diagnosis not present

## 2020-04-16 DIAGNOSIS — R279 Unspecified lack of coordination: Secondary | ICD-10-CM | POA: Diagnosis not present

## 2020-04-16 DIAGNOSIS — Z20822 Contact with and (suspected) exposure to covid-19: Secondary | ICD-10-CM | POA: Diagnosis not present

## 2020-04-16 DIAGNOSIS — M6281 Muscle weakness (generalized): Secondary | ICD-10-CM | POA: Diagnosis not present

## 2020-04-16 DIAGNOSIS — I5033 Acute on chronic diastolic (congestive) heart failure: Secondary | ICD-10-CM | POA: Diagnosis not present

## 2020-04-16 DIAGNOSIS — I5020 Unspecified systolic (congestive) heart failure: Secondary | ICD-10-CM | POA: Diagnosis not present

## 2020-04-16 DIAGNOSIS — I509 Heart failure, unspecified: Secondary | ICD-10-CM | POA: Diagnosis not present

## 2020-04-16 DIAGNOSIS — R1084 Generalized abdominal pain: Secondary | ICD-10-CM | POA: Diagnosis not present

## 2020-04-16 DIAGNOSIS — I428 Other cardiomyopathies: Secondary | ICD-10-CM | POA: Diagnosis not present

## 2020-04-16 DIAGNOSIS — E1165 Type 2 diabetes mellitus with hyperglycemia: Secondary | ICD-10-CM | POA: Diagnosis not present

## 2020-04-16 DIAGNOSIS — I63412 Cerebral infarction due to embolism of left middle cerebral artery: Secondary | ICD-10-CM | POA: Diagnosis not present

## 2020-04-16 DIAGNOSIS — K5900 Constipation, unspecified: Secondary | ICD-10-CM | POA: Diagnosis not present

## 2020-04-16 DIAGNOSIS — R488 Other symbolic dysfunctions: Secondary | ICD-10-CM | POA: Diagnosis not present

## 2020-04-16 DIAGNOSIS — F339 Major depressive disorder, recurrent, unspecified: Secondary | ICD-10-CM | POA: Diagnosis not present

## 2020-04-16 DIAGNOSIS — R339 Retention of urine, unspecified: Secondary | ICD-10-CM | POA: Diagnosis not present

## 2020-04-16 DIAGNOSIS — N132 Hydronephrosis with renal and ureteral calculous obstruction: Secondary | ICD-10-CM | POA: Diagnosis not present

## 2020-04-16 DIAGNOSIS — R112 Nausea with vomiting, unspecified: Secondary | ICD-10-CM | POA: Diagnosis not present

## 2020-04-16 DIAGNOSIS — I651 Occlusion and stenosis of basilar artery: Secondary | ICD-10-CM | POA: Diagnosis not present

## 2020-04-16 DIAGNOSIS — R55 Syncope and collapse: Secondary | ICD-10-CM | POA: Diagnosis not present

## 2020-04-16 DIAGNOSIS — R262 Difficulty in walking, not elsewhere classified: Secondary | ICD-10-CM | POA: Diagnosis not present

## 2020-04-16 DIAGNOSIS — R5381 Other malaise: Secondary | ICD-10-CM | POA: Diagnosis not present

## 2020-04-22 DIAGNOSIS — I509 Heart failure, unspecified: Secondary | ICD-10-CM | POA: Diagnosis not present

## 2020-04-22 DIAGNOSIS — I10 Essential (primary) hypertension: Secondary | ICD-10-CM | POA: Diagnosis not present

## 2020-04-22 DIAGNOSIS — R488 Other symbolic dysfunctions: Secondary | ICD-10-CM | POA: Diagnosis not present

## 2020-04-22 DIAGNOSIS — I69952 Hemiplegia and hemiparesis following unspecified cerebrovascular disease affecting left dominant side: Secondary | ICD-10-CM | POA: Diagnosis not present

## 2020-04-22 DIAGNOSIS — E1165 Type 2 diabetes mellitus with hyperglycemia: Secondary | ICD-10-CM | POA: Diagnosis not present

## 2020-04-22 DIAGNOSIS — I429 Cardiomyopathy, unspecified: Secondary | ICD-10-CM | POA: Diagnosis not present

## 2020-04-22 DIAGNOSIS — N139 Obstructive and reflux uropathy, unspecified: Secondary | ICD-10-CM | POA: Diagnosis not present

## 2020-04-22 DIAGNOSIS — R570 Cardiogenic shock: Secondary | ICD-10-CM | POA: Diagnosis not present

## 2020-04-22 DIAGNOSIS — F339 Major depressive disorder, recurrent, unspecified: Secondary | ICD-10-CM | POA: Diagnosis not present

## 2020-04-22 DIAGNOSIS — E43 Unspecified severe protein-calorie malnutrition: Secondary | ICD-10-CM | POA: Diagnosis not present

## 2020-04-22 DIAGNOSIS — G3184 Mild cognitive impairment, so stated: Secondary | ICD-10-CM | POA: Diagnosis not present

## 2020-04-22 DIAGNOSIS — I110 Hypertensive heart disease with heart failure: Secondary | ICD-10-CM | POA: Diagnosis not present

## 2020-04-22 DIAGNOSIS — I651 Occlusion and stenosis of basilar artery: Secondary | ICD-10-CM | POA: Diagnosis not present

## 2020-04-22 DIAGNOSIS — M6281 Muscle weakness (generalized): Secondary | ICD-10-CM | POA: Diagnosis not present

## 2020-04-22 DIAGNOSIS — R2681 Unsteadiness on feet: Secondary | ICD-10-CM | POA: Diagnosis not present

## 2020-04-22 DIAGNOSIS — J449 Chronic obstructive pulmonary disease, unspecified: Secondary | ICD-10-CM | POA: Diagnosis not present

## 2020-04-22 DIAGNOSIS — G9341 Metabolic encephalopathy: Secondary | ICD-10-CM | POA: Diagnosis not present

## 2020-04-22 DIAGNOSIS — K5900 Constipation, unspecified: Secondary | ICD-10-CM | POA: Diagnosis not present

## 2020-04-22 DIAGNOSIS — R262 Difficulty in walking, not elsewhere classified: Secondary | ICD-10-CM | POA: Diagnosis not present

## 2020-04-22 DIAGNOSIS — N132 Hydronephrosis with renal and ureteral calculous obstruction: Secondary | ICD-10-CM | POA: Diagnosis not present

## 2020-04-22 DIAGNOSIS — I5020 Unspecified systolic (congestive) heart failure: Secondary | ICD-10-CM | POA: Diagnosis not present

## 2020-04-22 DIAGNOSIS — I63412 Cerebral infarction due to embolism of left middle cerebral artery: Secondary | ICD-10-CM | POA: Diagnosis not present

## 2020-04-22 DIAGNOSIS — I639 Cerebral infarction, unspecified: Secondary | ICD-10-CM | POA: Diagnosis not present

## 2020-04-22 DIAGNOSIS — E785 Hyperlipidemia, unspecified: Secondary | ICD-10-CM | POA: Diagnosis not present

## 2020-04-22 DIAGNOSIS — E119 Type 2 diabetes mellitus without complications: Secondary | ICD-10-CM | POA: Diagnosis not present

## 2020-04-22 DIAGNOSIS — R279 Unspecified lack of coordination: Secondary | ICD-10-CM | POA: Diagnosis not present

## 2020-04-22 DIAGNOSIS — G8929 Other chronic pain: Secondary | ICD-10-CM | POA: Diagnosis not present

## 2020-04-28 ENCOUNTER — Encounter: Payer: Self-pay | Admitting: Adult Health

## 2020-04-28 ENCOUNTER — Other Ambulatory Visit: Payer: Self-pay

## 2020-04-28 ENCOUNTER — Ambulatory Visit (INDEPENDENT_AMBULATORY_CARE_PROVIDER_SITE_OTHER): Payer: BC Managed Care – PPO | Admitting: Adult Health

## 2020-04-28 VITALS — BP 102/69 | HR 83

## 2020-04-28 DIAGNOSIS — I639 Cerebral infarction, unspecified: Secondary | ICD-10-CM | POA: Diagnosis not present

## 2020-04-28 DIAGNOSIS — I651 Occlusion and stenosis of basilar artery: Secondary | ICD-10-CM

## 2020-04-28 DIAGNOSIS — I10 Essential (primary) hypertension: Secondary | ICD-10-CM

## 2020-04-28 DIAGNOSIS — E785 Hyperlipidemia, unspecified: Secondary | ICD-10-CM | POA: Diagnosis not present

## 2020-04-28 DIAGNOSIS — E1165 Type 2 diabetes mellitus with hyperglycemia: Secondary | ICD-10-CM

## 2020-04-28 DIAGNOSIS — Z794 Long term (current) use of insulin: Secondary | ICD-10-CM

## 2020-04-28 DIAGNOSIS — E119 Type 2 diabetes mellitus without complications: Secondary | ICD-10-CM | POA: Diagnosis not present

## 2020-04-28 DIAGNOSIS — I509 Heart failure, unspecified: Secondary | ICD-10-CM | POA: Diagnosis not present

## 2020-04-28 NOTE — Progress Notes (Signed)
[facility] [address] [place]. [place] [address] [phone]       HOSPITAL FOLLOW UP NOTE  Ms. SENAIDA CHILCOTE Date of Birth:  Nov 27, 1960 Medical Record Number:  [id number]   Reason for Referral:  hospital stroke follow up    SUBJECTIVE:   CHIEF COMPLAINT:  Chief Complaint  Patient presents with  . Follow-up    Rm 9, with with aide from [facility], pt states she is doing well, reports no pain or numbness, difficulty hearing, in wheelchair     HPI:   Ms. PAVNEET MARKWOOD is a 59 y.o. female with history of of CHF, HTN, IDDM, iron deficiency anemia, NICM, CAD, pulmonary hypertension and smoking found down, presenting with lethargy and L sided weakness .  Personally reviewed hospitalization pertinent progress notes, lab work and imaging with summary provided.  Initially evaluated by Dr. Roda Shutters with stroke work-up revealing anterior right thalamic infarct and midbrain infarcts, secondary to small vs large vessel disease source although cardioembolic source cannot be completely excluded in setting of low EF.  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  Also recommended 30-day cardiac event monitor to rule out A. fib.  Placed on DAPT for secondary stroke prevention.  HTN stable.  LDL 41 and increase atorvastatin from 20 mg to 40 mg daily.  Uncontrolled DM with A1c 7.3.  Other stroke risk factors include prior strokes on imaging, former tobacco use, family history of stroke, CAD on medical management.  Residual deficits of cognitive impairment, mild to moderate dysarthria, left facial droop, and left hemiparesis.  Evaluated by therapies and discharged to SNF for ongoing therapy needs.  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 large vessel disease source.  Cardioembolic source cannot be completely excluded due to low EF.  CT head R thalamic  hypodensity. Old L parietal cortial and subcortical infarct.   MRI  Anterior R thalamic into parasagittal R midbrain infarct   CTA head & neck Limited eval d/t bolus timing - severe distal BA stenosis, B PCA narrowing and possible proximal R PComA significant stenosis, possible R cavernous ICA stenosis. R>L pleural effusions.    Plan reassess for cerebral angiogram when medically more stable to evaluate basilar artery stenosis  2D Echo EF 20 to 25%.  LA moderately dilated.  LDL 41  HgbA1c 7.3  VTE prophylaxis - Lovenox 40 mg sq daily   aspirin 81 mg daily prior to admission, now on aspirin 325 mg daily and clopidogrel 75 mg daily.   Therapy recommendations:  SNF  Disposition:   SNF  Today, 04/28/2020, Ms. Aull is being seen for hospital follow-up accompanied by facility aide.  Currently residing at [facility] rehab.  Reports residual mild left-sided weakness, dysarthria and gait impairment.  Does report improvement and currently working with PT/OT/SLP.  Denies new or worsening stroke/TIA symptoms.  Per facility [facility], she has remained on aspirin 81 mg daily, Plavix and atorvastatin 40 mg daily.  Blood pressure today 102/69.  Blood pressures monitored at facility and she believes these have been stable.  Glucose levels monitored at facility and she believes these have been stable.  No concerns at this time.   ROS:   14 system review of systems performed and negative with exception of those listed in HPI  PMH:  Past Medical History:  Diagnosis Date  . Abnormal LFTs    a.  05/2014 -  ALT 52, alk phos 130.  Marland Kitchen Chronic systolic CHF (congestive heart failure) ([place])    a. Dx 05/2014 - echo at [facility] - EF 30-35%, moderate LVH, no rWMA, mild to moderate MR, moderate PH with PA pressure 60-34mmHg, mild to moderate pericardial effusion. EF 30-35% by cath.  . History of blood transfusion 1986   "related to C-section"  . Hypertension   . Insulin dependent diabetes mellitus   . Iron  deficiency anemia   . NICM (nonischemic cardiomyopathy) ([place])    a. [place] 06/11/14:  minor nonobstructive CAD with mild irregularities in the LAD less than 10-20%, LCx with mild disease less than 20%, no significant disease in the RCA, EF 30-35%.  . Pericardial effusion    a. Mild-moderate pericardial effusion by echo at [facility].  . Pulmonary hypertension ([place])    a. Moderate PH by echo at [facility].  . Tobacco abuse     PSH:  Past Surgical History:  Procedure Laterality Date  . APPENDECTOMY  ~ 2003  . CESAREAN SECTION  1986  . LEFT HEART CATHETERIZATION WITH CORONARY ANGIOGRAM N/A 06/11/2014   Procedure: LEFT HEART CATHETERIZATION WITH CORONARY ANGIOGRAM;  Surgeon: Peter M Martinique, MD;  Location: [facility] CATH LAB;  Service: Cardiovascular;  Laterality: N/A;    Social History:  Social History   Socioeconomic History  . Marital status: Divorced    Spouse name: Not on file  . Number of children: Not on file  . Years of education: Not on file  . Highest education level: Not on file  Occupational History  . Not on file  Tobacco Use  . Smoking status: Former Smoker    Packs/day: 0.50    Years: 22.00    Pack years: 11.00    Types: Cigarettes    Quit date: 03/26/2015    Years since quitting: 5.0  . Smokeless tobacco: Never Used  Substance and Sexual Activity  . Alcohol use: No  . Drug use: No  . Sexual activity: Yes  Other Topics Concern  . Not on file  Social History Narrative  . Not on file   Social Determinants of Health   Financial Resource Strain: Not on file  Food Insecurity: Not on file  Transportation Needs: Not on file  Physical Activity: Not on file  Stress: Not on file  Social Connections: Not on file  Intimate Partner Violence: Not on file    Family History:  Family History  Problem Relation Age of Onset  . Coronary artery disease Mother   . Diabetes Mother   . Lupus Mother   . Stroke Mother   . Lung cancer Father     Medications:   Current Outpatient  Medications on File Prior to Visit  Medication Sig Dispense Refill  . albuterol (VENTOLIN HFA) 108 (90 Base) MCG/ACT inhaler Inhale 2 puffs into the lungs every 6 (six) hours as needed for wheezing or shortness of breath.    Marland Kitchen aspirin 81 MG EC tablet TAKE 1 TABLET (81 MG TOTAL) BY MOUTH DAILY. (Patient taking differently: Take 81 mg by mouth daily.) 30 tablet 3  . atorvastatin (LIPITOR) 40 MG tablet Take 1 tablet (40 mg total) by mouth every evening. 90 tablet 0  . clopidogrel (PLAVIX) 75 MG tablet Take 1 tablet (75 mg total) by mouth daily. 30 tablet 0  . hydrALAZINE (APRESOLINE) 25 MG tablet Take 0.5 tablets (12.5 mg total) by mouth every 8 (eight) hours. HOLD IF SBP <115 90 tablet 0  . insulin  aspart (NOVOLOG) 100 UNIT/ML injection Inject 0-6 Units into the skin 3 (three) times daily with meals. 10 mL 11  . insulin aspart (NOVOLOG) 100 UNIT/ML injection Inject 5 Units into the skin 3 (three) times daily with meals. 10 mL 11  . insulin glargine (LANTUS) 100 UNIT/ML injection Inject 0.18 mLs (18 Units total) into the skin at bedtime. 10 mL 11  . ivabradine (CORLANOR) 5 MG TABS tablet Take 1 tablet (5 mg total) by mouth 2 (two) times daily with a meal. 90 tablet 11  . metFORMIN (GLUCOPHAGE) 500 MG tablet Take 500 mg by mouth 2 (two) times daily.    . mirtazapine (REMERON SOL-TAB) 15 MG disintegrating tablet Take 0.5 tablets (7.5 mg total) by mouth at bedtime. 30 tablet 0  . ondansetron (ZOFRAN) 4 MG tablet Take by mouth.    . sodium zirconium cyclosilicate (LOKELMA) 10 g PACK packet Take 10 g by mouth 2 (two) times daily. 11 each 0  . sodium zirconium cyclosilicate (LOKELMA) 10 g PACK packet Take by mouth.    . traMADol (ULTRAM) 50 MG tablet Take by mouth.     No current facility-administered medications on file prior to visit.    Allergies:  No Known Allergies    OBJECTIVE:  Physical Exam  Vitals:   04/28/20 1319  BP: 102/69  Pulse: 83   There is no height or weight on file to  calculate BMI. No exam data present  General: Frail very pleasant middle-aged (although elderly appearing) African-American female, seated, in no evident distress Head: head normocephalic and atraumatic.   Neck: supple with no carotid or supraclavicular bruits Cardiovascular: regular rate and rhythm, no murmurs Musculoskeletal: no deformity Skin:  no rash/petichiae Vascular:  Normal pulses all extremities   Neurologic Exam Mental Status: Awake and fully alert.   Mild dysarthria.  Oriented to place and time. Recent and remote memory intact. Attention span, concentration and fund of knowledge appropriate. Mood and affect appropriate.  Cranial Nerves: R Pupil briskly reactive to light.  Chronic left eye blindness.  Extraocular movements full without nystagmus. Visual fields full to confrontation.  Severe HOH bilaterally.  Facial sensation intact.  Mild left nasolabial fold flattening.  Tongue, palate moves normally and symmetrically.  Motor: Normal bulk and tone.  Full strength right upper and lower extremity.  Slightly decreased left hand dexterity and mild left hip flexor weakness Sensory.: intact to touch , pinprick , position and vibratory sensation.  Coordination: Rapid alternating movements normal in all extremities except decreased left hand. Finger-to-nose and heel-to-shin performed accurately bilaterally. Gait and Station: Deferred as rolling walker not present during visit Reflexes: 1+ and symmetric. Toes downgoing.     NIHSS  2 Modified Rankin  3-4      ASSESSMENT: ALANA DAYTON is a 58 y.o. year old female presented with lethargy and left-sided weakness on 03/11/2020 with stroke work-up revealing anterior right thalamic infarct and midbrain infarcts, secondary to small vs large vessel disease although cardioembolic source cannot be completely excluded due to low EF. Vascular risk factors include basilar artery stenosis, cardiomyopathy, acute on chronic systolic CHF with  cardiogenic shock 11/5, HTN, HLD, DM, AKI on CKD, former tobacco use, family history of stroke, CAD and prior strokes on imaging.      PLAN:  1. R thalamic and midbrain strokes :  a. Residual deficit: Mild left hemiparesis, dysarthria and gait impairment. Per patient, continues to improve working with SNF PT/OT/SLP.   b. Continue aspirin 81 mg daily and clopidogrel  75 mg daily  and atorvastatin for secondary stroke prevention.   c. Discussed secondary stroke prevention measures and importance of close PCP follow up for aggressive stroke risk factor management  2. Basilar artery stenosis: Referral placed to IR for reassessment as this was postponed during recent admission due to unstable cardiac conditions 3. HTN: BP goal <130/90.  Stable on low side today currently on hydralazine per cards 4. HLD: LDL goal <70. Recent LDL 41.  Increase atorvastatin from 10 mg to 40 mg daily during stroke admission.  Request follow-up with PCP in the next 1 to 2 months for repeat lipid panel and ongoing prescribing of atorvastatin 5. DMII: A1c goal<7.0. Recent A1c 7.3.  Currently monitored by facility 6. Extensive cardiac history: Routine follow-up with cardiology.  Personally reviewed recent cardiology follow-up note.  Advised to continue DAPT as well as statin usage for hx of CAD.     Follow up in 3 months or call earlier if needed   CC:  [facility] provider: Dr. Cleaster Corin, Jenny Reichmann, MD    I spent 45 minutes of face-to-face and non-face-to-face time with patient and has not paid.  This included previsit chart review including recent hospitalization pertinent progress notes, lab work and imaging, lab review, study review, order entry, electronic health record documentation, patient education regarding recent stroke and etiology, residual deficits, importance of managing stroke risk factors and answered all questions to patient satisfaction   Frann Rider, AGNP-BC  [facility] [address] [place], [place] [address]  Phone [phone] Fax [phone] Note: This document was prepared with digital dictation and possible smart phrase technology. Any transcriptional errors that result from this process are unintentional.

## 2020-04-28 NOTE — Patient Instructions (Signed)
Residual mild left hemiparesis, mild dysarthria and gait impairment - overall, recovering well. 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       Followup in the future with me in 3 months or call earlier if needed       Thank you for coming to see at [facility]. I hope we have been able to provide you high quality care today.  You may receive a patient satisfaction survey over the next few weeks. We would appreciate your feedback and comments so that we may continue to improve ourselves and the health of our patients.

## 2020-04-29 NOTE — Progress Notes (Signed)
I agree with the above plan 

## 2020-05-01 DIAGNOSIS — R488 Other symbolic dysfunctions: Secondary | ICD-10-CM | POA: Diagnosis not present

## 2020-05-01 DIAGNOSIS — I63412 Cerebral infarction due to embolism of left middle cerebral artery: Secondary | ICD-10-CM | POA: Diagnosis not present

## 2020-05-01 DIAGNOSIS — R279 Unspecified lack of coordination: Secondary | ICD-10-CM | POA: Diagnosis not present

## 2020-05-01 DIAGNOSIS — M6281 Muscle weakness (generalized): Secondary | ICD-10-CM | POA: Diagnosis not present

## 2020-05-01 DIAGNOSIS — I651 Occlusion and stenosis of basilar artery: Secondary | ICD-10-CM | POA: Diagnosis not present

## 2020-05-01 DIAGNOSIS — R262 Difficulty in walking, not elsewhere classified: Secondary | ICD-10-CM | POA: Diagnosis not present

## 2020-05-01 DIAGNOSIS — F339 Major depressive disorder, recurrent, unspecified: Secondary | ICD-10-CM | POA: Diagnosis not present

## 2020-05-01 DIAGNOSIS — R2681 Unsteadiness on feet: Secondary | ICD-10-CM | POA: Diagnosis not present

## 2020-05-02 DIAGNOSIS — R488 Other symbolic dysfunctions: Secondary | ICD-10-CM | POA: Diagnosis not present

## 2020-05-02 DIAGNOSIS — I651 Occlusion and stenosis of basilar artery: Secondary | ICD-10-CM | POA: Diagnosis not present

## 2020-05-02 DIAGNOSIS — R2681 Unsteadiness on feet: Secondary | ICD-10-CM | POA: Diagnosis not present

## 2020-05-02 DIAGNOSIS — M6281 Muscle weakness (generalized): Secondary | ICD-10-CM | POA: Diagnosis not present

## 2020-05-02 DIAGNOSIS — R262 Difficulty in walking, not elsewhere classified: Secondary | ICD-10-CM | POA: Diagnosis not present

## 2020-05-02 DIAGNOSIS — I63412 Cerebral infarction due to embolism of left middle cerebral artery: Secondary | ICD-10-CM | POA: Diagnosis not present

## 2020-05-02 DIAGNOSIS — F339 Major depressive disorder, recurrent, unspecified: Secondary | ICD-10-CM | POA: Diagnosis not present

## 2020-05-02 DIAGNOSIS — R279 Unspecified lack of coordination: Secondary | ICD-10-CM | POA: Diagnosis not present

## 2020-05-05 DIAGNOSIS — R2681 Unsteadiness on feet: Secondary | ICD-10-CM | POA: Diagnosis not present

## 2020-05-05 DIAGNOSIS — R488 Other symbolic dysfunctions: Secondary | ICD-10-CM | POA: Diagnosis not present

## 2020-05-05 DIAGNOSIS — I651 Occlusion and stenosis of basilar artery: Secondary | ICD-10-CM | POA: Diagnosis not present

## 2020-05-05 DIAGNOSIS — R279 Unspecified lack of coordination: Secondary | ICD-10-CM | POA: Diagnosis not present

## 2020-05-05 DIAGNOSIS — F339 Major depressive disorder, recurrent, unspecified: Secondary | ICD-10-CM | POA: Diagnosis not present

## 2020-05-05 DIAGNOSIS — I63412 Cerebral infarction due to embolism of left middle cerebral artery: Secondary | ICD-10-CM | POA: Diagnosis not present

## 2020-05-05 DIAGNOSIS — M6281 Muscle weakness (generalized): Secondary | ICD-10-CM | POA: Diagnosis not present

## 2020-05-05 DIAGNOSIS — R262 Difficulty in walking, not elsewhere classified: Secondary | ICD-10-CM | POA: Diagnosis not present

## 2020-05-06 DIAGNOSIS — I651 Occlusion and stenosis of basilar artery: Secondary | ICD-10-CM | POA: Diagnosis not present

## 2020-05-06 DIAGNOSIS — M6281 Muscle weakness (generalized): Secondary | ICD-10-CM | POA: Diagnosis not present

## 2020-05-06 DIAGNOSIS — R262 Difficulty in walking, not elsewhere classified: Secondary | ICD-10-CM | POA: Diagnosis not present

## 2020-05-06 DIAGNOSIS — R279 Unspecified lack of coordination: Secondary | ICD-10-CM | POA: Diagnosis not present

## 2020-05-06 DIAGNOSIS — F339 Major depressive disorder, recurrent, unspecified: Secondary | ICD-10-CM | POA: Diagnosis not present

## 2020-05-06 DIAGNOSIS — R2681 Unsteadiness on feet: Secondary | ICD-10-CM | POA: Diagnosis not present

## 2020-05-06 DIAGNOSIS — R488 Other symbolic dysfunctions: Secondary | ICD-10-CM | POA: Diagnosis not present

## 2020-05-06 DIAGNOSIS — I63412 Cerebral infarction due to embolism of left middle cerebral artery: Secondary | ICD-10-CM | POA: Diagnosis not present

## 2020-05-07 DIAGNOSIS — I63412 Cerebral infarction due to embolism of left middle cerebral artery: Secondary | ICD-10-CM | POA: Diagnosis not present

## 2020-05-07 DIAGNOSIS — I651 Occlusion and stenosis of basilar artery: Secondary | ICD-10-CM | POA: Diagnosis not present

## 2020-05-07 DIAGNOSIS — F339 Major depressive disorder, recurrent, unspecified: Secondary | ICD-10-CM | POA: Diagnosis not present

## 2020-05-07 DIAGNOSIS — M6281 Muscle weakness (generalized): Secondary | ICD-10-CM | POA: Diagnosis not present

## 2020-05-07 DIAGNOSIS — R488 Other symbolic dysfunctions: Secondary | ICD-10-CM | POA: Diagnosis not present

## 2020-05-07 DIAGNOSIS — R279 Unspecified lack of coordination: Secondary | ICD-10-CM | POA: Diagnosis not present

## 2020-05-07 DIAGNOSIS — R2681 Unsteadiness on feet: Secondary | ICD-10-CM | POA: Diagnosis not present

## 2020-05-07 DIAGNOSIS — R262 Difficulty in walking, not elsewhere classified: Secondary | ICD-10-CM | POA: Diagnosis not present

## 2020-05-08 DIAGNOSIS — M6281 Muscle weakness (generalized): Secondary | ICD-10-CM | POA: Diagnosis not present

## 2020-05-08 DIAGNOSIS — I651 Occlusion and stenosis of basilar artery: Secondary | ICD-10-CM | POA: Diagnosis not present

## 2020-05-08 DIAGNOSIS — F339 Major depressive disorder, recurrent, unspecified: Secondary | ICD-10-CM | POA: Diagnosis not present

## 2020-05-08 DIAGNOSIS — R262 Difficulty in walking, not elsewhere classified: Secondary | ICD-10-CM | POA: Diagnosis not present

## 2020-05-08 DIAGNOSIS — R2681 Unsteadiness on feet: Secondary | ICD-10-CM | POA: Diagnosis not present

## 2020-05-08 DIAGNOSIS — R488 Other symbolic dysfunctions: Secondary | ICD-10-CM | POA: Diagnosis not present

## 2020-05-08 DIAGNOSIS — I63412 Cerebral infarction due to embolism of left middle cerebral artery: Secondary | ICD-10-CM | POA: Diagnosis not present

## 2020-05-08 DIAGNOSIS — R279 Unspecified lack of coordination: Secondary | ICD-10-CM | POA: Diagnosis not present

## 2020-05-09 DIAGNOSIS — R279 Unspecified lack of coordination: Secondary | ICD-10-CM | POA: Diagnosis not present

## 2020-05-09 DIAGNOSIS — I651 Occlusion and stenosis of basilar artery: Secondary | ICD-10-CM | POA: Diagnosis not present

## 2020-05-09 DIAGNOSIS — R488 Other symbolic dysfunctions: Secondary | ICD-10-CM | POA: Diagnosis not present

## 2020-05-09 DIAGNOSIS — F339 Major depressive disorder, recurrent, unspecified: Secondary | ICD-10-CM | POA: Diagnosis not present

## 2020-05-09 DIAGNOSIS — R2681 Unsteadiness on feet: Secondary | ICD-10-CM | POA: Diagnosis not present

## 2020-05-09 DIAGNOSIS — M6281 Muscle weakness (generalized): Secondary | ICD-10-CM | POA: Diagnosis not present

## 2020-05-09 DIAGNOSIS — R262 Difficulty in walking, not elsewhere classified: Secondary | ICD-10-CM | POA: Diagnosis not present

## 2020-05-09 DIAGNOSIS — I63412 Cerebral infarction due to embolism of left middle cerebral artery: Secondary | ICD-10-CM | POA: Diagnosis not present

## 2020-05-12 DIAGNOSIS — R488 Other symbolic dysfunctions: Secondary | ICD-10-CM | POA: Diagnosis not present

## 2020-05-12 DIAGNOSIS — M6281 Muscle weakness (generalized): Secondary | ICD-10-CM | POA: Diagnosis not present

## 2020-05-12 DIAGNOSIS — F339 Major depressive disorder, recurrent, unspecified: Secondary | ICD-10-CM | POA: Diagnosis not present

## 2020-05-12 DIAGNOSIS — R262 Difficulty in walking, not elsewhere classified: Secondary | ICD-10-CM | POA: Diagnosis not present

## 2020-05-12 DIAGNOSIS — R2681 Unsteadiness on feet: Secondary | ICD-10-CM | POA: Diagnosis not present

## 2020-05-12 DIAGNOSIS — I651 Occlusion and stenosis of basilar artery: Secondary | ICD-10-CM | POA: Diagnosis not present

## 2020-05-12 DIAGNOSIS — R279 Unspecified lack of coordination: Secondary | ICD-10-CM | POA: Diagnosis not present

## 2020-05-12 DIAGNOSIS — I63412 Cerebral infarction due to embolism of left middle cerebral artery: Secondary | ICD-10-CM | POA: Diagnosis not present

## 2020-05-13 DIAGNOSIS — F339 Major depressive disorder, recurrent, unspecified: Secondary | ICD-10-CM | POA: Diagnosis not present

## 2020-05-13 DIAGNOSIS — M6281 Muscle weakness (generalized): Secondary | ICD-10-CM | POA: Diagnosis not present

## 2020-05-13 DIAGNOSIS — I651 Occlusion and stenosis of basilar artery: Secondary | ICD-10-CM | POA: Diagnosis not present

## 2020-05-13 DIAGNOSIS — I63412 Cerebral infarction due to embolism of left middle cerebral artery: Secondary | ICD-10-CM | POA: Diagnosis not present

## 2020-05-13 DIAGNOSIS — R279 Unspecified lack of coordination: Secondary | ICD-10-CM | POA: Diagnosis not present

## 2020-05-13 DIAGNOSIS — R262 Difficulty in walking, not elsewhere classified: Secondary | ICD-10-CM | POA: Diagnosis not present

## 2020-05-13 DIAGNOSIS — R488 Other symbolic dysfunctions: Secondary | ICD-10-CM | POA: Diagnosis not present

## 2020-05-13 DIAGNOSIS — R2681 Unsteadiness on feet: Secondary | ICD-10-CM | POA: Diagnosis not present

## 2020-05-14 DIAGNOSIS — R279 Unspecified lack of coordination: Secondary | ICD-10-CM | POA: Diagnosis not present

## 2020-05-14 DIAGNOSIS — R2681 Unsteadiness on feet: Secondary | ICD-10-CM | POA: Diagnosis not present

## 2020-05-14 DIAGNOSIS — I63412 Cerebral infarction due to embolism of left middle cerebral artery: Secondary | ICD-10-CM | POA: Diagnosis not present

## 2020-05-14 DIAGNOSIS — F32A Depression, unspecified: Secondary | ICD-10-CM | POA: Diagnosis not present

## 2020-05-14 DIAGNOSIS — R488 Other symbolic dysfunctions: Secondary | ICD-10-CM | POA: Diagnosis not present

## 2020-05-14 DIAGNOSIS — F339 Major depressive disorder, recurrent, unspecified: Secondary | ICD-10-CM | POA: Diagnosis not present

## 2020-05-14 DIAGNOSIS — I651 Occlusion and stenosis of basilar artery: Secondary | ICD-10-CM | POA: Diagnosis not present

## 2020-05-14 DIAGNOSIS — R262 Difficulty in walking, not elsewhere classified: Secondary | ICD-10-CM | POA: Diagnosis not present

## 2020-05-14 DIAGNOSIS — M6281 Muscle weakness (generalized): Secondary | ICD-10-CM | POA: Diagnosis not present

## 2020-05-14 DIAGNOSIS — G4700 Insomnia, unspecified: Secondary | ICD-10-CM | POA: Diagnosis not present

## 2020-05-15 DIAGNOSIS — R488 Other symbolic dysfunctions: Secondary | ICD-10-CM | POA: Diagnosis not present

## 2020-05-15 DIAGNOSIS — R279 Unspecified lack of coordination: Secondary | ICD-10-CM | POA: Diagnosis not present

## 2020-05-15 DIAGNOSIS — I651 Occlusion and stenosis of basilar artery: Secondary | ICD-10-CM | POA: Diagnosis not present

## 2020-05-15 DIAGNOSIS — F339 Major depressive disorder, recurrent, unspecified: Secondary | ICD-10-CM | POA: Diagnosis not present

## 2020-05-15 DIAGNOSIS — R2681 Unsteadiness on feet: Secondary | ICD-10-CM | POA: Diagnosis not present

## 2020-05-15 DIAGNOSIS — I63412 Cerebral infarction due to embolism of left middle cerebral artery: Secondary | ICD-10-CM | POA: Diagnosis not present

## 2020-05-15 DIAGNOSIS — M6281 Muscle weakness (generalized): Secondary | ICD-10-CM | POA: Diagnosis not present

## 2020-05-15 DIAGNOSIS — R262 Difficulty in walking, not elsewhere classified: Secondary | ICD-10-CM | POA: Diagnosis not present

## 2020-05-16 DIAGNOSIS — I63412 Cerebral infarction due to embolism of left middle cerebral artery: Secondary | ICD-10-CM | POA: Diagnosis not present

## 2020-05-16 DIAGNOSIS — R488 Other symbolic dysfunctions: Secondary | ICD-10-CM | POA: Diagnosis not present

## 2020-05-16 DIAGNOSIS — R279 Unspecified lack of coordination: Secondary | ICD-10-CM | POA: Diagnosis not present

## 2020-05-16 DIAGNOSIS — R2681 Unsteadiness on feet: Secondary | ICD-10-CM | POA: Diagnosis not present

## 2020-05-16 DIAGNOSIS — F339 Major depressive disorder, recurrent, unspecified: Secondary | ICD-10-CM | POA: Diagnosis not present

## 2020-05-16 DIAGNOSIS — M6281 Muscle weakness (generalized): Secondary | ICD-10-CM | POA: Diagnosis not present

## 2020-05-16 DIAGNOSIS — I651 Occlusion and stenosis of basilar artery: Secondary | ICD-10-CM | POA: Diagnosis not present

## 2020-05-16 DIAGNOSIS — R262 Difficulty in walking, not elsewhere classified: Secondary | ICD-10-CM | POA: Diagnosis not present

## 2020-05-17 DIAGNOSIS — I63412 Cerebral infarction due to embolism of left middle cerebral artery: Secondary | ICD-10-CM | POA: Diagnosis not present

## 2020-05-17 DIAGNOSIS — R488 Other symbolic dysfunctions: Secondary | ICD-10-CM | POA: Diagnosis not present

## 2020-05-17 DIAGNOSIS — M6281 Muscle weakness (generalized): Secondary | ICD-10-CM | POA: Diagnosis not present

## 2020-05-17 DIAGNOSIS — F339 Major depressive disorder, recurrent, unspecified: Secondary | ICD-10-CM | POA: Diagnosis not present

## 2020-05-17 DIAGNOSIS — I651 Occlusion and stenosis of basilar artery: Secondary | ICD-10-CM | POA: Diagnosis not present

## 2020-05-17 DIAGNOSIS — R262 Difficulty in walking, not elsewhere classified: Secondary | ICD-10-CM | POA: Diagnosis not present

## 2020-05-17 DIAGNOSIS — R2681 Unsteadiness on feet: Secondary | ICD-10-CM | POA: Diagnosis not present

## 2020-05-17 DIAGNOSIS — R279 Unspecified lack of coordination: Secondary | ICD-10-CM | POA: Diagnosis not present

## 2020-05-19 DIAGNOSIS — I651 Occlusion and stenosis of basilar artery: Secondary | ICD-10-CM | POA: Diagnosis not present

## 2020-05-19 DIAGNOSIS — R2681 Unsteadiness on feet: Secondary | ICD-10-CM | POA: Diagnosis not present

## 2020-05-19 DIAGNOSIS — M6281 Muscle weakness (generalized): Secondary | ICD-10-CM | POA: Diagnosis not present

## 2020-05-19 DIAGNOSIS — F339 Major depressive disorder, recurrent, unspecified: Secondary | ICD-10-CM | POA: Diagnosis not present

## 2020-05-19 DIAGNOSIS — R279 Unspecified lack of coordination: Secondary | ICD-10-CM | POA: Diagnosis not present

## 2020-05-19 DIAGNOSIS — R262 Difficulty in walking, not elsewhere classified: Secondary | ICD-10-CM | POA: Diagnosis not present

## 2020-05-19 DIAGNOSIS — R488 Other symbolic dysfunctions: Secondary | ICD-10-CM | POA: Diagnosis not present

## 2020-05-19 DIAGNOSIS — I63412 Cerebral infarction due to embolism of left middle cerebral artery: Secondary | ICD-10-CM | POA: Diagnosis not present

## 2020-05-20 DIAGNOSIS — R279 Unspecified lack of coordination: Secondary | ICD-10-CM | POA: Diagnosis not present

## 2020-05-20 DIAGNOSIS — R488 Other symbolic dysfunctions: Secondary | ICD-10-CM | POA: Diagnosis not present

## 2020-05-20 DIAGNOSIS — F339 Major depressive disorder, recurrent, unspecified: Secondary | ICD-10-CM | POA: Diagnosis not present

## 2020-05-20 DIAGNOSIS — R2681 Unsteadiness on feet: Secondary | ICD-10-CM | POA: Diagnosis not present

## 2020-05-20 DIAGNOSIS — I63412 Cerebral infarction due to embolism of left middle cerebral artery: Secondary | ICD-10-CM | POA: Diagnosis not present

## 2020-05-20 DIAGNOSIS — M6281 Muscle weakness (generalized): Secondary | ICD-10-CM | POA: Diagnosis not present

## 2020-05-20 DIAGNOSIS — R262 Difficulty in walking, not elsewhere classified: Secondary | ICD-10-CM | POA: Diagnosis not present

## 2020-05-20 DIAGNOSIS — I651 Occlusion and stenosis of basilar artery: Secondary | ICD-10-CM | POA: Diagnosis not present

## 2020-05-21 DIAGNOSIS — I63412 Cerebral infarction due to embolism of left middle cerebral artery: Secondary | ICD-10-CM | POA: Diagnosis not present

## 2020-05-21 DIAGNOSIS — I651 Occlusion and stenosis of basilar artery: Secondary | ICD-10-CM | POA: Diagnosis not present

## 2020-05-21 DIAGNOSIS — F339 Major depressive disorder, recurrent, unspecified: Secondary | ICD-10-CM | POA: Diagnosis not present

## 2020-05-21 DIAGNOSIS — R488 Other symbolic dysfunctions: Secondary | ICD-10-CM | POA: Diagnosis not present

## 2020-05-21 DIAGNOSIS — R2681 Unsteadiness on feet: Secondary | ICD-10-CM | POA: Diagnosis not present

## 2020-05-21 DIAGNOSIS — M6281 Muscle weakness (generalized): Secondary | ICD-10-CM | POA: Diagnosis not present

## 2020-05-21 DIAGNOSIS — R279 Unspecified lack of coordination: Secondary | ICD-10-CM | POA: Diagnosis not present

## 2020-05-21 DIAGNOSIS — R262 Difficulty in walking, not elsewhere classified: Secondary | ICD-10-CM | POA: Diagnosis not present

## 2020-05-22 DIAGNOSIS — R488 Other symbolic dysfunctions: Secondary | ICD-10-CM | POA: Diagnosis not present

## 2020-05-22 DIAGNOSIS — R262 Difficulty in walking, not elsewhere classified: Secondary | ICD-10-CM | POA: Diagnosis not present

## 2020-05-22 DIAGNOSIS — I651 Occlusion and stenosis of basilar artery: Secondary | ICD-10-CM | POA: Diagnosis not present

## 2020-05-22 DIAGNOSIS — M6281 Muscle weakness (generalized): Secondary | ICD-10-CM | POA: Diagnosis not present

## 2020-05-22 DIAGNOSIS — I63412 Cerebral infarction due to embolism of left middle cerebral artery: Secondary | ICD-10-CM | POA: Diagnosis not present

## 2020-05-22 DIAGNOSIS — F339 Major depressive disorder, recurrent, unspecified: Secondary | ICD-10-CM | POA: Diagnosis not present

## 2020-05-22 DIAGNOSIS — R2681 Unsteadiness on feet: Secondary | ICD-10-CM | POA: Diagnosis not present

## 2020-05-22 DIAGNOSIS — R279 Unspecified lack of coordination: Secondary | ICD-10-CM | POA: Diagnosis not present

## 2020-05-23 DIAGNOSIS — M6281 Muscle weakness (generalized): Secondary | ICD-10-CM | POA: Diagnosis not present

## 2020-05-23 DIAGNOSIS — R488 Other symbolic dysfunctions: Secondary | ICD-10-CM | POA: Diagnosis not present

## 2020-05-23 DIAGNOSIS — R262 Difficulty in walking, not elsewhere classified: Secondary | ICD-10-CM | POA: Diagnosis not present

## 2020-05-23 DIAGNOSIS — R279 Unspecified lack of coordination: Secondary | ICD-10-CM | POA: Diagnosis not present

## 2020-05-23 DIAGNOSIS — F339 Major depressive disorder, recurrent, unspecified: Secondary | ICD-10-CM | POA: Diagnosis not present

## 2020-05-23 DIAGNOSIS — I651 Occlusion and stenosis of basilar artery: Secondary | ICD-10-CM | POA: Diagnosis not present

## 2020-05-23 DIAGNOSIS — I63412 Cerebral infarction due to embolism of left middle cerebral artery: Secondary | ICD-10-CM | POA: Diagnosis not present

## 2020-05-23 DIAGNOSIS — R2681 Unsteadiness on feet: Secondary | ICD-10-CM | POA: Diagnosis not present

## 2020-05-26 DIAGNOSIS — F339 Major depressive disorder, recurrent, unspecified: Secondary | ICD-10-CM | POA: Diagnosis not present

## 2020-05-26 DIAGNOSIS — R279 Unspecified lack of coordination: Secondary | ICD-10-CM | POA: Diagnosis not present

## 2020-05-26 DIAGNOSIS — R262 Difficulty in walking, not elsewhere classified: Secondary | ICD-10-CM | POA: Diagnosis not present

## 2020-05-26 DIAGNOSIS — I63412 Cerebral infarction due to embolism of left middle cerebral artery: Secondary | ICD-10-CM | POA: Diagnosis not present

## 2020-05-26 DIAGNOSIS — R2681 Unsteadiness on feet: Secondary | ICD-10-CM | POA: Diagnosis not present

## 2020-05-26 DIAGNOSIS — R488 Other symbolic dysfunctions: Secondary | ICD-10-CM | POA: Diagnosis not present

## 2020-05-26 DIAGNOSIS — M6281 Muscle weakness (generalized): Secondary | ICD-10-CM | POA: Diagnosis not present

## 2020-05-26 DIAGNOSIS — I651 Occlusion and stenosis of basilar artery: Secondary | ICD-10-CM | POA: Diagnosis not present

## 2020-05-27 DIAGNOSIS — F32A Depression, unspecified: Secondary | ICD-10-CM | POA: Diagnosis not present

## 2020-05-27 DIAGNOSIS — G4700 Insomnia, unspecified: Secondary | ICD-10-CM | POA: Diagnosis not present

## 2020-05-28 DIAGNOSIS — E119 Type 2 diabetes mellitus without complications: Secondary | ICD-10-CM | POA: Diagnosis not present

## 2020-05-28 DIAGNOSIS — I639 Cerebral infarction, unspecified: Secondary | ICD-10-CM | POA: Diagnosis not present

## 2020-05-28 DIAGNOSIS — I509 Heart failure, unspecified: Secondary | ICD-10-CM | POA: Diagnosis not present

## 2020-06-08 NOTE — Progress Notes (Incomplete)
Advanced Heart Failure Clinic Note   Referring Physician: PCP: Claiborne Billings, MD PCP-Cardiologist: No primary care provider on file.  AHFC: Dr. Haroldine Laws   HPI: 60 y/o female w/ h/o tobacco abuse, IDDM, family h/o premature ischemic heart disease (mother w/ MI in her 45s), and chronic systolic heart failure, first diagnosed in 2016 when she was admitted to [facility] for acute CHF. Echo at that time showed reduced LVEF 30-35% w/ mod LVH, mild-mod MR and mod PH w/ PA pressure 60-65 mmHg. She had diagnostic LHC performed by Dr. Martinique which showed mild nonobstructive CAD (10-20% LAD and 20% LCx disease, RCA was small, nondominant w/ no significant coronary plaque). She was placed on medical therapy for systolic heart failure w/ an ACEi + ? blocker. She was seen by general cardiology for her post hospital f/u, however was lost to f/u thereafter.   She was recently admitted 11/2 for acute CVA w/ with left-sided weakness, impaired extraocular movement, right eyelid droop. CT of Head and Neck showed distal basilar artery stenosis as well as acute thalamic stroke.  As part of stroke w/u, 2D echo was perfomed and showed no intracardiac source of embolus. EF however was lower compared to study in 2016, now 20-25% w/ moderately reduced RV systolic function, severe LVH, severe MR and severe TR. Echo was suggestive of cardiac amyloidosis but PYP was negative for TTR amyloid.  cMRI not supportive of amyloid.  SPEP negative. Kappa light chains mildly elevated. Suspect non-specific. 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  During hospitalization, she developed cardiogenic shock and required milrinone and IV lasix. She appeared inotrope dependent but was not a candidate for home inotropes or advanced therapies due to other conformities. Also nursing facility dependent. Milrinone was weaned off. Co-ox remained ~50% off milrinone.   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  Unable to tolerate spiro or ARB/ARNI due to hyperkalemia. Bidil not well tolerated due to HAs. Now on hydralazine as monotherapy. B-blockers avoided due to recent shock. SGLT2is deferred due to frequent UTIs and poor perineal hygiene.   She was discharged to SNF on Lasix 40, Ivabradine 5 mg bid, hydralazine 12.5 tid and daily Lokelama to help w/ persistent hyperkalemia.   She presented to clinic 12/21 for f/u. Here w/ aid from SNF. In wheel chair, frail appearing. 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 Based on [facility] from SNF, she has been getting prescribed meds. BP soft today 90 systolic. Complains of abdominal pain, nausea and vomiting + Poor PO intake over the weekend. Looks dry on exam. Lasix was held x 1 day, then reduced to alternating doses 40 mg one day/ 20 mg the next day.   Today she returns for HF follow up. Overall feeling fine. Denies increasing SOB, CP, dizziness, edema, or PND/Orthopnea. Appetite ok. No fever or chills. Weight at home 170 pounds. Taking all medications.    Review of systems complete and found to be negative unless listed in HPI.      Past Medical History:  Diagnosis Date  . Abnormal LFTs    a. 05/2014 -  ALT 52, alk phos 130.  Marland Kitchen Chronic systolic CHF (congestive heart failure) ([place])    a. Dx 05/2014 - echo at [facility] - EF 30-35%, moderate LVH, no rWMA, mild to moderate MR, moderate PH with PA pressure 60-4mmHg, mild to moderate pericardial effusion.  EF 30-35% by cath.  . History of blood transfusion 1986   "related to C-section"  . Hypertension   . Insulin dependent diabetes mellitus   . Iron deficiency anemia   . NICM (nonischemic cardiomyopathy) ([place])    a. [place] 06/11/14:  minor nonobstructive CAD with mild irregularities in the LAD less than 10-20%, LCx with mild disease less than 20%, no significant disease in the RCA, EF 30-35%.  . Pericardial effusion    a.  Mild-moderate pericardial effusion by echo at [facility].  . Pulmonary hypertension ([place])    a. Moderate PH by echo at [facility].  . Tobacco abuse     Current Outpatient Medications  Medication Sig Dispense Refill  . albuterol (VENTOLIN HFA) 108 (90 Base) MCG/ACT inhaler Inhale 2 puffs into the lungs every 6 (six) hours as needed for wheezing or shortness of breath.    Marland Kitchen aspirin 81 MG EC tablet TAKE 1 TABLET (81 MG TOTAL) BY MOUTH DAILY. (Patient taking differently: Take 81 mg by mouth daily.) 30 tablet 3  . atorvastatin (LIPITOR) 40 MG tablet Take 1 tablet (40 mg total) by mouth every evening. 90 tablet 0  . clopidogrel (PLAVIX) 75 MG tablet Take 1 tablet (75 mg total) by mouth daily. 30 tablet 0  . hydrALAZINE (APRESOLINE) 25 MG tablet Take 0.5 tablets (12.5 mg total) by mouth every 8 (eight) hours. HOLD IF SBP <115 90 tablet 0  . insulin aspart (NOVOLOG) 100 UNIT/ML injection Inject 0-6 Units into the skin 3 (three) times daily with meals. 10 mL 11  . insulin aspart (NOVOLOG) 100 UNIT/ML injection Inject 5 Units into the skin 3 (three) times daily with meals. 10 mL 11  . insulin glargine (LANTUS) 100 UNIT/ML injection Inject 0.18 mLs (18 Units total) into the skin at bedtime. 10 mL 11  . ivabradine (CORLANOR) 5 MG TABS tablet Take 1 tablet (5 mg total) by mouth 2 (two) times daily with a meal. 90 tablet 11  . metFORMIN (GLUCOPHAGE) 500 MG tablet Take 500 mg by mouth 2 (two) times daily.    . mirtazapine (REMERON SOL-TAB) 15 MG disintegrating tablet Take 0.5 tablets (7.5 mg total) by mouth at bedtime. 30 tablet 0  . ondansetron (ZOFRAN) 4 MG tablet Take by mouth.    . sodium zirconium cyclosilicate (LOKELMA) 10 g PACK packet Take 10 g by mouth 2 (two) times daily. 11 each 0  . sodium zirconium cyclosilicate (LOKELMA) 10 g PACK packet Take by mouth.     No current facility-administered medications for this visit.    No Known Allergies    Social History   Socioeconomic History  . Marital  status: Divorced    Spouse name: Not on file  . Number of children: Not on file  . Years of education: Not on file  . Highest education level: Not on file  Occupational History  . Not on file  Tobacco Use  . Smoking status: Former Smoker    Packs/day: 0.50    Years: 22.00    Pack years: 11.00    Types: Cigarettes    Quit date: 03/26/2015    Years since quitting: 5.2  . Smokeless tobacco: Never Used  Substance and Sexual Activity  . Alcohol use: No  . Drug use: No  . Sexual activity: Yes  Other Topics Concern  . Not on file  Social History Narrative  . Not on file   Social Determinants of Health   Financial Resource Strain: Not on file  Food Insecurity:  Not on file  Transportation Needs: Not on file  Physical Activity: Not on file  Stress: Not on file  Social Connections: Not on file  Intimate Partner Violence: Not on file     Family History  Problem Relation Age of Onset  . Coronary artery disease Mother   . Diabetes Mother   . Lupus Mother   . Stroke Mother   . Lung cancer Father    There were no vitals filed for this visit.  PHYSICAL EXAM: General:  Thin/ frail appearing female in wheelchair, looks much older than actual age. No respiratory difficulty HEENT: hard of hearing, blind in left eye Neck: supple. no JVD. Carotids 2+ bilat; no bruits. No lymphadenopathy or thyromegaly appreciated. Cor: PMI nondisplaced. Regular rate & rhythm. No rubs, gallops or murmurs. Lungs: clear, no wheezing  Abdomen: soft, nontender, nondistended. No hepatosplenomegaly. No bruits or masses. Good bowel sounds. Extremities: thin extremities, no cyanosis, clubbing, rash, edema, legs warm  Neuro: alert & oriented x 3, cranial nerves grossly intact. moves all 4 extremities w/o difficulty. Affect pleasant.  ECG: not performed   ASSESSMENT & PLAN:  1. Chronic Systolic Heart Failure  - Echo 2016 EF 30-35%, RV moderately reduced. LHC showed mild nonobstructive CAD - Echo 11/21 EF  20-25%, RV severely reduced, severe LVH and thicken valves concerning for AL amyloid. Echo suggestive of cardiac amyloidosis but PYP was negative for TTR amyloid.  cMRI not supportive of amyloid.  SPEP negative. Kappa light chains mildly elevated. Suspect non-specific. 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 - required milrinone recent admit but not candidate for home inotropes or advanced therapies.  - GDMT limited by persistently elevated K - Off spiro, Entresto and digoxin in setting of persistently elevated K+  - Unable to tolerate Bidil due to HAs.  - On hydralazine 12.5 mg tid. Will not titrate further given soft BP   - Continue corlanor 5 mg bid  - not a good candidate for SGLT2i due to frequent UTIs and poor perineal hygiene  - denies dyspnea and appears dry on exam today, BP low,  complains of abdominal pain, n/v and poor PO intake. May be due to ? Low output - Check CMP to assess SCr/ hepatic enzymes - hold lasix x 1 day, then reduce to alternating doses 40 mg one day/ 20 mg the next day.  - SNF given instructions to monitor wt. Can increase lasix back to 40 mg daily if volume status creeps up   2. Recent CVA 11/21  - CT of head and neck showedbasilar artery stenosis as well as acute thalamic stroke. -no intracardiac source of embolusnoted on echo and no afib on tele  - placed on ASA, Plavix and statin per neuro  - at SNF for rehab   3. IDDM  - on insulin   4. CAD - mild nonobstructive CAD by cath in 2016, w/10-20% LAD and 20% LCx disease, RCA was small, nondominant w/ no significant coronary plaque - denies CP  - continue medical management, ASA 81 mg, Plavix 75 mg, and Lipitor 40 mg  5.  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  - check BMP today     Rafael Bihari,  FNP 06/08/20

## 2020-06-10 ENCOUNTER — Encounter: Payer: Self-pay | Admitting: General Practice

## 2020-06-10 ENCOUNTER — Encounter (HOSPITAL_COMMUNITY): Payer: BC Managed Care – PPO

## 2020-06-20 DIAGNOSIS — Z789 Other specified health status: Secondary | ICD-10-CM | POA: Insufficient documentation

## 2020-07-02 DIAGNOSIS — I509 Heart failure, unspecified: Secondary | ICD-10-CM | POA: Diagnosis not present

## 2020-07-02 DIAGNOSIS — I639 Cerebral infarction, unspecified: Secondary | ICD-10-CM | POA: Diagnosis not present

## 2020-07-02 DIAGNOSIS — E119 Type 2 diabetes mellitus without complications: Secondary | ICD-10-CM | POA: Diagnosis not present

## 2020-07-16 ENCOUNTER — Ambulatory Visit: Payer: BC Managed Care – PPO | Admitting: Cardiology

## 2020-07-16 DIAGNOSIS — G4700 Insomnia, unspecified: Secondary | ICD-10-CM | POA: Diagnosis not present

## 2020-07-16 DIAGNOSIS — F32A Depression, unspecified: Secondary | ICD-10-CM | POA: Diagnosis not present

## 2020-07-18 ENCOUNTER — Other Ambulatory Visit: Payer: Self-pay

## 2020-07-18 ENCOUNTER — Ambulatory Visit (INDEPENDENT_AMBULATORY_CARE_PROVIDER_SITE_OTHER): Payer: BC Managed Care – PPO | Admitting: Cardiology

## 2020-07-18 ENCOUNTER — Encounter: Payer: Self-pay | Admitting: Cardiology

## 2020-07-18 VITALS — BP 110/70 | HR 96 | Ht 60.0 in | Wt 96.6 lb

## 2020-07-18 DIAGNOSIS — I428 Other cardiomyopathies: Secondary | ICD-10-CM | POA: Diagnosis not present

## 2020-07-18 DIAGNOSIS — I5020 Unspecified systolic (congestive) heart failure: Secondary | ICD-10-CM | POA: Insufficient documentation

## 2020-07-18 DIAGNOSIS — E118 Type 2 diabetes mellitus with unspecified complications: Secondary | ICD-10-CM

## 2020-07-18 DIAGNOSIS — E782 Mixed hyperlipidemia: Secondary | ICD-10-CM

## 2020-07-18 DIAGNOSIS — Z8673 Personal history of transient ischemic attack (TIA), and cerebral infarction without residual deficits: Secondary | ICD-10-CM

## 2020-07-18 DIAGNOSIS — Z794 Long term (current) use of insulin: Secondary | ICD-10-CM | POA: Insufficient documentation

## 2020-07-18 DIAGNOSIS — R931 Abnormal findings on diagnostic imaging of heart and coronary circulation: Secondary | ICD-10-CM | POA: Insufficient documentation

## 2020-07-18 DIAGNOSIS — Z720 Tobacco use: Secondary | ICD-10-CM

## 2020-07-18 NOTE — Patient Instructions (Signed)
Medication Instructions:  Your physician recommends that you continue on your current medications as directed. Please refer to the Current Medication list given to you today.  *If you need a refill on your cardiac medications before your next appointment, please call your pharmacy*   Lab Work: Your physician recommends that you return for lab work: TODAY: BMET, Mag If you have labs (blood work) drawn today and your tests are completely normal, you will receive your results only by: . MyChart Message (if you have MyChart) OR . A paper copy in the mail If you have any lab test that is abnormal or we need to change your treatment, we will call you to review the results.   Testing/Procedures: None   Follow-Up: At [facility], you and your health needs are our priority.  As part of our continuing mission to provide you with exceptional heart care, we have created designated Provider Care Teams.  These Care Teams include your primary Cardiologist (physician) and Advanced Practice Providers (APPs -  Physician Assistants and Nurse Practitioners) who all work together to provide you with the care you need, when you need it.  We recommend signing up for the patient portal called "MyChart".  Sign up information is provided on this After Visit Summary.  MyChart is used to connect with patients for Virtual Visits (Telemedicine).  Patients are able to view lab/test results, encounter notes, upcoming appointments, etc.  Non-urgent messages can be sent to your provider as well.   To learn more about what you can do with MyChart, go to [email or web address].    Your next appointment:   3 month(s)  The format for your next appointment:   In Person  Provider:   Kardie Tobb, DO   Other Instructions   

## 2020-07-18 NOTE — Progress Notes (Signed)
Cardiology Office Note:    Date:  07/18/2020   ID:  Christine Benitez, DOB Mar 07, 1961, MRN [id number]  PCP:  Claiborne Billings, MD  Cardiologist:  Berniece Salines, DO  Electrophysiologist:  None   Referring MD: Leonides Sake, MD   I am fine  History of Present Illness:    Christine Benitez is a 60 y.o. female with a hx of chronic systolic heart failure EF 20 to 25% on her recent echocardiogram which was done in November 2021 per her charts and information from care everywhere the patient did have a heart catheterization in February 2016 which show nonischemic cardiomyopathy minor nonobstructive CAD with mild irregularities, minimal nonobstructive coronary artery disease, insulin-dependent diabetes, iron deficiency anemia, nonischemic cardiomyopathy, pulmonary hypertension, history of recent stroke and smoking.  The patient is here with her aide from the nursing facility, the patient is a very poor historian.  I am not sure if she has insight of what is going on with her health at this time.  She does not offer any complaints today.  Past Medical History:  Diagnosis Date  . Abnormal LFTs    a. 05/2014 -  ALT 52, alk phos 130.  Marland Kitchen Chronic systolic CHF (congestive heart failure) ([place])    a. Dx 05/2014 - echo at [facility] - EF 30-35%, moderate LVH, no rWMA, mild to moderate MR, moderate PH with PA pressure 60-82mmHg, mild to moderate pericardial effusion. EF 30-35% by cath.  . History of blood transfusion 1986   "related to C-section"  . Hypertension   . Insulin dependent diabetes mellitus   . Iron deficiency anemia   . NICM (nonischemic cardiomyopathy) ([place])    a. [place] 06/11/14:  minor nonobstructive CAD with mild irregularities in the LAD less than 10-20%, LCx with mild disease less than 20%, no significant disease in the RCA, EF 30-35%.  . Pericardial effusion    a. Mild-moderate pericardial effusion by echo at [facility].  . Pulmonary hypertension ([place])    a. Moderate PH by echo at [facility].  .  Tobacco abuse     Past Surgical History:  Procedure Laterality Date  . APPENDECTOMY  ~ 2003  . CESAREAN SECTION  1986  . LEFT HEART CATHETERIZATION WITH CORONARY ANGIOGRAM N/A 06/11/2014   Procedure: LEFT HEART CATHETERIZATION WITH CORONARY ANGIOGRAM;  Surgeon: Peter M Martinique, MD;  Location: [facility] CATH LAB;  Service: Cardiovascular;  Laterality: N/A;    Current Medications: Current Meds  Medication Sig  . albuterol (VENTOLIN HFA) 108 (90 Base) MCG/ACT inhaler Inhale 2 puffs into the lungs every 6 (six) hours as needed for wheezing or shortness of breath.  Marland Kitchen aspirin 81 MG EC tablet TAKE 1 TABLET (81 MG TOTAL) BY MOUTH DAILY. (Patient taking differently: Take 81 mg by mouth daily.)  . atorvastatin (LIPITOR) 40 MG tablet Take 1 tablet (40 mg total) by mouth every evening.  . clopidogrel (PLAVIX) 75 MG tablet Take 1 tablet (75 mg total) by mouth daily.  . hydrALAZINE (APRESOLINE) 25 MG tablet Take 0.5 tablets (12.5 mg total) by mouth every 8 (eight) hours. HOLD IF SBP <115 (Patient taking differently: Take 12.5 mg by mouth 2 (two) times daily. HOLD IF SBP <115)  . insulin glargine (LANTUS) 100 UNIT/ML injection Inject 0.18 mLs (18 Units total) into the skin at bedtime.  . insulin lispro (HUMALOG) 100 UNIT/ML KwikPen Inject 5 Units into the skin 3 (three) times daily before meals.  . ivabradine (CORLANOR) 5 MG TABS tablet Take 1 tablet (5 mg  total) by mouth 2 (two) times daily with a meal.  . metFORMIN (GLUCOPHAGE) 500 MG tablet Take 500 mg by mouth 2 (two) times daily.  . mirtazapine (REMERON SOL-TAB) 15 MG disintegrating tablet Take 0.5 tablets (7.5 mg total) by mouth at bedtime.  . ondansetron (ZOFRAN) 4 MG tablet Take by mouth.  . sodium zirconium cyclosilicate (LOKELMA) 10 g PACK packet Take 10 g by mouth 2 (two) times daily.  . traMADol (ULTRAM) 50 MG tablet Take 50 mg by mouth every 6 (six) hours as needed.     Allergies:   Patient has no known allergies.   Social History    Socioeconomic History  . Marital status: Divorced    Spouse name: Not on file  . Number of children: Not on file  . Years of education: Not on file  . Highest education level: Not on file  Occupational History  . Not on file  Tobacco Use  . Smoking status: Former Smoker    Packs/day: 0.50    Years: 22.00    Pack years: 11.00    Types: Cigarettes    Quit date: 03/26/2015    Years since quitting: 5.3  . Smokeless tobacco: Never Used  Substance and Sexual Activity  . Alcohol use: No  . Drug use: No  . Sexual activity: Yes  Other Topics Concern  . Not on file  Social History Narrative  . Not on file   Social Determinants of Health   Financial Resource Strain: Not on file  Food Insecurity: Not on file  Transportation Needs: Not on file  Physical Activity: Not on file  Stress: Not on file  Social Connections: Not on file     Family History: The patient's family history includes Coronary artery disease in her mother; Diabetes in her mother; Lung cancer in her father; Lupus in her mother; Stroke in her mother.  ROS:   Review of Systems  Constitution: Negative for decreased appetite, fever and weight gain.  HENT: Negative for congestion, ear discharge, hoarse voice and sore throat.   Eyes: Negative for discharge, redness, vision loss in right eye and visual halos.  Cardiovascular: Negative for chest pain, dyspnea on exertion, leg swelling, orthopnea and palpitations.  Respiratory: Negative for cough, hemoptysis, shortness of breath and snoring.   Endocrine: Negative for heat intolerance and polyphagia.  Hematologic/Lymphatic: Negative for bleeding problem. Does not bruise/bleed easily.  Skin: Negative for flushing, nail changes, rash and suspicious lesions.  Musculoskeletal: Negative for arthritis, joint pain, muscle cramps, myalgias, neck pain and stiffness.  Gastrointestinal: Negative for abdominal pain, bowel incontinence, diarrhea and excessive appetite.   Genitourinary: Negative for decreased libido, genital sores and incomplete emptying.  Neurological: Negative for brief paralysis, focal weakness, headaches and loss of balance.  Psychiatric/Behavioral: Negative for altered mental status, depression and suicidal ideas.  Allergic/Immunologic: Negative for HIV exposure and persistent infections.    EKGs/Labs/Other Studies Reviewed:    The following studies were reviewed today:   EKG:  The ekg ordered today demonstrates sinus rhythm, heart rate 96 bpm with T wave abnormalities suggesting lateral wall ischemia, compared to EKG done in 2016 2017 and November 2021 no significant change.   Transthoracic echocardiogram done November 2021IMPRESSIONS  1. Left ventricular ejection fraction, by estimation, is 20 to 25%. The left ventricle has severely decreased function. There is severe left ventricular hypertrophy. Left ventricular diastolic parameters are indeterminate. The average left ventricular global longitudinal strain is -6.7 %. The global longitudinal strain is  abnormal, with a  pattern of relative apical sparing. SEE RECOMMENDATIONS.  2. Right ventricular systolic function is moderately reduced. The right ventricular size is mildly enlarged. Moderately increased right ventricular wall thickness. There is severely elevated pulmonary artery systolic pressure. The estimated right  ventricular systolic pressure is 78.7 mmHg. 3. Left atrial size was moderately dilated.  4. Right atrial size was mild to moderately dilated.  5. The mitral valve is grossly normal. Severe mitral valve regurgitation.  6. Tricuspid valve regurgitation is severe.  7. The aortic valve is grossly normal. Aortic valve regurgitation is trivial.  8. The inferior vena cava is dilated in size with <50% respiratory variability, suggesting right atrial pressure of 15 mmHg.  9. A small pericardial effusion is present. The pericardial effusion is posterior to the left  ventricle.   Conclusion(s)/Recommendation(s): No intracardiac source of embolism detected on this transthoracic study. A transesophageal echocardiogram is recommended to exclude cardiac source of embolism if clinically indicated.  There is severe LVH with right ventricular hypertrophy and a global longitudinal strain pattern with relative apical sparing. Findings strongly suggestive of cardiac amyloidosis. Recommend further evaluation. No definite LV apical thrombus,  though Definity contrast was not given to fully exclude.   FINDINGS  Left Ventricle: Left ventricular ejection fraction, by estimation, is 20 to 25%. The left ventricle has severely decreased function. The left ventricle demonstrates global hypokinesis. The average left ventricular global longitudinal strain is -6.7 %.  The global longitudinal strain is abnormal. The left ventricular internal cavity size was normal in size. There is severe left ventricular hypertrophy. Left ventricular diastolic parameters are indeterminate.   Right Ventricle: The right ventricular size is mildly enlarged. Moderately increased right ventricular wall thickness. Right ventricular systolic function is moderatel reduced. There is severely elevated pulmonary artery systolic pressure. The tricuspid  regurgitant velocity is 3.99 m/s, and with an assumed right atrial pressure of 15 mmHg, the estimated right ventricular systolic pressure is 78.7 mmHg.   Left Atrium: Left atrial size was moderately dilated.   Right Atrium: Right atrial size was mild to moderately dilated.   Pericardium: A small pericardial effusion is present. The pericardial  effusion is posterior to the left ventricle.   Mitral Valve: The mitral valve is grossly normal. There is mild thickening  of the mitral valve leaflet(s). There is mild calcification of the mitral  valve leaflet(s). Severe mitral valve regurgitation.   Tricuspid Valve: The tricuspid valve is grossly normal.  Tricuspid valve  regurgitation is severe.   Aortic Valve: The aortic valve is grossly normal. Aortic valve  regurgitation is trivial.   Pulmonic Valve: The pulmonic valve was normal in structure. Pulmonic valve  regurgitation is mild.   Aorta: The aortic root is normal in size and structure.   Venous: The inferior vena cava is dilated in size with less than 50% respiratory variability, suggesting right atrial pressure of 15 mmHg. The  inferior vena cava and the hepatic vein show a pattern of systolic flow  reversal, suggestive of tricuspid  regurgitation.   IAS/Shunts: No atrial level shunt detected by color flow Doppler.   Recent Labs: 03/13/2020: TSH 2.668 03/24/2020: Hemoglobin 14.0; Platelets 298 03/26/2020: Magnesium 2.4 04/14/2020: ALT 43; BUN 47; Creatinine, Ser 4.31; Potassium 3.4; Sodium 133  Recent Lipid Panel    Component Value Date/Time   CHOL 80 03/12/2020 0500   TRIG 73 03/12/2020 0500   HDL 24 (L) 03/12/2020 0500   CHOLHDL 3.3 03/12/2020 0500   VLDL 15 03/12/2020 0500   LDLCALC 41 03/12/2020  0500    Physical Exam:    VS:  BP 110/70 (BP Location: Left Arm)   Pulse 96   Ht 5' (1.524 m)   Wt 96 lb 9.6 oz (43.8 kg)   SpO2 96%   BMI 18.87 kg/m     Wt Readings from Last 3 Encounters:  07/18/20 96 lb 9.6 oz (43.8 kg)  04/14/20 101 lb 12.8 oz (46.2 kg)  03/26/20 103 lb 9.9 oz (47 kg)     GEN: Well nourished, well developed in no acute distress HEENT: Normal NECK: No JVD; No carotid bruits LYMPHATICS: No lymphadenopathy CARDIAC: S1S2 noted,RRR, no murmurs, rubs, gallops RESPIRATORY:  Clear to auscultation without rales, wheezing or rhonchi  ABDOMEN: Soft, non-tender, non-distended, +bowel sounds, no guarding. EXTREMITIES: No edema, No cyanosis, no clubbing MUSCULOSKELETAL:  No deformity  SKIN: Warm and dry NEUROLOGIC:  Alert and oriented x 3, non-focal PSYCHIATRIC:  Normal affect, good insight  ASSESSMENT:    1. Heart failure with reduced ejection  fraction ([place])   2. History of CVA (cerebrovascular accident)   3. Nonischemic cardiomyopathy ([place])   4. Type 2 diabetes mellitus with complication, with long-term current use of insulin ([place])   5. Tobacco use   6. Mixed hyperlipidemia   7. Abnormal echocardiogram    PLAN:      I am not sure if she has insight of what is going on with her health at this time.  She appears to be euvolemic today.    Her EF is 25% since that the patient has been dealing with nonischemic cardiomyopathy going back to 2016.  Her recent echo is concerning for cardiac amyloidosis.  We certainly need to pursue further diagnostic testing for this. Given she also has severe pulmonary hypertension, I will hold off sending the patient for a PYP nuclear scan and referred her my heart failure colleagues. I defer to them for her diagnostic and treament process for her suspect cardiac amyloidosis and pulmonary hypertension.   For her nonischemic cardiomyopathy/reduced ejection fraction her blood pressure is impeding her from being on guideline directed medical therapy.  Her aide tells me that her blood pressure at the facility her systolics is usually in the 90s.  I am holding off on starting Entresto to avoid hypotension.  She should ultimately be on Entresto, Aldactone, low-dose beta-blocker and on SGLT 2 inhibitor.  We are going to closely follow the patient and gently add in medications.  She is on aspirin, Plavix and atorvastatin she is being followed by [facility] neurology since her recent stroke.  This is being managed by his primary care doctor.  No adjustments for antidiabetic medications were made today.  Hyperlipidemia - continue with current statin medication.  Blood work will be done today to check her electrolytes and kidney function.  The patient is in agreement with the above plan. The patient left the office in stable condition.  The patient will follow up in   Medication Adjustments/Labs and Tests  Ordered: Current medicines are reviewed at length with the patient today.  Concerns regarding medicines are outlined above.  Orders Placed This Encounter  Procedures  . Basic metabolic panel  . Magnesium  . AMB referral to CHF clinic  . EKG 12-Lead   No orders of the defined types were placed in this encounter.   Patient Instructions  Medication Instructions:  Your physician recommends that you continue on your current medications as directed. Please refer to the Current Medication list given to you today.  *If  you need a refill on your cardiac medications before your next appointment, please call your pharmacy*   Lab Work: Your physician recommends that you return for lab work:  TODAY: BMET, [place] If you have labs (blood work) drawn today and your tests are completely normal, you will receive your results only by: Marland Kitchen MyChart Message (if you have MyChart) OR . A paper copy in the mail If you have any lab test that is abnormal or we need to change your treatment, we will call you to review the results.   Testing/Procedures: None   Follow-Up: At [facility], you and your health needs are our priority.  As part of our continuing mission to provide you with exceptional heart care, we have created designated Provider Care Teams.  These Care Teams include your primary Cardiologist (physician) and Advanced Practice Providers (APPs -  Physician Assistants and Nurse Practitioners) who all work together to provide you with the care you need, when you need it.  We recommend signing up for the patient portal called "MyChart".  Sign up information is provided on this After Visit Summary.  MyChart is used to connect with patients for Virtual Visits (Telemedicine).  Patients are able to view lab/test results, encounter notes, upcoming appointments, etc.  Non-urgent messages can be sent to your provider as well.   To learn more about what you can do with MyChart, go to [email or web address].     Your next appointment:   3 month(s)  The format for your next appointment:   In Person  Provider:   Berniece Salines, DO   Other Instructions      Adopting a Healthy Lifestyle.  Know what a healthy weight is for you (roughly BMI <25) and aim to maintain this   Aim for 7+ servings of fruits and vegetables daily   65-80+ fluid ounces of water or unsweet tea for healthy kidneys   Limit to max 1 drink of alcohol per day; avoid smoking/tobacco   Limit animal fats in diet for cholesterol and heart health - choose grass fed whenever available   Avoid highly processed foods, and foods high in saturated/trans fats   Aim for low stress - take time to unwind and care for your mental health   Aim for 150 min of moderate intensity exercise weekly for heart health, and weights twice weekly for bone health   Aim for 7-9 hours of sleep daily   When it comes to diets, agreement about the perfect plan isnt easy to find, even among the experts. Experts at the [employer] developed an idea known as the Healthy Eating Plate. Just imagine a plate divided into logical, healthy portions.   The emphasis is on diet quality:   Load up on vegetables and fruits - one-half of your plate: Aim for color and variety, and remember that potatoes dont count.   Go for whole grains - one-quarter of your plate: Whole wheat, barley, wheat berries, quinoa, oats, brown rice, and foods made with them. If you want pasta, go with whole wheat pasta.   Protein power - one-quarter of your plate: Fish, chicken, beans, and nuts are all healthy, versatile protein sources. Limit red meat.   The diet, however, does go beyond the plate, offering a few other suggestions.   Use healthy plant oils, such as olive, canola, soy, corn, sunflower and peanut. Check the labels, and avoid partially hydrogenated oil, which have unhealthy trans fats.   If youre thirsty, drink water.  Coffee and tea are good in  moderation, but skip sugary drinks and limit milk and dairy products to one or two daily servings.   The type of carbohydrate in the diet is more important than the amount. Some sources of carbohydrates, such as vegetables, fruits, whole grains, and beans-are healthier than others.   Finally, stay active  Signed, Berniece Salines, DO  07/18/2020 12:11 PM    [facility]

## 2020-07-19 LAB — BASIC METABOLIC PANEL
BUN/Creatinine Ratio: 13 (ref 9–23)
BUN: 17 mg/dL (ref 6–24)
CO2: 19 mmol/L — ABNORMAL LOW (ref 20–29)
Calcium: 9.9 mg/dL (ref 8.7–10.2)
Chloride: 105 mmol/L (ref 96–106)
Creatinine, Ser: 1.29 mg/dL — ABNORMAL HIGH (ref 0.57–1.00)
Glucose: 153 mg/dL — ABNORMAL HIGH (ref 65–99)
Potassium: 4.6 mmol/L (ref 3.5–5.2)
Sodium: 143 mmol/L (ref 134–144)
eGFR: 48 mL/min/{1.73_m2} — ABNORMAL LOW (ref >59)

## 2020-07-19 LAB — MAGNESIUM: Magnesium: 1.6 mg/dL (ref 1.6–2.3)

## 2020-07-21 ENCOUNTER — Telehealth: Payer: Self-pay

## 2020-07-21 NOTE — Telephone Encounter (Signed)
-----   Message from Brian J Munley, MD sent at 07/19/2020 10:20 AM EST ----- Overall this is a good result her kidney function very abnormal 3 months ago is back to baseline her potassium and magnesium are good no changes. 

## 2020-07-21 NOTE — Telephone Encounter (Signed)
Tried calling patient. No answer and no voicemail set up for me to leave a message. 

## 2020-07-22 ENCOUNTER — Telehealth: Payer: Self-pay

## 2020-07-22 ENCOUNTER — Encounter (HOSPITAL_COMMUNITY): Payer: BC Managed Care – PPO

## 2020-07-22 NOTE — Telephone Encounter (Signed)
-----   Message from Brian J Munley, MD sent at 07/19/2020 10:20 AM EST ----- Overall this is a good result her kidney function very abnormal 3 months ago is back to baseline her potassium and magnesium are good no changes. 

## 2020-07-22 NOTE — Progress Notes (Incomplete)
Advanced Heart Failure Clinic Note   Referring Physician: PCP: Claiborne Billings, MD PCP-Cardiologist: Berniece Salines, DO  AHFC: Dr. Haroldine Laws   HPI: 60 y/o female w/ h/o tobacco abuse, IDDM, family h/o premature ischemic heart disease (mother w/ MI in her 53s), and chronic systolic heart failure, first diagnosed in 2016 when she was admitted to [facility] for acute CHF. Echo at that time showed reduced LVEF 30-35% w/ mod LVH, mild-mod MR and mod PH w/ PA pressure 60-65 mmHg. She had diagnostic LHC performed by Dr. Martinique which showed mild nonobstructive CAD (10-20% LAD and 20% LCx disease, RCA was small, nondominant w/ no significant coronary plaque). She was placed on medical therapy for systolic heart failure w/ an ACEi + ? blocker. She was seen by general cardiology for her post hospital f/u, however was lost to f/u thereafter.   She was recently admitted 11/2 for acute CVA w/ with left-sided weakness, impaired extraocular movement, right eyelid droop. CT of Head and Neck showed distal basilar artery stenosis as well as acute thalamic stroke.  As part of stroke w/u, 2D echo was perfomed and showed no intracardiac source of embolus. EF however was lower compared to study in 2016, now 20-25% w/ moderately reduced RV systolic function, severe LVH, severe MR and severe TR. Echo was suggestive of cardiac amyloidosis but PYP was negative for TTR amyloid.  cMRI not supportive of amyloid.  SPEP negative. Kappa light chains mildly elevated. Suspect non-specific. 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  During hospitalization, she developed cardiogenic shock and required milrinone and IV lasix. She appeared inotrope dependent but was not a candidate for home inotropes or advanced therapies due to other conformities. Also nursing facility dependent. Milrinone was weaned off. Co-ox remained ~50% off milrinone.   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  Unable to tolerate spiro or ARB/ARNI due to hyperkalemia. Bidil not well tolerated due to HAs. Now on hydralazine as monotherapy. B-blockers avoided due to recent shock. SGLT2is deferred due to frequent UTIs and poor perineal hygiene.   She was discharged to SNF on Lasix 40, Ivabradine 5 mg bid, hydralazine 12.5 tid and daily Lokelama to help w/ persistent hyperkalemia.   She presents to clinic today for f/u. Here w/ aid from SNF. In wheel chair, frail appearing. 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 Based on [facility] from SNF, she has been getting prescribed meds. BP soft today 90 systolic. Complains of abdominal pain, nausea and vomiting + Poor PO intake over the weekend. Looks dry on exam. Denies dyspnea.   Review of systems complete and found to be negative unless listed in HPI.      Past Medical History:  Diagnosis Date  . Abnormal LFTs    a. 05/2014 -  ALT 52, alk phos 130.  Marland Kitchen Chronic systolic CHF (congestive heart failure) ([place])    a. Dx 05/2014 - echo at [facility] - EF 30-35%, moderate LVH, no rWMA, mild to moderate MR, moderate PH with PA pressure 60-24mmHg, mild to moderate pericardial effusion. EF 30-35% by cath.  . History of blood transfusion 1986   "related to C-section"  . Hypertension   . Insulin dependent diabetes mellitus   . Iron deficiency anemia   . NICM (nonischemic cardiomyopathy) ([place])    a. LHC 06/11/14:  minor nonobstructive CAD with mild irregularities in the LAD less  than 10-20%, LCx with mild disease less than 20%, no significant disease in the RCA, EF 30-35%.  . Pericardial effusion    a. Mild-moderate pericardial effusion by echo at [facility].  . Pulmonary hypertension ([place])    a. Moderate PH by echo at [facility].  . Tobacco abuse     Current Outpatient Medications  Medication Sig Dispense Refill  . albuterol (VENTOLIN HFA) 108 (90 Base) MCG/ACT inhaler Inhale 2 puffs into the  lungs every 6 (six) hours as needed for wheezing or shortness of breath.    Marland Kitchen aspirin 81 MG EC tablet TAKE 1 TABLET (81 MG TOTAL) BY MOUTH DAILY. (Patient taking differently: Take 81 mg by mouth daily.) 30 tablet 3  . atorvastatin (LIPITOR) 40 MG tablet Take 1 tablet (40 mg total) by mouth every evening. 90 tablet 0  . clopidogrel (PLAVIX) 75 MG tablet Take 1 tablet (75 mg total) by mouth daily. 30 tablet 0  . hydrALAZINE (APRESOLINE) 25 MG tablet Take 0.5 tablets (12.5 mg total) by mouth every 8 (eight) hours. HOLD IF SBP <115 (Patient taking differently: Take 12.5 mg by mouth 2 (two) times daily. HOLD IF SBP <115) 90 tablet 0  . insulin glargine (LANTUS) 100 UNIT/ML injection Inject 0.18 mLs (18 Units total) into the skin at bedtime. 10 mL 11  . insulin lispro (HUMALOG) 100 UNIT/ML KwikPen Inject 5 Units into the skin 3 (three) times daily before meals.    . ivabradine (CORLANOR) 5 MG TABS tablet Take 1 tablet (5 mg total) by mouth 2 (two) times daily with a meal. 90 tablet 11  . metFORMIN (GLUCOPHAGE) 500 MG tablet Take 500 mg by mouth 2 (two) times daily.    . mirtazapine (REMERON SOL-TAB) 15 MG disintegrating tablet Take 0.5 tablets (7.5 mg total) by mouth at bedtime. 30 tablet 0  . ondansetron (ZOFRAN) 4 MG tablet Take by mouth.    . sodium zirconium cyclosilicate (LOKELMA) 10 g PACK packet Take 10 g by mouth 2 (two) times daily. 11 each 0  . traMADol (ULTRAM) 50 MG tablet Take 50 mg by mouth every 6 (six) hours as needed.     No current facility-administered medications for this visit.    No Known Allergies    Social History   Socioeconomic History  . Marital status: Divorced    Spouse name: Not on file  . Number of children: Not on file  . Years of education: Not on file  . Highest education level: Not on file  Occupational History  . Not on file  Tobacco Use  . Smoking status: Former Smoker    Packs/day: 0.50    Years: 22.00    Pack years: 11.00    Types: Cigarettes     Quit date: 03/26/2015    Years since quitting: 5.3  . Smokeless tobacco: Never Used  Substance and Sexual Activity  . Alcohol use: No  . Drug use: No  . Sexual activity: Yes  Other Topics Concern  . Not on file  Social History Narrative  . Not on file   Social Determinants of Health   Financial Resource Strain: Not on file  Food Insecurity: Not on file  Transportation Needs: Not on file  Physical Activity: Not on file  Stress: Not on file  Social Connections: Not on file  Intimate Partner Violence: Not on file      Family History  Problem Relation Age of Onset  . Coronary artery disease Mother   . Diabetes Mother   .  Lupus Mother   . Stroke Mother   . Lung cancer Father     There were no vitals filed for this visit.   PHYSICAL EXAM: General:  Thin/ frail appearing female in wheelchair, looks much older than actual age. No respiratory difficulty HEENT: hard of hearing, blind in left eye Neck: supple. no JVD. Carotids 2+ bilat; no bruits. No lymphadenopathy or thyromegaly appreciated. Cor: PMI nondisplaced. Regular rate & rhythm. No rubs, gallops or murmurs. Lungs: clear, no wheezing  Abdomen: soft, nontender, nondistended. No hepatosplenomegaly. No bruits or masses. Good bowel sounds. Extremities: thin extremities, no cyanosis, clubbing, rash, edema, legs warm  Neuro: alert & oriented x 3, cranial nerves grossly intact. moves all 4 extremities w/o difficulty. Affect pleasant.  ECG: not performed    ASSESSMENT & PLAN:  1. Chronic Systolic Heart Failure  - Echo 2016 EF 30-35%, RV moderately reduced. LHC showed mild nonobstructive CAD - Echo 11/21 EF 20-25%, RV severely reduced, severe LVH and thicken valves concerning for AL amyloid. Echo suggestive of cardiac amyloidosis but PYP was negative for TTR amyloid.  cMRI not supportive of amyloid.  SPEP negative. Kappa light chains mildly elevated. Suspect non-specific. - Suspect this may be burned out HTN  cardiomyopathy. - required milrinone recent admit but not candidate for home inotropes or advanced therapies.  - GDMT limited by persistently elevated K - Off spiro, Entresto and digoxin in setting of persistently elevated K+  - Unable to tolerate Bidil due to HAs.  - On hydralazine 12.5 mg tid. Will not titrate further given soft BP   - Continue corlanor 5 mg bid  - not a good candidate for SGLT2i due to frequent UTIs and poor perineal hygiene  - denies dyspnea and appears dry on exam today, BP low,  complains of abdominal pain, n/v and poor PO intake. May be due to ? Low output - Check CMP to assess SCr/ hepatic enzymes - hold lasix x 1 day, then reduce to alternating doses 40 mg one day/ 20 mg the next day.  - SNF given instructions to monitor wt. Can increase lasix back to 40 mg daily if volume status creeps up   2. Recent CVA 11/21  - CT of head and neck showedbasilar artery stenosis as well as acute thalamic stroke. -no intracardiac source of embolusnoted on echo and no afib on tele  - placed on ASA, Plavix and statin per neuro  - at SNF for rehab    3. IDDM  - on insulin   4. CAD - mild nonobstructive CAD by cath in 2016, w/10-20% LAD and 20% LCx disease, RCA was small, nondominant w/ no significant coronary plaque - denies CP  - continue medical management, ASA 81 mg, Plavix 75 mg, and Lipitor 40 mg  5.  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  - check BMP today     Lyda Jester, PA-C 07/22/20

## 2020-07-22 NOTE — Telephone Encounter (Signed)
Tried calling patient. No answer and no voicemail set up for me to leave a message. 

## 2020-07-23 ENCOUNTER — Telehealth: Payer: Self-pay

## 2020-07-23 NOTE — Telephone Encounter (Signed)
Letter sent to the patient at this time.

## 2020-07-23 NOTE — Telephone Encounter (Signed)
-----   Message from Baldo Daub, MD sent at 07/19/2020 10:20 AM EST ----- Overall this is a good result her kidney function very abnormal 3 months ago is back to baseline her potassium and magnesium are good no changes.

## 2020-07-23 NOTE — Telephone Encounter (Signed)
Tried calling patient. No answer and no voicemail set up for me to leave a message. 

## 2020-07-28 ENCOUNTER — Encounter: Payer: Self-pay | Admitting: Adult Health

## 2020-07-28 ENCOUNTER — Ambulatory Visit (INDEPENDENT_AMBULATORY_CARE_PROVIDER_SITE_OTHER): Payer: BC Managed Care – PPO | Admitting: Adult Health

## 2020-07-28 VITALS — BP 107/62 | HR 89

## 2020-07-28 DIAGNOSIS — I639 Cerebral infarction, unspecified: Secondary | ICD-10-CM

## 2020-07-28 DIAGNOSIS — I651 Occlusion and stenosis of basilar artery: Secondary | ICD-10-CM | POA: Diagnosis not present

## 2020-07-28 NOTE — Progress Notes (Signed)
[facility] [address] [place]. [place] [address] [phone]       STROKE FOLLOW UP NOTE  Christine Benitez Date of Birth:  12/28/1960 Medical Record Number:  [id number]   Reason for Referral: stroke follow up    SUBJECTIVE:   CHIEF COMPLAINT:  Chief Complaint  Patient presents with  . Follow-up    TR with CNA  PT is doing ok per CNA    HPI:   Today, 07/28/2020, Christine Benitez returns for 49-month stroke follow-up.  Continues to reside at [facility] and accompanied by facility aide, Minette Brine, who is very familiar with Christine Benitez even prior to her stroke  Reports improvement of left-sided deficits.  Not currently working with therapies. Ambulates short distance with RW - eager to return home but per aide, they are concerned regarding unsafe discharge as she does not have adequate family support at home Denies new or worsening stroke/TIA symptoms  No specific complaints today Review of facility MAR, continues on aspirin, Plavix and atorvastatin Blood pressure today 107/62 on hydralazine per facility  Glucose levels stable per aide on Lantus, Humalog and Metformin per facility   She has had follow-up with cardiology Dr. Harriet Masson Has not been contacted yet by IR Dr. Estanislado Pandy for evaluation of BA stenosis     History provided for reference purposes only Initial visit 04/28/2020 JM: Christine Benitez is being seen for hospital follow-up accompanied by facility aide.  Currently residing at [facility] rehab.  Reports residual mild left-sided weakness, dysarthria and gait impairment.  Does report improvement and currently working with PT/OT/SLP.  Denies new or worsening stroke/TIA symptoms.  Per facility [facility], she has remained on aspirin 81 mg daily, Plavix and atorvastatin 40 mg daily.  Blood pressure today 102/69.  Blood pressures monitored at facility and she believes these have been stable.  Glucose levels monitored at facility and she believes  these have been stable.  No concerns at this time.  Stroke admission 03/11/2020 Christine Benitez is a 60 y.o. female with history of of CHF, HTN, IDDM, iron deficiency anemia, NICM, CAD, pulmonary hypertension and smoking found down, who presented on 03/11/2020 with lethargy and L sided weakness .  Personally reviewed hospitalization pertinent progress notes, lab work and imaging with summary provided.  Initially evaluated by Dr. Erlinda Hong with stroke work-up revealing anterior right thalamic infarct and midbrain infarcts, secondary to small vs large vessel disease source although cardioembolic source cannot be completely excluded in setting of low EF.  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  Also recommended 30-day cardiac event monitor to rule out A. fib.  Placed on DAPT for secondary stroke prevention.  HTN stable.  LDL 41 and increase atorvastatin from 20 mg to 40 mg daily.  Uncontrolled DM with A1c 7.3.  Other stroke risk factors include prior strokes on imaging, former tobacco use, family history of stroke, CAD on medical management.  Residual deficits of cognitive impairment, mild to moderate dysarthria, left facial droop, and left hemiparesis.  Evaluated by therapies and discharged to SNF for ongoing therapy needs.  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 large vessel disease source.  Cardioembolic source cannot be completely excluded due to low EF.  CT head R thalamic hypodensity. Old L parietal cortial and subcortical infarct.   MRI  Anterior R thalamic into parasagittal R midbrain infarct  CTA head & neck Limited eval d/t bolus timing - severe distal BA stenosis, B PCA narrowing and possible proximal R PComA significant stenosis, possible R cavernous ICA stenosis. R>L pleural effusions.    Plan reassess for cerebral angiogram when medically more stable to evaluate basilar  artery stenosis  2D Echo EF 20 to 25%.  LA moderately dilated.  LDL 41  HgbA1c 7.3  VTE prophylaxis - Lovenox 40 mg sq daily   aspirin 81 mg daily prior to admission, now on aspirin 325 mg daily and clopidogrel 75 mg daily.   Therapy recommendations:  SNF  Disposition:   SNF    ROS:   14 system review of systems performed and negative with exception of those listed in HPI  PMH:  Past Medical History:  Diagnosis Date  . Abnormal LFTs    a. 05/2014 -  ALT 52, alk phos 130.  Marland Kitchen Chronic systolic CHF (congestive heart failure) ([place])    a. Dx 05/2014 - echo at [facility] - EF 30-35%, moderate LVH, no rWMA, mild to moderate MR, moderate PH with PA pressure 60-46mmHg, mild to moderate pericardial effusion. EF 30-35% by cath.  . History of blood transfusion 1986   "related to C-section"  . Hypertension   . Insulin dependent diabetes mellitus   . Iron deficiency anemia   . NICM (nonischemic cardiomyopathy) ([place])    a. [place] 06/11/14:  minor nonobstructive CAD with mild irregularities in the LAD less than 10-20%, LCx with mild disease less than 20%, no significant disease in the RCA, EF 30-35%.  . Pericardial effusion    a. Mild-moderate pericardial effusion by echo at [facility].  . Pulmonary hypertension ([place])    a. Moderate PH by echo at [facility].  . Tobacco abuse     PSH:  Past Surgical History:  Procedure Laterality Date  . APPENDECTOMY  ~ 2003  . CESAREAN SECTION  1986  . LEFT HEART CATHETERIZATION WITH CORONARY ANGIOGRAM N/A 06/11/2014   Procedure: LEFT HEART CATHETERIZATION WITH CORONARY ANGIOGRAM;  Surgeon: Peter M Martinique, MD;  Location: [facility] CATH LAB;  Service: Cardiovascular;  Laterality: N/A;    Social History:  Social History   Socioeconomic History  . Marital status: Divorced    Spouse name: Not on file  . Number of children: Not on file  . Years of education: Not on file  . Highest education level: Not on file  Occupational History  . Not on file  Tobacco Use  .  Smoking status: Former Smoker    Packs/day: 0.50    Years: 22.00    Pack years: 11.00    Types: Cigarettes    Quit date: 03/26/2015    Years since quitting: 5.3  . Smokeless tobacco: Never Used  Substance and Sexual Activity  . Alcohol use: No  . Drug use: No  . Sexual activity: Yes  Other Topics Concern  . Not on file  Social History Narrative  . Not on file   Social Determinants of Health   Financial Resource Strain: Not on file  Food Insecurity: Not on file  Transportation Needs: Not on file  Physical Activity: Not on file  Stress: Not on file  Social Connections: Not on file  Intimate Partner Violence: Not on file    Family History:  Family History  Problem Relation Age of Onset  . Coronary artery disease Mother   . Diabetes Mother   . Lupus Mother   . Stroke Mother   . Lung cancer Father  Medications:   Current Outpatient Medications on File Prior to Visit  Medication Sig Dispense Refill  . albuterol (VENTOLIN HFA) 108 (90 Base) MCG/ACT inhaler Inhale 2 puffs into the lungs every 6 (six) hours as needed for wheezing or shortness of breath.    Marland Kitchen aspirin 81 MG EC tablet TAKE 1 TABLET (81 MG TOTAL) BY MOUTH DAILY. (Patient taking differently: Take 81 mg by mouth daily.) 30 tablet 3  . atorvastatin (LIPITOR) 40 MG tablet Take 1 tablet (40 mg total) by mouth every evening. 90 tablet 0  . clopidogrel (PLAVIX) 75 MG tablet Take 1 tablet (75 mg total) by mouth daily. 30 tablet 0  . hydrALAZINE (APRESOLINE) 25 MG tablet Take 0.5 tablets (12.5 mg total) by mouth every 8 (eight) hours. HOLD IF SBP <115 (Patient taking differently: Take 12.5 mg by mouth 2 (two) times daily. HOLD IF SBP <115) 90 tablet 0  . insulin glargine (LANTUS) 100 UNIT/ML injection Inject 0.18 mLs (18 Units total) into the skin at bedtime. 10 mL 11  . insulin lispro (HUMALOG) 100 UNIT/ML KwikPen Inject 5 Units into the skin 3 (three) times daily before meals.    . ivabradine (CORLANOR) 5 MG TABS  tablet Take 1 tablet (5 mg total) by mouth 2 (two) times daily with a meal. 90 tablet 11  . metFORMIN (GLUCOPHAGE) 500 MG tablet Take 500 mg by mouth 2 (two) times daily.    . mirtazapine (REMERON SOL-TAB) 15 MG disintegrating tablet Take 0.5 tablets (7.5 mg total) by mouth at bedtime. 30 tablet 0  . ondansetron (ZOFRAN) 4 MG tablet Take by mouth.    . sodium zirconium cyclosilicate (LOKELMA) 10 g PACK packet Take 10 g by mouth 2 (two) times daily. 11 each 0  . traMADol (ULTRAM) 50 MG tablet Take 50 mg by mouth every 6 (six) hours as needed.     No current facility-administered medications on file prior to visit.    Allergies:  No Known Allergies    OBJECTIVE:  Physical Exam  Vitals:   07/28/20 1318  BP: 107/62  Pulse: 89   There is no height or weight on file to calculate BMI. No exam data present  General: Frail very pleasant appearing older than stated age African-American female, seated, in no evident distress Head: head normocephalic and atraumatic.   Neck: supple with no carotid or supraclavicular bruits Cardiovascular: regular rate and rhythm, no murmurs Musculoskeletal: no deformity Skin:  no rash/petichiae Vascular:  Normal pulses all extremities   Neurologic Exam Mental Status: Awake and fully alert. Mild dysarthria. Oriented to place and time. Recent and remote memory intact during visit. Attention span, concentration and fund of knowledge appears appropriate during visit. Mood and affect appropriate.  Cranial Nerves: R Pupil briskly reactive to light.  Chronic left eye blindness.  Extraocular movements full without nystagmus. Visual fields full to confrontation.  Severe HOH bilaterally.  Facial sensation intact.  Mild left nasolabial fold flattening.  Tongue, palate moves normally and symmetrically.  Motor: Normal bulk and tone.  Full strength in all tested extremities except slightly decreased left hand grip strength Sensory.: intact to touch , pinprick , position  and vibratory sensation.  Coordination: Rapid alternating movements normal in all extremities except decreased left hand. Finger-to-nose and heel-to-shin performed accurately bilaterally. Gait and Station: Deferred as rolling walker not present during visit Reflexes: 1+ and symmetric. Toes downgoing.         ASSESSMENT: Christine Benitez is a 60 y.o. year old female presented  with lethargy and left-sided weakness on 03/11/2020 with stroke work-up revealing anterior right thalamic infarct and midbrain infarcts, secondary to small vs large vessel disease although cardioembolic source cannot be completely excluded due to low EF. Vascular risk factors include basilar artery stenosis, cardiomyopathy, acute on chronic systolic CHF with cardiogenic shock 11/5, HTN, HLD, DM, AKI on CKD, former tobacco use, family history of stroke, CAD and prior strokes on imaging.      PLAN:  1. R thalamic and midbrain strokes :  a. Residual deficit: Slightly weak left hand strength, gait impairment and mild dysarthria.  Apparently completed working with therapies at SNF -she is eager to return home but per aide, SNF has concerns regarding safety as she does not have adequate support.  This was discussed with patient b. Continue aspirin 81 mg daily and clopidogrel 75 mg daily  and atorvastatin for secondary stroke prevention and hx of CAD.   c. Discussed secondary stroke prevention measures and importance of close PCP follow up for aggressive stroke risk factor management  2. Basilar artery stenosis: reached out to our referrals department who contacted Dr. Arlean Hopping office and spoke to [place] -she plans on calling today to schedule visit.  3. HTN: BP goal <130/90.  Stable on low side today currently on hydralazine per cards 4. HLD: LDL goal <70. Recent LDL 41.  On atorvastatin 40 mg daily per facility 5. DMII: A1c goal<7.0. Recent A1c 7.3.  Currently monitored by facility 6. Extensive cardiac history: Routine  follow-up with cardiology.  Personally reviewed recent cardiology follow-up note.  Per cardiology, continues DAPT as well as statin usage for hx of CAD.     Follow up in 6 months months or call earlier if needed   CC:  [place] provider: Dr. Cleaster Corin, Jenny Reichmann, MD    I spent 30 minutes of face-to-face and non-face-to-face time with patient and SNF aide.  This included previsit chart review, lab review, study review, order entry, electronic health record documentation, patient education regarding priro stroke and etiology, residual deficits, importance of managing stroke risk factors, evaluation with Dr. Estanislado Pandy and answered all questions to patient and aides satisfaction   Frann Rider, [facility]  [facility] [address] [place], [place] [address]  Phone [phone] Fax [phone] Note: This document was prepared with digital dictation and possible smart phrase technology. Any transcriptional errors that result from this process are unintentional.

## 2020-07-28 NOTE — Patient Instructions (Signed)
Continue aspirin 81 mg daily and clopidogrel 75 mg daily  and atorvastatin  for secondary stroke prevention  Will follow up on referral to Dr. Corliss Skains regarding basilar artery stenosis  Continue to follow up with PCP regarding cholesterol, blood pressure and diabetes management  Maintain strict control of hypertension with blood pressure goal below 130/90, diabetes with hemoglobin A1c goal below 7% and cholesterol with LDL cholesterol (bad cholesterol) goal below 70 mg/dL.      Followup in the future with me in 6 months or call earlier if needed     Thank you for coming to see [place] at [facility]. I hope we have been able to provide you high quality care today.  You may receive a patient satisfaction survey over the next few weeks. We would appreciate your feedback and comments so that we may continue to improve ourselves and the health of our patients.

## 2020-07-29 ENCOUNTER — Other Ambulatory Visit (HOSPITAL_COMMUNITY): Payer: Self-pay | Admitting: Interventional Radiology

## 2020-07-29 ENCOUNTER — Telehealth (HOSPITAL_COMMUNITY): Payer: Self-pay

## 2020-07-29 DIAGNOSIS — I651 Occlusion and stenosis of basilar artery: Secondary | ICD-10-CM

## 2020-07-29 NOTE — Progress Notes (Signed)
I agree with the above plan 

## 2020-07-29 NOTE — Telephone Encounter (Signed)
Called to schedule consult, no answer, left vm. AW  

## 2020-08-05 DIAGNOSIS — I509 Heart failure, unspecified: Secondary | ICD-10-CM | POA: Diagnosis not present

## 2020-08-05 DIAGNOSIS — E119 Type 2 diabetes mellitus without complications: Secondary | ICD-10-CM | POA: Diagnosis not present

## 2020-08-05 DIAGNOSIS — I639 Cerebral infarction, unspecified: Secondary | ICD-10-CM | POA: Diagnosis not present

## 2020-10-02 DIAGNOSIS — D509 Iron deficiency anemia, unspecified: Secondary | ICD-10-CM | POA: Insufficient documentation

## 2020-10-02 DIAGNOSIS — I5022 Chronic systolic (congestive) heart failure: Secondary | ICD-10-CM | POA: Insufficient documentation

## 2020-10-02 DIAGNOSIS — I3139 Other pericardial effusion (noninflammatory): Secondary | ICD-10-CM | POA: Insufficient documentation

## 2020-10-02 DIAGNOSIS — I313 Pericardial effusion (noninflammatory): Secondary | ICD-10-CM | POA: Insufficient documentation

## 2020-10-08 ENCOUNTER — Ambulatory Visit (INDEPENDENT_AMBULATORY_CARE_PROVIDER_SITE_OTHER): Payer: BC Managed Care – PPO | Admitting: Cardiology

## 2020-10-08 ENCOUNTER — Encounter: Payer: Self-pay | Admitting: Cardiology

## 2020-10-08 ENCOUNTER — Other Ambulatory Visit: Payer: Self-pay

## 2020-10-08 VITALS — BP 100/60 | HR 90 | Ht 60.0 in | Wt 98.6 lb

## 2020-10-08 DIAGNOSIS — E782 Mixed hyperlipidemia: Secondary | ICD-10-CM

## 2020-10-08 DIAGNOSIS — I5022 Chronic systolic (congestive) heart failure: Secondary | ICD-10-CM

## 2020-10-08 DIAGNOSIS — Z8673 Personal history of transient ischemic attack (TIA), and cerebral infarction without residual deficits: Secondary | ICD-10-CM

## 2020-10-08 DIAGNOSIS — Z794 Long term (current) use of insulin: Secondary | ICD-10-CM

## 2020-10-08 DIAGNOSIS — I5020 Unspecified systolic (congestive) heart failure: Secondary | ICD-10-CM

## 2020-10-08 DIAGNOSIS — I428 Other cardiomyopathies: Secondary | ICD-10-CM

## 2020-10-08 DIAGNOSIS — I272 Pulmonary hypertension, unspecified: Secondary | ICD-10-CM

## 2020-10-08 DIAGNOSIS — E118 Type 2 diabetes mellitus with unspecified complications: Secondary | ICD-10-CM

## 2020-10-08 MED ORDER — METOPROLOL SUCCINATE ER 25 MG PO TB24: 12.5000 mg | ORAL_TABLET | Freq: Every day | ORAL | 3 refills | Status: DC

## 2020-10-08 NOTE — Patient Instructions (Addendum)
Medication Instructions:  Your physician has recommended you make the following change in your medication: STOP: Hydralazine (Apresoline) STOP: Ivabradine START: Toprol-XL 12.5 mg once daily *If you need a refill on your cardiac medications before your next appointment, please call your pharmacy*   Lab Work: None If you have labs (blood work) drawn today and your tests are completely normal, you will receive your results only by: Marland Kitchen MyChart Message (if you have MyChart) OR . A paper copy in the mail If you have any lab test that is abnormal or we need to change your treatment, we will call you to review the results.   Testing/Procedures: None   Follow-Up: At [facility], you and your health needs are our priority.  As part of our continuing mission to provide you with exceptional heart care, we have created designated Provider Care Teams.  These Care Teams include your primary Cardiologist (physician) and Advanced Practice Providers (APPs -  Physician Assistants and Nurse Practitioners) who all work together to provide you with the care you need, when you need it.  We recommend signing up for the patient portal called "MyChart".  Sign up information is provided on this After Visit Summary.  MyChart is used to connect with patients for Virtual Visits (Telemedicine).  Patients are able to view lab/test results, encounter notes, upcoming appointments, etc.  Non-urgent messages can be sent to your provider as well.   To learn more about what you can do with MyChart, go to [email or web address].    Your next appointment:   12 week(s)  The format for your next appointment:   In Person  Provider:   Thomasene Ripple, DO   Other Instructions

## 2020-10-08 NOTE — Progress Notes (Signed)
Cardiology Office Note:    Date:  10/08/2020   ID:  Christine Benitez, DOB [birth year], MRN [id number]  PCP:  Claiborne Billings, MD  Cardiologist:  Berniece Salines, DO  Electrophysiologist:  None   Referring MD: Claiborne Billings, MD   " I am fine  History of Present Illness:    Christine Benitez is a 60 y.o. female with a hx of of chronic systolic heart failure EF 20 to 25% on her recent echocardiogram which was done in November 2021 per her charts and information from care everywhere the patient did have a heart catheterization in February 2016 which show nonischemic cardiomyopathy minor nonobstructive CAD with mild irregularities, minimal nonobstructive coronary artery disease, insulin-dependent diabetes, iron deficiency anemia, nonischemic cardiomyopathy, pulmonary hypertension, history of recent stroke and smoking. At his last visit she was referred to the advanced heart failure clinic but unfortunately she has not made any appointment yet. Since her last visit she has been started on hydralazine and ivabradine.  She has not had the opportunity to start low-dose beta-blocker and there is no contraindication.  Past Medical History:  Diagnosis Date  . Abnormal LFTs    a. 05/2014 -  ALT 52, alk phos 130.  Marland Kitchen Chronic systolic CHF (congestive heart failure) ([place])    a. Dx 05/2014 - echo at [facility] - EF 30-35%, moderate LVH, no rWMA, mild to moderate MR, moderate PH with PA pressure 60-51mmHg, mild to moderate pericardial effusion. EF 30-35% by cath.  . History of blood transfusion 1986   "related to C-section"  . Hypertension   . Insulin dependent diabetes mellitus   . Iron deficiency anemia   . NICM (nonischemic cardiomyopathy) ([place])    a. [place] 06/11/14:  minor nonobstructive CAD with mild irregularities in the LAD less than 10-20%, LCx with mild disease less than 20%, no significant disease in the RCA, EF 30-35%.  . Pericardial effusion    a. Mild-moderate pericardial effusion by echo at [facility].  .  Pulmonary hypertension ([place])    a. Moderate PH by echo at [facility].  . Tobacco abuse     Past Surgical History:  Procedure Laterality Date  . APPENDECTOMY  ~ 2003  . CESAREAN SECTION  1986  . LEFT HEART CATHETERIZATION WITH CORONARY ANGIOGRAM N/A 06/11/2014   Procedure: LEFT HEART CATHETERIZATION WITH CORONARY ANGIOGRAM;  Surgeon: Peter M Martinique, MD;  Location: [facility] CATH LAB;  Service: Cardiovascular;  Laterality: N/A;    Current Medications: Current Meds  Medication Sig  . albuterol (VENTOLIN HFA) 108 (90 Base) MCG/ACT inhaler Inhale 2 puffs into the lungs every 6 (six) hours as needed for wheezing or shortness of breath.  Marland Kitchen aspirin 81 MG EC tablet TAKE 1 TABLET (81 MG TOTAL) BY MOUTH DAILY.  Marland Kitchen atorvastatin (LIPITOR) 40 MG tablet Take 1 tablet (40 mg total) by mouth every evening.  . clopidogrel (PLAVIX) 75 MG tablet Take 1 tablet (75 mg total) by mouth daily.  . hydrALAZINE (APRESOLINE) 25 MG tablet Take 0.5 tablets (12.5 mg total) by mouth every 8 (eight) hours. HOLD IF SBP <115 (Patient taking differently: Take 12.5 mg by mouth 2 (two) times daily. HOLD IF SBP <115)  . insulin glargine (LANTUS) 100 UNIT/ML injection Inject 0.18 mLs (18 Units total) into the skin at bedtime.  . insulin lispro (HUMALOG) 100 UNIT/ML KwikPen Inject 5 Units into the skin 3 (three) times daily before meals.  . ivabradine (CORLANOR) 5 MG TABS tablet TAKE 1 TABLET (5 MG TOTAL) BY MOUTH 2 (TWO)  TIMES DAILY WITH A MEAL.  . metFORMIN (GLUCOPHAGE) 500 MG tablet Take 500 mg by mouth 2 (two) times daily.  . metoprolol succinate (TOPROL XL) 25 MG 24 hr tablet Take 0.5 tablets (12.5 mg total) by mouth daily.  . mirtazapine (REMERON SOL-TAB) 15 MG disintegrating tablet Take 0.5 tablets (7.5 mg total) by mouth at bedtime.  . ondansetron (ZOFRAN) 4 MG tablet Take by mouth.  . sodium zirconium cyclosilicate (LOKELMA) 10 g PACK packet Take 10 g by mouth 2 (two) times daily.  . traMADol (ULTRAM) 50 MG tablet Take 50 mg by  mouth every 6 (six) hours as needed.     Allergies:   Patient has no known allergies.   Social History   Socioeconomic History  . Marital status: Divorced    Spouse name: Not on file  . Number of children: Not on file  . Years of education: Not on file  . Highest education level: Not on file  Occupational History  . Not on file  Tobacco Use  . Smoking status: Former Smoker    Packs/day: 0.50    Years: 22.00    Pack years: 11.00    Types: Cigarettes    Quit date: 03/26/2015    Years since quitting: 5.5  . Smokeless tobacco: Never Used  Substance and Sexual Activity  . Alcohol use: No  . Drug use: No  . Sexual activity: Yes  Other Topics Concern  . Not on file  Social History Narrative  . Not on file   Social Determinants of Health   Financial Resource Strain: Not on file  Food Insecurity: Not on file  Transportation Needs: Not on file  Physical Activity: Not on file  Stress: Not on file  Social Connections: Not on file     Family History: The patient's family history includes Coronary artery disease in her mother; Diabetes in her mother; Lung cancer in her father; Lupus in her mother; Stroke in her mother.  ROS:   Review of Systems  Constitution: Negative for decreased appetite, fever and weight gain.  HENT: Negative for congestion, ear discharge, hoarse voice and sore throat.   Eyes: Negative for discharge, redness, vision loss in right eye and visual halos.  Cardiovascular: Negative for chest pain, dyspnea on exertion, leg swelling, orthopnea and palpitations.  Respiratory: Negative for cough, hemoptysis, shortness of breath and snoring.   Endocrine: Negative for heat intolerance and polyphagia.  Hematologic/Lymphatic: Negative for bleeding problem. Does not bruise/bleed easily.  Skin: Negative for flushing, nail changes, rash and suspicious lesions.  Musculoskeletal: Negative for arthritis, joint pain, muscle cramps, myalgias, neck pain and stiffness.   Gastrointestinal: Negative for abdominal pain, bowel incontinence, diarrhea and excessive appetite.  Genitourinary: Negative for decreased libido, genital sores and incomplete emptying.  Neurological: Negative for brief paralysis, focal weakness, headaches and loss of balance.  Psychiatric/Behavioral: Negative for altered mental status, depression and suicidal ideas.  Allergic/Immunologic: Negative for HIV exposure and persistent infections.    EKGs/Labs/Other Studies Reviewed:    The following studies were reviewed today:   EKG: None today  Transthoracic echocardiogram done November 2021IMPRESSIONS  1. Left ventricular ejection fraction, by estimation, is 20 to 25%. The left ventricle has severely decreased function. There is severe left ventricular hypertrophy. Left ventricular diastolic parameters are indeterminate. The average left ventricular global longitudinal strain is -6.7 %. The global longitudinal strain is  abnormal, with a pattern of relative apical sparing. SEE RECOMMENDATIONS.  2. Right ventricular systolic function is moderately reduced. The  right ventricular size is mildly enlarged. Moderately increased right ventricular wall thickness. There is severely elevated pulmonary artery systolic pressure. The estimated right  ventricular systolic pressure is 90.2 mmHg. 3. Left atrial size was moderately dilated.  4. Right atrial size was mild to moderately dilated.  5. The mitral valve is grossly normal. Severe mitral valve regurgitation.  6. Tricuspid valve regurgitation is severe.  7. The aortic valve is grossly normal. Aortic valve regurgitation is trivial.  8. The inferior vena cava is dilated in size with <50% respiratory variability, suggesting right atrial pressure of 15 mmHg.  9. A small pericardial effusion is present. The pericardial effusion is posterior to the left ventricle.   Conclusion(s)/Recommendation(s): No intracardiac source of embolism detected on  this transthoracic study. A transesophageal echocardiogram is recommended to exclude cardiac source of embolism if clinically indicated.  There is severe LVH with right ventricular hypertrophy and a global longitudinal strain pattern with relative apical sparing. Findings strongly suggestive of cardiac amyloidosis. Recommend further evaluation. No definite LV apical thrombus,  though Definity contrast was not given to fully exclude.   FINDINGS  Left Ventricle: Left ventricular ejection fraction, by estimation, is 20 to 25%. The left ventricle has severely decreased function. The left ventricle demonstrates global hypokinesis. The average left ventricular global longitudinal strain is -6.7 %.  The global longitudinal strain is abnormal. The left ventricular internal cavity size was normal in size. There is severe left ventricular hypertrophy. Left ventricular diastolic parameters are indeterminate.   Right Ventricle: The right ventricular size is mildly enlarged. Moderately increased right ventricular wall thickness. Right ventricular systolic function is moderatel reduced. There is severely elevated pulmonary artery systolic pressure. The tricuspid  regurgitant velocity is 3.99 m/s, and with an assumed right atrial pressure of 15 mmHg, the estimated right ventricular systolic pressure is 40.9 mmHg.   Left Atrium: Left atrial size was moderately dilated.   Right Atrium: Right atrial size was mild to moderately dilated.   Pericardium: A small pericardial effusion is present. The pericardial  effusion is posterior to the left ventricle.   Mitral Valve: The mitral valve is grossly normal. There is mild thickening  of the mitral valve leaflet(s). There is mild calcification of the mitral  valve leaflet(s). Severe mitral valve regurgitation.   Tricuspid Valve: The tricuspid valve is grossly normal. Tricuspid valve  regurgitation is severe.   Aortic Valve: The aortic valve is grossly normal.  Aortic valve  regurgitation is trivial.   Pulmonic Valve: The pulmonic valve was normal in structure. Pulmonic valve  regurgitation is mild.   Aorta: The aortic root is normal in size and structure.   Venous: The inferior vena cava is dilated in size with less than 50% respiratory variability, suggesting right atrial pressure of 15 mmHg. The  inferior vena cava and the hepatic vein show a pattern of systolic flow  reversal, suggestive of tricuspid  regurgitation.   IAS/Shunts: No atrial level shunt detected by color flow Doppler.   Recent Labs: 03/13/2020: TSH 2.668 03/24/2020: Hemoglobin 14.0; Platelets 298 04/14/2020: ALT 43 07/18/2020: BUN 17; Creatinine, Ser 1.29; Magnesium 1.6; Potassium 4.6; Sodium 143  Recent Lipid Panel    Component Value Date/Time   CHOL 80 03/12/2020 0500   TRIG 73 03/12/2020 0500   HDL 24 (L) 03/12/2020 0500   CHOLHDL 3.3 03/12/2020 0500   VLDL 15 03/12/2020 0500   LDLCALC 41 03/12/2020 0500    Physical Exam:    VS:  BP 100/60   Pulse 90  Ht 5' (1.524 m)   Wt 98 lb 9.6 oz (44.7 kg)   SpO2 95%   BMI 19.26 kg/m     Wt Readings from Last 3 Encounters:  10/08/20 98 lb 9.6 oz (44.7 kg)  07/18/20 96 lb 9.6 oz (43.8 kg)  04/14/20 101 lb 12.8 oz (46.2 kg)     GEN: Well nourished, well developed in no acute distress HEENT: Normal NECK: No JVD; No carotid bruits LYMPHATICS: No lymphadenopathy CARDIAC: S1S2 noted,RRR, no murmurs, rubs, gallops RESPIRATORY:  Clear to auscultation without rales, wheezing or rhonchi  ABDOMEN: Soft, non-tender, non-distended, +bowel sounds, no guarding. EXTREMITIES: No edema, No cyanosis, no clubbing MUSCULOSKELETAL:  No deformity  SKIN: Warm and dry NEUROLOGIC:  Alert and oriented x 3, non-focal PSYCHIATRIC:  Normal affect, good insight  ASSESSMENT:    1. NICM (nonischemic cardiomyopathy) ([place])   2. Pulmonary hypertension (HCC)   3. Heart failure with reduced ejection fraction ([place])   4. Chronic systolic  CHF (congestive heart failure) ([place])   5. Type 2 diabetes mellitus with complication, with long-term current use of insulin ([place])   6. History of CVA (cerebrovascular accident)   7. Mixed hyperlipidemia    PLAN:     With I had an opportunity to start the patient on low-dose beta-blocker, or any guideline directed medication.  She was started on hydralazine as well as her ivabradine-although these medications can be used in her current state but it would be ideal to start with using low-dose beta-blocker, Entresto, MRA and SGLT2 inhibitors.  So for this we will stop the hydralazine any ivabradine and started patient on low-dose Toprol-XL 12.5 mg.  The goal is to add the additional medication regimen as described above.  We will set her up for again to our advanced heart failure clinic.  She had a heart catheterization in 2016 when her issues with cardiomyopathy started at that time her EF was also 25% her catheterization once essentially normal reported nonischemic cardiomyopathy.  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  Given that I am setting the patient up with our advanced heart failure clinic I will hold off on sending the patient for PYP nuclear scan and defer to my heart failure colleagues for proper work-up.  For history of CVA she will continue her aspirin, Plavix as well as atorvastatin.  She follows with [facility] neurology.  Hyperlipidemia - continue with current statin medication.  The patient is in agreement with the above plan. The patient left the office in stable condition.  The patient will follow up in   Medication Adjustments/Labs and Tests Ordered: Current medicines are reviewed at length with the patient today.  Concerns regarding medicines are outlined above.  No orders of the defined types were placed in this encounter.  Meds ordered this encounter  Medications  . metoprolol succinate (TOPROL XL) 25 MG 24 hr  tablet    Sig: Take 0.5 tablets (12.5 mg total) by mouth daily.    Dispense:  45 tablet    Refill:  3    Patient Instructions  Medication Instructions:  Your physician has recommended you make the following change in your medication: STOP: Hydralazine (Apresoline) START: Toprol-XL 12.5 mg once daily *If you need a refill on your cardiac medications before your next appointment, please call your pharmacy*   Lab Work: None If you have labs (blood work) drawn today and your tests are completely normal, you will receive your results only by: Marland Kitchen MyChart Message (  if you have MyChart) OR . A paper copy in the mail If you have any lab test that is abnormal or we need to change your treatment, we will call you to review the results.   Testing/Procedures: None   Follow-Up: At [facility], you and your health needs are our priority.  As part of our continuing mission to provide you with exceptional heart care, we have created designated Provider Care Teams.  These Care Teams include your primary Cardiologist (physician) and Advanced Practice Providers (APPs -  Physician Assistants and Nurse Practitioners) who all work together to provide you with the care you need, when you need it.  We recommend signing up for the patient portal called "MyChart".  Sign up information is provided on this After Visit Summary.  MyChart is used to connect with patients for Virtual Visits (Telemedicine).  Patients are able to view lab/test results, encounter notes, upcoming appointments, etc.  Non-urgent messages can be sent to your provider as well.   To learn more about what you can do with MyChart, go to [email or web address].    Your next appointment:   12 week(s)  The format for your next appointment:   In Person  Provider:   Berniece Salines, DO   Other Instructions      Adopting a Healthy Lifestyle.  Know what a healthy weight is for you (roughly BMI <25) and aim to maintain this   Aim for  7+ servings of fruits and vegetables daily   65-80+ fluid ounces of water or unsweet tea for healthy kidneys   Limit to max 1 drink of alcohol per day; avoid smoking/tobacco   Limit animal fats in diet for cholesterol and heart health - choose grass fed whenever available   Avoid highly processed foods, and foods high in saturated/trans fats   Aim for low stress - take time to unwind and care for your mental health   Aim for 150 min of moderate intensity exercise weekly for heart health, and weights twice weekly for bone health   Aim for 7-9 hours of sleep daily   When it comes to diets, agreement about the perfect plan isnt easy to find, even among the experts. Experts at the [employer] developed an idea known as the Healthy Eating Plate. Just imagine a plate divided into logical, healthy portions.   The emphasis is on diet quality:   Load up on vegetables and fruits - one-half of your plate: Aim for color and variety, and remember that potatoes dont count.   Go for whole grains - one-quarter of your plate: Whole wheat, barley, wheat berries, quinoa, oats, brown rice, and foods made with them. If you want pasta, go with whole wheat pasta.   Protein power - one-quarter of your plate: Fish, chicken, beans, and nuts are all healthy, versatile protein sources. Limit red meat.   The diet, however, does go beyond the plate, offering a few other suggestions.   Use healthy plant oils, such as olive, canola, soy, corn, sunflower and peanut. Check the labels, and avoid partially hydrogenated oil, which have unhealthy trans fats.   If youre thirsty, drink water. Coffee and tea are good in moderation, but skip sugary drinks and limit milk and dairy products to one or two daily servings.   The type of carbohydrate in the diet is more important than the amount. Some sources of carbohydrates, such as vegetables, fruits, whole grains, and beans-are healthier than others.    Finally,  stay active  Signed, Berniece Salines, DO  10/08/2020 3:31 PM    [facility]

## 2020-10-09 ENCOUNTER — Ambulatory Visit (HOSPITAL_COMMUNITY): Admission: RE | Admit: 2020-10-09 | Payer: BC Managed Care – PPO | Source: Ambulatory Visit

## 2020-10-16 ENCOUNTER — Ambulatory Visit (HOSPITAL_COMMUNITY)
Admission: RE | Admit: 2020-10-16 | Discharge: 2020-10-16 | Disposition: A | Discharge status: Home / Self Care | Payer: Medicaid Other | Source: Ambulatory Visit | Attending: Cardiology | Admitting: Cardiology

## 2020-10-16 ENCOUNTER — Encounter (HOSPITAL_COMMUNITY): Payer: Self-pay

## 2020-10-16 ENCOUNTER — Other Ambulatory Visit: Payer: Self-pay

## 2020-10-16 VITALS — BP 90/70 | HR 107 | Wt 98.0 lb

## 2020-10-16 DIAGNOSIS — Z8249 Family history of ischemic heart disease and other diseases of the circulatory system: Secondary | ICD-10-CM | POA: Insufficient documentation

## 2020-10-16 DIAGNOSIS — Z794 Long term (current) use of insulin: Secondary | ICD-10-CM | POA: Insufficient documentation

## 2020-10-16 DIAGNOSIS — Z7902 Long term (current) use of antithrombotics/antiplatelets: Secondary | ICD-10-CM | POA: Insufficient documentation

## 2020-10-16 DIAGNOSIS — Z87891 Personal history of nicotine dependence: Secondary | ICD-10-CM | POA: Insufficient documentation

## 2020-10-16 DIAGNOSIS — Z833 Family history of diabetes mellitus: Secondary | ICD-10-CM | POA: Diagnosis not present

## 2020-10-16 DIAGNOSIS — Z87440 Personal history of urinary (tract) infections: Secondary | ICD-10-CM | POA: Insufficient documentation

## 2020-10-16 DIAGNOSIS — J90 Pleural effusion, not elsewhere classified: Secondary | ICD-10-CM | POA: Insufficient documentation

## 2020-10-16 DIAGNOSIS — I110 Hypertensive heart disease with heart failure: Secondary | ICD-10-CM | POA: Diagnosis not present

## 2020-10-16 DIAGNOSIS — E119 Type 2 diabetes mellitus without complications: Secondary | ICD-10-CM | POA: Diagnosis not present

## 2020-10-16 DIAGNOSIS — I272 Pulmonary hypertension, unspecified: Secondary | ICD-10-CM | POA: Insufficient documentation

## 2020-10-16 DIAGNOSIS — I081 Rheumatic disorders of both mitral and tricuspid valves: Secondary | ICD-10-CM | POA: Insufficient documentation

## 2020-10-16 DIAGNOSIS — I2510 Atherosclerotic heart disease of native coronary artery without angina pectoris: Secondary | ICD-10-CM | POA: Diagnosis not present

## 2020-10-16 DIAGNOSIS — R4189 Other symptoms and signs involving cognitive functions and awareness: Secondary | ICD-10-CM | POA: Insufficient documentation

## 2020-10-16 DIAGNOSIS — I5022 Chronic systolic (congestive) heart failure: Secondary | ICD-10-CM

## 2020-10-16 DIAGNOSIS — Z79899 Other long term (current) drug therapy: Secondary | ICD-10-CM | POA: Diagnosis not present

## 2020-10-16 DIAGNOSIS — Z8673 Personal history of transient ischemic attack (TIA), and cerebral infarction without residual deficits: Secondary | ICD-10-CM | POA: Insufficient documentation

## 2020-10-16 DIAGNOSIS — E875 Hyperkalemia: Secondary | ICD-10-CM | POA: Diagnosis not present

## 2020-10-16 DIAGNOSIS — Z7982 Long term (current) use of aspirin: Secondary | ICD-10-CM | POA: Diagnosis not present

## 2020-10-16 LAB — BASIC METABOLIC PANEL
Anion gap: 11 (ref 5–15)
BUN: 26 mg/dL — ABNORMAL HIGH (ref 6–20)
CO2: 25 mmol/L (ref 22–32)
Calcium: 9.6 mg/dL (ref 8.9–10.3)
Chloride: 98 mmol/L (ref 98–111)
Creatinine, Ser: 1.43 mg/dL — ABNORMAL HIGH (ref 0.44–1.00)
GFR, Estimated: 43 mL/min — ABNORMAL LOW (ref >60)
Glucose, Bld: 491 mg/dL — ABNORMAL HIGH (ref 70–99)
Potassium: 4.7 mmol/L (ref 3.5–5.1)
Sodium: 134 mmol/L — ABNORMAL LOW (ref 135–145)

## 2020-10-16 MED ORDER — IVABRADINE HCL 5 MG PO TABS: 2.5000 mg | ORAL_TABLET | Freq: Two times a day (BID) | ORAL | 11 refills | Status: DC

## 2020-10-16 NOTE — Patient Instructions (Signed)
Labs done today. We will contact you only if your labs are abnormal.  START Corlanor 2.5mg  (1/2 tablet) by mouth 2 times daily.   No other medication changes were made. Please continue all current medications as prescribed.  Your physician recommends that you schedule a follow-up appointment in: 4 weeks   If you have any questions or concerns before your next appointment please send [place] a message through [place] or call our office at [phone].    TO LEAVE A MESSAGE FOR THE NURSE SELECT OPTION 2, PLEASE LEAVE A MESSAGE INCLUDING: YOUR NAME DATE OF BIRTH CALL BACK NUMBER REASON FOR CALL**this is important as we prioritize the call backs  YOU WILL RECEIVE A CALL BACK THE SAME DAY AS LONG AS YOU CALL BEFORE 4:00 PM   Do the following things EVERYDAY: Weigh yourself in the morning before breakfast. Write it down and keep it in a log. Take your medicines as prescribed Eat low salt foods--Limit salt (sodium) to 2000 mg per day.  Stay as active as you can everyday Limit all fluids for the day to less than 2 liters   At the [facility], you and your health needs are our priority. As part of our continuing mission to provide you with exceptional heart care, we have created designated Provider Care Teams. These Care Teams include your primary Cardiologist (physician) and Advanced Practice Providers (APPs- Physician Assistants and Nurse Practitioners) who all work together to provide you with the care you need, when you need it.   You may see any of the following providers on your designated Care Team at your next follow up: Dr Arvilla Meres Dr Carron Curie, NP Robbie Lis, [place] Karle Plumber, PharmD   Please be sure to bring in all your medications bottles to every appointment.

## 2020-10-16 NOTE — Progress Notes (Signed)
Advanced Heart Failure Clinic Note   Referring Physician: PCP: Claiborne Billings, MD PCP-Cardiologist: Berniece Salines, DO  AHFC: Dr. Haroldine Laws   HPI: 60 y/o female w/ h/o tobacco abuse, IDDM, family h/o premature ischemic heart disease (mother w/ MI in her 38s), and chronic systolic heart failure, first diagnosed in 2016 when she was admitted to [facility] for acute CHF. Echo at that time showed reduced LVEF 30-35% w/ mod LVH, mild-mod MR and mod PH w/ PA pressure 60-65 mmHg. She had diagnostic LHC performed by Dr. Martinique which showed mild nonobstructive CAD (10-20% LAD and 20% LCx disease, RCA was small, nondominant w/ no significant coronary plaque). She was placed on medical therapy for systolic heart failure w/ an ACEi + ? blocker. She was seen by general cardiology for her post hospital f/u, however was lost to f/u thereafter.    She was admitted to [facility] 03/11/20 for acute CVA w/ with left-sided weakness, impaired extraocular movement, right eyelid droop.  CT of Head and Neck showed distal basilar artery stenosis as well as acute thalamic stroke.  As part of stroke w/u, 2D echo was perfomed and showed no intracardiac source of embolus. EF however was lower compared to study in 2016, down to 20-25% w/ moderately reduced RV systolic function, severe LVH, severe MR and severe TR. Echo was suggestive of cardiac amyloidosis but PYP was negative for TTR amyloid.  cMRI not supportive of amyloid.  SPEP negative. Kappa light chains mildly elevated. Suspect non-specific. 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  During hospitalization, she developed cardiogenic shock and required milrinone and IV lasix. She appeared inotrope dependent but was not a candidate for home inotropes or advanced therapies due to other conformities. Also nursing facility dependent. Milrinone was weaned off. Co-ox remained ~50% off milrinone.   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  Unable to tolerate spiro or ARB/ARNI due to hyperkalemia. Bidil not well tolerated due to HAs. Now on hydralazine as monotherapy. B-blockers avoided due to recent shock. SGLT2is deferred due to frequent UTIs and poor perineal hygiene.   She was discharged to SNF on Lasix 40, Ivabradine 5 mg bid, hydralazine 12.5 tid and daily Lokelama to help w/ persistent hyperkalemia. I saw her for post hospital f/u 12/21 and she was doing fairly well at the time. She was instructed to return back for f/u but failed to do so.   She was seen by Cardiology last week on 6/1. Low dose Toprol XL was started 12.5 mg daily. Hydralazine and ivabradine discontinued. Referred back to [facility].   Here today w/ her daughter. Still at SNF. Cachetic appearing and ambulating slowly w/ walker. Tachycardic HR 107. BP soft 90/70. EKG shows sinus tach.   She reports that she is finally being discharge home today from SNF after several months of rehab. She will be living with her daughter, who will be her full time caregiver. She denies any significant limitation w/ exertion but is not very active since recent stroke. Also w/ cognitive impairment at baseline, thus hard to quantify functional class. She is not fluid overloaded on exam. BP is soft but she denies dizziness.   Her daughter seems confident that she will be able to manage her mother's meds but I have concerns regarding her understanding a well.      2D Echo 11/21   1. Left ventricular ejection  fraction, by estimation, is 20 to 25%. The left ventricle has severely decreased function. There is severe left ventricular hypertrophy. Left ventricular diastolic parameters are indeterminate. The average left ventricular global longitudinal strain is -6.7 %. The global longitudinal strain is abnormal, with a pattern of relative apical sparing. SEE RECOMMENDATIONS. 2. Right ventricular systolic  function is moderately reduced. The right ventricular size is mildly enlarged. Moderately increased right ventricular wall thickness. There is severely elevated pulmonary artery systolic pressure. The estimated right ventricular systolic pressure is 83.2 mmHg. 3. Left atrial size was moderately dilated. 4. Right atrial size was mild to moderately dilated. 5. The mitral valve is grossly normal. Severe mitral valve regurgitation. 6. Tricuspid valve regurgitation is severe. 7. The aortic valve is grossly normal. Aortic valve regurgitation is trivial. 8. The inferior vena cava is dilated in size with <50% respiratory variability, suggesting right atrial pressure of 15 mmHg. 9. A small pericardial effusion is present. The pericardial effusion is posterior to the left ventricle.   cMRI 03/17/20  IMPRESSION: 1.  Moderate right pleural effusion.   2. Normal LV size with mild LV hypertrophy, EF 30% with diffuse hypokinesis.   3. Mildly dilated RV without significant RVH, EF 42% (mildly decreased).   4. Moderate-severe MR and severe TR, flow sequences to quantify were not done.   5. Non-coronary LGE pattern with extensive mid-myocardial scar in the basal to mid septal, inferior, and lateral walls. This could be consistent with prior myocarditis versus cardiac amyloidosis. Wall thickness is not particularly impressive for amyloidosis and ECV percentage is only 33% (mildly elevated, often see higher with amyloidosis).    Review of systems complete and found to be negative unless listed in HPI.      Past Medical History:  Diagnosis Date   Abnormal LFTs    a. 05/2014 -  ALT 52, alk phos 130.   Chronic systolic CHF (congestive heart failure) ([place])    a. Dx 05/2014 - echo at [facility] - EF 30-35%, moderate LVH, no rWMA, mild to moderate MR, moderate PH with PA pressure 60-29mmHg, mild to moderate pericardial effusion. EF 30-35% by cath.   History of blood transfusion 1986   "related to  C-section"   Hypertension    Insulin dependent diabetes mellitus    Iron deficiency anemia    NICM (nonischemic cardiomyopathy) ([place])    a. [facility] 06/11/14:  minor nonobstructive CAD with mild irregularities in the LAD less than 10-20%, LCx with mild disease less than 20%, no significant disease in the RCA, EF 30-35%.   Pericardial effusion    a. Mild-moderate pericardial effusion by echo at [facility].   Pulmonary hypertension ([place])    a. Moderate PH by echo at [facility].   Tobacco abuse     Current Outpatient Medications  Medication Sig Dispense Refill   albuterol (VENTOLIN HFA) 108 (90 Base) MCG/ACT inhaler Inhale 2 puffs into the lungs every 6 (six) hours as needed for wheezing or shortness of breath.     aspirin 81 MG EC tablet TAKE 1 TABLET (81 MG TOTAL) BY MOUTH DAILY. 30 tablet 3   atorvastatin (LIPITOR) 40 MG tablet Take 1 tablet (40 mg total) by mouth every evening. 90 tablet 0   clopidogrel (PLAVIX) 75 MG tablet Take 1 tablet (75 mg total) by mouth daily. 30 tablet 0   insulin glargine (LANTUS) 100 UNIT/ML injection Inject 0.18 mLs (18 Units total) into the skin at bedtime. 10 mL 11   insulin lispro (HUMALOG) 100 UNIT/ML KwikPen Inject  5 Units into the skin 3 (three) times daily before meals.     ivabradine (CORLANOR) 5 MG TABS tablet Take 0.5 tablets (2.5 mg total) by mouth 2 (two) times daily with a meal. 30 tablet 11   metFORMIN (GLUCOPHAGE) 500 MG tablet Take 500 mg by mouth 2 (two) times daily.     metoprolol succinate (TOPROL XL) 25 MG 24 hr tablet Take 0.5 tablets (12.5 mg total) by mouth daily. 45 tablet 3   mirtazapine (REMERON SOL-TAB) 15 MG disintegrating tablet Take 0.5 tablets (7.5 mg total) by mouth at bedtime. (Patient taking differently: Take 15 mg by mouth at bedtime. 1 & 1/2 tablet by mouth) 30 tablet 0   ondansetron (ZOFRAN) 4 MG tablet Take by mouth.     sodium zirconium cyclosilicate (LOKELMA) 10 g PACK packet Take 10 g by mouth 2 (two) times daily. 11 each 0    traMADol (ULTRAM) 50 MG tablet Take 50 mg by mouth every 6 (six) hours as needed.     No current facility-administered medications for this encounter.    No Known Allergies    Social History   Socioeconomic History   Marital status: Divorced    Spouse name: Not on file   Number of children: Not on file   Years of education: Not on file   Highest education level: Not on file  Occupational History   Not on file  Tobacco Use   Smoking status: Former    Packs/day: 0.50    Years: 22.00    Pack years: 11.00    Types: Cigarettes    Quit date: 03/26/2015    Years since quitting: 5.5   Smokeless tobacco: Never  Substance and Sexual Activity   Alcohol use: No   Drug use: No   Sexual activity: Yes  Other Topics Concern   Not on file  Social History Narrative   Not on file   Social Determinants of Health   Financial Resource Strain: Not on file  Food Insecurity: Not on file  Transportation Needs: Not on file  Physical Activity: Not on file  Stress: Not on file  Social Connections: Not on file  Intimate Partner Violence: Not on file      Family History  Problem Relation Age of Onset   Coronary artery disease Mother    Diabetes Mother    Lupus Mother    Stroke Mother    Lung cancer Father     Vitals:   10/16/20 1144  BP: 90/70  Pulse: (!) 107  SpO2: 99%  Weight: 44.5 kg (98 lb)      PHYSICAL EXAM: General:  thin/cachetic appearing AAF, looks older than actual age, ambulating slowly w/ walker. No respiratory difficulty HEENT: normal Neck: supple. no JVD. Carotids 2+ bilat; no bruits. No lymphadenopathy or thyromegaly appreciated. Cor: PMI nondisplaced. Regular rate & rhythm. No rubs, gallops or murmurs. Lungs: clear Abdomen: soft, nontender, nondistended. No hepatosplenomegaly. No bruits or masses. Good bowel sounds. Extremities: no cyanosis, clubbing, rash, edema, thin extremities  Neuro: alert & oriented x 3, cranial nerves grossly intact. moves all 4  extremities w/o difficulty. Affect pleasant. Cognitive impairment at baseline.    ECG: sinus tach 105 bpm, LVH    ASSESSMENT & PLAN:  1. Chronic Systolic Heart Failure  - Echo 2016 EF 30-35%, RV moderately reduced. LHC showed mild nonobstructive CAD - Echo 11/21 EF 20-25%, RV severely reduced, severe LVH and thicken valves concerning for AL amyloid. Echo suggestive of cardiac amyloidosis but PYP  was negative for TTR amyloid.  cMRI not supportive of amyloid.  SPEP negative. Kappa light chains mildly elevated. Suspect non-specific. 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 - required milrinone admit 11/21 but not candidate for home inotropes or advanced therapies.  - Volume status good on exam. She denies any significant limitation w/ exertion but is not very active since recent stroke. Also w/ cognitive impairment at baseline, thus hard to quantify functional class.  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  - Hydralazine recently discontinued due to soft BP  - Tolerating low dose Toprol XL ok, continue 12.5 mg daily. Will not titrate further given soft BP  - restart ivabradine 2.5 mg bid for persistent tachycardia  - not a good candidate for SGLT2i due to frequent UTIs and poor perineal hygiene  - no need for loop diuretic currently - discussed w/ pt and daughter importance of strict med compliance, daily wts and low sodium diet once discharged from SNF    2. H/O CVA 11/21  - CT of head and neck showed basilar artery stenosis as well as acute thalamic stroke.  - no intracardiac source of embolus noted on echo and no afib on tele  - placed on ASA, Plavix and statin per neuro     3. IDDM  - on insulin, management per PCP    4. CAD - mild nonobstructive CAD by cath in 2016, w/  10-20% LAD and 20% LCx disease, RCA was small,  nondominant w/ no significant coronary plaque - stable w/o CP  - continue medical management, ASA 81 mg, Plavix 75 mg, and Lipitor 40 mg    5.  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 Cognitive Impairment w/ Poor Health Literacy  - reports that she is finally being discharged home today from SNF after several months of rehab following her stroke. She will be living with her daughter, who will be her full time caregiver. Her daughter seems confident that she will be able to manage her mother's meds but I have concerns regarding her understanding a awell. I will have then return to clinic in 4 weeks to see how she is doing and further assess med compliance. I have asked them to bring all meds with them to f/u appt. If concerns for poor understanding/ compliance, will refer to paramedicine, though may be difficult as she will be residing in [facility].     RTC in 4 weeks w/ APP    Lyda Jester, PA-C 10/16/20

## 2020-10-22 ENCOUNTER — Encounter (HOSPITAL_COMMUNITY): Payer: Self-pay | Admitting: Cardiology

## 2020-10-24 ENCOUNTER — Encounter (HOSPITAL_COMMUNITY): Payer: Self-pay

## 2020-10-24 ENCOUNTER — Observation Stay (HOSPITAL_COMMUNITY): Payer: Medicaid Other

## 2020-10-24 ENCOUNTER — Other Ambulatory Visit: Payer: Self-pay

## 2020-10-24 ENCOUNTER — Emergency Department (HOSPITAL_COMMUNITY): Payer: Medicaid Other

## 2020-10-24 ENCOUNTER — Inpatient Hospital Stay (HOSPITAL_COMMUNITY)
Admission: EM | Admit: 2020-10-24 | Discharge: 2020-10-28 | DRG: 690 | Disposition: A | Discharge status: Home / Self Care | Payer: Medicaid Other | Attending: Internal Medicine | Admitting: Internal Medicine

## 2020-10-24 DIAGNOSIS — E1165 Type 2 diabetes mellitus with hyperglycemia: Secondary | ICD-10-CM | POA: Diagnosis present

## 2020-10-24 DIAGNOSIS — A498 Other bacterial infections of unspecified site: Secondary | ICD-10-CM | POA: Diagnosis present

## 2020-10-24 DIAGNOSIS — Z7982 Long term (current) use of aspirin: Secondary | ICD-10-CM

## 2020-10-24 DIAGNOSIS — Z66 Do not resuscitate: Secondary | ICD-10-CM | POA: Diagnosis present

## 2020-10-24 DIAGNOSIS — I69398 Other sequelae of cerebral infarction: Secondary | ICD-10-CM

## 2020-10-24 DIAGNOSIS — N390 Urinary tract infection, site not specified: Principal | ICD-10-CM | POA: Diagnosis present

## 2020-10-24 DIAGNOSIS — Z681 Body mass index (BMI) 19 or less, adult: Secondary | ICD-10-CM

## 2020-10-24 DIAGNOSIS — Z597 Insufficient social insurance and welfare support: Secondary | ICD-10-CM

## 2020-10-24 DIAGNOSIS — I5022 Chronic systolic (congestive) heart failure: Secondary | ICD-10-CM | POA: Diagnosis present

## 2020-10-24 DIAGNOSIS — Z87891 Personal history of nicotine dependence: Secondary | ICD-10-CM

## 2020-10-24 DIAGNOSIS — Z8249 Family history of ischemic heart disease and other diseases of the circulatory system: Secondary | ICD-10-CM

## 2020-10-24 DIAGNOSIS — Z823 Family history of stroke: Secondary | ICD-10-CM

## 2020-10-24 DIAGNOSIS — Z833 Family history of diabetes mellitus: Secondary | ICD-10-CM

## 2020-10-24 DIAGNOSIS — K8010 Calculus of gallbladder with chronic cholecystitis without obstruction: Secondary | ICD-10-CM | POA: Diagnosis present

## 2020-10-24 DIAGNOSIS — I10 Essential (primary) hypertension: Secondary | ICD-10-CM

## 2020-10-24 DIAGNOSIS — I428 Other cardiomyopathies: Secondary | ICD-10-CM | POA: Diagnosis present

## 2020-10-24 DIAGNOSIS — Z20822 Contact with and (suspected) exposure to covid-19: Secondary | ICD-10-CM | POA: Diagnosis present

## 2020-10-24 DIAGNOSIS — I110 Hypertensive heart disease with heart failure: Secondary | ICD-10-CM | POA: Diagnosis present

## 2020-10-24 DIAGNOSIS — Z7902 Long term (current) use of antithrombotics/antiplatelets: Secondary | ICD-10-CM

## 2020-10-24 DIAGNOSIS — E782 Mixed hyperlipidemia: Secondary | ICD-10-CM | POA: Diagnosis present

## 2020-10-24 DIAGNOSIS — R630 Anorexia: Secondary | ICD-10-CM | POA: Diagnosis present

## 2020-10-24 DIAGNOSIS — Z7984 Long term (current) use of oral hypoglycemic drugs: Secondary | ICD-10-CM

## 2020-10-24 DIAGNOSIS — I5084 End stage heart failure: Secondary | ICD-10-CM | POA: Diagnosis present

## 2020-10-24 DIAGNOSIS — Z720 Tobacco use: Secondary | ICD-10-CM

## 2020-10-24 DIAGNOSIS — I272 Pulmonary hypertension, unspecified: Secondary | ICD-10-CM | POA: Diagnosis present

## 2020-10-24 DIAGNOSIS — E118 Type 2 diabetes mellitus with unspecified complications: Secondary | ICD-10-CM

## 2020-10-24 DIAGNOSIS — Z794 Long term (current) use of insulin: Secondary | ICD-10-CM

## 2020-10-24 DIAGNOSIS — R64 Cachexia: Secondary | ICD-10-CM | POA: Diagnosis present

## 2020-10-24 DIAGNOSIS — R410 Disorientation, unspecified: Secondary | ICD-10-CM

## 2020-10-24 DIAGNOSIS — R627 Adult failure to thrive: Secondary | ICD-10-CM | POA: Diagnosis present

## 2020-10-24 DIAGNOSIS — I69392 Facial weakness following cerebral infarction: Secondary | ICD-10-CM

## 2020-10-24 DIAGNOSIS — Z8673 Personal history of transient ischemic attack (TIA), and cerebral infarction without residual deficits: Secondary | ICD-10-CM

## 2020-10-24 DIAGNOSIS — B964 Proteus (mirabilis) (morganii) as the cause of diseases classified elsewhere: Secondary | ICD-10-CM | POA: Diagnosis present

## 2020-10-24 LAB — COMPREHENSIVE METABOLIC PANEL
ALT: 27 U/L (ref 0–44)
AST: 33 U/L (ref 15–41)
Albumin: 3.9 g/dL (ref 3.5–5.0)
Alkaline Phosphatase: 80 U/L (ref 38–126)
Anion gap: 9 (ref 5–15)
BUN: 21 mg/dL — ABNORMAL HIGH (ref 6–20)
CO2: 23 mmol/L (ref 22–32)
Calcium: 10 mg/dL (ref 8.9–10.3)
Chloride: 107 mmol/L (ref 98–111)
Creatinine, Ser: 1.3 mg/dL — ABNORMAL HIGH (ref 0.44–1.00)
GFR, Estimated: 48 mL/min — ABNORMAL LOW (ref >60)
Glucose, Bld: 66 mg/dL — ABNORMAL LOW (ref 70–99)
Potassium: 5.3 mmol/L — ABNORMAL HIGH (ref 3.5–5.1)
Sodium: 139 mmol/L (ref 135–145)
Total Bilirubin: 0.8 mg/dL (ref 0.3–1.2)
Total Protein: 8.2 g/dL — ABNORMAL HIGH (ref 6.5–8.1)

## 2020-10-24 LAB — RAPID URINE DRUG SCREEN, HOSP PERFORMED
Amphetamines: NOT DETECTED
Barbiturates: NOT DETECTED
Benzodiazepines: NOT DETECTED
Cocaine: NOT DETECTED
Opiates: NOT DETECTED
Tetrahydrocannabinol: NOT DETECTED

## 2020-10-24 LAB — CREATININE, SERUM
Creatinine, Ser: 1.26 mg/dL — ABNORMAL HIGH (ref 0.44–1.00)
GFR, Estimated: 49 mL/min — ABNORMAL LOW (ref >60)

## 2020-10-24 LAB — CBC
HCT: 39.5 % (ref 36.0–46.0)
HCT: 37.5 % (ref 36.0–46.0)
Hemoglobin: 12.4 g/dL (ref 12.0–15.0)
Hemoglobin: 11.8 g/dL — ABNORMAL LOW (ref 12.0–15.0)
MCH: 30.5 pg (ref 26.0–34.0)
MCH: 30.6 pg (ref 26.0–34.0)
MCHC: 31.4 g/dL (ref 30.0–36.0)
MCHC: 31.5 g/dL (ref 30.0–36.0)
MCV: 97.3 fL (ref 80.0–100.0)
MCV: 97.4 fL (ref 80.0–100.0)
Platelets: 361 10*3/uL (ref 150–400)
Platelets: 333 10*3/uL (ref 150–400)
RBC: 4.06 MIL/uL (ref 3.87–5.11)
RBC: 3.85 MIL/uL — ABNORMAL LOW (ref 3.87–5.11)
RDW: 13.2 % (ref 11.5–15.5)
RDW: 13.2 % (ref 11.5–15.5)
WBC: 11.4 10*3/uL — ABNORMAL HIGH (ref 4.0–10.5)
WBC: 9.4 10*3/uL (ref 4.0–10.5)
nRBC: 0 % (ref 0.0–0.2)
nRBC: 0 % (ref 0.0–0.2)

## 2020-10-24 LAB — URINALYSIS, ROUTINE W REFLEX MICROSCOPIC
Bilirubin Urine: NEGATIVE
Glucose, UA: 150 mg/dL — AB
Hgb urine dipstick: NEGATIVE
Ketones, ur: NEGATIVE mg/dL
Nitrite: POSITIVE — AB
Protein, ur: NEGATIVE mg/dL
Specific Gravity, Urine: 1.01 (ref 1.005–1.030)
pH: 8 (ref 5.0–8.0)

## 2020-10-24 LAB — DIFFERENTIAL
Abs Immature Granulocytes: 0.04 10*3/uL (ref 0.00–0.07)
Basophils Absolute: 0.1 10*3/uL (ref 0.0–0.1)
Basophils Relative: 1 %
Eosinophils Absolute: 0 10*3/uL (ref 0.0–0.5)
Eosinophils Relative: 0 %
Immature Granulocytes: 0 %
Lymphocytes Relative: 19 %
Lymphs Abs: 2.2 10*3/uL (ref 0.7–4.0)
Monocytes Absolute: 0.7 10*3/uL (ref 0.1–1.0)
Monocytes Relative: 6 %
Neutro Abs: 8.4 10*3/uL — ABNORMAL HIGH (ref 1.7–7.7)
Neutrophils Relative %: 74 %

## 2020-10-24 LAB — TROPONIN I (HIGH SENSITIVITY)
Troponin I (High Sensitivity): 6 ng/L (ref <18)
Troponin I (High Sensitivity): 6 ng/L (ref <18)

## 2020-10-24 LAB — RESP PANEL BY RT-PCR (FLU A&B, COVID) ARPGX2
Influenza A by PCR: NEGATIVE
Influenza B by PCR: NEGATIVE
SARS Coronavirus 2 by RT PCR: NEGATIVE

## 2020-10-24 LAB — APTT: aPTT: 25 seconds (ref 24–36)

## 2020-10-24 LAB — ETHANOL: Alcohol, Ethyl (B): <10 mg/dL (ref <10)

## 2020-10-24 LAB — PROTIME-INR
INR: 0.9 (ref 0.8–1.2)
Prothrombin Time: 12.5 seconds (ref 11.4–15.2)

## 2020-10-24 LAB — GLUCOSE, CAPILLARY: Glucose-Capillary: 246 mg/dL — ABNORMAL HIGH (ref 70–99)

## 2020-10-24 IMAGING — CT CT HEAD W/O CM
4 series · 16 of 47 positions shown, 18 images · non-contrast
Comparison: CT angiogram head [DATE].  Brain MRI [DATE].

CLINICAL DATA: Neuro deficit, acute, stroke suspected. Additional
history provided: Generalized weakness today, inability to stand.
History of prior stroke.

EXAM:
CT HEAD WITHOUT CONTRAST
TECHNIQUE: Contiguous axial images were obtained from the base of the skull
through the vertex without intravenous contrast.

[Series 3: head without · axial · non-contrast · 0.42mm/px · z∈[-107,-7]mm · 7 of 28 slices shown, 9 images]
[im 4/28  brain]
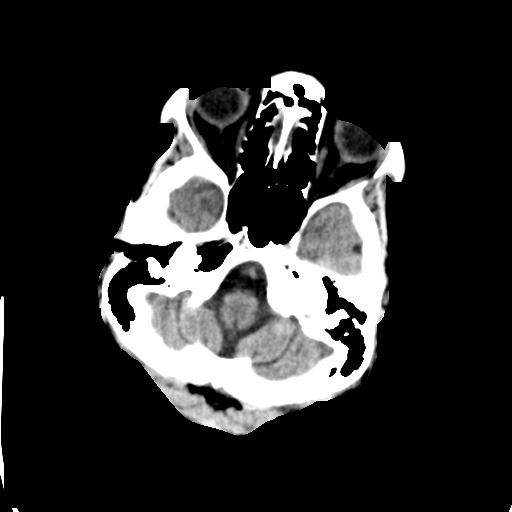
[im 4/28  bone]
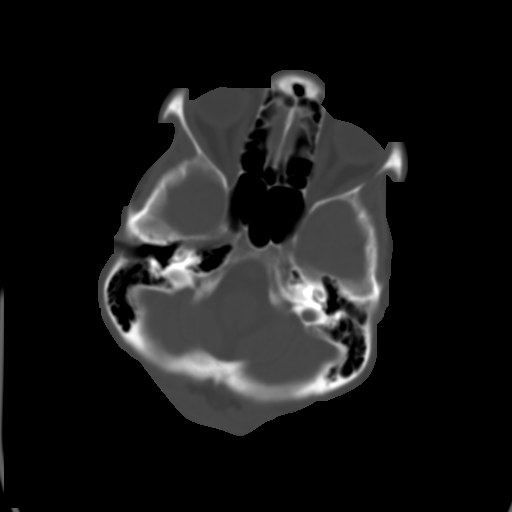
[im 7/28  brain]
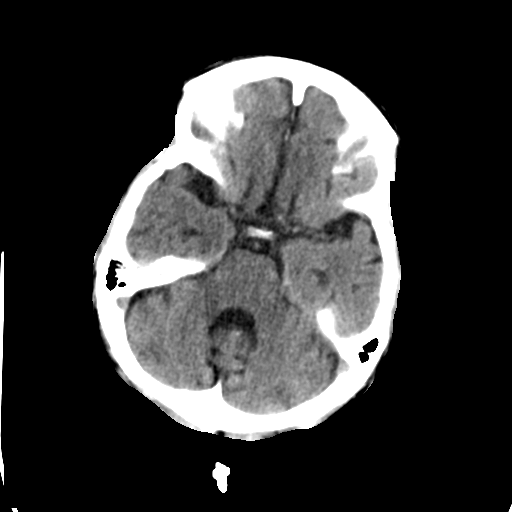
[im 11/28  brain]
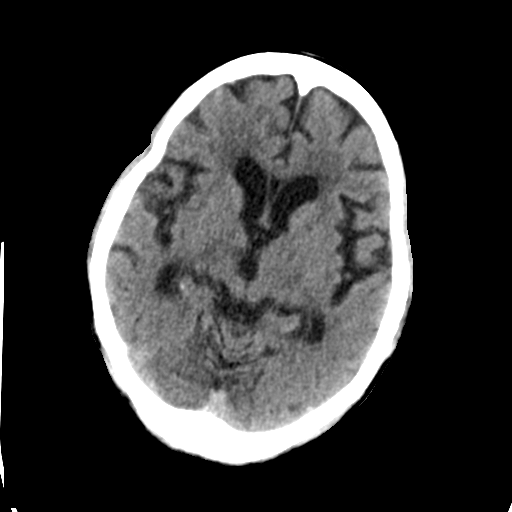
[im 14/28  brain]
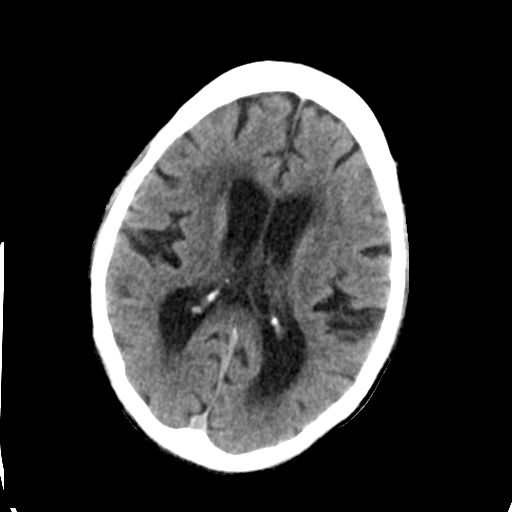
[im 17/28  brain]
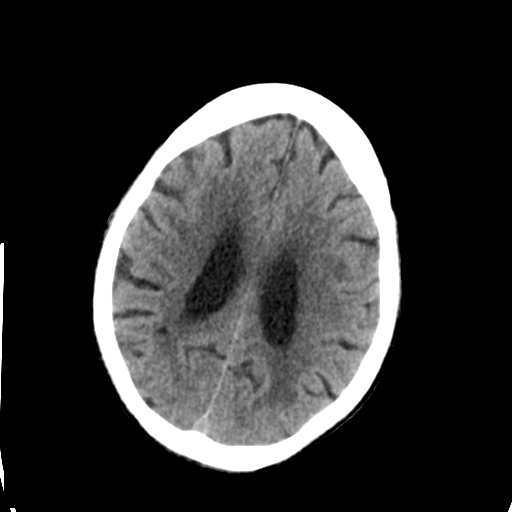
[im 17/28  bone]
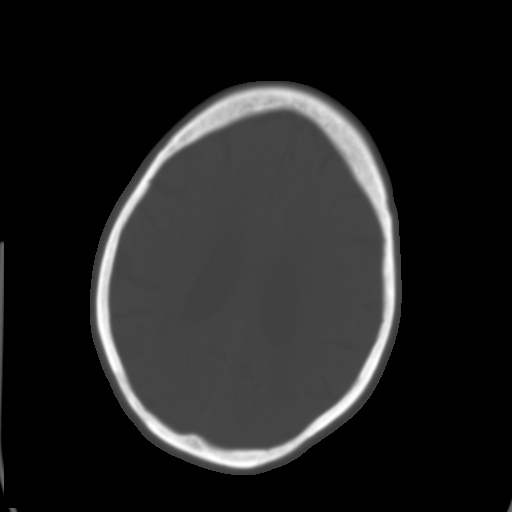
[im 21/28  brain]
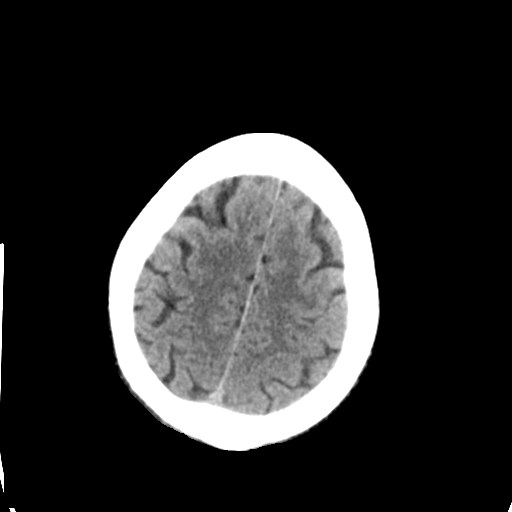
[im 24/28  brain]
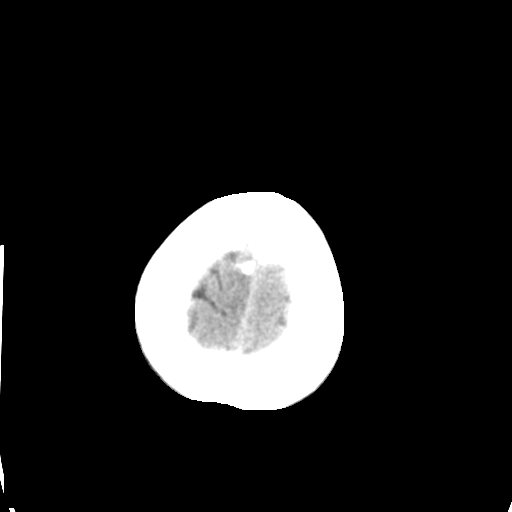

[Series 4: head bone · axial · 0.42mm/px · z∈[-110,-82]mm · 3 of 68 slices shown]
[im 7/68  bone]
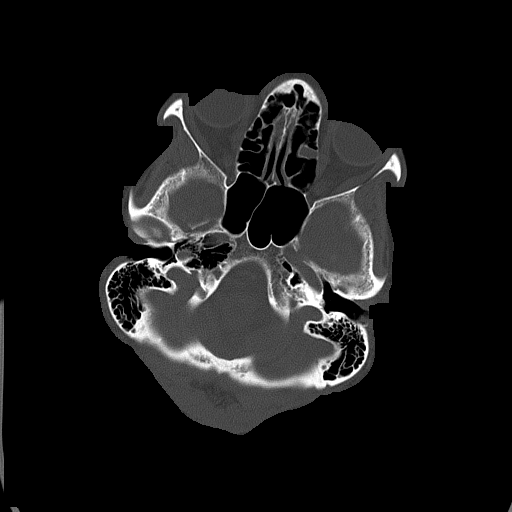
[im 14/68  bone]
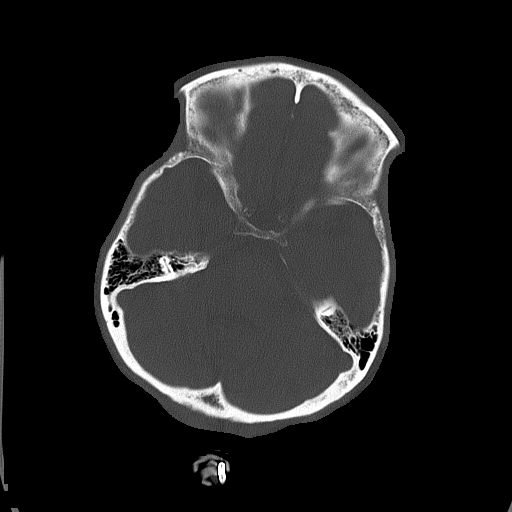
[im 21/68  bone]
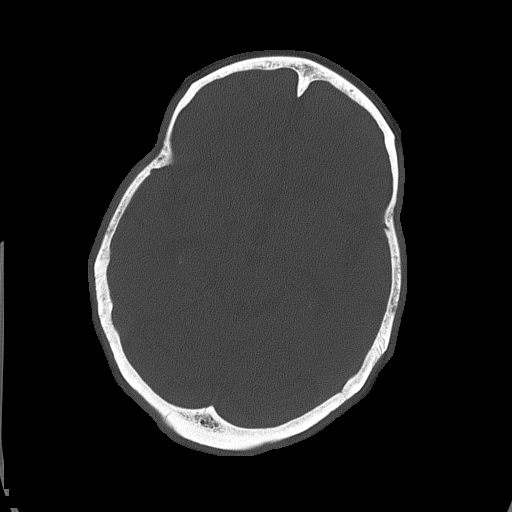

[Series 5: head without cor · coronal · non-contrast · 0.28mm/px · 3 of 71 slices shown]
[im 24/71  brain]
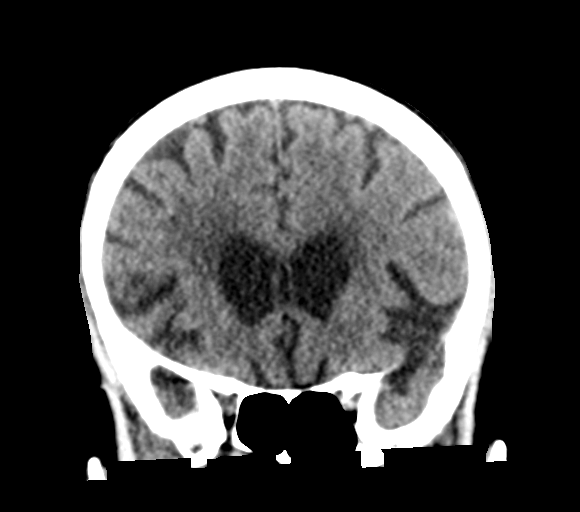
[im 32/71  brain]
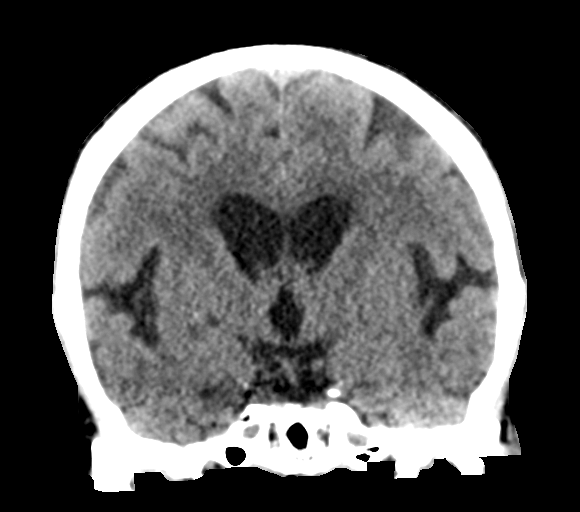
[im 39/71  brain]
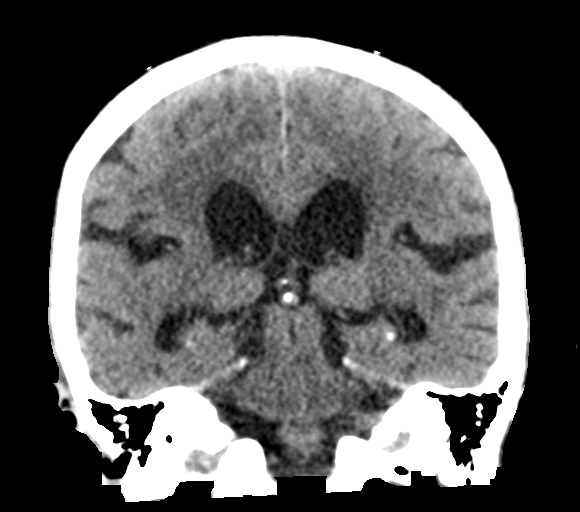

[Series 6: head without sag · sagittal · non-contrast · 0.30mm/px · 3 of 66 slices shown]
[im 22/66  brain]
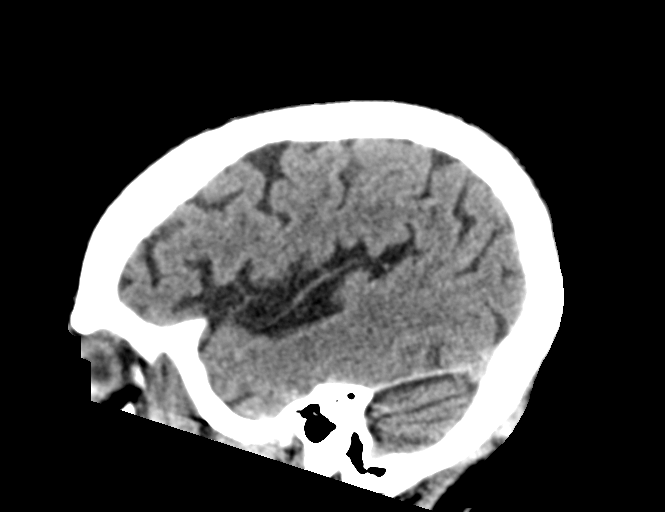
[im 33/66  brain]
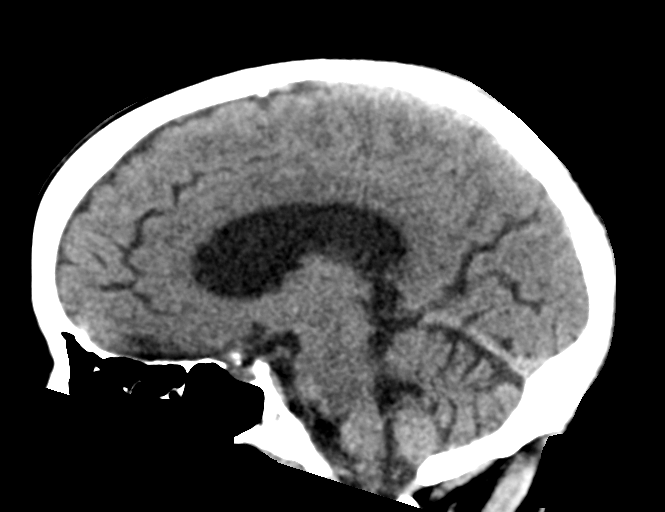
[im 44/66  brain]
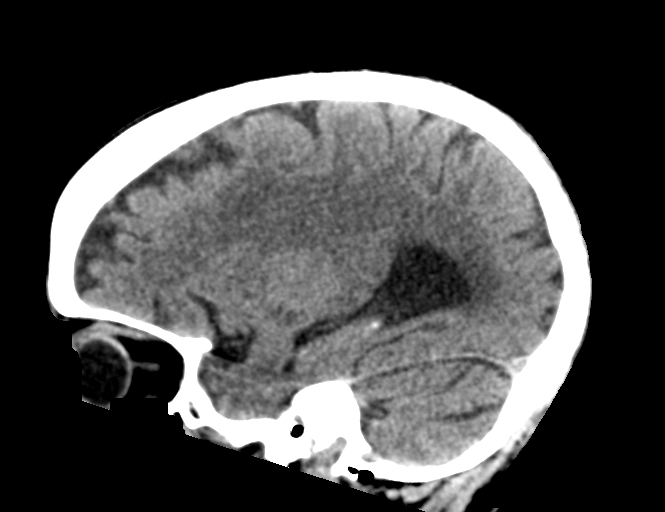

[16 of 47 positions shown; findings below may reference images not displayed]

FINDINGS: Brain:

Stable cerebral and cerebellar atrophy.

Redemonstrated small chronic cortical/subcortical infarct within the
left parietal lobe.

Redemonstrated remote lacunar infarct within the right parietal
white matter (series 3, image 18).

Mild-to-moderate ill-defined hypoattenuation within the cerebral
white matter is nonspecific, but compatible with chronic small
vessel ischemic disease.

Known chronic infarct within the right thalamus and midbrain, better
appreciated on the brain MRI of [DATE] (acute at that time).

There is no acute intracranial hemorrhage.

No acute demarcated cortical infarct.

No extra-axial fluid collection.

No evidence of intracranial mass.

No midline shift.

Vascular: No hyperdense vessel.  Atherosclerotic calcifications.

Skull: Normal. Negative for fracture or focal lesion.

Sinuses/Orbits: Visualized orbits show no acute finding.
Opacification of the left ethmoid air cell. Background trace
bilateral ethmoid sinus mucosal thickening.
IMPRESSION: No evidence of acute intracranial abnormality.

Redemonstrated chronic infarcts within the cortical/subcortical left
parietal lobe, right parietal white matter and right
thalamus/midbrain.

Stable background mild-to-moderate cerebral white matter chronic
small vessel ischemic disease.

Stable cerebral and cerebellar atrophy.

Mild paranasal sinus disease at the imaged levels, as described.

## 2020-10-24 IMAGING — CR DG CHEST 1V
1 series · 1 of 1 positions shown · non-contrast
Comparison: One-view chest x-ray [DATE]

CLINICAL DATA: Confused.  Clearance for MRI.

EXAM:
CHEST  1 VIEW

[chest ap]
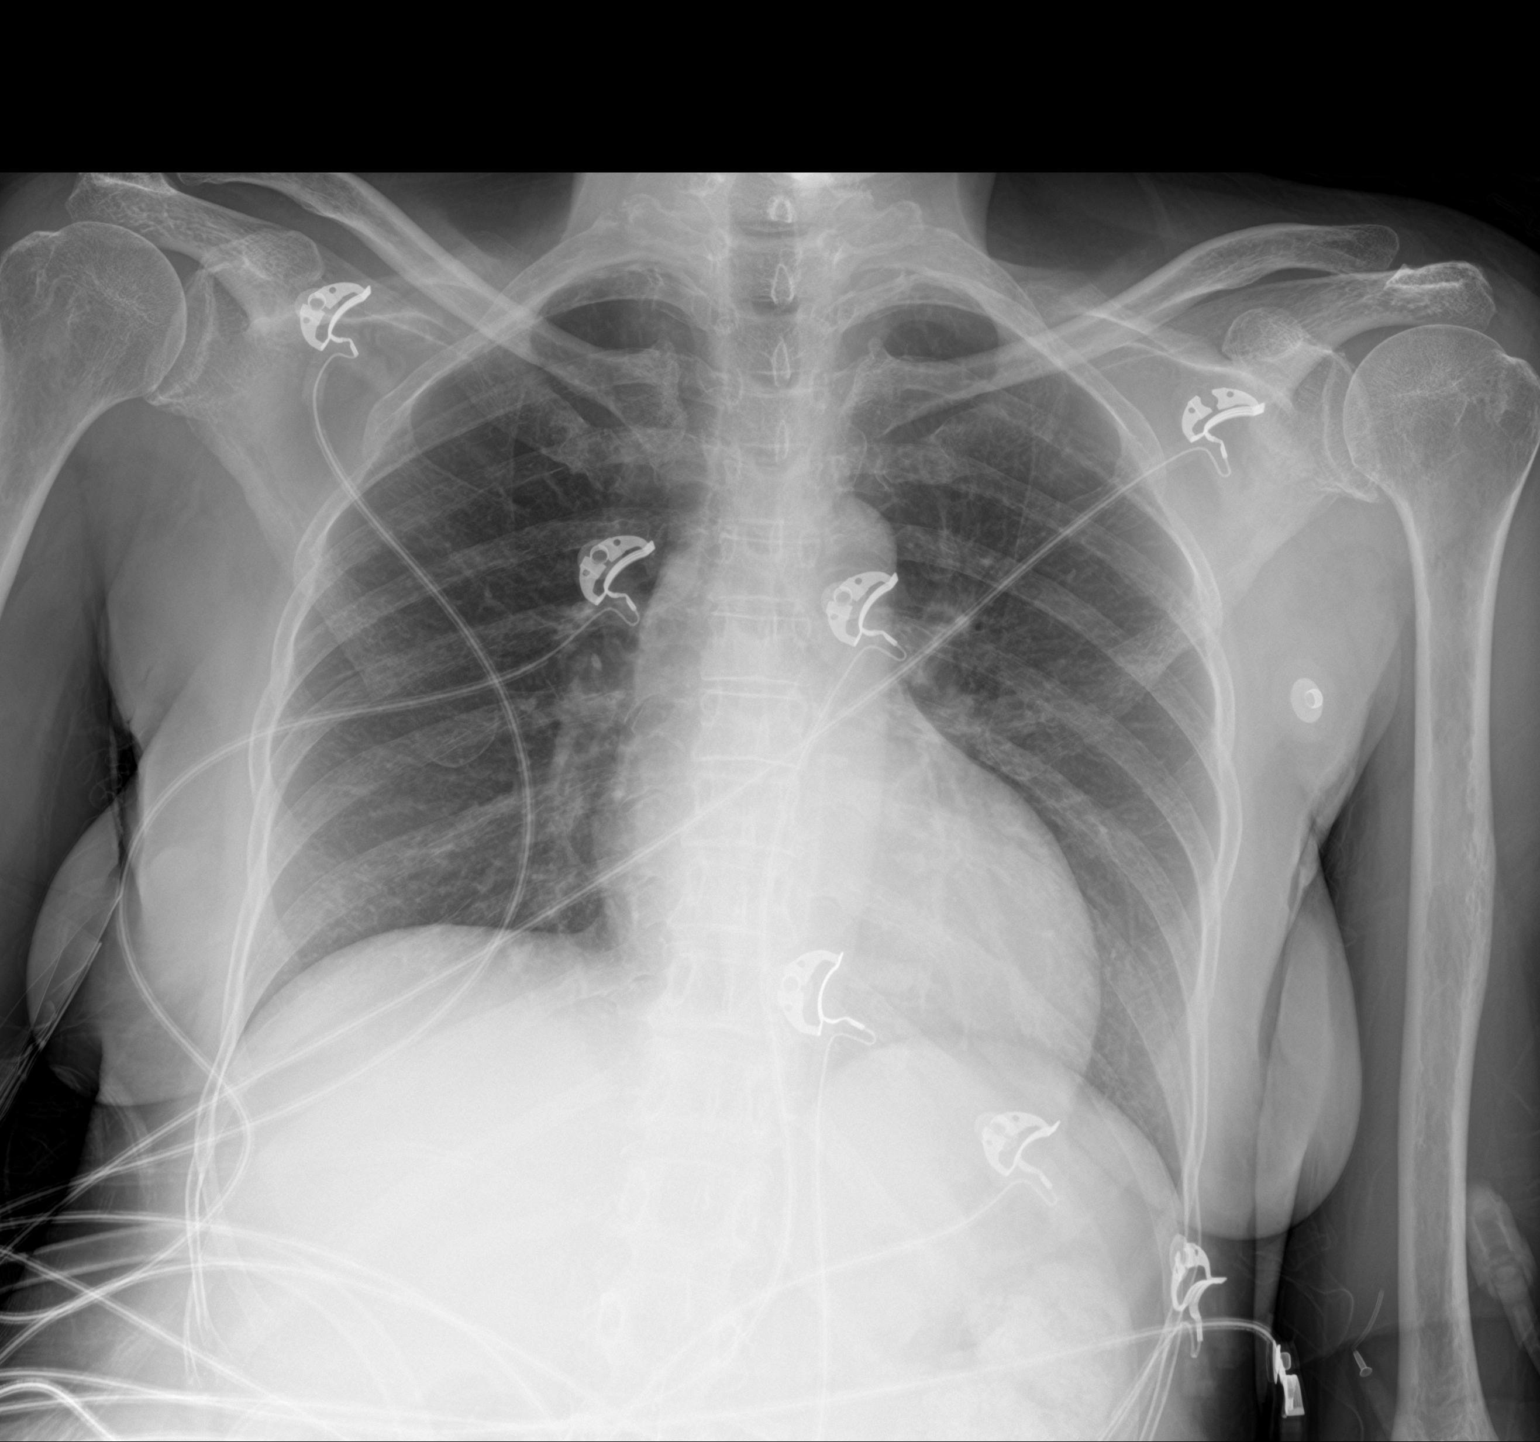

[1 of 1 positions shown; findings below may reference images not displayed]

FINDINGS: The heart size and mediastinal contours are within normal limits.
Both lungs are clear. The visualized skeletal structures are
unremarkable.
IMPRESSION: No radiopaque foreign body.

No acute cardiopulmonary disease.

## 2020-10-24 IMAGING — MR MR HEAD W/O CM
6 of 11 series · 24 of 48 positions shown · non-contrast
Comparison: Prior head CT examinations [DATE] and earlier.
Brain MRI [DATE].

CLINICAL DATA: Neuro deficit, acute, stroke suspected.

EXAM:
MRI HEAD WITHOUT CONTRAST
TECHNIQUE: Multiplanar, multiecho pulse sequences of the brain and surrounding
structures were obtained without intravenous contrast.

[Series 3: DWI · axial · 3.0mm · 0.94mm/px · z∈[-98,+52]mm · 6 of 94 slices shown (1 of 2)]
[im 1/94]
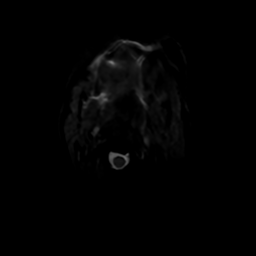
[im 19/94]
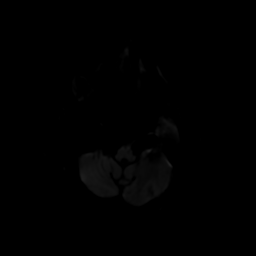
[im 38/94]
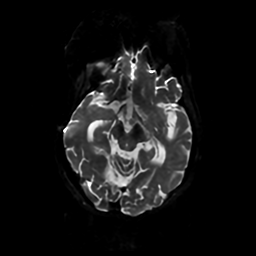
[im 56/94]
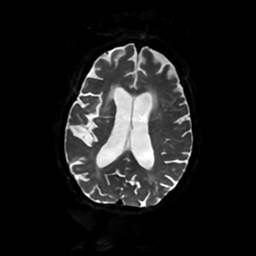
[im 75/94]
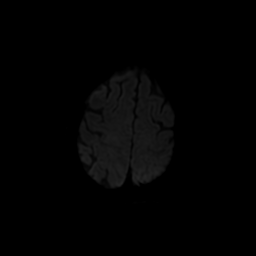
[im 94/94]
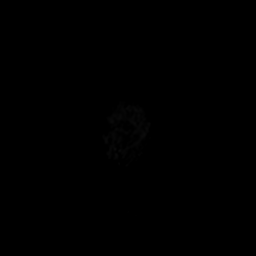

[Series 4: DWI · coronal · 4.0mm · 0.94mm/px · 6 of 72 slices shown (2 of 2)]
[im 1/72]
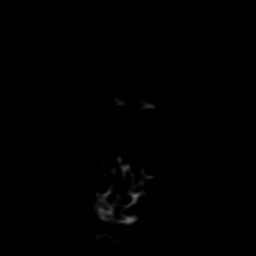
[im 15/72]
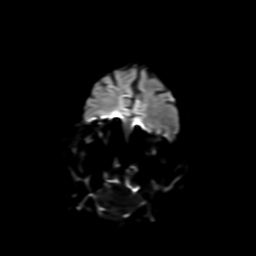
[im 29/72]
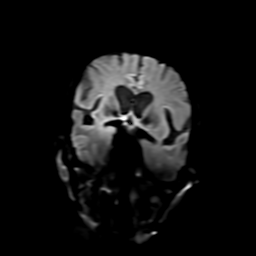
[im 43/72]
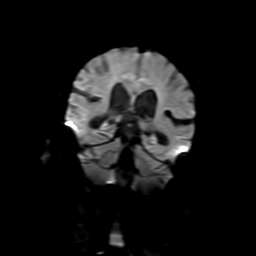
[im 57/72]
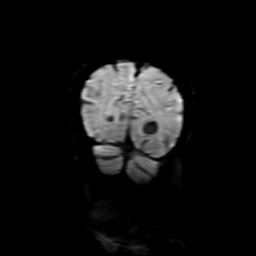
[im 72/72]
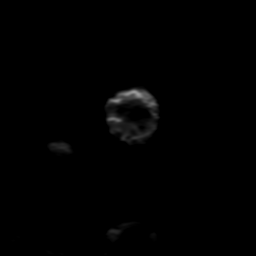

[Series 5: FLAIR · sagittal · 5.0mm · 0.23mm/px · 2 of 23 slices shown (1 of 2)]
[im 1/23]
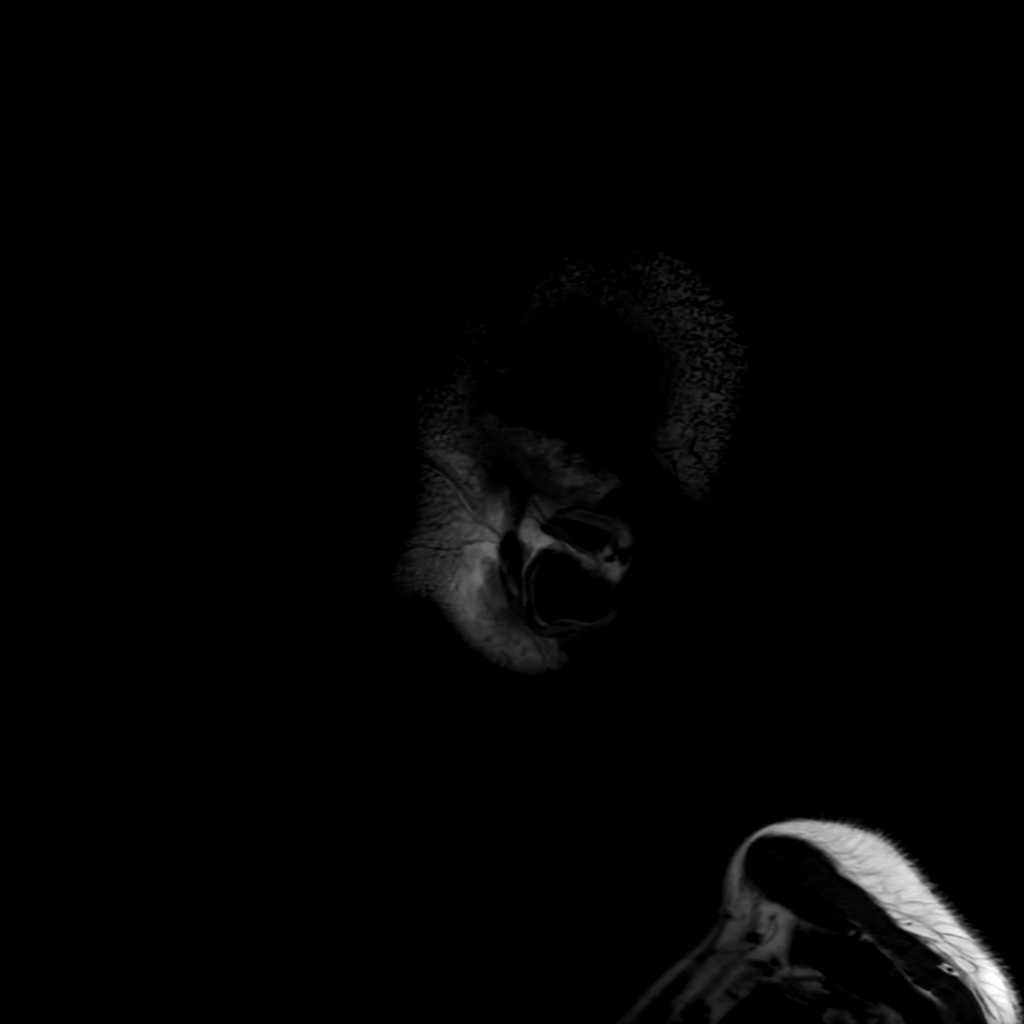
[im 23/23]
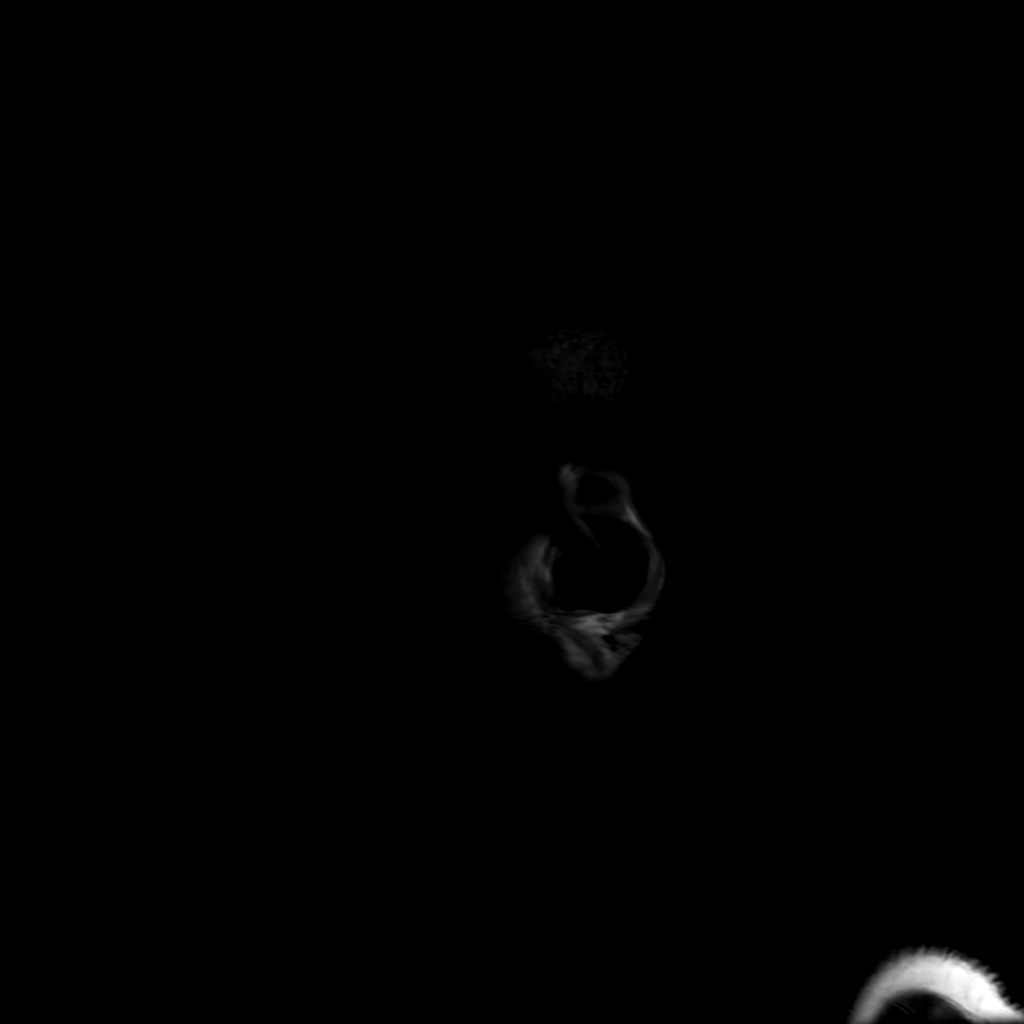

[Series 7: FLAIR · axial · 4.0mm · 0.47mm/px · z∈[-94,+54]mm · 3 of 35 slices shown (2 of 2)]
[im 1/35]
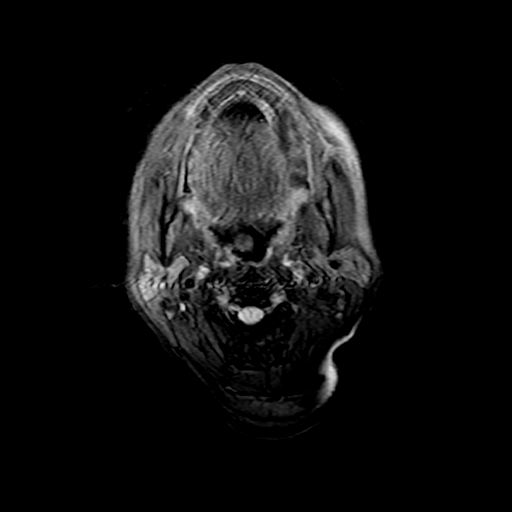
[im 18/35]
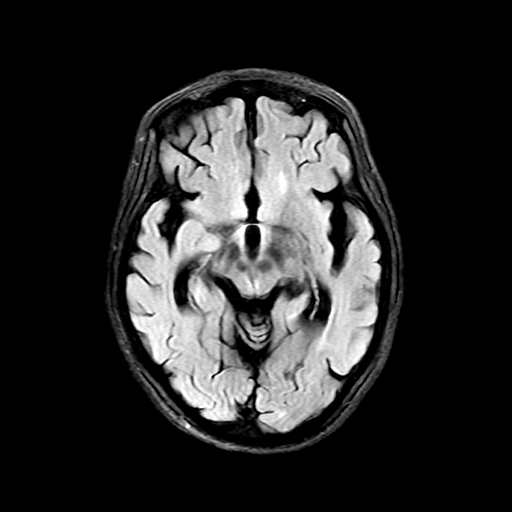
[im 35/35]
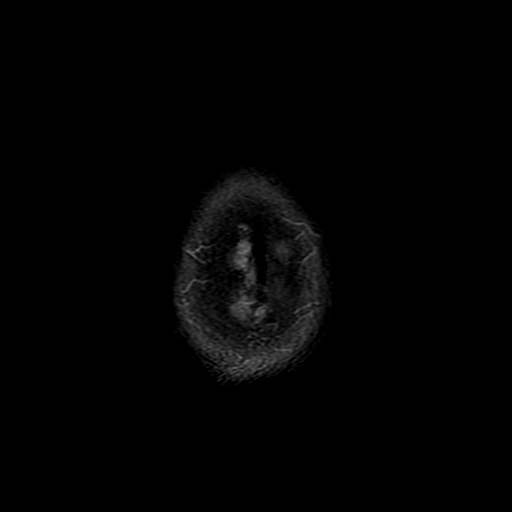

[Series 350: ADC · axial · 3.0mm · 0.94mm/px · z∈[-98,+52]mm · 4 of 51 slices shown (1 of 2)]
[im 1/51]
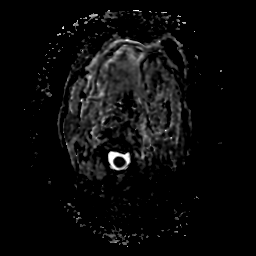
[im 17/51]
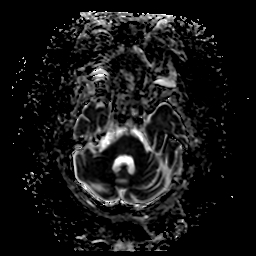
[im 34/51]
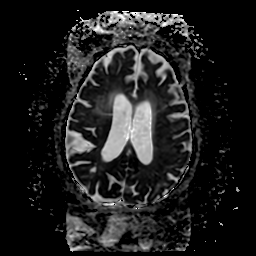
[im 51/51]
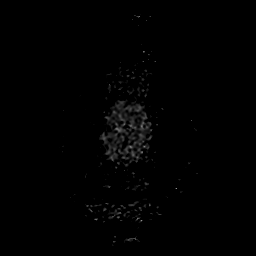

[Series 450: ADC · coronal · 4.0mm · 0.94mm/px · 3 of 36 slices shown (2 of 2)]
[im 1/36]
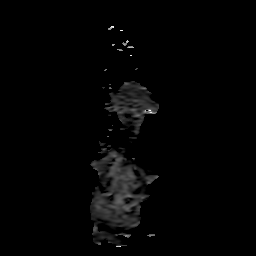
[im 18/36]
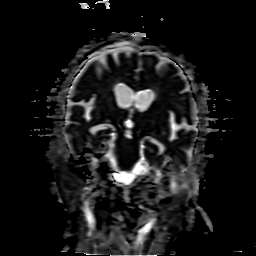
[im 36/36]
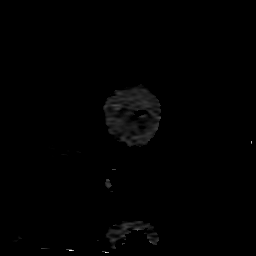

[24 of 48 positions shown; findings below may reference images not displayed]

FINDINGS: Brain:

Mild intermittent motion degradation.

Mild-to-moderate cerebral and cerebellar atrophy.

Redemonstrated chronic infarct within the ventromedial right
thalamus and right midbrain. There is a small amount of residual
restricted diffusion along the periphery of the thalamic component
of this infarct.

Small chronic cortical/subcortical infarct within the left parietal
lobe.

Chronic small-vessel infarct within the right parietal white matter.

Background moderate multifocal T2/FLAIR hyperintensity within the
cerebral white matter, nonspecific but compatible with chronic small
vessel ischemic disease. To a lesser degree, mild chronic small
vessel ischemic changes are also present within the pons.

The axial SWI sequence is motion degraded. Small foci of chronic
hemosiderin deposition are questioned within the bilateral parietal
lobes.

There is no acute infarct.

No evidence of intracranial mass.

No extra-axial fluid collection.

No midline shift.

Vascular: Expected proximal arterial flow voids.

Skull and upper cervical spine: No focal marrow lesion.

Sinuses/Orbits: Visualized orbits show no acute finding.
Opacification of a left ethmoid air cell.
IMPRESSION: No evidence of acute intracranial abnormality.

Redemonstrated small chronic cortical/subcortical infarct within the
left parietal lobe, chronic small-vessel infarct within the right
parietal lobe and chronic infarct within the ventromedial right
thalamus/right midbrain.

Stable background mild-to-moderate generalized parenchymal atrophy
and moderate cerebral white matter chronic small vessel ischemic
disease. Mild chronic small vessel ischemic changes within the pons
are also stable.

The axial SWI sequence is motion degraded. Small foci of chronic
hemorrhage are questioned within the bilateral parietal lobes
(versus artifact).

## 2020-10-24 IMAGING — CR DG ABDOMEN 1V
1 series · 1 of 1 positions shown · non-contrast
Comparison: None.

CLINICAL DATA: Clearance for MRI.

EXAM:
ABDOMEN - 1 VIEW

[abdomen kub]
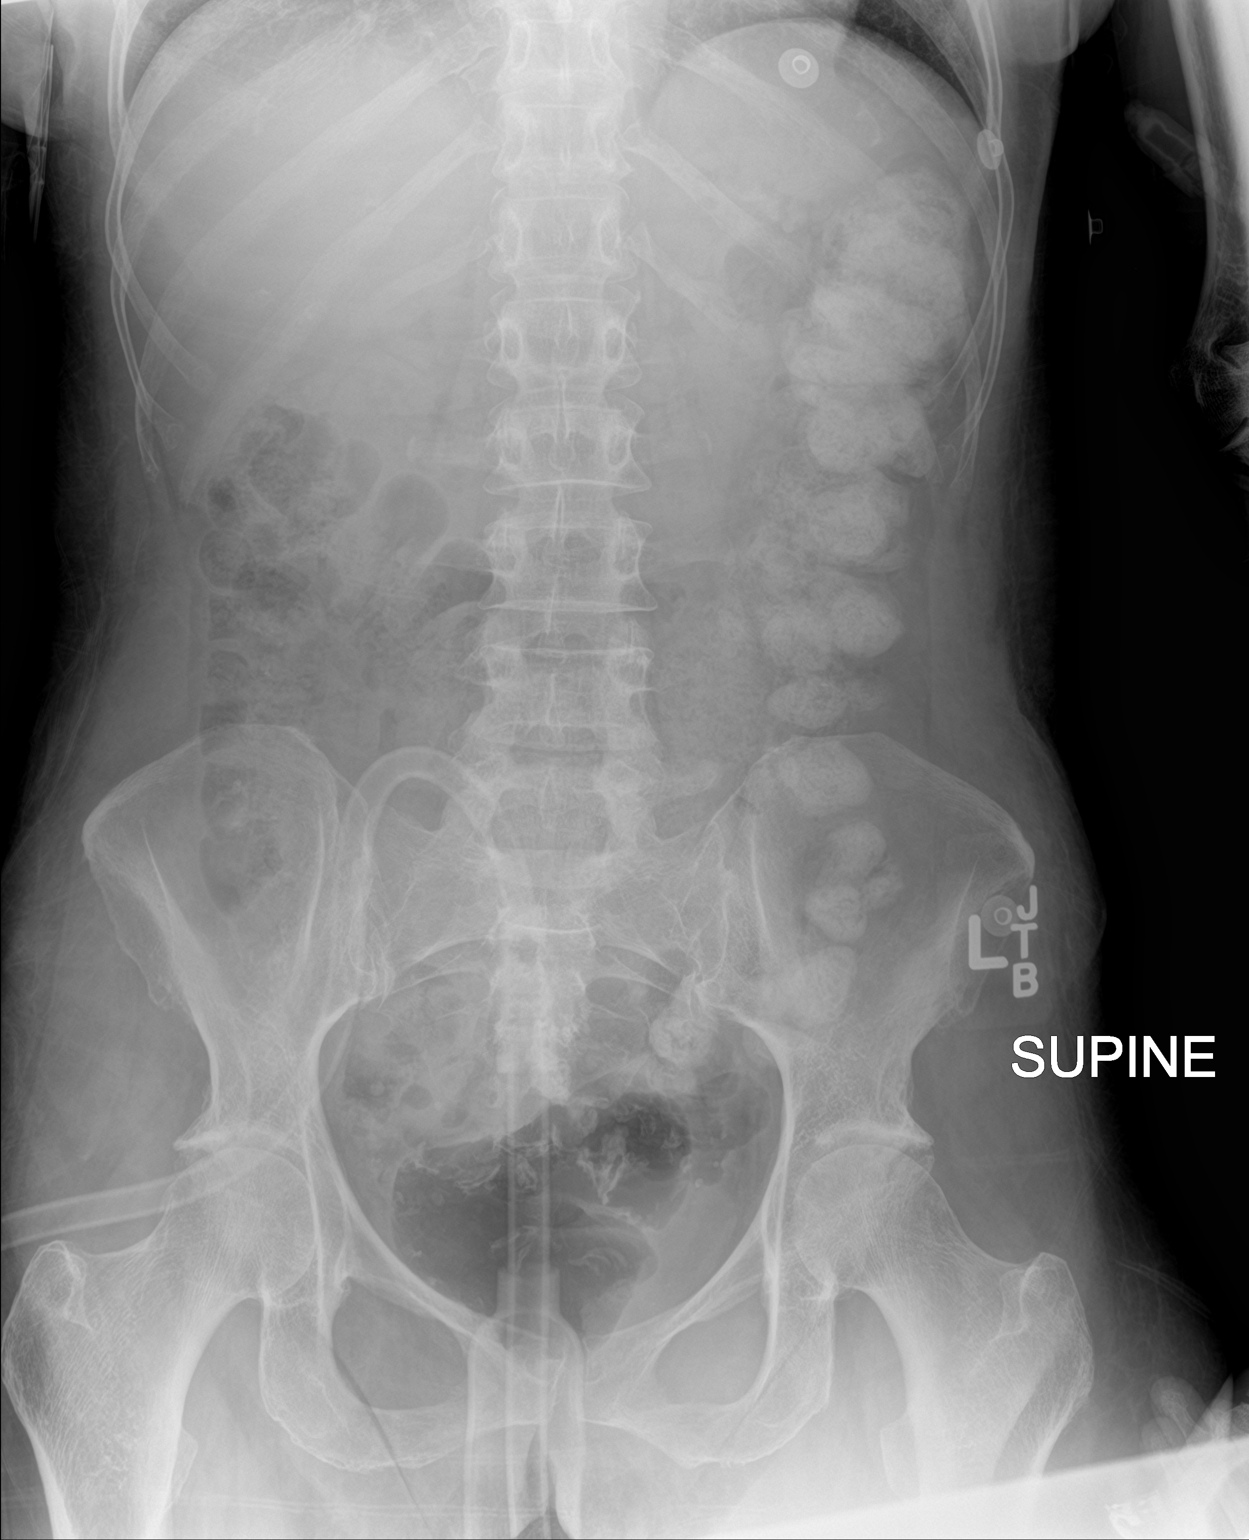

[1 of 1 positions shown; findings below may reference images not displayed]

FINDINGS: The bowel gas pattern is normal. No radio-opaque calculi or other
significant radiographic abnormality are seen.
IMPRESSION: Negative one view abdomen. No radiopaque foreign body to
contraindicate MRI.

## 2020-10-24 MED ORDER — SODIUM CHLORIDE 0.9 % IV SOLN
1.0000 g | INTRAVENOUS | Status: DC
  Administered 2020-10-25 – 2020-10-27 (×3): 1 g via INTRAVENOUS
  Filled 2020-10-24 (×4): qty 10

## 2020-10-24 MED ORDER — INSULIN ASPART 100 UNIT/ML IJ SOLN
0.0000 [IU] | INTRAMUSCULAR | Status: DC
  Administered 2020-10-24: 2 [IU] via SUBCUTANEOUS
  Administered 2020-10-25: 3 [IU] via SUBCUTANEOUS
  Administered 2020-10-25: 4 [IU] via SUBCUTANEOUS
  Administered 2020-10-25: 3 [IU] via SUBCUTANEOUS

## 2020-10-24 MED ORDER — ENOXAPARIN SODIUM 30 MG/0.3ML IJ SOSY
30.0000 mg | PREFILLED_SYRINGE | INTRAMUSCULAR | Status: DC
  Administered 2020-10-25 – 2020-10-28 (×4): 30 mg via SUBCUTANEOUS
  Filled 2020-10-24 (×4): qty 0.3

## 2020-10-24 MED ORDER — ASPIRIN EC 81 MG PO TBEC
81.0000 mg | DELAYED_RELEASE_TABLET | Freq: Every day | ORAL | Status: DC
  Administered 2020-10-25 – 2020-10-28 (×4): 81 mg via ORAL
  Filled 2020-10-24 (×4): qty 1

## 2020-10-24 MED ORDER — SODIUM CHLORIDE 0.9 % IV SOLN
1.0000 g | Freq: Once | INTRAVENOUS | Status: AC
  Administered 2020-10-24: 1 g via INTRAVENOUS
  Filled 2020-10-24: qty 10

## 2020-10-24 MED ORDER — ENOXAPARIN SODIUM 40 MG/0.4ML IJ SOSY
40.0000 mg | PREFILLED_SYRINGE | INTRAMUSCULAR | Status: DC
  Filled 2020-10-24: qty 0.4

## 2020-10-24 NOTE — ED Notes (Signed)
Patient is alert and generally oriented. Offers no complaints at this time. Skin is warm and dry, color WNL for ethnicity. Patient noted to have crackers at bedside and 1 empty package is noted. Patient is free from cough. Antibiotics infusing via pump as per order. Awaiting MRI.

## 2020-10-24 NOTE — ED Provider Notes (Signed)
[facility] EMERGENCY DEPARTMENT Provider Note   CSN: [id number] Arrival date & time: 10/24/20  1130     History No chief complaint on file.   Christine Benitez is a 60 y.o. female.  Patient is a 60 year old female with a history of hypertension, CHF, diabetes and prior thalamic stroke in November 2021 who presents with weakness.  She states that she woke up this morning feeling weak all over.  She cannot say if it is 1 side or the other.  Her family had noticed some slurred speech and maybe some confusion.  Her last known normal was 7 PM yesterday.  On chart review, it was noted that she had had some residual weakness from her prior stroke and left side facial drooping although patient says that she normally does not have any weakness.  She denies any fevers.  No cough or cold symptoms.  No pain anywhere.  No abdominal pain.  No vomiting or diarrhea.      Past Medical History:  Diagnosis Date   Abnormal LFTs    a. 05/2014 -  ALT 52, alk phos 130.   Chronic systolic CHF (congestive heart failure) ([place])    a. Dx 05/2014 - echo at [facility] - EF 30-35%, moderate LVH, no rWMA, mild to moderate MR, moderate PH with PA pressure 60-26mmHg, mild to moderate pericardial effusion. EF 30-35% by cath.   History of blood transfusion 1986   "related to C-section"   Hypertension    Insulin dependent diabetes mellitus    Iron deficiency anemia    NICM (nonischemic cardiomyopathy) ([place])    a. [place] 06/11/14:  minor nonobstructive CAD with mild irregularities in the LAD less than 10-20%, LCx with mild disease less than 20%, no significant disease in the RCA, EF 30-35%.   Pericardial effusion    a. Mild-moderate pericardial effusion by echo at [facility].   Pulmonary hypertension ([place])    a. Moderate PH by echo at [facility]   Tobacco abuse     Patient Active Problem List   Diagnosis Date Noted   Pericardial effusion    Iron deficiency anemia    Chronic systolic CHF (congestive heart  failure) ([place])    Heart failure with reduced ejection fraction ([place]) 07/18/2020   History of CVA (cerebrovascular accident) 07/18/2020   Nonischemic cardiomyopathy ([place]) 07/18/2020   Type 2 diabetes mellitus with complication, with long-term current use of insulin ([place]) 07/18/2020   Tobacco use 07/18/2020   Mixed hyperlipidemia 07/18/2020   Abnormal echocardiogram 07/18/2020   Resides in long term care facility 06/20/2020   Protein-calorie malnutrition, severe 03/24/2020   Pain    Altered mental status    DNR (do not resuscitate)    DNI (do not intubate)    Encounter for hospice care discussion    Goals of care, counseling/discussion    Palliative care by specialist    AKI (acute kidney injury) ([place])    Cardiogenic shock ([place]) 03/14/2020   Acute urinary retention 03/13/2020   Chronic cholecystitis with calculus 03/12/2020   Cerebral thrombosis with cerebral infarction 03/12/2020   Abnormal LFTs    Hypertension    NICM (nonischemic cardiomyopathy) ([place])    Pulmonary hypertension ([place])    Tobacco abuse 06/10/2014   Diabetes mellitus ([place]) 06/10/2014   Family history of premature CAD 06/10/2014   History of blood transfusion 1986    Past Surgical History:  Procedure Laterality Date   APPENDECTOMY  ~ 2003   [place]  LEFT HEART CATHETERIZATION WITH CORONARY ANGIOGRAM N/A 06/11/2014   Procedure: LEFT HEART CATHETERIZATION WITH CORONARY ANGIOGRAM;  Surgeon: Peter M Martinique, MD;  Location: [facility] CATH LAB;  Service: Cardiovascular;  Laterality: N/A;     OB History   No obstetric history on file.     Family History  Problem Relation Age of Onset   Coronary artery disease Mother    Diabetes Mother    Lupus Mother    Stroke Mother    Lung cancer Father     Social History   Tobacco Use   Smoking status: Former    Packs/day: 0.50    Years: 22.00    Pack years: 11.00    Types: Cigarettes    Quit date: 03/26/2015    Years since quitting: 5.5   Smokeless  tobacco: Never  Substance Use Topics   Alcohol use: No   Drug use: No    Home Medications Prior to Admission medications   Medication Sig Start Date End Date Taking? Authorizing Provider  albuterol (VENTOLIN HFA) 108 (90 Base) MCG/ACT inhaler Inhale 2 puffs into the lungs every 6 (six) hours as needed for wheezing or shortness of breath. 03/07/20   [provider]  aspirin 81 MG EC tablet TAKE 1 TABLET (81 MG TOTAL) BY MOUTH DAILY. 09/10/14   Martinique, Peter M, MD  atorvastatin (LIPITOR) 40 MG tablet Take 1 tablet (40 mg total) by mouth every evening. 03/26/20   Sanjuan Dame, MD  clopidogrel (PLAVIX) 75 MG tablet Take 1 tablet (75 mg total) by mouth daily. 03/27/20   Sanjuan Dame, MD  insulin glargine (LANTUS) 100 UNIT/ML injection Inject 0.18 mLs (18 Units total) into the skin at bedtime. 03/26/20   Sanjuan Dame, MD  insulin lispro (HUMALOG) 100 UNIT/ML KwikPen Inject 5 Units into the skin 3 (three) times daily before meals. 07/08/20   [provider]  ivabradine (CORLANOR) 5 MG TABS tablet Take 0.5 tablets (2.5 mg total) by mouth 2 (two) times daily with a meal. 10/16/20   Lyda Jester M, PA-C  metFORMIN (GLUCOPHAGE) 500 MG tablet Take 500 mg by mouth 2 (two) times daily. 01/06/20   [provider]  metoprolol succinate (TOPROL XL) 25 MG 24 hr tablet Take 0.5 tablets (12.5 mg total) by mouth daily. 10/08/20   Tobb, Kardie, DO  mirtazapine (REMERON SOL-TAB) 15 MG disintegrating tablet Take 0.5 tablets (7.5 mg total) by mouth at bedtime. Patient taking differently: Take 15 mg by mouth at bedtime. 1 & 1/2 tablet by mouth 03/26/20   Sanjuan Dame, MD  ondansetron (ZOFRAN) 4 MG tablet Take by mouth.    [provider]  sodium zirconium cyclosilicate (LOKELMA) 10 g PACK packet Take 10 g by mouth 2 (two) times daily. 03/26/20   Sanjuan Dame, MD  traMADol (ULTRAM) 50 MG tablet Take 50 mg by mouth every 6 (six) hours as needed.    [provider]    Allergies    Patient has no known allergies.  Review of Systems   Review of Systems  Constitutional:  Negative for chills, diaphoresis, fatigue and fever.  HENT:  Negative for congestion, rhinorrhea and sneezing.   Eyes: Negative.   Respiratory:  Negative for cough, chest tightness and shortness of breath.   Cardiovascular:  Negative for chest pain and leg swelling.  Gastrointestinal:  Negative for abdominal pain, blood in stool, diarrhea, nausea and vomiting.  Genitourinary:  Negative for difficulty urinating, flank pain, frequency and hematuria.  Musculoskeletal:  Negative for arthralgias  and back pain.  Skin:  Negative for rash.  Neurological:  Positive for weakness. Negative for dizziness, speech difficulty, numbness and headaches.   Physical Exam Updated Vital Signs BP 114/75   Pulse 90   Temp 98.7 F (37.1 C) (Oral)   Resp 20   Ht $R'5\' 6"'nE$  (1.676 m)   Wt 44.5 kg   SpO2 100%   BMI 15.82 kg/m   Physical Exam Constitutional:      Appearance: She is well-developed.  HENT:     Head: Normocephalic and atraumatic.  Eyes:     Pupils: Pupils are equal, round, and reactive to light.  Cardiovascular:     Rate and Rhythm: Normal rate and regular rhythm.     Heart sounds: Normal heart sounds.  Pulmonary:     Effort: Pulmonary effort is normal. No respiratory distress.     Breath sounds: Normal breath sounds. No wheezing or rales.  Chest:     Chest wall: No tenderness.  Abdominal:     General: Bowel sounds are normal.     Palpations: Abdomen is soft.     Tenderness: There is no abdominal tenderness. There is no guarding or rebound.  Musculoskeletal:        General: Normal range of motion.     Cervical back: Normal range of motion and neck supple.  Lymphadenopathy:     Cervical: No cervical adenopathy.  Skin:    General: Skin is warm and dry.     Findings: No rash.  Neurological:     Mental Status: She is alert and oriented to person, place, and time.      Comments: Patient has some right arm drift and some shakiness on arm elevation.  She seems to have normal strength in her lower extremities.  She has a little bit of left-sided facial drooping.  I do not appreciate any visual field deficits.  Sensation is intact to light touch in all extremities.    ED Results / Procedures / Treatments   Labs (all labs ordered are listed, but only abnormal results are displayed) Labs Reviewed  CBC - Abnormal; Notable for the following components:      Result Value   WBC 11.4 (*)    All other components within normal limits  DIFFERENTIAL - Abnormal; Notable for the following components:   Neutro Abs 8.4 (*)    All other components within normal limits  COMPREHENSIVE METABOLIC PANEL - Abnormal; Notable for the following components:   Potassium 5.3 (*)    Glucose, Bld 66 (*)    BUN 21 (*)    Creatinine, Ser 1.30 (*)    Total Protein 8.2 (*)    GFR, Estimated 48 (*)    All other components within normal limits  URINALYSIS, ROUTINE W REFLEX MICROSCOPIC - Abnormal; Notable for the following components:   Glucose, UA 150 (*)    Nitrite POSITIVE (*)    Leukocytes,Ua MODERATE (*)    Bacteria, UA FEW (*)    All other components within normal limits  RESP PANEL BY RT-PCR (FLU A&B, COVID) ARPGX2  URINE CULTURE  ETHANOL  PROTIME-INR  APTT  RAPID URINE DRUG SCREEN, HOSP PERFORMED  I-STAT CHEM 8, ED  I-STAT BETA HCG BLOOD, ED ([facility], [facility], [facility] ONLY)    EKG EKG Interpretation  Date/Time:  Friday October 24 2020 11:42:17 EDT Ventricular Rate:  99 PR Interval:  144 QRS Duration: 74 QT Interval:  327 QTC Calculation: 420 R Axis:   75 Text Interpretation: Sinus  rhythm LAE, consider biatrial enlargement Probable left ventricular hypertrophy Nonspecific T abnrm, anterolateral leads since last tracing no significant change Confirmed by Malvin Johns [phone]) on 10/24/2020 12:10:50 PM  Radiology DG Chest 1 View  Result Date: 10/24/2020 CLINICAL DATA:   Confused.  Clearance for MRI. EXAM: CHEST  1 VIEW COMPARISON:  One-view chest x-ray 03/14/2020 FINDINGS: The heart size and mediastinal contours are within normal limits. Both lungs are clear. The visualized skeletal structures are unremarkable. IMPRESSION: No radiopaque foreign body. No acute cardiopulmonary disease. Electronically Signed   By: San Morelle M.D.   On: 10/24/2020 15:56   DG Abdomen 1 View  Result Date: 10/24/2020 CLINICAL DATA:  Clearance for MRI. EXAM: ABDOMEN - 1 VIEW COMPARISON:  None. FINDINGS: The bowel gas pattern is normal. No radio-opaque calculi or other significant radiographic abnormality are seen. IMPRESSION: Negative one view abdomen. No radiopaque foreign body to contraindicate MRI. Electronically Signed   By: San Morelle M.D.   On: 10/24/2020 15:57   CT HEAD WO CONTRAST  Result Date: 10/24/2020 CLINICAL DATA:  Neuro deficit, acute, stroke suspected. Additional history provided: Generalized weakness today, inability to stand. History of prior stroke. EXAM: CT HEAD WITHOUT CONTRAST TECHNIQUE: Contiguous axial images were obtained from the base of the skull through the vertex without intravenous contrast. COMPARISON:  CT angiogram head 03/12/2020.  Brain MRI 03/11/2020. FINDINGS: Brain: Stable cerebral and cerebellar atrophy. Redemonstrated small chronic cortical/subcortical infarct within the left parietal lobe. Redemonstrated remote lacunar infarct within the right parietal white matter (series 3, image 18). Mild-to-moderate ill-defined hypoattenuation within the cerebral white matter is nonspecific, but compatible with chronic small vessel ischemic disease. Known chronic infarct within the right thalamus and midbrain, better appreciated on the brain MRI of 03/11/2020 (acute at that time). There is no acute intracranial hemorrhage. No acute demarcated cortical infarct. No extra-axial fluid collection. No evidence of intracranial mass. No midline shift.  Vascular: No hyperdense vessel.  Atherosclerotic calcifications. Skull: Normal. Negative for fracture or focal lesion. Sinuses/Orbits: Visualized orbits show no acute finding. Opacification of the left ethmoid air cell. Background trace bilateral ethmoid sinus mucosal thickening. IMPRESSION: No evidence of acute intracranial abnormality. Redemonstrated chronic infarcts within the cortical/subcortical left parietal lobe, right parietal white matter and right thalamus/midbrain. Stable background mild-to-moderate cerebral white matter chronic small vessel ischemic disease. Stable cerebral and cerebellar atrophy. Mild paranasal sinus disease at the imaged levels, as described. Electronically Signed   By: Kellie Simmering DO   On: 10/24/2020 13:55    Procedures Procedures   Medications Ordered in ED Medications  cefTRIAXone (ROCEPHIN) 1 g in sodium chloride 0.9 % 100 mL IVPB (has no administration in time range)    ED Course  I have reviewed the triage vital signs and the nursing notes.  Pertinent labs & imaging results that were available during my care of the patient were reviewed by me and considered in my medical decision making (see chart for details).    MDM Rules/Calculators/A&P                          Patient is a 60 year old female who presents with confusion and weakness.  Her weakness seems to be generalized.  She has a little bit of facial drooping which per chart review seems to be old.  She has a little bit of a drift in her right arm and its unclear whether this is old or new.  Her CT scan does not show any  acute abnormality.  Her labs show mild elevation in WBC count.  Her creatinine is mildly elevated.  Her urine appears to be infected this could be the etiology of her symptoms.  She was given a dose of IV Rocephin and a culture was ordered.  She has an MRI pending.  At this point she is out of the window for tPA and she does not have any suggestions of an LVO.  Will admit to  hospitalist Final Clinical Impression(s) / ED Diagnoses Final diagnoses:  Urinary tract infection without hematuria, site unspecified  Confusion    Rx / DC Orders ED Discharge Orders     None        Malvin Johns, MD 10/24/20 1642

## 2020-10-24 NOTE — ED Notes (Signed)
Patient transported to MRI 

## 2020-10-24 NOTE — H&P (Signed)
[facility] History and Physical  Christine Benitez MKL:[id number] DOB: [birth year] DOA: 10/24/2020  Referring physician:  PCP: Claiborne Billings, MD   Chief Complaint: Fatigue, unable to move  HPI: 60 year old BF (from long-term care facility) PMHx HTN,, severe chronic systolic CHF (EF 20 to 69%), NICM, pulmonary HTN, DM type II uncontrolled with hyperglycemia, thalamic stroke in November 2021, tobacco abuse, chronic cholecystitis with calculus,  Presents with weakness.  She states that she woke up this morning feeling weak all over.  She cannot say if it is 1 side or the other.  Her family had noticed some slurred speech and maybe some confusion.  Her last known normal was 7 PM yesterday.  On chart review, it was noted that she had had some residual weakness from her prior stroke and left side facial drooping although patient says that she normally does not have any weakness.  She denies any fevers.  No cough or cold symptoms.  No pain anywhere.  No abdominal pain.  No vomiting or diarrhea.   Patient states has not been eating, and she has been feeling fatigued, weak.  Brother called 911 because patient was unable to eat/move around.   Review of Systems:  Covid vaccination; vaccinated 2/3  Constitutional:  Positive weight loss, night sweats, Fevers, chills, fatigue.  HEENT:  No headaches, Difficulty swallowing,Tooth/dental problems,Sore throat,  No sneezing, itching, ear ache, nasal congestion, post nasal drip, Positive extremely hard of hearing Cardio-vascular:  No chest pain, Orthopnea, PND, swelling in lower extremities, anasarca, dizziness, palpitations  GI:  No heartburn, indigestion, abdominal pain, nausea, vomiting, diarrhea, change in bowel habits, loss of appetite  Resp:  No shortness of breath with exertion or at rest. No excess mucus, no productive cough, No non-productive cough, No coughing up of blood.No change in color of mucus.No wheezing.No chest wall deformity  Skin:   no rash or lesions.  GU:  no dysuria, change in color of urine, no urgency or frequency. No flank pain.  Musculoskeletal:  No joint pain or swelling. No decreased range of motion. No back pain.  Psych:  No change in mood or affect. No depression or anxiety. No memory loss.   Past Medical History:  Diagnosis Date   Abnormal LFTs    a. 05/2014 -  ALT 52, alk phos 130.   Chronic systolic CHF (congestive heart failure) ([place])    a. Dx 05/2014 - echo at [facility] - EF 30-35%, moderate LVH, no rWMA, mild to moderate MR, moderate PH with PA pressure 60-63mHg, mild to moderate pericardial effusion. EF 30-35% by cath.   History of blood transfusion 1986   "related to C-section"   Hypertension    Insulin dependent diabetes mellitus    Iron deficiency anemia    N[place]mic [place]) [place]    a. [place]:  minor nonobstructive CAD with mild irregularities in the LAD less than 10-20%, LCx with mild disease less than 20%, no significant disease in the RCA, EF 30-35%.   Pericardial effusion    [facility]   [place]    [facility]   Tobacco abuse    Past Surgical History:  Procedure Laterality Date   APPENDECTOMY  ~ 2003   CCochraneWITH CORONARY ANGIOGRAM N/A 06/11/2014   Procedure: LEFT HEART CATHETERIZATION WITH CORONARY ANGIOGRAM;  Surgeon: Peter M JMartinique MD;  [facility];  Service: Cardiovascular;  Laterality: N/A;   Social History:  reports that she quit smoking about 5 years ago. Her smoking use included cigarettes. She has a 11.00 pack-year smoking history. She has never used smokeless tobacco. She reports that she does not drink alcohol and does not use drugs.  No Known Allergies  Family History  Problem Relation Age of Onset   Coronary artery disease Mother    Diabetes Mother    Lupus Mother    Stroke Mother    Lung cancer  Father     Prior to Admission medications   Medication Sig Start Date End Date Taking? Authorizing Provider  albuterol (VENTOLIN HFA) 108 (90 Base) MCG/ACT inhaler Inhale 2 puffs into the lungs every 6 (six) hours as needed for wheezing or shortness of breath. 03/07/20   [provider]  aspirin 81 MG EC tablet TAKE 1 TABLET (81 MG TOTAL) BY MOUTH DAILY. 09/10/14   Martinique, Peter M, MD  atorvastatin (LIPITOR) 40 MG tablet Take 1 tablet (40 mg total) by mouth every evening. 03/26/20   Sanjuan Dame, MD  clopidogrel (PLAVIX) 75 MG tablet Take 1 tablet (75 mg total) by mouth daily. 03/27/20   Sanjuan Dame, MD  insulin glargine (LANTUS) 100 UNIT/ML injection Inject 0.18 mLs (18 Units total) into the skin at bedtime. 03/26/20   Sanjuan Dame, MD  insulin lispro (HUMALOG) 100 UNIT/ML KwikPen Inject 5 Units into the skin 3 (three) times daily before meals. 07/08/20   [provider]  ivabradine (CORLANOR) 5 MG TABS tablet Take 0.5 tablets (2.5 mg total) by mouth 2 (two) times daily with a meal. 10/16/20   Lyda Jester M, PA-C  metFORMIN (GLUCOPHAGE) 500 MG tablet Take 500 mg by mouth 2 (two) times daily. 01/06/20   [provider]  metoprolol succinate (TOPROL XL) 25 MG 24 hr tablet Take 0.5 tablets (12.5 mg total) by mouth daily. 10/08/20   Tobb, Kardie, DO  mirtazapine (REMERON SOL-TAB) 15 MG disintegrating tablet Take 0.5 tablets (7.5 mg total) by mouth at bedtime. Patient taking differently: Take 15 mg by mouth at bedtime. 1 & 1/2 tablet by mouth 03/26/20   Sanjuan Dame, MD  ondansetron (ZOFRAN) 4 MG tablet Take by mouth.    [provider]  sodium zirconium cyclosilicate (LOKELMA) 10 g PACK packet Take 10 g by mouth 2 (two) times daily. 03/26/20   Sanjuan Dame, MD  traMADol (ULTRAM) 50 MG tablet Take 50 mg by mouth every 6 (six) hours as needed.    [provider]     Consultants:    Procedures/Significant Events:  6/17 MRI brain  W0 contrast:No evidence of acute intracranial abnormality.   Redemonstrated small chronic cortical/subcortical infarct within the left parietal lobe, chronic small-vessel infarct within the right parietal lobe and chronic infarct within the ventromedial right thalamus/right midbrain.   Stable background mild-to-moderate generalized parenchymal atrophy and moderate cerebral white matter chronic small vessel ischemic disease. Mild chronic small vessel ischemic changes within the pons are also stable.   The axial SWI sequence is motion degraded. Small foci of chronic hemorrhage are questioned within the bilateral parietal lobes (versus artifact).  I have personally reviewed and interpreted all radiology studies and my findings are as above.   VENTILATOR SETTINGS:    Cultures 6/17 urine pending 6/17 blood pending  Antimicrobials:   Anti-infectives (From admission, onward)    Start     Dose/Rate Route Frequency Ordered Stop   10/25/20 1700  cefTRIAXone (ROCEPHIN) 1 g in sodium chloride 0.9 %  100 mL IVPB        1 g 200 mL/hr over 30 Minutes Intravenous Every 24 hours 10/24/20 1929     10/24/20 1630  cefTRIAXone (ROCEPHIN) 1 g in sodium chloride 0.9 % 100 mL IVPB        1 g 200 mL/hr over 30 Minutes Intravenous  Once 10/24/20 1623 10/24/20 1934         Devices    LINES / TUBES:      Continuous Infusions:  cefTRIAXone (ROCEPHIN)  IV 1 g (10/24/20 1701)    Physical Exam: Vitals:   10/24/20 1615 10/24/20 1630 10/24/20 1645 10/24/20 1700  BP: 120/79 (!) 112/95 (!) 123/94 118/75  Pulse: 86 92 94   Resp: _0 Temp:      TempSrc:      SpO2: 100% 100% 100%   Weight:      Height:        Wt Readings from Last 3 Encounters:  10/24/20 44.5 kg  10/16/20 44.5 kg  10/08/20 44.7 kg    General: A/O x4, No acute respiratory distress, cachectic Eyes: negative scleral hemorrhage, negative anisocoria, negative icterus ENT: Negative Runny nose, negative gingival  bleeding, Positive hard of hearing Neck:  Negative scars, masses, torticollis, lymphadenopathy, JVD Lungs: Clear to auscultation bilaterally without wheezes or crackles Cardiovascular: Regular rate and rhythm without murmur gallop or rub normal S1 and S2 Abdomen: negative abdominal pain, nondistended, positive soft, bowel sounds, no rebound, no ascites, no appreciable mass Extremities: No significant cyanosis, clubbing, or edema bilateral lower extremities Skin: Negative rashes, lesions, ulcers Psychiatric:  Negative depression, negative anxiety, negative fatigue, negative mania  Central nervous system:  Cranial nerves II through XII intact, tongue/uvula midline, all extremities muscle strength 5/5, sensation intact throughout,  negative dysarthria, negative expressive aphasia, negative receptive aphasia.        Labs on Admission:  Basic Metabolic Panel: Recent Labs  Lab 10/24/20 1340  NA 139  K 5.3*  CL 107  CO2 23  GLUCOSE 66*  BUN 21*  CREATININE 1.30*  CALCIUM 10.0   Liver Function Tests: Recent Labs  Lab 10/24/20 1340  AST 33  ALT 27  ALKPHOS 80  BILITOT 0.8  PROT 8.2*  ALBUMIN 3.9   No results for input(s): LIPASE, AMYLASE in the last 168 hours. No results for input(s): AMMONIA in the last 168 hours. CBC: Recent Labs  Lab 10/24/20 1340  WBC 11.4*  NEUTROABS 8.4*  HGB 12.4  HCT 39.5  MCV 97.3  PLT 361   Cardiac Enzymes: No results for input(s): CKTOTAL, CKMB, CKMBINDEX, TROPONINI in the last 168 hours.  BNP (last 3 results) No results for input(s): BNP in the last 8760 hours.  ProBNP (last 3 results) No results for input(s): PROBNP in the last 8760 hours.  CBG: No results for input(s): GLUCAP in the last 168 hours.  Radiological Exams on Admission: DG Chest 1 View  Result Date: 10/24/2020 CLINICAL DATA:  Confused.  Clearance for MRI. EXAM: CHEST  1 VIEW COMPARISON:  One-view chest x-ray 03/14/2020 FINDINGS: The heart size and mediastinal contours  are within normal limits. Both lungs are clear. The visualized skeletal structures are unremarkable. IMPRESSION: No radiopaque foreign body. No acute cardiopulmonary disease. Electronically Signed   By: San Morelle M.D.   On: 10/24/2020 15:56   DG Abdomen 1 View  Result Date: 10/24/2020 CLINICAL DATA:  Clearance for MRI. EXAM: ABDOMEN - 1 VIEW COMPARISON:  None. FINDINGS: The bowel gas pattern  is normal. No radio-opaque calculi or other significant radiographic abnormality are seen. IMPRESSION: Negative one view abdomen. No radiopaque foreign body to contraindicate MRI. Electronically Signed   By: San Morelle M.D.   On: 10/24/2020 15:57   CT HEAD WO CONTRAST  Result Date: 10/24/2020 CLINICAL DATA:  Neuro deficit, acute, stroke suspected. Additional history provided: Generalized weakness today, inability to stand. History of prior stroke. EXAM: CT HEAD WITHOUT CONTRAST TECHNIQUE: Contiguous axial images were obtained from the base of the skull through the vertex without intravenous contrast. COMPARISON:  CT angiogram head 03/12/2020.  Brain MRI 03/11/2020. FINDINGS: Brain: Stable cerebral and cerebellar atrophy. Redemonstrated small chronic cortical/subcortical infarct within the left parietal lobe. Redemonstrated remote lacunar infarct within the right parietal white matter (series 3, image 18). Mild-to-moderate ill-defined hypoattenuation within the cerebral white matter is nonspecific, but compatible with chronic small vessel ischemic disease. Known chronic infarct within the right thalamus and midbrain, better appreciated on the brain MRI of 03/11/2020 (acute at that time). There is no acute intracranial hemorrhage. No acute demarcated cortical infarct. No extra-axial fluid collection. No evidence of intracranial mass. No midline shift. Vascular: No hyperdense vessel.  Atherosclerotic calcifications. Skull: Normal. Negative for fracture or focal lesion. Sinuses/Orbits: Visualized orbits  show no acute finding. Opacification of the left ethmoid air cell. Background trace bilateral ethmoid sinus mucosal thickening. IMPRESSION: No evidence of acute intracranial abnormality. Redemonstrated chronic infarcts within the cortical/subcortical left parietal lobe, right parietal white matter and right thalamus/midbrain. Stable background mild-to-moderate cerebral white matter chronic small vessel ischemic disease. Stable cerebral and cerebellar atrophy. Mild paranasal sinus disease at the imaged levels, as described. Electronically Signed   By: Kellie Simmering DO   On: 10/24/2020 13:55    EKG: Independently reviewed.   Assessment/Plan Principal Problem:   Acute lower UTI Active Problems:   Tobacco abuse   Hypertension   NICM (nonischemic cardiomyopathy) (HCC)   Pulmonary hypertension (HCC)   Chronic cholecystitis with calculus   History of CVA (cerebrovascular accident)   Type 2 diabetes mellitus with complication, with long-term current use of insulin (HCC)   Chronic systolic CHF (congestive heart failure) ([place])  Acute lower UTI -Urinalysis is dirty.  Continue empiric antibiotic urine cultures pending  Failure to thrive -Patient admits that she is suffering from anorexia most likely secondary to her severe chronic systolic CHF. - Hospice care consult: Consider advanced care directives to not return to hospital secondary to weakness brought on by anorexia  vs hospice  Chronic systolic CHF/NICM/pulmonary HTN -Echocardiogram 03/12/2020 EF 20 to 25% - Older females with worsening cardiac function can present with fatigue, anorexia vs chest pain - Trend troponin - Echocardiogram pending: If shows cardiac worsening would support palliative care speaking more intensely concerning hospice care.  Even without worsening cardiac function anorexia/weight loss poor prognostic indicator. - Strict in and out - Daily weight  Hx CVA -MRI negative for acute stroke.  DM type II with complication,  with long-term use of insulin -03/12/2020 hemoglobin A1c= 7.3 - Very sensitive SSI      Code Status: DNR (DVT Prophylaxis: Lovenox Family Communication:   Status is: Inpatient    Dispo: The patient is from: Home              Anticipated d/c is to: Home vs assisted living facility              Anticipated d/c date is: 6/20              Patient  currently unstable     Data Reviewed: Care during the described time interval was provided by me .  I have reviewed this patient's available data, including medical history, events of note, physical examination, and all test results as part of my evaluation.   The patient is critically ill with multiple organ systems failure and requires high complexity decision making for assessment and support, frequent evaluation and titration of therapies, application of advanced monitoring technologies and extensive interpretation of multiple databases. Critical Care Time devoted to patient care services described in this note  Time spent: 70 minutes   Kia Stavros, [facility]

## 2020-10-24 NOTE — ED Notes (Signed)
Attempted report to floor. Number given to Diplomatic Services operational officer. Nurse not available.

## 2020-10-24 NOTE — ED Triage Notes (Signed)
Pt to ED from home by EMS for evaluation of stroke like symptoms. Last known well t ime 7pm yesterday. Pt presents with slurred speech, confusion, weakness. Family unable to give info to EMS. Pt has Hx stroke- last 49mo ago.

## 2020-10-24 NOTE — ED Provider Notes (Signed)
Care of the patient assumed at the change of shift. Spoke with Dr. Joseph Art, Hospitalist, who will evaluate for admission.    Pollyann Savoy, MD 10/24/20 [phone]

## 2020-10-25 ENCOUNTER — Other Ambulatory Visit: Payer: Self-pay

## 2020-10-25 ENCOUNTER — Observation Stay (HOSPITAL_COMMUNITY): Payer: Medicaid Other

## 2020-10-25 DIAGNOSIS — R410 Disorientation, unspecified: Secondary | ICD-10-CM

## 2020-10-25 DIAGNOSIS — K8010 Calculus of gallbladder with chronic cholecystitis without obstruction: Secondary | ICD-10-CM

## 2020-10-25 DIAGNOSIS — Z515 Encounter for palliative care: Secondary | ICD-10-CM

## 2020-10-25 DIAGNOSIS — I5021 Acute systolic (congestive) heart failure: Secondary | ICD-10-CM

## 2020-10-25 DIAGNOSIS — A498 Other bacterial infections of unspecified site: Secondary | ICD-10-CM

## 2020-10-25 DIAGNOSIS — Z66 Do not resuscitate: Secondary | ICD-10-CM

## 2020-10-25 DIAGNOSIS — Z7189 Other specified counseling: Secondary | ICD-10-CM

## 2020-10-25 LAB — COMPREHENSIVE METABOLIC PANEL
ALT: 22 U/L (ref 0–44)
AST: 28 U/L (ref 15–41)
Albumin: 3.3 g/dL — ABNORMAL LOW (ref 3.5–5.0)
Alkaline Phosphatase: 71 U/L (ref 38–126)
Anion gap: 11 (ref 5–15)
BUN: 22 mg/dL — ABNORMAL HIGH (ref 6–20)
CO2: 22 mmol/L (ref 22–32)
Calcium: 9.4 mg/dL (ref 8.9–10.3)
Chloride: 105 mmol/L (ref 98–111)
Creatinine, Ser: 1.25 mg/dL — ABNORMAL HIGH (ref 0.44–1.00)
GFR, Estimated: 50 mL/min — ABNORMAL LOW (ref >60)
Glucose, Bld: 265 mg/dL — ABNORMAL HIGH (ref 70–99)
Potassium: 4.8 mmol/L (ref 3.5–5.1)
Sodium: 138 mmol/L (ref 135–145)
Total Bilirubin: 1.1 mg/dL (ref 0.3–1.2)
Total Protein: 7.2 g/dL (ref 6.5–8.1)

## 2020-10-25 LAB — MAGNESIUM: Magnesium: 2.2 mg/dL (ref 1.7–2.4)

## 2020-10-25 LAB — ECHOCARDIOGRAM COMPLETE
Area-P 1/2: 2.13 cm2
Height: 66 in
S' Lateral: 2.3 cm
Weight: 1568 oz

## 2020-10-25 LAB — CBC WITH DIFFERENTIAL/PLATELET
Abs Immature Granulocytes: 0.02 10*3/uL (ref 0.00–0.07)
Basophils Absolute: 0.1 10*3/uL (ref 0.0–0.1)
Basophils Relative: 1 %
Eosinophils Absolute: 0.1 10*3/uL (ref 0.0–0.5)
Eosinophils Relative: 1 %
HCT: 35.3 % — ABNORMAL LOW (ref 36.0–46.0)
Hemoglobin: 11.4 g/dL — ABNORMAL LOW (ref 12.0–15.0)
Immature Granulocytes: 0 %
Lymphocytes Relative: 27 %
Lymphs Abs: 2.1 10*3/uL (ref 0.7–4.0)
MCH: 31.3 pg (ref 26.0–34.0)
MCHC: 32.3 g/dL (ref 30.0–36.0)
MCV: 97 fL (ref 80.0–100.0)
Monocytes Absolute: 0.6 10*3/uL (ref 0.1–1.0)
Monocytes Relative: 7 %
Neutro Abs: 5 10*3/uL (ref 1.7–7.7)
Neutrophils Relative %: 64 %
Platelets: 292 10*3/uL (ref 150–400)
RBC: 3.64 MIL/uL — ABNORMAL LOW (ref 3.87–5.11)
RDW: 13.1 % (ref 11.5–15.5)
WBC: 7.9 10*3/uL (ref 4.0–10.5)
nRBC: 0 % (ref 0.0–0.2)

## 2020-10-25 LAB — GLUCOSE, CAPILLARY
Glucose-Capillary: 278 mg/dL — ABNORMAL HIGH (ref 70–99)
Glucose-Capillary: 79 mg/dL (ref 70–99)
Glucose-Capillary: 328 mg/dL — ABNORMAL HIGH (ref 70–99)
Glucose-Capillary: 273 mg/dL — ABNORMAL HIGH (ref 70–99)
Glucose-Capillary: 220 mg/dL — ABNORMAL HIGH (ref 70–99)

## 2020-10-25 LAB — PHOSPHORUS: Phosphorus: 3.3 mg/dL (ref 2.5–4.6)

## 2020-10-25 MED ORDER — ACETAMINOPHEN 325 MG PO TABS
650.0000 mg | ORAL_TABLET | ORAL | Status: DC | PRN
  Administered 2020-10-25: 650 mg via ORAL
  Filled 2020-10-25: qty 2

## 2020-10-25 MED ORDER — ACETAMINOPHEN 325 MG PO TABS
650.0000 mg | ORAL_TABLET | Freq: Four times a day (QID) | ORAL | Status: DC | PRN
  Administered 2020-10-25: 650 mg via ORAL
  Filled 2020-10-25: qty 2

## 2020-10-25 MED ORDER — INSULIN ASPART 100 UNIT/ML IJ SOLN
0.0000 [IU] | Freq: Three times a day (TID) | INTRAMUSCULAR | Status: DC
  Administered 2020-10-26 (×2): 3 [IU] via SUBCUTANEOUS
  Administered 2020-10-26: 4 [IU] via SUBCUTANEOUS
  Administered 2020-10-27: 1 [IU] via SUBCUTANEOUS
  Administered 2020-10-27: 5 [IU] via SUBCUTANEOUS

## 2020-10-25 MED ORDER — INSULIN ASPART 100 UNIT/ML IJ SOLN
0.0000 [IU] | Freq: Every day | INTRAMUSCULAR | Status: DC
  Administered 2020-10-25: 2 [IU] via SUBCUTANEOUS
  Administered 2020-10-26: 4 [IU] via SUBCUTANEOUS

## 2020-10-25 MED ORDER — DRONABINOL 2.5 MG PO CAPS
5.0000 mg | ORAL_CAPSULE | Freq: Two times a day (BID) | ORAL | Status: DC
  Administered 2020-10-25 – 2020-10-28 (×7): 5 mg via ORAL
  Filled 2020-10-25 (×7): qty 2

## 2020-10-25 NOTE — Progress Notes (Signed)
  Echocardiogram 2D Echocardiogram has been performed.  Christine Benitez 10/25/2020, 10:21 AM

## 2020-10-25 NOTE — Evaluation (Signed)
Physical Therapy Evaluation Patient Details Name: Christine Benitez MRN: [id number] DOB: 1961/03/26 Today's Date: 10/25/2020   History of Present Illness  60 y.o. female presents to [facility] ED on 10/24/2020 with weakness, slurred speech, confusion. CT and MRI negative. PMH includes HTN,, severe chronic systolic CHF (EF 20 to 25%), NICM, pulmonary HTN, DM type II, thalamic stroke in November 2021, tobacco abuse, chronic cholecystitis with calculus.  Clinical Impression  Pt presents to PT with no reported deficits compared to baseline. Pt is able to ambulate for household distances with use of an assistive device at a modI level. Pt does demonstrate some L inattention, however this is very likely a chronic deficit from CVA in Novermber 2021. Pt reports no deficits compared to baseline at this time and is in agreement with PT recommendations to increase daily activity and ambulation upon return home. PT recommends no further acute PT services or PT follow-up. Acute PT signing off.    Follow Up Recommendations No PT follow up    Equipment Recommendations  None recommended by PT    Recommendations for Other Services       Precautions / Restrictions Precautions Precautions: Fall Precaution Comments: L inattention, likely baseline from previous CVA Restrictions Weight Bearing Restrictions: No      Mobility  Bed Mobility Overal bed mobility: Independent                  Transfers Overall transfer level: Modified independent Equipment used: Rolling walker (2 wheeled)                Ambulation/Gait Ambulation/Gait assistance: Modified independent (Device/Increase time) Gait Distance (Feet): 150 Feet Assistive device: Rolling walker (2 wheeled) Gait Pattern/deviations: Step-through pattern Gait velocity: reduced Gait velocity interpretation: <1.8 ft/sec, indicate of risk for recurrent falls General Gait Details: pt with steady step-through gait, reduced gait speed and speed of  turns  Careers information officer    Modified Rankin (Stroke Patients Only)       Balance Overall balance assessment: Needs assistance Sitting-balance support: No upper extremity supported;Feet supported Sitting balance-Leahy Scale: Good     Standing balance support: No upper extremity supported Standing balance-Leahy Scale: Fair Standing balance comment: dons mask without UE support                             Pertinent Vitals/Pain Pain Assessment: No/denies pain    Home Living Family/patient expects to be discharged to:: Private residence Living Arrangements: Children Available Help at Discharge: Family;Available 24 hours/day Type of Home: House Home Access: Ramped entrance     Home Layout: One level Home Equipment: Walker - 2 wheels      Prior Function Level of Independence: Independent with assistive device(s)         Comments: pt reports ambulating independently with use of RW     Hand Dominance        Extremity/Trunk Assessment   Upper Extremity Assessment Upper Extremity Assessment: Overall WFL for tasks assessed    Lower Extremity Assessment Lower Extremity Assessment: LLE deficits/detail LLE Deficits / Details: grossly 4/5    Cervical / Trunk Assessment Cervical / Trunk Assessment: Normal  Communication   Communication: HOH  Cognition Arousal/Alertness: Awake/alert Behavior During Therapy: WFL for tasks assessed/performed Overall Cognitive Status: No family/caregiver present to determine baseline cognitive functioning  General Comments: pt is able to answer all questions appropriately, demonstrates good memory of deficits and rehab process from prior CVA. Slowed processing at times      General Comments General comments (skin integrity, edema, etc.): VSS on RA, pt denies any symptoms    Exercises     Assessment/Plan    PT Assessment Patent does not need  any further PT services  PT Problem List         PT Treatment Interventions      PT Goals (Current goals can be found in the Care Plan section)       Frequency     Barriers to discharge        Co-evaluation               AM-PAC PT "6 Clicks" Mobility  Outcome Measure Help needed turning from your back to your side while in a flat bed without using bedrails?: None Help needed moving from lying on your back to sitting on the side of a flat bed without using bedrails?: None Help needed moving to and from a bed to a chair (including a wheelchair)?: None Help needed standing up from a chair using your arms (e.g., wheelchair or bedside chair)?: None Help needed to walk in hospital room?: None Help needed climbing 3-5 steps with a railing? : A Little 6 Click Score: 23    End of Session   Activity Tolerance: Patient tolerated treatment well Patient left: in bed;with call bell/phone within reach;with bed alarm set Nurse Communication: Mobility status      Time: 9169-4503 PT Time Calculation (min) (ACUTE ONLY): 29 min   Charges:   PT Evaluation $PT Eval Low Complexity: 1 Low          Arlyss Gandy, PT, DPT Acute Rehabilitation Pager: [phone]   Arlyss Gandy 10/25/2020, 3:57 PM

## 2020-10-25 NOTE — Progress Notes (Signed)
PROGRESS NOTE    Christine LORDS  JGG:[id number] DOB: 07-26-1960 DOA: 10/24/2020 PCP: Lupita Dawn, MD     Brief Narrative:  60 year old BF (from long-term care facility) PMHx HTN,, severe chronic systolic CHF (EF 20 to 25%), NICM, pulmonary HTN, DM type II uncontrolled with hyperglycemia, thalamic stroke in November 2021, tobacco abuse, chronic cholecystitis with calculus,   Presents with weakness.  She states that she woke up this morning feeling weak all over.  She cannot say if it is 1 side or the other.  Her family had noticed some slurred speech and maybe some confusion.  Her last known normal was 7 PM yesterday.  On chart review, it was noted that she had had some residual weakness from her prior stroke and left side facial drooping although patient says that she normally does not have any weakness.  She denies any fevers.  No cough or cold symptoms.  No pain anywhere.  No abdominal pain.  No vomiting or diarrhea.   Patient states has not been eating, and she has been feeling fatigued, weak.  Brother called 911 because patient was unable to eat/move around.   Subjective: A/O x4, would like to know when she can go home.  Believes she can take care of her self at home even though palliative care spoke with brother today and brother states patient unable to take care of her self and he is unable to take care of her either at this time.   Assessment & Plan: Covid vaccination; vaccinated 2/3   Principal Problem:   Acute lower UTI Active Problems:   Tobacco abuse   Hypertension   NICM (nonischemic cardiomyopathy) (HCC)   Pulmonary hypertension (HCC)   Chronic cholecystitis with calculus   History of CVA (cerebrovascular accident)   Type 2 diabetes mellitus with complication, with long-term current use of insulin (HCC)   Chronic systolic CHF (congestive heart failure) (HCC)   Urinary tract infection   Failure to thrive in adult   Proteus mirabilis infection    Acute lower UTI  positive Proteus mirabilis -Urinalysis is dirty.  Continue empiric antibiotic urine cultures pending   Failure to thrive -Patient admits that she is suffering from anorexia most likely secondary to her severe chronic systolic CHF. - Hospice care consult: Consider advanced care directives to not return to hospital secondary to weakness brought on by anorexia  vs hospice  Chronic systolic CHF/NICM/pulmonary HTN -Echocardiogram 03/12/2020 EF 20 to 25% - Older females with worsening cardiac function can present with fatigue, anorexia vs chest pain - Trend troponin - Echocardiogram pending: If shows cardiac worsening would support palliative care speaking more intensely concerning hospice care.  Even without worsening cardiac function anorexia/weight loss poor prognostic indicator. - Strict in and out - Daily weight   Hx CVA -MRI negative for acute stroke.   DM type II with complication, with long-term use of insulin -03/12/2020 hemoglobin A1c= 7.3 - Very sensitive SSI  Cachectic (BMI 15.82 kg/m) - Poor prognostic factor into patient with severe heart failure.       DVT prophylaxis: Lovenox Code Status: DNR Family Communication:  Status is: Inpatient    Dispo: The patient is from: Home              Anticipated d/c is to: Home vs SNF              Anticipated d/c date is: 6/20              Patient currently unstable  Consultants:  Palliative care    Procedures/Significant Events:  6/17 MRI brain W0 contrast:No evidence of acute intracranial abnormality.   Redemonstrated small chronic cortical/subcortical infarct within the left parietal lobe, chronic small-vessel infarct within the right parietal lobe and chronic infarct within the ventromedial right thalamus/right midbrain.   Stable background mild-to-moderate generalized parenchymal atrophy and moderate cerebral white matter chronic small vessel ischemic disease. Mild chronic small vessel ischemic changes  within the pons are also stable.   The axial SWI sequence is motion degraded. Small foci of chronic hemorrhage are questioned within the bilateral parietal lobes (versus artifact). 6/18 Echocardiogram; Left Ventricle: LVEF= 50 to 55%. The left ventricle has low normal function. The left ventricle has  no regional wall motion abnormalities. The left ventricular internal  cavity size was normal in size.  There is mild left ventricular hypertrophy. Left ventricular diastolic  parameters are consistent with Grade I diastolic dysfunction (impaired  relaxation). Indeterminate filling pressures.   Right Ventricle: The right ventricular size is normal. No increase in  right ventricular wall thickness. Right ventricular systolic function is  mildly reduced. Tricuspid regurgitation signal is inadequate for assessing  PA pressure.   Left Atrium: Left atrial size was mildly dilated.   Right Atrium: Right atrial size was normal in size.   Pericardium: There is no evidence of pericardial effusion.   Mitral Valve: The mitral valve is grossly normal. Trivial mitral valve  regurgitation.   Tricuspid Valve: The tricuspid valve is grossly normal. Tricuspid valve  regurgitation is trivial.   Aortic Valve: The aortic valve is tricuspid. Aortic valve regurgitation is  not visualized.   Pulmonic Valve: The pulmonic valve was grossly normal. Pulmonic valve  regurgitation is not visualized.   Aorta: The aortic root and ascending aorta are structurally normal, with  no evidence of dilitation.   Venous: The inferior vena cava is normal in size with greater than 50%  respiratory variability, suggesting right atrial pressure of 3 mmHg.   IAS/Shunts: No atrial level shunt detected by color flow Doppler.   I have personally reviewed and interpreted all radiology studies and my findings are as above.  VENTILATOR SETTINGS:    Cultures 6/17 urine positive Proteus mirabilis 6/17 blood pending 6/17  SARS coronavirus negative 6/17 influenza A/B negative   Antimicrobials: Anti-infectives (From admission, onward)    Start     Dose/Rate Route Frequency Ordered Stop   10/25/20 1700  cefTRIAXone (ROCEPHIN) 1 g in sodium chloride 0.9 % 100 mL IVPB        1 g 200 mL/hr over 30 Minutes Intravenous Every 24 hours 10/24/20 1929     10/24/20 1630  cefTRIAXone (ROCEPHIN) 1 g in sodium chloride 0.9 % 100 mL IVPB        1 g 200 mL/hr over 30 Minutes Intravenous  Once 10/24/20 1623 10/24/20 1934         Devices    LINES / TUBES:      Continuous Infusions:  cefTRIAXone (ROCEPHIN)  IV 1 g (10/25/20 1614)     Objective: Vitals:   10/25/20 0755 10/25/20 1157 10/25/20 1600 10/25/20 2015  BP: 92/69 102/63 115/65 (!) 112/59  Pulse: 92 96 91 78  Resp: 17 14 18 15   Temp: 98 F (36.7 C) 98.2 F (36.8 C) 97.6 F (36.4 C) 97.8 F (36.6 C)  TempSrc: Axillary Axillary Oral Axillary  SpO2: 100% 100% 99% 100%  Weight:      Height:        Intake/Output  Summary (Last 24 hours) at 10/25/2020 2108 Last data filed at 10/25/2020 1633 Gross per 24 hour  Intake 285.23 ml  Output --  Net 285.23 ml   Filed Weights   10/24/20 1143  Weight: 44.5 kg    Examination:  General: A/O x4, No acute respiratory distress, cachectic Eyes: negative scleral hemorrhage, negative anisocoria, negative icterus ENT: Negative Runny nose, negative gingival bleeding, Positive hard of hearing Neck:  Negative scars, masses, torticollis, lymphadenopathy, JVD Lungs: Clear to auscultation bilaterally without wheezes or crackles Cardiovascular: Regular rate and rhythm without murmur gallop or rub normal S1 and S2 Abdomen: negative abdominal pain, nondistended, positive soft, bowel sounds, no rebound, no ascites, no appreciable mass Extremities: No significant cyanosis, clubbing, or edema bilateral lower extremities Skin: Negative rashes, lesions, ulcers Psychiatric:  Negative depression, negative anxiety,  negative fatigue, negative mania Central nervous system:  Cranial nerves II through XII intact, tongue/uvula midline, all extremities muscle strength 5/5, sensation intact throughout,  negative dysarthria, negative expressive aphasia, negative receptive aphasia  .     Data Reviewed: Care during the described time interval was provided by me .  I have reviewed this patient's available data, including medical history, events of note, physical examination, and all test results as part of my evaluation.  CBC: Recent Labs  Lab 10/24/20 1340 10/24/20 1725 10/25/20 0009  WBC 11.4* 9.4 7.9  NEUTROABS 8.4*  --  5.0  HGB 12.4 11.8* 11.4*  HCT 39.5 37.5 35.3*  MCV 97.3 97.4 97.0  PLT 361 333 292   Basic Metabolic Panel: Recent Labs  Lab 10/24/20 1340 10/24/20 1725 10/25/20 0009  NA 139  --  138  K 5.3*  --  4.8  CL 107  --  105  CO2 23  --  22  GLUCOSE 66*  --  265*  BUN 21*  --  22*  CREATININE 1.30* 1.26* 1.25*  CALCIUM 10.0  --  9.4  MG  --   --  2.2  PHOS  --   --  3.3   GFR: Estimated Creatinine Clearance: 34.5 mL/min (A) (by C-G formula based on SCr of 1.25 mg/dL (H)). Liver Function Tests: Recent Labs  Lab 10/24/20 1340 10/25/20 0009  AST 33 28  ALT 27 22  ALKPHOS 80 71  BILITOT 0.8 1.1  PROT 8.2* 7.2  ALBUMIN 3.9 3.3*   No results for input(s): LIPASE, AMYLASE in the last 168 hours. No results for input(s): AMMONIA in the last 168 hours. Coagulation Profile: Recent Labs  Lab 10/24/20 1340  INR 0.9   Cardiac Enzymes: No results for input(s): CKTOTAL, CKMB, CKMBINDEX, TROPONINI in the last 168 hours. BNP (last 3 results) No results for input(s): PROBNP in the last 8760 hours. HbA1C: No results for input(s): HGBA1C in the last 72 hours. CBG: Recent Labs  Lab 10/25/20 0346 10/25/20 0758 10/25/20 1155 10/25/20 1625 10/25/20 2056  GLUCAP 278* 79 328* 273* 220*   Lipid Profile: No results for input(s): CHOL, HDL, LDLCALC, TRIG, CHOLHDL, LDLDIRECT in  the last 72 hours. Thyroid Function Tests: No results for input(s): TSH, T4TOTAL, FREET4, T3FREE, THYROIDAB in the last 72 hours. Anemia Panel: No results for input(s): VITAMINB12, FOLATE, FERRITIN, TIBC, IRON, RETICCTPCT in the last 72 hours. Sepsis Labs: No results for input(s): PROCALCITON, LATICACIDVEN in the last 168 hours.  Recent Results (from the past 240 hour(s))  Resp Panel by RT-PCR (Flu A&B, Covid) Nasopharyngeal Swab     Status: None   Collection Time: 10/24/20 12:48 PM  Specimen: Nasopharyngeal Swab; Nasopharyngeal(NP) swabs in vial transport medium  Result Value Ref Range Status   SARS Coronavirus 2 by RT PCR NEGATIVE NEGATIVE Final    Comment: (NOTE) SARS-CoV-2 target nucleic acids are NOT DETECTED.  The SARS-CoV-2 RNA is generally detectable in upper respiratory specimens during the acute phase of infection. The lowest concentration of SARS-CoV-2 viral copies this assay can detect is 138 copies/mL. A negative result does not preclude SARS-Cov-2 infection and should not be used as the sole basis for treatment or other patient management decisions. A negative result may occur with  improper specimen collection/handling, submission of specimen other than nasopharyngeal swab, presence of viral mutation(s) within the areas targeted by this assay, and inadequate number of viral copies(<138 copies/mL). A negative result must be combined with clinical observations, patient history, and epidemiological information. The expected result is Negative.  Fact Sheet for Patients:  BloggerCourse.com  Fact Sheet for Healthcare Providers:  SeriousBroker.it  This test is no t yet approved or cleared by the [place] FDA and  has been authorized for detection and/or diagnosis of SARS-CoV-2 by FDA under an Emergency Use Authorization (EUA). This EUA will remain  in effect (meaning this test can be used) for the duration of  the COVID-19 declaration under Section 564(b)(1) of the Act, 21 U.S.C.section 360bbb-3(b)(1), unless the authorization is terminated  or revoked sooner.       Influenza A by PCR NEGATIVE NEGATIVE Final   Influenza B by PCR NEGATIVE NEGATIVE Final    Comment: (NOTE) The Xpert Xpress SARS-CoV-2/FLU/RSV plus assay is intended as an aid in the diagnosis of influenza from Nasopharyngeal swab specimens and should not be used as a sole basis for treatment. Nasal washings and aspirates are unacceptable for Xpert Xpress SARS-CoV-2/FLU/RSV testing.  Fact Sheet for Patients: BloggerCourse.com  Fact Sheet for Healthcare Providers: SeriousBroker.it  This test is not yet approved or cleared by the [place] FDA and has been authorized for detection and/or diagnosis of SARS-CoV-2 by FDA under an Emergency Use Authorization (EUA). This EUA will remain in effect (meaning this test can be used) for the duration of the COVID-19 declaration under Section 564(b)(1) of the Act, 21 U.S.C. section 360bbb-3(b)(1), unless the authorization is terminated or revoked.  Performed at [facility], [address] [place], [place] [address]   Urine culture     Status: Abnormal (Preliminary result)   Collection Time: 10/24/20  1:00 PM   Specimen: Urine, Clean Catch  Result Value Ref Range Status   Specimen Description URINE, CLEAN CATCH  Final   Special Requests NONE  Final   Culture (A)  Final    >=100,000 COLONIES/mL PROTEUS MIRABILIS SUSCEPTIBILITIES TO FOLLOW Performed at [facility], [address] [place], [place] [address]    Report Status PENDING  Incomplete  Culture, blood (routine x 2)     Status: None (Preliminary result)   Collection Time: 10/24/20  9:00 PM   Specimen: BLOOD  Result Value Ref Range Status   Specimen Description BLOOD RIGHT ARM  Final   Special Requests   Final    BOTTLES DRAWN AEROBIC AND ANAEROBIC Blood  Culture results may not be optimal due to an inadequate volume of blood received in culture bottles   Culture   Final    NO GROWTH < 24 HOURS Performed at [facility], [address] [place], [place] [address]    Report Status PENDING  Incomplete  Culture, blood (routine x 2)  Status: None (Preliminary result)   Collection Time: 10/24/20  9:18 PM   Specimen: BLOOD  Result Value Ref Range Status   Specimen Description BLOOD LEFT HAND  Final   Special Requests   Final    BOTTLES DRAWN AEROBIC ONLY Blood Culture results may not be optimal due to an inadequate volume of blood received in culture bottles   Culture   Final    NO GROWTH < 24 HOURS Performed at [facility], [address] [place], [place] [address]    Report Status PENDING  Incomplete         Radiology Studies: DG Chest 1 View  Result Date: 10/24/2020 CLINICAL DATA:  Confused.  Clearance for MRI. EXAM: CHEST  1 VIEW COMPARISON:  One-view chest x-ray 03/14/2020 FINDINGS: The heart size and mediastinal contours are within normal limits. Both lungs are clear. The visualized skeletal structures are unremarkable. IMPRESSION: No radiopaque foreign body. No acute cardiopulmonary disease. Electronically Signed   By: Marin Roberts M.D.   On: 10/24/2020 15:56   DG Abdomen 1 View  Result Date: 10/24/2020 CLINICAL DATA:  Clearance for MRI. EXAM: ABDOMEN - 1 VIEW COMPARISON:  None. FINDINGS: The bowel gas pattern is normal. No radio-opaque calculi or other significant radiographic abnormality are seen. IMPRESSION: Negative one view abdomen. No radiopaque foreign body to contraindicate MRI. Electronically Signed   By: Marin Roberts M.D.   On: 10/24/2020 15:57   CT HEAD WO CONTRAST  Result Date: 10/24/2020 CLINICAL DATA:  Neuro deficit, acute, stroke suspected. Additional history provided: Generalized weakness today, inability to stand. History of prior stroke. EXAM: CT HEAD WITHOUT CONTRAST TECHNIQUE:  Contiguous axial images were obtained from the base of the skull through the vertex without intravenous contrast. COMPARISON:  CT angiogram head 03/12/2020.  Brain MRI 03/11/2020. FINDINGS: Brain: Stable cerebral and cerebellar atrophy. Redemonstrated small chronic cortical/subcortical infarct within the left parietal lobe. Redemonstrated remote lacunar infarct within the right parietal white matter (series 3, image 18). Mild-to-moderate ill-defined hypoattenuation within the cerebral white matter is nonspecific, but compatible with chronic small vessel ischemic disease. Known chronic infarct within the right thalamus and midbrain, better appreciated on the brain MRI of 03/11/2020 (acute at that time). There is no acute intracranial hemorrhage. No acute demarcated cortical infarct. No extra-axial fluid collection. No evidence of intracranial mass. No midline shift. Vascular: No hyperdense vessel.  Atherosclerotic calcifications. Skull: Normal. Negative for fracture or focal lesion. Sinuses/Orbits: Visualized orbits show no acute finding. Opacification of the left ethmoid air cell. Background trace bilateral ethmoid sinus mucosal thickening. IMPRESSION: No evidence of acute intracranial abnormality. Redemonstrated chronic infarcts within the cortical/subcortical left parietal lobe, right parietal white matter and right thalamus/midbrain. Stable background mild-to-moderate cerebral white matter chronic small vessel ischemic disease. Stable cerebral and cerebellar atrophy. Mild paranasal sinus disease at the imaged levels, as described. Electronically Signed   By: Jackey Loge DO   On: 10/24/2020 13:55   MR BRAIN WO CONTRAST  Result Date: 10/24/2020 CLINICAL DATA:  Neuro deficit, acute, stroke suspected. EXAM: MRI HEAD WITHOUT CONTRAST TECHNIQUE: Multiplanar, multiecho pulse sequences of the brain and surrounding structures were obtained without intravenous contrast. COMPARISON:  Prior head CT examinations  10/24/2020 and earlier. Brain MRI 03/11/2020. FINDINGS: Brain: Mild intermittent motion degradation. Mild-to-moderate cerebral and cerebellar atrophy. Redemonstrated chronic infarct within the ventromedial right thalamus and right midbrain. There is a small amount of residual restricted diffusion along the periphery of the thalamic component of this infarct. Small chronic cortical/subcortical infarct  within the left parietal lobe. Chronic small-vessel infarct within the right parietal white matter. Background moderate multifocal T2/FLAIR hyperintensity within the cerebral white matter, nonspecific but compatible with chronic small vessel ischemic disease. To a lesser degree, mild chronic small vessel ischemic changes are also present within the pons. The axial SWI sequence is motion degraded. Small foci of chronic hemosiderin deposition are questioned within the bilateral parietal lobes. There is no acute infarct. No evidence of intracranial mass. No extra-axial fluid collection. No midline shift. Vascular: Expected proximal arterial flow voids. Skull and upper cervical spine: No focal marrow lesion. Sinuses/Orbits: Visualized orbits show no acute finding. Opacification of a left ethmoid air cell. IMPRESSION: No evidence of acute intracranial abnormality. Redemonstrated small chronic cortical/subcortical infarct within the left parietal lobe, chronic small-vessel infarct within the right parietal lobe and chronic infarct within the ventromedial right thalamus/right midbrain. Stable background mild-to-moderate generalized parenchymal atrophy and moderate cerebral white matter chronic small vessel ischemic disease. Mild chronic small vessel ischemic changes within the pons are also stable. The axial SWI sequence is motion degraded. Small foci of chronic hemorrhage are questioned within the bilateral parietal lobes (versus artifact). Electronically Signed   By: Jackey Loge DO   On: 10/24/2020 18:34   ECHOCARDIOGRAM  COMPLETE  Result Date: 10/25/2020    ECHOCARDIOGRAM REPORT   Patient Name:   Christine Benitez Date of Exam: 10/25/2020 Medical Rec #:  [id number]       Height:       66.0 in Accession #:    [id number]      Weight:       98.0 lb Date of Birth:  [birth year]       BSA:          1.477 m Patient Age:    59 years        BP:           90/58 mmHg Patient Gender: F               HR:           86 bpm. Exam Location:  Inpatient Procedure: 2D Echo, Color Doppler and Cardiac Doppler Indications:    CHF-Acute Systolic I50.21  History:        Patient has prior history of Echocardiogram examinations, most                 recent 03/12/2020. CHF and Cardiomyopathy, Pulmonary HTN; Risk                 Factors:Hypertension, Diabetes and Current Smoker.  Sonographer:    Eulah Pont RDCS Referring Phys: [id number] Lekita Kerekes J Djeneba Barsch IMPRESSIONS  1. Left ventricular ejection fraction, by estimation, is 50 to 55%. The left ventricle has low normal function. The left ventricle has no regional wall motion abnormalities. There is mild left ventricular hypertrophy. Left ventricular diastolic parameters are consistent with Grade I diastolic dysfunction (impaired relaxation).  2. Right ventricular systolic function is mildly reduced. The right ventricular size is normal. Tricuspid regurgitation signal is inadequate for assessing PA pressure.  3. Left atrial size was mildly dilated.  4. The mitral valve is grossly normal. Trivial mitral valve regurgitation.  5. The aortic valve is tricuspid. Aortic valve regurgitation is not visualized.  6. The inferior vena cava is normal in size with greater than 50% respiratory variability, suggesting right atrial pressure of 3 mmHg. Comparison(s): Changes from prior study are noted. 03/12/2020: LVEF 20-25%. FINDINGS  Left Ventricle: Left ventricular ejection fraction,  by estimation, is 50 to 55%. The left ventricle has low normal function. The left ventricle has no regional wall motion abnormalities. The left  ventricular internal cavity size was normal in size. There is mild left ventricular hypertrophy. Left ventricular diastolic parameters are consistent with Grade I diastolic dysfunction (impaired relaxation). Indeterminate filling pressures. Right Ventricle: The right ventricular size is normal. No increase in right ventricular wall thickness. Right ventricular systolic function is mildly reduced. Tricuspid regurgitation signal is inadequate for assessing PA pressure. Left Atrium: Left atrial size was mildly dilated. Right Atrium: Right atrial size was normal in size. Pericardium: There is no evidence of pericardial effusion. Mitral Valve: The mitral valve is grossly normal. Trivial mitral valve regurgitation. Tricuspid Valve: The tricuspid valve is grossly normal. Tricuspid valve regurgitation is trivial. Aortic Valve: The aortic valve is tricuspid. Aortic valve regurgitation is not visualized. Pulmonic Valve: The pulmonic valve was grossly normal. Pulmonic valve regurgitation is not visualized. Aorta: The aortic root and ascending aorta are structurally normal, with no evidence of dilitation. Venous: The inferior vena cava is normal in size with greater than 50% respiratory variability, suggesting right atrial pressure of 3 mmHg. IAS/Shunts: No atrial level shunt detected by color flow Doppler.  LEFT VENTRICLE PLAX 2D LVIDd:         3.10 cm  Diastology LVIDs:         2.30 cm  LV e' medial:    4.39 cm/s LV PW:         1.10 cm  LV E/e' medial:  9.1 LV IVS:        0.90 cm  LV e' lateral:   4.67 cm/s LVOT diam:     2.00 cm  LV E/e' lateral: 8.5 LV SV:         45 LV SV Index:   31 LVOT Area:     3.14 cm  RIGHT VENTRICLE RV S prime:     8.16 cm/s TAPSE (M-mode): 1.8 cm LEFT ATRIUM             Index       RIGHT ATRIUM          Index LA diam:        2.50 cm 1.69 cm/m  RA Area:     9.82 cm LA Vol (A2C):   24.3 ml 16.45 ml/m RA Volume:   21.80 ml 14.76 ml/m LA Vol (A4C):   23.6 ml 15.98 ml/m LA Biplane Vol: 25.7 ml  17.40 ml/m  AORTIC VALVE LVOT Vmax:   94.30 cm/s LVOT Vmean:  59.300 cm/s LVOT VTI:    0.144 m  AORTA Ao Root diam: 2.60 cm Ao Asc diam:  2.70 cm MITRAL VALVE MV Area (PHT): 2.13 cm    SHUNTS MV Decel Time: 356 msec    Systemic VTI:  0.14 m MV E velocity: 39.80 cm/s  Systemic Diam: 2.00 cm MV A velocity: 78.20 cm/s MV E/A ratio:  0.51 Zoila Shutter MD Electronically signed by Zoila Shutter MD Signature Date/Time: 10/25/2020/3:51:47 PM    Final         Scheduled Meds:  aspirin EC  81 mg Oral Daily   dronabinol  5 mg Oral BID AC   enoxaparin (LOVENOX) injection  30 mg Subcutaneous Q24H   insulin aspart  0-5 Units Subcutaneous QHS   [START ON 10/26/2020] insulin aspart  0-6 Units Subcutaneous TID WC   Continuous Infusions:  cefTRIAXone (ROCEPHIN)  IV 1 g (10/25/20 1614)     LOS:  0 days    Time spent:40 min    Naoki Migliaccio, Roselind Messier, MD [facility]   If 7PM-7AM, please contact night-coverage 10/25/2020, 9:08 PM

## 2020-10-25 NOTE — Consult Note (Signed)
Palliative Medicine Inpatient Consult Note  Reason for consult:  "Consider advanced care directives to not return to hospital secondary to weakness brought on by anorexia  vs hospice"  HPI:  Per intake H&P -->  60 year old BF (from long-term care facility) PMHx HTN,, severe chronic systolic CHF (EF 20 to 67%), NICM, pulmonary HTN, DM type II uncontrolled with hyperglycemia, thalamic stroke in November 2021, tobacco abuse, chronic cholecystitis with calculus. Presented for weakness - has suffered from ongoing anorexia. Palliative care was consulted to further address goals of care.   Of note, Christine Benitez was seen in November 2021 post thalmic cerebral infarction - during that admission a DNR was obtained.   Clinical Assessment/Goals of Care:  *Please note that this is a verbal dictation therefore any spelling or grammatical errors are due to the "[place] [employer]" system interpretation.  I have reviewed medical records including EPIC notes, labs and imaging, received report from bedside RN, assessed the patient who was lying in bed and in no distress at the time of assessment.    I met with Christine Benitez and spoke to her brother, Christine Benitez to further discuss diagnosis prognosis, GOC, EOL wishes, disposition and options.   I introduced Palliative Medicine as specialized medical care for people living with serious illness. It focuses on providing relief from the symptoms and stress of a serious illness. The goal is to improve quality of life for both the patient and the family.  Christine Benitez shares with me that she is from [place], [place]. She is divorced. She has a son and a daughter ages 57 and 50 who live locally. 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 She is a woman of faith and practices within the [facility] denomination.  Prior to hospitalization Christine Benitez was at the [facility] for quite a long rehabilitation stint prior to then returning home to [place]. She  has been dependent on her daughter for care and sadly, her daughter Christine Benitez is now hospitalized herself. Christine Benitez has been doing most bADL's on her own with the exception of cooking. She uses a walker.   We reviewed patients co-morbidities inclusive of prior stroke(s), CHF (EF 20 to 25%), pulmonary HTN, DM type II. Discussed that heart failure is at the late stages. We reviewed that her disease need attentive management to decrease worsening/re-hospitalizations. She herself is limited in what she is able to do in terms dietary restrictions since her daughter is her primary CG.  Christine Benitez explains that she thought she was suffering a stroke yesterday and if relieved that she has a UTI which caused her symptoms as opposed to something more significant.   A detailed discussion was had today regarding advanced directives - there are none on file though per chart review in the past her mother, Christine Benitez has helped with decision making.    Concepts specific to code status, artifical feeding and hydration, continued IV antibiotics and rehospitalization was had.  A MOST form was introduced and completed with the following wishes:  Cardiopulmonary Resuscitation: Do Not Attempt Resuscitation (DNR/No CPR)  Medical Interventions: Limited Additional Interventions: Use medical treatment, IV fluids and cardiac monitoring as indicated, DO NOT USE intubation or mechanical ventilation. May consider use of less invasive airway support such as BiPAP or CPAP. Also provide comfort measures. Transfer to the hospital if indicated. Avoid intensive care.   Antibiotics: Antibiotics if indicated  IV Fluids: IV fluids for a defined trial period  Feeding Tube: No feeding tube  The difference between a aggressive medical intervention path  and a palliative comfort care path for this patient at this time was had. Values and goals of care important to patient and family were attempted to be elicited. Christine Benitez is not yet at a point where she is ready  for hospice and does feel with increased mobility she does have some quality in her life. Discussed failure to thrive which she is willing to try an appetite stimulant for. We also reviewed having an OP Palliative provider follow up with her.   A gait and balance assessment was complete. He was a one person assist for mobility.   Discussed the importance of continued conversation with family and their  medical providers regarding overall plan of care and treatment options, ensuring decisions are within the context of the patients values and GOCs.  Provided "Hard Choices for [employer]" booklet.   Decision Maker: Patient makes decisions for herself though if needed her mother, Christine Benitez (mother) [phone] can assist in decision making  SUMMARY OF RECOMMENDATIONS   DNAR/DNI  MOST Completed, paper copy placed onto the chart electric copy can be found in [facility]  DNR Form Completed, paper copy placed onto the chart electric copy can be found in [facility]  Request to Spiritual for Advance Directives   Generalized Weakness - PT/OT  Malnutrition - Dietary Eval. Start Marinol 66m PO BID  Social - Patients primary CG is now hospitalized  TOC - OP Palliative Care  Ongoing support  Code Status/Advance Care Planning: DNAR/DNI   Palliative Prophylaxis:  Oral Care, Mobility   Additional Recommendations (Limitations, Scope, Preferences): Continue to treat what it treatable   Psycho-social/Spiritual:  Desire for further Chaplaincy support: Yes - [employer] Additional Recommendations: Education on chronic diseases    Prognosis: Limited in the setting of FTT, multiple co-morbidities, and debility has a high 12 month mortality risk.  Discharge Planning: Discharge plan is uncertain  Vitals:   [id number] 10/25/20 0402  BP: 105/71 (!) 90/58  Pulse: 88 82  Resp: 17 20  Temp: 98.7 F (37.1 C) 98 F (36.7 C)  SpO2: 99% 99%    Intake/Output Summary (Last 24 hours) at 10/25/2020  0657 Last data filed at 10/24/2020 1934 Gross per 24 hour  Intake 100 ml  Output --  Net 100 ml   Last Weight  Most recent update: 10/24/2020 11:44 AM    Weight  44.5 kg (98 lb)            Gen:  AA F appears older than stated age H32 moist mucous membranes CV: Regular rate and rhythm, no murmurs rubs or gallops PULM: clear to auscultation bilaterally  ABD: soft/nontender  EXT: No edema  Neuro: Alert and oriented x3   PPS: 50%   This conversation/these recommendations were discussed with patient primary care team, Dr. WSherral Hammers Time In: 1000 Time Out: 1130 Total Time: 90 Greater than 50%  of this time was spent counseling and coordinating care related to the above assessment and plan.  [place] Team Cell Phone: [phone] utilize secure chat with additional questions, if there is no response within 30 minutes please call the above phone number  Palliative Medicine Team providers are available by phone from 7am to 7pm daily and can be reached through the team cell phone.  Should this patient require assistance outside of these hours, please call the patient's attending physician.

## 2020-10-26 DIAGNOSIS — R627 Adult failure to thrive: Secondary | ICD-10-CM | POA: Diagnosis present

## 2020-10-26 DIAGNOSIS — I69392 Facial weakness following cerebral infarction: Secondary | ICD-10-CM | POA: Diagnosis not present

## 2020-10-26 DIAGNOSIS — B964 Proteus (mirabilis) (morganii) as the cause of diseases classified elsewhere: Secondary | ICD-10-CM | POA: Diagnosis present

## 2020-10-26 DIAGNOSIS — Z794 Long term (current) use of insulin: Secondary | ICD-10-CM | POA: Diagnosis not present

## 2020-10-26 DIAGNOSIS — I428 Other cardiomyopathies: Secondary | ICD-10-CM | POA: Diagnosis present

## 2020-10-26 DIAGNOSIS — K8010 Calculus of gallbladder with chronic cholecystitis without obstruction: Secondary | ICD-10-CM | POA: Diagnosis present

## 2020-10-26 DIAGNOSIS — Z87891 Personal history of nicotine dependence: Secondary | ICD-10-CM | POA: Diagnosis not present

## 2020-10-26 DIAGNOSIS — Z7984 Long term (current) use of oral hypoglycemic drugs: Secondary | ICD-10-CM | POA: Diagnosis not present

## 2020-10-26 DIAGNOSIS — I110 Hypertensive heart disease with heart failure: Secondary | ICD-10-CM | POA: Diagnosis present

## 2020-10-26 DIAGNOSIS — R410 Disorientation, unspecified: Secondary | ICD-10-CM | POA: Diagnosis not present

## 2020-10-26 DIAGNOSIS — E1165 Type 2 diabetes mellitus with hyperglycemia: Secondary | ICD-10-CM | POA: Diagnosis present

## 2020-10-26 DIAGNOSIS — Z20822 Contact with and (suspected) exposure to covid-19: Secondary | ICD-10-CM | POA: Diagnosis present

## 2020-10-26 DIAGNOSIS — N390 Urinary tract infection, site not specified: Principal | ICD-10-CM

## 2020-10-26 DIAGNOSIS — I5084 End stage heart failure: Secondary | ICD-10-CM | POA: Diagnosis present

## 2020-10-26 DIAGNOSIS — I69398 Other sequelae of cerebral infarction: Secondary | ICD-10-CM | POA: Diagnosis not present

## 2020-10-26 DIAGNOSIS — Z7902 Long term (current) use of antithrombotics/antiplatelets: Secondary | ICD-10-CM | POA: Diagnosis not present

## 2020-10-26 DIAGNOSIS — Z7982 Long term (current) use of aspirin: Secondary | ICD-10-CM | POA: Diagnosis not present

## 2020-10-26 DIAGNOSIS — Z66 Do not resuscitate: Secondary | ICD-10-CM | POA: Diagnosis present

## 2020-10-26 DIAGNOSIS — E782 Mixed hyperlipidemia: Secondary | ICD-10-CM | POA: Diagnosis present

## 2020-10-26 DIAGNOSIS — R630 Anorexia: Secondary | ICD-10-CM | POA: Diagnosis present

## 2020-10-26 DIAGNOSIS — I272 Pulmonary hypertension, unspecified: Secondary | ICD-10-CM | POA: Diagnosis present

## 2020-10-26 DIAGNOSIS — I5022 Chronic systolic (congestive) heart failure: Secondary | ICD-10-CM | POA: Diagnosis present

## 2020-10-26 DIAGNOSIS — Z681 Body mass index (BMI) 19 or less, adult: Secondary | ICD-10-CM | POA: Diagnosis not present

## 2020-10-26 DIAGNOSIS — Z597 Insufficient social insurance and welfare support: Secondary | ICD-10-CM | POA: Diagnosis not present

## 2020-10-26 DIAGNOSIS — R64 Cachexia: Secondary | ICD-10-CM | POA: Diagnosis present

## 2020-10-26 LAB — COMPREHENSIVE METABOLIC PANEL
ALT: 23 U/L (ref 0–44)
AST: 24 U/L (ref 15–41)
Albumin: 2.8 g/dL — ABNORMAL LOW (ref 3.5–5.0)
Alkaline Phosphatase: 60 U/L (ref 38–126)
Anion gap: 8 (ref 5–15)
BUN: 30 mg/dL — ABNORMAL HIGH (ref 6–20)
CO2: 25 mmol/L (ref 22–32)
Calcium: 9 mg/dL (ref 8.9–10.3)
Chloride: 102 mmol/L (ref 98–111)
Creatinine, Ser: 1.37 mg/dL — ABNORMAL HIGH (ref 0.44–1.00)
GFR, Estimated: 45 mL/min — ABNORMAL LOW (ref >60)
Glucose, Bld: 249 mg/dL — ABNORMAL HIGH (ref 70–99)
Potassium: 4.8 mmol/L (ref 3.5–5.1)
Sodium: 135 mmol/L (ref 135–145)
Total Bilirubin: 0.2 mg/dL — ABNORMAL LOW (ref 0.3–1.2)
Total Protein: 6.1 g/dL — ABNORMAL LOW (ref 6.5–8.1)

## 2020-10-26 LAB — CBC WITH DIFFERENTIAL/PLATELET
Abs Immature Granulocytes: 0.01 10*3/uL (ref 0.00–0.07)
Basophils Absolute: 0 10*3/uL (ref 0.0–0.1)
Basophils Relative: 1 %
Eosinophils Absolute: 0.1 10*3/uL (ref 0.0–0.5)
Eosinophils Relative: 2 %
HCT: 29.6 % — ABNORMAL LOW (ref 36.0–46.0)
Hemoglobin: 9.3 g/dL — ABNORMAL LOW (ref 12.0–15.0)
Immature Granulocytes: 0 %
Lymphocytes Relative: 36 %
Lymphs Abs: 2.3 10*3/uL (ref 0.7–4.0)
MCH: 30.4 pg (ref 26.0–34.0)
MCHC: 31.4 g/dL (ref 30.0–36.0)
MCV: 96.7 fL (ref 80.0–100.0)
Monocytes Absolute: 0.6 10*3/uL (ref 0.1–1.0)
Monocytes Relative: 10 %
Neutro Abs: 3.2 10*3/uL (ref 1.7–7.7)
Neutrophils Relative %: 51 %
Platelets: 283 10*3/uL (ref 150–400)
RBC: 3.06 MIL/uL — ABNORMAL LOW (ref 3.87–5.11)
RDW: 13 % (ref 11.5–15.5)
WBC: 6.3 10*3/uL (ref 4.0–10.5)
nRBC: 0 % (ref 0.0–0.2)

## 2020-10-26 LAB — URINE CULTURE: Culture: >=100000 — AB

## 2020-10-26 LAB — GLUCOSE, CAPILLARY
Glucose-Capillary: 305 mg/dL — ABNORMAL HIGH (ref 70–99)
Glucose-Capillary: 276 mg/dL — ABNORMAL HIGH (ref 70–99)
Glucose-Capillary: 288 mg/dL — ABNORMAL HIGH (ref 70–99)
Glucose-Capillary: 323 mg/dL — ABNORMAL HIGH (ref 70–99)

## 2020-10-26 LAB — MAGNESIUM: Magnesium: 2.1 mg/dL (ref 1.7–2.4)

## 2020-10-26 LAB — PHOSPHORUS: Phosphorus: 3.9 mg/dL (ref 2.5–4.6)

## 2020-10-26 MED ORDER — INSULIN GLARGINE 100 UNIT/ML ~~LOC~~ SOLN
18.0000 [IU] | Freq: Every day | SUBCUTANEOUS | Status: DC
  Administered 2020-10-26: 18 [IU] via SUBCUTANEOUS
  Filled 2020-10-26 (×2): qty 0.18

## 2020-10-26 MED ORDER — SODIUM CHLORIDE 0.9 % IV SOLN
INTRAVENOUS | Status: DC | PRN
  Administered 2020-10-26: 250 mL via INTRAVENOUS

## 2020-10-26 NOTE — Progress Notes (Signed)
PROGRESS NOTE    Christine Benitez  KAJ:[id number] DOB: 02-22-1961 DOA: 10/24/2020 PCP: Lupita Dawn, MD     Brief Narrative:  60 year old BF (from long-term care facility) PMHx HTN,, severe chronic systolic CHF (EF 20 to 25%), NICM, pulmonary HTN, DM type II uncontrolled with hyperglycemia, thalamic stroke in November 2021, tobacco abuse, chronic cholecystitis with calculus,   Presents with weakness.  She states that she woke up this morning feeling weak all over.  She cannot say if it is 1 side or the other.  Her family had noticed some slurred speech and maybe some confusion.  Her last known normal was 7 PM yesterday.  On chart review, it was noted that she had had some residual weakness from her prior stroke and left side facial drooping although patient says that she normally does not have any weakness.  She denies any fevers.  No cough or cold symptoms.  No pain anywhere.  No abdominal pain.  No vomiting or diarrhea.   Patient states has not been eating, and she has been feeling fatigued, weak.  Brother called 911 because patient was unable to eat/move around.   Subjective: 6/19 afebrile overnight A/O x4, patient has extremely poor understanding of her disease process.  Admits that her brother could not even help her to the bathroom (only weighs 45.9 kg), but states brother and/or other family members will be able to assist her with her ADLs at home.   Assessment & Plan: Covid vaccination; vaccinated 2/3   Principal Problem:   Acute lower UTI Active Problems:   Tobacco abuse   Hypertension   NICM (nonischemic cardiomyopathy) (HCC)   Pulmonary hypertension (HCC)   Chronic cholecystitis with calculus   History of CVA (cerebrovascular accident)   Type 2 diabetes mellitus with complication, with long-term current use of insulin (HCC)   Chronic systolic CHF (congestive heart failure) (HCC)   Urinary tract infection   Failure to thrive in adult   Proteus mirabilis infection     Acute lower UTI positive Proteus mirabilis -Urinalysis is dirty.  Continue empiric antibiotic urine cultures pending -6/19 complete 5-day course antibiotics   Failure to thrive -Patient admits that she is suffering from anorexia most likely secondary to her severe chronic systolic CHF. - Hospice care consult: Consider advanced care directives to not return to hospital secondary to weakness brought on by anorexia  vs hospice  Chronic systolic CHF/NICM/pulmonary HTN -Echocardiogram 03/12/2020 EF 20 to 25% - Older females with worsening cardiac function can present with fatigue, anorexia vs chest pain - Trend troponin - 6/18 Echocardiogram: Shows improved EF 50 to 55% see results below. - Even without worsening cardiac function anorexia/weight loss poor prognostic indicator. - Strict in and out - Daily weight   Hx CVA -MRI negative for acute stroke.   DM type II with complication, with long-term use of insulin -03/12/2020 hemoglobin A1c= 7.3 - 6/19 restart home Lantus 18 units daily - Very sensitive SSI   Cachectic (BMI 15.82 kg/m) - Poor prognostic factor into patient with severe heart failure.       DVT prophylaxis: Lovenox Code Status: DNR Family Communication:  Status is: Inpatient    Dispo: The patient is from: Home              Anticipated d/c is to: Home vs SNF              Anticipated d/c date is: 6/21  Patient currently unstable      Consultants:  Palliative care    Procedures/Significant Events:  6/17 MRI brain W0 contrast:No evidence of acute intracranial abnormality. -Redemonstrated small chronic cortical/subcortical infarct within left parietal lobe, chronic small-vessel infarct within the right parietal lobe and chronic infarct within the ventromedial right thalamus/right midbrain.   Stable background mild-to-moderate generalized parenchymal atrophy and moderate cerebral white matter chronic small vessel ischemic disease. Mild chronic  small vessel ischemic changes within the pons are also stable.   The axial SWI sequence is motion degraded. Small foci of chronic hemorrhage are questioned within the bilateral parietal lobes (versus artifact). 6/18 Echocardiogram; Left Ventricle: LVEF= 50 to 55%. The left ventricle has low normal function. The left ventricle has  no regional wall motion abnormalities. The left ventricular internal  cavity size was normal in size.  There is mild left ventricular hypertrophy. Left ventricular diastolic  parameters are consistent with Grade I diastolic dysfunction (impaired  relaxation). Indeterminate filling pressures.   Right Ventricle: The right ventricular size is normal. No increase in  right ventricular wall thickness. Right ventricular systolic function is  mildly reduced. Tricuspid regurgitation signal is inadequate for assessing  PA pressure.   I have personally reviewed and interpreted all radiology studies and my findings are as above.  VENTILATOR SETTINGS:    Cultures 6/17 urine positive Proteus mirabilis 6/17 blood pending 6/17 SARS coronavirus negative 6/17 influenza A/B negative   Antimicrobials: Anti-infectives (From admission, onward)    Start     Ordered Stop   10/25/20 1700  cefTRIAXone (ROCEPHIN) 1 g in sodium chloride 0.9 % 100 mL IVPB        10/24/20 1929     10/24/20 1630  cefTRIAXone (ROCEPHIN) 1 g in sodium chloride 0.9 % 100 mL IVPB        10/24/20 1623 10/24/20 1934         Devices    LINES / TUBES:      Continuous Infusions:  cefTRIAXone (ROCEPHIN)  IV Stopped (10/25/20 1900)     Objective: Vitals:   10/26/20 0726 10/26/20 0800 10/26/20 1143 10/26/20 1644  BP: 99/70 112/78 107/69 119/73  Pulse: 72  83 78  Resp: 18  18 18   Temp: 98.7 F (37.1 C)  98.6 F (37 C)   TempSrc: Oral  Oral Oral  SpO2: 98%  100% 99%  Weight:      Height:        Intake/Output Summary (Last 24 hours) at 10/26/2020 1804 Last data filed at 10/25/2020  2130 Gross per 24 hour  Intake 120 ml  Output --  Net 120 ml    Filed Weights   10/24/20 1143 10/26/20 0500  Weight: 44.5 kg 43.3 kg    Examination:  General: A/O x4, No acute respiratory distress, cachectic Eyes: negative scleral hemorrhage, negative anisocoria, negative icterus ENT: Negative Runny nose, negative gingival bleeding, Positive hard of hearing Neck:  Negative scars, masses, torticollis, lymphadenopathy, JVD Lungs: Clear to auscultation bilaterally without wheezes or crackles Cardiovascular: Regular rate and rhythm without murmur gallop or rub normal S1 and S2 Abdomen: negative abdominal pain, nondistended, positive soft, bowel sounds, no rebound, no ascites, no appreciable mass Extremities: No significant cyanosis, clubbing, or edema bilateral lower extremities Skin: Negative rashes, lesions, ulcers Psychiatric:  Negative depression, negative anxiety, negative fatigue, negative mania Central nervous system:  Cranial nerves II through XII intact, tongue/uvula midline, all extremities muscle strength 5/5, sensation intact throughout,  negative dysarthria, negative expressive aphasia, negative receptive  aphasia  .     Data Reviewed: Care during the described time interval was provided by me .  I have reviewed this patient's available data, including medical history, events of note, physical examination, and all test results as part of my evaluation.  CBC: Recent Labs  Lab 10/24/20 1340 10/24/20 1725 10/25/20 0009 10/26/20 0030  WBC 11.4* 9.4 7.9 6.3  NEUTROABS 8.4*  --  5.0 3.2  HGB 12.4 11.8* 11.4* 9.3*  HCT 39.5 37.5 35.3* 29.6*  MCV 97.3 97.4 97.0 96.7  PLT 361 333 292 283    Basic Metabolic Panel: Recent Labs  Lab 10/24/20 1340 10/24/20 1725 10/25/20 0009 10/26/20 0030  NA 139  --  138 135  K 5.3*  --  4.8 4.8  CL 107  --  105 102  CO2 23  --  22 25  GLUCOSE 66*  --  265* 249*  BUN 21*  --  22* 30*  CREATININE 1.30* 1.26* 1.25* 1.37*   CALCIUM 10.0  --  9.4 9.0  MG  --   --  2.2 2.1  PHOS  --   --  3.3 3.9    GFR: Estimated Creatinine Clearance: 30.6 mL/min (A) (by C-G formula based on SCr of 1.37 mg/dL (H)). Liver Function Tests: Recent Labs  Lab 10/24/20 1340 10/25/20 0009 10/26/20 0030  AST 33 28 24  ALT 27 22 23   ALKPHOS 80 71 60  BILITOT 0.8 1.1 0.2*  PROT 8.2* 7.2 6.1*  ALBUMIN 3.9 3.3* 2.8*    No results for input(s): LIPASE, AMYLASE in the last 168 hours. No results for input(s): AMMONIA in the last 168 hours. Coagulation Profile: Recent Labs  Lab 10/24/20 1340  INR 0.9    Cardiac Enzymes: No results for input(s): CKTOTAL, CKMB, CKMBINDEX, TROPONINI in the last 168 hours. BNP (last 3 results) No results for input(s): PROBNP in the last 8760 hours. HbA1C: No results for input(s): HGBA1C in the last 72 hours. CBG: Recent Labs  Lab 10/25/20 1625 10/25/20 2056 10/26/20 0714 10/26/20 1141 10/26/20 1635  GLUCAP 273* 220* 305* 276* 288*    Lipid Profile: No results for input(s): CHOL, HDL, LDLCALC, TRIG, CHOLHDL, LDLDIRECT in the last 72 hours. Thyroid Function Tests: No results for input(s): TSH, T4TOTAL, FREET4, T3FREE, THYROIDAB in the last 72 hours. Anemia Panel: No results for input(s): VITAMINB12, FOLATE, FERRITIN, TIBC, IRON, RETICCTPCT in the last 72 hours. Sepsis Labs: No results for input(s): PROCALCITON, LATICACIDVEN in the last 168 hours.  Recent Results (from the past 240 hour(s))  Resp Panel by RT-PCR (Flu A&B, Covid) Nasopharyngeal Swab     Status: None   Collection Time: 10/24/20 12:48 PM   Specimen: Nasopharyngeal Swab; Nasopharyngeal(NP) swabs in vial transport medium  Result Value Ref Range Status   SARS Coronavirus 2 by RT PCR NEGATIVE NEGATIVE Final    Comment: (NOTE) SARS-CoV-2 target nucleic acids are NOT DETECTED.  The SARS-CoV-2 RNA is generally detectable in upper respiratory specimens during the acute phase of infection. The lowest concentration of  SARS-CoV-2 viral copies this assay can detect is 138 copies/mL. A negative result does not preclude SARS-Cov-2 infection and should not be used as the sole basis for treatment or other patient management decisions. A negative result may occur with  improper specimen collection/handling, submission of specimen other than nasopharyngeal swab, presence of viral mutation(s) within the areas targeted by this assay, and inadequate number of viral copies(<138 copies/mL). A negative result must be combined with clinical observations, patient  history, and epidemiological information. The expected result is Negative.  Fact Sheet for Patients:  BloggerCourse.com  Fact Sheet for Healthcare Providers:  SeriousBroker.it  This test is no t yet approved or cleared by the [place] FDA and  has been authorized for detection and/or diagnosis of SARS-CoV-2 by FDA under an Emergency Use Authorization (EUA). This EUA will remain  in effect (meaning this test can be used) for the duration of the COVID-19 declaration under Section 564(b)(1) of the Act, 21 U.S.C.section 360bbb-3(b)(1), unless the authorization is terminated  or revoked sooner.       Influenza A by PCR NEGATIVE NEGATIVE Final   Influenza B by PCR NEGATIVE NEGATIVE Final    Comment: (NOTE) The Xpert Xpress SARS-CoV-2/FLU/RSV plus assay is intended as an aid in the diagnosis of influenza from Nasopharyngeal swab specimens and should not be used as a sole basis for treatment. Nasal washings and aspirates are unacceptable for Xpert Xpress SARS-CoV-2/FLU/RSV testing.  Fact Sheet for Patients: BloggerCourse.com  Fact Sheet for Healthcare Providers: SeriousBroker.it  This test is not yet approved or cleared by the [place] FDA and has been authorized for detection and/or diagnosis of SARS-CoV-2 by FDA under an Emergency Use  Authorization (EUA). This EUA will remain in effect (meaning this test can be used) for the duration of the COVID-19 declaration under Section 564(b)(1) of the Act, 21 U.S.C. section 360bbb-3(b)(1), unless the authorization is terminated or revoked.  Performed at [facility], [address], [place], [place] [address]   Urine culture     Status: Abnormal   Collection Time: 10/24/20  1:00 PM   Specimen: Urine, Clean Catch  Result Value Ref Range Status   Specimen Description URINE, CLEAN CATCH  Final   Special Requests   Final    NONE Performed at [facility], [address] [place], [place] [address]    Culture >=100,000 COLONIES/mL PROTEUS MIRABILIS (A)  Final   Report Status 10/26/2020 FINAL  Final   Organism ID, Bacteria PROTEUS MIRABILIS (A)  Final      Susceptibility   Proteus mirabilis - MIC*    AMPICILLIN <=2 SENSITIVE Sensitive     CEFAZOLIN <=4 SENSITIVE Sensitive     CEFEPIME <=0.12 SENSITIVE Sensitive     CEFTRIAXONE <=0.25 SENSITIVE Sensitive     CIPROFLOXACIN <=0.25 SENSITIVE Sensitive     GENTAMICIN <=1 SENSITIVE Sensitive     IMIPENEM 2 SENSITIVE Sensitive     NITROFURANTOIN 128 RESISTANT Resistant     TRIMETH/SULFA <=20 SENSITIVE Sensitive     AMPICILLIN/SULBACTAM <=2 SENSITIVE Sensitive     PIP/TAZO <=4 SENSITIVE Sensitive     * >=100,000 COLONIES/mL PROTEUS MIRABILIS  Culture, blood (routine x 2)     Status: None (Preliminary result)   Collection Time: 10/24/20  9:00 PM   Specimen: BLOOD  Result Value Ref Range Status   Specimen Description BLOOD RIGHT ARM  Final   Special Requests   Final    BOTTLES DRAWN AEROBIC AND ANAEROBIC Blood Culture results may not be optimal due to an inadequate volume of blood received in culture bottles   Culture   Final    NO GROWTH 2 DAYS Performed at [facility], [address] [place], [place] [address]    Report Status PENDING  Incomplete  Culture, blood (routine x 2)     Status: None  (Preliminary result)   Collection Time: 10/24/20  9:18 PM   Specimen: BLOOD  Result Value Ref  Range Status   Specimen Description BLOOD LEFT HAND  Final   Special Requests   Final    BOTTLES DRAWN AEROBIC ONLY Blood Culture results may not be optimal due to an inadequate volume of blood received in culture bottles   Culture   Final    NO GROWTH 2 DAYS Performed at [facility], [address] [place], [place] [address]    Report Status PENDING  Incomplete          Radiology Studies: MR BRAIN WO CONTRAST  Result Date: 10/24/2020 CLINICAL DATA:  Neuro deficit, acute, stroke suspected. EXAM: MRI HEAD WITHOUT CONTRAST TECHNIQUE: Multiplanar, multiecho pulse sequences of the brain and surrounding structures were obtained without intravenous contrast. COMPARISON:  Prior head CT examinations 10/24/2020 and earlier. Brain MRI 03/11/2020. FINDINGS: Brain: Mild intermittent motion degradation. Mild-to-moderate cerebral and cerebellar atrophy. Redemonstrated chronic infarct within the ventromedial right thalamus and right midbrain. There is a small amount of residual restricted diffusion along the periphery of the thalamic component of this infarct. Small chronic cortical/subcortical infarct within the left parietal lobe. Chronic small-vessel infarct within the right parietal white matter. Background moderate multifocal T2/FLAIR hyperintensity within the cerebral white matter, nonspecific but compatible with chronic small vessel ischemic disease. To a lesser degree, mild chronic small vessel ischemic changes are also present within the pons. The axial SWI sequence is motion degraded. Small foci of chronic hemosiderin deposition are questioned within the bilateral parietal lobes. There is no acute infarct. No evidence of intracranial mass. No extra-axial fluid collection. No midline shift. Vascular: Expected proximal arterial flow voids. Skull and upper cervical spine: No focal marrow lesion.  Sinuses/Orbits: Visualized orbits show no acute finding. Opacification of a left ethmoid air cell. IMPRESSION: No evidence of acute intracranial abnormality. Redemonstrated small chronic cortical/subcortical infarct within the left parietal lobe, chronic small-vessel infarct within the right parietal lobe and chronic infarct within the ventromedial right thalamus/right midbrain. Stable background mild-to-moderate generalized parenchymal atrophy and moderate cerebral white matter chronic small vessel ischemic disease. Mild chronic small vessel ischemic changes within the pons are also stable. The axial SWI sequence is motion degraded. Small foci of chronic hemorrhage are questioned within the bilateral parietal lobes (versus artifact). Electronically Signed   By: Jackey Loge DO   On: 10/24/2020 18:34   ECHOCARDIOGRAM COMPLETE  Result Date: 10/25/2020    ECHOCARDIOGRAM REPORT   Patient Name:   MONETTE OMARA Date of Exam: 10/25/2020 Medical Rec #:  [id number]       Height:       66.0 in Accession #:    [id number]      Weight:       98.0 lb Date of Birth:  Oct 03, 1960       BSA:          1.477 m Patient Age:    59 years        BP:           90/58 mmHg Patient Gender: F               HR:           86 bpm. Exam Location:  Inpatient Procedure: 2D Echo, Color Doppler and Cardiac Doppler Indications:    CHF-Acute Systolic I50.21  History:        Patient has prior history of Echocardiogram examinations, most                 recent 03/12/2020. CHF and Cardiomyopathy, Pulmonary HTN;  Risk                 Factors:Hypertension, Diabetes and Current Smoker.  Sonographer:    Eulah PontSarah Pirrotta RDCS Referring Phys: [id number] Roby Spalla J Daralyn Bert IMPRESSIONS  1. Left ventricular ejection fraction, by estimation, is 50 to 55%. The left ventricle has low normal function. The left ventricle has no regional wall motion abnormalities. There is mild left ventricular hypertrophy. Left ventricular diastolic parameters are consistent with Grade I  diastolic dysfunction (impaired relaxation).  2. Right ventricular systolic function is mildly reduced. The right ventricular size is normal. Tricuspid regurgitation signal is inadequate for assessing PA pressure.  3. Left atrial size was mildly dilated.  4. The mitral valve is grossly normal. Trivial mitral valve regurgitation.  5. The aortic valve is tricuspid. Aortic valve regurgitation is not visualized.  6. The inferior vena cava is normal in size with greater than 50% respiratory variability, suggesting right atrial pressure of 3 mmHg. Comparison(s): Changes from prior study are noted. 03/12/2020: LVEF 20-25%. FINDINGS  Left Ventricle: Left ventricular ejection fraction, by estimation, is 50 to 55%. The left ventricle has low normal function. The left ventricle has no regional wall motion abnormalities. The left ventricular internal cavity size was normal in size. There is mild left ventricular hypertrophy. Left ventricular diastolic parameters are consistent with Grade I diastolic dysfunction (impaired relaxation). Indeterminate filling pressures. Right Ventricle: The right ventricular size is normal. No increase in right ventricular wall thickness. Right ventricular systolic function is mildly reduced. Tricuspid regurgitation signal is inadequate for assessing PA pressure. Left Atrium: Left atrial size was mildly dilated. Right Atrium: Right atrial size was normal in size. Pericardium: There is no evidence of pericardial effusion. Mitral Valve: The mitral valve is grossly normal. Trivial mitral valve regurgitation. Tricuspid Valve: The tricuspid valve is grossly normal. Tricuspid valve regurgitation is trivial. Aortic Valve: The aortic valve is tricuspid. Aortic valve regurgitation is not visualized. Pulmonic Valve: The pulmonic valve was grossly normal. Pulmonic valve regurgitation is not visualized. Aorta: The aortic root and ascending aorta are structurally normal, with no evidence of dilitation. Venous:  The inferior vena cava is normal in size with greater than 50% respiratory variability, suggesting right atrial pressure of 3 mmHg. IAS/Shunts: No atrial level shunt detected by color flow Doppler.  LEFT VENTRICLE PLAX 2D LVIDd:         3.10 cm  Diastology LVIDs:         2.30 cm  LV e' medial:    4.39 cm/s LV PW:         1.10 cm  LV E/e' medial:  9.1 LV IVS:        0.90 cm  LV e' lateral:   4.67 cm/s LVOT diam:     2.00 cm  LV E/e' lateral: 8.5 LV SV:         45 LV SV Index:   31 LVOT Area:     3.14 cm  RIGHT VENTRICLE RV S prime:     8.16 cm/s TAPSE (M-mode): 1.8 cm LEFT ATRIUM             Index       RIGHT ATRIUM          Index LA diam:        2.50 cm 1.69 cm/m  RA Area:     9.82 cm LA Vol (A2C):   24.3 ml 16.45 ml/m RA Volume:   21.80 ml 14.76 ml/m LA Vol (A4C):   23.6  ml 15.98 ml/m LA Biplane Vol: 25.7 ml 17.40 ml/m  AORTIC VALVE LVOT Vmax:   94.30 cm/s LVOT Vmean:  59.300 cm/s LVOT VTI:    0.144 m  AORTA Ao Root diam: 2.60 cm Ao Asc diam:  2.70 cm MITRAL VALVE MV Area (PHT): 2.13 cm    SHUNTS MV Decel Time: 356 msec    Systemic VTI:  0.14 m MV E velocity: 39.80 cm/s  Systemic Diam: 2.00 cm MV A velocity: 78.20 cm/s MV E/A ratio:  0.51 Zoila Shutter MD Electronically signed by Zoila Shutter MD Signature Date/Time: 10/25/2020/3:51:47 PM    Final         Scheduled Meds:  aspirin EC  81 mg Oral Daily   dronabinol  5 mg Oral BID AC   enoxaparin (LOVENOX) injection  30 mg Subcutaneous Q24H   insulin aspart  0-5 Units Subcutaneous QHS   insulin aspart  0-6 Units Subcutaneous TID WC   Continuous Infusions:  cefTRIAXone (ROCEPHIN)  IV Stopped (10/25/20 1900)     LOS: 0 days    Time spent:40 min    Lasondra Hodgkins, Roselind Messier, MD [facility]   If 7PM-7AM, please contact night-coverage 10/26/2020, 6:04 PM

## 2020-10-26 NOTE — Progress Notes (Signed)
Palliative Medicine Inpatient Follow Up Note  Reason for consult:  "Consider advanced care directives to not return to hospital secondary to weakness brought on by anorexia  vs hospice"   HPI:  Per intake H&P -->  60 year old Christine Benitez (from long-term care facility) PMHx HTN,, severe chronic systolic CHF (EF 20 to 98%), NICM, pulmonary HTN, DM type II uncontrolled with hyperglycemia, thalamic stroke in November 2021, tobacco abuse, chronic cholecystitis with calculus. Presented for weakness - has suffered from ongoing anorexia. Palliative care was consulted to further address goals of care.   Of note, [name] was seen in November 2021 post thalmic cerebral infarction - during that admission a DNR was obtained.   Today's Discussion (10/26/2020):  *Please note that this is a verbal dictation therefore any spelling or grammatical errors are due to the "[place] [employer]" system interpretation.  Chart reviewed. Eating fairly well - 75% of meals, mobilizing.   I met with [name] this morning she was elated that she had already worked with OT and was up in the chair. We reviewed her weakness though she states that per her conversations with PT/OT she should be stable to transition home.  I was able to discuss with [name] comprehensively what her heart failure means at the stage it is at. We reviewed her ejection fraction and how she is more weak and has decreased appetite likely are all in the setting of end stage heart failure. She understood and agreed with this. We reviewed hospice care.  I described hospice as a service for patients for have a life expectancy of < 6 months. It preserves dignity and quality at the end phases of life. The focus changes from curative to symptom relief. She states, "I don't want hospice." When I inquired as she why she shared that she was not at a point where she needed it. I shared with her that I believe she is though we could start with her on Palliative services and if her  condition declines then initiate hospice care - she was agreeable to this.   I was able to call [name]. [name]'s mom thereafter. [name] expresses concern that there will be no one who can help care for [name] upon discharge as her daughter, [name] is hospitalized in [place] and will likely need rehabilitation thereafter. She shares that she herself is 60 years old and barely "making it" herself. I inquired as to other family members none of whom would be able to help with Jeris's care after hospitalization.   We reviewed that I will request that the social worker speak to her to more formally institute a plan from her. [name] agree's with hospice for her daughter when the time comes.   Objective Assessment: Vital Signs Vitals:   10/26/20 0726 10/26/20 0800  BP: 99/70 112/78  Pulse: 72   Resp: 18   Temp: 98.7 F (37.1 C)   SpO2: 98%     Intake/Output Summary (Last 24 hours) at 10/26/2020 1049 Last data filed at 10/25/2020 2130 Gross per 24 hour  Intake 285.23 ml  Output --  Net 285.23 ml   Last Weight  Most recent update: 10/26/2020  5:29 AM    Weight  43.3 kg (95 lb 7.4 oz)            Gen:  AA F appears older than stated age 43: moist mucous membranes CV: Regular rate and rhythm, no murmurs rubs or gallops PULM: clear to auscultation bilaterally ABD: soft/nontender EXT: No edema Neuro:  Alert and oriented x3  SUMMARY OF RECOMMENDATIONS   DNAR/DNI   MOST Completed, paper copy placed onto the chart electric copy can be found in [facility]   DNR Form Completed, paper copy placed onto the chart electric copy can be found in [facility]   Request to Spiritual for Advance Directives   Generalized Weakness - PT/OT   Malnutrition - Dietary Eval. Start Marinol $RemoveBefor'5mg'LwuhhjGRIGCf$  PO BID   Social - Patients primary CG is now hospitalized   TOC - OP Palliative Care with the knowledge that this will eventually evolve into hospice care based on disease progression   Ongoing support  Time Spent:  67 Greater than 50% of the time was spent in counseling and coordination of care ______________________________________________________________________________________ [place] Team Team Cell Phone: [phone] Please utilize secure chat with additional questions, if there is no response within 30 minutes please call the above phone number  Palliative Medicine Team providers are available by phone from 7am to 7pm daily and can be reached through the team cell phone.  Should this patient require assistance outside of these hours, please call the patient's attending physician.

## 2020-10-26 NOTE — Evaluation (Signed)
Occupational Therapy Evaluation Patient Details Name: Christine Benitez MRN: [id number] DOB: [birth year] Today's Date: 10/26/2020    History of Present Illness 60 y.o. female presents [facility] ED on 10/24/2020 with weakness, slurred speech, confusion. CT and MRI negative. PMH includes HTN,, severe chronic systolic CHF (EF 20 to 25%), NICM, pulmonary HTN, DM type II, thalamic stroke in November 2021, tobacco abuse, chronic cholecystitis with calculus.   Clinical Impression   PTA patient was living with her daughter in a private residence and was completing ADLs without external assist. Patient reports assist required from her daughter for IADLs including meal prep, housekeeping and transportation. Patient currently functioning at baseline demonstrating observed ADLs including toileting/hygiene/clothing management without external assist and use of RW. Noted residual LUE weakness and L inattention (likely baseline). Patient does not require continued acute occupational therapy services at this time with OT to sign off. Recommendation for return home with family. Daughter able to provide necessary supervision/assist.      Follow Up Recommendations  No OT follow up;Supervision - Intermittent    Equipment Recommendations  None recommended by OT    Recommendations for Other Services       Precautions / Restrictions Precautions Precautions: Fall Precaution Comments: L inattention, likely baseline from previous CVA Restrictions Weight Bearing Restrictions: No      Mobility Bed Mobility Overal bed mobility: Independent                  Transfers Overall transfer level: Modified independent Equipment used: Rolling walker (2 wheeled)                  Balance Overall balance assessment: Needs assistance Sitting-balance support: No upper extremity supported;Feet supported Sitting balance-Leahy Scale: Good     Standing balance support: No upper extremity supported Standing  balance-Leahy Scale: Fair Standing balance comment: dons mask without UE support                           ADL either performed or assessed with clinical judgement   ADL Overall ADL's : Needs assistance/impaired     Grooming: Supervision/safety;Standing Grooming Details (indicate cue type and reason): Grooming tasks standing at sink level with supervision A and cues for use of LUE.                 Toilet Transfer: Firefighter Details (indicate cue type and reason): To standard commode in bathroom. Patient able to problem solve needing to take lateral steps to fit RW into bathroom. Toileting- Clothing Manipulation and Hygiene: Supervision/safety;Sit to/from stand Toileting - Clothing Manipulation Details (indicate cue type and reason): Supervision A for safety.     Functional mobility during ADLs: Supervision/safety;Rolling walker       Vision Baseline Vision/History: No visual deficits Vision Assessment?: No apparent visual deficits     Perception Perception Perception Tested?: Yes   Praxis      Pertinent Vitals/Pain Pain Assessment: 0-10 Pain Score: 2  Pain Location: IV site in L forearm Pain Descriptors / Indicators: Sore Pain Intervention(s): Monitored during session     Hand Dominance     Extremity/Trunk Assessment Upper Extremity Assessment Upper Extremity Assessment: LUE deficits/detail LUE Deficits / Details: Residual LUE hemiparesis but able to use LUE funtionally with Min cues. L inattention noted.   Lower Extremity Assessment Lower Extremity Assessment: Defer to PT evaluation   Cervical / Trunk Assessment Cervical / Trunk Assessment: Kyphotic   Communication Communication Communication: [facility]  Cognition Arousal/Alertness: Awake/alert Behavior During Therapy: WFL for tasks assessed/performed Overall Cognitive Status: No family/caregiver present to determine baseline cognitive functioning                                  General Comments: Answers all questions appropriately; follows multi-step verbal commands; demonstrates good memory recalling PT evaluation yesterday. Patient functioning at presumed baseline.   General Comments  VSS on RA.    Exercises     Shoulder Instructions      Home Living Family/patient expects to be discharged to:: Private residence Living Arrangements: Children Available Help at Discharge: Family;Available 24 hours/day Type of Home: House Home Access: Ramped entrance     Home Layout: One level     Bathroom Shower/Tub: Chief Strategy Officer: Standard     Home Equipment: Environmental consultant - 2 wheels          Prior Functioning/Environment Level of Independence: Independent with assistive device(s)        Comments: pt reports ambulating independently with use of RW        OT Problem List:        OT Treatment/Interventions:      OT Goals(Current goals can be found in the care plan section) Acute Rehab OT Goals Patient Stated Goal: To return home. OT Goal Formulation: With patient  OT Frequency:     Barriers to D/C:            Co-evaluation              AM-PAC OT "6 Clicks" Daily Activity     Outcome Measure Help from another person eating meals?: None Help from another person taking care of personal grooming?: A Little Help from another person toileting, which includes using toliet, bedpan, or urinal?: A Little Help from another person bathing (including washing, rinsing, drying)?: A Little Help from another person to put on and taking off regular upper body clothing?: A Little Help from another person to put on and taking off regular lower body clothing?: A Little 6 Click Score: 19   End of Session Equipment Utilized During Treatment: Gait belt;Rolling walker Nurse Communication: Mobility status  Activity Tolerance: Patient tolerated treatment well Patient left: in chair;with call bell/phone within  reach;with chair alarm set  OT Visit Diagnosis: Muscle weakness (generalized) (M62.81)                Time: 2355-7322 OT Time Calculation (min): 24 min Charges:  OT General Charges $OT Visit: 1 Visit OT Evaluation $OT Eval Low Complexity: 1 Low OT Treatments $Self Care/Home Management : 8-22 mins  Rudene Poulsen H. OTR/L Supplemental OT, Department of rehab services [phone]  [facility] [place] R H. 10/26/2020, 8:30 AM

## 2020-10-27 LAB — GLUCOSE, CAPILLARY
Glucose-Capillary: 149 mg/dL — ABNORMAL HIGH (ref 70–99)
Glucose-Capillary: 171 mg/dL — ABNORMAL HIGH (ref 70–99)
Glucose-Capillary: 199 mg/dL — ABNORMAL HIGH (ref 70–99)
Glucose-Capillary: 211 mg/dL — ABNORMAL HIGH (ref 70–99)
Glucose-Capillary: 399 mg/dL — ABNORMAL HIGH (ref 70–99)

## 2020-10-27 LAB — COMPREHENSIVE METABOLIC PANEL
ALT: 20 U/L (ref 0–44)
AST: 19 U/L (ref 15–41)
Albumin: 2.9 g/dL — ABNORMAL LOW (ref 3.5–5.0)
Alkaline Phosphatase: 57 U/L (ref 38–126)
Anion gap: 8 (ref 5–15)
BUN: 31 mg/dL — ABNORMAL HIGH (ref 6–20)
CO2: 24 mmol/L (ref 22–32)
Calcium: 8.9 mg/dL (ref 8.9–10.3)
Chloride: 101 mmol/L (ref 98–111)
Creatinine, Ser: 1.48 mg/dL — ABNORMAL HIGH (ref 0.44–1.00)
GFR, Estimated: 41 mL/min — ABNORMAL LOW (ref >60)
Glucose, Bld: 424 mg/dL — ABNORMAL HIGH (ref 70–99)
Potassium: 5.1 mmol/L (ref 3.5–5.1)
Sodium: 133 mmol/L — ABNORMAL LOW (ref 135–145)
Total Bilirubin: 0.4 mg/dL (ref 0.3–1.2)
Total Protein: 6.2 g/dL — ABNORMAL LOW (ref 6.5–8.1)

## 2020-10-27 LAB — CBC WITH DIFFERENTIAL/PLATELET
Abs Immature Granulocytes: 0.01 10*3/uL (ref 0.00–0.07)
Basophils Absolute: 0 10*3/uL (ref 0.0–0.1)
Basophils Relative: 1 %
Eosinophils Absolute: 0.2 10*3/uL (ref 0.0–0.5)
Eosinophils Relative: 3 %
HCT: 29.6 % — ABNORMAL LOW (ref 36.0–46.0)
Hemoglobin: 9.5 g/dL — ABNORMAL LOW (ref 12.0–15.0)
Immature Granulocytes: 0 %
Lymphocytes Relative: 35 %
Lymphs Abs: 2 10*3/uL (ref 0.7–4.0)
MCH: 30.9 pg (ref 26.0–34.0)
MCHC: 32.1 g/dL (ref 30.0–36.0)
MCV: 96.4 fL (ref 80.0–100.0)
Monocytes Absolute: 0.5 10*3/uL (ref 0.1–1.0)
Monocytes Relative: 9 %
Neutro Abs: 3 10*3/uL (ref 1.7–7.7)
Neutrophils Relative %: 52 %
Platelets: 276 10*3/uL (ref 150–400)
RBC: 3.07 MIL/uL — ABNORMAL LOW (ref 3.87–5.11)
RDW: 12.5 % (ref 11.5–15.5)
WBC: 5.7 10*3/uL (ref 4.0–10.5)
nRBC: 0 % (ref 0.0–0.2)

## 2020-10-27 LAB — HEMOGLOBIN A1C
Hgb A1c MFr Bld: 7.7 % — ABNORMAL HIGH (ref 4.8–5.6)
Mean Plasma Glucose: 174 mg/dL

## 2020-10-27 LAB — PHOSPHORUS: Phosphorus: 3.6 mg/dL (ref 2.5–4.6)

## 2020-10-27 LAB — MAGNESIUM: Magnesium: 2 mg/dL (ref 1.7–2.4)

## 2020-10-27 MED ORDER — INSULIN GLARGINE 100 UNIT/ML ~~LOC~~ SOLN
24.0000 [IU] | Freq: Every day | SUBCUTANEOUS | Status: DC
  Administered 2020-10-27: 24 [IU] via SUBCUTANEOUS
  Filled 2020-10-27 (×2): qty 0.24

## 2020-10-27 MED ORDER — INSULIN ASPART 100 UNIT/ML IJ SOLN
10.0000 [IU] | Freq: Three times a day (TID) | INTRAMUSCULAR | Status: DC
  Administered 2020-10-28 (×2): 10 [IU] via SUBCUTANEOUS

## 2020-10-27 MED ORDER — INSULIN ASPART 100 UNIT/ML IJ SOLN
0.0000 [IU] | INTRAMUSCULAR | Status: DC
  Administered 2020-10-27: 5 [IU] via SUBCUTANEOUS
  Administered 2020-10-27 – 2020-10-28 (×3): 3 [IU] via SUBCUTANEOUS

## 2020-10-27 NOTE — Progress Notes (Addendum)
This chaplain responded PMT consult for creating or updating the Pt. Advance Directive.  The Pt. Is sleeping at the time of the visit and did not wake up to the call of her name.  This chaplain will attempt to F/U today.  **1320 This chaplain is present at the Pt. bedside for F/U spiritual care.  The Pt. is awake and participates in conversation with the chaplain.  The chaplain understands from the Pt., PT has ended the physical therapy relationship with the Pt.  The Pt. also updated the chaplain she is trying to eat more and enjoyed her macaroni and cheese today at lunch. The Pt. is anticipating returning home with the Pt. daughter-LaTori and neighbor-Carol as caregivers.  The chaplain understands the Pt. choice for HCPOA is the Pt.daughter-LaTori. The Pt. recognizes [name] is in the hospital and may not be the person with the most knowledge of the Pt. healthcare.  The Pt. asked the chaplain to phone the Pt. mother-[name] for an update on LaTori.   The chaplain understands from [employer], LaTori's health is making progress. [name] informs the chaplain she has the role of healthcare decision maker from the Pt. previous admission. The chaplain encouraged [name] to bring the paperwork to the hospital for scanning into the Pt. EMR.   [name] remains concerned LaTori will not be able to continue as the Pt. caregiver. [name] requests the assistance of transitions of care team for connections to financial and caregiver resources.

## 2020-10-27 NOTE — Progress Notes (Signed)
   Palliative Medicine Inpatient Follow Up Note  Reason for consult:  "Consider advanced care directives to not return to hospital secondary to weakness brought on by anorexia  vs hospice"   HPI:  Per intake H&P -->  60 year old BF (from long-term care facility) PMHx HTN,, severe chronic systolic CHF (EF 20 to 25%), NICM, pulmonary HTN, DM type II uncontrolled with hyperglycemia, thalamic stroke in November 2021, tobacco abuse, chronic cholecystitis with calculus. Presented for weakness - has suffered from ongoing anorexia. Palliative care was consulted to further address goals of care.   Of note, Layci was seen in November 2021 post thalmic cerebral infarction - during that admission a DNR was obtained.   Today's Discussion (10/27/2020):  *Please note that this is a verbal dictation therefore any spelling or grammatical errors are due to the "[employer]" system interpretation.  Chart reviewed.   I spoke to patients bedside RN, Huston Foley she shares that Phaedra has been snacking in between meals with the addition of Marinol. She is getting OOB to chair and going to the commode.   Per assessment, Mercie is noted to be resting in bed in NAD. She has endorses the desire to continue moving more.   Plan for discharge is presently uncertain. We will continue to obtain updates on her daughter status. Per PT/OT no additional rehabilitation is needed.   Objective Assessment: Vital Signs Vitals:   10/27/20 0729 10/27/20 1133  BP: 100/62 108/67  Pulse: 61 88  Resp: 17 19  Temp: (!) 97.1 F (36.2 C) 98.3 F (36.8 C)  SpO2: 100% 97%    Intake/Output Summary (Last 24 hours) at 10/27/2020 1533 Last data filed at 10/27/2020 5784 Gross per 24 hour  Intake 709.93 ml  Output 800 ml  Net -90.07 ml    Last Weight  Most recent update: 10/27/2020  6:05 AM    Weight  45.9 kg (101 lb 3.1 oz)            Gen:  AA F appears older than stated age HEENT: moist mucous membranes CV: Regular rate and  rhythm, no murmurs rubs or gallops PULM: clear to auscultation bilaterally ABD: soft/nontender EXT: No edema Neuro: Alert and oriented x3  SUMMARY OF RECOMMENDATIONS   DNAR/DNI   MOST Completed, paper copy placed onto the chart electric copy can be found in [facility]   DNR Form Completed, paper copy placed onto the chart electric copy can be found in [facility]yn[facility]   Request to Spiritual for Advance Directives   Generalized Weakness - PT/OT --> no Skilled needs per evals   Malnutrition - Dietary Eval. Start Marinol 5mg  PO BID   Social - Patients primary CG is now hospitalized   TOC - OP Palliative Care with the knowledge that this will eventually evolve into hospice care based on disease progression   Ongoing incremental support  Time Spent: 25 Greater than 50% of the time was spent in counseling and coordination of care ______________________________________________________________________________________ [place] Palliative Medicine Team Team Cell Phone: [phone] Please utilize secure chat with additional questions, if there is no response within 30 minutes please call the above phone number  Palliative Medicine Team providers are available by phone from 7am to 7pm daily and can be reached through the team cell phone.  Should this patient require assistance outside of these hours, please call the patient's attending physician.

## 2020-10-27 NOTE — Plan of Care (Signed)
?  Problem: Clinical Measurements: ?Goal: Will remain free from infection ?Outcome: Progressing ?  ?Problem: Clinical Measurements: ?Goal: Diagnostic test results will improve ?Outcome: Progressing ?  ?

## 2020-10-27 NOTE — Progress Notes (Signed)
PROGRESS NOTE    Christine Benitez  ACZ:[id number] DOB: Oct 08, 1960 DOA: 10/24/2020 PCP: Lupita Dawn, MD     Brief Narrative:  60 year old BF (from long-term care facility) PMHx HTN,, severe chronic systolic CHF (EF 20 to 25%), NICM, pulmonary HTN, DM type II uncontrolled with hyperglycemia, thalamic stroke in November 2021, tobacco abuse, chronic cholecystitis with calculus,   Presents with weakness.  She states that she woke up this morning feeling weak all over.  She cannot say if it is 1 side or the other.  Her family had noticed some slurred speech and maybe some confusion.  Her last known normal was 7 PM yesterday.  On chart review, it was noted that she had had some residual weakness from her prior stroke and left side facial drooping although patient says that she normally does not have any weakness.  She denies any fevers.  No cough or cold symptoms.  No pain anywhere.  No abdominal pain.  No vomiting or diarrhea.   Patient states has not been eating, and she has been feeling fatigued, weak.  Brother called 911 because patient was unable to eat/move around.   Subjective: 6/20 afebrile overnight, A/O x4, patient states she has been consuming the majority of her meals while here.  States does not have insurance to purchase her medication for home. (only weighs 45.9 kg)    Assessment & Plan: Covid vaccination; vaccinated 2/3   Principal Problem:   Acute lower UTI Active Problems:   Tobacco abuse   Hypertension   NICM (nonischemic cardiomyopathy) (HCC)   Pulmonary hypertension (HCC)   Chronic cholecystitis with calculus   History of CVA (cerebrovascular accident)   Type 2 diabetes mellitus with complication, with long-term current use of insulin (HCC)   Chronic systolic CHF (congestive heart failure) (HCC)   Urinary tract infection   Failure to thrive in adult   Proteus mirabilis infection    Acute lower UTI positive Proteus mirabilis -Urinalysis is dirty.  Continue empiric  antibiotic urine cultures pending -6/19 complete 5-day course antibiotics   Failure to thrive -Patient admits that she is suffering from anorexia most likely secondary to her severe chronic systolic CHF. - Hospice care consult: Consider advanced care directives to not return to hospital secondary to weakness brought on by anorexia  vs hospice  Chronic systolic CHF/NICM/pulmonary HTN -Echocardiogram 03/12/2020 EF 20 to 25% - Older females with worsening cardiac function can present with fatigue, anorexia vs chest pain - Trend troponin - 6/18 Echocardiogram: Shows improved EF 50 to 55% see results below. - Even without worsening cardiac function anorexia/weight loss poor prognostic indicator. - Strict in and out +535.2 mL - Daily weight Filed Weights   10/26/20 0500 10/27/20 0411 10/27/20 0605  Weight: 43.3 kg 46 kg 45.9 kg  -Schedule follow-up appointment in 2 weeks with DO Kardie Tobb cardiologist chronic systolic CHF, and ICM, pulmonary HTN   Hx CVA -MRI negative for acute stroke.   DM type II with complication, with long-term use of insulin -03/12/2020 hemoglobin A1c= 7.3 - 6/20 increase Lantus 24 units daily -6/20 NovoLog 10 units qac -6/20 moderate SSI     Cachectic (BMI 15.82 kg/m) - Poor prognostic factor into patient with severe heart failure.       DVT prophylaxis: Lovenox Code Status: DNR Family Communication: 6/20 brother at bedside for discussion of plan of care all questions answered Status is: Inpatient    Dispo: The patient is from: Home  Anticipated d/c is to: Home               Anticipated d/c date is: 6/21              Patient currently unstable      Consultants:  Palliative care    Procedures/Significant Events:  6/17 MRI brain W0 contrast:No evidence of acute intracranial abnormality. -Redemonstrated small chronic cortical/subcortical infarct within left parietal lobe, chronic small-vessel infarct within the right parietal lobe  and chronic infarct within the ventromedial right thalamus/right midbrain.   Stable background mild-to-moderate generalized parenchymal atrophy and moderate cerebral white matter chronic small vessel ischemic disease. Mild chronic small vessel ischemic changes within the pons are also stable.   The axial SWI sequence is motion degraded. Small foci of chronic hemorrhage are questioned within the bilateral parietal lobes (versus artifact). 6/18 Echocardiogram; Left Ventricle: LVEF= 50 to 55%. The left ventricle has low normal function. The left ventricle has  no regional wall motion abnormalities. The left ventricular internal  cavity size was normal in size.  There is mild left ventricular hypertrophy. Left ventricular diastolic  parameters are consistent with Grade I diastolic dysfunction (impaired  relaxation). Indeterminate filling pressures.   Right Ventricle: The right ventricular size is normal. No increase in  right ventricular wall thickness. Right ventricular systolic function is  mildly reduced. Tricuspid regurgitation signal is inadequate for assessing  PA pressure.   I have personally reviewed and interpreted all radiology studies and my findings are as above.  VENTILATOR SETTINGS:    Cultures 6/17 urine positive Proteus mirabilis 6/17 blood pending 6/17 SARS coronavirus negative 6/17 influenza A/B negative   Antimicrobials: Anti-infectives (From admission, onward)    Start     Ordered Stop   10/25/20 1700  cefTRIAXone (ROCEPHIN) 1 g in sodium chloride 0.9 % 100 mL IVPB        10/24/20 1929     10/24/20 1630  cefTRIAXone (ROCEPHIN) 1 g in sodium chloride 0.9 % 100 mL IVPB        10/24/20 1623 10/24/20 1934         Devices    LINES / TUBES:      Continuous Infusions:  sodium chloride Stopped (10/26/20 2200)   cefTRIAXone (ROCEPHIN)  IV Stopped (10/26/20 2100)     Objective: Vitals:   10/27/20 0411 10/27/20 0605 10/27/20 0729 10/27/20 1133   BP: 107/70  100/62 108/67  Pulse: 77 63 61 88  Resp: 17 16 17 19   Temp: 98.1 F (36.7 C)  (!) 97.1 F (36.2 C) 98.3 F (36.8 C)  TempSrc: Oral  Oral Axillary  SpO2: 100% 100% 100% 97%  Weight: 46 kg 45.9 kg    Height:        Intake/Output Summary (Last 24 hours) at 10/27/2020 1616 Last data filed at 10/27/2020 10/29/2020 Gross per 24 hour  Intake 709.93 ml  Output 800 ml  Net -90.07 ml    Filed Weights   10/26/20 0500 10/27/20 0411 10/27/20 0605  Weight: 43.3 kg 46 kg 45.9 kg    Examination:  General: A/O x4, No acute respiratory distress, cachectic Eyes: negative scleral hemorrhage, negative anisocoria, negative icterus ENT: Negative Runny nose, negative gingival bleeding, Positive hard of hearing Neck:  Negative scars, masses, torticollis, lymphadenopathy, JVD Lungs: Clear to auscultation bilaterally without wheezes or crackles Cardiovascular: Regular rate and rhythm without murmur gallop or rub normal S1 and S2 Abdomen: negative abdominal pain, nondistended, positive soft, bowel sounds, no rebound, no ascites, no  appreciable mass Extremities: No significant cyanosis, clubbing, or edema bilateral lower extremities Skin: Negative rashes, lesions, ulcers Psychiatric:  Negative depression, negative anxiety, negative fatigue, negative mania Central nervous system:  Cranial nerves II through XII intact, tongue/uvula midline, all extremities muscle strength 5/5, sensation intact throughout,  negative dysarthria, negative expressive aphasia, negative receptive aphasia  .     Data Reviewed: Care during the described time interval was provided by me .  I have reviewed this patient's available data, including medical history, events of note, physical examination, and all test results as part of my evaluation.  CBC: Recent Labs  Lab 10/24/20 1340 10/24/20 1725 10/25/20 0009 10/26/20 0030 10/27/20 0119  WBC 11.4* 9.4 7.9 6.3 5.7  NEUTROABS 8.4*  --  5.0 3.2 3.0  HGB 12.4 11.8*  11.4* 9.3* 9.5*  HCT 39.5 37.5 35.3* 29.6* 29.6*  MCV 97.3 97.4 97.0 96.7 96.4  PLT 361 333 292 283 276    Basic Metabolic Panel: Recent Labs  Lab 10/24/20 1340 10/24/20 1725 10/25/20 0009 10/26/20 0030 10/27/20 0119  NA 139  --  138 135 133*  K 5.3*  --  4.8 4.8 5.1  CL 107  --  105 102 101  CO2 23  --  22 25 24   GLUCOSE 66*  --  265* 249* 424*  BUN 21*  --  22* 30* 31*  CREATININE 1.30* 1.26* 1.25* 1.37* 1.48*  CALCIUM 10.0  --  9.4 9.0 8.9  MG  --   --  2.2 2.1 2.0  PHOS  --   --  3.3 3.9 3.6    GFR: Estimated Creatinine Clearance: 30 mL/min (A) (by C-G formula based on SCr of 1.48 mg/dL (H)). Liver Function Tests: Recent Labs  Lab 10/24/20 1340 10/25/20 0009 10/26/20 0030 10/27/20 0119  AST 33 28 24 19   ALT 27 22 23 20   ALKPHOS 80 71 60 57  BILITOT 0.8 1.1 0.2* 0.4  PROT 8.2* 7.2 6.1* 6.2*  ALBUMIN 3.9 3.3* 2.8* 2.9*    No results for input(s): LIPASE, AMYLASE in the last 168 hours. No results for input(s): AMMONIA in the last 168 hours. Coagulation Profile: Recent Labs  Lab 10/24/20 1340  INR 0.9    Cardiac Enzymes: No results for input(s): CKTOTAL, CKMB, CKMBINDEX, TROPONINI in the last 168 hours. BNP (last 3 results) No results for input(s): PROBNP in the last 8760 hours. HbA1C: Recent Labs    10/24/20 2251  HGBA1C 7.7*   CBG: Recent Labs  Lab 10/26/20 1141 10/26/20 1635 10/26/20 2136 10/27/20 0732 10/27/20 1135  GLUCAP 276* 288* 323* 399* 199*    Lipid Profile: No results for input(s): CHOL, HDL, LDLCALC, TRIG, CHOLHDL, LDLDIRECT in the last 72 hours. Thyroid Function Tests: No results for input(s): TSH, T4TOTAL, FREET4, T3FREE, THYROIDAB in the last 72 hours. Anemia Panel: No results for input(s): VITAMINB12, FOLATE, FERRITIN, TIBC, IRON, RETICCTPCT in the last 72 hours. Sepsis Labs: No results for input(s): PROCALCITON, LATICACIDVEN in the last 168 hours.  Recent Results (from the past 240 hour(s))  Resp Panel by RT-PCR (Flu  A&B, Covid) Nasopharyngeal Swab     Status: None   Collection Time: 10/24/20 12:48 PM   Specimen: Nasopharyngeal Swab; Nasopharyngeal(NP) swabs in vial transport medium  Result Value Ref Range Status   SARS Coronavirus 2 by RT PCR NEGATIVE NEGATIVE Final    Comment: (NOTE) SARS-CoV-2 target nucleic acids are NOT DETECTED.  The SARS-CoV-2 RNA is generally detectable in upper respiratory specimens during the acute phase  of infection. The lowest concentration of SARS-CoV-2 viral copies this assay can detect is 138 copies/mL. A negative result does not preclude SARS-Cov-2 infection and should not be used as the sole basis for treatment or other patient management decisions. A negative result may occur with  improper specimen collection/handling, submission of specimen other than nasopharyngeal swab, presence of viral mutation(s) within the areas targeted by this assay, and inadequate number of viral copies(<138 copies/mL). A negative result must be combined with clinical observations, patient history, and epidemiological information. The expected result is Negative.  Fact Sheet for Patients:  BloggerCourse.com  Fact Sheet for Healthcare Providers:  SeriousBroker.it  This test is no t yet approved or cleared by the [place] FDA and  has been authorized for detection and/or diagnosis of SARS-CoV-2 by FDA under an Emergency Use Authorization (EUA). This EUA will remain  in effect (meaning this test can be used) for the duration of the COVID-19 declaration under Section 564(b)(1) of the Act, 21 U.S.C.section 360bbb-3(b)(1), unless the authorization is terminated  or revoked sooner.       Influenza A by PCR NEGATIVE NEGATIVE Final   Influenza B by PCR NEGATIVE NEGATIVE Final    Comment: (NOTE) The Xpert Xpress SARS-CoV-2/FLU/RSV plus assay is intended as an aid in the diagnosis of influenza from Nasopharyngeal swab specimens  and should not be used as a sole basis for treatment. Nasal washings and aspirates are unacceptable for Xpert Xpress SARS-CoV-2/FLU/RSV testing.  Fact Sheet for Patients: BloggerCourse.com  Fact Sheet for Healthcare Providers: SeriousBroker.it  This test is not yet approved or cleared by the [place] FDA and has been authorized for detection and/or diagnosis of SARS-CoV-2 by FDA under an Emergency Use Authorization (EUA). This EUA will remain in effect (meaning this test can be used) for the duration of the COVID-19 declaration under Section 564(b)(1) of the Act, 21 U.S.C. section 360bbb-3(b)(1), unless the authorization is terminated or revoked.  Performed at [facility], [address], [place], [place] [address]   Urine culture     Status: Abnormal   Collection Time: 10/24/20  1:00 PM   Specimen: Urine, Clean Catch  Result Value Ref Range Status   Specimen Description URINE, CLEAN CATCH  Final   Special Requests   Final    NONE Performed at [facility], [address] [place], [place] [address]    Culture >=100,000 COLONIES/mL PROTEUS MIRABILIS (A)  Final   Report Status 10/26/2020 FINAL  Final   Organism ID, Bacteria PROTEUS MIRABILIS (A)  Final      Susceptibility   Proteus mirabilis - MIC*    AMPICILLIN <=2 SENSITIVE Sensitive     CEFAZOLIN <=4 SENSITIVE Sensitive     CEFEPIME <=0.12 SENSITIVE Sensitive     CEFTRIAXONE <=0.25 SENSITIVE Sensitive     CIPROFLOXACIN <=0.25 SENSITIVE Sensitive     GENTAMICIN <=1 SENSITIVE Sensitive     IMIPENEM 2 SENSITIVE Sensitive     NITROFURANTOIN 128 RESISTANT Resistant     TRIMETH/SULFA <=20 SENSITIVE Sensitive     AMPICILLIN/SULBACTAM <=2 SENSITIVE Sensitive     PIP/TAZO <=4 SENSITIVE Sensitive     * >=100,000 COLONIES/mL PROTEUS MIRABILIS  Culture, blood (routine x 2)     Status: None (Preliminary result)   Collection Time: 10/24/20  9:00 PM   Specimen:  BLOOD  Result Value Ref Range Status   Specimen Description BLOOD RIGHT ARM  Final   Special Requests   Final    BOTTLES DRAWN  AEROBIC AND ANAEROBIC Blood Culture results may not be optimal due to an inadequate volume of blood received in culture bottles   Culture   Final    NO GROWTH 3 DAYS Performed at [facility], [address], [place], [place] [address]    Report Status PENDING  Incomplete  Culture, blood (routine x 2)     Status: None (Preliminary result)   Collection Time: 10/24/20  9:18 PM   Specimen: BLOOD  Result Value Ref Range Status   Specimen Description BLOOD LEFT HAND  Final   Special Requests   Final    BOTTLES DRAWN AEROBIC ONLY Blood Culture results may not be optimal due to an inadequate volume of blood received in culture bottles   Culture   Final    NO GROWTH 3 DAYS Performed at [facility], [address] [place], [place] [address]    Report Status PENDING  Incomplete          Radiology Studies: No results found.      Scheduled Meds:  aspirin EC  81 mg Oral Daily   dronabinol  5 mg Oral BID AC   enoxaparin (LOVENOX) injection  30 mg Subcutaneous Q24H   insulin aspart  0-5 Units Subcutaneous QHS   insulin aspart  0-6 Units Subcutaneous TID WC   insulin glargine  18 Units Subcutaneous QHS   Continuous Infusions:  sodium chloride Stopped (10/26/20 2200)   cefTRIAXone (ROCEPHIN)  IV Stopped (10/26/20 2100)     LOS: 1 day    Time spent:40 min    Byanca Kasper, Roselind Messier, MD [facility]   If 7PM-7AM, please contact night-coverage 10/27/2020, 4:16 PM

## 2020-10-27 NOTE — Progress Notes (Signed)
Inpatient Diabetes Program Recommendations  AACE/ADA: New Consensus Statement on Inpatient Glycemic Control (2015)  Target Ranges:  Prepandial:   less than 140 mg/dL      Peak postprandial:   less than 180 mg/dL (1-2 hours)      Critically ill patients:  140 - 180 mg/dL   Lab Results  Component Value Date   GLUCAP 399 (H) 10/27/2020   HGBA1C 7.3 (H) 03/12/2020    Review of Glycemic Control Results for Christine Benitez, Christine Benitez (MRN [id number]) as of 10/27/2020 10:20  Ref. Range 10/26/2020 11:41 10/26/2020 16:35 10/26/2020 21:36 10/27/2020 07:32  Glucose-Capillary Latest Ref Range: 70 - 99 mg/dL 060 (H) 045 (H) 997 (H) 399 (H)   Diabetes history: Type 2 DM Outpatient Diabetes medications: Metformin 500 mg BID, Humalog 5 units TID, Lantus 18 units QHS Current orders for Inpatient glycemic control: Novolog 0-6 units TID, Novolog 0-5 units QHS, Lantus 18 units QHS  Inpatient Diabetes Program Recommendations:    Consider adding Novolog 3 units TID (assuming patient consuming >50% of meals).   Thanks, Lujean Rave, MSN, RNC-OB Diabetes Coordinator [phone] (8a-5p)

## 2020-10-27 NOTE — Progress Notes (Signed)
The Pt. is sleeping at the time of the chaplain visit.   This chaplain spoke to the Pt. mother-[name] on the phone. The chaplain is present to update the Pt., her daughter-LaTori is making some progress today at the hospital.

## 2020-10-28 ENCOUNTER — Other Ambulatory Visit (HOSPITAL_COMMUNITY): Payer: Self-pay

## 2020-10-28 LAB — CBC WITH DIFFERENTIAL/PLATELET
Abs Immature Granulocytes: 0.02 10*3/uL (ref 0.00–0.07)
Basophils Absolute: 0 10*3/uL (ref 0.0–0.1)
Basophils Relative: 1 %
Eosinophils Absolute: 0.2 10*3/uL (ref 0.0–0.5)
Eosinophils Relative: 3 %
HCT: 31.1 % — ABNORMAL LOW (ref 36.0–46.0)
Hemoglobin: 10 g/dL — ABNORMAL LOW (ref 12.0–15.0)
Immature Granulocytes: 0 %
Lymphocytes Relative: 38 %
Lymphs Abs: 2.2 10*3/uL (ref 0.7–4.0)
MCH: 31 pg (ref 26.0–34.0)
MCHC: 32.2 g/dL (ref 30.0–36.0)
MCV: 96.3 fL (ref 80.0–100.0)
Monocytes Absolute: 0.5 10*3/uL (ref 0.1–1.0)
Monocytes Relative: 9 %
Neutro Abs: 2.9 10*3/uL (ref 1.7–7.7)
Neutrophils Relative %: 49 %
Platelets: 297 10*3/uL (ref 150–400)
RBC: 3.23 MIL/uL — ABNORMAL LOW (ref 3.87–5.11)
RDW: 12.6 % (ref 11.5–15.5)
WBC: 5.8 10*3/uL (ref 4.0–10.5)
nRBC: 0 % (ref 0.0–0.2)

## 2020-10-28 LAB — COMPREHENSIVE METABOLIC PANEL
ALT: 21 U/L (ref 0–44)
AST: 23 U/L (ref 15–41)
Albumin: 3 g/dL — ABNORMAL LOW (ref 3.5–5.0)
Alkaline Phosphatase: 56 U/L (ref 38–126)
Anion gap: 7 (ref 5–15)
BUN: 28 mg/dL — ABNORMAL HIGH (ref 6–20)
CO2: 25 mmol/L (ref 22–32)
Calcium: 9.5 mg/dL (ref 8.9–10.3)
Chloride: 104 mmol/L (ref 98–111)
Creatinine, Ser: 1.27 mg/dL — ABNORMAL HIGH (ref 0.44–1.00)
GFR, Estimated: 49 mL/min — ABNORMAL LOW (ref >60)
Glucose, Bld: 113 mg/dL — ABNORMAL HIGH (ref 70–99)
Potassium: 4.6 mmol/L (ref 3.5–5.1)
Sodium: 136 mmol/L (ref 135–145)
Total Bilirubin: 0.3 mg/dL (ref 0.3–1.2)
Total Protein: 6.2 g/dL — ABNORMAL LOW (ref 6.5–8.1)

## 2020-10-28 LAB — GLUCOSE, CAPILLARY
Glucose-Capillary: 117 mg/dL — ABNORMAL HIGH (ref 70–99)
Glucose-Capillary: 154 mg/dL — ABNORMAL HIGH (ref 70–99)
Glucose-Capillary: 158 mg/dL — ABNORMAL HIGH (ref 70–99)
Glucose-Capillary: 193 mg/dL — ABNORMAL HIGH (ref 70–99)
Glucose-Capillary: 34 mg/dL — CL (ref 70–99)
Glucose-Capillary: 91 mg/dL (ref 70–99)

## 2020-10-28 LAB — MAGNESIUM: Magnesium: 2 mg/dL (ref 1.7–2.4)

## 2020-10-28 LAB — PHOSPHORUS: Phosphorus: 4.1 mg/dL (ref 2.5–4.6)

## 2020-10-28 MED ORDER — CLOPIDOGREL BISULFATE 75 MG PO TABS
75.0000 mg | ORAL_TABLET | Freq: Every day | ORAL | 11 refills | Status: DC
  Filled 2020-10-28: qty 30, 30d supply, fill #0

## 2020-10-28 MED ORDER — CLOPIDOGREL BISULFATE 75 MG PO TABS
75.0000 mg | ORAL_TABLET | Freq: Every day | ORAL | 0 refills | Status: DC
  Filled 2020-10-28: qty 30, 30d supply, fill #0

## 2020-10-28 MED ORDER — DRONABINOL 5 MG PO CAPS: 5.0000 mg | ORAL_CAPSULE | Freq: Two times a day (BID) | ORAL | 0 refills | Status: DC

## 2020-10-28 MED ORDER — ALBUTEROL SULFATE HFA 108 (90 BASE) MCG/ACT IN AERS
2.0000 | INHALATION_SPRAY | Freq: Four times a day (QID) | RESPIRATORY_TRACT | 2 refills | Status: DC | PRN
  Filled 2020-10-28: qty 18, 30d supply, fill #0

## 2020-10-28 MED ORDER — INSULIN GLARGINE 100 UNIT/ML SOLOSTAR PEN
24.0000 [IU] | PEN_INJECTOR | Freq: Every day | SUBCUTANEOUS | 0 refills | Status: DC
  Filled 2020-10-28: qty 6, 25d supply, fill #0

## 2020-10-28 MED ORDER — NOVOLOG FLEXPEN 100 UNIT/ML ~~LOC~~ SOPN
10.0000 [IU] | PEN_INJECTOR | Freq: Three times a day (TID) | SUBCUTANEOUS | 0 refills | Status: DC
  Filled 2020-10-28: qty 15, 30d supply, fill #0

## 2020-10-28 MED ORDER — INSULIN PEN NEEDLE 32G X 4 MM MISC
0 refills | Status: DC
  Filled 2020-10-28: qty 200, 30d supply, fill #0

## 2020-10-28 NOTE — TOC Transition Note (Signed)
Transition of Care [facility]) - CM/SW Discharge Note   Patient Details  Name: Christine Benitez MRN: [id number] Date of Birth: 1960-12-17  Transition of Care [facility]) CM/SW Contact:  Mearl Latin, LCSW Phone Number: 10/28/2020, 4:40 PM   Clinical Narrative:    CSW received request to help patient get home as family stating they are unable to help. CSW and RN spoke with patient's mother, [name]. She stated she is unable to help patient physically but she and her son can go by and check on patient. She reported patient administers her own medications and per PT/OT patient is at baseline with no assistance needed with ADLs and use of a walker. Patient is without insurance and does not qualify for SNF. She has applied for Medicaid/Disability. Patient in agreement to discharge home if she has a ride. Patient's mother said she will call the hospital nursing station once patient's brother arrives back home with patient's key and then hospital can call for a taxi transport. Patient has her medications in hand and a follow up appointment has been scheduled.    Final next level of care: Home/Self Care Barriers to Discharge: No Barriers Identified   Patient Goals and CMS Choice        Discharge Placement                       Discharge Plan and Services   Discharge Planning Services: Indigent Health Clinic, Follow-up appt scheduled                                 Social Determinants of Health (SDOH) Interventions     Readmission Risk Interventions No flowsheet data found.

## 2020-10-28 NOTE — Progress Notes (Signed)
Left a voice mail on daughters phone which went straight to voicemail. Listed mother's phone number states not in service. Patient lives with her daughter who usually provides supervision and assistance with some ADLs. I am unable to send patient home despite a DC order d/t inability to reach any family members at  this time.

## 2020-10-28 NOTE — TOC Transition Note (Addendum)
Transition of Care [facility]) - CM/SW Discharge Note   Patient Details  Name: Christine Benitez MRN: [id number] Date of Birth: 03/18/1961  Transition of Care [facility]) CM/SW Contact:  Durenda Guthrie, RN Phone Number: 10/28/2020, 10:00 AM   Clinical Narrative:  Case manager has arranged hospital follow up and  establishment of care appointment for patient at [facility] for Friday, July 29,2022 at 1:30pm. Appointment added to AVS.  11:00am Referral made to [facility] for Palliative Care services. Spoke with [place] Robertson,RN.    Final next level of care: Home/Self Care Barriers to Discharge: No Barriers Identified   Patient Goals and CMS Choice        Discharge Placement                       Discharge Plan and Services   Discharge Planning Services: [facility]igent [facility] Clinic, Follow-up appt scheduled                                 Social Determinants of Health (SDOH) Interventions     Readmission Risk Interventions No flowsheet data found.

## 2020-10-28 NOTE — Progress Notes (Signed)
Educated patient on use of insulin pen using return demonstration

## 2020-10-28 NOTE — Progress Notes (Signed)
[facility] ([facility])  Hospital Liaison: RN note         This patient has been referred to our palliative care services in the community.  [facility] will continue to follow for any discharge planning needs and to coordinate continuation of palliative care in the outpatient setting.    If you have questions or need assistance, please call [phone] or contact the hospital Liaison listed on AMION.      Thank you for this referral.         Mary Anne Robertson, RN, CCM  [facility] Hospital Liaison   [phone] 

## 2020-10-28 NOTE — Discharge Summary (Signed)
Physician Discharge Summary  Christine Benitez RZN:[id number] DOB: Jan 07, 1961 DOA: 10/24/2020  PCP: Lupita Dawn, MD  Admit date: 10/24/2020 Discharge date: 10/28/2020  Time spent: 35 minutes  Recommendations for Outpatient Follow-up:    Covid vaccination; vaccinated 2/3       Acute lower UTI positive Proteus mirabilis -Urinalysis is dirty.  Continue empiric antibiotic urine cultures pending -6/19 complete 5-day course antibiotics   Failure to thrive -Patient admits that she is suffering from anorexia most likely secondary to her severe chronic systolic CHF. - Hospice care consult: Consider advanced care directives to not return to hospital secondary to weakness brought on by anorexia  vs hospice  Chronic systolic CHF/NICM/pulmonary HTN -Echocardiogram 03/12/2020 EF 20 to 25% - Older females with worsening cardiac function can present with fatigue, anorexia vs chest pain - Trend troponin - 6/18 Echocardiogram: Shows improved EF 50 to 55% see results below. - Even without worsening cardiac function anorexia/weight loss poor prognostic indicator. - Strict in and out +535.2 mL - Daily weight Filed Weights   10/27/20 0411 10/27/20 0605 10/28/20 0500  Weight: 46 kg 45.9 kg 43.7 kg  -Schedule follow-up appointment in 2 weeks with DO Kardie Tobb cardiologist chronic systolic CHF, and ICM, pulmonary HTN   Hx CVA -MRI negative for acute stroke.   DM type II with complication, with long-term use of insulin -03/12/2020 hemoglobin A1c= 7.3 - 6/20 increase Lantus 24 units daily -6/20 NovoLog 10 units qac    Cachectic (BMI 15.82 kg/m) - Poor prognostic factor into patient with severe heart failure. -Marinol 5 mg BID   Discharge Condition: Stable  Diet recommendation: Heart healthy/carb modified  Filed Weights   10/27/20 0411 10/27/20 0605 10/28/20 0500  Weight: 46 kg 45.9 kg 43.7 kg    History of present illness:  60 year old BF (from long-term care facility) PMHx HTN,, severe  chronic systolic CHF (EF 20 to 25%), NICM, pulmonary HTN, DM type II uncontrolled with hyperglycemia, thalamic stroke in November 2021, tobacco abuse, chronic cholecystitis with calculus,   Presents with weakness.  She states that she woke up this morning feeling weak all over.  She cannot say if it is 1 side or the other.  Her family had noticed some slurred speech and maybe some confusion.  Her last known normal was 7 PM yesterday.  On chart review, it was noted that she had had some residual weakness from her prior stroke and left side facial drooping although patient says that she normally does not have any weakness.  She denies any fevers.  No cough or cold symptoms.  No pain anywhere.  No abdominal pain.  No vomiting or diarrhea.   Patient states has not been eating, and she has been feeling fatigued, weak.  Brother called 911 because patient was unable to eat/move around.  Hospital Course:  See above  Procedures:  6/17 MRI brain W0 contrast:No evidence of acute intracranial abnormality. -Redemonstrated small chronic cortical/subcortical infarct within left parietal lobe, chronic small-vessel infarct within the right parietal lobe and chronic infarct within the ventromedial right thalamus/right midbrain.   Stable background mild-to-moderate generalized parenchymal atrophy and moderate cerebral white matter chronic small vessel ischemic disease. Mild chronic small vessel ischemic changes within the pons are also stable.   The axial SWI sequence is motion degraded. Small foci of chronic hemorrhage are questioned within the bilateral parietal lobes (versus artifact). 6/18 Echocardiogram; Left Ventricle: LVEF= 50 to 55%. The left ventricle has low normal function. The left ventricle has  no regional  wall motion abnormalities. The left ventricular internal  cavity size was normal in size.  There is mild left ventricular hypertrophy. Left ventricular diastolic  parameters are consistent  with Grade I diastolic dysfunction (impaired  relaxation). Indeterminate filling pressures.   Right Ventricle: The right ventricular size is normal. No increase in  right ventricular wall thickness. Right ventricular systolic function is  mildly reduced. Tricuspid regurgitation signal is inadequate for assessing  PA pressure.  Consultations: Palliative care   Cultures  6/17 urine positive Proteus mirabilis 6/17 blood pending 6/17 SARS coronavirus negative 6/17 influenza A/B negative   Antibiotics Anti-infectives (From admission, onward)    Start     Ordered Stop   10/25/20 1700  cefTRIAXone (ROCEPHIN) 1 g in sodium chloride 0.9 % 100 mL IVPB  Status:  Discontinued        10/24/20 1929 10/28/20 1740   10/24/20 1630  cefTRIAXone (ROCEPHIN) 1 g in sodium chloride 0.9 % 100 mL IVPB        10/24/20 1623 10/24/20 1934         Discharge Exam: Vitals:   10/28/20 0500 10/28/20 0736 10/28/20 1153 10/28/20 1718  BP:  123/76 105/63 (!) 99/38  Pulse:  90 79 99  Resp:  16 16 17   Temp:  97.8 F (36.6 C) 97.8 F (36.6 C) 97.7 F (36.5 C)  TempSrc:  Oral Oral Oral  SpO2:  98%    Weight: 43.7 kg     Height:        General: A/O x4, No acute respiratory distress, cachectic Eyes: negative scleral hemorrhage, negative anisocoria, negative icterus ENT: Negative Runny nose, negative gingival bleeding, Positive hard of hearing Neck:  Negative scars, masses, torticollis, lymphadenopathy, JVD Lungs: Clear to auscultation bilaterally without wheezes or crackles Cardiovascular: Regular rate and rhythm without murmur gallop or rub normal S1 and S2 Abdomen: negative abdominal pain, nondistended, positive soft, bowel sounds, no rebound, no ascites, no appreciable mass    Discharge Instructions   Allergies as of 10/28/2020   No Known Allergies      Medication List     STOP taking these medications    insulin glargine 100 UNIT/ML injection Commonly known as: LANTUS Replaced by:  Lantus SoloStar 100 UNIT/ML Solostar Pen   insulin lispro 100 UNIT/ML KwikPen Commonly known as: HUMALOG   metFORMIN 500 MG tablet Commonly known as: GLUCOPHAGE   metoprolol succinate 25 MG 24 hr tablet Commonly known as: Toprol XL   ondansetron 4 MG tablet Commonly known as: ZOFRAN   sodium zirconium cyclosilicate 10 g Pack packet Commonly known as: LOKELMA       TAKE these medications    albuterol 108 (90 Base) MCG/ACT inhaler Commonly known as: VENTOLIN HFA Inhale 2 puffs into the lungs every 6 (six) hours as needed for wheezing or shortness of breath.   aspirin 81 MG EC tablet TAKE 1 TABLET (81 MG TOTAL) BY MOUTH DAILY.   atorvastatin 40 MG tablet Commonly known as: LIPITOR Take 1 tablet (40 mg total) by mouth every evening.   clopidogrel 75 MG tablet Commonly known as: Plavix Take 1 tablet (75 mg total) by mouth daily. What changed: Another medication with the same name was added. Make sure you understand how and when to take each.   clopidogrel 75 MG tablet Commonly known as: Plavix Take 1 tablet (75 mg total) by mouth daily. What changed: You were already taking a medication with the same name, and this prescription was added. Make sure you understand  how and when to take each.   dronabinol 5 MG capsule Commonly known as: MARINOL Take 1 capsule (5 mg total) by mouth 2 (two) times daily before lunch and supper.   ivabradine 5 MG Tabs tablet Commonly known as: Corlanor Take 0.5 tablets (2.5 mg total) by mouth 2 (two) times daily with a meal.   Lantus SoloStar 100 UNIT/ML Solostar Pen Generic drug: insulin glargine Inject 24 Units into the skin daily. Replaces: insulin glargine 100 UNIT/ML injection   NovoLOG FlexPen 100 UNIT/ML FlexPen Generic drug: insulin aspart Inject 10 Units into the skin 3 (three) times daily with meals.   Pentips 32G X 4 MM Misc Generic drug: Insulin Pen Needle use as directed   traMADol 50 MG tablet Commonly known as:  ULTRAM Take 50 mg by mouth every 6 (six) hours as needed.       ASK your doctor about these medications    mirtazapine 15 MG disintegrating tablet Commonly known as: REMERON SOL-TAB Take 0.5 tablets (7.5 mg total) by mouth at bedtime.       No Known Allergies  Follow-up Information     [facility]. Go to.   Why: Hospital follow-up/ establish PCP - your appointment is for Friday, July 29,2022 at 1:30pm. If you need to change appt please call [phone] If you want to make this a VIRTUAL appt please call [phone] Contact information: [address] [place] [address]                 The results of significant diagnostics from this hospitalization (including imaging, microbiology, ancillary and laboratory) are listed below for reference.    Significant Diagnostic Studies: DG Chest 1 View  Result Date: 10/24/2020 CLINICAL DATA:  Confused.  Clearance for MRI. EXAM: CHEST  1 VIEW COMPARISON:  One-view chest x-ray 03/14/2020 FINDINGS: The heart size and mediastinal contours are within normal limits. Both lungs are clear. The visualized skeletal structures are unremarkable. IMPRESSION: No radiopaque foreign body. No acute cardiopulmonary disease. Electronically Signed   By: Marin Roberts M.D.   On: 10/24/2020 15:56   DG Abdomen 1 View  Result Date: 10/24/2020 CLINICAL DATA:  Clearance for MRI. EXAM: ABDOMEN - 1 VIEW COMPARISON:  None. FINDINGS: The bowel gas pattern is normal. No radio-opaque calculi or other significant radiographic abnormality are seen. IMPRESSION: Negative one view abdomen. No radiopaque foreign body to contraindicate MRI. Electronically Signed   By: Marin Roberts M.D.   On: 10/24/2020 15:57   CT HEAD WO CONTRAST  Result Date: 10/24/2020 CLINICAL DATA:  Neuro deficit, acute, stroke suspected. Additional history provided: Generalized weakness today, inability to stand. History of prior  stroke. EXAM: CT HEAD WITHOUT CONTRAST TECHNIQUE: Contiguous axial images were obtained from the base of the skull through the vertex without intravenous contrast. COMPARISON:  CT angiogram head 03/12/2020.  Brain MRI 03/11/2020. FINDINGS: Brain: Stable cerebral and cerebellar atrophy. Redemonstrated small chronic cortical/subcortical infarct within the left parietal lobe. Redemonstrated remote lacunar infarct within the right parietal white matter (series 3, image 18). Mild-to-moderate ill-defined hypoattenuation within the cerebral white matter is nonspecific, but compatible with chronic small vessel ischemic disease. Known chronic infarct within the right thalamus and midbrain, better appreciated on the brain MRI of 03/11/2020 (acute at that time). There is no acute intracranial hemorrhage. No acute demarcated cortical infarct. No extra-axial fluid collection. No evidence of intracranial mass. No midline shift. Vascular: No hyperdense vessel.  Atherosclerotic calcifications. Skull: Normal. Negative for fracture or  focal lesion. Sinuses/Orbits: Visualized orbits show no acute finding. Opacification of the left ethmoid air cell. Background trace bilateral ethmoid sinus mucosal thickening. IMPRESSION: No evidence of acute intracranial abnormality. Redemonstrated chronic infarcts within the cortical/subcortical left parietal lobe, right parietal white matter and right thalamus/midbrain. Stable background mild-to-moderate cerebral white matter chronic small vessel ischemic disease. Stable cerebral and cerebellar atrophy. Mild paranasal sinus disease at the imaged levels, as described. Electronically Signed   By: Jackey Loge DO   On: 10/24/2020 13:55   MR BRAIN WO CONTRAST  Result Date: 10/24/2020 CLINICAL DATA:  Neuro deficit, acute, stroke suspected. EXAM: MRI HEAD WITHOUT CONTRAST TECHNIQUE: Multiplanar, multiecho pulse sequences of the brain and surrounding structures were obtained without intravenous  contrast. COMPARISON:  Prior head CT examinations 10/24/2020 and earlier. Brain MRI 03/11/2020. FINDINGS: Brain: Mild intermittent motion degradation. Mild-to-moderate cerebral and cerebellar atrophy. Redemonstrated chronic infarct within the ventromedial right thalamus and right midbrain. There is a small amount of residual restricted diffusion along the periphery of the thalamic component of this infarct. Small chronic cortical/subcortical infarct within the left parietal lobe. Chronic small-vessel infarct within the right parietal white matter. Background moderate multifocal T2/FLAIR hyperintensity within the cerebral white matter, nonspecific but compatible with chronic small vessel ischemic disease. To a lesser degree, mild chronic small vessel ischemic changes are also present within the pons. The axial SWI sequence is motion degraded. Small foci of chronic hemosiderin deposition are questioned within the bilateral parietal lobes. There is no acute infarct. No evidence of intracranial mass. No extra-axial fluid collection. No midline shift. Vascular: Expected proximal arterial flow voids. Skull and upper cervical spine: No focal marrow lesion. Sinuses/Orbits: Visualized orbits show no acute finding. Opacification of a left ethmoid air cell. IMPRESSION: No evidence of acute intracranial abnormality. Redemonstrated small chronic cortical/subcortical infarct within the left parietal lobe, chronic small-vessel infarct within the right parietal lobe and chronic infarct within the ventromedial right thalamus/right midbrain. Stable background mild-to-moderate generalized parenchymal atrophy and moderate cerebral white matter chronic small vessel ischemic disease. Mild chronic small vessel ischemic changes within the pons are also stable. The axial SWI sequence is motion degraded. Small foci of chronic hemorrhage are questioned within the bilateral parietal lobes (versus artifact). Electronically Signed   By: Jackey Loge DO   On: 10/24/2020 18:34   ECHOCARDIOGRAM COMPLETE  Result Date: 10/25/2020    ECHOCARDIOGRAM REPORT   Patient Name:   LAVENDER STANKE Date of Exam: 10/25/2020 Medical Rec #:  [id number]       Height:       66.0 in Accession #:    [id number]      Weight:       98.0 lb Date of Birth:  1960-12-20       BSA:          1.477 m Patient Age:    59 years        BP:           90/58 mmHg Patient Gender: F               HR:           86 bpm. Exam Location:  Inpatient Procedure: 2D Echo, Color Doppler and Cardiac Doppler Indications:    CHF-Acute Systolic I50.21  History:        Patient has prior history of Echocardiogram examinations, most                 recent 03/12/2020. CHF and Cardiomyopathy, Pulmonary  HTN; Risk                 Factors:Hypertension, Diabetes and Current Smoker.  Sonographer:    Eulah Pont RDCS Referring Phys: [id number] Hallee Mckenny J Kedron Uno IMPRESSIONS  1. Left ventricular ejection fraction, by estimation, is 50 to 55%. The left ventricle has low normal function. The left ventricle has no regional wall motion abnormalities. There is mild left ventricular hypertrophy. Left ventricular diastolic parameters are consistent with Grade I diastolic dysfunction (impaired relaxation).  2. Right ventricular systolic function is mildly reduced. The right ventricular size is normal. Tricuspid regurgitation signal is inadequate for assessing PA pressure.  3. Left atrial size was mildly dilated.  4. The mitral valve is grossly normal. Trivial mitral valve regurgitation.  5. The aortic valve is tricuspid. Aortic valve regurgitation is not visualized.  6. The inferior vena cava is normal in size with greater than 50% respiratory variability, suggesting right atrial pressure of 3 mmHg. Comparison(s): Changes from prior study are noted. 03/12/2020: LVEF 20-25%. FINDINGS  Left Ventricle: Left ventricular ejection fraction, by estimation, is 50 to 55%. The left ventricle has low normal function. The left ventricle has no  regional wall motion abnormalities. The left ventricular internal cavity size was normal in size. There is mild left ventricular hypertrophy. Left ventricular diastolic parameters are consistent with Grade I diastolic dysfunction (impaired relaxation). Indeterminate filling pressures. Right Ventricle: The right ventricular size is normal. No increase in right ventricular wall thickness. Right ventricular systolic function is mildly reduced. Tricuspid regurgitation signal is inadequate for assessing PA pressure. Left Atrium: Left atrial size was mildly dilated. Right Atrium: Right atrial size was normal in size. Pericardium: There is no evidence of pericardial effusion. Mitral Valve: The mitral valve is grossly normal. Trivial mitral valve regurgitation. Tricuspid Valve: The tricuspid valve is grossly normal. Tricuspid valve regurgitation is trivial. Aortic Valve: The aortic valve is tricuspid. Aortic valve regurgitation is not visualized. Pulmonic Valve: The pulmonic valve was grossly normal. Pulmonic valve regurgitation is not visualized. Aorta: The aortic root and ascending aorta are structurally normal, with no evidence of dilitation. Venous: The inferior vena cava is normal in size with greater than 50% respiratory variability, suggesting right atrial pressure of 3 mmHg. IAS/Shunts: No atrial level shunt detected by color flow Doppler.  LEFT VENTRICLE PLAX 2D LVIDd:         3.10 cm  Diastology LVIDs:         2.30 cm  LV e' medial:    4.39 cm/s LV PW:         1.10 cm  LV E/e' medial:  9.1 LV IVS:        0.90 cm  LV e' lateral:   4.67 cm/s LVOT diam:     2.00 cm  LV E/e' lateral: 8.5 LV SV:         45 LV SV Index:   31 LVOT Area:     3.14 cm  RIGHT VENTRICLE RV S prime:     8.16 cm/s TAPSE (M-mode): 1.8 cm LEFT ATRIUM             Index       RIGHT ATRIUM          Index LA diam:        2.50 cm 1.69 cm/m  RA Area:     9.82 cm LA Vol (A2C):   24.3 ml 16.45 ml/m RA Volume:   21.80 ml 14.76 ml/m LA Vol (A4C):  23.6 ml 15.98 ml/m LA Biplane Vol: 25.7 ml 17.40 ml/m  AORTIC VALVE LVOT Vmax:   94.30 cm/s LVOT Vmean:  59.300 cm/s LVOT VTI:    0.144 m  AORTA Ao Root diam: 2.60 cm Ao Asc diam:  2.70 cm MITRAL VALVE MV Area (PHT): 2.13 cm    SHUNTS MV Decel Time: 356 msec    Systemic VTI:  0.14 m MV E velocity: 39.80 cm/s  Systemic Diam: 2.00 cm MV A velocity: 78.20 cm/s MV E/A ratio:  0.51 Zoila Shutter MD Electronically signed by Zoila Shutter MD Signature Date/Time: 10/25/2020/3:51:47 PM    Final     Microbiology: Recent Results (from the past 240 hour(s))  Resp Panel by RT-PCR (Flu A&B, Covid) Nasopharyngeal Swab     Status: None   Collection Time: 10/24/20 12:48 PM   Specimen: Nasopharyngeal Swab; Nasopharyngeal(NP) swabs in vial transport medium  Result Value Ref Range Status   SARS Coronavirus 2 by RT PCR NEGATIVE NEGATIVE Final    Comment: (NOTE) SARS-CoV-2 target nucleic acids are NOT DETECTED.  The SARS-CoV-2 RNA is generally detectable in upper respiratory specimens during the acute phase of infection. The lowest concentration of SARS-CoV-2 viral copies this assay can detect is 138 copies/mL. A negative result does not preclude SARS-Cov-2 infection and should not be used as the sole basis for treatment or other patient management decisions. A negative result may occur with  improper specimen collection/handling, submission of specimen other than nasopharyngeal swab, presence of viral mutation(s) within the areas targeted by this assay, and inadequate number of viral copies(<138 copies/mL). A negative result must be combined with clinical observations, patient history, and epidemiological information. The expected result is Negative.  Fact Sheet for Patients:  BloggerCourse.com  Fact Sheet for Healthcare Providers:  SeriousBroker.it  This test is no t yet approved or cleared by the [place] FDA and  has been authorized for detection  and/or diagnosis of SARS-CoV-2 by FDA under an Emergency Use Authorization (EUA). This EUA will remain  in effect (meaning this test can be used) for the duration of the COVID-19 declaration under Section 564(b)(1) of the Act, 21 U.S.C.section 360bbb-3(b)(1), unless the authorization is terminated  or revoked sooner.       Influenza A by PCR NEGATIVE NEGATIVE Final   Influenza B by PCR NEGATIVE NEGATIVE Final    Comment: (NOTE) The Xpert Xpress SARS-CoV-2/FLU/RSV plus assay is intended as an aid in the diagnosis of influenza from Nasopharyngeal swab specimens and should not be used as a sole basis for treatment. Nasal washings and aspirates are unacceptable for Xpert Xpress SARS-CoV-2/FLU/RSV testing.  Fact Sheet for Patients: BloggerCourse.com  Fact Sheet for Healthcare Providers: SeriousBroker.it  This test is not yet approved or cleared by the [place] FDA and has been authorized for detection and/or diagnosis of SARS-CoV-2 by FDA under an Emergency Use Authorization (EUA). This EUA will remain in effect (meaning this test can be used) for the duration of the COVID-19 declaration under Section 564(b)(1) of the Act, 21 U.S.C. section 360bbb-3(b)(1), unless the authorization is terminated or revoked.  Performed at [facility], [address] [place], [place] [address]   Urine culture     Status: Abnormal   Collection Time: 10/24/20  1:00 PM   Specimen: Urine, Clean Catch  Result Value Ref Range Status   Specimen Description URINE, CLEAN CATCH  Final   Special Requests   Final    NONE Performed at [facility], [address] [place], [place] [address]    Culture >=100,000 COLONIES/mL PROTEUS MIRABILIS (A)  Final   Report Status 10/26/2020 FINAL  Final   Organism ID, Bacteria PROTEUS MIRABILIS (A)  Final      Susceptibility   Proteus mirabilis - MIC*    AMPICILLIN <=2 SENSITIVE Sensitive      CEFAZOLIN <=4 SENSITIVE Sensitive     CEFEPIME <=0.12 SENSITIVE Sensitive     CEFTRIAXONE <=0.25 SENSITIVE Sensitive     CIPROFLOXACIN <=0.25 SENSITIVE Sensitive     GENTAMICIN <=1 SENSITIVE Sensitive     IMIPENEM 2 SENSITIVE Sensitive     NITROFURANTOIN 128 RESISTANT Resistant     TRIMETH/SULFA <=20 SENSITIVE Sensitive     AMPICILLIN/SULBACTAM <=2 SENSITIVE Sensitive     PIP/TAZO <=4 SENSITIVE Sensitive     * >=100,000 COLONIES/mL PROTEUS MIRABILIS  Culture, blood (routine x 2)     Status: None (Preliminary result)   Collection Time: 10/24/20  9:00 PM   Specimen: BLOOD  Result Value Ref Range Status   Specimen Description BLOOD RIGHT ARM  Final   Special Requests   Final    BOTTLES DRAWN AEROBIC AND ANAEROBIC Blood Culture results may not be optimal due to an inadequate volume of blood received in culture bottles   Culture   Final    NO GROWTH 4 DAYS Performed at [facility], [address], [place], [place] [address]    Report Status PENDING  Incomplete  Culture, blood (routine x 2)     Status: None (Preliminary result)   Collection Time: 10/24/20  9:18 PM   Specimen: BLOOD  Result Value Ref Range Status   Specimen Description BLOOD LEFT HAND  Final   Special Requests   Final    BOTTLES DRAWN AEROBIC ONLY Blood Culture results may not be optimal due to an inadequate volume of blood received in culture bottles   Culture   Final    NO GROWTH 4 DAYS Performed at [facility], [address], [place], [place] [address]    Report Status PENDING  Incomplete     Labs: Basic Metabolic Panel: Recent Labs  Lab 10/24/20 1340 10/24/20 1725 10/25/20 0009 10/26/20 0030 10/27/20 0119 10/28/20 0153  NA 139  --  138 135 133* 136  K 5.3*  --  4.8 4.8 5.1 4.6  CL 107  --  105 102 101 104  CO2 23  --  22 25 24 25   GLUCOSE 66*  --  265* 249* 424* 113*  BUN 21*  --  22* 30* 31* 28*  CREATININE 1.30* 1.26* 1.25* 1.37* 1.48* 1.27*  CALCIUM 10.0  --  9.4 9.0 8.9 9.5  MG   --   --  2.2 2.1 2.0 2.0  PHOS  --   --  3.3 3.9 3.6 4.1   Liver Function Tests: Recent Labs  Lab 10/24/20 1340 10/25/20 0009 10/26/20 0030 10/27/20 0119 10/28/20 0153  AST 33 28 24 19 23   ALT 27 22 23 20 21   ALKPHOS 80 71 60 57 56  BILITOT 0.8 1.1 0.2* 0.4 0.3  PROT 8.2* 7.2 6.1* 6.2* 6.2*  ALBUMIN 3.9 3.3* 2.8* 2.9* 3.0*   No results for input(s): LIPASE, AMYLASE in the last 168 hours. No results for input(s): AMMONIA in the last 168 hours. CBC: Recent Labs  Lab 10/24/20 1340 10/24/20 1725 10/25/20 0009 10/26/20 0030 10/27/20 0119 10/28/20 0153  WBC 11.4* 9.4 7.9 6.3 5.7 5.8  NEUTROABS 8.4*  --  5.0 3.2 3.0 2.9  HGB 12.4 11.8* 11.4* 9.3* 9.5* 10.0*  HCT 39.5 37.5 35.3* 29.6* 29.6* 31.1*  MCV 97.3 97.4 97.0 96.7 96.4 96.3  PLT 361 333 292 283 276 297   Cardiac Enzymes: No results for input(s): CKTOTAL, CKMB, CKMBINDEX, TROPONINI in the last 168 hours. BNP: BNP (last 3 results) No results for input(s): BNP in the last 8760 hours.  ProBNP (last 3 results) No results for input(s): PROBNP in the last 8760 hours.  CBG: Recent Labs  Lab 10/28/20 0345 10/28/20 0734 10/28/20 1225 10/28/20 1317 10/28/20 1717  GLUCAP 154* 158* 34* 91 193*       Signed:  Carolyne Littlesurtis Rilya Longo, MD [facility]

## 2020-10-28 NOTE — Progress Notes (Signed)
Patient escorted via wc to [employer]. Estimated Voucher amount changed to $60 from $41 per cab personnel when call was made. Consulting civil engineer notified as well

## 2020-10-28 NOTE — Progress Notes (Signed)
Hypoglycemic Event  CBG: 34  Treatment: 8 oz juice/soda, with her lunch.   Symptoms: None  Follow-up CBG: Time:1317 CBG Result:91  Possible Reasons for Event: Medication regimen: Scheduled 10 units, and sliding scale this AM  Comments/MD notified:MD Dc'd sliding scale.     Loveda Colaizzi J Lanisha Stepanian

## 2020-10-29 LAB — CULTURE, BLOOD (ROUTINE X 2)
Culture: NO GROWTH
Culture: NO GROWTH

## 2020-11-02 ENCOUNTER — Inpatient Hospital Stay (HOSPITAL_COMMUNITY)
Admission: EM | Admit: 2020-11-02 | Discharge: 2020-11-18 | DRG: 637 | Disposition: A | Discharge status: Home Health | Payer: Medicaid Other | Attending: Internal Medicine | Admitting: Internal Medicine

## 2020-11-02 ENCOUNTER — Other Ambulatory Visit: Payer: Self-pay

## 2020-11-02 DIAGNOSIS — E162 Hypoglycemia, unspecified: Secondary | ICD-10-CM | POA: Diagnosis present

## 2020-11-02 DIAGNOSIS — E869 Volume depletion, unspecified: Secondary | ICD-10-CM | POA: Diagnosis present

## 2020-11-02 DIAGNOSIS — D631 Anemia in chronic kidney disease: Secondary | ICD-10-CM | POA: Diagnosis present

## 2020-11-02 DIAGNOSIS — E1122 Type 2 diabetes mellitus with diabetic chronic kidney disease: Secondary | ICD-10-CM | POA: Diagnosis present

## 2020-11-02 DIAGNOSIS — E871 Hypo-osmolality and hyponatremia: Secondary | ICD-10-CM | POA: Diagnosis present

## 2020-11-02 DIAGNOSIS — Z7982 Long term (current) use of aspirin: Secondary | ICD-10-CM

## 2020-11-02 DIAGNOSIS — I130 Hypertensive heart and chronic kidney disease with heart failure and stage 1 through stage 4 chronic kidney disease, or unspecified chronic kidney disease: Secondary | ICD-10-CM | POA: Diagnosis present

## 2020-11-02 DIAGNOSIS — Z794 Long term (current) use of insulin: Secondary | ICD-10-CM

## 2020-11-02 DIAGNOSIS — Z87440 Personal history of urinary (tract) infections: Secondary | ICD-10-CM

## 2020-11-02 DIAGNOSIS — E440 Moderate protein-calorie malnutrition: Secondary | ICD-10-CM | POA: Diagnosis present

## 2020-11-02 DIAGNOSIS — I272 Pulmonary hypertension, unspecified: Secondary | ICD-10-CM | POA: Diagnosis present

## 2020-11-02 DIAGNOSIS — Z87891 Personal history of nicotine dependence: Secondary | ICD-10-CM

## 2020-11-02 DIAGNOSIS — E1165 Type 2 diabetes mellitus with hyperglycemia: Secondary | ICD-10-CM | POA: Diagnosis not present

## 2020-11-02 DIAGNOSIS — R627 Adult failure to thrive: Secondary | ICD-10-CM | POA: Diagnosis present

## 2020-11-02 DIAGNOSIS — N1831 Chronic kidney disease, stage 3a: Secondary | ICD-10-CM | POA: Diagnosis present

## 2020-11-02 DIAGNOSIS — Z79899 Other long term (current) drug therapy: Secondary | ICD-10-CM

## 2020-11-02 DIAGNOSIS — H9190 Unspecified hearing loss, unspecified ear: Secondary | ICD-10-CM | POA: Diagnosis present

## 2020-11-02 DIAGNOSIS — Z8249 Family history of ischemic heart disease and other diseases of the circulatory system: Secondary | ICD-10-CM

## 2020-11-02 DIAGNOSIS — Z833 Family history of diabetes mellitus: Secondary | ICD-10-CM

## 2020-11-02 DIAGNOSIS — E118 Type 2 diabetes mellitus with unspecified complications: Secondary | ICD-10-CM

## 2020-11-02 DIAGNOSIS — I313 Pericardial effusion (noninflammatory): Secondary | ICD-10-CM | POA: Diagnosis present

## 2020-11-02 DIAGNOSIS — Z20822 Contact with and (suspected) exposure to covid-19: Secondary | ICD-10-CM | POA: Diagnosis present

## 2020-11-02 DIAGNOSIS — K5900 Constipation, unspecified: Secondary | ICD-10-CM | POA: Diagnosis present

## 2020-11-02 DIAGNOSIS — I428 Other cardiomyopathies: Secondary | ICD-10-CM | POA: Diagnosis present

## 2020-11-02 DIAGNOSIS — I69354 Hemiplegia and hemiparesis following cerebral infarction affecting left non-dominant side: Secondary | ICD-10-CM

## 2020-11-02 DIAGNOSIS — I959 Hypotension, unspecified: Secondary | ICD-10-CM | POA: Diagnosis present

## 2020-11-02 DIAGNOSIS — E11649 Type 2 diabetes mellitus with hypoglycemia without coma: Principal | ICD-10-CM | POA: Diagnosis present

## 2020-11-02 DIAGNOSIS — Z823 Family history of stroke: Secondary | ICD-10-CM

## 2020-11-02 DIAGNOSIS — G9341 Metabolic encephalopathy: Secondary | ICD-10-CM | POA: Diagnosis present

## 2020-11-02 DIAGNOSIS — Z7902 Long term (current) use of antithrombotics/antiplatelets: Secondary | ICD-10-CM

## 2020-11-02 DIAGNOSIS — E875 Hyperkalemia: Secondary | ICD-10-CM | POA: Diagnosis present

## 2020-11-02 DIAGNOSIS — Z66 Do not resuscitate: Secondary | ICD-10-CM | POA: Diagnosis present

## 2020-11-02 DIAGNOSIS — R64 Cachexia: Secondary | ICD-10-CM | POA: Diagnosis present

## 2020-11-02 DIAGNOSIS — I69319 Unspecified symptoms and signs involving cognitive functions following cerebral infarction: Secondary | ICD-10-CM

## 2020-11-02 DIAGNOSIS — Z681 Body mass index (BMI) 19 or less, adult: Secondary | ICD-10-CM

## 2020-11-02 DIAGNOSIS — I5022 Chronic systolic (congestive) heart failure: Secondary | ICD-10-CM | POA: Diagnosis present

## 2020-11-02 LAB — BASIC METABOLIC PANEL
Anion gap: 7 (ref 5–15)
BUN: 23 mg/dL — ABNORMAL HIGH (ref 6–20)
CO2: 24 mmol/L (ref 22–32)
Calcium: 9.3 mg/dL (ref 8.9–10.3)
Chloride: 107 mmol/L (ref 98–111)
Creatinine, Ser: 1.25 mg/dL — ABNORMAL HIGH (ref 0.44–1.00)
GFR, Estimated: 50 mL/min — ABNORMAL LOW (ref >60)
Glucose, Bld: 86 mg/dL (ref 70–99)
Potassium: 4.9 mmol/L (ref 3.5–5.1)
Sodium: 138 mmol/L (ref 135–145)

## 2020-11-02 LAB — CBG MONITORING, ED
Glucose-Capillary: 60 mg/dL — ABNORMAL LOW (ref 70–99)
Glucose-Capillary: 16 mg/dL — CL (ref 70–99)
Glucose-Capillary: 64 mg/dL — ABNORMAL LOW (ref 70–99)
Glucose-Capillary: 77 mg/dL (ref 70–99)
Glucose-Capillary: 100 mg/dL — ABNORMAL HIGH (ref 70–99)
Glucose-Capillary: 95 mg/dL (ref 70–99)

## 2020-11-02 LAB — I-STAT CHEM 8, ED
BUN: 38 mg/dL — ABNORMAL HIGH (ref 6–20)
Calcium, Ion: 1.24 mmol/L (ref 1.15–1.40)
Chloride: 109 mmol/L (ref 98–111)
Creatinine, Ser: 1.3 mg/dL — ABNORMAL HIGH (ref 0.44–1.00)
Glucose, Bld: 24 mg/dL — CL (ref 70–99)
HCT: 29 % — ABNORMAL LOW (ref 36.0–46.0)
Hemoglobin: 9.9 g/dL — ABNORMAL LOW (ref 12.0–15.0)
Potassium: 8.2 mmol/L (ref 3.5–5.1)
Sodium: 137 mmol/L (ref 135–145)
TCO2: 26 mmol/L (ref 22–32)

## 2020-11-02 LAB — CBC WITH DIFFERENTIAL/PLATELET
Abs Immature Granulocytes: 0.01 10*3/uL (ref 0.00–0.07)
Basophils Absolute: 0 10*3/uL (ref 0.0–0.1)
Basophils Relative: 1 %
Eosinophils Absolute: 0.1 10*3/uL (ref 0.0–0.5)
Eosinophils Relative: 2 %
HCT: 31.5 % — ABNORMAL LOW (ref 36.0–46.0)
Hemoglobin: 9.7 g/dL — ABNORMAL LOW (ref 12.0–15.0)
Immature Granulocytes: 0 %
Lymphocytes Relative: 29 %
Lymphs Abs: 1.7 10*3/uL (ref 0.7–4.0)
MCH: 31.5 pg (ref 26.0–34.0)
MCHC: 30.8 g/dL (ref 30.0–36.0)
MCV: 102.3 fL — ABNORMAL HIGH (ref 80.0–100.0)
Monocytes Absolute: 0.6 10*3/uL (ref 0.1–1.0)
Monocytes Relative: 10 %
Neutro Abs: 3.6 10*3/uL (ref 1.7–7.7)
Neutrophils Relative %: 58 %
Platelets: 243 10*3/uL (ref 150–400)
RBC: 3.08 MIL/uL — ABNORMAL LOW (ref 3.87–5.11)
RDW: 13.9 % (ref 11.5–15.5)
WBC: 6.1 10*3/uL (ref 4.0–10.5)
nRBC: 0 % (ref 0.0–0.2)

## 2020-11-02 LAB — GLUCOSE, CAPILLARY: Glucose-Capillary: 167 mg/dL — ABNORMAL HIGH (ref 70–99)

## 2020-11-02 MED ORDER — DEXTROSE 50 % IV SOLN
INTRAVENOUS | Status: AC
  Administered 2020-11-02: 50 mL via INTRAVENOUS
  Filled 2020-11-02: qty 50

## 2020-11-02 MED ORDER — DEXTROSE 10 % IV SOLN: INTRAVENOUS | Status: DC

## 2020-11-02 MED ORDER — DEXTROSE 50 % IV SOLN: 1.0000 | Freq: Once | INTRAVENOUS | Status: AC

## 2020-11-02 NOTE — ED Triage Notes (Addendum)
Patient arrives to ED BIB [facility] due to Hypoglycemia and Unresponsiveness. Per EMS patient lives at home with her older brother who states that patient for the most part is independent. LSW around 0800 this morning when patient took her morning medications. Per the patient's brother around 1400 he went to go check on her and she was unresponsive. Upon EMS arrival patient's CBG read "LOW" indicating Less than 15 on EMS glucometer. 12.5 Grams D10 administered brining CBG up to 90, Patient was a/o x4. In route patient went unresponsive  CBG dropped to 40 and HR dropped to 30 and 12.5 Grams of D10 was administered brining CBG to 190. Patient currently a/o x4 and CBG is 60. Patient is very hard of hearing.  BP 104/70 HR 58 CBG 190

## 2020-11-02 NOTE — ED Provider Notes (Signed)
[facility] EMERGENCY DEPARTMENT Provider Note   CSN: [id number] Arrival date & time: 11/02/20  1546     History Chief Complaint  Patient presents with   Hypoglycemia    Christine Benitez is a 60 y.o. female past medical history of chronic systolic CHF, nonischemic cardiomyopathy, insulin-dependent type 2 diabetes, hypertension, brought into the ED from home via EMS for altered mental status.  Patient's brother reported to EMS that she had a normal morning and is rather independent at baseline.  She took her medications this morning around 8 AM, when he went to check on her around 2 PM she was unresponsive.  EMS noted CBG of less than 15.  She was given 12.5 g of D10 with CBG up to 90, however patient became unresponsive once again and CBG dropped to 40.  She was given 12.5 g of D10 again which brought CBG up to 190.  On arrival CBG is 60.  Patient is very hard of hearing and is altered, history is limited due to altered mental status.  She states she took 20 units of insulin today, is unable to tell me what time or which insulin she took.  She does states she ate a sandwich today as well.  No further history is able to be obtained from patient due to worsening mental status on examination.  Per review of medical record, patient was recently admitted on 10/24/2020 and discharged on the 21st.  Per discharge summary it appears her Lantus dose was increased from 18 units daily to 24 units daily.  She is also prescribed NovoLog 10 units with meals.  She was also noted to have history of poor appetite and anorexia, she was referred to palliative care for this.  This is thought to be due to her severe chronic systolic CHF.  The history is provided by the EMS personnel. The history is limited by the condition of the patient.      Past Medical History:  Diagnosis Date   Abnormal LFTs    a. 05/2014 -  ALT 52, alk phos 130.   Chronic systolic CHF (congestive heart failure) ([place])     a. Dx 05/2014 - echo at [facility] - EF 30-35%, moderate LVH, no rWMA, mild to moderate MR, moderate PH with PA pressure 60-30mmHg, mild to moderate pericardial effusion. EF 30-35% by cath.   History of blood transfusion 1986   "related to C-section"   Hypertension    Insulin dependent diabetes mellitus    Iron deficiency anemia    NICM (nonischemic cardiomyopathy) ([place])    a. [place] 06/11/14:  minor nonobstructive CAD with mild irregularities in the LAD less than 10-20%, LCx with mild disease less than 20%, no significant disease in the RCA, EF 30-35%.   Pericardial effusion    a. Mild-moderate pericardial effusion by echo at [facility].   Pulmonary hypertension ([place])    a. Moderate PH by echo at [facility].   Tobacco abuse     Patient Active Problem List   Diagnosis Date Noted   Hypoglycemia 11/02/2020   Proteus mirabilis infection 10/25/2020   Acute lower UTI 10/24/2020   Urinary tract infection 10/24/2020   Failure to thrive in adult 10/24/2020   Pericardial effusion    Iron deficiency anemia    Chronic systolic CHF (congestive heart failure) (HCC)    Heart failure with reduced ejection fraction ([place]) 07/18/2020   History of CVA (cerebrovascular accident) 07/18/2020   Nonischemic cardiomyopathy ([place]) 07/18/2020   Type 2  diabetes mellitus with complication, with long-term current use of insulin ([place]) 07/18/2020   Tobacco use 07/18/2020   Mixed hyperlipidemia 07/18/2020   Abnormal echocardiogram 07/18/2020   Resides in long term care facility 06/20/2020   Protein-calorie malnutrition, severe 03/24/2020   Pain    Altered mental status    DNR (do not resuscitate)    DNI (do not intubate)    Encounter for hospice care discussion    Goals of care, counseling/discussion    Palliative care by specialist    AKI (acute kidney injury) ([place])    Cardiogenic shock ([place]) 03/14/2020   Acute urinary retention 03/13/2020   Chronic cholecystitis with calculus 03/12/2020   Cerebral thrombosis with  cerebral infarction 03/12/2020   Abnormal LFTs    Hypertension    NICM (nonischemic cardiomyopathy) ([place])    Pulmonary hypertension ([place])    Tobacco abuse 06/10/2014   Diabetes mellitus ([place]) 06/10/2014   Family history of premature CAD 06/10/2014   History of blood transfusion 1986    Past Surgical History:  Procedure Laterality Date   APPENDECTOMY  ~ 2003   [place] N/A 06/11/2014   Procedure: LEFT HEART CATHETERIZATION WITH CORONARY ANGIOGRAM;  Surgeon: Peter M Martinique, MD;  Location: [facility] CATH LAB;  Service: Cardiovascular;  Laterality: N/A;     OB History   No obstetric history on file.     Family History  Problem Relation Age of Onset   Coronary artery disease Mother    Diabetes Mother    Lupus Mother    Stroke Mother    Lung cancer Father     Social History   Tobacco Use   Smoking status: Former    Packs/day: 0.50    Years: 22.00    Pack years: 11.00    Types: Cigarettes    Quit date: 03/26/2015    Years since quitting: 5.6   Smokeless tobacco: Never  Substance Use Topics   Alcohol use: No   Drug use: No    Home Medications Prior to Admission medications   Medication Sig Start Date End Date Taking? Authorizing Provider  albuterol (VENTOLIN HFA) 108 (90 Base) MCG/ACT inhaler Inhale 2 puffs into the lungs every 6 (six) hours as needed for wheezing or shortness of breath. 10/28/20  Yes Allie Bossier, MD  aspirin 81 MG EC tablet TAKE 1 TABLET (81 MG TOTAL) BY MOUTH DAILY. Patient taking differently: Take 81 mg by mouth daily. 09/10/14  Yes Martinique, Peter M, MD  atorvastatin (LIPITOR) 40 MG tablet Take 1 tablet (40 mg total) by mouth every evening. 03/26/20  Yes Sanjuan Dame, MD  clopidogrel (PLAVIX) 75 MG tablet Take 1 tablet (75 mg total) by mouth daily. 10/28/20 10/28/21 Yes Allie Bossier, MD  dronabinol (MARINOL) 5 MG capsule Take 1 capsule (5 mg total) by mouth 2 (two) times daily before  lunch and supper. 10/28/20  Yes Allie Bossier, MD  insulin aspart (NOVOLOG FLEXPEN) 100 UNIT/ML FlexPen Inject 10 Units into the skin 3 (three) times daily with meals. 10/28/20  Yes Allie Bossier, MD  insulin glargine (LANTUS) 100 UNIT/ML Solostar Pen Inject 24 Units into the skin daily. 10/28/20  Yes Allie Bossier, MD  Insulin Pen Needle 32G X 4 MM MISC use as directed 10/28/20  Yes Allie Bossier, MD  ivabradine (CORLANOR) 5 MG TABS tablet Take 0.5 tablets (2.5 mg total) by mouth 2 (two) times daily with a meal. 10/16/20  Yes Lyda Jester M, PA-C  mirtazapine (REMERON SOL-TAB) 15 MG disintegrating tablet Take 0.5 tablets (7.5 mg total) by mouth at bedtime. Patient taking differently: Take 15 mg by mouth at bedtime. 03/26/20  Yes Sanjuan Dame, MD  clopidogrel (PLAVIX) 75 MG tablet Take 1 tablet (75 mg total) by mouth daily. Patient not taking: No sig reported 10/28/20   Allie Bossier, MD    Allergies    Patient has no known allergies.  Review of Systems   Review of Systems  Unable to perform ROS: Mental status change   Physical Exam Updated Vital Signs BP 117/73   Pulse 62   Temp 98 F (36.7 C) (Oral)   Resp 18   Ht $R'5\' 4"'sm$  (1.626 m)   Wt 42.6 kg   SpO2 100%   BMI 16.14 kg/m   Physical Exam Vitals and nursing note reviewed.  Constitutional:      Appearance: She is well-developed.     Comments: Cachectic. Hard of hearing. Drowsy with poor attention  HENT:     Head: Normocephalic and atraumatic.  Eyes:     Conjunctiva/sclera: Conjunctivae normal.  Cardiovascular:     Rate and Rhythm: Normal rate and regular rhythm.  Pulmonary:     Effort: Pulmonary effort is normal. No respiratory distress.     Breath sounds: Normal breath sounds.  Abdominal:     General: Bowel sounds are normal.     Palpations: Abdomen is soft.     Tenderness: There is no abdominal tenderness.  Skin:    General: Skin is warm.  Psychiatric:        Behavior: Behavior normal.    ED  Results / Procedures / Treatments   Labs (all labs ordered are listed, but only abnormal results are displayed) Labs Reviewed  CBC WITH DIFFERENTIAL/PLATELET - Abnormal; Notable for the following components:      Result Value   RBC 3.08 (*)    Hemoglobin 9.7 (*)    HCT 31.5 (*)    MCV 102.3 (*)    All other components within normal limits  BASIC METABOLIC PANEL - Abnormal; Notable for the following components:   BUN 23 (*)    Creatinine, Ser 1.25 (*)    GFR, Estimated 50 (*)    All other components within normal limits  CBG MONITORING, ED - Abnormal; Notable for the following components:   Glucose-Capillary 60 (*)    All other components within normal limits  I-STAT CHEM 8, ED - Abnormal; Notable for the following components:   Potassium 8.2 (*)    BUN 38 (*)    Creatinine, Ser 1.30 (*)    Glucose, Bld 24 (*)    Hemoglobin 9.9 (*)    HCT 29.0 (*)    All other components within normal limits  CBG MONITORING, ED - Abnormal; Notable for the following components:   Glucose-Capillary 16 (*)    All other components within normal limits  CBG MONITORING, ED - Abnormal; Notable for the following components:   Glucose-Capillary 64 (*)    All other components within normal limits  CBG MONITORING, ED - Abnormal; Notable for the following components:   Glucose-Capillary 100 (*)    All other components within normal limits  SARS CORONAVIRUS 2 (TAT 6-24 HRS)  CBG MONITORING, ED  CBG MONITORING, ED    EKG None  Radiology No results found.  Procedures .Critical Care  Date/Time: 11/02/2020 10:15 PM Performed by: Roch Quach, [place] N, PA-C Authorized by: Kylen Ismael, [place] N, PA-C  Critical care provider statement:    Critical care time (minutes):  45   Critical care was necessary to treat or prevent imminent or life-threatening deterioration of the following conditions:  Endocrine crisis   Critical care was time spent personally by me on the following activities:  Discussions with  consultants, evaluation of patient's response to treatment, examination of patient, ordering and performing treatments and interventions, ordering and review of laboratory studies, ordering and review of radiographic studies, pulse oximetry, re-evaluation of patient's condition, obtaining history from patient or surrogate and review of old charts   Medications Ordered in ED Medications  dextrose 10 % infusion ( Intravenous New Bag/Given 11/02/20 1805)  dextrose 50 % solution 50 mL (50 mLs Intravenous Given 11/02/20 1641)    ED Course  I have reviewed the triage vital signs and the nursing notes.  Pertinent labs & imaging results that were available during my care of the patient were reviewed by me and considered in my medical decision making (see chart for details).  Clinical Course as of 11/02/20 2217  Nancy Fetter Nov 02, 2020  1629 Patient is altered and very difficult of hearing. States she took 20u insulin today and ate a sandwich. Very difficult to get her to answer questions, she is very drowsy with poor attention. RN made aware of importance of starting D10 with amp D50... [JR]  1728 Patient with improved mentation after D50. Will recheck cbg, pending D10W from pharmacy [JR]  1823 Potassium(!!): 8.2 Lab reported hemolyzed likely skewing result. Will need to redraw BMP [JR]  1927 Requested from RN that repeat BMP is drawn [JR]  2110 Patient mentation is improved. Confirms and  states she took 20 units of insulin this morning, but unsure which. She seemed unaware of 2 different types of insulin that she is prescribed. Based on the recurrent hypoglycemia, seems consistent with a long-acting insulin that was taken this morning. Will admit for further management of hypoglycemia and close CBG monitoring [JR]    Clinical Course User Index [JR] Griselda Bramblett, [place] N, PA-C   MDM Rules/Calculators/A&P                          Patient 60 year old female brought in from home with altered mental status.   Found to be severely hypoglycemic with recurring hypoglycemia despite multiple doses of D10 by EMS.  She is hypoglycemic at 60 on arrival to the ED and continues to downtrend down to 16.  She was given 1 ampoule D50 and started on D10W infusion at 100 cc/h.  Blood sugar is stabilizing however will need close monitoring and medical admission.  After further discussion of patient, she states she took 20 units of insulin today and had a sandwich at some point, however she is unable to tell me which insulin she took whether it was Lantus or NovoLog.  She seemed to not realize there were 2 different types of insulin she is prescribed.  As it is suspected that her medications were taken around 8 AM, seems as though it was Lantus  Patient is admitted to the hospital service.   Final Clinical Impression(s) / ED Diagnoses Final diagnoses:  Hypoglycemia    Rx / DC Orders ED Discharge Orders     None        Elva Mauro, [place] N, PA-C 11/02/20 2218    Charlesetta Shanks, MD 11/06/20 [phone]

## 2020-11-02 NOTE — H&P (Addendum)
History and Physical    Christine Benitez LTJ:[id number] DOB: 01/09/1961 DOA: 11/02/2020  PCP: Claiborne Billings, MD Patient coming from: Home  Chief Complaint: Hypoglycemia, unresponsive  HPI: Christine Benitez is a 60 y.o. female with medical history significant of insulin-dependent diabetes, chronic systolic CHF, nonischemic cardiomyopathy, pulmonary hypertension, CVA, hypertension, CKD stage IIIa presented to the ED via EMS for hypoglycemia and unresponsiveness.  EMS reported the patient took her morning medications around 8 AM and when her brother went to check on her at 2 PM she was found unresponsive.  Upon EMS arrival her blood glucose was less than 15.  She was given 12.5 g of D10 and blood glucose improved to 90.  Patient then became awake and alert.  However, in route to the hospital she became unresponsive again and blood glucose was checked and was down to 40 and became bradycardic with heart rate 30.  She was given additional 12.5 g of D10 and blood glucose improved to 190.  Blood glucose again dropped to 60 at the time of arrival to the ED and down to 16 and subsequent CBG check.  Patient was very drowsy with poor attention.  Labs showing WBC 6.1, hemoglobin 9.7 (at baseline), platelet count 243K.  Potassium 8.2 on i-STAT chemistry but sample was hemolyzed.  Labs repeated and BMP showing sodium 138, potassium 4.9, chloride 107, bicarb 24, BUN 23, creatinine 1.2 (at baseline), glucose 86. Mentation improved and blood glucose improved to 100 after she was given D50 and started on D10 infusion.  Bradycardia resolved.  Per chart review, patient was recently admitted 10/24/2020 and discharged on 10/28/2020.  The dose of her home Lantus was increased from 18 to 24 units daily she was also prescribed NovoLog 10 units with meals.  Patient was noted to have poor appetite, anorexia, cachexia (BMI 16.14), failure to thrive felt to be due to her chronic systolic CHF and was referred to palliative care.  She was  also treated for a UTI.  Patient is hard of hearing.  States yesterday her blood sugar dropped to less than 15 and she was told she was unconscious and the ambulance had to be called.  She takes insulin but does not know what dose.  She thinks she takes "Novolin 30 units."  States she takes one type of insulin in the morning and afternoon and another type of insulin at night but she does not know the names.  She is eating food at home.  Complaining of constipation this past week but no vomiting or abdominal pain.  She did have a small bowel movement yesterday.  No other complaints.  Denies cough, fevers, shortness of breath, or chest pain.  Review of Systems:  All systems reviewed and apart from history of presenting illness, are negative.  Past Medical History:  Diagnosis Date   Abnormal LFTs    a. 05/2014 -  ALT 52, alk phos 130.   Chronic systolic CHF (congestive heart failure) ([place])    a. Dx 05/2014 - echo at [facility] - EF 30-35%, moderate LVH, no rWMA, mild to moderate MR, moderate PH with PA pressure 60-57mmHg, mild to moderate pericardial effusion. EF 30-35% by cath.   History of blood transfusion 1986   "related to C-section"   Hypertension    Insulin dependent diabetes mellitus    Iron deficiency anemia    NICM (nonischemic cardiomyopathy) ([place])    a. LHC 06/11/14:  minor nonobstructive CAD with mild irregularities in the LAD less than 10-20%,  LCx with mild disease less than 20%, no significant disease in the RCA, EF 30-35%.   Pericardial effusion    a. Mild-moderate pericardial effusion by echo at [facility].   Pulmonary hypertension ([place])    a. Moderate PH by echo at [facility].   Tobacco abuse     Past Surgical History:  Procedure Laterality Date   APPENDECTOMY  ~ 2003   [place] WITH CORONARY ANGIOGRAM N/A 06/11/2014   Procedure: LEFT HEART CATHETERIZATION WITH CORONARY ANGIOGRAM;  Surgeon: Peter M Martinique, MD;  Location: [facility] CATH LAB;  Service:  Cardiovascular;  Laterality: N/A;     reports that she quit smoking about 5 years ago. Her smoking use included cigarettes. She has a 11.00 pack-year smoking history. She has never used smokeless tobacco. She reports that she does not drink alcohol and does not use drugs.  No Known Allergies  Family History  Problem Relation Age of Onset   Coronary artery disease Mother    Diabetes Mother    Lupus Mother    Stroke Mother    Lung cancer Father     Prior to Admission medications   Medication Sig Start Date End Date Taking? Authorizing Provider  albuterol (VENTOLIN HFA) 108 (90 Base) MCG/ACT inhaler Inhale 2 puffs into the lungs every 6 (six) hours as needed for wheezing or shortness of breath. 10/28/20  Yes Allie Bossier, MD  aspirin 81 MG EC tablet TAKE 1 TABLET (81 MG TOTAL) BY MOUTH DAILY. Patient taking differently: Take 81 mg by mouth daily. 09/10/14  Yes Martinique, Peter M, MD  atorvastatin (LIPITOR) 40 MG tablet Take 1 tablet (40 mg total) by mouth every evening. 03/26/20  Yes Sanjuan Dame, MD  clopidogrel (PLAVIX) 75 MG tablet Take 1 tablet (75 mg total) by mouth daily. 10/28/20 10/28/21 Yes Allie Bossier, MD  dronabinol (MARINOL) 5 MG capsule Take 1 capsule (5 mg total) by mouth 2 (two) times daily before lunch and supper. 10/28/20  Yes Allie Bossier, MD  insulin aspart (NOVOLOG FLEXPEN) 100 UNIT/ML FlexPen Inject 10 Units into the skin 3 (three) times daily with meals. 10/28/20  Yes Allie Bossier, MD  insulin glargine (LANTUS) 100 UNIT/ML Solostar Pen Inject 24 Units into the skin daily. 10/28/20  Yes Allie Bossier, MD  Insulin Pen Needle 32G X 4 MM MISC use as directed 10/28/20  Yes Allie Bossier, MD  ivabradine (CORLANOR) 5 MG TABS tablet Take 0.5 tablets (2.5 mg total) by mouth 2 (two) times daily with a meal. 10/16/20  Yes Lyda Jester M, PA-C  mirtazapine (REMERON SOL-TAB) 15 MG disintegrating tablet Take 0.5 tablets (7.5 mg total) by mouth at bedtime. Patient  taking differently: Take 15 mg by mouth at bedtime. 03/26/20  Yes Sanjuan Dame, MD  clopidogrel (PLAVIX) 75 MG tablet Take 1 tablet (75 mg total) by mouth daily. Patient not taking: No sig reported 10/28/20   Allie Bossier, MD    Physical Exam: Vitals:   11/02/20 2248 11/02/20 2254 11/02/20 2300 11/03/20 0000  BP: 120/70 (!) 131/96    Pulse: 61 63 61 69  Resp: $Remo'14 17 20 16  'QHeqB$ Temp:  98.5 F (36.9 C)    TempSrc:  Oral    SpO2: 100% 100% 100% 100%  Weight: 42.6 kg     Height: $Remove'5\' 4"'UPwkAnL$  (1.626 m)       Physical Exam Constitutional:      General: She is not in acute distress.  HENT:     Head: Normocephalic and atraumatic.  Eyes:     Extraocular Movements: Extraocular movements intact.     Conjunctiva/sclera: Conjunctivae normal.  Cardiovascular:     Rate and Rhythm: Normal rate and regular rhythm.     Pulses: Normal pulses.  Pulmonary:     Effort: Pulmonary effort is normal. No respiratory distress.     Breath sounds: Normal breath sounds. No wheezing or rales.  Abdominal:     General: Bowel sounds are normal. There is no distension.     Palpations: Abdomen is soft.     Tenderness: There is no abdominal tenderness. There is no guarding or rebound.  Musculoskeletal:        General: No swelling or tenderness.     Cervical back: Normal range of motion and neck supple.  Skin:    General: Skin is warm and dry.  Neurological:     General: No focal deficit present.     Mental Status: She is alert and oriented to person, place, and time.     Labs on Admission: I have personally reviewed following labs and imaging studies  CBC: Recent Labs  Lab 10/27/20 0119 10/28/20 0153 11/02/20 1628 11/02/20 1654  WBC 5.7 5.8 6.1  --   NEUTROABS 3.0 2.9 3.6  --   HGB 9.5* 10.0* 9.7* 9.9*  HCT 29.6* 31.1* 31.5* 29.0*  MCV 96.4 96.3 102.3*  --   PLT 276 297 243  --    Basic Metabolic Panel: Recent Labs  Lab 10/27/20 0119 10/28/20 0153 11/02/20 1654 11/02/20 1823  NA 133* 136  137 138  K 5.1 4.6 8.2* 4.9  CL 101 104 109 107  CO2 24 25  --  24  GLUCOSE 424* 113* 24* 86  BUN 31* 28* 38* 23*  CREATININE 1.48* 1.27* 1.30* 1.25*  CALCIUM 8.9 9.5  --  9.3  MG 2.0 2.0  --   --   PHOS 3.6 4.1  --   --    GFR: Estimated Creatinine Clearance: 33 mL/min (A) (by C-G formula based on SCr of 1.25 mg/dL (H)). Liver Function Tests: Recent Labs  Lab 10/27/20 0119 10/28/20 0153  AST 19 23  ALT 20 21  ALKPHOS 57 56  BILITOT 0.4 0.3  PROT 6.2* 6.2*  ALBUMIN 2.9* 3.0*   No results for input(s): LIPASE, AMYLASE in the last 168 hours. No results for input(s): AMMONIA in the last 168 hours. Coagulation Profile: No results for input(s): INR, PROTIME in the last 168 hours. Cardiac Enzymes: No results for input(s): CKTOTAL, CKMB, CKMBINDEX, TROPONINI in the last 168 hours. BNP (last 3 results) No results for input(s): PROBNP in the last 8760 hours. HbA1C: No results for input(s): HGBA1C in the last 72 hours. CBG: Recent Labs  Lab 11/02/20 1803 11/02/20 1930 11/02/20 2046 11/02/20 2126 11/02/20 2252  GLUCAP 64* 77 95 100* 167*   Lipid Profile: No results for input(s): CHOL, HDL, LDLCALC, TRIG, CHOLHDL, LDLDIRECT in the last 72 hours. Thyroid Function Tests: No results for input(s): TSH, T4TOTAL, FREET4, T3FREE, THYROIDAB in the last 72 hours. Anemia Panel: No results for input(s): VITAMINB12, FOLATE, FERRITIN, TIBC, IRON, RETICCTPCT in the last 72 hours. Urine analysis:    Component Value Date/Time   COLORURINE YELLOW 10/24/2020 [place] 10/24/2020 1248   LABSPEC 1.010 10/24/2020 1248   PHURINE 8.0 10/24/2020 1248   GLUCOSEU 150 (A) 10/24/2020 1248   HGBUR NEGATIVE 10/24/2020 [place] 10/24/2020 1248  KETONESUR NEGATIVE 10/24/2020 1248   PROTEINUR NEGATIVE 10/24/2020 1248   NITRITE POSITIVE (A) 10/24/2020 1248   LEUKOCYTESUR MODERATE (A) 10/24/2020 1248    Radiological Exams on Admission: No results  found.  EKG: Independently reviewed.  Sinus rhythm, T wave inversions in lateral leads similar to prior tracings.  Assessment/Plan Principal Problem:   Hypoglycemia Active Problems:   Type 2 diabetes mellitus with complication, with long-term current use of insulin (HCC)   Chronic systolic CHF (congestive heart failure) (HCC)   Constipation   Cachexia (HCC)   Severe hypoglycemia [place] insulin-dependent diabetes Severely hypoglycemic with blood glucose less than 15 and was unresponsive at the time of EMS arrival.  Continued to be hypoglycemic despite being given multiple doses of dextrose and had to be started on D10 infusion.  Mentation and blood glucose now improved.  She was recently admitted for poor appetite, anorexia, cachexia, and failure to thrive.  It seems she was hyperglycemic at that time so the dose of her home Lantus was increased from 18 to 24 units daily and she was also prescribed NovoLog 10 units with meals during this hospitalization. -Hold insulin.  Give regular high carbohydrate diet at this time.  Continue D10 infusion.  Frequent CBG checks every 2 hours.  Consult diabetes coordinator.  Addendum: A1c 7.7 on 0/35/0093  Chronic systolic CHF/nonischemic cardiomyopathy EF was 20 to 25% on echo done in November 2021 but improved to 50 to 55% on recent echo done June/18/2022.  No signs of volume overload at this time. -Continue ivabradine and monitor volume status closely.  History of CVA -Continue aspirin and Plavix Addendum: Also continue Lipitor  Hypertension Stable.  Currently normotensive.  CKD stage IIIa Creatinine 1.2, at baseline.  Anorexia, cachexia (BMI 16.12) -Continue Marinol and consult nutritionist.  Constipation No vomiting or abdominal pain.  Had a small bowel movement yesterday.  Abdominal exam benign. -MiraLAX as needed  DVT prophylaxis: Lovenox Code Status: Patient wishes to be DNR. Family Communication: No family available at this  time. Disposition Plan: Status is: Observation  The patient remains OBS appropriate and will d/c before 2 midnights.  Dispo: The patient is from: Home              Anticipated d/c is to: Home              Patient currently is not medically stable to d/c.   Difficult to place patient No  Level of care: Level of care: Progressive  The medical decision making on this patient was of high complexity and the patient is at high risk for clinical deterioration, therefore this is a level 3 visit.  Shela Leff MD [facility]  If 7PM-7AM, please contact night-coverage [email or web address]  11/03/2020, 1:12 AM

## 2020-11-02 NOTE — Plan of Care (Signed)
Discussed with patient plan of care for the evening, pain management and pure wick with some teach back displayed  Problem: Education: Goal: Knowledge of General Education information will improve Description: Including pain rating scale, medication(s)/side effects and non-pharmacologic comfort measures Outcome: Progressing

## 2020-11-02 NOTE — ED Notes (Signed)
CBG- 60 

## 2020-11-03 ENCOUNTER — Encounter (HOSPITAL_COMMUNITY): Payer: Self-pay | Admitting: Internal Medicine

## 2020-11-03 DIAGNOSIS — I959 Hypotension, unspecified: Secondary | ICD-10-CM | POA: Diagnosis present

## 2020-11-03 DIAGNOSIS — I69319 Unspecified symptoms and signs involving cognitive functions following cerebral infarction: Secondary | ICD-10-CM | POA: Diagnosis not present

## 2020-11-03 DIAGNOSIS — Z66 Do not resuscitate: Secondary | ICD-10-CM | POA: Diagnosis present

## 2020-11-03 DIAGNOSIS — E871 Hypo-osmolality and hyponatremia: Secondary | ICD-10-CM | POA: Diagnosis present

## 2020-11-03 DIAGNOSIS — R64 Cachexia: Secondary | ICD-10-CM | POA: Diagnosis present

## 2020-11-03 DIAGNOSIS — I69354 Hemiplegia and hemiparesis following cerebral infarction affecting left non-dominant side: Secondary | ICD-10-CM | POA: Diagnosis not present

## 2020-11-03 DIAGNOSIS — I130 Hypertensive heart and chronic kidney disease with heart failure and stage 1 through stage 4 chronic kidney disease, or unspecified chronic kidney disease: Secondary | ICD-10-CM | POA: Diagnosis present

## 2020-11-03 DIAGNOSIS — N1831 Chronic kidney disease, stage 3a: Secondary | ICD-10-CM | POA: Diagnosis present

## 2020-11-03 DIAGNOSIS — E875 Hyperkalemia: Secondary | ICD-10-CM | POA: Diagnosis present

## 2020-11-03 DIAGNOSIS — K5900 Constipation, unspecified: Secondary | ICD-10-CM

## 2020-11-03 DIAGNOSIS — Z20822 Contact with and (suspected) exposure to covid-19: Secondary | ICD-10-CM | POA: Diagnosis present

## 2020-11-03 DIAGNOSIS — Z681 Body mass index (BMI) 19 or less, adult: Secondary | ICD-10-CM | POA: Diagnosis not present

## 2020-11-03 DIAGNOSIS — E162 Hypoglycemia, unspecified: Secondary | ICD-10-CM

## 2020-11-03 DIAGNOSIS — E440 Moderate protein-calorie malnutrition: Secondary | ICD-10-CM

## 2020-11-03 DIAGNOSIS — E11649 Type 2 diabetes mellitus with hypoglycemia without coma: Secondary | ICD-10-CM | POA: Diagnosis present

## 2020-11-03 DIAGNOSIS — G9341 Metabolic encephalopathy: Secondary | ICD-10-CM | POA: Diagnosis present

## 2020-11-03 DIAGNOSIS — R627 Adult failure to thrive: Secondary | ICD-10-CM | POA: Diagnosis present

## 2020-11-03 DIAGNOSIS — E1165 Type 2 diabetes mellitus with hyperglycemia: Secondary | ICD-10-CM | POA: Diagnosis not present

## 2020-11-03 DIAGNOSIS — E1122 Type 2 diabetes mellitus with diabetic chronic kidney disease: Secondary | ICD-10-CM | POA: Diagnosis present

## 2020-11-03 DIAGNOSIS — D631 Anemia in chronic kidney disease: Secondary | ICD-10-CM | POA: Diagnosis present

## 2020-11-03 DIAGNOSIS — E869 Volume depletion, unspecified: Secondary | ICD-10-CM | POA: Diagnosis present

## 2020-11-03 DIAGNOSIS — I5022 Chronic systolic (congestive) heart failure: Secondary | ICD-10-CM | POA: Diagnosis present

## 2020-11-03 DIAGNOSIS — I272 Pulmonary hypertension, unspecified: Secondary | ICD-10-CM | POA: Diagnosis present

## 2020-11-03 DIAGNOSIS — I313 Pericardial effusion (noninflammatory): Secondary | ICD-10-CM | POA: Diagnosis present

## 2020-11-03 DIAGNOSIS — I428 Other cardiomyopathies: Secondary | ICD-10-CM | POA: Diagnosis present

## 2020-11-03 LAB — GLUCOSE, CAPILLARY
Glucose-Capillary: 269 mg/dL — ABNORMAL HIGH (ref 70–99)
Glucose-Capillary: 302 mg/dL — ABNORMAL HIGH (ref 70–99)
Glucose-Capillary: 256 mg/dL — ABNORMAL HIGH (ref 70–99)
Glucose-Capillary: 239 mg/dL — ABNORMAL HIGH (ref 70–99)
Glucose-Capillary: 276 mg/dL — ABNORMAL HIGH (ref 70–99)
Glucose-Capillary: 316 mg/dL — ABNORMAL HIGH (ref 70–99)
Glucose-Capillary: 450 mg/dL — ABNORMAL HIGH (ref 70–99)
Glucose-Capillary: 372 mg/dL — ABNORMAL HIGH (ref 70–99)
Glucose-Capillary: 120 mg/dL — ABNORMAL HIGH (ref 70–99)

## 2020-11-03 LAB — SARS CORONAVIRUS 2 (TAT 6-24 HRS): SARS Coronavirus 2: NEGATIVE

## 2020-11-03 MED ORDER — ENSURE ENLIVE PO LIQD
237.0000 mL | Freq: Two times a day (BID) | ORAL | Status: DC
  Administered 2020-11-03 – 2020-11-17 (×26): 237 mL via ORAL

## 2020-11-03 MED ORDER — LACTATED RINGERS IV BOLUS
500.0000 mL | Freq: Once | INTRAVENOUS | Status: AC
  Administered 2020-11-03: 500 mL via INTRAVENOUS

## 2020-11-03 MED ORDER — IVABRADINE HCL 5 MG PO TABS
2.5000 mg | ORAL_TABLET | Freq: Two times a day (BID) | ORAL | Status: DC
  Administered 2020-11-03 – 2020-11-18 (×30): 2.5 mg via ORAL
  Filled 2020-11-03 (×33): qty 1

## 2020-11-03 MED ORDER — INSULIN GLARGINE 100 UNIT/ML ~~LOC~~ SOLN
8.0000 [IU] | Freq: Every day | SUBCUTANEOUS | Status: DC
  Administered 2020-11-03 – 2020-11-05 (×3): 8 [IU] via SUBCUTANEOUS
  Filled 2020-11-03 (×3): qty 0.08

## 2020-11-03 MED ORDER — MIRTAZAPINE 15 MG PO TBDP
15.0000 mg | ORAL_TABLET | Freq: Every day | ORAL | Status: DC
  Administered 2020-11-03 – 2020-11-17 (×16): 15 mg via ORAL
  Filled 2020-11-03 (×17): qty 1

## 2020-11-03 MED ORDER — INSULIN ASPART 100 UNIT/ML IJ SOLN
0.0000 [IU] | Freq: Three times a day (TID) | INTRAMUSCULAR | Status: DC
  Administered 2020-11-04: 1 [IU] via SUBCUTANEOUS
  Administered 2020-11-04: 3 [IU] via SUBCUTANEOUS
  Administered 2020-11-05 (×2): 2 [IU] via SUBCUTANEOUS

## 2020-11-03 MED ORDER — CLOPIDOGREL BISULFATE 75 MG PO TABS
75.0000 mg | ORAL_TABLET | Freq: Every day | ORAL | Status: DC
  Administered 2020-11-03 – 2020-11-18 (×16): 75 mg via ORAL
  Filled 2020-11-03 (×16): qty 1

## 2020-11-03 MED ORDER — ENOXAPARIN SODIUM 300 MG/3ML IJ SOLN
20.0000 mg | INTRAMUSCULAR | Status: DC
  Administered 2020-11-03 – 2020-11-18 (×16): 20 mg via SUBCUTANEOUS
  Filled 2020-11-03 (×16): qty 0.2

## 2020-11-03 MED ORDER — ACETAMINOPHEN 325 MG PO TABS
650.0000 mg | ORAL_TABLET | Freq: Four times a day (QID) | ORAL | Status: DC | PRN
  Administered 2020-11-04 – 2020-11-16 (×5): 650 mg via ORAL
  Filled 2020-11-03 (×5): qty 2

## 2020-11-03 MED ORDER — ADULT MULTIVITAMIN W/MINERALS CH
1.0000 | ORAL_TABLET | Freq: Every day | ORAL | Status: DC
  Administered 2020-11-03 – 2020-11-18 (×15): 1 via ORAL
  Filled 2020-11-03 (×15): qty 1

## 2020-11-03 MED ORDER — ASPIRIN EC 81 MG PO TBEC
81.0000 mg | DELAYED_RELEASE_TABLET | Freq: Every day | ORAL | Status: DC
  Administered 2020-11-03 – 2020-11-18 (×16): 81 mg via ORAL
  Filled 2020-11-03 (×16): qty 1

## 2020-11-03 MED ORDER — POLYETHYLENE GLYCOL 3350 17 G PO PACK
17.0000 g | PACK | Freq: Every day | ORAL | Status: DC | PRN
  Administered 2020-11-08: 17 g via ORAL
  Filled 2020-11-03: qty 1

## 2020-11-03 MED ORDER — INSULIN ASPART 100 UNIT/ML IJ SOLN
10.0000 [IU] | Freq: Once | INTRAMUSCULAR | Status: AC
  Administered 2020-11-03: 10 [IU] via SUBCUTANEOUS

## 2020-11-03 MED ORDER — DRONABINOL 2.5 MG PO CAPS
5.0000 mg | ORAL_CAPSULE | Freq: Two times a day (BID) | ORAL | Status: DC
  Administered 2020-11-03 – 2020-11-18 (×31): 5 mg via ORAL
  Filled 2020-11-03 (×31): qty 2

## 2020-11-03 MED ORDER — ATORVASTATIN CALCIUM 40 MG PO TABS
40.0000 mg | ORAL_TABLET | Freq: Every evening | ORAL | Status: DC
  Administered 2020-11-03 – 2020-11-17 (×15): 40 mg via ORAL
  Filled 2020-11-03 (×15): qty 1

## 2020-11-03 MED ORDER — INSULIN ASPART 100 UNIT/ML IJ SOLN
10.0000 [IU] | Freq: Three times a day (TID) | INTRAMUSCULAR | Status: DC
  Administered 2020-11-04 – 2020-11-08 (×8): 10 [IU] via SUBCUTANEOUS

## 2020-11-03 MED ORDER — CLOPIDOGREL BISULFATE 75 MG PO TABS: 75.0000 mg | ORAL_TABLET | Freq: Every day | ORAL | Status: DC

## 2020-11-03 MED ORDER — ACETAMINOPHEN 650 MG RE SUPP: 650.0000 mg | Freq: Four times a day (QID) | RECTAL | Status: DC | PRN

## 2020-11-03 NOTE — Progress Notes (Signed)
Initial Nutrition Assessment  DOCUMENTATION CODES:   Non-severe (moderate) malnutrition in context of chronic illness  INTERVENTION:   -MVI with minerals daily -Ensure Enlive po BID, each supplement provides 350 kcal and 20 grams of protein  -Magic cup BID with meals, each supplement provides 290 kcal and 9 grams of protein   NUTRITION DIAGNOSIS:   Moderate Malnutrition related to chronic illness (CHF, CVA, DM) as evidenced by mild fat depletion, mild muscle depletion, moderate muscle depletion.  GOAL:   Patient will meet greater than or equal to 90% of their needs  MONITOR:   PO intake, Supplement acceptance, Labs, Weight trends, Skin, I & O's  REASON FOR ASSESSMENT:   Consult Assessment of nutrition requirement/status  ASSESSMENT:   Christine Benitez is a 60 y.o. female with medical history significant of insulin-dependent diabetes, chronic systolic CHF, nonischemic cardiomyopathy, pulmonary hypertension, CVA, hypertension, CKD stage IIIa presented to the ED via EMS for hypoglycemia and unresponsiveness.  EMS reported the patient took her morning medications around 8 AM and when her brother went to check on her at 2 PM she was found unresponsive.  Upon EMS arrival her blood glucose was less than 15.  She was given 12.5 g of D10 and blood glucose improved to 90.  Patient then became awake and alert.  However, in route to the hospital she became unresponsive again and blood glucose was checked and was down to 40 and became bradycardic with heart rate 30.  She was given additional 12.5 g of D10 and blood glucose improved to 190.  Blood glucose again dropped to 60 at the time of arrival to the ED and down to 16 and subsequent CBG check.  Patient was very drowsy with poor attention.  Labs showing WBC 6.1, hemoglobin 9.7 (at baseline), platelet count 243K.  Potassium 8.2 on i-STAT chemistry but sample was hemolyzed.  Labs repeated and BMP showing sodium 138, potassium 4.9, chloride 107,  bicarb 24, BUN 23, creatinine 1.2 (at baseline), glucose 86. Mentation improved and blood glucose improved to 100 after she was given D50 and started on D10 infusion.  Bradycardia resolved.  Pt admitted with severe hypoglycemia.   Reviewed I/O's: +1.1 L x 24 hours   UOP: 300 ml x 24 hours  Spoke with pt at bedside. She is HOH and unable to provide history. She was unable to answer RD questions despite RD speaking loudly and clearly, as well as rephrasing questions.    No meal completion documented in chart. Pt is on a regular diet, which this RD agrees with, which will provide her with the widest variety of food choices.   Reviewed wt hx; pt has experienced a 2.3% wt loss over the past month, which is significant for time frame.    Pt would greatly benefit from addition of oral nutrition supplements. Pt with malnutrition and would benefit from nutrient dense supplement. One Ensure Enlive supplement provides 350 kcals, 20 grams protein, and 44-45 grams of carbohydrate vs one Glucerna shake supplement, which provides 220 kcals, 10 grams of protein, and 26 grams of carbohydrate. Given pt's hx of DM, RD will reassess adequacy of PO intake, CBGS, and adjust supplement regimen as appropriate at follow-up.    DM coordinator consult pending.   Medications reviewed and include marinol and remeron.   Lab Results  Component Value Date   HGBA1C 7.7 (H) 10/24/2020   PTA DM medications are 10 units insulin aspart TID with meals and 24 units insulin glargine daily.  Labs reviewed: CBGS: 100-302 (inpatient orders for glycemic control are none).    NUTRITION - FOCUSED PHYSICAL EXAM:  Flowsheet Row Most Recent Value  Orbital Region Mild depletion  Upper Arm Region No depletion  Thoracic and Lumbar Region Mild depletion  Buccal Region No depletion  Temple Region Mild depletion  Clavicle Bone Region No depletion  Clavicle and Acromion Bone Region No depletion  Scapular Bone Region No depletion   Dorsal Hand Mild depletion  Patellar Region Moderate depletion  Anterior Thigh Region Moderate depletion  Posterior Calf Region Moderate depletion  Edema (RD Assessment) None  Hair Reviewed  Eyes Reviewed  Mouth Reviewed  Skin Reviewed  Nails Reviewed       Diet Order:   Diet Order             Diet regular Room service appropriate? Yes; Fluid consistency: Thin  Diet effective now                   EDUCATION NEEDS:   Education needs have been addressed  Skin:  Skin Assessment: Reviewed RN Assessment  Last BM:  11/02/20  Height:   Ht Readings from Last 1 Encounters:  11/02/20 5\' 4"  (1.626 m)    Weight:   Wt Readings from Last 1 Encounters:  11/02/20 42.6 kg    Ideal Body Weight:  54.5 kg  BMI:  Body mass index is 16.12 kg/m.  Estimated Nutritional Needs:   Kcal:  1500-1700  Protein:  70-85 grams  Fluid:  > 1.5 L    11/04/20, RD, LDN, CDCES Registered Dietitian II Certified Diabetes Care and Education Specialist Please refer to [facility] [address] for RD and/or RD on-call/weekend/after hours pager

## 2020-11-03 NOTE — Progress Notes (Addendum)
[facility] PCU coverage note.  The nursing staff reported that the patient's blood pressure was 74/64 mmHg. The rest of the vital signs were temperature 99.3 F, pulse 71, respirations 17, and O2 sat 97% on room air.a 500 mL LR bolus was given and her BP increased to 94/55 mmHg, but 33 minutes later decreased again to 87/56 mmHg.  Another 500 mL of LR bolus were ordered.  Sanda Klein, MD.  11/04/2020 0300 addendum: After the second bolus the patient's blood pressure now is 90/53 mmHg with a MAP of 65.  I have ordered a tablet of midodrine 5 mg p.o. and will begin LR of 50 mL/h over 20hours's maintenance fluids.

## 2020-11-03 NOTE — Progress Notes (Signed)
PROGRESS NOTE    Christine Benitez  EPP:[id number] DOB: 22-Aug-1960 DOA: 11/02/2020 PCP: Lupita Dawn, MD    Brief Narrative:  60 year old female with brittle diabetes, cardiomyopathy, chronic kidney disease, admitted to the hospital with hypoglycemia and acute metabolic encephalopathy.  Recent admission for urinary tract infection which she was discharged on 6/21.  Insulin doses were adjusted at that time.  Since her admission, her insulin has been held and blood sugars have since improved.  Lantus and NovoLog have been restarted at reduced doses.  Continue to monitor for stability of blood sugars.   Assessment & Plan:   Principal Problem:   Hypoglycemia Active Problems:   Type 2 diabetes mellitus with complication, with long-term current use of insulin (HCC)   Chronic systolic CHF (congestive heart failure) (HCC)   Constipation   Cachexia (HCC)   Malnutrition of moderate degree   Severe hypoglycemia with in the setting of type 2 diabetes, insulin-dependent -Patient has brittle insulin-dependent diabetes  -She takes both Lantus and NovoLog at home -She had a recent hospitalization reviewed doses were adjusted/increased -Patient reports compliance with medications and ensure she did not take any extra medications -She does admit that she does not eat on a regular schedule, mostly eats breakfast and then snacks through the day -The patient administers her own medications -With her blood sugars rebounded rather quickly, I suspect there may be an issue with administration of medications. -Patient is currently living at home with her daughter -May need to have increased home health services for more education/oversight  Chronic systolic congestive heart failure -Recent EF in 10/2018 showed improvement of EF to 50 to 55%. -Currently, volume status appears compensated  History of CVA -Continue aspirin, Plavix and statin  Chronic kidney disease stage IIIa -Creatinine is currently at  baseline  Anorexia/cachexia -Continue Marinol -Nutrition following   DVT prophylaxis:   Lovenox  Code Status: DNR Family Communication: Discussed with patient Disposition Plan: Status is: Inpatient  Remains inpatient appropriate because:Persistent severe electrolyte disturbances and Ongoing diagnostic testing needed not appropriate for outpatient work up  Dispo: The patient is from: Home              Anticipated d/c is to: Home              Patient currently is not medically stable to d/c.   Difficult to place patient No         Consultants:    Procedures:    Antimicrobials:      Subjective: Patient is awake and alert, denies any complaints  Objective: Vitals:   11/03/20 0715 11/03/20 0731 11/03/20 1153 11/03/20 1611  BP: (!) 88/43 107/73 128/70 104/66  Pulse: 64 64 66 68  Resp: 17 11 16 17   Temp: 97.9 F (36.6 C) 97.9 F (36.6 C) 97.8 F (36.6 C) 98 F (36.7 C)  TempSrc: Oral Oral Oral Oral  SpO2: 100% 100% 100% 100%  Weight:      Height:        Intake/Output Summary (Last 24 hours) at 11/03/2020 1955 Last data filed at 11/03/2020 1827 Gross per 24 hour  Intake 1859.86 ml  Output 1650 ml  Net 209.86 ml   Filed Weights   11/02/20 1619 11/02/20 2248  Weight: 42.6 kg 42.6 kg    Examination:  General exam: Appears calm and comfortable, she is extremely hard of hearing Respiratory system: Clear to auscultation. Respiratory effort normal. Cardiovascular system: S1 & S2 heard, RRR. No JVD, murmurs, rubs, gallops or clicks.  No pedal edema. Gastrointestinal system: Abdomen is nondistended, soft and nontender. No organomegaly or masses felt. Normal bowel sounds heard. Central nervous system: Alert and oriented. No focal neurological deficits. Extremities: Symmetric 5 x 5 power. Skin: No rashes, lesions or ulcers Psychiatry: Judgement and insight appear normal. Mood & affect appropriate.     Data Reviewed: I have personally reviewed following labs  and imaging studies  CBC: Recent Labs  Lab 10/28/20 0153 11/02/20 1628 11/02/20 1654  WBC 5.8 6.1  --   NEUTROABS 2.9 3.6  --   HGB 10.0* 9.7* 9.9*  HCT 31.1* 31.5* 29.0*  MCV 96.3 102.3*  --   PLT 297 243  --    Basic Metabolic Panel: Recent Labs  Lab 10/28/20 0153 11/02/20 1654 11/02/20 1823  NA 136 137 138  K 4.6 8.2* 4.9  CL 104 109 107  CO2 25  --  24  GLUCOSE 113* 24* 86  BUN 28* 38* 23*  CREATININE 1.27* 1.30* 1.25*  CALCIUM 9.5  --  9.3  MG 2.0  --   --   PHOS 4.1  --   --    GFR: Estimated Creatinine Clearance: 33 mL/min (A) (by C-G formula based on SCr of 1.25 mg/dL (H)). Liver Function Tests: Recent Labs  Lab 10/28/20 0153  AST 23  ALT 21  ALKPHOS 56  BILITOT 0.3  PROT 6.2*  ALBUMIN 3.0*   No results for input(s): LIPASE, AMYLASE in the last 168 hours. No results for input(s): AMMONIA in the last 168 hours. Coagulation Profile: No results for input(s): INR, PROTIME in the last 168 hours. Cardiac Enzymes: No results for input(s): CKTOTAL, CKMB, CKMBINDEX, TROPONINI in the last 168 hours. BNP (last 3 results) No results for input(s): PROBNP in the last 8760 hours. HbA1C: No results for input(s): HGBA1C in the last 72 hours. CBG: Recent Labs  Lab 11/03/20 0818 11/03/20 1032 11/03/20 1156 11/03/20 1612 11/03/20 1759  GLUCAP 239* 276* 316* 450* 372*   Lipid Profile: No results for input(s): CHOL, HDL, LDLCALC, TRIG, CHOLHDL, LDLDIRECT in the last 72 hours. Thyroid Function Tests: No results for input(s): TSH, T4TOTAL, FREET4, T3FREE, THYROIDAB in the last 72 hours. Anemia Panel: No results for input(s): VITAMINB12, FOLATE, FERRITIN, TIBC, IRON, RETICCTPCT in the last 72 hours. Sepsis Labs: No results for input(s): PROCALCITON, LATICACIDVEN in the last 168 hours.  Recent Results (from the past 240 hour(s))  Culture, blood (routine x 2)     Status: None   Collection Time: 10/24/20  9:00 PM   Specimen: BLOOD  Result Value Ref Range  Status   Specimen Description BLOOD RIGHT ARM  Final   Special Requests   Final    BOTTLES DRAWN AEROBIC AND ANAEROBIC Blood Culture results may not be optimal due to an inadequate volume of blood received in culture bottles   Culture   Final    NO GROWTH 5 DAYS Performed at [facility], [address] [place], [place] [address]    Report Status 10/29/2020 FINAL  Final  Culture, blood (routine x 2)     Status: None   Collection Time: 10/24/20  9:18 PM   Specimen: BLOOD  Result Value Ref Range Status   Specimen Description BLOOD LEFT HAND  Final   Special Requests   Final    BOTTLES DRAWN AEROBIC ONLY Blood Culture results may not be optimal due to an inadequate volume of blood received in culture bottles   Culture   Final  NO GROWTH 5 DAYS Performed at [facility], [address], [place], [place] [address]    Report Status 10/29/2020 FINAL  Final  SARS CORONAVIRUS 2 (TAT 6-24 HRS) Nasopharyngeal Nasopharyngeal Swab     Status: None   Collection Time: 11/02/20  9:13 PM   Specimen: Nasopharyngeal Swab  Result Value Ref Range Status   SARS Coronavirus 2 NEGATIVE NEGATIVE Final    Comment: (NOTE) SARS-CoV-2 target nucleic acids are NOT DETECTED.  The SARS-CoV-2 RNA is generally detectable in upper and lower respiratory specimens during the acute phase of infection. Negative results do not preclude SARS-CoV-2 infection, do not rule out co-infections with other pathogens, and should not be used as the sole basis for treatment or other patient management decisions. Negative results must be combined with clinical observations, patient history, and epidemiological information. The expected result is Negative.  Fact Sheet for Patients: HairSlick.no  Fact Sheet for Healthcare Providers: quierodirigir.com  This test is not yet approved or cleared by the [place] FDA and  has been authorized for detection  and/or diagnosis of SARS-CoV-2 by FDA under an Emergency Use Authorization (EUA). This EUA will remain  in effect (meaning this test can be used) for the duration of the COVID-19 declaration under Se ction 564(b)(1) of the Act, 21 U.S.C. section 360bbb-3(b)(1), unless the authorization is terminated or revoked sooner.  Performed at [facility], [address], [place], [place] [address]          Radiology Studies: No results found.      Scheduled Meds:  aspirin EC  81 mg Oral Daily   atorvastatin  40 mg Oral QPM   clopidogrel  75 mg Oral Daily   dronabinol  5 mg Oral BID AC   enoxaparin (LOVENOX) injection  20 mg Subcutaneous Q24H   feeding supplement  237 mL Oral BID BM   insulin aspart  0-6 Units Subcutaneous TID WC   [START ON 11/04/2020] insulin aspart  10 Units Subcutaneous TID WC   insulin glargine  8 Units Subcutaneous Daily   ivabradine  2.5 mg Oral BID WC   mirtazapine  15 mg Oral QHS   multivitamin with minerals  1 tablet Oral Daily   Continuous Infusions:   LOS: 0 days    Time spent:    Erick Blinks, MD [facility]   If 7PM-7AM, please contact night-coverage [email or web address]  11/03/2020, 7:55 PM

## 2020-11-03 NOTE — Progress Notes (Signed)
Inpatient Diabetes Program Recommendations  AACE/ADA: New Consensus Statement on Inpatient Glycemic Control (2015)  Target Ranges:  Prepandial:   less than 140 mg/dL      Peak postprandial:   less than 180 mg/dL (1-2 hours)      Critically ill patients:  140 - 180 mg/dL   Results for Christine Benitez, Christine Benitez (MRN [id number]) as of 11/03/2020 09:54  Ref. Range 11/02/2020 16:07 11/02/2020 16:41 11/02/2020 18:03 11/02/2020 19:30 11/02/2020 20:46 11/02/2020 21:26 11/02/2020 22:52  Glucose-Capillary Latest Ref Range: 70 - 99 mg/dL 60 (L) 16 (LL)  50 ml Q94% 64 (L) 77 95 100 (H) 167 (H)   Results for Christine Benitez, Christine Benitez (MRN [id number]) as of 11/03/2020 09:54  Ref. Range 11/03/2020 02:13 11/03/2020 04:53 11/03/2020 06:28  Glucose-Capillary Latest Ref Range: 70 - 99 mg/dL 465 (H) 681 (H) 275 (H)     Admit Hypoglycemia (glucose less than 15)  History: DM, CHF, CKD  Home DM Meds: Lantus 24 units Daily      Novolog 10 units TID  No Insulin Orders yet Checking CBGs Q2 hours   "Per chart review, patient was recently admitted 10/24/2020 and discharged on 10/28/2020.  The dose of her home Lantus was increased from 18 to 24 units daily she was also prescribed NovoLog 10 units with meals.  Patient was noted to have poor appetite, anorexia, cachexia (BMI 16.14), failure to thrive felt to be due to her chronic systolic CHF and was referred to palliative care.  She was also treated for a UTI."   MD- Note CBGs on the rise since 2am today  May consider adding back weight based dose of Lantus and Novolog SSI:  Please consider:  1. Start Lantus 8 units Daily (0.2 units/kg)--please start this AM  2. Start Novolog 0-6 units SSI TID (Very Sensitive scale)     --Will follow patient during hospitalization--  Ambrose Finland RN, MSN, CDE Diabetes Coordinator Inpatient Glycemic Control Team Team Pager: [phone] (8a-5p)

## 2020-11-04 LAB — CBC
HCT: 29.3 % — ABNORMAL LOW (ref 36.0–46.0)
Hemoglobin: 9.3 g/dL — ABNORMAL LOW (ref 12.0–15.0)
MCH: 31.1 pg (ref 26.0–34.0)
MCHC: 31.7 g/dL (ref 30.0–36.0)
MCV: 98 fL (ref 80.0–100.0)
Platelets: 267 10*3/uL (ref 150–400)
RBC: 2.99 MIL/uL — ABNORMAL LOW (ref 3.87–5.11)
RDW: 13.2 % (ref 11.5–15.5)
WBC: 6.4 10*3/uL (ref 4.0–10.5)
nRBC: 0 % (ref 0.0–0.2)

## 2020-11-04 LAB — COMPREHENSIVE METABOLIC PANEL
ALT: 40 U/L (ref 0–44)
AST: 37 U/L (ref 15–41)
Albumin: 2.8 g/dL — ABNORMAL LOW (ref 3.5–5.0)
Alkaline Phosphatase: 54 U/L (ref 38–126)
Anion gap: 11 (ref 5–15)
BUN: 25 mg/dL — ABNORMAL HIGH (ref 6–20)
CO2: 23 mmol/L (ref 22–32)
Calcium: 9.4 mg/dL (ref 8.9–10.3)
Chloride: 104 mmol/L (ref 98–111)
Creatinine, Ser: 1.31 mg/dL — ABNORMAL HIGH (ref 0.44–1.00)
GFR, Estimated: 47 mL/min — ABNORMAL LOW (ref >60)
Glucose, Bld: 144 mg/dL — ABNORMAL HIGH (ref 70–99)
Potassium: 4.6 mmol/L (ref 3.5–5.1)
Sodium: 138 mmol/L (ref 135–145)
Total Bilirubin: 0.3 mg/dL (ref 0.3–1.2)
Total Protein: 5.7 g/dL — ABNORMAL LOW (ref 6.5–8.1)

## 2020-11-04 LAB — GLUCOSE, CAPILLARY
Glucose-Capillary: 284 mg/dL — ABNORMAL HIGH (ref 70–99)
Glucose-Capillary: 130 mg/dL — ABNORMAL HIGH (ref 70–99)
Glucose-Capillary: 194 mg/dL — ABNORMAL HIGH (ref 70–99)
Glucose-Capillary: 286 mg/dL — ABNORMAL HIGH (ref 70–99)

## 2020-11-04 MED ORDER — LACTATED RINGERS IV SOLN: INTRAVENOUS | Status: AC

## 2020-11-04 MED ORDER — LACTATED RINGERS IV BOLUS
500.0000 mL | Freq: Once | INTRAVENOUS | Status: AC
  Administered 2020-11-04: 500 mL via INTRAVENOUS

## 2020-11-04 MED ORDER — MIDODRINE HCL 5 MG PO TABS
5.0000 mg | ORAL_TABLET | Freq: Once | ORAL | Status: AC
  Administered 2020-11-04: 5 mg via ORAL
  Filled 2020-11-04: qty 1

## 2020-11-04 NOTE — Progress Notes (Signed)
   11/04/20 0049  Vitals  BP (!) 87/56  MAP (mmHg) 65  BP Location Left Arm  BP Method Automatic  Patient Position (if appropriate) Lying  Pulse Rate 67  Pulse Rate Source Monitor  ECG Heart Rate 67  Resp 16  MEWS COLOR  MEWS Score Color Green  Oxygen Therapy  SpO2 100 %  O2 Device Room Air  MEWS Score  MEWS Temp 0  MEWS Systolic 1  MEWS Pulse 0  MEWS RR 0  MEWS LOC 0  MEWS Score 1  Patient awake alert and orin. Skin warm and dry. With no complaints. Text paged Dr. Robb Matar. About vital signs.

## 2020-11-04 NOTE — Progress Notes (Signed)
   11/03/20 2332  Vitals  BP (!) 74/64  MAP (mmHg) 70  BP Location Right Arm  BP Method Automatic  Patient Position (if appropriate) Lying  Pulse Rate 64  Pulse Rate Source Monitor  ECG Heart Rate 68  Resp 16  Level of Consciousness  Level of Consciousness Alert  MEWS COLOR  MEWS Score Color Yellow  MEWS Score  MEWS Temp 0  MEWS Systolic 2  MEWS Pulse 0  MEWS RR 0  MEWS LOC 0  MEWS Score 2  Patient is awake alert and orin. No complaints voiced skin warm and dry. Reden R.N. aware Called Dr Robb Matar. See orders also did B.P. in left arm .

## 2020-11-04 NOTE — Progress Notes (Signed)
   11/03/20 2332 11/03/20 2333  Vitals  BP (!) 74/64 (!) 71/42  MAP (mmHg)  --  (!) 52  BP Location  --  Left Arm  BP Method  --  Automatic  Pulse Rate 64 65  Pulse Rate Source  --  Monitor  ECG Heart Rate 68 65  Level of Consciousness  Level of Consciousness Alert Alert  MEWS COLOR  MEWS Score Color Yellow Yellow  Oxygen Therapy  SpO2  --  97 %  O2 Device  --  Room Air  Pain Assessment  Pain Scale  --  0-10  Pain Score  --  0  MEWS Score  MEWS Temp 0 0  MEWS Systolic 2 2  MEWS Pulse 0 0  MEWS RR 0 0  MEWS LOC 0 0  MEWS Score 2 2  Provider Notification  Provider Name/Title  --  ORTIZ  Date Provider Notified  --  11/03/20  Time Provider Notified  --  2338  Notification Type  --  Call  Notification Reason  --  Change in status  Provider response  --  See new orders  Date of Provider Response  --  11/03/20  Time of Provider Response  --  2338  Rechecked B.P, in left arm 23:33 11-03-20

## 2020-11-04 NOTE — Evaluation (Signed)
Physical Therapy Evaluation Patient Details Name: Christine Benitez MRN: [id number] DOB: 1960-10-13 Today's Date: 11/04/2020   History of Present Illness  Pt is a 60 y/o female admitted 6/26 secondary to hypoglycemia and unresponsiveness. PMH includes DM, CHF, CKD, cardiomyopathy, HTN, CVA,  Clinical Impression  Pt admitted secondary to problem above with deficits below. Pt requiring min guard A for mobility tasks using RW. Noted L inattention, but likely has that at baseline from previous CVA. Pt reports she lives with her daughter who can assist as necessary. Recommending HHPT at d/c to increase independence and safety. Will continue to follow acutely.     Follow Up Recommendations Home health PT    Equipment Recommendations  None recommended by PT    Recommendations for Other Services       Precautions / Restrictions Precautions Precautions: Fall Precaution Comments: L inattention, likely baseline from previous CVA Restrictions Weight Bearing Restrictions: No      Mobility  Bed Mobility Overal bed mobility: Needs Assistance Bed Mobility: Supine to Sit;Sit to Supine     Supine to sit: Min assist Sit to supine: Supervision   General bed mobility comments: Min A For trunk elevation    Transfers Overall transfer level: Needs assistance Equipment used: Rolling walker (2 wheeled) Transfers: Sit to/from Stand Sit to Stand: Min guard         General transfer comment: Min guard for safety  Ambulation/Gait Ambulation/Gait assistance: Min guard Gait Distance (Feet): 40 Feet Assistive device: Rolling walker (2 wheeled) Gait Pattern/deviations: Step-through pattern Gait velocity: reduced   General Gait Details: Pt reports feeling "weak" during mobility. min guard for safety. Demonstrated L inattention during mobility but likely baseline  Stairs            Wheelchair Mobility    Modified Rankin (Stroke Patients Only)       Balance Overall balance  assessment: Needs assistance Sitting-balance support: No upper extremity supported;Feet supported Sitting balance-Leahy Scale: Good     Standing balance support: Bilateral upper extremity supported Standing balance-Leahy Scale: Poor Standing balance comment: Reliant on BUE support                             Pertinent Vitals/Pain Pain Assessment: No/denies pain    Home Living Family/patient expects to be discharged to:: Private residence Living Arrangements: Children Available Help at Discharge: Family;Available 24 hours/day Type of Home: House Home Access: Ramped entrance     Home Layout: One level Home Equipment: Walker - 2 wheels      Prior Function Level of Independence: Independent with assistive device(s)         Comments: pt reports ambulating independently with use of RW     Hand Dominance        Extremity/Trunk Assessment   Upper Extremity Assessment Upper Extremity Assessment: Defer to OT evaluation    Lower Extremity Assessment Lower Extremity Assessment: LLE deficits/detail LLE Deficits / Details: LLE weakness at baseline secondary to CVA    Cervical / Trunk Assessment Cervical / Trunk Assessment: Kyphotic  Communication   Communication: HOH  Cognition Arousal/Alertness: Awake/alert Behavior During Therapy: WFL for tasks assessed/performed Overall Cognitive Status: No family/caregiver present to determine baseline cognitive functioning                                 General Comments: Likely at baseline      General  Comments General comments (skin integrity, edema, etc.): VSS    Exercises     Assessment/Plan    PT Assessment Patient needs continued PT services  PT Problem List Decreased strength;Decreased activity tolerance;Decreased balance;Decreased mobility       PT Treatment Interventions DME instruction;Gait training;Functional mobility training;Therapeutic activities;Therapeutic exercise;Balance  training;Patient/family education    PT Goals (Current goals can be found in the Care Plan section)  Acute Rehab PT Goals Patient Stated Goal: To return home. PT Goal Formulation: With patient Time For Goal Achievement: 11/18/20 Potential to Achieve Goals: Good    Frequency Min 3X/week   Barriers to discharge        Co-evaluation               AM-PAC PT "6 Clicks" Mobility  Outcome Measure Help needed turning from your back to your side while in a flat bed without using bedrails?: None Help needed moving from lying on your back to sitting on the side of a flat bed without using bedrails?: A Little Help needed moving to and from a bed to a chair (including a wheelchair)?: A Little Help needed standing up from a chair using your arms (e.g., wheelchair or bedside chair)?: A Little Help needed to walk in hospital room?: A Little Help needed climbing 3-5 steps with a railing? : A Lot 6 Click Score: 18    End of Session Equipment Utilized During Treatment: Gait belt Activity Tolerance: Patient tolerated treatment well Patient left: in chair;with call bell/phone within reach;with bed alarm set Nurse Communication: Mobility status PT Visit Diagnosis: Other abnormalities of gait and mobility (R26.89)    Time: 1749-4496 PT Time Calculation (min) (ACUTE ONLY): 18 min   Charges:   PT Evaluation $PT Eval Low Complexity: 1 Low          Cindee Salt, DPT  Acute Rehabilitation Services  Pager: [phone] Office: [phone]   Lehman Prom 11/04/2020, 3:53 PM

## 2020-11-04 NOTE — Progress Notes (Signed)
   11/04/20 0016  Vitals  BP (!) 94/55 (after bolus)  MAP (mmHg) 68  BP Location Left Arm  BP Method Automatic  Patient Position (if appropriate) Lying  Pulse Rate 70  Pulse Rate Source Monitor  ECG Heart Rate 71  Resp 16  Level of Consciousness  Level of Consciousness Alert  MEWS COLOR  MEWS Score Color Green  Oxygen Therapy  SpO2 100 %  O2 Device Room Air  MEWS Score  MEWS Temp 0  MEWS Systolic 1  MEWS Pulse 0  MEWS RR 0  MEWS LOC 0  MEWS Score 1  Provider Notification  Provider Name/Title ortiz  Date Provider Notified 11/04/20  Time Provider Notified [phone]  Notification Type Page (text paged)  V.S. after 500 ml fl. Bolus . Dr. Robb Matar was text paged V.S.

## 2020-11-04 NOTE — Progress Notes (Signed)
   11/04/20 0218  Vitals  Temp 98.1 F (36.7 C)  Temp Source Oral  BP (!) 90/53 (after fl bolus in)  MAP (mmHg) 65  BP Location Left Arm  BP Method Automatic  Patient Position (if appropriate) Lying  Pulse Rate 67  Pulse Rate Source Monitor  ECG Heart Rate 66  Resp 16  Level of Consciousness  Level of Consciousness Alert  MEWS COLOR  MEWS Score Color Green  Oxygen Therapy  SpO2 100 %  O2 Device Room Air  Pain Assessment  Pain Scale 0-10  Pain Score 0  MEWS Score  MEWS Temp 0  MEWS Systolic 1  MEWS Pulse 0  MEWS RR 0  MEWS LOC 0  MEWS Score 1  V.S. after 2nd fl. Bolus text paged Dr. Robb Matar

## 2020-11-04 NOTE — TOC Initial Note (Signed)
Transition of Care (TOC) - Initial/Assessment Note  Sander Radon, BSN Transitions of Care Unit 4E- RN Case Manager See Treatment Team for direct phone #    Patient Details  Name: Christine Benitez MRN: [id number] Date of Birth: 1960/11/09  Transition of Care [facility]) CM/SW Contact:    Darrold Span, RN Phone Number: 11/04/2020, 2:35 PM  Clinical Narrative:                 Pt admitted from home, pt reports she lives with daughter, also has mother and brother nearby that comes and assist.  Pt states that we will need to call her mother to see if her brother can assist with transport home otherwise pt will need assistance with transport home- pt reports she was assisted on last admit and went home in a taxi.   MD placed order for [facility] for assistance in DM disease management at home to help reduce risk of further re-admits. Spoke with pt about this and she is agreeable and understands they will not come daily but will come to check on her. Reached out to [employer] with [facility] agency this week) to make referral for [facility] need.  1400- received msg back from [facility] that [facility] will be able to accept pt for [facility] need- and she will come speak with pt to see if pt meets charity guidelines for services.   Pt was set up with [facility] for primary care on last admit- has upcoming appointment for July 29 at 11:00am  [facility] will follow for further transition needs.   Expected Discharge Plan: Home w Home Health Services Barriers to Discharge: Continued Medical Work up   Patient Goals and CMS Choice Patient states their goals for this hospitalization and ongoing recovery are:: return home   Choice offered to / list presented to : NA (charity referral- pt un-insured)  Expected Discharge Plan and Services Expected Discharge Plan: Home w Home Health Services   Discharge Planning Services: [facility] Choice: Home Health Living arrangements for  the past 2 months: Single Family Home                           HH Arranged: RN, Disease Management HH Agency: [facility] Date [facility] Agency Contacted: 11/04/20 Time HH Agency Contacted: 1230 Representative spoke with at [facility] Agency: Stacie  Prior Living Arrangements/Services Living arrangements for the past 2 months: Single Family Home Lives with:: Self Patient language and need for interpreter reviewed:: Yes Do you feel safe going back to the place where you live?: Yes      Need for Family Participation in Patient Care: Yes (Comment) Care giver support system in place?: Yes (comment) Current home services: DME Criminal Activity/Legal Involvement Pertinent to Current Situation/Hospitalization: No - Comment as needed  Activities of Daily Living Home Assistive Devices/Equipment: Dan Humphreys (specify type) ADL Screening (condition at time of admission) Patient's cognitive ability adequate to safely complete daily activities?: Yes Is the patient deaf or have difficulty hearing?: Yes Does the patient have difficulty seeing, even when wearing glasses/contacts?: No Does the patient have difficulty concentrating, remembering, or making decisions?: No Patient able to express need for assistance with ADLs?: Yes Does the patient have difficulty dressing or bathing?: Yes Independently performs ADLs?: No Communication: Independent Dressing (OT): Needs assistance Is this a change from baseline?: Pre-admission baseline Grooming: Needs assistance Is this a change from baseline?: Pre-admission baseline Feeding: Independent  Bathing: Needs assistance Is this a change from baseline?: Pre-admission baseline Toileting: Needs assistance Is this a change from baseline?: Pre-admission baseline In/Out Bed: Needs assistance Is this a change from baseline?: Change from baseline, expected to last >3 days Walks in Home: Needs assistance Is this a change from baseline?: Change from baseline,  expected to last >3 days Does the patient have difficulty walking or climbing stairs?: Yes Weakness of Legs: Both Weakness of Arms/Hands: Both  Permission Sought/Granted Permission sought to share information with : Facility Industrial/product designer granted to share information with : Yes, Verbal Permission Granted  Share Information with NAME: [name]  Permission granted to share info w AGENCY: HH  Permission granted to share info w Relationship: mother  Permission granted to share info w Contact Information: [phone]  Emotional Assessment Appearance:: Appears older than stated age Attitude/Demeanor/Rapport: Engaged Affect (typically observed): Appropriate Orientation: : Oriented to Self, Oriented to Place, Oriented to  Time, Oriented to Situation Alcohol / Substance Use: Not Applicable Psych Involvement: No (comment)  Admission diagnosis:  Hypoglycemia [E16.2] Patient Active Problem List   Diagnosis Date Noted   Constipation 11/03/2020   Cachexia (HCC) 11/03/2020   Malnutrition of moderate degree 11/03/2020   Hypoglycemia 11/02/2020   Proteus mirabilis infection 10/25/2020   Acute lower UTI 10/24/2020   Urinary tract infection 10/24/2020   Failure to thrive in adult 10/24/2020   Pericardial effusion    Iron deficiency anemia    Chronic systolic CHF (congestive heart failure) (HCC)    Heart failure with reduced ejection fraction (HCC) 07/18/2020   History of CVA (cerebrovascular accident) 07/18/2020   Nonischemic cardiomyopathy (HCC) 07/18/2020   Type 2 diabetes mellitus with complication, with long-term current use of insulin (HCC) 07/18/2020   Tobacco use 07/18/2020   Mixed hyperlipidemia 07/18/2020   Abnormal echocardiogram 07/18/2020   Resides in long term care facility 06/20/2020   Protein-calorie malnutrition, severe 03/24/2020   Pain    Altered mental status    DNR (do not resuscitate)    DNI (do not intubate)    Encounter for hospice care  discussion    Goals of care, counseling/discussion    Palliative care by specialist    AKI (acute kidney injury) (HCC)    Cardiogenic shock (HCC) 03/14/2020   Acute urinary retention 03/13/2020   Chronic cholecystitis with calculus 03/12/2020   Cerebral thrombosis with cerebral infarction 03/12/2020   Abnormal LFTs    Hypertension    NICM (nonischemic cardiomyopathy) (HCC)    Pulmonary hypertension (HCC)    Tobacco abuse 06/10/2014   Diabetes mellitus (HCC) 06/10/2014   Family history of premature CAD 06/10/2014   History of blood transfusion 1986   PCP:  Lupita Dawn, MD Pharmacy:   [phone], [place] - [address] AT [facility] [address] [place] [address] Phone: [phone] Fax: [phone]  [facility] [address] [place] [address] Phone: [phone] Fax: [phone]     Social Determinants of Health (SDOH) Interventions    Readmission Risk Interventions No flowsheet data found.

## 2020-11-04 NOTE — Progress Notes (Signed)
PROGRESS NOTE    Christine Benitez  WGY:[id number] DOB: [birth year] DOA: 11/02/2020 PCP: Christine Dawn, MD    Brief Narrative:  60 year old female with brittle diabetes, cardiomyopathy, chronic kidney disease, admitted to the hospital with hypoglycemia and acute metabolic encephalopathy.  Recent admission for urinary tract infection which she was discharged on 6/21.  Insulin doses were adjusted at that time.  Since her admission, her insulin has been held and blood sugars have since improved.  Lantus and NovoLog have been restarted at reduced doses.  Continue to monitor for stability of blood sugars.   Assessment & Plan:   Principal Problem:   Hypoglycemia Active Problems:   Type 2 diabetes mellitus with complication, with long-term current use of insulin (HCC)   Chronic systolic CHF (congestive heart failure) (HCC)   Constipation   Cachexia (HCC)   Malnutrition of moderate degree   Severe hypoglycemia with in the setting of type 2 diabetes, insulin-dependent -Patient has brittle insulin-dependent diabetes  -She takes both Lantus and NovoLog at home -She had a recent hospitalization reviewed doses were adjusted/increased -Patient reports compliance with medications and ensure she did not take any extra medications -She does admit that she does not eat on a regular schedule, mostly eats breakfast and then snacks through the day -The patient administers her own medications -With her blood sugars rebounded rather quickly, I suspect there may be an issue with administration of medications. -Patient is currently living at home with her daughter -May need to have increased home health services for more education/oversight -Blood sugars running high yesterday, but seem to be improving today -continue to monitor  Hypotension -noted to have systolic blood pressures in the 70-80s overnight -responded to IV fluids -does not appear septic or toxic -likely volume depletion -continue IV fluids  for today -follow up labs  Chronic systolic congestive heart failure -Recent EF in 10/2018 showed improvement of EF to 50 to 55%. -Currently, volume status appears compensated  History of CVA -Continue aspirin, Plavix and statin  Chronic kidney disease stage IIIa -Creatinine is currently at baseline  Anorexia/cachexia -Continue Marinol -Nutrition following   DVT prophylaxis:   Lovenox  Code Status: DNR Family Communication: discussed in detail with patient's mother. Patient's daughter is currently admitted in the hospital and cannot participate in patient's care. The remainder of her family (mother, brothers) are trying their best to frequently check on her. Mom admits that Christine Benitez often does not eat regularly which can lead to low sugars. Her mother tries to prefill her insulin syringes so that [name] does not have to draw out her own insulin. I think home health (if it can be arranged) would be helpful. Disposition Plan: Status is: Inpatient  Remains inpatient appropriate because:Persistent severe electrolyte disturbances and Ongoing diagnostic testing needed not appropriate for outpatient work up  Dispo: The patient is from: Home              Anticipated d/c is to: Home              Patient currently is not medically stable to d/c.   Difficult to place patient No         Consultants:    Procedures:    Antimicrobials:      Subjective: Patient denies any dizziness, lightheadedness. She does feel generally weak  Objective: Vitals:   11/04/20 0351 11/04/20 0511 11/04/20 0747 11/04/20 1123  BP: (!) 101/56 98/60 (!) 105/58 96/60  Pulse: 60 (!) 58 62 63  Resp: 17 16 17  18  Temp: 98.4 F (36.9 C)  98.1 F (36.7 C)   TempSrc: Oral  Oral Oral  SpO2: 100% 100% 100% 100%  Weight: 47.7 kg     Height:        Intake/Output Summary (Last 24 hours) at 11/04/2020 1206 Last data filed at 11/03/2020 2143 Gross per 24 hour  Intake 480 ml  Output 1050 ml  Net -570 ml    Filed Weights   11/02/20 1619 11/02/20 2248 11/04/20 0351  Weight: 42.6 kg 42.6 kg 47.7 kg    Examination:  General exam: Alert, awake, oriented x 3 Respiratory system: Clear to auscultation. Respiratory effort normal. Cardiovascular system:RRR. No murmurs, rubs, gallops. Gastrointestinal system: Abdomen is nondistended, soft and nontender. No organomegaly or masses felt. Normal bowel sounds heard. Central nervous system: Alert and oriented. No focal neurological deficits. Extremities: No C/C/E, +pedal pulses Skin: No rashes, lesions or ulcers Psychiatry: Judgement and insight appear normal. Mood & affect appropriate.    Data Reviewed: I have personally reviewed following labs and imaging studies  CBC: Recent Labs  Lab 11/02/20 1628 11/02/20 1654  WBC 6.1  --   NEUTROABS 3.6  --   HGB 9.7* 9.9*  HCT 31.5* 29.0*  MCV 102.3*  --   PLT 243  --    Basic Metabolic Panel: Recent Labs  Lab 11/02/20 1654 11/02/20 1823  NA 137 138  K 8.2* 4.9  CL 109 107  CO2  --  24  GLUCOSE 24* 86  BUN 38* 23*  CREATININE 1.30* 1.25*  CALCIUM  --  9.3   GFR: Estimated Creatinine Clearance: 36.9 mL/min (A) (by C-G formula based on SCr of 1.25 mg/dL (H)). Liver Function Tests: No results for input(s): AST, ALT, ALKPHOS, BILITOT, PROT, ALBUMIN in the last 168 hours.  No results for input(s): LIPASE, AMYLASE in the last 168 hours. No results for input(s): AMMONIA in the last 168 hours. Coagulation Profile: No results for input(s): INR, PROTIME in the last 168 hours. Cardiac Enzymes: No results for input(s): CKTOTAL, CKMB, CKMBINDEX, TROPONINI in the last 168 hours. BNP (last 3 results) No results for input(s): PROBNP in the last 8760 hours. HbA1C: No results for input(s): HGBA1C in the last 72 hours. CBG: Recent Labs  Lab 11/03/20 1612 11/03/20 1759 11/03/20 2045 11/04/20 0621 11/04/20 1124  GLUCAP 450* 372* 120* 284* 130*   Lipid Profile: No results for input(s): CHOL,  HDL, LDLCALC, TRIG, CHOLHDL, LDLDIRECT in the last 72 hours. Thyroid Function Tests: No results for input(s): TSH, T4TOTAL, FREET4, T3FREE, THYROIDAB in the last 72 hours. Anemia Panel: No results for input(s): VITAMINB12, FOLATE, FERRITIN, TIBC, IRON, RETICCTPCT in the last 72 hours. Sepsis Labs: No results for input(s): PROCALCITON, LATICACIDVEN in the last 168 hours.  Recent Results (from the past 240 hour(s))  SARS CORONAVIRUS 2 (TAT 6-24 HRS) Nasopharyngeal Nasopharyngeal Swab     Status: None   Collection Time: 11/02/20  9:13 PM   Specimen: Nasopharyngeal Swab  Result Value Ref Range Status   SARS Coronavirus 2 NEGATIVE NEGATIVE Final    Comment: (NOTE) SARS-CoV-2 target nucleic acids are NOT DETECTED.  The SARS-CoV-2 RNA is generally detectable in upper and lower respiratory specimens during the acute phase of infection. Negative results do not preclude SARS-CoV-2 infection, do not rule out co-infections with other pathogens, and should not be used as the sole basis for treatment or other patient management decisions. Negative results must be combined with clinical observations, patient history, and epidemiological information. The expected  result is Negative.  Fact Sheet for Patients: HairSlick.no  Fact Sheet for Healthcare Providers: quierodirigir.com  This test is not yet approved or cleared by the [place] FDA and  has been authorized for detection and/or diagnosis of SARS-CoV-2 by FDA under an Emergency Use Authorization (EUA). This EUA will remain  in effect (meaning this test can be used) for the duration of the COVID-19 declaration under Se ction 564(b)(1) of the Act, 21 U.S.C. section 360bbb-3(b)(1), unless the authorization is terminated or revoked sooner.  Performed at [facility], [address], [place], [place] [address]          Radiology Studies: No results  found.      Scheduled Meds:  aspirin EC  81 mg Oral Daily   atorvastatin  40 mg Oral QPM   clopidogrel  75 mg Oral Daily   dronabinol  5 mg Oral BID AC   enoxaparin (LOVENOX) injection  20 mg Subcutaneous Q24H   feeding supplement  237 mL Oral BID BM   insulin aspart  0-6 Units Subcutaneous TID WC   insulin aspart  10 Units Subcutaneous TID WC   insulin glargine  8 Units Subcutaneous Daily   ivabradine  2.5 mg Oral BID WC   mirtazapine  15 mg Oral QHS   multivitamin with minerals  1 tablet Oral Daily   Continuous Infusions:  lactated ringers 50 mL/hr at 11/04/20 0315     LOS: 1 day    Time spent:    Erick Blinks, MD [facility]   If 7PM-7AM, please contact night-coverage [email or web address]  11/04/2020, 12:06 PM

## 2020-11-05 DIAGNOSIS — I5022 Chronic systolic (congestive) heart failure: Secondary | ICD-10-CM

## 2020-11-05 LAB — COMPREHENSIVE METABOLIC PANEL
ALT: 36 U/L (ref 0–44)
AST: 30 U/L (ref 15–41)
Albumin: 2.8 g/dL — ABNORMAL LOW (ref 3.5–5.0)
Alkaline Phosphatase: 47 U/L (ref 38–126)
Anion gap: 6 (ref 5–15)
BUN: 26 mg/dL — ABNORMAL HIGH (ref 6–20)
CO2: 23 mmol/L (ref 22–32)
Calcium: 9.2 mg/dL (ref 8.9–10.3)
Chloride: 107 mmol/L (ref 98–111)
Creatinine, Ser: 1.37 mg/dL — ABNORMAL HIGH (ref 0.44–1.00)
GFR, Estimated: 45 mL/min — ABNORMAL LOW (ref >60)
Glucose, Bld: 277 mg/dL — ABNORMAL HIGH (ref 70–99)
Potassium: 5.8 mmol/L — ABNORMAL HIGH (ref 3.5–5.1)
Sodium: 136 mmol/L (ref 135–145)
Total Bilirubin: 0.2 mg/dL — ABNORMAL LOW (ref 0.3–1.2)
Total Protein: 5.6 g/dL — ABNORMAL LOW (ref 6.5–8.1)

## 2020-11-05 LAB — GLUCOSE, CAPILLARY
Glucose-Capillary: 230 mg/dL — ABNORMAL HIGH (ref 70–99)
Glucose-Capillary: 178 mg/dL — ABNORMAL HIGH (ref 70–99)
Glucose-Capillary: 27 mg/dL — CL (ref 70–99)
Glucose-Capillary: 239 mg/dL — ABNORMAL HIGH (ref 70–99)
Glucose-Capillary: 421 mg/dL — ABNORMAL HIGH (ref 70–99)

## 2020-11-05 LAB — CBC
HCT: 28 % — ABNORMAL LOW (ref 36.0–46.0)
Hemoglobin: 9 g/dL — ABNORMAL LOW (ref 12.0–15.0)
MCH: 31.3 pg (ref 26.0–34.0)
MCHC: 32.1 g/dL (ref 30.0–36.0)
MCV: 97.2 fL (ref 80.0–100.0)
Platelets: 241 10*3/uL (ref 150–400)
RBC: 2.88 MIL/uL — ABNORMAL LOW (ref 3.87–5.11)
RDW: 13.1 % (ref 11.5–15.5)
WBC: 6.8 10*3/uL (ref 4.0–10.5)
nRBC: 0 % (ref 0.0–0.2)

## 2020-11-05 LAB — POTASSIUM: Potassium: 5.2 mmol/L — ABNORMAL HIGH (ref 3.5–5.1)

## 2020-11-05 MED ORDER — INSULIN ASPART 100 UNIT/ML IJ SOLN
0.0000 [IU] | Freq: Every day | INTRAMUSCULAR | Status: DC
  Administered 2020-11-05: 5 [IU] via SUBCUTANEOUS
  Administered 2020-11-06 – 2020-11-10 (×3): 3 [IU] via SUBCUTANEOUS
  Administered 2020-11-12: 4 [IU] via SUBCUTANEOUS

## 2020-11-05 MED ORDER — INSULIN GLARGINE 100 UNIT/ML ~~LOC~~ SOLN
5.0000 [IU] | Freq: Every day | SUBCUTANEOUS | Status: DC
  Administered 2020-11-06: 5 [IU] via SUBCUTANEOUS
  Filled 2020-11-05 (×2): qty 0.05

## 2020-11-05 MED ORDER — DEXTROSE 50 % IV SOLN: 25.0000 g | INTRAVENOUS | Status: AC

## 2020-11-05 MED ORDER — SODIUM ZIRCONIUM CYCLOSILICATE 10 G PO PACK
10.0000 g | PACK | Freq: Two times a day (BID) | ORAL | Status: AC
  Administered 2020-11-05 (×2): 10 g via ORAL
  Filled 2020-11-05 (×2): qty 1

## 2020-11-05 MED ORDER — INSULIN ASPART 100 UNIT/ML IJ SOLN
0.0000 [IU] | Freq: Three times a day (TID) | INTRAMUSCULAR | Status: DC
  Administered 2020-11-06: 3 [IU] via SUBCUTANEOUS
  Administered 2020-11-06 – 2020-11-07 (×2): 9 [IU] via SUBCUTANEOUS
  Administered 2020-11-07: 2 [IU] via SUBCUTANEOUS
  Administered 2020-11-08: 5 [IU] via SUBCUTANEOUS
  Administered 2020-11-08: 3 [IU] via SUBCUTANEOUS
  Administered 2020-11-08 – 2020-11-09 (×2): 7 [IU] via SUBCUTANEOUS
  Administered 2020-11-09: 5 [IU] via SUBCUTANEOUS
  Administered 2020-11-09: 3 [IU] via SUBCUTANEOUS
  Administered 2020-11-10: 2 [IU] via SUBCUTANEOUS
  Administered 2020-11-10: 7 [IU] via SUBCUTANEOUS
  Administered 2020-11-10: 9 [IU] via SUBCUTANEOUS
  Administered 2020-11-11: 5 [IU] via SUBCUTANEOUS
  Administered 2020-11-11 – 2020-11-12 (×2): 3 [IU] via SUBCUTANEOUS
  Administered 2020-11-12: 1 [IU] via SUBCUTANEOUS
  Administered 2020-11-13: 3 [IU] via SUBCUTANEOUS

## 2020-11-05 MED ORDER — DEXTROSE 50 % IV SOLN
INTRAVENOUS | Status: AC
  Filled 2020-11-05: qty 50

## 2020-11-05 MED ORDER — INSULIN GLARGINE 100 UNIT/ML ~~LOC~~ SOLN: 6.0000 [IU] | Freq: Every day | SUBCUTANEOUS | Status: DC

## 2020-11-05 NOTE — Progress Notes (Signed)
[facility] 4E24 [facility]) Hospital Liaison note:  This is a pending outpatient-based Palliative Care patient. Will continue to follow for disposition.  Please call with any outpatient palliative questions or concerns.  Thank you, Abran Cantor, LPN [facility] Liaison [phone]

## 2020-11-05 NOTE — Evaluation (Signed)
Occupational Therapy Evaluation Patient Details Name: Christine Benitez MRN: [id number] DOB: 04-May-1961 Today's Date: 11/05/2020    History of Present Illness Pt is a 60 y/o female admitted 6/26 secondary to hypoglycemia and unresponsiveness. PMH includes DM, CHF, CKD, cardiomyopathy, HTN, CVA,   Clinical Impression   Pt reports walking with a RW and being able to care for herself as her daughter has been in the hospital in [place]. Difficult to accurately assess cognition due to pt being [facility]. She demonstrates decreased visual acuity and inability to read small print. She reports her mother and brother check on her. Will follow acutely, recommending HHOT for home safety assessment and to address safety with IADL.     Follow Up Recommendations  Home health OT    Equipment Recommendations  None recommended by OT    Recommendations for Other Services       Precautions / Restrictions Precautions Precautions: Fall Precaution Comments: impaired vision, likely baseline, unable to see small print Restrictions Weight Bearing Restrictions: No      Mobility Bed Mobility Overal bed mobility: Needs Assistance Bed Mobility: Supine to Sit;Sit to Supine     Supine to sit: Supervision Sit to supine: Supervision   General bed mobility comments: supervision for lines    Transfers Overall transfer level: Needs assistance Equipment used: Rolling walker (2 wheeled) Transfers: Sit to/from Stand Sit to Stand: Min guard         General transfer comment: Min guard for safety    Balance Overall balance assessment: Needs assistance   Sitting balance-Leahy Scale: Good     Standing balance support: Bilateral upper extremity supported Standing balance-Leahy Scale: Poor Standing balance comment: Reliant on BUE support for dynamic balance                           ADL either performed or assessed with clinical judgement   ADL Overall ADL's : Needs  assistance/impaired Eating/Feeding: Minimal assistance;Bed level Eating/Feeding Details (indicate cue type and reason): assist to cut food, set up tray Grooming: Wash/dry hands;Set up;Sitting   Upper Body Bathing: Minimal assistance;Sitting   Lower Body Bathing: Minimal assistance;Sit to/from stand   Upper Body Dressing : Minimal assistance;Sitting   Lower Body Dressing: Minimal assistance;Sit to/from stand   Toilet Transfer: Min guard;Ambulation;RW   Toileting- Architect and Hygiene: Min guard;Sit to/from stand       Functional mobility during ADLs: Min guard;Rolling walker       Vision Patient Visual Report: Other (comment) (decreased visual acuity, unable to read small print) Additional Comments: vision is likely at baseline     Perception     Praxis      Pertinent Vitals/Pain Pain Assessment: No/denies pain     Hand Dominance Right   Extremity/Trunk Assessment Upper Extremity Assessment Upper Extremity Assessment: LUE deficits/detail LUE Deficits / Details: using L UE as lead with eating, hx of residual weakness from previous CVA   Lower Extremity Assessment Lower Extremity Assessment: Defer to PT evaluation   Cervical / Trunk Assessment Cervical / Trunk Assessment: Kyphotic   Communication Communication Communication: HOH   Cognition Arousal/Alertness: Awake/alert Behavior During Therapy: Flat affect Overall Cognitive Status: No family/caregiver present to determine baseline cognitive functioning                                 General Comments: Cognition needs further assessment, no family to confirm  history, pt is a difficult interviewee due to poor hearing.   General Comments  VSS    Exercises     Shoulder Instructions      Home Living Family/patient expects to be discharged to:: Private residence Living Arrangements: Children Available Help at Discharge: Family;Available PRN/intermittently Type of Home:  House Home Access: Ramped entrance     Home Layout: One level     Bathroom Shower/Tub: Chief Strategy Officer: Standard     Home Equipment: Environmental consultant - 2 wheels   Additional Comments: reports her daughter has recently been in the hospital in [place], mother and brother check on pt, no family available to confirm      Prior Functioning/Environment Level of Independence: Independent with assistive device(s)        Comments: Pt reports walking with RW and completing ADL by herself, she manages her own meds        OT Problem List: Decreased cognition;Decreased coordination;Impaired balance (sitting and/or standing)      OT Treatment/Interventions:      OT Goals(Current goals can be found in the care plan section) Acute Rehab OT Goals Patient Stated Goal: To return home. OT Goal Formulation: With patient Time For Goal Achievement: 11/19/20 Potential to Achieve Goals: Good ADL Goals Pt Will Perform Grooming: with modified independence;standing Pt Will Perform Lower Body Bathing: with modified independence;sit to/from stand Pt Will Perform Lower Body Dressing: with modified independence;sit to/from stand Pt Will Transfer to Toilet: with modified independence;ambulating Pt Will Perform Toileting - Clothing Manipulation and hygiene: with modified independence;sit to/from stand Additional ADL Goal #1: Pt will demonstrate ability to manage medications with modifications for vision using pill box test. Additional ADL Goal #2: Pt will verbalize how to respond to emergencies in the home (medical, fire).  OT Frequency:     Barriers to D/C:            Co-evaluation              AM-PAC OT "6 Clicks" Daily Activity     Outcome Measure Help from another person eating meals?: A Little Help from another person taking care of personal grooming?: A Little Help from another person toileting, which includes using toliet, bedpan, or urinal?: A Little Help from  another person bathing (including washing, rinsing, drying)?: A Little Help from another person to put on and taking off regular upper body clothing?: A Little Help from another person to put on and taking off regular lower body clothing?: A Little 6 Click Score: 18   End of Session Equipment Utilized During Treatment: Gait belt;Rolling walker Nurse Communication: Mobility status  Activity Tolerance: Patient tolerated treatment well Patient left: in bed;with call bell/phone within reach;with bed alarm set  OT Visit Diagnosis: Muscle weakness (generalized) (M62.81)                Time: 1829-9371 OT Time Calculation (min): 16 min Charges:  OT General Charges $OT Visit: 1 Visit OT Evaluation $OT Eval Moderate Complexity: 1 Mod  Martie Round, OTR/L Acute Rehabilitation Services Pager: [phone] Office: [phone]  Evern Bio 11/05/2020, 9:58 AM

## 2020-11-05 NOTE — Progress Notes (Signed)
Inpatient Diabetes Program Recommendations  AACE/ADA: New Consensus Statement on Inpatient Glycemic Control (2015)  Target Ranges:  Prepandial:   less than 140 mg/dL      Peak postprandial:   less than 180 mg/dL (1-2 hours)      Critically ill patients:  140 - 180 mg/dL   Results for EVENY, ANASTAS (MRN [id number]) as of 11/05/2020 08:34  Ref. Range 11/04/2020 06:21 11/04/2020 11:24 11/04/2020 16:14 11/04/2020 21:31  Glucose-Capillary Latest Ref Range: 70 - 99 mg/dL 124 (H)  3 units NOVOLOG  10 units NOVOLOG @9 :16am 130 (H)  10 units NOVOLOG  8 units LANTUS @9 :15am  194 (H)  11 units NOVOLOG  286 (H)  Results for AZYIAH, BO (MRN ) as of 11/05/2020 08:34  Ref. Range 11/05/2020 06:25  Glucose-Capillary Latest Ref Range: 70 - 99 mg/dL 11/07/2020 (H)  2 units NOVOLOG     Admit Hypoglycemia (glucose less than 15)   History: DM, CHF, CKD   Home DM Meds: Lantus 24 units Daily      Novolog 10 units TID  Current Orders: Lantus 8 units Daily    Novolog 0-6 units TID     Novolog 10 units TID with meals    "Per chart review, patient was recently admitted 10/24/2020 and discharged on 10/28/2020.  The dose of her home Lantus was increased from 18 to 24 units daily she was also prescribed NovoLog 10 units with meals.  Patient was noted to have poor appetite, anorexia, cachexia (BMI 16.14), failure to thrive felt to be due to her chronic systolic CHF and was referred to palliative care.  She was also treated for a UTI."    MD- Note CBG 230 this AM  Please consider increasing the Lantus slightly to 12 units Daily    --Will follow patient during hospitalization--  10/26/2020 RN, MSN, CDE Diabetes Coordinator Inpatient Glycemic Control Team Team Pager: [phone] (8a-5p)

## 2020-11-05 NOTE — Progress Notes (Signed)
Physical Therapy Treatment Patient Details Name: Christine Benitez MRN: [id number] DOB: 14-Nov-1960 Today's Date: 11/05/2020    History of Present Illness Pt is a 60 y/o female admitted 6/26 secondary to hypoglycemia and unresponsiveness. PMH includes DM, CHF, CKD, cardiomyopathy, HTN, CVA,    PT Comments    Steadily improving toward goals.  Emphasis on self peri care from purewick leakage and progression of transfer safety and gait stability/stamina.    Follow Up Recommendations  Home health PT     Equipment Recommendations  None recommended by PT    Recommendations for Other Services       Precautions / Restrictions Precautions Precautions: Fall Precaution Comments: impaired vision, likely baseline, unable to see small print    Mobility  Bed Mobility Overal bed mobility: Needs Assistance Bed Mobility: Supine to Sit;Sit to Supine     Supine to sit: Supervision Sit to supine: Supervision        Transfers Overall transfer level: Needs assistance Equipment used: Rolling walker (2 wheeled) Transfers: Sit to/from Stand Sit to Stand: Min guard         General transfer comment: Min guard for safety  Ambulation/Gait Ambulation/Gait assistance: Min guard Gait Distance (Feet): 200 Feet Assistive device: Rolling walker (2 wheeled) Gait Pattern/deviations: Step-through pattern Gait velocity: reduced Gait velocity interpretation: <1.31 ft/sec, indicative of household ambulator General Gait Details: very small, slow steps.  Still feeling weak, but improved.   Stairs             Wheelchair Mobility    Modified Rankin (Stroke Patients Only)       Balance   Sitting-balance support: No upper extremity supported;Feet supported Sitting balance-Leahy Scale: Good     Standing balance support: Bilateral upper extremity supported Standing balance-Leahy Scale: Poor Standing balance comment: Reliant on BUE support for dynamic balance                             Cognition Arousal/Alertness: Awake/alert Behavior During Therapy: Flat affect Overall Cognitive Status: No family/caregiver present to determine baseline cognitive functioning                                        Exercises      General Comments General comments (skin integrity, edema, etc.): vss.  Pt went to the bathroom, cleaned herself up from leaking Purewick and stool      Pertinent Vitals/Pain Pain Assessment: No/denies pain Pain Intervention(s): Monitored during session    Home Living                      Prior Function            PT Goals (current goals can now be found in the care plan section) Acute Rehab PT Goals Patient Stated Goal: To return home. PT Goal Formulation: With patient Time For Goal Achievement: 11/18/20 Potential to Achieve Goals: Good Progress towards PT goals: Progressing toward goals    Frequency    Min 3X/week      PT Plan Current plan remains appropriate    Co-evaluation              AM-PAC PT "6 Clicks" Mobility   Outcome Measure  Help needed turning from your back to your side while in a flat bed without using bedrails?: None Help needed moving from lying on  your back to sitting on the side of a flat bed without using bedrails?: A Little Help needed moving to and from a bed to a chair (including a wheelchair)?: A Little Help needed standing up from a chair using your arms (e.g., wheelchair or bedside chair)?: A Little Help needed to walk in hospital room?: A Little Help needed climbing 3-5 steps with a railing? : A Little 6 Click Score: 19    End of Session   Activity Tolerance: Patient tolerated treatment well Patient left: in bed;with call bell/phone within reach Nurse Communication: Mobility status PT Visit Diagnosis: Other abnormalities of gait and mobility (R26.89)     Time: 6384-5364 PT Time Calculation (min) (ACUTE ONLY): 21 min  Charges:  $Gait Training: 8-22  mins                     11/05/2020  Jacinto Halim., PT Acute Rehabilitation Services [phone]  (pager) [phone]  (office)   Christine Benitez 11/05/2020, 5:01 PM

## 2020-11-05 NOTE — Progress Notes (Signed)
Pm CBG 421- pt had just drank an entire Ensure before CBG was checked. Notified [facility] on-call and got orders for pm coverage, see MAR.

## 2020-11-05 NOTE — Progress Notes (Signed)
PROGRESS NOTE    Christine Benitez  GYJ:[id number] DOB: 01-Jul-1960 DOA: 11/02/2020 PCP: Lupita Dawn, MD    Chief Complaint  Patient presents with   Hypoglycemia    Brief Narrative:   60 year old female with brittle diabetes, cardiomyopathy, chronic kidney disease, admitted to the hospital with hypoglycemia and acute metabolic encephalopathy.  Recent admission for urinary tract infection which she was discharged on 6/21.  Insulin doses were adjusted at that time.  Since her admission, her insulin has been held and blood sugars have since improved.  Lantus and NovoLog have been restarted at reduced doses.  Continue to monitor for stability of blood sugars.  Assessment & Plan:   Principal Problem:   Hypoglycemia Active Problems:   Type 2 diabetes mellitus with complication, with long-term current use of insulin (HCC)   Chronic systolic CHF (congestive heart failure) (HCC)   Constipation   Cachexia (HCC)   Malnutrition of moderate degree   Severe hyperglycemia in the setting of type 2 diabetes and insulin-dependent, brittle blood sugars Patient is on Lantus and sliding scale insulin.  Patient had an episode of hypoglycemia with cold clammy and diaphoresis Decrease the dose of Lantus to 5 units. Continue with sliding scale insulin.    History of chronic systolic heart failure She appears to be compensated at this time.  Last echocardiogram showed left ventricular ejection fraction of 50 to 55% Continue with strict intake and output and daily weights  Stage IIIa CKD Creatinine appears to be at baseline.    Moderate malnutrition Dietary on board.    Hypotension appears to have resolved  History of CVA Continue with aspirin Plavix and statin.    DVT prophylaxis: lovenox.  Code Status: DNR Family Communication: none at bedside.  Disposition:   Status is: Inpatient  Remains inpatient appropriate because:Ongoing diagnostic testing needed not appropriate for  outpatient work up  Dispo: The patient is from: Home              Anticipated d/c is to: Home              Patient currently is not medically stable to d/c.   Difficult to place patient No       Consultants:  None.   Procedures: none.  Antimicrobials: none.    Subjective: No new complaints.   Objective: Vitals:   11/04/20 2317 11/05/20 0417 11/05/20 0720 11/05/20 1152  BP: (!) 115/58 117/65 (!) 95/57 110/64  Pulse: 70 60 61 66  Resp: 18 18 17 18   Temp: 98.6 F (37 C) 98.2 F (36.8 C) 98.9 F (37.2 C)   TempSrc: Oral Oral Oral Oral  SpO2: 90% 95% 100% 98%  Weight:      Height:        Intake/Output Summary (Last 24 hours) at 11/05/2020 1446 Last data filed at 11/05/2020 1024 Gross per 24 hour  Intake 1222.5 ml  Output 1450 ml  Net -227.5 ml   Filed Weights   11/02/20 1619 11/02/20 2248 11/04/20 0351  Weight: 42.6 kg 42.6 kg 47.7 kg    Examination:  General exam: Appears calm and comfortable  Respiratory system: Clear to auscultation. Respiratory effort normal. Cardiovascular system: S1 & S2 heard, RRR. No JVD, No pedal edema. Gastrointestinal system: Abdomen is nondistended, soft and nontender.  Normal bowel sounds heard. Central nervous system: Alert and oriented to person only.  Extremities: Symmetric 5 x 5 power. Skin: No rashes, lesions or ulcers Psychiatry: Mood & affect appropriate.     Data  Reviewed: I have personally reviewed following labs and imaging studies  CBC: Recent Labs  Lab 11/02/20 1628 11/02/20 1654 11/04/20 1034 11/05/20 0132  WBC 6.1  --  6.4 6.8  NEUTROABS 3.6  --   --   --   HGB 9.7* 9.9* 9.3* 9.0*  HCT 31.5* 29.0* 29.3* 28.0*  MCV 102.3*  --  98.0 97.2  PLT 243  --  267 241    Basic Metabolic Panel: Recent Labs  Lab 11/02/20 1654 11/02/20 1823 11/04/20 1034 11/05/20 0132  NA 137 138 138 136  K 8.2* 4.9 4.6 5.8*  CL 109 107 104 107  CO2  --  24 23 23   GLUCOSE 24* 86 144* 277*  BUN 38* 23* 25* 26*   CREATININE 1.30* 1.25* 1.31* 1.37*  CALCIUM  --  9.3 9.4 9.2    GFR: Estimated Creatinine Clearance: 33.7 mL/min (A) (by C-G formula based on SCr of 1.37 mg/dL (H)).  Liver Function Tests: Recent Labs  Lab 11/04/20 1034 11/05/20 0132  AST 37 30  ALT 40 36  ALKPHOS 54 47  BILITOT 0.3 0.2*  PROT 5.7* 5.6*  ALBUMIN 2.8* 2.8*    CBG: Recent Labs  Lab 11/04/20 1614 11/04/20 2131 11/05/20 0625 11/05/20 1151 11/05/20 1217  GLUCAP 194* 286* 230* 27* 178*     Recent Results (from the past 240 hour(s))  SARS CORONAVIRUS 2 (TAT 6-24 HRS) Nasopharyngeal Nasopharyngeal Swab     Status: None   Collection Time: 11/02/20  9:13 PM   Specimen: Nasopharyngeal Swab  Result Value Ref Range Status   SARS Coronavirus 2 NEGATIVE NEGATIVE Final    Comment: (NOTE) SARS-CoV-2 target nucleic acids are NOT DETECTED.  The SARS-CoV-2 RNA is generally detectable in upper and lower respiratory specimens during the acute phase of infection. Negative results do not preclude SARS-CoV-2 infection, do not rule out co-infections with other pathogens, and should not be used as the sole basis for treatment or other patient management decisions. Negative results must be combined with clinical observations, patient history, and epidemiological information. The expected result is Negative.  Fact Sheet for Patients: 11/04/20  Fact Sheet for Healthcare Providers: HairSlick.no  This test is not yet approved or cleared by the quierodirigir.com FDA and  has been authorized for detection and/or diagnosis of SARS-CoV-2 by FDA under an Emergency Use Authorization (EUA). This EUA will remain  in effect (meaning this test can be used) for the duration of the COVID-19 declaration under Se ction 564(b)(1) of the Act, 21 U.S.C. section 360bbb-3(b)(1), unless the authorization is terminated or revoked sooner.  Performed at [facility],  [address] [place], [place]          Radiology Studies: No results found.      Scheduled Meds:  aspirin EC  81 mg Oral Daily   atorvastatin  40 mg Oral QPM   clopidogrel  75 mg Oral Daily   dronabinol  5 mg Oral BID AC   enoxaparin (LOVENOX) injection  20 mg Subcutaneous Q24H   feeding supplement  237 mL Oral BID BM   insulin aspart  0-6 Units Subcutaneous TID WC   insulin aspart  10 Units Subcutaneous TID WC   insulin glargine  8 Units Subcutaneous Daily   ivabradine  2.5 mg Oral BID WC   mirtazapine  15 mg Oral QHS   multivitamin with minerals  1 tablet Oral Daily   sodium zirconium cyclosilicate  10 g Oral BID  Continuous Infusions:   LOS: 2 days        Kathlen Mody, MD [facility]   To contact the attending provider between 7A-7P or the covering provider during after hours 7P-7A, please log into the web site [email or web address] and access using universal [place] password for that web site. If you do not have the password, please call the hospital operator.  11/05/2020, 2:46 PM

## 2020-11-05 NOTE — Progress Notes (Signed)
CBG 27. Dextrose 50% given. MD notified. Will continue to monitor.  Sabra Heck, RN

## 2020-11-06 LAB — BASIC METABOLIC PANEL
Anion gap: 9 (ref 5–15)
BUN: 35 mg/dL — ABNORMAL HIGH (ref 6–20)
CO2: 23 mmol/L (ref 22–32)
Calcium: 9.4 mg/dL (ref 8.9–10.3)
Chloride: 104 mmol/L (ref 98–111)
Creatinine, Ser: 1.35 mg/dL — ABNORMAL HIGH (ref 0.44–1.00)
GFR, Estimated: 46 mL/min — ABNORMAL LOW (ref >60)
Glucose, Bld: 215 mg/dL — ABNORMAL HIGH (ref 70–99)
Potassium: 5.1 mmol/L (ref 3.5–5.1)
Sodium: 136 mmol/L (ref 135–145)

## 2020-11-06 LAB — GLUCOSE, CAPILLARY
Glucose-Capillary: 526 mg/dL (ref 70–99)
Glucose-Capillary: 491 mg/dL — ABNORMAL HIGH (ref 70–99)
Glucose-Capillary: 381 mg/dL — ABNORMAL HIGH (ref 70–99)
Glucose-Capillary: 241 mg/dL — ABNORMAL HIGH (ref 70–99)
Glucose-Capillary: 88 mg/dL (ref 70–99)
Glucose-Capillary: 252 mg/dL — ABNORMAL HIGH (ref 70–99)

## 2020-11-06 MED ORDER — ADULT MULTIVITAMIN W/MINERALS CH: 1.0000 | ORAL_TABLET | Freq: Every day | ORAL | Status: DC

## 2020-11-06 MED ORDER — INSULIN GLARGINE 100 UNIT/ML ~~LOC~~ SOLN: 5.0000 [IU] | Freq: Every day | SUBCUTANEOUS | 11 refills | Status: DC

## 2020-11-06 MED ORDER — INSULIN ASPART 100 UNIT/ML IJ SOLN
15.0000 [IU] | Freq: Once | INTRAMUSCULAR | Status: AC
  Administered 2020-11-06: 15 [IU] via SUBCUTANEOUS

## 2020-11-06 MED ORDER — ENSURE ENLIVE PO LIQD: 237.0000 mL | Freq: Two times a day (BID) | ORAL | 12 refills | Status: DC

## 2020-11-06 NOTE — TOC Transition Note (Addendum)
Transition of Care (TOC) - CM/SW Discharge Note Marvetta Gibbons RN, BSN Transitions of Care Unit 4E- RN Case Manager See Treatment Team for direct phone #    Patient Details  Name: ZIARA THELANDER MRN: [id number] Date of Birth: [birth year]  Transition of Care [facility]) CM/SW Contact:  Dawayne Patricia, RN Phone Number: 11/06/2020, 3:46 PM   Clinical Narrative:    Pt stable for transition home per MD, [facility] has confirmed pt has met eligibility for [facility] services. They will be contacting her to see her in the home for disease management HHRN f/u.   Have notified Stacie with [facility] of pt's discharge today for start of care needs.   Pt requesting transportation assistance home with cab. Will see if [facility] [employer] can be arranged for door to door assistance.  Address- [address], [place]  Final next level of care: [place] Barriers to Discharge: Barriers Resolved   Patient Goals and CMS Choice Patient states their goals for this hospitalization and ongoing recovery are:: return home   Choice offered to / list presented to : NA (charity referral- pt un-insured)  Discharge Placement               Home with [facility]        Discharge Plan and Services   Discharge Planning Services: [facility] Care Choice: Home Health          DME Arranged: N/A DME Agency: NA       HH Arranged: RN, Disease Management HH Agency: [place] Date HH Agency Contacted: 11/04/20 Time HH Agency Contacted: 1740 Representative spoke with at [place]: [place] ([place]) Interventions     Readmission Risk Interventions Readmission Risk Prevention Plan 11/06/2020  Transportation Screening Complete  PCP or Specialist Appt within 5-7 Days Not Complete  Not Complete comments July 29  [place] Screening Complete  Medication Review (RN CM) Complete  Some recent data might be hidden

## 2020-11-06 NOTE — Progress Notes (Signed)
PROGRESS NOTE    Christine Benitez  RKY:[id number] DOB: April 02, 1961 DOA: 11/02/2020 PCP: Lupita Dawn, MD    Chief Complaint  Patient presents with   Hypoglycemia    Brief Narrative:   60 year old female with brittle diabetes, cardiomyopathy, chronic kidney disease, admitted to the hospital with hypoglycemia and acute metabolic encephalopathy.  Recent admission for urinary tract infection which she was discharged on 6/21.  Insulin doses were adjusted at that time.  Since her admission, her insulin has been held and blood sugars have since improved.  Lantus and NovoLog have been restarted at reduced doses.  Continue to monitor for stability of blood sugars.  Assessment & Plan:   Principal Problem:   Hypoglycemia Active Problems:   Type 2 diabetes mellitus with complication, with long-term current use of insulin (HCC)   Chronic systolic CHF (congestive heart failure) (HCC)   Constipation   Cachexia (HCC)   Malnutrition of moderate degree   Severe hypoglycemia in the setting of type 2 diabetes and insulin-dependent, brittle blood sugars Patient is on Lantus and sliding scale insulin.  Pt had an episode of hypoglycemia with CBG of 20, decreased the dose of lantus and continue with SSI, and meal coverage.  CBG (last 3)  Recent Labs    11/06/20 0758 11/06/20 1122 11/06/20 1609  GLUCAP 381* 241* 88   Monitor CBG'S for another 24 hours and if stable, plan for discharge in am.     History of chronic systolic heart failure She appears to be compensated at this time.  Last echocardiogram showed left ventricular ejection fraction of 50 to 55%, with grade 1 diastolic function.  Continue with strict intake and output and daily weights.   Cachexia, anorexia, poor oral intake and severe protein calorie malnutrition;  Therapy eval recommending Home health PT.   Stage IIIa CKD Creatinine appears to be at baseline.    Moderate malnutrition Dietary on board.    Hypotension appears  to have resolved  History of CVA Continue with aspirin Plavix and statin.  UNABLE TO reach the mother to discuss checking CBG'S and insulin injections TIDAC.     DVT prophylaxis: lovenox.  Code Status: DNR Family Communication: none at bedside.  Disposition:   Status is: Inpatient  Remains inpatient appropriate because:Ongoing diagnostic testing needed not appropriate for outpatient work up  Dispo: The patient is from: Home              Anticipated d/c is to: Home              Patient currently is not medically stable to d/c.   Difficult to place patient No       Consultants:  None.   Procedures: none.  Antimicrobials: none.    Subjective: PT wants to go home in am.   Objective: Vitals:   11/06/20 0303 11/06/20 0746 11/06/20 1122 11/06/20 1608  BP: 122/69 109/63 (!) 97/47 (!) 105/54  Pulse: 74 80 66 70  Resp: 18 17 18 17   Temp: 98.8 F (37.1 C) 97.8 F (36.6 C) 98.2 F (36.8 C) 97.6 F (36.4 C)  TempSrc: Oral Oral Oral Oral  SpO2: 100% 98% 99% 100%  Weight:      Height:        Intake/Output Summary (Last 24 hours) at 11/06/2020 1704 Last data filed at 11/06/2020 0834 Gross per 24 hour  Intake 1200 ml  Output 850 ml  Net 350 ml    Filed Weights   11/02/20 1619 11/02/20 2248 11/04/20 0351  Weight: 42.6 kg 42.6 kg 47.7 kg    Examination:  General exam: cachetic looking woman, not in distress.  Respiratory system: air entry fair, no wheezing heard.  Cardiovascular system: S1 & S2 heard, RRR. No JVD, No pedal edema. Gastrointestinal system: Abdomen is soft, NT ND BS+ Central nervous system: Alert and oriented to person and place.  Extremities: no pedal edema.  Skin: No rashes seen.  Psychiatry: mood is appropriate.     Data Reviewed: I have personally reviewed following labs and imaging studies  CBC: Recent Labs  Lab 11/02/20 1628 11/02/20 1654 11/04/20 1034 11/05/20 0132  WBC 6.1  --  6.4 6.8  NEUTROABS 3.6  --   --   --   HGB 9.7*  9.9* 9.3* 9.0*  HCT 31.5* 29.0* 29.3* 28.0*  MCV 102.3*  --  98.0 97.2  PLT 243  --  267 241     Basic Metabolic Panel: Recent Labs  Lab 11/02/20 1654 11/02/20 1823 11/04/20 1034 11/05/20 0132 11/05/20 1409 11/06/20 1059  NA 137 138 138 136  --  136  K 8.2* 4.9 4.6 5.8* 5.2* 5.1  CL 109 107 104 107  --  104  CO2  --  24 23 23   --  23  GLUCOSE 24* 86 144* 277*  --  215*  BUN 38* 23* 25* 26*  --  35*  CREATININE 1.30* 1.25* 1.31* 1.37*  --  1.35*  CALCIUM  --  9.3 9.4 9.2  --  9.4     GFR: Estimated Creatinine Clearance: 34.2 mL/min (A) (by C-G formula based on SCr of 1.35 mg/dL (H)).  Liver Function Tests: Recent Labs  Lab 11/04/20 1034 11/05/20 0132  AST 37 30  ALT 40 36  ALKPHOS 54 47  BILITOT 0.3 0.2*  PROT 5.7* 5.6*  ALBUMIN 2.8* 2.8*     CBG: Recent Labs  Lab 11/06/20 0601 11/06/20 0655 11/06/20 0758 11/06/20 1122 11/06/20 1609  GLUCAP 526* 491* 381* 241* 88      Recent Results (from the past 240 hour(s))  SARS CORONAVIRUS 2 (TAT 6-24 HRS) Nasopharyngeal Nasopharyngeal Swab     Status: None   Collection Time: 11/02/20  9:13 PM   Specimen: Nasopharyngeal Swab  Result Value Ref Range Status   SARS Coronavirus 2 NEGATIVE NEGATIVE Final    Comment: (NOTE) SARS-CoV-2 target nucleic acids are NOT DETECTED.  The SARS-CoV-2 RNA is generally detectable in upper and lower respiratory specimens during the acute phase of infection. Negative results do not preclude SARS-CoV-2 infection, do not rule out co-infections with other pathogens, and should not be used as the sole basis for treatment or other patient management decisions. Negative results must be combined with clinical observations, patient history, and epidemiological information. The expected result is Negative.  Fact Sheet for Patients: 11/04/20  Fact Sheet for Healthcare Providers: HairSlick.no  This test is not yet  approved or cleared by the quierodirigir.com FDA and  has been authorized for detection and/or diagnosis of SARS-CoV-2 by FDA under an Emergency Use Authorization (EUA). This EUA will remain  in effect (meaning this test can be used) for the duration of the COVID-19 declaration under Se ction 564(b)(1) of the Act, 21 U.S.C. section 360bbb-3(b)(1), unless the authorization is terminated or revoked sooner.  Performed at [facility], [address], [place], [place]           Radiology Studies: No results found.      Scheduled Meds:  aspirin  EC  81 mg Oral Daily   atorvastatin  40 mg Oral QPM   clopidogrel  75 mg Oral Daily   dronabinol  5 mg Oral BID AC   enoxaparin (LOVENOX) injection  20 mg Subcutaneous Q24H   feeding supplement  237 mL Oral BID BM   insulin aspart  0-5 Units Subcutaneous QHS   insulin aspart  0-9 Units Subcutaneous TID WC   insulin aspart  10 Units Subcutaneous TID WC   insulin glargine  5 Units Subcutaneous Daily   ivabradine  2.5 mg Oral BID WC   mirtazapine  15 mg Oral QHS   multivitamin with minerals  1 tablet Oral Daily   Continuous Infusions:   LOS: 3 days        Kathlen Mody, MD [facility]   To contact the attending provider between 7A-7P or the covering provider during after hours 7P-7A, please log into the web site [email or web address] and access using universal [place] password for that web site. If you do not have the password, please call the hospital operator.  11/06/2020, 5:04 PM

## 2020-11-06 NOTE — Progress Notes (Signed)
CBG 526 this AM- pt did not have any intake since last night. Hall MD notified and gave 15u novolog and initiated Q1 CBG checks x5.

## 2020-11-06 NOTE — Progress Notes (Addendum)
Attempted to call patients mother x2 about discharge instructions. Notified MD.

## 2020-11-07 LAB — GLUCOSE, CAPILLARY
Glucose-Capillary: 416 mg/dL — ABNORMAL HIGH (ref 70–99)
Glucose-Capillary: 180 mg/dL — ABNORMAL HIGH (ref 70–99)
Glucose-Capillary: 77 mg/dL (ref 70–99)
Glucose-Capillary: 176 mg/dL — ABNORMAL HIGH (ref 70–99)

## 2020-11-07 MED ORDER — INSULIN GLARGINE 100 UNIT/ML ~~LOC~~ SOLN
8.0000 [IU] | Freq: Every day | SUBCUTANEOUS | Status: DC
  Administered 2020-11-07 – 2020-11-09 (×3): 8 [IU] via SUBCUTANEOUS
  Filled 2020-11-07 (×3): qty 0.08

## 2020-11-07 NOTE — Progress Notes (Signed)
OT Cancellation Note  Patient Details Name: Christine Benitez MRN: [id number] DOB: 1960/12/30   Cancelled Treatment:    Reason Eval/Treat Not Completed: Other (comment) RN reports pt had just woken up and now eating breakfast. Will check back as time allows for OT session.  Lenor Derrick., COTA/L Acute Rehabilitation Services [phone]   Christine Benitez 11/07/2020, 10:24 AM

## 2020-11-07 NOTE — Progress Notes (Signed)
Nutrition Follow Up  DOCUMENTATION CODES:   Non-severe (moderate) malnutrition in context of chronic illness, Underweight  INTERVENTION:   Ensure Enlive po BID, each supplement provides 350 kcal and 20 grams of protein Magic cup BID with meals, each supplement provides 290 kcal and 9 grams of protein MVI daily   NUTRITION DIAGNOSIS:   Moderate Malnutrition related to chronic illness (CHF, CVA, DM) as evidenced by mild fat depletion, mild muscle depletion, moderate muscle depletion.  Ongoing  GOAL:   Patient will meet greater than or equal to 90% of their needs  Progressing   MONITOR:   PO intake, Supplement acceptance, Labs, Weight trends, Skin, I & O's  REASON FOR ASSESSMENT:   Consult Assessment of nutrition requirement/status  ASSESSMENT:   Patient with medical history significant of insulin-dependent diabetes, chronic systolic CHF, nonischemic cardiomyopathy, pulmonary hypertension, CVA, hypertension, and CKD stage IIIa. Pt admitted with severe hypoglycemia.   Appetite progressing. Last four meal completions charted as 80%, 50%, 25%, and 75%. Taking Ensure 1-2 times daily. Noted elevated CBGs- recommend continuing Ensure given it provides the most kcal and protein and patient is malnourished. Insulin continues with adjustment given brittle DM.   Will need repeat NFPE upon follow up if time allows.   Admission weight: 42.6 kg  Current weight: 47.7 kg   UOP: 2100 ml x 24 hrs   Medications: marinol, SS novolog, lantus, remeron Labs: CBG 88-416  Diet Order:   Diet Order             Diet - low sodium heart healthy           Diet Carb Modified Fluid consistency: Thin; Room service appropriate? Yes with Assist  Diet effective now                   EDUCATION NEEDS:   Education needs have been addressed  Skin:  Skin Assessment: Reviewed RN Assessment  Last BM:  6/29  Height:   Ht Readings from Last 1 Encounters:  11/02/20 5\' 4"  (1.626 m)     Weight:   Wt Readings from Last 1 Encounters:  11/04/20 47.7 kg    Ideal Body Weight:  54.5 kg  BMI:  Body mass index is 18.05 kg/m.  Estimated Nutritional Needs:   Kcal:  1500-1700  Protein:  70-85 grams  Fluid:  > 1.5 L  Erwin Nishiyama MS, RD, LDN, CNSC Clinical Nutrition Pager listed in AMION

## 2020-11-07 NOTE — Progress Notes (Signed)
PROGRESS NOTE    Christine Benitez  EQA:[id number] DOB: 07/03/1960 DOA: 11/02/2020 PCP: Lupita Dawn, MD    Chief Complaint  Patient presents with   Hypoglycemia    Brief Narrative:   60 year old female with brittle diabetes, cardiomyopathy, chronic kidney disease, admitted to the hospital with hypoglycemia and acute metabolic encephalopathy.  Recent admission for urinary tract infection which she was discharged on 6/21.  Insulin doses were adjusted at that time.  Since her admission, her insulin has been held and blood sugars have since improved.  Lantus and NovoLog have been restarted at reduced doses.  Continue to monitor for stability of blood sugars. Unable to reach family to discharge the patient. Nobody is picking up the phone.  SW consulted for safe d.c plan.  Therapy eval recommending home health services.   Assessment & Plan:   Principal Problem:   Hypoglycemia Active Problems:   Type 2 diabetes mellitus with complication, with long-term current use of insulin (HCC)   Chronic systolic CHF (congestive heart failure) (HCC)   Constipation   Cachexia (HCC)   Malnutrition of moderate degree   Severe hypoglycemia in the setting of type 2 diabetes and insulin-dependent, brittle blood sugars  CBG (last 3)  Recent Labs    11/07/20 0602 11/07/20 1142 11/07/20 1621  GLUCAP 416* 180* 77   Increased Levemir to 8 units daily and SSi.      History of chronic systolic heart failure She appears to be compensated at this time.  Last echocardiogram showed left ventricular ejection fraction of 50 to 55%, improved from last echo, with grade 1 diastolic function.  Continue with strict intake and output and daily weights.   Cachexia, anorexia, poor oral intake and severe protein calorie malnutrition;  Therapy eval recommending Home health PT.   Stage IIIa CKD Creatinine appears to be at baseline.    Moderate malnutrition Dietary on board.    Hypotension appears to have  resolved  History of CVA Continue with aspirin Plavix and statin.      DVT prophylaxis: lovenox.  Code Status: DNR Family Communication: none at bedside.  Disposition:   Status is: Inpatient  Remains inpatient appropriate because:Ongoing diagnostic testing needed not appropriate for outpatient work up  Dispo: The patient is from: Home              Anticipated d/c is to: Home              Patient currently is not medically stable to d/c.   Difficult to place patient No       Consultants:  None.   Procedures: none.  Antimicrobials: none.    Subjective: No new complaints.   Objective: Vitals:   11/06/20 2333 11/07/20 0420 11/07/20 0838 11/07/20 1141  BP: (!) 111/59 136/89 (!) 92/54 100/63  Pulse: 76 71 71 76  Resp: 19 18 18 18   Temp: 98.8 F (37.1 C) 98.3 F (36.8 C) 98 F (36.7 C) 97.8 F (36.6 C)  TempSrc: Oral Oral Oral Oral  SpO2: 98% 100% 99% 100%  Weight:      Height:        Intake/Output Summary (Last 24 hours) at 11/07/2020 1720 Last data filed at 11/06/2020 1749 Gross per 24 hour  Intake --  Output 700 ml  Net -700 ml    Filed Weights   11/02/20 1619 11/02/20 2248 11/04/20 0351  Weight: 42.6 kg 42.6 kg 47.7 kg    Examination:  General exam: Cachectic looking woman not in any  kind of distress head Respiratory system: Entry fair bilateral no wheezing heard Cardiovascular system: S1-S2 heard, regular rate rhythm, no pedal edema Gastrointestinal system: Abdomen is soft, nontender nondistended bowel sounds normal Central nervous system: Alert and oriented to person and place Extremities: No cyanosis Skin: No rashes seen Psychiatry: mood is appropriate.     Data Reviewed: I have personally reviewed following labs and imaging studies  CBC: Recent Labs  Lab 11/02/20 1628 11/02/20 1654 11/04/20 1034 11/05/20 0132  WBC 6.1  --  6.4 6.8  NEUTROABS 3.6  --   --   --   HGB 9.7* 9.9* 9.3* 9.0*  HCT 31.5* 29.0* 29.3* 28.0*  MCV 102.3*   --  98.0 97.2  PLT 243  --  267 241     Basic Metabolic Panel: Recent Labs  Lab 11/02/20 1654 11/02/20 1823 11/04/20 1034 11/05/20 0132 11/05/20 1409 11/06/20 1059  NA 137 138 138 136  --  136  K 8.2* 4.9 4.6 5.8* 5.2* 5.1  CL 109 107 104 107  --  104  CO2  --  24 23 23   --  23  GLUCOSE 24* 86 144* 277*  --  215*  BUN 38* 23* 25* 26*  --  35*  CREATININE 1.30* 1.25* 1.31* 1.37*  --  1.35*  CALCIUM  --  9.3 9.4 9.2  --  9.4     GFR: Estimated Creatinine Clearance: 34.2 mL/min (A) (by C-G formula based on SCr of 1.35 mg/dL (H)).  Liver Function Tests: Recent Labs  Lab 11/04/20 1034 11/05/20 0132  AST 37 30  ALT 40 36  ALKPHOS 54 47  BILITOT 0.3 0.2*  PROT 5.7* 5.6*  ALBUMIN 2.8* 2.8*     CBG: Recent Labs  Lab 11/06/20 1609 11/06/20 2010 11/07/20 0602 11/07/20 1142 11/07/20 1621  GLUCAP 88 252* 416* 180* 77      Recent Results (from the past 240 hour(s))  SARS CORONAVIRUS 2 (TAT 6-24 HRS) Nasopharyngeal Nasopharyngeal Swab     Status: None   Collection Time: 11/02/20  9:13 PM   Specimen: Nasopharyngeal Swab  Result Value Ref Range Status   SARS Coronavirus 2 NEGATIVE NEGATIVE Final    Comment: (NOTE) SARS-CoV-2 target nucleic acids are NOT DETECTED.  The SARS-CoV-2 RNA is generally detectable in upper and lower respiratory specimens during the acute phase of infection. Negative results do not preclude SARS-CoV-2 infection, do not rule out co-infections with other pathogens, and should not be used as the sole basis for treatment or other patient management decisions. Negative results must be combined with clinical observations, patient history, and epidemiological information. The expected result is Negative.  Fact Sheet for Patients: 11/04/20  Fact Sheet for Healthcare Providers: HairSlick.no  This test is not yet approved or cleared by the quierodirigir.com FDA and  has been  authorized for detection and/or diagnosis of SARS-CoV-2 by FDA under an Emergency Use Authorization (EUA). This EUA will remain  in effect (meaning this test can be used) for the duration of the COVID-19 declaration under Se ction 564(b)(1) of the Act, 21 U.S.C. section 360bbb-3(b)(1), unless the authorization is terminated or revoked sooner.  Performed at [facility], [address] [place], [place]           Radiology Studies: No results found.      Scheduled Meds:  aspirin EC  81 mg Oral Daily   atorvastatin  40 mg Oral QPM   clopidogrel  75 mg Oral Daily  dronabinol  5 mg Oral BID AC   enoxaparin (LOVENOX) injection  20 mg Subcutaneous Q24H   feeding supplement  237 mL Oral BID BM   insulin aspart  0-5 Units Subcutaneous QHS   insulin aspart  0-9 Units Subcutaneous TID WC   insulin aspart  10 Units Subcutaneous TID WC   insulin glargine  8 Units Subcutaneous Daily   ivabradine  2.5 mg Oral BID WC   mirtazapine  15 mg Oral QHS   multivitamin with minerals  1 tablet Oral Daily   Continuous Infusions:   LOS: 4 days        Kathlen Mody, MD [facility]   To contact the attending provider between 7A-7P or the covering provider during after hours 7P-7A, please log into the web site [email or web address] and access using universal [place] password for that web site. If you do not have the password, please call the hospital operator.  11/07/2020, 5:20 PM

## 2020-11-07 NOTE — Progress Notes (Signed)
Physical Therapy Treatment Patient Details Name: Christine Benitez MRN: [id number] DOB: [birth year] Today's Date: 11/07/2020    History of Present Illness Pt is a 60 y/o female admitted 6/26 secondary to hypoglycemia and unresponsiveness. PMH includes DM, CHF, CKD, cardiomyopathy, HTN, CVA,    PT Comments    Pt is closing in on attaining goals, but still feels weak.  Emphasis on transitions, scooting, sit to stand from various levels and progressing gait stability, stamina and gait quality.    Follow Up Recommendations  Home health PT     Equipment Recommendations  None recommended by PT    Recommendations for Other Services       Precautions / Restrictions Precautions Precautions: Fall Precaution Comments: impaired vision, likely baseline, unable to see small print( issued pt readers 2+, vision improved on 7/1), very hard of hearing    Mobility  Bed Mobility Overal bed mobility: Needs Assistance       Supine to sit: Supervision Sit to supine: Supervision        Transfers Overall transfer level: Needs assistance Equipment used: Rolling walker (2 wheeled) Transfers: Sit to/from Stand Sit to Stand: Min guard;Min assist (min from lower surface.)            Ambulation/Gait Ambulation/Gait assistance: Min guard Gait Distance (Feet): 200 Feet Assistive device: Rolling walker (2 wheeled) Gait Pattern/deviations: Step-through pattern Gait velocity: reduced Gait velocity interpretation: <1.8 ft/sec, indicate of risk for recurrent falls General Gait Details: very small, slow steps.  Still feeling weak.  Very little noticeable change when pt asked to go as fast as possible.  Steps still very low amplitude.   Stairs             Wheelchair Mobility    Modified Rankin (Stroke Patients Only)       Balance Overall balance assessment: Needs assistance Sitting-balance support: No upper extremity supported;Feet supported Sitting balance-Leahy Scale: Good      Standing balance support: Bilateral upper extremity supported Standing balance-Leahy Scale: Poor Standing balance comment: Reliant on BUE support for dynamic balance                            Cognition Arousal/Alertness: Awake/alert Behavior During Therapy: Flat affect;WFL for tasks assessed/performed Overall Cognitive Status: No family/caregiver present to determine baseline cognitive functioning                         Following Commands: Follows one step commands with increased time   Awareness: Intellectual Problem Solving: Slow processing;Decreased initiation;Difficulty sequencing        Exercises      General Comments General comments (skin integrity, edema, etc.): vss on RA, SpO2 98% and EHR 95 bpm      Pertinent Vitals/Pain Pain Assessment: No/denies pain Pain Intervention(s): Monitored during session    Home Living Family/patient expects to be discharged to:: Private residence                    Prior Function            PT Goals (current goals can now be found in the care plan section) Acute Rehab PT Goals PT Goal Formulation: With patient Time For Goal Achievement: 11/18/20 Potential to Achieve Goals: Good Progress towards PT goals: Progressing toward goals    Frequency    Min 3X/week      PT Plan Current plan remains appropriate    Co-evaluation  AM-PAC PT "6 Clicks" Mobility   Outcome Measure  Help needed turning from your back to your side while in a flat bed without using bedrails?: None Help needed moving from lying on your back to sitting on the side of a flat bed without using bedrails?: A Little Help needed moving to and from a bed to a chair (including a wheelchair)?: A Little Help needed standing up from a chair using your arms (e.g., wheelchair or bedside chair)?: A Little Help needed to walk in hospital room?: A Little Help needed climbing 3-5 steps with a railing? : A Little 6  Click Score: 19    End of Session   Activity Tolerance: Patient tolerated treatment well Patient left: in bed;with call bell/phone within reach Nurse Communication: Mobility status PT Visit Diagnosis: Other abnormalities of gait and mobility (R26.89)     Time: 8921-1941 PT Time Calculation (min) (ACUTE ONLY): 25 min  Charges:  $Gait Training: 8-22 mins $Therapeutic Activity: 8-22 mins                     11/07/2020  Jacinto Halim., PT Acute Rehabilitation Services [phone]  (pager) [phone]  (office)   Eliseo Gum Macie Baum 11/07/2020, 4:58 PM

## 2020-11-07 NOTE — Progress Notes (Signed)
Occupational Therapy Treatment Patient Details Name: Christine Benitez MRN: [id number] DOB: July 01, 1960 Today's Date: 11/07/2020    History of present illness Pt is a 60 y/o female admitted 6/26 secondary to hypoglycemia and unresponsiveness. PMH includes DM, CHF, CKD, cardiomyopathy, HTN, CVA,   OT comments  Pt making steady progress towards OT goals this session. Session focus on further assessment of cognition, vision and overall problem solving/ safety awareness. Assessed using the Pill Box Test. Pt failed the assessment, demonstrating poor planning, mental flexibility, suboptimal search strategies, concrete thinking and the inability to multitask. After this OTA provided standardized instructions of assessment pt reports "I cant do that" making no efforts to properly place pills. Deferred full standardized assessments after several minutes of pt attempting to problem solve through task  and completed one medication with pt needing MAX assistance from OTA. Pt also noted to need MAX multimodal cues to problem solve opening pill bottles, issued pt reading glasses (2+) and pt was able to accurately read out all pill bottle labels. Highly recommend pt have supervision with medication mgmt at time of DC.   Follow Up Recommendations  Home health OT    Equipment Recommendations  None recommended by OT    Recommendations for Other Services      Precautions / Restrictions Precautions Precautions: Fall Precaution Comments: impaired vision, likely baseline, unable to see small print( issued pt readers 2+, vision improved on 7/1), very hard of hearing Restrictions Weight Bearing Restrictions: No       Mobility Bed Mobility               General bed mobility comments: session conducted from bed level    Transfers                      Balance                                           ADL either performed or assessed with clinical judgement   ADL Overall  ADL's : Needs assistance/impaired     Grooming: Wash/dry face;Bed level;Set up Grooming Details (indicate cue type and reason): from bed level, declined oral care and declined OOB ADLs                               General ADL Comments: session focus of administration of pillbox assessment and BADL reeducation     Vision   Additional Comments: vision improved when reading small print with reading glasses 2+   Perception     Praxis      Cognition Arousal/Alertness: Awake/alert Behavior During Therapy: Flat affect Overall Cognitive Status: No family/caregiver present to determine baseline cognitive functioning Area of Impairment: Attention;Memory;Following commands;Safety/judgement;Awareness;Problem solving                   Current Attention Level: Focused Memory: Decreased short-term memory (repeated cues) Following Commands: Follows one step commands inconsistently;Follows one step commands with increased time Safety/Judgement: Decreased awareness of safety;Decreased awareness of deficits Awareness: Intellectual Problem Solving: Slow processing;Decreased initiation;Difficulty sequencing;Requires verbal cues;Requires tactile cues General Comments: cognition assessed via pillbox assessment, pt failed assessment, see  OT comments  for more information. noted some impaired problem solving as pt needed max multimodal cues to problem solve how to open pill boxes. pt also very hard of hearing  impacting pts comprehension        Exercises     Shoulder Instructions       General Comments      Pertinent Vitals/ Pain       Pain Assessment: No/denies pain  Home Living                                          Prior Functioning/Environment              Frequency  Min 2X/week        Progress Toward Goals  OT Goals(current goals can now be found in the care plan section)  Progress towards OT goals: Progressing toward  goals  Acute Rehab OT Goals Patient Stated Goal: to go home OT Goal Formulation: With patient Time For Goal Achievement: 11/19/20 Potential to Achieve Goals: Fair  Plan Discharge plan remains appropriate;Frequency remains appropriate    Co-evaluation                 AM-PAC OT "6 Clicks" Daily Activity     Outcome Measure   Help from another person eating meals?: None Help from another person taking care of personal grooming?: A Little Help from another person toileting, which includes using toliet, bedpan, or urinal?: A Little Help from another person bathing (including washing, rinsing, drying)?: A Little Help from another person to put on and taking off regular upper body clothing?: A Little Help from another person to put on and taking off regular lower body clothing?: A Little 6 Click Score: 19    End of Session    OT Visit Diagnosis: Muscle weakness (generalized) (M62.81)   Activity Tolerance Patient tolerated treatment well   Patient Left in bed;with call bell/phone within reach;with bed alarm set   Nurse Communication Mobility status        Time: 4098-1191 OT Time Calculation (min): 15 min  Charges: OT General Charges $OT Visit: 1 Visit OT Treatments $Self Care/Home Management : 8-22 mins  Lenor Derrick., COTA/L Acute Rehabilitation Services [phone]    Barron Schmid 11/07/2020, 11:19 AM

## 2020-11-08 LAB — GLUCOSE, CAPILLARY
Glucose-Capillary: 347 mg/dL — ABNORMAL HIGH (ref 70–99)
Glucose-Capillary: 266 mg/dL — ABNORMAL HIGH (ref 70–99)
Glucose-Capillary: 276 mg/dL — ABNORMAL HIGH (ref 70–99)
Glucose-Capillary: 23 mg/dL — CL (ref 70–99)
Glucose-Capillary: 144 mg/dL — ABNORMAL HIGH (ref 70–99)
Glucose-Capillary: 239 mg/dL — ABNORMAL HIGH (ref 70–99)
Glucose-Capillary: 284 mg/dL — ABNORMAL HIGH (ref 70–99)

## 2020-11-08 MED ORDER — DEXTROSE 50 % IV SOLN
INTRAVENOUS | Status: AC
  Administered 2020-11-08: 50 mL
  Filled 2020-11-08: qty 50

## 2020-11-08 NOTE — Progress Notes (Signed)
PROGRESS NOTE    Christine Benitez  GBT:[id number] DOB: 03-17-1961 DOA: 11/02/2020 PCP: Lupita Dawn, MD    Chief Complaint  Patient presents with   Hypoglycemia    Brief Narrative:   60 year old female with brittle diabetes, cardiomyopathy, chronic kidney disease, admitted to the hospital with hypoglycemia and acute metabolic encephalopathy.  Recent admission for urinary tract infection which she was discharged on 6/21.  Insulin doses were adjusted at that time.  Since her admission, her insulin has been held and blood sugars have since improved.  Lantus and NovoLog have been restarted at reduced doses.  Continue to monitor for stability of blood sugars. Unable to reach family to discharge the patient. Nobody is picking up the phone.  SW consulted for safe d.c plan.  Therapy eval recommending home health services.   Assessment & Plan:   Principal Problem:   Hypoglycemia Active Problems:   Type 2 diabetes mellitus with complication, with long-term current use of insulin (HCC)   Chronic systolic CHF (congestive heart failure) (HCC)   Constipation   Cachexia (HCC)   Malnutrition of moderate degree   Severe hypoglycemia in the setting of type 2 diabetes and insulin-dependent, brittle blood sugars Due to inadequate diet.  Pt did not eat lunch and her CBG has dropped to 23. We will decrease the pre meal coverage to 5 units tidac to be given only if the patient eats about 50% of her meals.  Please assist the patient with feeding .   CBG (last 3)  Recent Labs    11/08/20 1111 11/08/20 1513 11/08/20 1538  GLUCAP 276* 23* 144*   Continue with levemir and SSI.      History of chronic systolic heart failure She appears to be compensated at this time.  Last echocardiogram showed left ventricular ejection fraction of 50 to 55%, improved from last echo, with grade 1 diastolic function.  Continue with strict intake and output and daily weights.   Cachexia, anorexia, poor oral  intake and severe protein calorie malnutrition;  Therapy eval recommending Home health PT.   Stage IIIa CKD Creatinine appears to be at baseline. Creatinine at 1.3.    Moderate malnutrition Dietary on board.    Hypotension appears to have resolved  History of CVA Continue with aspirin Plavix and statin.      DVT prophylaxis: lovenox.  Code Status: DNR Family Communication: none at bedside.  Disposition:   Status is: Inpatient  Remains inpatient appropriate because:Ongoing diagnostic testing needed not appropriate for outpatient work up  Dispo: The patient is from: Home              Anticipated d/c is to: Home              Patient currently is not medically stable to d/c.   Difficult to place patient No       Consultants:  None.   Procedures: none.  Antimicrobials: none.    Subjective: No chest pain or sob.   Objective: Vitals:   11/08/20 0338 11/08/20 0753 11/08/20 1110 11/08/20 1630  BP: (!) 107/59 112/73 113/62 (!) 101/58  Pulse:  89 78 61  Resp: 18 17 15 17   Temp: 98.7 F (37.1 C) 98.3 F (36.8 C) 98.4 F (36.9 C) (!) 97.4 F (36.3 C)  TempSrc: Oral Oral Oral Oral  SpO2: 99% 100% 100% 100%  Weight:      Height:        Intake/Output Summary (Last 24 hours) at 11/08/2020 1639 Last data  filed at 11/08/2020 0800 Gross per 24 hour  Intake 240 ml  Output 500 ml  Net -260 ml    Filed Weights   11/02/20 1619 11/02/20 2248 11/04/20 0351  Weight: 42.6 kg 42.6 kg 47.7 kg    Examination:  General exam: Cachectic looking woman, not in distress.  Respiratory system:clear to auscultation, no wheezing heard.  Cardiovascular system: S1S2 heard, RRR, no JVD, no pedal edema.  Gastrointestinal system: Abdomen is soft, NT ND BS+ Central nervous system: Alert and answering questions appropriately.  Extremities: No edema.  Skin: No rashes seen Psychiatry: mood is appropriate.     Data Reviewed: I have personally reviewed following labs and imaging  studies  CBC: Recent Labs  Lab 11/02/20 1628 11/02/20 1654 11/04/20 1034 11/05/20 0132  WBC 6.1  --  6.4 6.8  NEUTROABS 3.6  --   --   --   HGB 9.7* 9.9* 9.3* 9.0*  HCT 31.5* 29.0* 29.3* 28.0*  MCV 102.3*  --  98.0 97.2  PLT 243  --  267 241     Basic Metabolic Panel: Recent Labs  Lab 11/02/20 1654 11/02/20 1823 11/04/20 1034 11/05/20 0132 11/05/20 1409 11/06/20 1059  NA 137 138 138 136  --  136  K 8.2* 4.9 4.6 5.8* 5.2* 5.1  CL 109 107 104 107  --  104  CO2  --  24 23 23   --  23  GLUCOSE 24* 86 144* 277*  --  215*  BUN 38* 23* 25* 26*  --  35*  CREATININE 1.30* 1.25* 1.31* 1.37*  --  1.35*  CALCIUM  --  9.3 9.4 9.2  --  9.4     GFR: Estimated Creatinine Clearance: 34.2 mL/min (A) (by C-G formula based on SCr of 1.35 mg/dL (H)).  Liver Function Tests: Recent Labs  Lab 11/04/20 1034 11/05/20 0132  AST 37 30  ALT 40 36  ALKPHOS 54 47  BILITOT 0.3 0.2*  PROT 5.7* 5.6*  ALBUMIN 2.8* 2.8*     CBG: Recent Labs  Lab 11/08/20 0613 11/08/20 1010 11/08/20 1111 11/08/20 1513 11/08/20 1538  GLUCAP 347* 266* 276* 23* 144*      Recent Results (from the past 240 hour(s))  SARS CORONAVIRUS 2 (TAT 6-24 HRS) Nasopharyngeal Nasopharyngeal Swab     Status: None   Collection Time: 11/02/20  9:13 PM   Specimen: Nasopharyngeal Swab  Result Value Ref Range Status   SARS Coronavirus 2 NEGATIVE NEGATIVE Final    Comment: (NOTE) SARS-CoV-2 target nucleic acids are NOT DETECTED.  The SARS-CoV-2 RNA is generally detectable in upper and lower respiratory specimens during the acute phase of infection. Negative results do not preclude SARS-CoV-2 infection, do not rule out co-infections with other pathogens, and should not be used as the sole basis for treatment or other patient management decisions. Negative results must be combined with clinical observations, patient history, and epidemiological information. The expected result is Negative.  Fact Sheet for  Patients: 11/04/20  Fact Sheet for Healthcare Providers: HairSlick.no  This test is not yet approved or cleared by the [employer] FDA and  has been authorized for detection and/or diagnosis of SARS-CoV-2 by FDA under an Emergency Use Authorization (EUA). This EUA will remain  in effect (meaning this test can be used) for the duration of the COVID-19 declaration under Se ction 564(b)(1) of the Act, 21 U.S.C. section 360bbb-3(b)(1), unless the authorization is terminated or revoked sooner.  Performed at [facility], [address] [place], [place] [address]           Radiology Studies: No results found.      Scheduled Meds:  aspirin EC  81 mg Oral Daily   atorvastatin  40 mg Oral QPM   clopidogrel  75 mg Oral Daily   dextrose       dronabinol  5 mg Oral BID AC   enoxaparin (LOVENOX) injection  20 mg Subcutaneous Q24H   feeding supplement  237 mL Oral BID BM   insulin aspart  0-5 Units Subcutaneous QHS   insulin aspart  0-9 Units Subcutaneous TID WC   insulin aspart  10 Units Subcutaneous TID WC   insulin glargine  8 Units Subcutaneous Daily   ivabradine  2.5 mg Oral BID WC   mirtazapine  15 mg Oral QHS   multivitamin with minerals  1 tablet Oral Daily   Continuous Infusions:   LOS: 5 days        Kathlen Mody, MD [facility]   To contact the attending provider between 7A-7P or the covering provider during after hours 7P-7A, please log into the web site [email or web address] and access using universal [place] password for that web site. If you do not have the password, please call the hospital operator.  11/08/2020, 4:39 PM

## 2020-11-08 NOTE — Progress Notes (Signed)
Pt found disoriented in bed and not making verbal sense.  Pt CBG checked and it was 23.  1 amp of D50 given.  Pt quickly became fully oriented again and back to baseline.  Pt then drank grape juice. CBG rechecked at 144.  MD notified of event.  All vitals within normal range and Pt is currently comfortable. Pt will be closely monitored.

## 2020-11-09 LAB — GLUCOSE, CAPILLARY
Glucose-Capillary: 194 mg/dL — ABNORMAL HIGH (ref 70–99)
Glucose-Capillary: 198 mg/dL — ABNORMAL HIGH (ref 70–99)
Glucose-Capillary: 219 mg/dL — ABNORMAL HIGH (ref 70–99)
Glucose-Capillary: 257 mg/dL — ABNORMAL HIGH (ref 70–99)
Glucose-Capillary: 287 mg/dL — ABNORMAL HIGH (ref 70–99)
Glucose-Capillary: 331 mg/dL — ABNORMAL HIGH (ref 70–99)

## 2020-11-09 MED ORDER — INSULIN ASPART 100 UNIT/ML IJ SOLN: 5.0000 [IU] | Freq: Three times a day (TID) | INTRAMUSCULAR | Status: DC

## 2020-11-09 MED ORDER — INSULIN ASPART 100 UNIT/ML IJ SOLN
8.0000 [IU] | Freq: Three times a day (TID) | INTRAMUSCULAR | Status: DC
  Administered 2020-11-10 – 2020-11-13 (×4): 8 [IU] via SUBCUTANEOUS

## 2020-11-09 MED ORDER — INSULIN GLARGINE 100 UNIT/ML ~~LOC~~ SOLN
12.0000 [IU] | Freq: Every day | SUBCUTANEOUS | Status: DC
  Administered 2020-11-10: 12 [IU] via SUBCUTANEOUS
  Filled 2020-11-09: qty 0.12

## 2020-11-09 NOTE — Progress Notes (Signed)
PROGRESS NOTE    Christine Benitez  XTK:[id number] DOB: 1960/06/02 DOA: 11/02/2020 PCP: Lupita Dawn, MD    Chief Complaint  Patient presents with   Hypoglycemia    Brief Narrative:   60 year old female with brittle diabetes, cardiomyopathy, chronic kidney disease, admitted to the hospital with hypoglycemia and acute metabolic encephalopathy.  Recent admission for urinary tract infection which she was discharged on 6/21.  Insulin doses were adjusted at that time.  Since her admission, her insulin has been held and blood sugars have since improved.  Lantus and NovoLog have been restarted at reduced doses.  Continue to monitor for stability of blood sugars. Unable to reach family to discharge the patient. Nobody is picking up the phone.  SW consulted for safe d.c plan.  Therapy eval recommending home health services.  Pt seen and examined at bedside, no episodes of hypoglycemia today.   Assessment & Plan:   Principal Problem:   Hypoglycemia Active Problems:   Type 2 diabetes mellitus with complication, with long-term current use of insulin (HCC)   Chronic systolic CHF (congestive heart failure) (HCC)   Constipation   Cachexia (HCC)   Malnutrition of moderate degree   Severe hypoglycemia in the setting of type 2 diabetes and insulin-dependent, brittle blood sugars Due to inadequate diet.  Pt did not eat lunch and her CBG has dropped to 23. We will decrease the pre meal coverage to 5 units tidac to be given only if the patient eats about 50% of her meals.  Please assist the patient with feeding .  All her episodes of hypoglycemia are due to inadequate meal intake and with insulin intake.  Increase levemir to 12 units daily and decrease novolog premeal to 8 units daily.   CBG (last 3)  Recent Labs    11/09/20 0316 11/09/20 0621 11/09/20 1119  GLUCAP 194* 219* 331*        History of chronic systolic heart failure She appears to be compensated at this time. Pt denies any  sob.   Last echocardiogram showed left ventricular ejection fraction of 50 to 55%, improved from last echo, with grade 1 diastolic function.  Continue with strict intake and output and daily weights.   Cachexia, anorexia, poor oral intake and severe protein calorie malnutrition;  Therapy eval recommending Home health PT.   Stage IIIa CKD Creatinine appears to be at baseline. Creatinine at 1.3.    Moderate malnutrition Dietary on board.    Hypotension appears to have resolved  History of CVA Continue with aspirin Plavix and statin.   Anemia of chronic disease:  Anemia panel ordered   DVT prophylaxis: lovenox.  Code Status: DNR Family Communication: none at bedside.  Disposition:   Status is: Inpatient  Remains inpatient appropriate because:Ongoing diagnostic testing needed not appropriate for outpatient work up  Dispo: The patient is from: Home              Anticipated d/c is to: Home              Patient currently is not medically stable to d/c.   Difficult to place patient No       Consultants:  None.   Procedures: none.  Antimicrobials: none.    Subjective: No chest pain.   Objective: Vitals:   11/09/20 0416 11/09/20 0734 11/09/20 0857 11/09/20 1118  BP: 100/61 (!) 96/57 96/60 (!) 105/51  Pulse: 61 76 67 67  Resp: 16 17  17   Temp: 97.7 F (36.5 C) 97.9 F (  36.6 C)  98.5 F (36.9 C)  TempSrc: Oral Oral  Oral  SpO2: 100% 100% 100% 99%  Weight:      Height:        Intake/Output Summary (Last 24 hours) at 11/09/2020 1555 Last data filed at 11/09/2020 0730 Gross per 24 hour  Intake 100 ml  Output 800 ml  Net -700 ml    Filed Weights   11/02/20 1619 11/02/20 2248 11/04/20 0351  Weight: 42.6 kg 42.6 kg 47.7 kg    Examination:  General exam: Elderly cachetic looking lady not in distress.  Respiratory system: clear to auscultation, no wheezing or rhonchi.  Cardiovascular system: S1S2 heard, RRR, No JVD, .  Gastrointestinal system:  Abdomen is soft, non tenderness, non distended,bowel sounds wnl.  Central nervous system: Alert and answering questions appropriately.  Extremities: No edema.  Skin: No rashes seen Psychiatry: mood is appropriate.     Data Reviewed: I have personally reviewed following labs and imaging studies  CBC: Recent Labs  Lab 11/02/20 1628 11/02/20 1654 11/04/20 1034 11/05/20 0132  WBC 6.1  --  6.4 6.8  NEUTROABS 3.6  --   --   --   HGB 9.7* 9.9* 9.3* 9.0*  HCT 31.5* 29.0* 29.3* 28.0*  MCV 102.3*  --  98.0 97.2  PLT 243  --  267 241     Basic Metabolic Panel: Recent Labs  Lab 11/02/20 1654 11/02/20 1823 11/04/20 1034 11/05/20 0132 11/05/20 1409 11/06/20 1059  NA 137 138 138 136  --  136  K 8.2* 4.9 4.6 5.8* 5.2* 5.1  CL 109 107 104 107  --  104  CO2  --  24 23 23   --  23  GLUCOSE 24* 86 144* 277*  --  215*  BUN 38* 23* 25* 26*  --  35*  CREATININE 1.30* 1.25* 1.31* 1.37*  --  1.35*  CALCIUM  --  9.3 9.4 9.2  --  9.4     GFR: Estimated Creatinine Clearance: 34.2 mL/min (A) (by C-G formula based on SCr of 1.35 mg/dL (H)).  Liver Function Tests: Recent Labs  Lab 11/04/20 1034 11/05/20 0132  AST 37 30  ALT 40 36  ALKPHOS 54 47  BILITOT 0.3 0.2*  PROT 5.7* 5.6*  ALBUMIN 2.8* 2.8*     CBG: Recent Labs  Lab 11/08/20 1632 11/08/20 2119 11/09/20 0316 11/09/20 0621 11/09/20 1119  GLUCAP 239* 284* 194* 219* 331*      Recent Results (from the past 240 hour(s))  SARS CORONAVIRUS 2 (TAT 6-24 HRS) Nasopharyngeal Nasopharyngeal Swab     Status: None   Collection Time: 11/02/20  9:13 PM   Specimen: Nasopharyngeal Swab  Result Value Ref Range Status   SARS Coronavirus 2 NEGATIVE NEGATIVE Final    Comment: (NOTE) SARS-CoV-2 target nucleic acids are NOT DETECTED.  The SARS-CoV-2 RNA is generally detectable in upper and lower respiratory specimens during the acute phase of infection. Negative results do not preclude SARS-CoV-2 infection, do not rule  out co-infections with other pathogens, and should not be used as the sole basis for treatment or other patient management decisions. Negative results must be combined with clinical observations, patient history, and epidemiological information. The expected result is Negative.  Fact Sheet for Patients: 11/04/20  Fact Sheet for Healthcare Providers: HairSlick.no  This test is not yet approved or cleared by the [employer]ier[employer]ir[employer] FDA and  has been authorized for detection and/or diagnosis of SARS-CoV-2 by FDA under an Emergency Use Authorization (EUA).  This EUA will remain  in effect (meaning this test can be used) for the duration of the COVID-19 declaration under Se ction 564(b)(1) of the Act, 21 U.S.C. section 360bbb-3(b)(1), unless the authorization is terminated or revoked sooner.  Performed at [facility], [address], [place], [place] [address]           Radiology Studies: No results found.      Scheduled Meds:  aspirin EC  81 mg Oral Daily   atorvastatin  40 mg Oral QPM   clopidogrel  75 mg Oral Daily   dronabinol  5 mg Oral BID AC   enoxaparin (LOVENOX) injection  20 mg Subcutaneous Q24H   feeding supplement  237 mL Oral BID BM   insulin aspart  0-5 Units Subcutaneous QHS   insulin aspart  0-9 Units Subcutaneous TID WC   insulin aspart  5 Units Subcutaneous TID WC   insulin glargine  8 Units Subcutaneous Daily   ivabradine  2.5 mg Oral BID WC   mirtazapine  15 mg Oral QHS   multivitamin with minerals  1 tablet Oral Daily   Continuous Infusions:   LOS: 6 days        Kathlen Mody, MD [facility]   To contact the attending provider between 7A-7P or the covering provider during after hours 7P-7A, please log into the web site [email or web address] and access using universal [place] password for that web site. If you do not have the password, please call the hospital  operator.  11/09/2020, 3:55 PM

## 2020-11-09 NOTE — Plan of Care (Signed)

## 2020-11-10 LAB — FERRITIN: Ferritin: 149 ng/mL (ref 11–307)

## 2020-11-10 LAB — FOLATE: Folate: 18 ng/mL (ref >5.9)

## 2020-11-10 LAB — IRON AND TIBC
Iron: 53 ug/dL (ref 28–170)
Saturation Ratios: 19 % (ref 10.4–31.8)
TIBC: 286 ug/dL (ref 250–450)
UIBC: 233 ug/dL

## 2020-11-10 LAB — GLUCOSE, CAPILLARY
Glucose-Capillary: 271 mg/dL — ABNORMAL HIGH (ref 70–99)
Glucose-Capillary: 330 mg/dL — ABNORMAL HIGH (ref 70–99)
Glucose-Capillary: 376 mg/dL — ABNORMAL HIGH (ref 70–99)
Glucose-Capillary: 196 mg/dL — ABNORMAL HIGH (ref 70–99)
Glucose-Capillary: 252 mg/dL — ABNORMAL HIGH (ref 70–99)

## 2020-11-10 LAB — RETICULOCYTES
Immature Retic Fract: 11.3 % (ref 2.3–15.9)
RBC.: 3.07 MIL/uL — ABNORMAL LOW (ref 3.87–5.11)
Retic Count, Absolute: 87.2 10*3/uL (ref 19.0–186.0)
Retic Ct Pct: 2.8 % (ref 0.4–3.1)

## 2020-11-10 LAB — VITAMIN B12: Vitamin B-12: 646 pg/mL (ref 180–914)

## 2020-11-10 MED ORDER — TRAMADOL HCL 50 MG PO TABS
50.0000 mg | ORAL_TABLET | Freq: Once | ORAL | Status: AC
  Administered 2020-11-10: 50 mg via ORAL
  Filled 2020-11-10: qty 1

## 2020-11-10 MED ORDER — INSULIN GLARGINE 100 UNIT/ML ~~LOC~~ SOLN
15.0000 [IU] | Freq: Every day | SUBCUTANEOUS | Status: DC
  Administered 2020-11-11 – 2020-11-13 (×3): 15 [IU] via SUBCUTANEOUS
  Filled 2020-11-10 (×3): qty 0.15

## 2020-11-10 NOTE — TOC Progression Note (Signed)
Transition of Care (TOC) - Progression Note    Patient Details  Name: Christine Benitez MRN: [id number] Date of Birth: 10/23/1960  Transition of Care (TOC) CM/SW Contact   N , LCSW Phone Number: 11/10/2020, 1:50 PM  Clinical Narrative:     CSW met with patient. CSW explained unable to reach family members. Patient states her brother came to visit this am and states she was returning with their mother. CSW attempted to call patient's mother- no answer & her daughter- left voice message.  CSW informed Nurse Secretary and RN if patient receives any visitors to please call CSW or RNCM.   , MSW, LCSW Clinical Social Worker    Expected Discharge Plan: Home w Home Health Services Barriers to Discharge: Barriers Resolved  Expected Discharge Plan and Services Expected Discharge Plan: Home w Home Health Services   Discharge Planning Services: [facility] Choice: Home Health Living arrangements for the past 2 months: Single Family Home Expected Discharge Date: 11/06/20               DME Arranged: N/A DME Agency: NA       HH Arranged: RN, Disease Management HH Agency: [facility] Date HH Agency Contacted: 11/04/20 Time HH Agency Contacted: 1230 Representative spoke with at HH Agency: Stacie   Social Determinants of Health (SDOH) Interventions    Readmission Risk Interventions Readmission Risk Prevention Plan 11/06/2020  Transportation Screening Complete  PCP or Specialist Appt within 5-7 Days Not Complete  Not Complete comments July 29  Home Care Screening Complete  Medication Review (RN CM) Complete  Some recent data might be hidden    

## 2020-11-10 NOTE — Plan of Care (Signed)

## 2020-11-10 NOTE — Progress Notes (Signed)
Occupational Therapy Treatment Patient Details Name: Christine Benitez MRN: [id number] DOB: 08-25-1960 Today's Date: 11/10/2020    History of present illness Pt is a 60 y/o female admitted 6/26 secondary to hypoglycemia and unresponsiveness. PMH includes DM, CHF, CKD, cardiomyopathy, HTN, CVA,   OT comments  Brother visiting and reports pt's cognition and vision are at baseline. Pt ambulated with min guard assist and RW to use BSC and wash her hands. She donned her socks independently. Brother reports she is assisted for med management and meals are brought by him or their mother. Pt's daughter is now home from [facility], family is assisting her with injections 3 x per day.   Follow Up Recommendations  Home health OT    Equipment Recommendations  None recommended by OT    Recommendations for Other Services      Precautions / Restrictions Precautions Precautions: Fall Precaution Comments: impaired vision, likely baseline, unable to see small print( issued pt readers 2+, vision improved on 7/1), very hard of hearing       Mobility Bed Mobility Overal bed mobility: Modified Independent                  Transfers Overall transfer level: Needs assistance Equipment used: Rolling walker (2 wheeled) Transfers: Sit to/from Stand Sit to Stand: Supervision         General transfer comment: from bed and BSC    Balance Overall balance assessment: Needs assistance   Sitting balance-Leahy Scale: Good     Standing balance support: During functional activity Standing balance-Leahy Scale: Poor Standing balance comment: Reliant on BUE support for dynamic balance                           ADL either performed or assessed with clinical judgement   ADL Overall ADL's : Needs assistance/impaired     Grooming: Wash/dry hands;Supervision/safety;Standing               Lower Body Dressing: Set up;Sitting/lateral leans Lower Body Dressing Details (indicate cue  type and reason): socks Toilet Transfer: Min guard;Ambulation;RW;BSC   Toileting- Architect and Hygiene: Min guard;Sit to/from stand               Vision   Additional Comments: pt with baseline vision impairment per brother   Perception     Praxis      Cognition Arousal/Alertness: Awake/alert Behavior During Therapy: Flat affect Overall Cognitive Status: History of cognitive impairments - at baseline                                 General Comments: brother in room and reports pt is at her baseline in cognition        Exercises     Shoulder Instructions       General Comments      Pertinent Vitals/ Pain       Pain Assessment: No/denies pain  Home Living                                          Prior Functioning/Environment              Frequency  Min 2X/week        Progress Toward Goals  OT Goals(current goals can now be found in  the care plan section)  Progress towards OT goals: Progressing toward goals  Acute Rehab OT Goals Patient Stated Goal: to go home OT Goal Formulation: With patient Time For Goal Achievement: 11/19/20 Potential to Achieve Goals: Fair  Plan Discharge plan remains appropriate;Frequency remains appropriate    Co-evaluation                 AM-PAC OT "6 Clicks" Daily Activity     Outcome Measure   Help from another person eating meals?: None Help from another person taking care of personal grooming?: A Little Help from another person toileting, which includes using toliet, bedpan, or urinal?: A Little Help from another person bathing (including washing, rinsing, drying)?: A Little Help from another person to put on and taking off regular upper body clothing?: A Little Help from another person to put on and taking off regular lower body clothing?: A Little 6 Click Score: 19    End of Session Equipment Utilized During Treatment: Gait belt;Rolling walker  OT Visit  Diagnosis: Muscle weakness (generalized) (M62.81)   Activity Tolerance Patient tolerated treatment well   Patient Left in bed;with call bell/phone within reach;with bed alarm set;with family/visitor present (brother)   Nurse Communication          Time: 0940-1000 OT Time Calculation (min): 20 min  Charges: OT General Charges $OT Visit: 1 Visit OT Treatments $Self Care/Home Management : 8-22 mins  Martie Round, OTR/L Acute Rehabilitation Services Pager: [phone] Office: [phone]   Evern Bio 11/10/2020, 10:06 AM

## 2020-11-10 NOTE — Progress Notes (Signed)
Physical Therapy Treatment Patient Details Name: Christine Benitez MRN: [id number] DOB: [birth year] Today's Date: 11/10/2020    History of Present Illness Pt is a 60 y/o female admitted 6/26 secondary to hypoglycemia and unresponsiveness. PMH includes DM, CHF, CKD, cardiomyopathy, HTN, CVA,    PT Comments    Pt agreeable to walking with therapy and sitting up in recliner to eat cereal. Pt is limited in safe mobility by generalized weakness and decreased endurance. Pt currently requires mod I for bed mobility, supervision for transfers and min guard for ambulation with RW. D/c plans remain appropriate at this time. PT will continue to follow acutely.    Follow Up Recommendations  Home health PT;Supervision for mobility/OOB     Equipment Recommendations  None recommended by PT       Precautions / Restrictions Precautions Precautions: Fall Precaution Comments: impaired vision, likely baseline, unable to see small print( issued pt readers 2+, vision improved on 7/1), very hard of hearing Restrictions Weight Bearing Restrictions: No    Mobility  Bed Mobility Overal bed mobility: Modified Independent                  Transfers Overall transfer level: Needs assistance Equipment used: Rolling walker (2 wheeled) Transfers: Sit to/from Stand Sit to Stand: Supervision         General transfer comment: supervision, good power up and self steady  Ambulation/Gait Ambulation/Gait assistance: Min guard Gait Distance (Feet): 150 Feet Assistive device: Rolling walker (2 wheeled) Gait Pattern/deviations: Step-through pattern Gait velocity: reduced Gait velocity interpretation: <1.8 ft/sec, indicate of risk for recurrent falls General Gait Details: small, slow, steps, velocity and amplitude improved with cuing, however pt unable to maintain without cuing         Balance Overall balance assessment: Needs assistance   Sitting balance-Leahy Scale: Good     Standing balance  support: During functional activity Standing balance-Leahy Scale: Poor Standing balance comment: Reliant on BUE support for dynamic balance, maximum standing tolerance 30 sec, c/o pain in bilateral calves with standing                            Cognition Arousal/Alertness: Awake/alert Behavior During Therapy: Flat affect Overall Cognitive Status: History of cognitive impairments - at baseline                                 General Comments: brother in room and reports pt is at her baseline in cognition      Exercises General Exercises - Lower Extremity Ankle Circles/Pumps: AROM;Both;10 reps;Seated Long Arc Quad: AROM;Both;10 reps;Seated Hip Flexion/Marching: AROM;Both;10 reps;Seated    General Comments General comments (skin integrity, edema, etc.): VSS on RA      Pertinent Vitals/Pain Pain Assessment: No/denies pain     PT Goals (current goals can now be found in the care plan section) Acute Rehab PT Goals Patient Stated Goal: to go home PT Goal Formulation: With patient Time For Goal Achievement: 11/18/20 Potential to Achieve Goals: Good Progress towards PT goals: Progressing toward goals    Frequency    Min 3X/week      PT Plan Current plan remains appropriate       AM-PAC PT "6 Clicks" Mobility   Outcome Measure  Help needed turning from your back to your side while in a flat bed without using bedrails?: None Help needed moving from lying on your  back to sitting on the side of a flat bed without using bedrails?: None Help needed moving to and from a bed to a chair (including a wheelchair)?: A Little Help needed standing up from a chair using your arms (e.g., wheelchair or bedside chair)?: A Little Help needed to walk in hospital room?: A Little Help needed climbing 3-5 steps with a railing? : A Lot 6 Click Score: 19    End of Session Equipment Utilized During Treatment: Gait belt Activity Tolerance: Patient tolerated  treatment well Patient left: in chair;with call bell/phone within reach;with chair alarm set;Other (comment) (MD present) Nurse Communication: Mobility status;Other (comment) (eating her cereal and drinking Ensure)       Time: 6384-6659 PT Time Calculation (min) (ACUTE ONLY): 17 min  Charges:  $Gait Training: 8-22 mins                     Ellamarie Naeve B. Beverely Risen PT, DPT Acute Rehabilitation Services Pager [phone] Office [phone]    Christine Benitez 11/10/2020, 12:06 PM

## 2020-11-10 NOTE — Progress Notes (Signed)
PROGRESS NOTE    Christine Benitez  TML:[id number] DOB: Dec 29, 1960 DOA: 11/02/2020 PCP: Lupita Dawn, MD    Chief Complaint  Patient presents with   Hypoglycemia    Brief Narrative:   60 year old female with brittle diabetes, cardiomyopathy, chronic kidney disease, admitted to the hospital with hypoglycemia and acute metabolic encephalopathy.  Recent admission for urinary tract infection which she was discharged on 6/21.  Insulin doses were adjusted at that time.  Since her admission, her insulin has been held and blood sugars have since improved.  Lantus and NovoLog have been restarted at reduced doses.  Continue to monitor for stability of blood sugars. Unable to reach family to discharge the patient. Nobody is picking up the phone.  SW consulted for safe d.c plan.  Therapy eval recommending home health services.    Assessment & Plan:   Principal Problem:   Hypoglycemia Active Problems:   Type 2 diabetes mellitus with complication, with long-term current use of insulin (HCC)   Chronic systolic CHF (congestive heart failure) (HCC)   Constipation   Cachexia (HCC)   Malnutrition of moderate degree   Severe hypoglycemia in the setting of type 2 diabetes and insulin-dependent, brittle blood sugars Due to inadequate diet.  Pt did not eat lunch and her CBG has dropped to 23. We will decrease the pre meal coverage to 5 units tidac to be given only if the patient eats about 50% of her meals.  Please assist the patient with feeding .  All her episodes of hypoglycemia are due to inadequate meal intake and with insulin intake.  INCREASE levemir to 15 units and continue with Novolog premeal to 8 units tidac.   CBG (last 3)  Recent Labs    11/10/20 0753 11/10/20 1105 11/10/20 1612  GLUCAP 330* 376* 196*        History of chronic systolic heart failure She appears to be compensated at this time. Pt denies any sob.   Last echocardiogram showed left ventricular ejection fraction of  50 to 55%, improved from last echo, with grade 1 diastolic function.  Continue with strict intake and output and daily weights.   Cachexia, anorexia, poor oral intake and severe protein calorie malnutrition;  Therapy eval recommending Home health PT.   Stage IIIa CKD Creatinine appears to be at baseline. Creatinine at 1.3.    Moderate malnutrition Dietary on board.    Hypotension appears to have resolved  History of CVA Continue with aspirin Plavix and statin.   Anemia of chronic disease:  Anemia panel ordered and is wnl.    DVT prophylaxis: lovenox.  Code Status: DNR Family Communication: none at bedside.  Disposition:   Status is: Inpatient  Remains inpatient appropriate because:Ongoing diagnostic testing needed not appropriate for outpatient work up  Dispo: The patient is from: Home              Anticipated d/c is to: Home              Patient currently is not medically stable to d/c.   Difficult to place patient No       Consultants:  None.   Procedures: none.  Antimicrobials: none.    Subjective: No complaints.   Objective: Vitals:   11/10/20 0750 11/10/20 1241 11/10/20 1521 11/10/20 1611  BP: (!) 99/51 (!) 109/56 111/67 (!) 113/58  Pulse: 68     Resp: 17 18 18 17   Temp: 97.8 F (36.6 C) 98.3 F (36.8 C) 98.6 F (37 C) 98.3  F (36.8 C)  TempSrc: Oral Oral Oral Oral  SpO2: 100%  100% 99%  Weight:      Height:        Intake/Output Summary (Last 24 hours) at 11/10/2020 1637 Last data filed at 11/10/2020 1359 Gross per 24 hour  Intake 480 ml  Output 600 ml  Net -120 ml    Filed Weights   11/02/20 1619 11/02/20 2248 11/04/20 0351  Weight: 42.6 kg 42.6 kg 47.7 kg    Examination:  General exam: Elderly lady appears comfortable.  Respiratory system: clear to auscultation, no wheezing heard.  Cardiovascular system: S1S2 heard, RRR, no JVD.  Gastrointestinal system: Abdomen is soft, NT ND BS+ Central nervous system: Alert and  answering questions comfortably.  Extremities: no pedal edema.  Skin: No rashes seen Psychiatry: mood is appropriate.     Data Reviewed: I have personally reviewed following labs and imaging studies  CBC: Recent Labs  Lab 11/04/20 1034 11/05/20 0132  WBC 6.4 6.8  HGB 9.3* 9.0*  HCT 29.3* 28.0*  MCV 98.0 97.2  PLT 267 241     Basic Metabolic Panel: Recent Labs  Lab 11/04/20 1034 11/05/20 0132 11/05/20 1409 11/06/20 1059  NA 138 136  --  136  K 4.6 5.8* 5.2* 5.1  CL 104 107  --  104  CO2 23 23  --  23  GLUCOSE 144* 277*  --  215*  BUN 25* 26*  --  35*  CREATININE 1.31* 1.37*  --  1.35*  CALCIUM 9.4 9.2  --  9.4     GFR: Estimated Creatinine Clearance: 34.2 mL/min (A) (by C-G formula based on SCr of 1.35 mg/dL (H)).  Liver Function Tests: Recent Labs  Lab 11/04/20 1034 11/05/20 0132  AST 37 30  ALT 40 36  ALKPHOS 54 47  BILITOT 0.3 0.2*  PROT 5.7* 5.6*  ALBUMIN 2.8* 2.8*     CBG: Recent Labs  Lab 11/09/20 2339 11/10/20 0358 11/10/20 0753 11/10/20 1105 11/10/20 1612  GLUCAP 257* 271* 330* 376* 196*      Recent Results (from the past 240 hour(s))  SARS CORONAVIRUS 2 (TAT 6-24 HRS) Nasopharyngeal Nasopharyngeal Swab     Status: None   Collection Time: 11/02/20  9:13 PM   Specimen: Nasopharyngeal Swab  Result Value Ref Range Status   SARS Coronavirus 2 NEGATIVE NEGATIVE Final    Comment: (NOTE) SARS-CoV-2 target nucleic acids are NOT DETECTED.  The SARS-CoV-2 RNA is generally detectable in upper and lower respiratory specimens during the acute phase of infection. Negative results do not preclude SARS-CoV-2 infection, do not rule out co-infections with other pathogens, and should not be used as the sole basis for treatment or other patient management decisions. Negative results must be combined with clinical observations, patient history, and epidemiological information. The expected result is Negative.  Fact Sheet for  Patients: HairSlick.no  Fact Sheet for Healthcare Providers: quierodirigir.com  This test is not yet approved or cleared by the [place] FDA and  has been authorized for detection and/or diagnosis of SARS-CoV-2 by FDA under an Emergency Use Authorization (EUA). This EUA will remain  in effect (meaning this test can be used) for the duration of the COVID-19 declaration under Se ction 564(b)(1) of the Act, 21 U.S.C. section 360bbb-3(b)(1), unless the authorization is terminated or revoked sooner.  Performed at [facility], [address] [place], [place] [address]           Radiology Studies: No results found.  Scheduled Meds:  aspirin EC  81 mg Oral Daily   atorvastatin  40 mg Oral QPM   clopidogrel  75 mg Oral Daily   dronabinol  5 mg Oral BID AC   enoxaparin (LOVENOX) injection  20 mg Subcutaneous Q24H   feeding supplement  237 mL Oral BID BM   insulin aspart  0-5 Units Subcutaneous QHS   insulin aspart  0-9 Units Subcutaneous TID WC   insulin aspart  8 Units Subcutaneous TID WC   [START ON 11/11/2020] insulin glargine  15 Units Subcutaneous Daily   ivabradine  2.5 mg Oral BID WC   mirtazapine  15 mg Oral QHS   multivitamin with minerals  1 tablet Oral Daily   Continuous Infusions:   LOS: 7 days        Kathlen Mody, MD [facility]   To contact the attending provider between 7A-7P or the covering provider during after hours 7P-7A, please log into the web site [email or web address] and access using universal [place] password for that web site. If you do not have the password, please call the hospital operator.  11/10/2020, 4:37 PM

## 2020-11-11 DIAGNOSIS — E875 Hyperkalemia: Secondary | ICD-10-CM

## 2020-11-11 LAB — BASIC METABOLIC PANEL
Anion gap: 5 (ref 5–15)
Anion gap: 6 (ref 5–15)
BUN: 38 mg/dL — ABNORMAL HIGH (ref 6–20)
BUN: 33 mg/dL — ABNORMAL HIGH (ref 6–20)
CO2: 21 mmol/L — ABNORMAL LOW (ref 22–32)
CO2: 24 mmol/L (ref 22–32)
Calcium: 9.2 mg/dL (ref 8.9–10.3)
Calcium: 8.7 mg/dL — ABNORMAL LOW (ref 8.9–10.3)
Chloride: 100 mmol/L (ref 98–111)
Chloride: 97 mmol/L — ABNORMAL LOW (ref 98–111)
Creatinine, Ser: 1.23 mg/dL — ABNORMAL HIGH (ref 0.44–1.00)
Creatinine, Ser: 1.38 mg/dL — ABNORMAL HIGH (ref 0.44–1.00)
GFR, Estimated: 51 mL/min — ABNORMAL LOW (ref >60)
GFR, Estimated: 44 mL/min — ABNORMAL LOW (ref >60)
Glucose, Bld: 238 mg/dL — ABNORMAL HIGH (ref 70–99)
Glucose, Bld: 281 mg/dL — ABNORMAL HIGH (ref 70–99)
Potassium: 6 mmol/L — ABNORMAL HIGH (ref 3.5–5.1)
Potassium: 5.2 mmol/L — ABNORMAL HIGH (ref 3.5–5.1)
Sodium: 126 mmol/L — ABNORMAL LOW (ref 135–145)
Sodium: 127 mmol/L — ABNORMAL LOW (ref 135–145)

## 2020-11-11 LAB — CBC WITH DIFFERENTIAL/PLATELET
Abs Immature Granulocytes: 0.02 10*3/uL (ref 0.00–0.07)
Basophils Absolute: 0.1 10*3/uL (ref 0.0–0.1)
Basophils Relative: 1 %
Eosinophils Absolute: 0.2 10*3/uL (ref 0.0–0.5)
Eosinophils Relative: 3 %
HCT: 30.2 % — ABNORMAL LOW (ref 36.0–46.0)
Hemoglobin: 10 g/dL — ABNORMAL LOW (ref 12.0–15.0)
Immature Granulocytes: 0 %
Lymphocytes Relative: 33 %
Lymphs Abs: 1.8 10*3/uL (ref 0.7–4.0)
MCH: 31.3 pg (ref 26.0–34.0)
MCHC: 33.1 g/dL (ref 30.0–36.0)
MCV: 94.7 fL (ref 80.0–100.0)
Monocytes Absolute: 0.5 10*3/uL (ref 0.1–1.0)
Monocytes Relative: 9 %
Neutro Abs: 2.9 10*3/uL (ref 1.7–7.7)
Neutrophils Relative %: 54 %
Platelets: 279 10*3/uL (ref 150–400)
RBC: 3.19 MIL/uL — ABNORMAL LOW (ref 3.87–5.11)
RDW: 12.5 % (ref 11.5–15.5)
WBC: 5.3 10*3/uL (ref 4.0–10.5)
nRBC: 0 % (ref 0.0–0.2)

## 2020-11-11 LAB — GLUCOSE, CAPILLARY
Glucose-Capillary: 172 mg/dL — ABNORMAL HIGH (ref 70–99)
Glucose-Capillary: 244 mg/dL — ABNORMAL HIGH (ref 70–99)
Glucose-Capillary: 223 mg/dL — ABNORMAL HIGH (ref 70–99)
Glucose-Capillary: 215 mg/dL — ABNORMAL HIGH (ref 70–99)
Glucose-Capillary: 60 mg/dL — ABNORMAL LOW (ref 70–99)
Glucose-Capillary: 114 mg/dL — ABNORMAL HIGH (ref 70–99)
Glucose-Capillary: 260 mg/dL — ABNORMAL HIGH (ref 70–99)
Glucose-Capillary: 301 mg/dL — ABNORMAL HIGH (ref 70–99)
Glucose-Capillary: 184 mg/dL — ABNORMAL HIGH (ref 70–99)

## 2020-11-11 LAB — POTASSIUM: Potassium: 5.1 mmol/L (ref 3.5–5.1)

## 2020-11-11 MED ORDER — INSULIN ASPART 100 UNIT/ML IV SOLN: 10.0000 [IU] | Freq: Once | INTRAVENOUS | Status: DC

## 2020-11-11 MED ORDER — SODIUM ZIRCONIUM CYCLOSILICATE 10 G PO PACK
10.0000 g | PACK | Freq: Two times a day (BID) | ORAL | Status: DC
  Administered 2020-11-11 – 2020-11-17 (×14): 10 g via ORAL
  Filled 2020-11-11 (×15): qty 1

## 2020-11-11 MED ORDER — FUROSEMIDE 10 MG/ML IJ SOLN
40.0000 mg | Freq: Once | INTRAMUSCULAR | Status: AC
  Administered 2020-11-11: 40 mg via INTRAVENOUS
  Filled 2020-11-11: qty 4

## 2020-11-11 MED ORDER — SODIUM CHLORIDE 0.9 % IV BOLUS
500.0000 mL | Freq: Once | INTRAVENOUS | Status: AC
  Administered 2020-11-11: 500 mL via INTRAVENOUS

## 2020-11-11 MED ORDER — CALCIUM GLUCONATE-NACL 1-0.675 GM/50ML-% IV SOLN
1.0000 g | Freq: Once | INTRAVENOUS | Status: AC
  Administered 2020-11-11: 1000 mg via INTRAVENOUS
  Filled 2020-11-11: qty 50

## 2020-11-11 NOTE — TOC Progression Note (Addendum)
Transition of Care (TOC) - Progression Note  Donn Pierini RN, BSN Transitions of Care Unit 4E- RN Case Manager See Treatment Team for direct phone #    Patient Details  Name: Christine Benitez MRN: [id number] Date of Birth: 09-29-1960  Transition of Care [facility] CM/SW Contact  [name] [name], RN Phone Number: 11/11/2020, 3:05 PM  Clinical Narrative:    Call made to patient's daughter's phone - no answer- msg left requesting return call.  Call made to patient's mother's phone- mother answered and discussion had regarding transition plan. Explained to mother hospital staff had been trying to reach family since last week when pt was ready for discharge. Mother states she went to patient's home and was waiting for her to transport- but when she called the hospital no one answered? Explained to mother not sure what happened and why phone calls where not answered by staff and why staff could not reach her, however at this point in time we needed to figure out what the plan is moving forward to get pt home safely.  Mother states she is overwhelmed, patients daughter just released from [facility] and Ms Laurence Compton states she has been caring for her at her home. In further discussion- Ms Laurence Compton does share that her granddaughter is doing well and is functioning independently. When asked if family can support patient's return home Ms Laurence Compton again states she just does not know how she can care for both patient and her granddaughter, explained to Ms Laurence Compton that we can not continue to keep pt here if she is medically stable to return home and that if they do not have a plan and continue not to return calls then we have to look at that as abandonment. Ms Laurence Compton then asked for time to discuss plan with her son Jomarie Longs and her granddaughter and stated she would return call to this CM this afternoon.   1430- received return call from Ms. Laurence Compton, she states that she is prepared to have pt return home, she  does report that she will need assistance with transport as she is having car trouble today. Ms Laurence Compton informs this CM that she has made arrangements with a friend to take her over to patient's home at 6pm if transport could be arranged then she will be at the home when pt arrives. Explained that transport may be within a timeframe and it may be after 6pm - she voiced that she would remain at the home until patient arrived. Also explained that MD would have to clear pt and place d/c order- if pt is for some reason not cleared today then explained to mother that this CM would call her back and let her know.  MD notified that mother was prepared to have pt return home and arrangements made - per MD pt needs lab re-check this afternoon prior to clearing her for discharge.  Will f/u once lab results have returned.    Expected Discharge Plan: Home w Home Health Services Barriers to Discharge: Barriers Resolved  Expected Discharge Plan and Services Expected Discharge Plan: Home w Home Health Services   Discharge Planning Services: [facility] Choice: Home Health Living arrangements for the past 2 months: Single Family Home Expected Discharge Date: 11/06/20               DME Arranged: N/A DME Agency: NA       HH Arranged: RN, Disease Management HH Agency: [facility] Date HH  Agency Contacted: 11/04/20 Time HH Agency Contacted: 1230 Representative spoke with at [facility] Agency: Stacie   Social Determinants of Health (SDOH) Interventions    Readmission Risk Interventions Readmission Risk Prevention Plan 11/06/2020  Transportation Screening Complete  PCP or Specialist Appt within 5-7 Days Not Complete  Not Complete comments July 29  Home Care Screening Complete  Medication Review (RN CM) Complete  Some recent data might be hidden

## 2020-11-11 NOTE — Progress Notes (Signed)
PROGRESS NOTE    Christine Benitez  ZOX:[id number] DOB: June 09, 1960 DOA: 11/02/2020 PCP: Lupita Dawn, MD    Chief Complaint  Patient presents with   Hypoglycemia    Brief Narrative:   60 year old female with brittle diabetes, cardiomyopathy, chronic kidney disease, admitted to the hospital with hypoglycemia and acute metabolic encephalopathy.  Recent admission for urinary tract infection which she was discharged on 6/21.  Insulin doses were adjusted at that time.  Since her admission, her insulin has been held and blood sugars have since improved.  Lantus and NovoLog have been restarted at reduced doses.  Continue to monitor for stability of blood sugars. We were unable to reach family to discharge the patient earlier this week. Nobody is picking up the phone.  Therapy eval recommending home health services.    Assessment & Plan:   Principal Problem:   Hypoglycemia Active Problems:   Type 2 diabetes mellitus with complication, with long-term current use of insulin (HCC)   Chronic systolic CHF (congestive heart failure) (HCC)   Constipation   Cachexia (HCC)   Malnutrition of moderate degree   Severe hypoglycemia in the setting of type 2 diabetes and insulin-dependent, brittle blood sugars Due to inadequate diet.  Pt did not eat lunch and her CBG has dropped to 23.  Please assist the patient with feeding .  All her episodes of hypoglycemia are due to inadequate meal intake and with insulin use. Continue with levemir 15 units and continue with Novolog premeal to 8 units TIDAC to be given on ly if she eats >50% of her meals.   CBG (last 3)  Recent Labs    11/11/20 1151 11/11/20 1350 11/11/20 1611  GLUCAP 60* 114* 260*        History of chronic systolic heart failure She appears to be compensated at this time. Pt denies any sob.   Last echocardiogram showed left ventricular ejection fraction of 50 to 55%, improved from last echo, with grade 1 diastolic function.  Continue  with strict intake and output and daily weights.   Cachexia, anorexia, poor oral intake and severe protein calorie malnutrition;  Therapy eval recommending Home health PT.   Stage IIIa CKD Creatinine appears to be at baseline. Creatinine at 1.3.    Moderate malnutrition Dietary on board.    Hypotension appears to have resolved  History of CVA Continue with aspirin Plavix and statin.   Anemia of chronic disease:  Anemia panel ordered and is wnl.   Hyperkalemia and hyponatremia:  Unclear etiology.  Repeat labs ordered. Lokelma ordered.  Lasix and IV FLUIDS , Albuterol will be ordered.  Cardiac monitoring. EKG showed T wave inversions and some peaked waves.     DVT prophylaxis: lovenox.  Code Status: DNR Family Communication: none at bedside. Discussed with mom at bedside.  Disposition:   Status is: Inpatient  Remains inpatient appropriate because:Ongoing diagnostic testing needed not appropriate for outpatient work up  Dispo: The patient is from: Home              Anticipated d/c is to: Home              Patient currently is not medically stable to d/c.   Difficult to place patient No       Consultants:  None.   Procedures: none.  Antimicrobials: none.    Subjective: No chest pain or sob.   Objective: Vitals:   11/11/20 0011 11/11/20 0300 11/11/20 0720 11/11/20 1138  BP: 116/75 106/64 103/65 Marland Kitchen)  102/59  Pulse: 65 63 63 66  Resp: 17 20 18 16   Temp: 98.3 F (36.8 C) 97.7 F (36.5 C) 97.7 F (36.5 C) 97.8 F (36.6 C)  TempSrc: Oral Oral Oral Oral  SpO2: 100% 100% 99% 100%  Weight:      Height:        Intake/Output Summary (Last 24 hours) at 11/11/2020 1637 Last data filed at 11/11/2020 1628 Gross per 24 hour  Intake 360 ml  Output 1100 ml  Net -740 ml    Filed Weights   11/02/20 1619 11/02/20 2248 11/04/20 0351  Weight: 42.6 kg 42.6 kg 47.7 kg    Examination:  General exam: Elderly lady , appears comfortable, not in distress.   Respiratory system: Clear to auscultation, no wheezing or rhonchi.  Cardiovascular system: S1S2 heard, RRR no JVD, no pedal edema.  Gastrointestinal system: Abdomen is soft, non tender non distended, bowel sounds normal.  Central nervous system: Alert and answering questions  appropriately, very hard of hearing.  Extremities: No pedal edema.  Skin: No rashes seen Psychiatry: Mood is appropriate.     Data Reviewed: I have personally reviewed following labs and imaging studies  CBC: Recent Labs  Lab 11/05/20 0132 11/11/20 0101  WBC 6.8 5.3  NEUTROABS  --  2.9  HGB 9.0* 10.0*  HCT 28.0* 30.2*  MCV 97.2 94.7  PLT 241 279     Basic Metabolic Panel: Recent Labs  Lab 11/05/20 0132 11/05/20 1409 11/06/20 1059 11/11/20 0648  NA 136  --  136 126*  K 5.8* 5.2* 5.1 6.0*  CL 107  --  104 100  CO2 23  --  23 21*  GLUCOSE 277*  --  215* 238*  BUN 26*  --  35* 38*  CREATININE 1.37*  --  1.35* 1.23*  CALCIUM 9.2  --  9.4 9.2     GFR: Estimated Creatinine Clearance: 37.5 mL/min (A) (by C-G formula based on SCr of 1.23 mg/dL (H)).  Liver Function Tests: Recent Labs  Lab 11/05/20 0132  AST 30  ALT 36  ALKPHOS 47  BILITOT 0.2*  PROT 5.6*  ALBUMIN 2.8*     CBG: Recent Labs  Lab 11/11/20 0651 11/11/20 0802 11/11/20 1151 11/11/20 1350 11/11/20 1611  GLUCAP 223* 215* 60* 114* 260*      Recent Results (from the past 240 hour(s))  SARS CORONAVIRUS 2 (TAT 6-24 HRS) Nasopharyngeal Nasopharyngeal Swab     Status: None   Collection Time: 11/02/20  9:13 PM   Specimen: Nasopharyngeal Swab  Result Value Ref Range Status   SARS Coronavirus 2 NEGATIVE NEGATIVE Final    Comment: (NOTE) SARS-CoV-2 target nucleic acids are NOT DETECTED.  The SARS-CoV-2 RNA is generally detectable in upper and lower respiratory specimens during the acute phase of infection. Negative results do not preclude SARS-CoV-2 infection, do not rule out co-infections with other pathogens, and  should not be used as the sole basis for treatment or other patient management decisions. Negative results must be combined with clinical observations, patient history, and epidemiological information. The expected result is Negative.  Fact Sheet for Patients: 11/04/20  Fact Sheet for Healthcare Providers: HairSlick.no  This test is not yet approved or cleared by the [place] FDA and  has been authorized for detection and/or diagnosis of SARS-CoV-2 by FDA under an Emergency Use Authorization (EUA). This EUA will remain  in effect (meaning this test can be used) for the duration of the COVID-19 declaration under Se ction 564(b)(1)  of the Act, 21 U.S.C. section 360bbb-3(b)(1), unless the authorization is terminated or revoked sooner.  Performed at [facility], [address] [place], [place] [address]           Radiology Studies: No results found.      Scheduled Meds:  aspirin EC  81 mg Oral Daily   atorvastatin  40 mg Oral QPM   clopidogrel  75 mg Oral Daily   dronabinol  5 mg Oral BID AC   enoxaparin (LOVENOX) injection  20 mg Subcutaneous Q24H   feeding supplement  237 mL Oral BID BM   insulin aspart  0-5 Units Subcutaneous QHS   insulin aspart  0-9 Units Subcutaneous TID WC   insulin aspart  8 Units Subcutaneous TID WC   insulin glargine  15 Units Subcutaneous Daily   ivabradine  2.5 mg Oral BID WC   mirtazapine  15 mg Oral QHS   multivitamin with minerals  1 tablet Oral Daily   sodium zirconium cyclosilicate  10 g Oral BID   Continuous Infusions:   LOS: 8 days        Kathlen Mody, MD [facility]   To contact the attending provider between 7A-7P or the covering provider during after hours 7P-7A, please log into the web site [email or web address] and access using universal [place] password for that web site. If you do not have the password, please call the hospital  operator.  11/11/2020, 4:37 PM

## 2020-11-12 LAB — CBC WITH DIFFERENTIAL/PLATELET
Abs Immature Granulocytes: 0.03 10*3/uL (ref 0.00–0.07)
Basophils Absolute: 0.1 10*3/uL (ref 0.0–0.1)
Basophils Relative: 1 %
Eosinophils Absolute: 0.2 10*3/uL (ref 0.0–0.5)
Eosinophils Relative: 3 %
HCT: 31.7 % — ABNORMAL LOW (ref 36.0–46.0)
Hemoglobin: 10.3 g/dL — ABNORMAL LOW (ref 12.0–15.0)
Immature Granulocytes: 1 %
Lymphocytes Relative: 27 %
Lymphs Abs: 1.8 10*3/uL (ref 0.7–4.0)
MCH: 30.8 pg (ref 26.0–34.0)
MCHC: 32.5 g/dL (ref 30.0–36.0)
MCV: 94.9 fL (ref 80.0–100.0)
Monocytes Absolute: 0.6 10*3/uL (ref 0.1–1.0)
Monocytes Relative: 9 %
Neutro Abs: 4 10*3/uL (ref 1.7–7.7)
Neutrophils Relative %: 59 %
Platelets: 277 10*3/uL (ref 150–400)
RBC: 3.34 MIL/uL — ABNORMAL LOW (ref 3.87–5.11)
RDW: 12.5 % (ref 11.5–15.5)
WBC: 6.6 10*3/uL (ref 4.0–10.5)
nRBC: 0 % (ref 0.0–0.2)

## 2020-11-12 LAB — GLUCOSE, CAPILLARY
Glucose-Capillary: 115 mg/dL — ABNORMAL HIGH (ref 70–99)
Glucose-Capillary: 141 mg/dL — ABNORMAL HIGH (ref 70–99)
Glucose-Capillary: 157 mg/dL — ABNORMAL HIGH (ref 70–99)
Glucose-Capillary: 164 mg/dL — ABNORMAL HIGH (ref 70–99)
Glucose-Capillary: 213 mg/dL — ABNORMAL HIGH (ref 70–99)
Glucose-Capillary: 307 mg/dL — ABNORMAL HIGH (ref 70–99)
Glucose-Capillary: 336 mg/dL — ABNORMAL HIGH (ref 70–99)
Glucose-Capillary: 359 mg/dL — ABNORMAL HIGH (ref 70–99)

## 2020-11-12 LAB — POTASSIUM: Potassium: 5.7 mmol/L — ABNORMAL HIGH (ref 3.5–5.1)

## 2020-11-12 NOTE — Plan of Care (Signed)
  Problem: Safety: Goal: Ability to remain free from injury will improve Outcome: Progressing   

## 2020-11-12 NOTE — Progress Notes (Signed)
Physical Therapy Treatment Patient Details Name: Christine Benitez MRN: [id number] DOB: 1960-07-15 Today's Date: 11/12/2020    History of Present Illness Pt is a 60 y/o female admitted 6/26 secondary to hypoglycemia and unresponsiveness. PMH includes DM, CHF, CKD, cardiomyopathy, HTN, CVA,    PT Comments    Pt admitted with above diagnosis. Pt ambulated with RW with min guard to min assist occasionally in enclosed spaces. Pt is safe overall and will have assist at home.   Incr distance today with walk.  Will continue PT.  Pt currently with functional limitations due to the deficits listed below (see PT Problem List). Pt will benefit from skilled PT to increase their independence and safety with mobility to allow discharge to the venue listed below.      Follow Up Recommendations  Home health PT;Supervision for mobility/OOB     Equipment Recommendations  None recommended by PT    Recommendations for Other Services       Precautions / Restrictions Precautions Precautions: Fall Precaution Comments: HOH, poor vision Restrictions Weight Bearing Restrictions: No    Mobility  Bed Mobility Overal bed mobility: Modified Independent Bed Mobility: Supine to Sit;Sit to Supine     Supine to sit: Supervision Sit to supine: Supervision        Transfers Overall transfer level: Needs assistance Equipment used: Rolling walker (2 wheeled) Transfers: Sit to/from Stand Sit to Stand: Supervision         General transfer comment: supervision, good power up and self steady.  Transferred to 3N1 and urinated prior to walk.  Ambulation/Gait Ambulation/Gait assistance: Min guard;Min assist Gait Distance (Feet): 250 Feet Assistive device: Rolling walker (2 wheeled) Gait Pattern/deviations: Step-through pattern Gait velocity: reduced Gait velocity interpretation: <1.8 ft/sec, indicate of risk for recurrent falls General Gait Details: small, slow, steps, velocity and amplitude improved with  cuing, however pt unable to maintain without cuing. Pt flexes trunk as she fatigues. Pt needed a little assist at times steering RW in more enclosed spaces but overall steady with RW.   Stairs             Wheelchair Mobility    Modified Rankin (Stroke Patients Only)       Balance Overall balance assessment: Needs assistance Sitting-balance support: No upper extremity supported;Feet supported Sitting balance-Leahy Scale: Good Sitting balance - Comments: no LOB donning socks   Standing balance support: During functional activity Standing balance-Leahy Scale: Poor Standing balance comment: Reliant on BUE support for dynamic balance                            Cognition Arousal/Alertness: Awake/alert Behavior During Therapy: Flat affect Overall Cognitive Status: History of cognitive impairments - at baseline Area of Impairment: Attention;Memory;Following commands;Safety/judgement;Awareness;Problem solving                   Current Attention Level: Focused Memory: Decreased short-term memory (repeated cues) Following Commands: Follows one step commands with increased time Safety/Judgement: Decreased awareness of safety;Decreased awareness of deficits Awareness: Intellectual Problem Solving: Slow processing;Decreased initiation;Difficulty sequencing        Exercises General Exercises - Lower Extremity Ankle Circles/Pumps: AROM;Both;10 reps;Seated Long Arc Quad: AROM;Both;10 reps;Seated Hip Flexion/Marching: AROM;Both;10 reps;Seated    General Comments General comments (skin integrity, edema, etc.): VSS on RA      Pertinent Vitals/Pain Pain Assessment: No/denies pain    Home Living  Prior Function            PT Goals (current goals can now be found in the care plan section) Acute Rehab PT Goals Patient Stated Goal: to go home Progress towards PT goals: Progressing toward goals    Frequency    Min  3X/week      PT Plan Current plan remains appropriate    Co-evaluation              AM-PAC PT "6 Clicks" Mobility   Outcome Measure  Help needed turning from your back to your side while in a flat bed without using bedrails?: None Help needed moving from lying on your back to sitting on the side of a flat bed without using bedrails?: None Help needed moving to and from a bed to a chair (including a wheelchair)?: A Little Help needed standing up from a chair using your arms (e.g., wheelchair or bedside chair)?: A Little Help needed to walk in hospital room?: A Little Help needed climbing 3-5 steps with a railing? : A Lot 6 Click Score: 19    End of Session Equipment Utilized During Treatment: Gait belt Activity Tolerance: Patient tolerated treatment well Patient left: with call bell/phone within reach;in bed;with bed alarm set Nurse Communication: Mobility status PT Visit Diagnosis: Other abnormalities of gait and mobility (R26.89)     Time: 9476-5465 PT Time Calculation (min) (ACUTE ONLY): 25 min  Charges:  $Gait Training: 23-37 mins                     Christine Benitez M,PT Acute Rehab Services [phone] (pager)    Christine Benitez 11/12/2020, 10:08 AM

## 2020-11-12 NOTE — Progress Notes (Signed)
Inpatient Diabetes Program Recommendations  AACE/ADA: New Consensus Statement on Inpatient Glycemic Control (2015)  Target Ranges:  Prepandial:   less than 140 mg/dL      Peak postprandial:   less than 180 mg/dL (1-2 hours)      Critically ill patients:  140 - 180 mg/dL   Lab Results  Component Value Date   GLUCAP 213 (H) 11/12/2020   HGBA1C 7.7 (H) 10/24/2020    Review of Glycemic Control Results for DEETRA, BOOTON (MRN [id number]) as of 11/12/2020 11:46  Ref. Range 11/12/2020 00:51 11/12/2020 04:38 11/12/2020 08:09 11/12/2020 11:23  Glucose-Capillary Latest Ref Range: 70 - 99 mg/dL 287 (H) 681 (H) 157 (H) 213 (H)   History: DM, CHF, CKD   Home DM Meds: Lantus 24 units Daily      Novolog 10 units TID   Current Orders: Lantus 15 units Daily    Novolog 0-9 units TID & HS    Novolog 8 units TID with meals  Inpatient Diabetes Program Recommendations:    Noted mild hypoglycemic event of 60 mg/dL. Consider slight reduction to meal coverage: Novolog 6 units TID (assuming pt consuming >50% of meals).   Thanks, Lujean Rave, MSN, RNC-OB Diabetes Coordinator [phone] (8a-5p)

## 2020-11-12 NOTE — Progress Notes (Signed)
Nutrition Follow Up  DOCUMENTATION CODES:   Non-severe (moderate) malnutrition in context of chronic illness, Underweight  INTERVENTION:   Continue Ensure Enlive po BID, each supplement provides 350 kcal and 20 grams of protein Continue Magic cup BID with meals, each supplement provides 290 kcal and 9 grams of protein MVI daily   NUTRITION DIAGNOSIS:   Moderate Malnutrition related to chronic illness (CHF, CVA, DM) as evidenced by mild fat depletion, mild muscle depletion, moderate muscle depletion.  Ongoing  GOAL:   Patient will meet greater than or equal to 90% of their needs  Progressing   MONITOR:   PO intake, Supplement acceptance, Labs, Weight trends, Skin, I & O's  REASON FOR ASSESSMENT:   Consult Assessment of nutrition requirement/status  ASSESSMENT:   Patient with medical history significant of insulin-dependent diabetes, chronic systolic CHF, nonischemic cardiomyopathy, pulmonary hypertension, CVA, hypertension, and CKD stage IIIa. Pt admitted with severe hypoglycemia.   Patient sleeping upon RD follow up. Attempted to wake patient up but she was unable to stay up for conversation. Intake progressing per RN. Last four meal completions charted as 80%, 60%, 75%, and 80%. Taking Ensure 1-2 times daily.   Noted hypoglycemic and hyperglycemic episodes. Patient is known brittle diabetic. Given malnutrition and history of poor PO intake, recommend continuing Ensure Enlive vs Glucerna as it provides more kcal and protein.   New insulin regimen:  -Lantus 15 units Daily -Novolog 0-9 units TID & HS -Novolog 6 units TID with meals  Admission weight: 42.6 kg  Current weight: 47.7 kg   UOP: 2550 ml x 24 hrs   Medications: marinol BID, SS novolog, lantus, remeron, lokelma  Labs: K 5.7 (H) CBG 60-301  Diet Order:   Diet Order             Diet Carb Modified Fluid consistency: Thin; Room service appropriate? Yes with Assist  Diet effective now           Diet - low  sodium heart healthy                   EDUCATION NEEDS:   Education needs have been addressed  Skin:  Skin Assessment: Reviewed RN Assessment  Last BM:  7/3  Height:   Ht Readings from Last 1 Encounters:  11/02/20 5\' 4"  (1.626 m)    Weight:   Wt Readings from Last 1 Encounters:  11/04/20 47.7 kg    Ideal Body Weight:  54.5 kg  BMI:  Body mass index is 18.05 kg/m.  Estimated Nutritional Needs:   Kcal:  1500-1700  Protein:  70-85 grams  Fluid:  > 1.5 L  Christine Schmuhl MS, RD, LDN, CNSC Clinical Nutrition Pager listed in AMION

## 2020-11-12 NOTE — Progress Notes (Signed)
PROGRESS NOTE    Christine Benitez  TMH:[id number] DOB: 10-05-1960 DOA: 11/02/2020 PCP: Lupita Dawn, MD    Chief Complaint  Patient presents with   Hypoglycemia    Brief Narrative:  60 year old female with brittle diabetes, cardiomyopathy, chronic kidney disease, admitted to the hospital with hypoglycemia and acute metabolic encephalopathy.  Recent admission for urinary tract infection which she was discharged on 6/21  Subjective:   She appears drowsy and she is very weak, she does not follow commands consistently, does not answer questions consistently She states "feeling cold", " I am alright" She appear very malnurished with temproal waisting  Assessment & Plan:   Principal Problem:   Hypoglycemia Active Problems:   Type 2 diabetes mellitus with complication, with long-term current use of insulin (HCC)   Chronic systolic CHF (congestive heart failure) (HCC)   Constipation   Cachexia (HCC)   Malnutrition of moderate degree   Hyperkalemia   Sever hypoglycemia in the setting of insulin-dependent type 2 diabetes -Likely due to inadequate oral intake Continue adjust insulin  Hyperkalemia Unclear etiology Change to low potassium diet Continue Lokelma  CKD 3 A Creatinine appear to be close to baseline  History of CVA continue aspirin/Plavix/Lipitor  Failure to thrive/malnutrition/ Patient was recently seen by palliative care during last hospitalization, at the time there is a discussion of hospice Will try to reach family to rediscuss goals of care, patient appears very lethargic this morning not engaged in conversation  Nutritional Assessment:  The patient's BMI is: Body mass index is 18.05 kg/m.Marland Kitchen  Seen by dietician.  I agree with the assessment and plan as outlined below:  Nutrition Status: Nutrition Problem: Moderate Malnutrition Etiology: chronic illness (CHF, CVA, DM) Signs/Symptoms: mild fat depletion, mild muscle depletion, moderate muscle  depletion Interventions: Ensure Enlive (each supplement provides 350kcal and 20 grams of protein), Magic cup, MVI  .   Unresulted Labs (From admission, onward)     Start     Ordered   11/13/20 0500  Cortisol  Tomorrow morning,   R       Question:  Specimen collection method  Answer:  Lab=Lab collect   11/12/20 0844              DVT prophylaxis:   Lovenox   Code Status: DNR Family Communication: Having difficulty getting in touch with family Disposition:   Status is: Inpatient  Dispo: The patient is from: Home              Anticipated d/c is to: To be determined, need goals of care discussion              Anticipated d/c date is:  48 -72hours pending on oral intake, potassium level, goals of care discussion               Consultants:  None  Procedures:  None  Antimicrobials:   Anti-infectives (From admission, onward)    None           Objective: Vitals:   11/12/20 0008 11/12/20 0428 11/12/20 0807 11/12/20 1111  BP: 113/63 111/66 (!) 101/59 101/60  Pulse: 72 69 70 73  Resp: 18 17 12 18   Temp: 98.4 F (36.9 C) 98 F (36.7 C) 98 F (36.7 C) 97.9 F (36.6 C)  TempSrc: Oral Oral Oral Oral  SpO2: 100% 100% 98% 100%  Weight:      Height:        Intake/Output Summary (Last 24 hours) at 11/12/2020 1303 Last data filed  at 11/12/2020 1112 Gross per 24 hour  Intake 893.57 ml  Output 2750 ml  Net -1856.43 ml   Filed Weights   11/02/20 1619 11/02/20 2248 11/04/20 0351  Weight: 42.6 kg 42.6 kg 47.7 kg    Examination:  General exam: Appears confused, lethargic, does not follow command consistently, appear malnourished, with temporal wasting low muscle mass low fat content Respiratory system: Decreased at bases, no wheezing, no rales. Respiratory effort normal. Cardiovascular system: S1 & S2 heard, RRR. No JVD, no murmur, No pedal edema. Gastrointestinal system: Abdomen is nondistended, soft and nontender. Normal bowel sounds heard. Central nervous  system: Appear confused, lethargic  Extremities: Generalized weakness Skin: No rashes, lesions or ulcers Psychiatry: Confused and lethargic   Data Reviewed: I have personally reviewed following labs and imaging studies  CBC: Recent Labs  Lab 11/11/20 0101 11/12/20 0056  WBC 5.3 6.6  NEUTROABS 2.9 4.0  HGB 10.0* 10.3*  HCT 30.2* 31.7*  MCV 94.7 94.9  PLT 279 277    Basic Metabolic Panel: Recent Labs  Lab 11/06/20 1059 11/11/20 0648 11/11/20 1700 11/11/20 2123 11/12/20 0056  NA 136 126* 127*  --   --   K 5.1 6.0* 5.2* 5.1 5.7*  CL 104 100 97*  --   --   CO2 23 21* 24  --   --   GLUCOSE 215* 238* 281*  --   --   BUN 35* 38* 33*  --   --   CREATININE 1.35* 1.23* 1.38*  --   --   CALCIUM 9.4 9.2 8.7*  --   --     GFR: Estimated Creatinine Clearance: 33.5 mL/min (A) (by C-G formula based on SCr of 1.38 mg/dL (H)).  Liver Function Tests: No results for input(s): AST, ALT, ALKPHOS, BILITOT, PROT, ALBUMIN in the last 168 hours.  CBG: Recent Labs  Lab 11/11/20 2241 11/12/20 0051 11/12/20 0438 11/12/20 0809 11/12/20 1123  GLUCAP 184* 164* 157* 141* 213*     Recent Results (from the past 240 hour(s))  SARS CORONAVIRUS 2 (TAT 6-24 HRS) Nasopharyngeal Nasopharyngeal Swab     Status: None   Collection Time: 11/02/20  9:13 PM   Specimen: Nasopharyngeal Swab  Result Value Ref Range Status   SARS Coronavirus 2 NEGATIVE NEGATIVE Final    Comment: (NOTE) SARS-CoV-2 target nucleic acids are NOT DETECTED.  The SARS-CoV-2 RNA is generally detectable in upper and lower respiratory specimens during the acute phase of infection. Negative results do not preclude SARS-CoV-2 infection, do not rule out co-infections with other pathogens, and should not be used as the sole basis for treatment or other patient management decisions. Negative results must be combined with clinical observations, patient history, and epidemiological information. The expected result is  Negative.  Fact Sheet for Patients: HairSlick.no  Fact Sheet for Healthcare Providers: quierodirigir.com  This test is not yet approved or cleared by the [place] FDA and  has been authorized for detection and/or diagnosis of SARS-CoV-2 by FDA under an Emergency Use Authorization (EUA). This EUA will remain  in effect (meaning this test can be used) for the duration of the COVID-19 declaration under Se ction 564(b)(1) of the Act, 21 U.S.C. section 360bbb-3(b)(1), unless the authorization is terminated or revoked sooner.  Performed at [facility], [address], [place], [place] [address]          Radiology Studies: No results found.      Scheduled Meds:  aspirin EC  81 mg Oral Daily  atorvastatin  40 mg Oral QPM   clopidogrel  75 mg Oral Daily   dronabinol  5 mg Oral BID AC   enoxaparin (LOVENOX) injection  20 mg Subcutaneous Q24H   feeding supplement  237 mL Oral BID BM   insulin aspart  0-5 Units Subcutaneous QHS   insulin aspart  0-9 Units Subcutaneous TID WC   insulin aspart  8 Units Subcutaneous TID WC   insulin glargine  15 Units Subcutaneous Daily   ivabradine  2.5 mg Oral BID WC   mirtazapine  15 mg Oral QHS   multivitamin with minerals  1 tablet Oral Daily   sodium zirconium cyclosilicate  10 g Oral BID   Continuous Infusions:   LOS: 9 days   Time spent: 25 mins Greater than 50% of this time was spent in counseling, explanation of diagnosis, planning of further management, and coordination of care.   Voice Recognition Reubin Milan dictation system was used to create this note, attempts have been made to correct errors. Please contact the author with questions and/or clarifications.   Albertine Grates, MD PhD FACP [facility]  Available via Epic secure chat 7am-7pm for nonurgent issues Please page for urgent issues To page the attending provider between 7A-7P or the covering provider  during after hours 7P-7A, please log into the web site [email or web address] and access using universal [place] password for that web site. If you do not have the password, please call the hospital operator.    11/12/2020, 1:03 PM

## 2020-11-12 NOTE — Progress Notes (Signed)
Occupational Therapy Treatment Patient Details Name: Christine Benitez MRN: [id number] DOB: 08-Feb-1961 Today's Date: 11/12/2020    History of present illness Pt is a 60 y/o female admitted 6/26 secondary to hypoglycemia and unresponsiveness. PMH includes DM, CHF, CKD, cardiomyopathy, HTN, CVA,   OT comments  Pt needing encouragement for OOB ADL stating she was cold. Donned her socks with set up, ambulated with RW to [facility] for toileting and then sink for grooming with min guard assist. Pt declined remaining up in chair, reporting it was not comfortable. Provided warm blankets for pt and adjusted room temperature at end of session. Continue to recommend HHOT.   Follow Up Recommendations  Home health OT , 24 hour supervision   Equipment Recommendations  None recommended by OT    Recommendations for Other Services      Precautions / Restrictions Precautions Precautions: Fall Precaution Comments: HOH, poor vision Restrictions Weight Bearing Restrictions: No       Mobility Bed Mobility Overal bed mobility: Modified Independent                  Transfers Overall transfer level: Needs assistance Equipment used: Rolling walker (2 wheeled) Transfers: Sit to/from Stand Sit to Stand: Supervision              Balance Overall balance assessment: Needs assistance   Sitting balance-Leahy Scale: Good Sitting balance - Comments: no LOB donning socks     Standing balance-Leahy Scale: Poor Standing balance comment: Reliant on BUE support for dynamic balance                           ADL either performed or assessed with clinical judgement   ADL Overall ADL's : Needs assistance/impaired     Grooming: Wash/dry hands;Standing;Min guard           Upper Body Dressing : Minimal assistance;Sitting Upper Body Dressing Details (indicate cue type and reason): front opening gown Lower Body Dressing: Set up;Sitting/lateral leans Lower Body Dressing Details (indicate  cue type and reason): donned her socks Toilet Transfer: Min guard;Ambulation;RW;BSC   Toileting- Architect and Hygiene: Min guard;Sit to/from stand       Functional mobility during ADLs: Health and safety inspector     Praxis      Cognition Arousal/Alertness: Awake/alert Behavior During Therapy: Flat affect Overall Cognitive Status: History of cognitive impairments - at baseline                                          Exercises     Shoulder Instructions       General Comments      Pertinent Vitals/ Pain       Pain Assessment: No/denies pain  Home Living                                          Prior Functioning/Environment              Frequency  Min 2X/week        Progress Toward Goals  OT Goals(current goals can now be found in the care plan section)  Progress towards OT goals: Progressing toward goals  Acute Rehab OT  Goals Patient Stated Goal: to go home OT Goal Formulation: With patient Time For Goal Achievement: 11/19/20 Potential to Achieve Goals: Fair  Plan Discharge plan remains appropriate;Frequency remains appropriate    Co-evaluation                 AM-PAC OT "6 Clicks" Daily Activity     Outcome Measure   Help from another person eating meals?: None Help from another person taking care of personal grooming?: A Little Help from another person toileting, which includes using toliet, bedpan, or urinal?: A Little Help from another person bathing (including washing, rinsing, drying)?: A Little Help from another person to put on and taking off regular upper body clothing?: A Little Help from another person to put on and taking off regular lower body clothing?: A Little 6 Click Score: 19    End of Session Equipment Utilized During Treatment: Gait belt;Rolling walker  OT Visit Diagnosis: Muscle weakness (generalized) (M62.81)   Activity Tolerance  Patient tolerated treatment well   Patient Left in bed;with call bell/phone within reach;with bed alarm set   Nurse Communication          Time: 7001-7494 OT Time Calculation (min): 14 min  Charges: OT General Charges $OT Visit: 1 Visit OT Treatments $Self Care/Home Management : 8-22 mins  Martie Round, OTR/L Acute Rehabilitation Services Pager: [phone] Office: [phone]    Evern Bio 11/12/2020, 9:39 AM

## 2020-11-12 NOTE — Progress Notes (Signed)
Mobility Specialist: Progress Note   11/12/20 1647  Mobility  Activity Ambulated in hall  Level of Assistance Minimal assist, patient does 75% or more  Assistive Device Front wheel walker  Distance Ambulated (ft) 160 ft  Mobility Ambulated with assistance in hallway  Mobility Response Tolerated well  Mobility performed by Mobility specialist  $Mobility charge 1 Mobility   Pre-Mobility: 80 HR, 99/56 BP, 97% SpO2 During Mobility: 109 HR Post-Mobility: 89 HR, 110/67 BP  Pt ax throughout ambulation. Pt back to bed after walk per request with bed alarm on.   [facility] [place] Human Mobility Specialist Mobility Specialist Phone: [phone]

## 2020-11-13 ENCOUNTER — Encounter (HOSPITAL_COMMUNITY): Payer: Self-pay

## 2020-11-13 LAB — BASIC METABOLIC PANEL
Anion gap: 6 (ref 5–15)
BUN: 35 mg/dL — ABNORMAL HIGH (ref 6–20)
CO2: 23 mmol/L (ref 22–32)
Calcium: 8.9 mg/dL (ref 8.9–10.3)
Chloride: 101 mmol/L (ref 98–111)
Creatinine, Ser: 1.3 mg/dL — ABNORMAL HIGH (ref 0.44–1.00)
GFR, Estimated: 48 mL/min — ABNORMAL LOW (ref >60)
Glucose, Bld: 259 mg/dL — ABNORMAL HIGH (ref 70–99)
Potassium: 4.6 mmol/L (ref 3.5–5.1)
Sodium: 130 mmol/L — ABNORMAL LOW (ref 135–145)

## 2020-11-13 LAB — GLUCOSE, CAPILLARY
Glucose-Capillary: 221 mg/dL — ABNORMAL HIGH (ref 70–99)
Glucose-Capillary: 210 mg/dL — ABNORMAL HIGH (ref 70–99)
Glucose-Capillary: 52 mg/dL — ABNORMAL LOW (ref 70–99)
Glucose-Capillary: 55 mg/dL — ABNORMAL LOW (ref 70–99)
Glucose-Capillary: 294 mg/dL — ABNORMAL HIGH (ref 70–99)
Glucose-Capillary: 238 mg/dL — ABNORMAL HIGH (ref 70–99)

## 2020-11-13 LAB — CORTISOL: Cortisol, Plasma: 3.2 ug/dL

## 2020-11-13 MED ORDER — INSULIN GLARGINE 100 UNIT/ML ~~LOC~~ SOLN
6.0000 [IU] | Freq: Two times a day (BID) | SUBCUTANEOUS | Status: DC
  Administered 2020-11-13 – 2020-11-15 (×5): 6 [IU] via SUBCUTANEOUS
  Filled 2020-11-13 (×7): qty 0.06

## 2020-11-13 MED ORDER — COSYNTROPIN 0.25 MG IJ SOLR
0.2500 mg | Freq: Once | INTRAMUSCULAR | Status: AC
  Administered 2020-11-14: 0.25 mg via INTRAVENOUS
  Filled 2020-11-13: qty 0.25

## 2020-11-13 MED ORDER — INSULIN ASPART 100 UNIT/ML IJ SOLN
0.0000 [IU] | INTRAMUSCULAR | Status: DC
  Administered 2020-11-13: 5 [IU] via SUBCUTANEOUS
  Administered 2020-11-13 – 2020-11-14 (×4): 3 [IU] via SUBCUTANEOUS
  Administered 2020-11-14: 7 [IU] via SUBCUTANEOUS
  Administered 2020-11-15: 5 [IU] via SUBCUTANEOUS
  Administered 2020-11-15: 1 [IU] via SUBCUTANEOUS
  Administered 2020-11-15: 5 [IU] via SUBCUTANEOUS
  Administered 2020-11-15: 9 [IU] via SUBCUTANEOUS
  Administered 2020-11-16: 3 [IU] via SUBCUTANEOUS
  Administered 2020-11-16 (×3): 5 [IU] via SUBCUTANEOUS
  Administered 2020-11-16: 2 [IU] via SUBCUTANEOUS
  Administered 2020-11-16: 5 [IU] via SUBCUTANEOUS
  Administered 2020-11-17: 3 [IU] via SUBCUTANEOUS
  Administered 2020-11-17: 5 [IU] via SUBCUTANEOUS

## 2020-11-13 MED ORDER — DEXTROSE 50 % IV SOLN
INTRAVENOUS | Status: AC
  Filled 2020-11-13: qty 50

## 2020-11-13 MED ORDER — SODIUM CHLORIDE 0.9 % IV SOLN: INTRAVENOUS | Status: AC

## 2020-11-13 NOTE — Plan of Care (Signed)
  Problem: Activity: Goal: Risk for activity intolerance will decrease Outcome: Progressing   

## 2020-11-13 NOTE — Progress Notes (Signed)
Inpatient Diabetes Program Recommendations  AACE/ADA: New Consensus Statement on Inpatient Glycemic Control (2015)  Target Ranges:  Prepandial:   less than 140 mg/dL      Peak postprandial:   less than 180 mg/dL (1-2 hours)      Critically ill patients:  140 - 180 mg/dL   Lab Results  Component Value Date   GLUCAP 55 (L) 11/13/2020   HGBA1C 7.7 (H) 10/24/2020    Review of Glycemic Control Results for Christine Benitez, Christine Benitez (MRN [id number]) as of 11/13/2020 12:23  Ref. Range 11/12/2020 23:50 11/13/2020 04:38 11/13/2020 07:57 11/13/2020 11:44 11/13/2020 12:09  Glucose-Capillary Latest Ref Range: 70 - 99 mg/dL 680 (H) 321 (H) 224 (H) 52 (L) 55 (L)   History: DM, CHF, CKD   Home DM Meds: Lantus 24 units Daily      Novolog 10 units TID   Current Orders: Lantus 6 units BID    Novolog 0-9 units TID & HS    Novolog 8 units TID with meals   Inpatient Diabetes Program Recommendations:     Noted mild hypoglycemic event of 55 mg/dL. This was following Novolog 11 units to include meal coverage. Verified with RN, patient did not consume breakfast. Intake has continued to remain poor.   Consider: -Discontinuation of meal coverage  -Change correction to Novolog 0-9 units Q4H  Thanks, Lujean Rave, MSN, RNC-OB Diabetes Coordinator [phone] (8a-5p)

## 2020-11-13 NOTE — Progress Notes (Signed)
Patient CBG 359 @ midnight. Given 4 units @ 2300. Paged Dr. Toniann Fail. Awaiting call back

## 2020-11-13 NOTE — Progress Notes (Addendum)
PROGRESS NOTE    Christine Benitez  KXF:[id number] DOB: 1961/01/23 DOA: 11/02/2020 PCP: Lupita Dawn, MD    Chief Complaint  Patient presents with   Hypoglycemia    Brief Narrative:  60 year old female with brittle diabetes, cardiomyopathy, chronic kidney disease, admitted to the hospital with hypoglycemia and acute metabolic encephalopathy.  Recent admission for urinary tract infection which she was discharged on 6/21  Subjective:  She did not eat breakfast, her blood glucose drop into the 50, getting d50 x1 now She appears weak but alert and interactive, she is very hard of hearing  She appear very malnurished with temproal waisting   Assessment & Plan:   Principal Problem:   Hypoglycemia Active Problems:   Type 2 diabetes mellitus with complication, with long-term current use of insulin (HCC)   Chronic systolic CHF (congestive heart failure) (HCC)   Constipation   Cachexia (HCC)   Malnutrition of moderate degree   Hyperkalemia   Sever hypoglycemia in the setting of insulin-dependent type 2 diabetes -Likely due to inadequate oral intake -continue to have highs and lows, decrease lantus and change to bid dosing, continue ssi, change from tid to q4hrs Continue adjust insulin Continue to have poor oral intake, borderline low bp, will start gentle hydration  Random cortisol level 3.2, will do cosyntropin test  Hyperkalemia Unclear etiology Change to low potassium diet Continue Lokelma Per chart review free appear to be on Lokelma daily chronically  CKD 3 A Creatinine appear to be close to baseline  History of CVA with left-sided weakness , cognitive impairment  continue aspirin/Plavix/Lipitor  Nonischemic cardiomyopathy Was seen by heart failure clinic on October 16, 2020 On ivabradine 2.5 mg twice daily due to persistent tachycardia She is deemed not a good candidate for SGLT2 inhibitor due to frequent UTIs, she is not a good candidate for beta-blocker and hydralazine  due to low blood pressure she does not need diuretics regularly per cardiology notes,   Failure to thrive/malnutrition Patient was recently seen by palliative care during last hospitalization, at the time there is a discussion of hospice Will do calorie count, she does not appear to eat much at all I discussed with mother regarding goals of care, hospital course/labs/imaging briefly reviewed, per mother, per mother patient's memory got worse after cva in 03/2020, probably forgot to pay her insurance bill. mother is helping her  to get medicaid. I do not think patient will do well  on herself after discharge, she will need home health, assisted living, ect.. However if she does not have adequate oral intake , she may qualify for hospice, per mother patient has significant weight loss since after CVA in 03/2020 Will reconsult palliative care as well, mother agrees to palliative care consult   Nutritional Assessment:  The patient's BMI is: Body mass index is 18.05 kg/m.Marland Kitchen  Seen by dietician.  I agree with the assessment and plan as outlined below:  Nutrition Status: Nutrition Problem: Moderate Malnutrition Etiology: chronic illness (CHF, CVA, DM) Signs/Symptoms: mild fat depletion, mild muscle depletion, moderate muscle depletion Interventions: Ensure Enlive (each supplement provides 350kcal and 20 grams of protein), Magic cup, MVI  .   Unresulted Labs (From admission, onward)    None         DVT prophylaxis:   Lovenox   Code Status: DNR Family Communication: mother over the phone Disposition:   Status is: Inpatient  Dispo: The patient is from: Home  Anticipated d/c is to: To be determined, need goals of care discussion              Anticipated d/c date is:  pending on oral intake, blood glucose still widely fluctuation due to inconsistent oral intake, needs goals of care discussion               Consultants:  Palliative care  Procedures:   None  Antimicrobials:   Anti-infectives (From admission, onward)    None           Objective: Vitals:   11/12/20 2346 11/13/20 0435 11/13/20 0700 11/13/20 1100  BP: 111/64 (!) 103/58 (!) 105/57 (!) 84/51  Pulse: 75 75 72 73  Resp: 15 18 18 16   Temp: 98.2 F (36.8 C) 98.9 F (37.2 C) 98.1 F (36.7 C) 98 F (36.7 C)  TempSrc: Oral Oral Oral Oral  SpO2: 98% 98% 100% 99%  Weight:      Height:        Intake/Output Summary (Last 24 hours) at 11/13/2020 1218 Last data filed at 11/12/2020 2347 Gross per 24 hour  Intake --  Output 1400 ml  Net -1400 ml   Filed Weights   11/02/20 1619 11/02/20 2248 11/04/20 0351  Weight: 42.6 kg 42.6 kg 47.7 kg    Examination:  General exam: appear more alert, interactive, aaox3, very hard of hearing ,  temporal wasting ,low muscle mass ,low fat content Respiratory system: Decreased at bases, no wheezing, no rales. Respiratory effort normal. Cardiovascular system: S1 & S2 heard, RRR. No JVD, no murmur, No pedal edema. Gastrointestinal system: Abdomen is nondistended, soft and nontender. Normal bowel sounds heard. Central nervous system: aaox3, slow to answer questions, does appear to have cognitive impairment Extremities: Generalized weakness Skin: No rashes, lesions or ulcers Psychiatry: flat affect, calm , no agitation    Data Reviewed: I have personally reviewed following labs and imaging studies  CBC: Recent Labs  Lab 11/11/20 0101 11/12/20 0056  WBC 5.3 6.6  NEUTROABS 2.9 4.0  HGB 10.0* 10.3*  HCT 30.2* 31.7*  MCV 94.7 94.9  PLT 279 277    Basic Metabolic Panel: Recent Labs  Lab 11/11/20 0648 11/11/20 1700 11/11/20 2123 11/12/20 0056 11/13/20 0223  NA 126* 127*  --   --  130*  K 6.0* 5.2* 5.1 5.7* 4.6  CL 100 97*  --   --  101  CO2 21* 24  --   --  23  GLUCOSE 238* 281*  --   --  259*  BUN 38* 33*  --   --  35*  CREATININE 1.23* 1.38*  --   --  1.30*  CALCIUM 9.2 8.7*  --   --  8.9    GFR: Estimated  Creatinine Clearance: 35.5 mL/min (A) (by C-G formula based on SCr of 1.3 mg/dL (H)).  Liver Function Tests: No results for input(s): AST, ALT, ALKPHOS, BILITOT, PROT, ALBUMIN in the last 168 hours.  CBG: Recent Labs  Lab 11/12/20 2350 11/13/20 0438 11/13/20 0757 11/13/20 1144 11/13/20 1209  GLUCAP 359* 221* 210* 52* 55*     No results found for this or any previous visit (from the past 240 hour(s)).        Radiology Studies: No results found.      Scheduled Meds:  aspirin EC  81 mg Oral Daily   atorvastatin  40 mg Oral QPM   clopidogrel  75 mg Oral Daily   dronabinol  5 mg Oral BID AC  enoxaparin (LOVENOX) injection  20 mg Subcutaneous Q24H   feeding supplement  237 mL Oral BID BM   insulin aspart  0-5 Units Subcutaneous QHS   insulin aspart  0-9 Units Subcutaneous TID WC   insulin aspart  8 Units Subcutaneous TID WC   insulin glargine  6 Units Subcutaneous BID   ivabradine  2.5 mg Oral BID WC   mirtazapine  15 mg Oral QHS   multivitamin with minerals  1 tablet Oral Daily   sodium zirconium cyclosilicate  10 g Oral BID   Continuous Infusions:   LOS: 10 days   Time spent: 35 mins Greater than 50% of this time was spent in counseling, explanation of diagnosis, planning of further management, and coordination of care.   Voice Recognition Reubin Milan dictation system was used to create this note, attempts have been made to correct errors. Please contact the author with questions and/or clarifications.   Albertine Grates, MD PhD FACP [facility]  Available via Epic secure chat 7am-7pm for nonurgent issues Please page for urgent issues To page the attending provider between 7A-7P or the covering provider during after hours 7P-7A, please log into the web site [email or web address] and access using universal [place] password for that web site. If you do not have the password, please call the hospital operator.    11/13/2020, 12:18 PM

## 2020-11-14 LAB — BASIC METABOLIC PANEL
Anion gap: 4 — ABNORMAL LOW (ref 5–15)
BUN: 30 mg/dL — ABNORMAL HIGH (ref 6–20)
CO2: 25 mmol/L (ref 22–32)
Calcium: 8.8 mg/dL — ABNORMAL LOW (ref 8.9–10.3)
Chloride: 105 mmol/L (ref 98–111)
Creatinine, Ser: 1.21 mg/dL — ABNORMAL HIGH (ref 0.44–1.00)
GFR, Estimated: 52 mL/min — ABNORMAL LOW (ref >60)
Glucose, Bld: 144 mg/dL — ABNORMAL HIGH (ref 70–99)
Potassium: 4.4 mmol/L (ref 3.5–5.1)
Sodium: 134 mmol/L — ABNORMAL LOW (ref 135–145)

## 2020-11-14 LAB — GLUCOSE, CAPILLARY
Glucose-Capillary: 100 mg/dL — ABNORMAL HIGH (ref 70–99)
Glucose-Capillary: 210 mg/dL — ABNORMAL HIGH (ref 70–99)
Glucose-Capillary: 241 mg/dL — ABNORMAL HIGH (ref 70–99)
Glucose-Capillary: 248 mg/dL — ABNORMAL HIGH (ref 70–99)
Glucose-Capillary: 341 mg/dL — ABNORMAL HIGH (ref 70–99)
Glucose-Capillary: 88 mg/dL (ref 70–99)
Glucose-Capillary: 98 mg/dL (ref 70–99)

## 2020-11-14 LAB — ACTH STIMULATION, 3 TIME POINTS
Cortisol, 30 Min: 19.3 ug/dL
Cortisol, 60 Min: 24.1 ug/dL
Cortisol, Base: 2.7 ug/dL

## 2020-11-14 NOTE — Progress Notes (Signed)
PROGRESS NOTE    KURSTIN DIMARZO  BHA:[id number] DOB: Oct 12, 1960 DOA: 11/02/2020 PCP: Lupita Dawn, MD    Chief Complaint  Patient presents with   Hypoglycemia    Brief Narrative:  60 year old female with brittle diabetes, cardiomyopathy, chronic kidney disease, admitted to the hospital with hypoglycemia and acute metabolic encephalopathy.  Recent admission for urinary tract infection which she was discharged on 6/21  Subjective:  I have yet to see her out bed , she appears has been in bed except when PT comes to see here,  out of bed order placed She appears weak but alert and interactive, she is very hard of hearing  She appear very malnurished with temproal waisting  Calorie count is underway, blood glucose still fluctuation but appeared has improved some  Assessment & Plan:   Principal Problem:   Hypoglycemia Active Problems:   Type 2 diabetes mellitus with complication, with long-term current use of insulin (HCC)   Chronic systolic CHF (congestive heart failure) (HCC)   Constipation   Cachexia (HCC)   Malnutrition of moderate degree   Hyperkalemia   Sever hypoglycemia in the setting of insulin-dependent type 2 diabetes -Likely due to inadequate oral intake -continue to have highs and lows, decrease lantus and change to bid dosing, continue ssi, change from tid to q4hrs Continue adjust insulin Continue to have poor oral intake, borderline low bp, will start gentle hydration  Random cortisol level 2.7 to 3.2  Test underway to rule out adrenal insufficiency  Hyperkalemia Unclear etiology Change to low potassium diet Continue Lokelma Per chart review free appear to be on Lokelma daily chronically  CKD 3 A Creatinine appear to be close to baseline  History of CVA with left-sided weakness , cognitive impairment  continue aspirin/Plavix/Lipitor  Nonischemic cardiomyopathy Was seen by heart failure clinic on October 16, 2020 On ivabradine 2.5 mg twice daily due to  persistent tachycardia She is deemed not a good candidate for SGLT2 inhibitor due to frequent UTIs, she is not a good candidate for beta-blocker and hydralazine due to low blood pressure she does not need diuretics regularly per cardiology notes,   Failure to thrive/malnutrition Patient was recently seen by palliative care during last hospitalization, at the time there is a discussion of hospice Will do calorie count, she does not appear to eat much at all I discussed with mother regarding goals of care, hospital course/labs/imaging briefly reviewed, per mother, per mother patient's memory got worse after cva in 03/2020, probably forgot to pay her insurance bill. mother is helping her  to get medicaid. I do not think patient will do well  on herself after discharge, she will need home health, assisted living, ect.. However if she does not have adequate oral intake , she may qualify for hospice, per mother patient has significant weight loss since after CVA in 03/2020 Will reconsult palliative care as well, mother agrees to palliative care consult   Nutritional Assessment: The patient's BMI is: Body mass index is 18.05 kg/m.Marland Kitchen Seen by dietician.  I agree with the assessment and plan as outlined below: Nutrition Status: Nutrition Problem: Moderate Malnutrition Etiology: chronic illness (CHF, CVA, DM) Signs/Symptoms: mild fat depletion, mild muscle depletion, moderate muscle depletion Interventions: Ensure Enlive (each supplement provides 350kcal and 20 grams of protein), Magic cup, MVI  .   Unresulted Labs (From admission, onward)     Start     Ordered   11/15/20 0500  ACTH Level  Tomorrow morning,   R  Question:  Specimen collection method  Answer:  Lab=Lab collect   11/14/20 1939   11/14/20 0500  Basic metabolic panel  Daily,   R     Question:  Specimen collection method  Answer:  Lab=Lab collect   11/13/20 1223              DVT prophylaxis:   Lovenox   Code Status:  DNR Family Communication: mother over the phone on 7/7 Disposition:   Status is: Inpatient  Dispo: The patient is from: Home              Anticipated d/c is to: To be determined, need goals of care discussion              Anticipated d/c date is:  pending on oral intake, blood glucose still widely fluctuation due to inconsistent oral intake, needs goals of care discussion               Consultants:  Palliative care  Procedures:  None  Antimicrobials:   Anti-infectives (From admission, onward)    None           Objective: Vitals:   11/14/20 0341 11/14/20 0737 11/14/20 1156 11/14/20 1627  BP: (!) 99/52 115/64  (!) 112/56  Pulse: 75 73 93 70  Resp: 16 18  18   Temp: 97.8 F (36.6 C) 98.2 F (36.8 C)  98.4 F (36.9 C)  TempSrc: Oral Oral  Oral  SpO2: 99% 100%  100%  Weight:      Height:        Intake/Output Summary (Last 24 hours) at 11/14/2020 1940 Last data filed at 11/14/2020 1258 Gross per 24 hour  Intake 1442.67 ml  Output 1100 ml  Net 342.67 ml   Filed Weights   11/02/20 1619 11/02/20 2248 11/04/20 0351  Weight: 42.6 kg 42.6 kg 47.7 kg    Examination:  General exam: appear more alert, interactive, aaox3, very hard of hearing ,  temporal wasting ,low muscle mass ,low fat content Respiratory system: Decreased at bases, no wheezing, no rales. Respiratory effort normal. Cardiovascular system: S1 & S2 heard, RRR. No JVD, no murmur, No pedal edema. Gastrointestinal system: Abdomen is nondistended, soft and nontender. Normal bowel sounds heard. Central nervous system: aaox3, slow to answer questions, does appear to have cognitive impairment Extremities: Generalized weakness Skin: No rashes, lesions or ulcers Psychiatry: flat affect, calm , no agitation    Data Reviewed: I have personally reviewed following labs and imaging studies  CBC: Recent Labs  Lab 11/11/20 0101 11/12/20 0056  WBC 5.3 6.6  NEUTROABS 2.9 4.0  HGB 10.0* 10.3*  HCT 30.2* 31.7*   MCV 94.7 94.9  PLT 279 277    Basic Metabolic Panel: Recent Labs  Lab 11/11/20 0648 11/11/20 1700 11/11/20 2123 11/12/20 0056 11/13/20 0223 11/14/20 0639  NA 126* 127*  --   --  130* 134*  K 6.0* 5.2* 5.1 5.7* 4.6 4.4  CL 100 97*  --   --  101 105  CO2 21* 24  --   --  23 25  GLUCOSE 238* 281*  --   --  259* 144*  BUN 38* 33*  --   --  35* 30*  CREATININE 1.23* 1.38*  --   --  1.30* 1.21*  CALCIUM 9.2 8.7*  --   --  8.9 8.8*    GFR: Estimated Creatinine Clearance: 38.2 mL/min (A) (by C-G formula based on SCr of 1.21 mg/dL (H)).  Liver Function  Tests: No results for input(s): AST, ALT, ALKPHOS, BILITOT, PROT, ALBUMIN in the last 168 hours.  CBG: Recent Labs  Lab 11/14/20 0000 11/14/20 0443 11/14/20 0738 11/14/20 1104 11/14/20 1629  GLUCAP 241* 210* 88 341* 248*     No results found for this or any previous visit (from the past 240 hour(s)).        Radiology Studies: No results found.      Scheduled Meds:  aspirin EC  81 mg Oral Daily   atorvastatin  40 mg Oral QPM   clopidogrel  75 mg Oral Daily   dronabinol  5 mg Oral BID AC   enoxaparin (LOVENOX) injection  20 mg Subcutaneous Q24H   feeding supplement  237 mL Oral BID BM   insulin aspart  0-9 Units Subcutaneous Q4H   insulin glargine  6 Units Subcutaneous BID   ivabradine  2.5 mg Oral BID WC   mirtazapine  15 mg Oral QHS   multivitamin with minerals  1 tablet Oral Daily   sodium zirconium cyclosilicate  10 g Oral BID   Continuous Infusions:   LOS: 11 days   Time spent: 35 mins Greater than 50% of this time was spent in counseling, explanation of diagnosis, planning of further management, and coordination of care.   Voice Recognition Reubin Milan dictation system was used to create this note, attempts have been made to correct errors. Please contact the author with questions and/or clarifications.   Albertine Grates, MD PhD FACP [facility]  Available via Epic secure chat 7am-7pm for  nonurgent issues Please page for urgent issues To page the attending provider between 7A-7P or the covering provider during after hours 7P-7A, please log into the web site [email or web address] and access using universal [place] password for that web site. If you do not have the password, please call the hospital operator.    11/14/2020, 7:40 PM

## 2020-11-14 NOTE — Plan of Care (Signed)
  Problem: Pain Managment: Goal: General experience of comfort will improve Outcome: Progressing   Problem: Safety: Goal: Ability to remain free from injury will improve Outcome: Progressing   

## 2020-11-14 NOTE — Progress Notes (Signed)
Physical Therapy Treatment Patient Details Name: Christine Benitez MRN: [id number] DOB: [birth year] Today's Date: 11/14/2020    History of Present Illness Pt is a 60 y/o female admitted 6/26 secondary to hypoglycemia and unresponsiveness with encephalopathy. PMH includes DM, CHF, CKD, cardiomyopathy, HTN, CVA,    PT Comments    Pt pleasant with flat affect and decreased eye contact, visual recognition throughout session. Pt able to maintain hall ambulation with use of RW with supervision and perform seated HEP. D/C plan appropriate and encouraged mobility with nursing staff.   HR 88    Follow Up Recommendations  Home health PT;Supervision for mobility/OOB     Equipment Recommendations  None recommended by PT    Recommendations for Other Services       Precautions / Restrictions Precautions Precautions: Fall Precaution Comments: HOH, decreased vision Restrictions Weight Bearing Restrictions: No    Mobility  Bed Mobility               General bed mobility comments: pt in chair on arrival and end of session    Transfers Overall transfer level: Needs assistance     Sit to Stand: Supervision         General transfer comment: supervision for safety  Ambulation/Gait Ambulation/Gait assistance: Min guard Gait Distance (Feet): 200 Feet Assistive device: Rolling walker (2 wheeled) Gait Pattern/deviations: Step-through pattern;Decreased stride length;Trunk flexed   Gait velocity interpretation: 1.31 - 2.62 ft/sec, indicative of limited community ambulator General Gait Details: cues for posture with slow gait and pt running into obstacles to left x 2 with cues to correct   Stairs             Wheelchair Mobility    Modified Rankin (Stroke Patients Only)       Balance Overall balance assessment: Needs assistance   Sitting balance-Leahy Scale: Fair     Standing balance support: During functional activity;Bilateral upper extremity supported Standing  balance-Leahy Scale: Poor Standing balance comment: Reliant on BUE support for dynamic balance                            Cognition Arousal/Alertness: Awake/alert Behavior During Therapy: Flat affect Overall Cognitive Status: No family/caregiver present to determine baseline cognitive functioning Area of Impairment: Attention;Memory;Following commands;Safety/judgement;Awareness;Problem solving                   Current Attention Level: Sustained   Following Commands: Follows one step commands with increased time Safety/Judgement: Decreased awareness of deficits   Problem Solving: Slow processing        Exercises General Exercises - Lower Extremity Long Arc Quad: AROM;Both;Seated;15 reps Hip Flexion/Marching: AROM;Both;Seated;15 reps Toe Raises: AROM;Both;15 reps;Seated Heel Raises: AROM;15 reps;Seated;Both    General Comments        Pertinent Vitals/Pain Pain Assessment: No/denies pain    Home Living                      Prior Function            PT Goals (current goals can now be found in the care plan section) Progress towards PT goals: Progressing toward goals    Frequency    Min 3X/week      PT Plan Current plan remains appropriate    Co-evaluation              AM-PAC PT "6 Clicks" Mobility   Outcome Measure  Help needed turning from your back  to your side while in a flat bed without using bedrails?: None Help needed moving from lying on your back to sitting on the side of a flat bed without using bedrails?: None Help needed moving to and from a bed to a chair (including a wheelchair)?: A Little Help needed standing up from a chair using your arms (e.g., wheelchair or bedside chair)?: A Little Help needed to walk in hospital room?: A Little Help needed climbing 3-5 steps with a railing? : A Lot 6 Click Score: 19    End of Session Equipment Utilized During Treatment: Gait belt Activity Tolerance: Patient tolerated  treatment well Patient left: in chair;with call bell/phone within reach;with chair alarm set Nurse Communication: Mobility status PT Visit Diagnosis: Other abnormalities of gait and mobility (R26.89);Muscle weakness (generalized) (M62.81)     Time: 3545-6256 PT Time Calculation (min) (ACUTE ONLY): 24 min  Charges:  $Gait Training: 8-22 mins $Therapeutic Exercise: 8-22 mins                     Delorse Shane P, PT Acute Rehabilitation Services Pager: [phone] Office: [phone]    Kimball Appleby B Avelino Herren 11/14/2020, 12:00 PM

## 2020-11-14 NOTE — Progress Notes (Signed)
Inpatient Diabetes Program Recommendations  AACE/ADA: New Consensus Statement on Inpatient Glycemic Control (2015)  Target Ranges:  Prepandial:   less than 140 mg/dL      Peak postprandial:   less than 180 mg/dL (1-2 hours)      Critically ill patients:  140 - 180 mg/dL   Lab Results  Component Value Date   GLUCAP 341 (H) 11/14/2020   HGBA1C 7.7 (H) 10/24/2020    Review of Glycemic Control Results for Christine Benitez, Christine Benitez (MRN [id number]) as of 11/14/2020 13:10  Ref. Range 11/14/2020 00:00 11/14/2020 04:43 11/14/2020 07:38 11/14/2020 11:04  Glucose-Capillary Latest Ref Range: 70 - 99 mg/dL 371 (H) 696 (H) 88 789 (H)    History: DM, CHF, CKD   Home DM Meds: Lantus 24 units Daily      Novolog 10 units TID   Current Orders: Lantus 6 units BID    Novolog 0-9 units TID & HS    Novolog 8 units TID with meals   Inpatient Diabetes Program Recommendations:    Lunchtime CBG 341 mg/dL. Noted patient has missed AM dose of Levemir. In [facility] documented, "did not meet parameters".  Secure chat sent to RN to verify reason for holding medication and encouraged to give as ordered.   Also of note, AM CBG was one hour early from Q4H orders. Insulin administration and CBGS should be 4 hours apart from previous reading.  Following and would continue with current orders.   Thanks, Lujean Rave, MSN, RNC-OB Diabetes Coordinator [phone] (8a-5p)

## 2020-11-14 NOTE — Progress Notes (Signed)
Brief Nutrition Note  Calorie Count ordered.  Document percent consumed for each item on the patient's meal tray ticket and keep in envelope. Also document percent of any supplement or snack pt consumes and keep documentation in envelope for RD to review.    RD to follow up on Calorie Count on Monday, 7/11.  Vertell Limber, RD, LDN (she/her/hers) Registered Dietitian I After-Hours/Weekend Pager # in [place]

## 2020-11-15 LAB — GLUCOSE, CAPILLARY
Glucose-Capillary: 254 mg/dL — ABNORMAL HIGH (ref 70–99)
Glucose-Capillary: 134 mg/dL — ABNORMAL HIGH (ref 70–99)
Glucose-Capillary: 359 mg/dL — ABNORMAL HIGH (ref 70–99)
Glucose-Capillary: 111 mg/dL — ABNORMAL HIGH (ref 70–99)
Glucose-Capillary: 285 mg/dL — ABNORMAL HIGH (ref 70–99)
Glucose-Capillary: 295 mg/dL — ABNORMAL HIGH (ref 70–99)

## 2020-11-15 LAB — BASIC METABOLIC PANEL
Anion gap: 7 (ref 5–15)
BUN: 28 mg/dL — ABNORMAL HIGH (ref 6–20)
CO2: 27 mmol/L (ref 22–32)
Calcium: 9.4 mg/dL (ref 8.9–10.3)
Chloride: 104 mmol/L (ref 98–111)
Creatinine, Ser: 1.13 mg/dL — ABNORMAL HIGH (ref 0.44–1.00)
GFR, Estimated: 56 mL/min — ABNORMAL LOW (ref >60)
Glucose, Bld: 113 mg/dL — ABNORMAL HIGH (ref 70–99)
Potassium: 3.9 mmol/L (ref 3.5–5.1)
Sodium: 138 mmol/L (ref 135–145)

## 2020-11-15 MED ORDER — SODIUM CHLORIDE 0.9 % IV SOLN: INTRAVENOUS | Status: DC

## 2020-11-15 NOTE — Progress Notes (Signed)
PROGRESS NOTE    Christine Benitez  PXT:[id number] DOB: [birth year] DOA: 11/02/2020 PCP: Lupita Dawn, MD    Chief Complaint  Patient presents with   Hypoglycemia    Brief Narrative:  60 year old female with brittle diabetes, cardiomyopathy, chronic kidney disease, admitted to the hospital with hypoglycemia and acute metabolic encephalopathy.  Recent admission for urinary tract infection which she was discharged on 6/21  Subjective:  I have yet to see her out bed , she appears has been in bed except when PT/OT comes to see here,  out of bed order placed She appears weak but alert and interactive, she is very hard of hearing  She appear very malnurished with temproal waisting  Calorie count is underway, blood glucose still fluctuation but appeared has improved some Blood pressure borderline low  Assessment & Plan:   Principal Problem:   Hypoglycemia Active Problems:   Type 2 diabetes mellitus with complication, with long-term current use of insulin (HCC)   Chronic systolic CHF (congestive heart failure) (HCC)   Constipation   Cachexia (HCC)   Malnutrition of moderate degree   Hyperkalemia   Sever hypoglycemia in the setting of insulin-dependent type 2 diabetes -Likely due to inadequate oral intake -continue to have highs and lows, decrease lantus and change to bid dosing, continue ssi, change from tid to q4hrs Continue adjust insulin Continue to have poor oral intake, borderline low bp, she received gentle hydration, will continue for another 24hrs  Random cortisol level 2.7 to 3.2  Test underway to rule out adrenal insufficiency  Hyperkalemia Unclear etiology Change to low potassium diet Continue Lokelma Per chart review free appear to be on Lokelma daily chronically  CKD 3 A Creatinine appear to be close to baseline  History of CVA with left-sided weakness , cognitive impairment  continue aspirin/Plavix/Lipitor  Nonischemic cardiomyopathy Was seen by heart  failure clinic on October 16, 2020 On ivabradine 2.5 mg twice daily due to persistent tachycardia She is deemed not a good candidate for SGLT2 inhibitor due to frequent UTIs, she is not a good candidate for beta-blocker and hydralazine due to low blood pressure she does not need diuretics regularly per cardiology notes,   Failure to thrive/malnutrition Patient was recently seen by palliative care during last hospitalization, at the time there is a discussion of hospice Will do calorie count, she does not appear to eat much at all I discussed with mother regarding goals of care, hospital course/labs/imaging briefly reviewed, per mother, per mother patient's memory got worse after cva in 03/2020, probably forgot to pay her insurance bill. mother is helping her  to get medicaid. I do not think patient will do well  on herself after discharge, she will need home health, assisted living, ect.. However if she does not have adequate oral intake , she may qualify for hospice, per mother patient has significant weight loss since after CVA in 03/2020 Will reconsult palliative care as well, mother agrees to palliative care consult   Nutritional Assessment: The patient's BMI is: Body mass index is 18.05 kg/m.Marland Kitchen Seen by dietician.  I agree with the assessment and plan as outlined below: Nutrition Status: Nutrition Problem: Moderate Malnutrition Etiology: chronic illness (CHF, CVA, DM) Signs/Symptoms: mild fat depletion, mild muscle depletion, moderate muscle depletion Interventions: Ensure Enlive (each supplement provides 350kcal and 20 grams of protein), Magic cup, MVI  .   Unresulted Labs (From admission, onward)     Start     Ordered   11/15/20 0500  ACTH Level  Tomorrow morning,   R       Question:  Specimen collection method  Answer:  Lab=Lab collect   11/14/20 1939   11/14/20 0500  Basic metabolic panel  Daily,   R     Question:  Specimen collection method  Answer:  Lab=Lab collect   11/13/20  1223              DVT prophylaxis:   Lovenox   Code Status: DNR Family Communication: mother over the phone on 7/7 Disposition:   Status is: Inpatient  Dispo: The patient is from: Home              Anticipated d/c is to: To be determined, need goals of care discussion              Anticipated d/c date is:  pending on oral intake, blood glucose still widely fluctuation due to inconsistent oral intake, needs goals of care discussion, awaiting for adrenal insufficency labs               Consultants:  Palliative care  Procedures:  None  Antimicrobials:   Anti-infectives (From admission, onward)    None           Objective: Vitals:   11/15/20 0350 11/15/20 0401 11/15/20 0700 11/15/20 1552  BP: 107/63 (!) 98/56 (!) 93/57 (!) 96/53  Pulse:  86 78 82  Resp: 16 16 15 17   Temp:  98.6 F (37 C) 98.3 F (36.8 C) 99.2 F (37.3 C)  TempSrc:  Oral Oral Oral  SpO2:  100%  100%  Weight:      Height:        Intake/Output Summary (Last 24 hours) at 11/15/2020 1657 Last data filed at 11/15/2020 0900 Gross per 24 hour  Intake 720 ml  Output 1200 ml  Net -480 ml   Filed Weights   11/02/20 1619 11/02/20 2248 11/04/20 0351  Weight: 42.6 kg 42.6 kg 47.7 kg    Examination:  General exam: appear more alert, interactive, aaox3, very hard of hearing ,  temporal wasting ,low muscle mass ,low fat content Respiratory system: Decreased at bases, no wheezing, no rales. Respiratory effort normal. Cardiovascular system: S1 & S2 heard, RRR. No JVD, no murmur, No pedal edema. Gastrointestinal system: Abdomen is nondistended, soft and nontender. Normal bowel sounds heard. Central nervous system: aaox3, slow to answer questions, does appear to have cognitive impairment Extremities: Generalized weakness Skin: No rashes, lesions or ulcers Psychiatry: flat affect, calm , no agitation    Data Reviewed: I have personally reviewed following labs and imaging studies  CBC: Recent Labs   Lab 11/11/20 0101 11/12/20 0056  WBC 5.3 6.6  NEUTROABS 2.9 4.0  HGB 10.0* 10.3*  HCT 30.2* 31.7*  MCV 94.7 94.9  PLT 279 277    Basic Metabolic Panel: Recent Labs  Lab 11/11/20 0648 11/11/20 1700 11/11/20 2123 11/12/20 0056 11/13/20 0223 11/14/20 0639 11/15/20 0810  NA 126* 127*  --   --  130* 134* 138  K 6.0* 5.2* 5.1 5.7* 4.6 4.4 3.9  CL 100 97*  --   --  101 105 104  CO2 21* 24  --   --  23 25 27   GLUCOSE 238* 281*  --   --  259* 144* 113*  BUN 38* 33*  --   --  35* 30* 28*  CREATININE 1.23* 1.38*  --   --  1.30* 1.21* 1.13*  CALCIUM 9.2 8.7*  --   --  8.9 8.8* 9.4    GFR: Estimated Creatinine Clearance: 40.9 mL/min (A) (by C-G formula based on SCr of 1.13 mg/dL (H)).  Liver Function Tests: No results for input(s): AST, ALT, ALKPHOS, BILITOT, PROT, ALBUMIN in the last 168 hours.  CBG: Recent Labs  Lab 11/14/20 2349 11/15/20 0404 11/15/20 0741 11/15/20 1157 11/15/20 1603  GLUCAP 98 254* 134* 359* 111*     No results found for this or any previous visit (from the past 240 hour(s)).        Radiology Studies: No results found.      Scheduled Meds:  aspirin EC  81 mg Oral Daily   atorvastatin  40 mg Oral QPM   clopidogrel  75 mg Oral Daily   dronabinol  5 mg Oral BID AC   enoxaparin (LOVENOX) injection  20 mg Subcutaneous Q24H   feeding supplement  237 mL Oral BID BM   insulin aspart  0-9 Units Subcutaneous Q4H   insulin glargine  6 Units Subcutaneous BID   ivabradine  2.5 mg Oral BID WC   mirtazapine  15 mg Oral QHS   multivitamin with minerals  1 tablet Oral Daily   sodium zirconium cyclosilicate  10 g Oral BID   Continuous Infusions:   LOS: 12 days   Time spent: 35 mins Greater than 50% of this time was spent in counseling, explanation of diagnosis, planning of further management, and coordination of care.   Voice Recognition Reubin Milan dictation system was used to create this note, attempts have been made to correct errors. Please  contact the author with questions and/or clarifications.   Albertine Grates, MD PhD FACP [facility]  Available via Epic secure chat 7am-7pm for nonurgent issues Please page for urgent issues To page the attending provider between 7A-7P or the covering provider during after hours 7P-7A, please log into the web site [email or web address] and access using universal [place] password for that web site. If you do not have the password, please call the hospital operator.    11/15/2020, 4:57 PM

## 2020-11-15 NOTE — Progress Notes (Signed)
Occupational Therapy Treatment Patient Details Name: Christine Benitez MRN: [id number] DOB: [birth year] Today's Date: 11/15/2020    History of present illness Pt is a 60 y/o female admitted 6/26 secondary to hypoglycemia and unresponsiveness with encephalopathy. PMH includes DM, CHF, CKD, cardiomyopathy, HTN, CVA,   OT comments  Pt ambulated to bathroom for toileting and then sink for grooming with RW and min guard assist. Remained up in chair after walking in hall and set up for lunch.   Follow Up Recommendations  Home health OT;Supervision/Assistance - 24 hour    Equipment Recommendations  None recommended by OT    Recommendations for Other Services      Precautions / Restrictions Precautions Precautions: Fall Precaution Comments: HOH, decreased vision       Mobility Bed Mobility Overal bed mobility: Modified Independent                  Transfers Overall transfer level: Needs assistance Equipment used: Rolling walker (2 wheeled) Transfers: Sit to/from Stand Sit to Stand: Supervision         General transfer comment: supervision for safety    Balance Overall balance assessment: Needs assistance Sitting-balance support: No upper extremity supported;Feet supported Sitting balance-Leahy Scale: Good     Standing balance support: During functional activity;Bilateral upper extremity supported Standing balance-Leahy Scale: Poor                             ADL either performed or assessed with clinical judgement   ADL Overall ADL's : Needs assistance/impaired Eating/Feeding: Sitting;Minimal assistance Eating/Feeding Details (indicate cue type and reason): cut foot and set up tray for pt Grooming: Wash/dry hands;Standing;Min guard                   Toilet Transfer: Min guard;Ambulation;Regular Toilet;Grab bars   Toileting- Clothing Manipulation and Hygiene: Minimal assistance;Sit to/from stand       Functional mobility during ADLs: Copywriter, advertising     Praxis      Cognition Arousal/Alertness: Awake/alert Behavior During Therapy: Flat affect Overall Cognitive Status: History of cognitive impairments - at baseline                                          Exercises     Shoulder Instructions       General Comments      Pertinent Vitals/ Pain       Pain Assessment: No/denies pain  Home Living                                          Prior Functioning/Environment              Frequency  Min 2X/week        Progress Toward Goals  OT Goals(current goals can now be found in the care plan section)  Progress towards OT goals: Progressing toward goals  Acute Rehab OT Goals Patient Stated Goal: to go home OT Goal Formulation: With patient Time For Goal Achievement: 11/19/20 Potential to Achieve Goals: Good  Plan Discharge plan remains appropriate;Frequency remains appropriate    Co-evaluation  AM-PAC OT "6 Clicks" Daily Activity     Outcome Measure   Help from another person eating meals?: A Little Help from another person taking care of personal grooming?: A Little Help from another person toileting, which includes using toliet, bedpan, or urinal?: A Little Help from another person bathing (including washing, rinsing, drying)?: A Little Help from another person to put on and taking off regular upper body clothing?: A Little Help from another person to put on and taking off regular lower body clothing?: A Little 6 Click Score: 18    End of Session Equipment Utilized During Treatment: Gait belt;Rolling walker  OT Visit Diagnosis: Muscle weakness (generalized) (M62.81)   Activity Tolerance Patient tolerated treatment well   Patient Left in chair;with call bell/phone within reach;with chair alarm set   Nurse Communication          Time: 1209-1229 OT Time Calculation (min): 20  min  Charges: OT General Charges $OT Visit: 1 Visit OT Treatments $Self Care/Home Management : 8-22 mins  Martie Round, OTR/L Acute Rehabilitation Services Pager: [phone] Office: [phone]   Evern Bio 11/15/2020, 1:13 PM

## 2020-11-15 NOTE — Progress Notes (Signed)
Mobility Specialist: Progress Note   11/15/20 1750  Mobility  Activity Ambulated in hall  Level of Assistance Contact guard assist, steadying assist  Assistive Device Front wheel walker  Distance Ambulated (ft) 230 ft  Mobility Ambulated with assistance in hallway  Mobility Response Tolerated well  Mobility performed by Mobility specialist  $Mobility charge 1 Mobility   Pre-Mobility: 72 HR Post-Mobility: 71 HR  Pt asx during ambulation. Pt back to bed after walk with call bell at her side.   [facility] Eian Vandervelden Mobility Specialist Mobility Specialist Phone: [phone]

## 2020-11-16 DIAGNOSIS — Z794 Long term (current) use of insulin: Secondary | ICD-10-CM

## 2020-11-16 DIAGNOSIS — E118 Type 2 diabetes mellitus with unspecified complications: Secondary | ICD-10-CM

## 2020-11-16 DIAGNOSIS — Z7189 Other specified counseling: Secondary | ICD-10-CM

## 2020-11-16 LAB — GLUCOSE, CAPILLARY
Glucose-Capillary: 272 mg/dL — ABNORMAL HIGH (ref 70–99)
Glucose-Capillary: 160 mg/dL — ABNORMAL HIGH (ref 70–99)
Glucose-Capillary: 248 mg/dL — ABNORMAL HIGH (ref 70–99)
Glucose-Capillary: 293 mg/dL — ABNORMAL HIGH (ref 70–99)
Glucose-Capillary: 255 mg/dL — ABNORMAL HIGH (ref 70–99)

## 2020-11-16 LAB — BASIC METABOLIC PANEL
Anion gap: 7 (ref 5–15)
BUN: 36 mg/dL — ABNORMAL HIGH (ref 6–20)
CO2: 28 mmol/L (ref 22–32)
Calcium: 9 mg/dL (ref 8.9–10.3)
Chloride: 100 mmol/L (ref 98–111)
Creatinine, Ser: 1.32 mg/dL — ABNORMAL HIGH (ref 0.44–1.00)
GFR, Estimated: 47 mL/min — ABNORMAL LOW (ref >60)
Glucose, Bld: 372 mg/dL — ABNORMAL HIGH (ref 70–99)
Potassium: 4.4 mmol/L (ref 3.5–5.1)
Sodium: 135 mmol/L (ref 135–145)

## 2020-11-16 MED ORDER — INSULIN ASPART 100 UNIT/ML IJ SOLN: 3.0000 [IU] | Freq: Three times a day (TID) | INTRAMUSCULAR | Status: DC

## 2020-11-16 MED ORDER — SODIUM CHLORIDE 0.9 % IV SOLN: INTRAVENOUS | Status: AC

## 2020-11-16 MED ORDER — INSULIN GLARGINE 100 UNIT/ML ~~LOC~~ SOLN
8.0000 [IU] | Freq: Two times a day (BID) | SUBCUTANEOUS | Status: DC
  Administered 2020-11-16 (×2): 8 [IU] via SUBCUTANEOUS
  Filled 2020-11-16 (×5): qty 0.08

## 2020-11-16 NOTE — Progress Notes (Addendum)
PROGRESS NOTE    Christine Benitez  RAQ:[id number] DOB: 12/19/1960 DOA: 11/02/2020 PCP: Lupita Dawn, MD    Chief Complaint  Patient presents with   Hypoglycemia    Brief Narrative:  60 year old female with brittle diabetes, cardiomyopathy, chronic kidney disease, admitted to the hospital with hypoglycemia and acute metabolic encephalopathy.  Recent admission for urinary tract infection which she was discharged on 6/21  Subjective:  I have yet to see her out bed , she appears has been in bed except when PT/OT /mobility tach comes to see her She appears weak , drowsy,  she is very hard of hearing , she does not participate in conversation much, she sleeps most of the day  She is aaox3 when she is awake She appear very malnurished with temproal waisting  Calorie count is underway, blood glucose still fluctuation but appeared has improved some Blood pressure borderline low, she remained on ivf  Assessment & Plan:   Principal Problem:   Hypoglycemia Active Problems:   Type 2 diabetes mellitus with complication, with long-term current use of insulin (HCC)   Chronic systolic CHF (congestive heart failure) (HCC)   Constipation   Cachexia (HCC)   Malnutrition of moderate degree   Hyperkalemia   Sever hypoglycemia in the setting of insulin-dependent type 2 diabetes -Likely due to inadequate oral intake -continue to have highs and lows, but overall appear to have elevated blood glucose now, increase lantus , continue to adjust insulin, continue cbg checking q4hrs -Continue to have poor oral intake, borderline low bp, she received gentle hydration, will continue for another 24hrs  Hyponatremia: Sodium 126 on presentation Sodium normalized with hydration   Hyperkalemia Unclear etiology, adrenal insufficiency work up underway Change to low potassium diet Continue Lokelma, Per chart review free appear to be on Lokelma daily chronically  Random cortisol level 2.7 to 3.2  ACTH level  in process, Test underway to rule out adrenal insufficiency She does not appear to have primary adrenal insufficiency as there is appropriate increase of cortisol on consyntropin stim test  CKD 3 A Creatinine appear to be close to baseline  History of CVA with left-sided weakness , cognitive impairment  continue aspirin/Plavix/Lipitor  Nonischemic cardiomyopathy Was seen by heart failure clinic on October 16, 2020 On ivabradine 2.5 mg twice daily due to persistent tachycardia She is deemed not a good candidate for SGLT2 inhibitor due to frequent UTIs, she is not a good candidate for beta-blocker and hydralazine due to low blood pressure she does not need diuretics regularly per cardiology notes,   Failure to thrive/malnutrition Patient was recently seen by palliative care during last hospitalization, at the time there is a discussion of hospice calorie count underway, she does not appear to eat much at all per mother patient's memory got worse after cva in 03/2020, probably forgot to pay her insurance bill. mother is helping her  to get medicaid. I do not think patient will do well  on her own after discharge  if she does not have adequate oral intake , she may qualify for hospice, per mother patient has significant weight loss since after CVA in 03/2020 palliative care consulted   Nutritional Assessment: The patient's BMI is: Body mass index is 18.05 kg/m.Marland Kitchen Seen by dietician.  I agree with the assessment and plan as outlined below: Nutrition Status: Nutrition Problem: Moderate Malnutrition Etiology: chronic illness (CHF, CVA, DM) Signs/Symptoms: mild fat depletion, mild muscle depletion, moderate muscle depletion Interventions: Ensure Enlive (each supplement provides 350kcal and 20  grams of protein), Magic cup, MVI  .   Unresulted Labs (From admission, onward)     Start     Ordered   11/17/20 0500  Comprehensive metabolic panel  Tomorrow morning,   R       Question:  Specimen  collection method  Answer:  Lab=Lab collect   11/16/20 0927   11/15/20 0500  ACTH Level  Tomorrow morning,   R       Question:  Specimen collection method  Answer:  Lab=Lab collect   11/14/20 1939              DVT prophylaxis:   Lovenox   Code Status: DNR Family Communication: mother over the phone on 7/7 Disposition:   Status is: Inpatient  Dispo: The patient is from: Home              Anticipated d/c is to: To be determined, need goals of care discussion              Anticipated d/c date is:  pending on oral intake, blood glucose still widely fluctuation due to inconsistent oral intake, needs goals of care discussion, awaiting for adrenal insufficency labs               Consultants:  Palliative care  Procedures:  None  Antimicrobials:   Anti-infectives (From admission, onward)    None           Objective: Vitals:   11/15/20 1930 11/16/20 0000 11/16/20 0400 11/16/20 0753  BP: 133/75 (!) 110/58 108/60 115/66  Pulse: 70 88 78 73  Resp: 16 16 18 18   Temp: 98.9 F (37.2 C) 98.9 F (37.2 C) 98.2 F (36.8 C) 98.2 F (36.8 C)  TempSrc: Oral Oral Oral Oral  SpO2: 100% 100% 100% 99%  Weight:      Height:        Intake/Output Summary (Last 24 hours) at 11/16/2020 0934 Last data filed at 11/16/2020 0039 Gross per 24 hour  Intake --  Output 800 ml  Net -800 ml   Filed Weights   11/02/20 1619 11/02/20 2248 11/04/20 0351  Weight: 42.6 kg 42.6 kg 47.7 kg    Examination:  General exam: drowsy, very hard of hearing ,  temporal wasting ,low muscle mass ,low fat content Respiratory system: Decreased at bases, no wheezing, no rales. Respiratory effort normal. Cardiovascular system: S1 & S2 heard, RRR. No JVD, no murmur, No pedal edema. Gastrointestinal system: Abdomen is nondistended, soft and nontender. Normal bowel sounds heard. Central nervous system: drowsy  Extremities: Generalized weakness Skin: No rashes, lesions or ulcers Psychiatry: flat affect  when awake, no agitation    Data Reviewed: I have personally reviewed following labs and imaging studies  CBC: Recent Labs  Lab 11/11/20 0101 11/12/20 0056  WBC 5.3 6.6  NEUTROABS 2.9 4.0  HGB 10.0* 10.3*  HCT 30.2* 31.7*  MCV 94.7 94.9  PLT 279 277    Basic Metabolic Panel: Recent Labs  Lab 11/11/20 1700 11/11/20 2123 11/12/20 0056 11/13/20 0223 11/14/20 0639 11/15/20 0810 11/16/20 0214  NA 127*  --   --  130* 134* 138 135  K 5.2*   < > 5.7* 4.6 4.4 3.9 4.4  CL 97*  --   --  101 105 104 100  CO2 24  --   --  23 25 27 28   GLUCOSE 281*  --   --  259* 144* 113* 372*  BUN 33*  --   --  35* 30* 28* 36*  CREATININE 1.38*  --   --  1.30* 1.21* 1.13* 1.32*  CALCIUM 8.7*  --   --  8.9 8.8* 9.4 9.0   < > = values in this interval not displayed.    GFR: Estimated Creatinine Clearance: 35 mL/min (A) (by C-G formula based on SCr of 1.32 mg/dL (H)).  Liver Function Tests: No results for input(s): AST, ALT, ALKPHOS, BILITOT, PROT, ALBUMIN in the last 168 hours.  CBG: Recent Labs  Lab 11/15/20 1157 11/15/20 1603 11/15/20 2026 11/15/20 2310 11/16/20 0352  GLUCAP 359* 111* 285* 295* 272*     No results found for this or any previous visit (from the past 240 hour(s)).        Radiology Studies: No results found.      Scheduled Meds:  aspirin EC  81 mg Oral Daily   atorvastatin  40 mg Oral QPM   clopidogrel  75 mg Oral Daily   dronabinol  5 mg Oral BID AC   enoxaparin (LOVENOX) injection  20 mg Subcutaneous Q24H   feeding supplement  237 mL Oral BID BM   insulin aspart  0-9 Units Subcutaneous Q4H   insulin glargine  8 Units Subcutaneous BID   ivabradine  2.5 mg Oral BID WC   mirtazapine  15 mg Oral QHS   multivitamin with minerals  1 tablet Oral Daily   sodium zirconium cyclosilicate  10 g Oral BID   Continuous Infusions:  sodium chloride 75 mL/hr at 11/16/20 0505     LOS: 13 days   Time spent: 35 mins Greater than 50% of this time was spent in  counseling, explanation of diagnosis, planning of further management, and coordination of care.   Voice Recognition Reubin Milan dictation system was used to create this note, attempts have been made to correct errors. Please contact the author with questions and/or clarifications.   Albertine Grates, MD PhD FACP [facility]  Available via Epic secure chat 7am-7pm for nonurgent issues Please page for urgent issues To page the attending provider between 7A-7P or the covering provider during after hours 7P-7A, please log into the web site [email or web address] and access using universal [place] password for that web site. If you do not have the password, please call the hospital operator.    11/16/2020, 9:34 AM

## 2020-11-16 NOTE — Consult Note (Signed)
Consultation Note Date: 11/16/2020   Patient Name: Christine Benitez  DOB: 07-08-1960  MRN: [id number]  Age / Sex: 60 y.o., female  PCP: Claiborne Billings, MD Referring Physician: Florencia Reasons, MD  Reason for Consultation: Establishing goals of care  HPI/Patient Profile: 60 y.o. female  with past medical history of insulin-dependent diabetes, chronic systolic CHF, nonischemic cardiomyopathy, pulmonary hypertension, CVA, hypertension, CKD stage IIIa, failure to thrive, admitted on 11/02/2020 with severe hypoglycemia and unresponsiveness.   Patient was recently admitted 10/24/2020 and discharged on 10/28/2020. Palliative medicine has been consulted to assist with goals of care conversation.   Clinical Assessment and Goals of Care:  I have reviewed medical records including EPIC notes, labs and imaging, assessed the patient and met at the bedside to discuss diagnosis, prognosis, GOC, EOL wishes, disposition and options.  I introduced Palliative Medicine as specialized medical care for people living with serious illness. It focuses on providing relief from the symptoms and stress of a serious illness. The goal is to improve quality of life for both the patient and the family.  I attempted to elicit values and goals of care important to the patient.   Patient affect is flat and she is minimally engaged in conversation. Does not answer questions about pain, distress, or fatigue. When asked if she needs assistance with her lunch, she states "I don't want that." I informed patient that PMT will be available to provide support, provided her with contact information, and politely excused myself to allow the patient time to rest. Informed her that I would return tomorrow for a discussion at a better time.  I then called patient's mother [name] and introduced palliative medicine. She is very appreciative of the additional support, as she  is currently caring for multiple family members and has her own health conditions as well. She tells me that she is working to obtain disability benefits and medicaid for [employer]. 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 She states that Rynlee has always been this way, but this has worsened since her stroke in November 2021. She describes Felicia as "hard-headed." Arielis currently lives with her mother [name] and brother, who also provide assistance with Dymphna's care. Patient's daughter recently returned to [name]'s home to be cared for as well after her hospitalization in [place]. [name] states that the patient only wants to eat foods such as fried chicken and mac and cheese, refusing healthy options that she provides. She remains willing to take Kiari back home upon discharge and continue encouraging her while hoping for improvement. She reflects that "we need to do what we have to do, and pray for God to give her strength."  Hospice and Palliative Care services outpatient were explained and offered. [name] believes that palliative care would be helpful, but agrees that Jina must have the strength and motivation to participate in order for improvement to occur. [name] also believes that hospice services at home would be helpful  if the patient is ready to make this decision.  Discussed the importance of continued conversation with family and the medical providers regarding overall plan of care and treatment options, ensuring decisions are within the context of the patient's values and GOCs.    Questions and concerns were addressed. The family was encouraged to call with questions or concerns.  PMT will continue to support holistically.     SUMMARY OF RECOMMENDATIONS   -DNR confirmed -Ongoing goals of care discussions as patient allows -Patient's mother is willing to take her home upon discharge, TBD whether patient is  interested in outpatient palliative care or home hospice -Psychosocial and emotional support provided  Code Status/Advance Care Planning: DNR  Palliative Prophylaxis:  Delirium Protocol and Turn Reposition  Additional Recommendations (Limitations, Scope, Preferences): Ongoing discussions  Psycho-social/Spiritual:  Desire for further Chaplaincy support:TBD Additional Recommendations: Caregiving  Support/Resources and Education on Hospice  Prognosis:  Unable to determine  Discharge Planning:  Home, services to be determined       Primary Diagnoses: Present on Admission:  Hypoglycemia  Chronic systolic CHF (congestive heart failure) ([place])   I have reviewed the medical record, interviewed the patient and family, and examined the patient. The following aspects are pertinent.  Past Medical History:  Diagnosis Date   Abnormal LFTs    a. 05/2014 -  ALT 52, alk phos 130.   Chronic systolic CHF (congestive heart failure) ([place])    a. Dx 05/2014 - echo at [facility] - EF 30-35%, moderate LVH, no rWMA, mild to moderate MR, moderate PH with PA pressure 60-12mHg, mild to moderate pericardial effusion. EF 30-35% by cath.   History of blood transfusion 1986   "related to C-section"   Hypertension    Insulin dependent diabetes mellitus    Iron deficiency anemia    NICM (nonischemic cardiomyopathy) ([facility]    a. [facility]16:  minor nonobstructive CAD with mild irregularities in the LAD less than 10-20%, LCx with mild disease less than 20%, no significant disease in the RCA, EF 30-35%.   Pericardial effusion    [facility]. [facility]   Pulmonary hypertension [facility]    [facility]   Tobacco abuse    Social History   Socioeconomic History   Marital status: Divorced    Spouse name: Not on file   Number of children: Not on file   Years of education: Not on file   Highest education level: Not on file  Occupational History   Not on  file  Tobacco Use   Smoking status: Former    Packs/day: 0.50    Years: 22.00    Pack years: 11.00    Types: Cigarettes    Quit date: 03/26/2015    Years since quitting: 5.6   Smokeless tobacco: Never  Vaping Use   Vaping Use: Never used  Substance and Sexual Activity   Alcohol use: No   Drug use: No   Sexual activity: Yes    Birth control/protection: None  Other Topics Concern   Not on file  Social History Narrative   Not on file   Social Determinants of Health   Financial Resource Strain: Not on file  Food Insecurity: Not on file  Transportation Needs: Not on file  Physical Activity: Not on file  Stress: Not on file  Social Connections: Not on file   Family History  Problem Relation Age of Onset   Coronary artery disease Mother  Diabetes Mother    Lupus Mother    Stroke Mother    Lung cancer Father    Scheduled Meds:  aspirin EC  81 mg Oral Daily   atorvastatin  40 mg Oral QPM   clopidogrel  75 mg Oral Daily   dronabinol  5 mg Oral BID AC   enoxaparin (LOVENOX) injection  20 mg Subcutaneous Q24H   feeding supplement  237 mL Oral BID BM   insulin aspart  0-9 Units Subcutaneous Q4H   insulin glargine  8 Units Subcutaneous BID   ivabradine  2.5 mg Oral BID WC   mirtazapine  15 mg Oral QHS   multivitamin with minerals  1 tablet Oral Daily   sodium zirconium cyclosilicate  10 g Oral BID   Continuous Infusions:  sodium chloride 75 mL/hr at 11/16/20 0949   PRN Meds:.acetaminophen **OR** acetaminophen, polyethylene glycol Medications Prior to Admission:  Prior to Admission medications   Medication Sig Start Date End Date Taking? Authorizing Provider  albuterol (VENTOLIN HFA) 108 (90 Base) MCG/ACT inhaler Inhale 2 puffs into the lungs every 6 (six) hours as needed for wheezing or shortness of breath. 10/28/20  Yes Allie Bossier, MD  aspirin 81 MG EC tablet TAKE 1 TABLET (81 MG TOTAL) BY MOUTH DAILY. 09/10/14  Yes Martin[place], Peter M, MD  atorvastatin (LIPITOR) 40  MG tablet Take 1 tablet (40 mg total) by mouth every evening. 03/26/20  Yes Sanjuan Dame, MD  clopidogrel (PLAVIX) 75 MG tablet Take 1 tablet (75 mg total) by mouth daily. 10/28/20 10/28/21 Yes Allie Bossier, MD  dronabinol (MARINOL) 5 MG capsule Take 1 capsule (5 mg total) by mouth 2 (two) times daily before lunch and supper. 10/28/20  Yes Allie Bossier, MD  insulin aspart (NOVOLOG FLEXPEN) 100 UNIT/ML FlexPen Inject 10 Units into the skin 3 (three) times daily with meals. 10/28/20  Yes Allie Bossier, MD  insulin glargine (LANTUS) 100 UNIT/ML Solostar Pen Inject 24 Units into the skin daily. 10/28/20  Yes Allie Bossier, MD  Insulin Pen Needle 32G X 4 MM MISC use as directed 10/28/20  Yes Allie Bossier, MD  ivabradine (CORLANOR) 5 MG TABS tablet Take 0.5 tablets (2.5 mg total) by mouth 2 (two) times daily with a meal. 10/16/20  Yes Lyda Jester M, PA-C  mirtazapine (REMERON SOL-TAB) 15 MG disintegrating tablet Take 0.5 tablets (7.5 mg total) by mouth at bedtime. 03/26/20  Yes Sanjuan Dame, MD  feeding supplement (ENSURE ENLIVE / ENSURE PLUS) LIQD Take 237 mLs by mouth 2 (two) times daily between meals. 11/06/20   Hosie Poisson, MD  insulin glargine (LANTUS) 100 UNIT/ML injection Inject 0.05 mLs (5 Units total) into the skin daily. 11/07/20   Hosie Poisson, MD  Multiple Vitamin (MULTIVITAMIN WITH MINERALS) TABS tablet Take 1 tablet by mouth daily. 11/07/20   Hosie Poisson, MD   No Known Allergies Review of Systems  All other systems reviewed and are negative.  Physical Exam Vitals and nursing note reviewed.  Constitutional:      General: She is not in acute distress.    Appearance: She is cachectic.  Cardiovascular:     Rate and Rhythm: Normal rate.  Pulmonary:     Effort: Pulmonary effort is normal.  Neurological:     Mental Status: She is alert.  Psychiatric:        Mood and Affect: Affect is flat.        Behavior: Behavior is withdrawn.    Vital Signs:  BP 124/70 (BP  Location: Left Arm)   Pulse 69   Temp 98.3 F (36.8 C) (Oral)   Resp 17   Ht _0  (1.626 m)   Wt 47.7 kg   SpO2 99%   BMI 18.05 kg/m  Pain Scale: 0-10 POSS *See Group Information*: 1-Acceptable,Awake and alert Pain Score: 0-No pain   SpO2: SpO2: 99 % O2 Device:SpO2: 99 % O2 Flow Rate: .O2 Flow Rate (L/min): 0 L/min  IO: Intake/output summary:  Intake/Output Summary (Last 24 hours) at 11/16/2020 1316 Last data filed at 11/16/2020 0039 Gross per 24 hour  Intake --  Output 800 ml  Net -800 ml    LBM: Last BM Date: 11/12/20 Baseline Weight: Weight: 42.6 kg Most recent weight: Weight: 47.7 kg     Palliative Assessment/Data:     Time In: 12:45PM Time Out: 1:35PM Time Total: 50 minutes Greater than 50% of this time was spent in counseling and coordinating care related to the above assessment and plan.  Dorthy Cooler, PA-C Palliative Medicine Team Team phone # [phone]  Thank you for allowing the Palliative Medicine Team to assist in the care of this patient. Please utilize secure chat with additional questions, if there is no response within 30 minutes please call the above phone number.  Palliative Medicine Team providers are available by phone from 7am to 7pm daily and can be reached through the team cell phone.  Should this patient require assistance outside of these hours, please call the patient's attending physician.

## 2020-11-17 LAB — CBC
HCT: 27.2 % — ABNORMAL LOW (ref 36.0–46.0)
Hemoglobin: 8.9 g/dL — ABNORMAL LOW (ref 12.0–15.0)
MCH: 31.4 pg (ref 26.0–34.0)
MCHC: 32.7 g/dL (ref 30.0–36.0)
MCV: 96.1 fL (ref 80.0–100.0)
Platelets: 254 10*3/uL (ref 150–400)
RBC: 2.83 MIL/uL — ABNORMAL LOW (ref 3.87–5.11)
RDW: 13.1 % (ref 11.5–15.5)
WBC: 7.3 10*3/uL (ref 4.0–10.5)
nRBC: 0 % (ref 0.0–0.2)

## 2020-11-17 LAB — COMPREHENSIVE METABOLIC PANEL
ALT: 32 U/L (ref 0–44)
AST: 23 U/L (ref 15–41)
Albumin: 2.8 g/dL — ABNORMAL LOW (ref 3.5–5.0)
Alkaline Phosphatase: 49 U/L (ref 38–126)
Anion gap: 6 (ref 5–15)
BUN: 29 mg/dL — ABNORMAL HIGH (ref 6–20)
CO2: 26 mmol/L (ref 22–32)
Calcium: 8.8 mg/dL — ABNORMAL LOW (ref 8.9–10.3)
Chloride: 103 mmol/L (ref 98–111)
Creatinine, Ser: 1.15 mg/dL — ABNORMAL HIGH (ref 0.44–1.00)
GFR, Estimated: 55 mL/min — ABNORMAL LOW (ref >60)
Glucose, Bld: 247 mg/dL — ABNORMAL HIGH (ref 70–99)
Potassium: 4 mmol/L (ref 3.5–5.1)
Sodium: 135 mmol/L (ref 135–145)
Total Bilirubin: 0.6 mg/dL (ref 0.3–1.2)
Total Protein: 5.7 g/dL — ABNORMAL LOW (ref 6.5–8.1)

## 2020-11-17 LAB — GLUCOSE, CAPILLARY
Glucose-Capillary: 142 mg/dL — ABNORMAL HIGH (ref 70–99)
Glucose-Capillary: 168 mg/dL — ABNORMAL HIGH (ref 70–99)
Glucose-Capillary: 218 mg/dL — ABNORMAL HIGH (ref 70–99)
Glucose-Capillary: 234 mg/dL — ABNORMAL HIGH (ref 70–99)
Glucose-Capillary: 247 mg/dL — ABNORMAL HIGH (ref 70–99)
Glucose-Capillary: 254 mg/dL — ABNORMAL HIGH (ref 70–99)

## 2020-11-17 LAB — MAGNESIUM: Magnesium: 1.9 mg/dL (ref 1.7–2.4)

## 2020-11-17 LAB — ACTH: C206 ACTH: 29.7 pg/mL (ref 7.2–63.3)

## 2020-11-17 MED ORDER — ENSURE ENLIVE PO LIQD
237.0000 mL | Freq: Three times a day (TID) | ORAL | Status: DC
  Administered 2020-11-17: 237 mL via ORAL

## 2020-11-17 MED ORDER — INSULIN ASPART 100 UNIT/ML IJ SOLN
8.0000 [IU] | Freq: Three times a day (TID) | INTRAMUSCULAR | Status: DC
  Administered 2020-11-17 – 2020-11-18 (×3): 8 [IU] via SUBCUTANEOUS

## 2020-11-17 MED ORDER — INSULIN ASPART 100 UNIT/ML IJ SOLN
0.0000 [IU] | Freq: Three times a day (TID) | INTRAMUSCULAR | Status: DC
  Administered 2020-11-17: 1 [IU] via SUBCUTANEOUS
  Administered 2020-11-17 – 2020-11-18 (×3): 3 [IU] via SUBCUTANEOUS
  Administered 2020-11-18: 1 [IU] via SUBCUTANEOUS

## 2020-11-17 MED ORDER — INSULIN GLARGINE 100 UNIT/ML ~~LOC~~ SOLN
10.0000 [IU] | Freq: Two times a day (BID) | SUBCUTANEOUS | Status: DC
  Administered 2020-11-17 – 2020-11-18 (×2): 10 [IU] via SUBCUTANEOUS
  Filled 2020-11-17 (×3): qty 0.1

## 2020-11-17 NOTE — Progress Notes (Signed)
Results for AHRIA, SLAPPEY (MRN [id number]) as of 11/17/2020 10:14  Ref. Range 11/16/2020 12:14 11/16/2020 16:43 11/16/2020 20:07 11/17/2020 00:17 11/17/2020 04:54  Glucose-Capillary Latest Ref Range: 70 - 99 mg/dL 727 (H) 618 (H) 485 (H) 254 (H) 234 (H)  Noted that blood sugars have been greater than 200 mg/dl.  Recommend increasing Lantus to 10 units BID if blood sugars continue to be elevated.   Smith Mince RN BSN CDE Diabetes Coordinator Pager: [phone]  8am-5pm

## 2020-11-17 NOTE — Progress Notes (Signed)
Daily Progress Note   Patient Name: Christine Benitez       Date: 11/17/2020 DOB: [birth year]  Age: 60 y.o. MRN#: [id number] Attending Physician: Pennie Banter, DO Primary Care Physician: Lupita Dawn, MD Admit Date: 11/02/2020  Reason for Consultation/Follow-up: Establishing goals of care  Subjective: Medical records reviewed. Assessed patient at the bedside. She is eating bits of her lunch, in no acute distress. She is hard of hearing.  We discussed her current illness and Christine Benitez states "sometimes I'm hungry and sometimes I'm not." Created space and opportunity for her thoughts on her current illness. She is still quite reserved but she confirms that she would like to get stronger and healthier to better care for herself. Discussed the implications of upcoming results for calorie count, particularly potential eligibility for hospice if her intake is inadequate.   Christine Benitez shares that she has some knowledge of hospice but shakes her head no at this concept. Hospice and palliative care services outpatient were explained and offered. Christine Benitez is willing to receive palliative care services and understands that hospice is available if she ever decides that she would like to pursue options such as comfort feeds. Counseled on the importance of oral intake and a healthy diet to achieve her stated goal of strengthening and prolonging her life.   I then called patient's mother who was unavailable. I did speak with patient's brother Christine Benitez to provide updates and support. He shares the concerns of the medical team and the patient's mother. He is also glad to hear that Christine Benitez has verbalized some willingness to participate in rehab and a desire to improve her health. He shares that he and his mother are currently trying to  motivate patient's daughter to actively participate in her care since her recent discharge from the hospital. Christine Benitez states "I love life and living, but we can't force other people to feel that way." We discussed the plan to proceed with pending outpatient palliative care referral. He understands that hospice is an option for further discussion if patient's goals change in the future.  Questions and concerns addressed. PMT will continue to support holistically.  Length of Stay: 14  Current Medications: Scheduled Meds:   aspirin EC  81 mg Oral Daily   atorvastatin  40 mg Oral QPM   clopidogrel  75 mg Oral  Daily   dronabinol  5 mg Oral BID AC   enoxaparin (LOVENOX) injection  20 mg Subcutaneous Q24H   feeding supplement  237 mL Oral TID BM   insulin aspart  0-9 Units Subcutaneous TID WC   insulin aspart  8 Units Subcutaneous TID WC   insulin glargine  10 Units Subcutaneous BID   ivabradine  2.5 mg Oral BID WC   mirtazapine  15 mg Oral QHS   multivitamin with minerals  1 tablet Oral Daily   sodium zirconium cyclosilicate  10 g Oral BID    Continuous Infusions:   PRN Meds: acetaminophen **OR** acetaminophen, polyethylene glycol  Physical Exam Vitals and nursing note reviewed.  Constitutional:      General: She is not in acute distress.    Appearance: She is cachectic.  HENT:     Right Ear: Decreased hearing noted.     Left Ear: Decreased hearing noted.  Cardiovascular:     Rate and Rhythm: Normal rate.  Pulmonary:     Effort: Pulmonary effort is normal.  Neurological:     Mental Status: She is alert. Mental status is at baseline.            Vital Signs: BP 128/73 (BP Location: Left Arm)   Pulse 74   Temp 98.3 F (36.8 C) (Oral)   Resp 13   Ht 5\' 4"  (1.626 m)   Wt 47.7 kg   SpO2 100%   BMI 18.05 kg/m  SpO2: SpO2: 100 % O2 Device: O2 Device: Room Air O2 Flow Rate: O2 Flow Rate (L/min): 0 L/min  Intake/output summary:  Intake/Output Summary (Last 24 hours) at  11/17/2020 1517 Last data filed at 11/17/2020 0815 Gross per 24 hour  Intake 1287.91 ml  Output 2025 ml  Net -737.09 ml   LBM: Last BM Date: 11/12/20 Baseline Weight: Weight: 42.6 kg Most recent weight: Weight: 47.7 kg       Palliative Assessment/Data: 50%      Patient Active Problem List   Diagnosis Date Noted   Hyperkalemia 11/11/2020   Constipation 11/03/2020   Cachexia (HCC) 11/03/2020   Malnutrition of moderate degree 11/03/2020   Hypoglycemia 11/02/2020   Proteus mirabilis infection 10/25/2020   Acute lower UTI 10/24/2020   Urinary tract infection 10/24/2020   Failure to thrive in adult 10/24/2020   Pericardial effusion    Iron deficiency anemia    Chronic systolic CHF (congestive heart failure) (HCC)    Heart failure with reduced ejection fraction (HCC) 07/18/2020   History of CVA (cerebrovascular accident) 07/18/2020   Nonischemic cardiomyopathy (HCC) 07/18/2020   Type 2 diabetes mellitus with complication, with long-term current use of insulin (HCC) 07/18/2020   Tobacco use 07/18/2020   Mixed hyperlipidemia 07/18/2020   Abnormal echocardiogram 07/18/2020   Resides in long term care facility 06/20/2020   Protein-calorie malnutrition, severe 03/24/2020   Pain    Altered mental status    DNR (do not resuscitate)    DNI (do not intubate)    Encounter for hospice care discussion    Goals of care, counseling/discussion    Palliative care by specialist    AKI (acute kidney injury) (HCC)    Cardiogenic shock (HCC) 03/14/2020   Acute urinary retention 03/13/2020   Chronic cholecystitis with calculus 03/12/2020   Cerebral thrombosis with cerebral infarction 03/12/2020   Abnormal LFTs    Hypertension    NICM (nonischemic cardiomyopathy) (HCC)    Pulmonary hypertension (HCC)    Tobacco abuse 06/10/2014  Diabetes mellitus (HCC) 06/10/2014   Family history of premature CAD 06/10/2014   History of blood transfusion 1986    Palliative Care Assessment & Plan    Patient Profile: 60 y.o. female  with past medical history of insulin-dependent diabetes, chronic systolic CHF, nonischemic cardiomyopathy, pulmonary hypertension, CVA, hypertension, CKD stage IIIa, failure to thrive, admitted on 11/02/2020 with severe hypoglycemia and unresponsiveness.    Patient was recently admitted 10/24/2020 and discharged on 10/28/2020. Palliative medicine has been consulted to assist with goals of care conversation.   Assessment: -Severe hypoglycemia in the setting of insulin-dependent type 2 diabetes, improved but with poor oral intake -History of CVA with left-sided weakness , cognitive impairment -Failure to thrive/malnutrition  Recommendations/Plan: Patient is not ready for hospice at this time, agrees to keep pending [facility] outpatient palliative care referral in place Patient is agreeable to participating in therapies with home health Psychosocial and emotional support provided for patient and family  Goals of Care and Additional Recommendations: Limitations on Scope of Treatment: No Artificial Feeding  Code Status: DNR   Code Status Orders  (From admission, onward)           Start     Ordered   11/03/20 0101  Do not attempt resuscitation (DNR)  Continuous       Question Answer Comment  In the event of cardiac or respiratory ARREST Do not call a "code blue"   In the event of cardiac or respiratory ARREST Do not perform Intubation, CPR, defibrillation or ACLS   In the event of cardiac or respiratory ARREST Use medication by any route, position, wound care, and other measures to relive pain and suffering. May use oxygen, suction and manual treatment of airway obstruction as needed for comfort.      11/03/20 0102           Code Status History     Date Active Date Inactive Code Status Order ID Comments User Context   10/24/2020 1730 10/29/2020 0026 DNR [id number]  Drema Dallas, MD ED   03/20/2020 1015 03/26/2020 1935 DNR [id number]  Haskel Khan,  NP Inpatient   03/11/2020 1821 03/20/2020 1014 Full Code [id number]  Eliezer Bottom, MD ED   06/26/2015 2305 06/29/2015 1548 DNR [id number]  Araceli Bouche, DO ED   06/11/2014 1125 06/11/2014 2028 Full Code [id number]  [place], Peter M, MD Inpatient   06/10/2014 2118 06/11/2014 1125 Full Code [id number]  Glori Luis, MD Inpatient      Advance Directive Documentation    Flowsheet Row Most Recent Value  Type of Advance Directive Out of facility DNR (pink MOST or yellow form)  Pre-existing out of facility DNR order (yellow form or pink MOST form) --  "MOST" Form in Place? --      Prognosis:  Guarded  Discharge Planning: Home with Palliative Services and Home Health  Care plan was discussed with patient, patient's brother Christine Benitez  Total time: 15 minutes Greater than 50% of this time was spent in counseling and coordinating care related to the above assessment and plan.  Richardson Dopp, PA-C Palliative Medicine Team Team phone # [phone]  Thank you for allowing the Palliative Medicine Team to assist in the care of this patient. Please utilize secure chat with additional questions, if there is no response within 30 minutes please call the above phone number.  Palliative Medicine Team providers are available by phone from 7am to 7pm daily and can be reached through the team cell phone.  Should this patient  require assistance outside of these hours, please call the patient's attending physician.

## 2020-11-17 NOTE — Progress Notes (Signed)
Nutrition Follow-up  DOCUMENTATION CODES:   Non-severe (moderate) malnutrition in context of chronic illness, Underweight  INTERVENTION:   Discontinue calorie count. Continue to encourage intake of meals and supplements. Continue Ensure Enlive po TID, each supplement provides 350 kcal and 20 grams of protein. Continue MVI with minerals daily. D/C magic cups, patient is not eating.  Add Vital Cuisine Shake BID with meals, each supplement provides 520 kcal and 22 grams of protein.  NUTRITION DIAGNOSIS:  Moderate Malnutrition related to chronic illness (CHF, CVA, DM) as evidenced by mild fat depletion, mild muscle depletion, moderate muscle depletion.  Ongoing  GOAL:   Patient will meet greater than or equal to 90% of their needs  Progressing   MONITOR:   PO intake, Supplement acceptance, Labs, Weight trends, Skin, I & O's  REASON FOR ASSESSMENT:   Consult Calorie Count  ASSESSMENT:   Christine Benitez is a 60 y.o. female with medical history significant of insulin-dependent diabetes, chronic systolic CHF, nonischemic cardiomyopathy, pulmonary hypertension, CVA, hypertension, CKD stage IIIa presented to the ED via EMS for hypoglycemia and unresponsiveness.  EMS reported the patient took her morning medications around 8 AM and when her brother went to check on her at 2 PM she was found unresponsive.  Upon EMS arrival her blood glucose was less than 15.  She was given 12.5 g of D10 and blood glucose improved to 90.  Patient then became awake and alert.  However, in route to the hospital she became unresponsive again and blood glucose was checked and was down to 40 and became bradycardic with heart rate 30.  She was given additional 12.5 g of D10 and blood glucose improved to 190.  Blood glucose again dropped to 60 at the time of arrival to the ED and down to 16 and subsequent CBG check.  Patient was very drowsy with poor attention.  Labs showing WBC 6.1, hemoglobin 9.7 (at baseline),  platelet count 243K.  Potassium 8.2 on i-STAT chemistry but sample was hemolyzed.  Labs repeated and BMP showing sodium 138, potassium 4.9, chloride 107, bicarb 24, BUN 23, creatinine 1.2 (at baseline), glucose 86. Mentation improved and blood glucose improved to 100 after she was given D50 and started on D10 infusion.  Bradycardia resolved.  Calorie Count:  7/8 Dinner = 350 kcal, 19 gm protein 7/9 Breakfast & Lunch plus 1 Ensure = 500 kcal, 28 gm protein 7/10 Breakfast, Lunch, & Dinner plus 1 snack and 2 Ensures = 950 kcal, 53 gm protein 7/11 Breakfast + 1 Ensure = 720 kcal, 38 gm protein  Average daily intake ~1080 kcal and 60 gm protein (including meals and supplements)  Average intake is meeting ~72% of kcal needs and 86% of protein needs.  Labs reviewed.  CBG: [phone]  Medications reviewed and include marinol, novolog, lantus, remeron, MVI with minerals, Lokelma.  Palliative care team is following. Goals of care are being discussed with patient and family. Outpatient palliative care consult has been ordered.   Diet Order:   Diet Order             Diet Carb Modified Fluid consistency: Thin; Room service appropriate? Yes with Assist  Diet effective now           Diet - low sodium heart healthy                   EDUCATION NEEDS:   Education needs have been addressed  Skin:  Skin Assessment: Reviewed RN Assessment  Last BM:  7/6  Height:   Ht Readings from Last 1 Encounters:  11/02/20 5\' 4"  (1.626 m)    Weight:   Wt Readings from Last 1 Encounters:  11/17/20 47.7 kg    Ideal Body Weight:  54.5 kg  BMI:  Body mass index is 18.05 kg/m.  Estimated Nutritional Needs:   Kcal:  1500-1700  Protein:  70-85 grams  Fluid:  > 1.5 L    01/18/21, RD, LDN, CNSC Please refer to Amion for contact information.

## 2020-11-17 NOTE — Progress Notes (Signed)
Mobility Specialist: Progress Note   11/17/20 1623  Mobility  Activity Ambulated in hall  Level of Assistance Standby assist, set-up cues, supervision of patient - no hands on  Assistive Device Front wheel walker  Distance Ambulated (ft) 160 ft  Mobility Ambulated with assistance in hallway  Mobility Response Tolerated well  Mobility performed by Mobility specialist  $Mobility charge 1 Mobility   Pre-Mobility: 81 HR Post-Mobility: 82 HR  Pt to BR and then agreeable to ambulate, BM successful. Pt asx throughout ambulation. Pt back to bed after walk with call bell at her side and bed alarm on.   [facility] Christine Benitez Mobility Specialist Mobility Specialist Phone: [phone]

## 2020-11-17 NOTE — Progress Notes (Signed)
[place] Rm [phone] Civil engineer, contracting [facility]) Hospital Liaison note:  This is a pending outpatient-based Palliative Care patient. Will continue to follow for disposition.  Please call with any outpatient palliative questions or concerns.  Thank you, Abran Cantor, LPN [facility] Liaison [phone]

## 2020-11-17 NOTE — TOC Progression Note (Signed)
Transition of Care (TOC) - Progression Note  Donn Pierini RN, BSN Transitions of Care Unit 4E- RN Case Manager See Treatment Team for direct phone #    Patient Details  Name: Christine Benitez MRN: [id number] Date of Birth: June 29, 1960  Transition of Care [facility]) CM/SW Contact  [name] [name], RN Phone Number: 11/17/2020, 3:44 PM  Clinical Narrative:    Call made and spoke with pt's mother Christine Benitez to discuss patient's return home today. Per MD patient is medically stable to transition home today. In discussion with Christine Benitez she states she just is not ready to bring patient home today as she feels like she can not take care of her in addition to the granddaughter and herself as it's just too much. She reports that she has a doctor's appointment herself this afternoon and would not be able to make plans to be at the patient's home in order for her to return home today. She states she needs pt to be able to take care of herself- however mother and brother was assisting pt at baseline.  Christine Benitez also states she needs to speak with her friend who as offered to assist her and see what plan can be put in place. Explained to [facility] again that we need to come up with a plan for patients return home as once MD declares that she is medically stable to discharge then the hospital can not "just keep her here". Christine Benitez states she will speak with her friend and ask that this CM call her again tomorrow to discuss discharge.   TOC to continue to follow.    Expected Discharge Plan: Home w Home Health Services Barriers to Discharge: Barriers Resolved  Expected Discharge Plan and Services Expected Discharge Plan: Home w Home Health Services   Discharge Planning Services: [facility] Choice: Home Health Living arrangements for the past 2 months: Single Family Home Expected Discharge Date: 11/06/20               DME Arranged: N/A DME Agency: NA       HH Arranged: RN, Disease  Management HH Agency: [facility] Date HH Agency Contacted: 11/04/20 Time HH Agency Contacted: 1230 Representative spoke with at [facility] Agency: Stacie   Social Determinants of Health (SDOH) Interventions    Readmission Risk Interventions Readmission Risk Prevention Plan 11/06/2020  Transportation Screening Complete  PCP or Specialist Appt within 5-7 Days Not Complete  Not Complete comments July 29  Home Care Screening Complete  Medication Review (RN CM) Complete  Some recent data might be hidden

## 2020-11-17 NOTE — Progress Notes (Signed)
PROGRESS NOTE    Christine Benitez   GDJ:[id number]  DOB: [birth year]  PCP: Lupita Dawn, MD    DOA: 11/02/2020 LOS: 14   Assessment & Plan   Principal Problem:   Hypoglycemia Active Problems:   Type 2 diabetes mellitus with complication, with long-term current use of insulin (HCC)   Chronic systolic CHF (congestive heart failure) (HCC)   Constipation   Cachexia (HCC)   Malnutrition of moderate degree   Hyperkalemia     Severe hypoglycemia in the setting of insulin-dependent type 2 diabetes -Likely due to inadequate oral intake -overall on hyperglycemic side now -Lantus 10 units BID, Novolog 8 units TID WC + SSI -continue adjust insulin -Continues to have poor oral intake, monitor closely   Hyponatremia - Na 126 on presentation.  Resolved with IV fluids. Follow BMP.   Hyperkalemia - Unclear etiology, adrenal insufficiency work up negative.   Low potassium diet Continue Lokelma Appears to be on Lokelma daily chronically. PCP follow up closely after d/c.   CKD stage IIIa - Cr near baseline   History of CVA with left-sided weakness , cognitive impairment  Continue aspirin/Plavix/Lipitor   Nonischemic cardiomyopathy -  Seen in [facility] Failure Clinic on October 16, 2020 -On ivabradine 2.5 mg BID for persistent tachycardia -Deemed poor candidate for SGLT2 inhibitor due to frequent UTIs -Not a good candidate for beta-blocker and hydralazine due to low blood pressure -Does not need diuretics regularly per cardiology notes,   Failure to thrive/malnutrition Patient was recently seen by palliative care during last hospitalization, at the time there is a discussion of hospice. Calorie count underway, she does not appear to eat much at all -Per mother patient's memory got worse after CVA in 03/2020 Patient not safe to live alone, requires 24 hr assistance.   If PO intake remains inadequate, may qualify for hospice Mother reports pt has had significant weight loss since after CVA in  03/2020 Palliative care consulted, appreciate assistance with GOC discussions.   Nutritional Assessment: The patient's BMI is: Body mass index is 18.05 kg/m.Marland Kitchen Seen by dietician.  I agree with the assessment and plan as outlined below: Nutrition Status: Nutrition Problem: Moderate Malnutrition Etiology: chronic illness (CHF, CVA, DM) Signs/Symptoms: mild fat depletion, mild muscle depletion, moderate muscle depletion Interventions: Ensure Enlive (each supplement provides 350kcal and 20 grams of protein), Magic cup, MVI   .  Patient BMI: Body mass index is 18.05 kg/m.   DVT prophylaxis:    Diet:  Diet Orders (From admission, onward)     Start     Ordered   11/12/20 0841  Diet Carb Modified Fluid consistency: Thin; Room service appropriate? Yes with Assist  Diet effective now       Comments: Pt is HARD OF HEARING. Will need someone to go to room. Thank you, Low potassium diet  Question Answer Comment  Diet-HS Snack? Nothing   Calorie Level Medium 1600-2000   Fluid consistency: Thin   Room service appropriate? Yes with Assist      11/12/20 0841   11/06/20 0000  Diet - low sodium heart healthy        11/06/20 1502              Code Status: DNR   Brief Narrative / Hospital Course to Date:   "60 year old female with brittle diabetes, cardiomyopathy, chronic kidney disease, admitted to the hospital with hypoglycemia and acute metabolic encephalopathy.  Recent admission for urinary tract infection which she was discharged on 6/21".  Subjective  11/17/20    Patient non-verbal and does not interact with me during encounter on rounds today.  The only question she answered was nodding yes when asked if she is going home with her mom.  No acute events reported.   Disposition Plan & Communication   Status is: Inpatient  Remains inpatient appropriate because: unsafe d/c until mother is able to be home for 24/7 caregiving patient will require.  Per TOC, pt's mother trying to  obtain additional help.  Dispo: The patient is from: Home              Anticipated d/c is to: Home              Patient currently is medically stable to d/c.   Difficult to place patient No     Consults, Procedures, Significant Events   Consultants:  Palliative care  Procedures:  None  Antimicrobials:  Anti-infectives (From admission, onward)    None         Micro    Objective   Vitals:   11/17/20 0450 11/17/20 0800 11/17/20 1149 11/17/20 1725  BP: 125/76 115/63 128/73 133/86  Pulse: 69 77 74 81  Resp: 18 18 13 18   Temp: 98.1 F (36.7 C) 98.2 F (36.8 C) 98.3 F (36.8 C) 98.7 F (37.1 C)  TempSrc: Oral Oral Oral Oral  SpO2: 100% 100% 100% 100%  Weight: 47.7 kg     Height:        Intake/Output Summary (Last 24 hours) at 11/17/2020 1859 Last data filed at 11/17/2020 0815 Gross per 24 hour  Intake 1287.91 ml  Output 925 ml  Net 362.91 ml   Filed Weights   11/02/20 2248 11/04/20 0351 11/17/20 0450  Weight: 42.6 kg 47.7 kg 47.7 kg    Physical Exam:  General exam: awake, alert, no acute distress, nonverbal HEENT: atraumatic, clear conjunctiva, hearing grossly normal  Respiratory system: CTAB, no wheezes, rales or rhonchi, normal respiratory effort. Cardiovascular system: normal S1/S2, RRR, no pedal edema.   Gastrointestinal system: soft, NT, ND, +bowel sounds. Central nervous system: no gross focal neurologic deficits, follows commands, non-verbal Skin: dry, intact, normal temperature Psychiatry: normal mood, flat affect  Labs   Data Reviewed: I have personally reviewed following labs and imaging studies  CBC: Recent Labs  Lab 11/11/20 0101 11/12/20 0056 11/17/20 0051  WBC 5.3 6.6 7.3  NEUTROABS 2.9 4.0  --   HGB 10.0* 10.3* 8.9*  HCT 30.2* 31.7* 27.2*  MCV 94.7 94.9 96.1  PLT 279 277 254   Basic Metabolic Panel: Recent Labs  Lab 11/13/20 0223 11/14/20 0639 11/15/20 0810 11/16/20 0214 11/17/20 0051  NA 130* 134* 138 135 135  K 4.6  4.4 3.9 4.4 4.0  CL 101 105 104 100 103  CO2 23 25 27 28 26   GLUCOSE 259* 144* 113* 372* 247*  BUN 35* 30* 28* 36* 29*  CREATININE 1.30* 1.21* 1.13* 1.32* 1.15*  CALCIUM 8.9 8.8* 9.4 9.0 8.8*  MG  --   --   --   --  1.9   GFR: Estimated Creatinine Clearance: 40.2 mL/min (A) (by C-G formula based on SCr of 1.15 mg/dL (H)). Liver Function Tests: Recent Labs  Lab 11/17/20 0051  AST 23  ALT 32  ALKPHOS 49  BILITOT 0.6  PROT 5.7*  ALBUMIN 2.8*   No results for input(s): LIPASE, AMYLASE in the last 168 hours. No results for input(s): AMMONIA in the last 168 hours. Coagulation Profile: No results for input(s):  INR, PROTIME in the last 168 hours. Cardiac Enzymes: No results for input(s): CKTOTAL, CKMB, CKMBINDEX, TROPONINI in the last 168 hours. BNP (last 3 results) No results for input(s): PROBNP in the last 8760 hours. HbA1C: No results for input(s): HGBA1C in the last 72 hours. CBG: Recent Labs  Lab 11/17/20 0017 11/17/20 0454 11/17/20 0810 11/17/20 1142 11/17/20 1722  GLUCAP 254* 234* 168* 247* 218*   Lipid Profile: No results for input(s): CHOL, HDL, LDLCALC, TRIG, CHOLHDL, LDLDIRECT in the last 72 hours. Thyroid Function Tests: No results for input(s): TSH, T4TOTAL, FREET4, T3FREE, THYROIDAB in the last 72 hours. Anemia Panel: No results for input(s): VITAMINB12, FOLATE, FERRITIN, TIBC, IRON, RETICCTPCT in the last 72 hours. Sepsis Labs: No results for input(s): PROCALCITON, LATICACIDVEN in the last 168 hours.  No results found for this or any previous visit (from the past 240 hour(s)).    Imaging Studies   No results found.   Medications   Scheduled Meds:  aspirin EC  81 mg Oral Daily   atorvastatin  40 mg Oral QPM   clopidogrel  75 mg Oral Daily   dronabinol  5 mg Oral BID AC   enoxaparin (LOVENOX) injection  20 mg Subcutaneous Q24H   feeding supplement  237 mL Oral TID BM   insulin aspart  0-9 Units Subcutaneous TID WC   insulin aspart  8 Units  Subcutaneous TID WC   insulin glargine  10 Units Subcutaneous BID   ivabradine  2.5 mg Oral BID WC   mirtazapine  15 mg Oral QHS   multivitamin with minerals  1 tablet Oral Daily   sodium zirconium cyclosilicate  10 g Oral BID   Continuous Infusions:     LOS: 14 days    Time spent: 30 minutes    Pennie Banter, DO [facility]  11/17/2020, 6:59 PM      If 7PM-7AM, please contact night-coverage. How to contact the [facility] Attending or Consulting provider 7A - 7P or covering provider during after hours 7P -7A, for this patient?    Check the care team in [facility] and look for a) attending/consulting TRH provider listed and b) the [facility] team listed Log into [email or web address] and use [place]'s universal password to access. If you do not have the password, please contact the hospital operator. Locate the [facility] provider you are looking for under [facility] and page to a number that you can be directly reached. If you still have difficulty reaching the provider, please page the [facility] (Director on Call) for the Hospitalists listed on amion for assistance.

## 2020-11-18 LAB — MAGNESIUM: Magnesium: 1.9 mg/dL (ref 1.7–2.4)

## 2020-11-18 LAB — BASIC METABOLIC PANEL
Anion gap: 5 (ref 5–15)
BUN: 27 mg/dL — ABNORMAL HIGH (ref 6–20)
CO2: 26 mmol/L (ref 22–32)
Calcium: 9 mg/dL (ref 8.9–10.3)
Chloride: 106 mmol/L (ref 98–111)
Creatinine, Ser: 1.17 mg/dL — ABNORMAL HIGH (ref 0.44–1.00)
GFR, Estimated: 54 mL/min — ABNORMAL LOW (ref >60)
Glucose, Bld: 263 mg/dL — ABNORMAL HIGH (ref 70–99)
Potassium: 4.6 mmol/L (ref 3.5–5.1)
Sodium: 137 mmol/L (ref 135–145)

## 2020-11-18 LAB — CBC
HCT: 27.3 % — ABNORMAL LOW (ref 36.0–46.0)
Hemoglobin: 8.7 g/dL — ABNORMAL LOW (ref 12.0–15.0)
MCH: 30.9 pg (ref 26.0–34.0)
MCHC: 31.9 g/dL (ref 30.0–36.0)
MCV: 96.8 fL (ref 80.0–100.0)
Platelets: 250 10*3/uL (ref 150–400)
RBC: 2.82 MIL/uL — ABNORMAL LOW (ref 3.87–5.11)
RDW: 13.2 % (ref 11.5–15.5)
WBC: 6.7 10*3/uL (ref 4.0–10.5)
nRBC: 0 % (ref 0.0–0.2)

## 2020-11-18 LAB — GLUCOSE, CAPILLARY
Glucose-Capillary: 143 mg/dL — ABNORMAL HIGH (ref 70–99)
Glucose-Capillary: 206 mg/dL — ABNORMAL HIGH (ref 70–99)
Glucose-Capillary: 224 mg/dL — ABNORMAL HIGH (ref 70–99)
Glucose-Capillary: 245 mg/dL — ABNORMAL HIGH (ref 70–99)

## 2020-11-18 MED ORDER — ENSURE ENLIVE PO LIQD: 237.0000 mL | Freq: Three times a day (TID) | ORAL | 12 refills | Status: DC

## 2020-11-18 MED ORDER — INSULIN GLARGINE 100 UNIT/ML SOLOSTAR PEN: 10.0000 [IU] | PEN_INJECTOR | Freq: Two times a day (BID) | SUBCUTANEOUS | 3 refills | Status: DC

## 2020-11-18 MED ORDER — SODIUM ZIRCONIUM CYCLOSILICATE 10 G PO PACK: 10.0000 g | PACK | Freq: Two times a day (BID) | ORAL | 0 refills | Status: DC

## 2020-11-18 MED ORDER — NOVOLOG FLEXPEN 100 UNIT/ML ~~LOC~~ SOPN: 8.0000 [IU] | PEN_INJECTOR | Freq: Three times a day (TID) | SUBCUTANEOUS | 0 refills | Status: DC

## 2020-11-18 NOTE — Plan of Care (Signed)
DISCHARGE NOTE HOME NIKKY DUBA to be discharged home per MD order. Discussed prescriptions and follow up appointments with the patient. Medication list explained in detail. Patient verbalized understanding.  Skin clean, dry and intact without evidence of skin break down, no evidence of skin tears noted. IV catheter discontinued intact. Site without signs and symptoms of complications. Dressing and pressure applied. Pt denies pain at the site currently. No complaints noted.  Patient free of lines, drains, and wounds.   An After Visit Summary (AVS) was printed and given to the patient. Patient escorted via wheelchair, and discharged home via taxi.  Pt's mother notified that patient was on her way home.  Arlice Colt, RN

## 2020-11-18 NOTE — Progress Notes (Signed)
Occupational Therapy Treatment Patient Details Name: Christine Benitez MRN: [id number] DOB: 11-22-1960 Today's Date: 11/18/2020    History of present illness Pt is a 60 y/o female admitted 6/26 secondary to hypoglycemia and unresponsiveness with encephalopathy. PMH includes DM, CHF, CKD, cardiomyopathy, HTN, CVA,   OT comments  Patient participates well.  Requires supervision for LB dressing and toilet transfers, functional mobility in room using RW.  Cueing for problem solving, and total assist for toileting needs due to urgency and incontinence as entering bathroom.  HH remains appropriate.    Follow Up Recommendations  Home health OT;Supervision/Assistance - 24 hour    Equipment Recommendations  None recommended by OT    Recommendations for Other Services      Precautions / Restrictions Precautions Precautions: Fall Precaution Comments: HOH, decreased vision Restrictions Weight Bearing Restrictions: No       Mobility Bed Mobility Overal bed mobility: Modified Independent                  Transfers Overall transfer level: Needs assistance Equipment used: Rolling walker (2 wheeled) Transfers: Sit to/from Stand Sit to Stand: Supervision         General transfer comment: supervision for safety    Balance Overall balance assessment: Needs assistance Sitting-balance support: No upper extremity supported;Feet supported Sitting balance-Leahy Scale: Good     Standing balance support: Bilateral upper extremity supported;During functional activity;No upper extremity supported Standing balance-Leahy Scale: Poor Standing balance comment: able to engage in ADLs without UE support but relies on BUE support dynamcially                           ADL either performed or assessed with clinical judgement   ADL Overall ADL's : Needs assistance/impaired     Grooming: Wash/dry hands;Supervision/safety;Standing               Lower Body Dressing:  Supervision/safety;Set up;Sit to/from stand Lower Body Dressing Details (indicate cue type and reason): donned socks EOB Toilet Transfer: Supervision/safety;Ambulation;RW Toilet Transfer Details (indicate cue type and reason): for safety Toileting- Clothing Manipulation and Hygiene: Total assistance;Sit to/from stand Toileting - Clothing Manipulation Details (indicate cue type and reason): able to manage mesh underwear down, but incontinent of bladder at bathroom door and requires total assist for cleanup     Functional mobility during ADLs: Supervision/safety;Rolling walker;Cueing for safety General ADL Comments: pt limited by cognition, weakness     Vision       Perception     Praxis      Cognition Arousal/Alertness: Awake/alert Behavior During Therapy: Flat affect Overall Cognitive Status: History of cognitive impairments - at baseline                                          Exercises     Shoulder Instructions       General Comments VSS on RA    Pertinent Vitals/ Pain       Pain Assessment: No/denies pain  Home Living                                          Prior Functioning/Environment              Frequency  Min 2X/week  Progress Toward Goals  OT Goals(current goals can now be found in the care plan section)  Progress towards OT goals: Progressing toward goals  Acute Rehab OT Goals Patient Stated Goal: to go home OT Goal Formulation: With patient  Plan Discharge plan remains appropriate;Frequency remains appropriate    Co-evaluation                 AM-PAC OT "6 Clicks" Daily Activity     Outcome Measure   Help from another person eating meals?: A Little Help from another person taking care of personal grooming?: A Little Help from another person toileting, which includes using toliet, bedpan, or urinal?: A Little Help from another person bathing (including washing, rinsing, drying)?: A  Little Help from another person to put on and taking off regular upper body clothing?: A Little Help from another person to put on and taking off regular lower body clothing?: A Little 6 Click Score: 18    End of Session Equipment Utilized During Treatment: Rolling walker  OT Visit Diagnosis: Muscle weakness (generalized) (M62.81)   Activity Tolerance Patient tolerated treatment well   Patient Left in bed;with call bell/phone within reach;with bed alarm set   Nurse Communication Mobility status        Time: 4944-9675 OT Time Calculation (min): 23 min  Charges: OT General Charges $OT Visit: 1 Visit OT Treatments $Self Care/Home Management : 23-37 mins  Barry Brunner, OT Acute Rehabilitation Services Pager [phone] Office [phone]    Christine Benitez 11/18/2020, 1:56 PM

## 2020-11-18 NOTE — Discharge Summary (Signed)
Physician Discharge Summary  Christine Benitez PZW:[id number] DOB: 1960/07/25 DOA: 11/02/2020  PCP: Claiborne Billings, MD  Admit date: 11/02/2020 Discharge date: 12/10/2020  Admitted From: home Disposition:  home  Recommendations for Outpatient Follow-up:  Follow up with PCP in 1-2 weeks Please obtain BMP/CBC in one week Please follow up on patient's blood glucose readings and insulin regimen.    Home Health: Yes  Equipment/Devices: none   Discharge Condition: stable  CODE STATUS:  DNR Diet recommendation: Carb Modified   Discharge Diagnoses: Principal Problem:   Hypoglycemia Active Problems:   Type 2 diabetes mellitus with complication, with long-term current use of insulin (HCC)   Chronic systolic CHF (congestive heart failure) (HCC)   Constipation   Cachexia (HCC)   Malnutrition of moderate degree   Hyperkalemia    Summary of HPI and Hospital Course:  "60 year old female with brittle diabetes, cardiomyopathy, chronic kidney disease, admitted to the hospital with hypoglycemia and acute metabolic encephalopathy.  Recent admission for urinary tract infection which she was discharged on 6/21".  Severe hypoglycemia in the setting of insulin-dependent type 2 diabetes -Likely due to inadequate oral intake -overall on hyperglycemic side now -Lantus 10 units BID, Novolog 8 units TID WC + SSI -continue adjust insulin -Continues to have poor oral intake, monitor closely   Hyponatremia - Na 126 on presentation.  Resolved with IV fluids. Follow BMP.   Hyperkalemia - Unclear etiology, adrenal insufficiency work up negative.   Low potassium diet Continue Lokelma Appears to be on Lokelma daily chronically. PCP follow up closely after d/c.   CKD stage IIIa - Cr near baseline   History of CVA with left-sided weakness , cognitive impairment  Continue aspirin/Plavix/Lipitor   Nonischemic cardiomyopathy - Seen in [facility] on October 16, 2020 -On ivabradine 2.5 mg BID for  persistent tachycardia -Deemed poor candidate for SGLT2 inhibitor due to frequent UTIs -Not a good candidate for beta-blocker and hydralazine due to low blood pressure -Does not need diuretics regularly per cardiology notes,   Failure to thrive/malnutrition Patient was recently seen by palliative care during last hospitalization, at the time there is a discussion of hospice. Calorie count underway, she does not appear to eat much at all -Per mother patient's memory got worse after CVA in 03/2020 Patient not safe to live alone, requires 24 hr assistance.   If PO intake remains inadequate, may qualify for hospice Mother reports pt has had significant weight loss since after CVA in 03/2020 Palliative care consulted, appreciate assistance with [place] discussions.   Nutritional Assessment: The patient's BMI is: Body mass index is 18.05 kg/m.Marland Kitchen Seen by dietician.  I agree with the assessment and plan as outlined below: Nutrition Status: Nutrition Problem: Moderate Malnutrition Etiology: chronic illness (CHF, CVA, DM) Signs/Symptoms: mild fat depletion, mild muscle depletion, moderate muscle depletion Interventions: Ensure Enlive (each supplement provides 350kcal and 20 grams of protein), Magic cup, MVI   Discharge Instructions    ADD TO MEDICATIONS BELOW -  Lantus 10 units BID, Novolog 8 units TID WC + SSI  Discharge Instructions     Call MD for:   Complete by: As directed    Uncontrolled blood sugars - with high or low readings.   Call MD for:  extreme fatigue   Complete by: As directed    Call MD for:  persistant dizziness or light-headedness   Complete by: As directed    Call MD for:  persistant nausea and vomiting   Complete by: As directed  Call MD for:  severe uncontrolled pain   Complete by: As directed    Call MD for:  temperature >100.4   Complete by: As directed    Diet - low sodium heart healthy   Complete by: As directed    Discharge instructions   Complete by: As  directed    Please monitor blood sugars closely at home. Write down all your blood sugar readings to bring with you to follow up doctor's appointments so they can adjust your insulin dosing if needed.  If you are not eating well, be EXTRA CAREFUL - insulin will drop your sugar dangerously low if you are not eating.   Increase activity slowly   Complete by: As directed    Increase activity slowly   Complete by: As directed       Allergies as of 11/18/2020   No Known Allergies      Medication List     STOP taking these medications    Lantus SoloStar 100 UNIT/ML Solostar Pen Generic drug: insulin glargine   NovoLOG FlexPen 100 UNIT/ML FlexPen Generic drug: insulin aspart       TAKE these medications    albuterol 108 (90 Base) MCG/ACT inhaler Commonly known as: VENTOLIN HFA Inhale 2 puffs into the lungs every 6 (six) hours as needed for wheezing or shortness of breath.   aspirin 81 MG EC tablet TAKE 1 TABLET (81 MG TOTAL) BY MOUTH DAILY.   atorvastatin 40 MG tablet Commonly known as: LIPITOR Take 1 tablet (40 mg total) by mouth every evening.   clopidogrel 75 MG tablet Commonly known as: Plavix Take 1 tablet (75 mg total) by mouth daily.   dronabinol 5 MG capsule Commonly known as: MARINOL Take 1 capsule (5 mg total) by mouth 2 (two) times daily before lunch and supper.   feeding supplement Liqd Take 237 mLs by mouth 2 (two) times daily between meals.   feeding supplement Liqd Take 237 mLs by mouth 3 (three) times daily between meals.   ivabradine 5 MG Tabs tablet Commonly known as: Corlanor Take 0.5 tablets (2.5 mg total) by mouth 2 (two) times daily with a meal.   mirtazapine 15 MG disintegrating tablet Commonly known as: REMERON SOL-TAB Take 0.5 tablets (7.5 mg total) by mouth at bedtime. What changed: how much to take   multivitamin with minerals Tabs tablet Take 1 tablet by mouth daily.        Follow-up Information     [facility] [place] Follow up.   Specialty: [place] Why: HHRN arranged- they will contact you to schedule initial visit Contact information: [address] [place] [address] [phone]         Claiborne Billings, MD. Schedule an appointment as soon as possible for a visit in 1 week(s).   Specialty: Family Medicine Contact information: [place] [address] [phone]         Berniece Salines, DO .   Specialty: Cardiology Contact information: [place] [address] [phone]         [facility] Follow up.   Why: outpt palliative to follow up Contact information: [place]               No Known Allergies   If you experience worsening of your admission symptoms, develop shortness of breath, life threatening emergency, suicidal or homicidal thoughts you must seek medical attention immediately by  calling 911 or calling your MD immediately  if symptoms less severe.    Please note   You were cared for by a hospitalist during your hospital stay. If you have any questions about your discharge medications or the care you received while you were in the hospital after you are discharged, you can call the unit and asked to speak with the hospitalist on call if the hospitalist that took care of you is not available. Once you are discharged, your primary care physician will handle any further medical issues. Please note that NO REFILLS for any discharge medications will be authorized once you are discharged, as it is imperative that you return to your primary care physician (or establish a relationship with a primary care physician if you do not have one) for your aftercare needs so that they can reassess your need for medications and monitor your lab values.   Consultations: Palliative Care   Procedures/Studies: CT HEAD WO CONTRAST  Result Date:  11/25/2020 CLINICAL DATA:  Delirium EXAM: CT HEAD WITHOUT CONTRAST TECHNIQUE: Contiguous axial images were obtained from the base of the skull through the vertex without intravenous contrast. COMPARISON:  10/24/2020 FINDINGS: Brain: There is no mass, hemorrhage or extra-axial collection. There is generalized atrophy without lobar predilection. Hypodensity of the white matter is most commonly associated with chronic microvascular disease. Vascular: No abnormal hyperdensity of the major intracranial arteries or dural venous sinuses. No intracranial atherosclerosis. Skull: The visualized skull base, calvarium and extracranial soft tissues are normal. Sinuses/Orbits: No fluid levels or advanced mucosal thickening of the visualized paranasal sinuses. No mastoid or middle ear effusion. The orbits are normal. IMPRESSION: Generalized atrophy and chronic microvascular ischemia without acute intracranial abnormality. Electronically Signed   By: Ulyses Jarred M.D.   On: 11/25/2020 03:53   DG Chest Port 1 View  Result Date: 11/24/2020 CLINICAL DATA:  Sepsis EXAM: PORTABLE CHEST 1 VIEW COMPARISON:  10/24/2020 FINDINGS: There is increased opacity within the right lung. No pleural effusion or pneumothorax. Normal cardiomediastinal contours. IMPRESSION: Right lung opacities, likely pneumonia. Electronically Signed   By: Ulyses Jarred M.D.   On: 11/24/2020 21:40   DG Swallowing Func-Speech Pathology  Result Date: 11/26/2020 Formatting of this result is different from the original. Objective Swallowing Evaluation: Type of Study: MBS-Modified Barium Swallow Study  Patient Details Name: Christine Benitez MRN: [id number] Date of Birth: 1960-07-27 Today's Date: 11/26/2020 Time: SLP Start Time (ACUTE ONLY): 1215 -SLP Stop Time (ACUTE ONLY): 1227 SLP Time Calculation (min) (ACUTE ONLY): 12 min Past Medical History: Past Medical History: Diagnosis Date  Abnormal LFTs   a. 05/2014 -  ALT 52, alk phos 130.  Chronic systolic CHF (congestive  heart failure) ([place])   a. Dx 05/2014 - echo at [facility] - EF 30-35%, moderate LVH, no rWMA, mild to moderate MR, moderate PH with PA pressure 60-51mmHg, mild to moderate pericardial effusion. EF 30-35% by cath.  History of blood transfusion 1986  "related to C-section"  Hypertension   Insulin dependent diabetes mellitus   Iron deficiency anemia   NICM (nonischemic cardiomyopathy) ([place])   a. [place] 06/11/14:  minor nonobstructive CAD with mild irregularities in the LAD less than 10-20%, LCx with mild disease less than 20%, no significant disease in the RCA, EF 30-35%.  Pericardial effusion   a. Mild-moderate pericardial effusion by echo at [facility].  Pulmonary hypertension ([place])   a. Moderate PH by echo at [facility].  Tobacco abuse  Past Surgical History: Past Surgical History: Procedure Laterality Date  APPENDECTOMY  ~ 2003  CESAREAN SECTION  1986  LEFT HEART CATHETERIZATION WITH CORONARY ANGIOGRAM N/A 06/11/2014  Procedure: LEFT HEART CATHETERIZATION WITH CORONARY ANGIOGRAM;  Surgeon: Peter M Martinique, MD;  Location: [facility] CATH LAB;  Service: Cardiovascular;  Laterality: N/A; HPI: Pt is a 60 y.o. female who presented to the ED via EMS due to unresponsiveness and severe hypoglycemia. CT chest: Multifocal right lung pneumonia. PMH: diabetes, CVA, nonischemic cardiomyopathy, pulmonary hypertension, hypertension. Pt was alst seen by SLP in [place], 2021 at which time she was on a dysphgia 2 diet and thin liquids.  No data recorded Assessment / Plan / Recommendation CHL IP CLINICAL IMPRESSIONS 11/26/2020 Clinical Impression Pt was seen in radiology suite for modified barium swallow study. Trials of puree solids, regular texture solids, a 68mm barium tablet, and thin liquids via cup and straw were administered. Pt's oropharyngeal swallow mechanism was within functional limits. It is recommended that a regular texture diet with thin liquids be continued at this time to maximize pt's options. Further skilled SLP services are not  clinically indicated at this time. SLP Visit Diagnosis Dysphagia, unspecified (R13.10) Attention and concentration deficit following -- Frontal lobe and executive function deficit following -- Impact on safety and function Mild aspiration risk   CHL IP TREATMENT RECOMMENDATION 11/26/2020 Treatment Recommendations Therapy as outlined in treatment plan below   Prognosis 11/26/2020 Prognosis for Safe Diet Advancement Good Barriers to Reach Goals -- Barriers/Prognosis Comment -- CHL IP DIET RECOMMENDATION 11/26/2020 SLP Diet Recommendations Regular solids;Thin liquid Liquid Administration via Cup;Straw Medication Administration Whole meds with liquid Compensations Slow rate;Small sips/bites Postural Changes --   CHL IP OTHER RECOMMENDATIONS 11/26/2020 Recommended Consults -- Oral Care Recommendations Oral care BID Other Recommendations --   CHL IP FOLLOW UP RECOMMENDATIONS 11/26/2020 Follow up Recommendations None   CHL IP FREQUENCY AND DURATION 11/26/2020 Speech Therapy Frequency (ACUTE ONLY) min 1 x/week Treatment Duration 1 week      CHL IP ORAL PHASE 11/26/2020 Oral Phase WFL Oral - Pudding Teaspoon -- Oral - Pudding Cup -- Oral - Honey Teaspoon -- Oral - Honey Cup -- Oral - Nectar Teaspoon -- Oral - Nectar Cup -- Oral - Nectar Straw -- Oral - Thin Teaspoon -- Oral - Thin Cup -- Oral - Thin Straw -- Oral - Puree -- Oral - Mech Soft -- Oral - Regular -- Oral - Multi-Consistency -- Oral - Pill -- Oral Phase - Comment --  CHL IP PHARYNGEAL PHASE 11/26/2020 Pharyngeal Phase WFL Pharyngeal- Pudding Teaspoon -- Pharyngeal -- Pharyngeal- Pudding Cup -- Pharyngeal -- Pharyngeal- Honey Teaspoon -- Pharyngeal -- Pharyngeal- Honey Cup -- Pharyngeal -- Pharyngeal- Nectar Teaspoon -- Pharyngeal -- Pharyngeal- Nectar Cup -- Pharyngeal -- Pharyngeal- Nectar Straw -- Pharyngeal -- Pharyngeal- Thin Teaspoon -- Pharyngeal -- Pharyngeal- Thin Cup Penetration/Aspiration during swallow Pharyngeal Material enters airway, remains ABOVE vocal  cords then ejected out Pharyngeal- Thin Straw Penetration/Aspiration during swallow Pharyngeal Material enters airway, remains ABOVE vocal cords then ejected out Pharyngeal- Puree -- Pharyngeal -- Pharyngeal- Mechanical Soft -- Pharyngeal -- Pharyngeal- Regular -- Pharyngeal -- Pharyngeal- Multi-consistency -- Pharyngeal -- Pharyngeal- Pill Penetration/Aspiration during swallow Pharyngeal Material enters airway, remains ABOVE vocal cords then ejected out Pharyngeal Comment --  Shanika I. Hardin Negus, [place], [place] Office number [phone] Pager [phone] Horton Marshall 11/26/2020, 1:24 PM              CT CHEST ABDOMEN PELVIS WO CONTRAST  Result Date: 11/25/2020 CLINICAL DATA:  Respiratory failure EXAM: CT CHEST, ABDOMEN  AND PELVIS WITHOUT CONTRAST TECHNIQUE: Multidetector CT imaging of the chest, abdomen and pelvis was performed following the standard protocol without IV contrast. COMPARISON:  Chest radiograph dated 11/24/2020. CTA chest dated 06/09/2014. FINDINGS: CT CHEST FINDINGS Cardiovascular: Heart is normal in size.  No pericardial effusion. No evidence of thoracic aortic aneurysm. Mild atherosclerotic calcifications of the descending thoracic aorta. Three vessel coronary atherosclerosis. Mediastinum/Nodes: No suspicious mediastinal lymphadenopathy. Visualized thyroid is unremarkable. Lungs/Pleura: Multifocal patchy opacities predominantly in the posterior right upper lobe and posterior right lower lobe, with additional tree-in-bud nodularity throughout the right lung, compatible with multifocal pneumonia. Mild patchy opacity in the posterior left lower lobe, possibly atelectasis. Mild centrilobular and paraseptal emphysematous changes, upper lung predominant. No pleural effusion or pneumothorax. Musculoskeletal: Visualized osseous structures are within normal limits. CT ABDOMEN PELVIS FINDINGS Hepatobiliary: Unenhanced liver is unremarkable. Gallbladder is unremarkable.  No intrahepatic or extrahepatic ductal dilatation. Pancreas: Within normal limits. Spleen: Within normal limits. Adrenals/Urinary Tract: Adrenal glands are within normal limits. Left renal atrophy. Right kidney is within normal limits. No renal calculi or hydronephrosis. Bladder is within normal limits. Stomach/Bowel: Stomach is within normal limits. No evidence of bowel obstruction. Appendix is not discretely visualized. Vascular/Lymphatic: No evidence of abdominal aortic aneurysm. Atherosclerotic calcifications of the abdominal aorta and branch vessels. No suspicious abdominopelvic lymphadenopathy. Reproductive: Uterus is within normal limits. No adnexal masses. Other: No abdominopelvic ascites. Musculoskeletal: Visualized osseous structures are within normal limits. IMPRESSION: Multifocal right lung pneumonia. Mild left renal atrophy. No acute findings in the abdomen/pelvis. Electronically Signed   By: Julian Hy M.D.   On: 11/25/2020 03:54       Subjective: Pt seen this AM. Reports she feels well.  Eating better.  No acute complaints.    Discharge Exam: Vitals:   11/18/20 0815 11/18/20 1135  BP: (!) 145/83 (!) 149/87  Pulse: 97 94  Resp: 18 20  Temp: 98.5 F (36.9 C) 97.7 F (36.5 C)  SpO2: 99% 100%   Vitals:   11/18/20 0457 11/18/20 0500 11/18/20 0815 11/18/20 1135  BP: 131/76  (!) 145/83 (!) 149/87  Pulse: 81  97 94  Resp: $Remo'16  18 20  'nHhal$ Temp: 98.2 F (36.8 C)  98.5 F (36.9 C) 97.7 F (36.5 C)  TempSrc: Oral  Oral Oral  SpO2: 100%  99% 100%  Weight:  49.1 kg    Height:        General: Pt is alert, awake, not in acute distress Cardiovascular: RRR, S1/S2 +, no rubs, no gallops Respiratory: CTA bilaterally, no wheezing, no rhonchi Abdominal: Soft, NT, ND, bowel sounds + Extremities: no edema, no cyanosis    The results of significant diagnostics from this hospitalization (including imaging, microbiology, ancillary and laboratory) are listed below for reference.      Microbiology: No results found for this or any previous visit (from the past 240 hour(s)).   Labs: BNP (last 3 results) No results for input(s): BNP in the last 8760 hours. Basic Metabolic Panel: No results for input(s): NA, K, CL, CO2, GLUCOSE, BUN, CREATININE, CALCIUM, MG, PHOS in the last 168 hours.  Liver Function Tests: No results for input(s): AST, ALT, ALKPHOS, BILITOT, PROT, ALBUMIN in the last 168 hours.  No results for input(s): LIPASE, AMYLASE in the last 168 hours. No results for input(s): AMMONIA in the last 168 hours. CBC: No results for input(s): WBC, NEUTROABS, HGB, HCT, MCV, PLT in the last 168 hours.  Cardiac Enzymes: No results for input(s): CKTOTAL, CKMB, CKMBINDEX, TROPONINI in the  last 168 hours. BNP: Invalid input(s): POCBNP CBG: No results for input(s): GLUCAP in the last 168 hours.  D-Dimer No results for input(s): DDIMER in the last 72 hours. Hgb A1c No results for input(s): HGBA1C in the last 72 hours. Lipid Profile No results for input(s): CHOL, HDL, LDLCALC, TRIG, CHOLHDL, LDLDIRECT in the last 72 hours. Thyroid function studies No results for input(s): TSH, T4TOTAL, T3FREE, THYROIDAB in the last 72 hours.  Invalid input(s): FREET3 Anemia work up No results for input(s): VITAMINB12, FOLATE, FERRITIN, TIBC, IRON, RETICCTPCT in the last 72 hours. Urinalysis    Component Value Date/Time   COLORURINE YELLOW 11/25/2020 0033   APPEARANCEUR CLOUDY (A) 11/25/2020 0033   LABSPEC 1.011 11/25/2020 0033   PHURINE 8.0 11/25/2020 0033   GLUCOSEU 150 (A) 11/25/2020 0033   HGBUR NEGATIVE 11/25/2020 0033   BILIRUBINUR NEGATIVE 11/25/2020 0033   KETONESUR NEGATIVE 11/25/2020 0033   PROTEINUR NEGATIVE 11/25/2020 0033   NITRITE POSITIVE (A) 11/25/2020 0033   LEUKOCYTESUR NEGATIVE 11/25/2020 0033   Sepsis Labs Invalid input(s): PROCALCITONIN,  WBC,  LACTICIDVEN Microbiology No results found for this or any previous visit (from the past 240  hour(s)).   Time coordinating discharge: Over 30 minutes  SIGNED:   Ezekiel Slocumb, DO [facility] 12/10/2020, 7:48 PM   If 7PM-7AM, please contact night-coverage [email or web address]

## 2020-11-18 NOTE — TOC Transition Note (Signed)
Transition of Care (TOC) - CM/SW Discharge Note Donn Pierini RN, BSN Transitions of Care Unit 4E- RN Case Manager See Treatment Team for direct phone #    Patient Details  Name: Christine Benitez MRN: [id number] Date of Birth: 1960/12/30  Transition of Care [facility]) CM/SW Contact:  Darrold Span, RN Phone Number: 11/18/2020, 12:40 PM   Clinical Narrative:    Pt stable for transition home today per MD, call made to mother [name] to see if she has made plans to be able to support pt returning home.  Per conversation with mom over the phone, she states she feels she is able to support patient returning home today and can be at the home after 3pm today for patient's arrival. Mom is still requesting transportation assistance for pt to return home. Will assist pt home via taxi. (Taxi voucher to be given to bedside RN) Will plan to call for taxi at 3pm this afternoon to transport home in order for pt to arrive after 3pm when mom will be able to be at the home.  Address confirmed with mom- [address], [place]  Discussed with mom applying for [employer]- which mom states she has in progress and is in touch with an attorney. Number provided to mom for medical records for her to f/u with records that the attorney has requested.  Also discussed HH services and referral made to [facility] that will f/u with pt post discharge- mom agreeable to them calling her if unable to reach pt to schedule appointments due to pt's hearing difficulties. Per mom pt had hearing aids at one time but they were lost at some point.  [facility] also to f/u with pt post discharge for possible outpt PC needs if patient agreeable.   Notified Stacie with [facility] of pt's transition home today for [facility] start of care and discussed adding HHPT/OT is able to the charity referral of [facility] that was approved earlier. Stacie to f/u.     Final next level of care: Home w Home Health Services Barriers to Discharge:  Barriers Resolved   Patient Goals and CMS Choice Patient states their goals for this hospitalization and ongoing recovery are:: return home   Choice offered to / list presented to : NA (charity referral- pt un-insured)  Discharge Placement               Home with [facility]        Discharge Plan and Services   Discharge Planning Services: CM Consult Post Acute Care Choice: Home Health          DME Arranged: N/A DME Agency: NA       HH Arranged: RN, Disease Management HH Agency: [facility] Date HH Agency Contacted: 11/18/20 Time HH Agency Contacted: 1230 Representative spoke with at [facility] Agency: Stacie  Social Determinants of Health (SDOH) Interventions     Readmission Risk Interventions Readmission Risk Prevention Plan 11/18/2020 11/06/2020  Transportation Screening Complete Complete  PCP or Specialist Appt within 5-7 Days - Not Complete  Not Complete comments - July 29  PCP or Specialist Appt within 3-5 Days Complete -  Home Care Screening - Complete  Medication Review (RN CM) - Complete  HRI or Home Care Consult Complete -  Social Work Consult for Recovery Care Planning/Counseling Complete -  Palliative Care Screening Complete -  Medication Review [employer]) Complete -  Some recent data might be hidden

## 2020-11-20 ENCOUNTER — Other Ambulatory Visit (HOSPITAL_COMMUNITY): Payer: Self-pay

## 2020-11-24 ENCOUNTER — Other Ambulatory Visit: Payer: Self-pay

## 2020-11-24 ENCOUNTER — Inpatient Hospital Stay (HOSPITAL_COMMUNITY)
Admission: EM | Admit: 2020-11-24 | Discharge: 2020-12-03 | DRG: 871 | Disposition: A | Discharge status: Home Health | Payer: Medicaid Other | Attending: Internal Medicine | Admitting: Internal Medicine

## 2020-11-24 ENCOUNTER — Emergency Department (HOSPITAL_COMMUNITY): Payer: Medicaid Other

## 2020-11-24 DIAGNOSIS — E875 Hyperkalemia: Secondary | ICD-10-CM | POA: Diagnosis present

## 2020-11-24 DIAGNOSIS — J189 Pneumonia, unspecified organism: Secondary | ICD-10-CM | POA: Diagnosis present

## 2020-11-24 DIAGNOSIS — Z801 Family history of malignant neoplasm of trachea, bronchus and lung: Secondary | ICD-10-CM

## 2020-11-24 DIAGNOSIS — Z781 Physical restraint status: Secondary | ICD-10-CM

## 2020-11-24 DIAGNOSIS — E162 Hypoglycemia, unspecified: Secondary | ICD-10-CM

## 2020-11-24 DIAGNOSIS — Z7982 Long term (current) use of aspirin: Secondary | ICD-10-CM

## 2020-11-24 DIAGNOSIS — E1165 Type 2 diabetes mellitus with hyperglycemia: Secondary | ICD-10-CM | POA: Diagnosis not present

## 2020-11-24 DIAGNOSIS — R6520 Severe sepsis without septic shock: Secondary | ICD-10-CM | POA: Diagnosis present

## 2020-11-24 DIAGNOSIS — Z681 Body mass index (BMI) 19 or less, adult: Secondary | ICD-10-CM

## 2020-11-24 DIAGNOSIS — Z87891 Personal history of nicotine dependence: Secondary | ICD-10-CM

## 2020-11-24 DIAGNOSIS — J9690 Respiratory failure, unspecified, unspecified whether with hypoxia or hypercapnia: Secondary | ICD-10-CM

## 2020-11-24 DIAGNOSIS — I272 Pulmonary hypertension, unspecified: Secondary | ICD-10-CM | POA: Diagnosis present

## 2020-11-24 DIAGNOSIS — G9340 Encephalopathy, unspecified: Secondary | ICD-10-CM

## 2020-11-24 DIAGNOSIS — Z8673 Personal history of transient ischemic attack (TIA), and cerebral infarction without residual deficits: Secondary | ICD-10-CM

## 2020-11-24 DIAGNOSIS — E785 Hyperlipidemia, unspecified: Secondary | ICD-10-CM | POA: Diagnosis present

## 2020-11-24 DIAGNOSIS — E872 Acidosis, unspecified: Secondary | ICD-10-CM

## 2020-11-24 DIAGNOSIS — Z66 Do not resuscitate: Secondary | ICD-10-CM | POA: Diagnosis present

## 2020-11-24 DIAGNOSIS — R64 Cachexia: Secondary | ICD-10-CM | POA: Diagnosis present

## 2020-11-24 DIAGNOSIS — Z79899 Other long term (current) drug therapy: Secondary | ICD-10-CM

## 2020-11-24 DIAGNOSIS — I130 Hypertensive heart and chronic kidney disease with heart failure and stage 1 through stage 4 chronic kidney disease, or unspecified chronic kidney disease: Secondary | ICD-10-CM | POA: Diagnosis present

## 2020-11-24 DIAGNOSIS — E11649 Type 2 diabetes mellitus with hypoglycemia without coma: Secondary | ICD-10-CM | POA: Diagnosis present

## 2020-11-24 DIAGNOSIS — Z794 Long term (current) use of insulin: Secondary | ICD-10-CM

## 2020-11-24 DIAGNOSIS — I428 Other cardiomyopathies: Secondary | ICD-10-CM | POA: Diagnosis present

## 2020-11-24 DIAGNOSIS — R627 Adult failure to thrive: Secondary | ICD-10-CM | POA: Diagnosis present

## 2020-11-24 DIAGNOSIS — N182 Chronic kidney disease, stage 2 (mild): Secondary | ICD-10-CM | POA: Diagnosis present

## 2020-11-24 DIAGNOSIS — E43 Unspecified severe protein-calorie malnutrition: Secondary | ICD-10-CM | POA: Diagnosis present

## 2020-11-24 DIAGNOSIS — G9341 Metabolic encephalopathy: Secondary | ICD-10-CM | POA: Diagnosis present

## 2020-11-24 DIAGNOSIS — I5042 Chronic combined systolic (congestive) and diastolic (congestive) heart failure: Secondary | ICD-10-CM | POA: Diagnosis present

## 2020-11-24 DIAGNOSIS — A419 Sepsis, unspecified organism: Principal | ICD-10-CM | POA: Diagnosis present

## 2020-11-24 DIAGNOSIS — I2510 Atherosclerotic heart disease of native coronary artery without angina pectoris: Secondary | ICD-10-CM | POA: Diagnosis present

## 2020-11-24 DIAGNOSIS — Z7902 Long term (current) use of antithrombotics/antiplatelets: Secondary | ICD-10-CM

## 2020-11-24 DIAGNOSIS — E1122 Type 2 diabetes mellitus with diabetic chronic kidney disease: Secondary | ICD-10-CM | POA: Diagnosis present

## 2020-11-24 DIAGNOSIS — J9601 Acute respiratory failure with hypoxia: Secondary | ICD-10-CM | POA: Diagnosis present

## 2020-11-24 DIAGNOSIS — Z833 Family history of diabetes mellitus: Secondary | ICD-10-CM

## 2020-11-24 DIAGNOSIS — Z823 Family history of stroke: Secondary | ICD-10-CM

## 2020-11-24 DIAGNOSIS — Z8249 Family history of ischemic heart disease and other diseases of the circulatory system: Secondary | ICD-10-CM

## 2020-11-24 DIAGNOSIS — N179 Acute kidney failure, unspecified: Secondary | ICD-10-CM | POA: Diagnosis present

## 2020-11-24 LAB — CBC WITH DIFFERENTIAL/PLATELET
Abs Immature Granulocytes: 0.03 10*3/uL (ref 0.00–0.07)
Basophils Absolute: 0 10*3/uL (ref 0.0–0.1)
Basophils Relative: 0 %
Eosinophils Absolute: 0 10*3/uL (ref 0.0–0.5)
Eosinophils Relative: 0 %
HCT: 34.6 % — ABNORMAL LOW (ref 36.0–46.0)
Hemoglobin: 10.5 g/dL — ABNORMAL LOW (ref 12.0–15.0)
Immature Granulocytes: 0 %
Lymphocytes Relative: 10 %
Lymphs Abs: 1.1 10*3/uL (ref 0.7–4.0)
MCH: 31.3 pg (ref 26.0–34.0)
MCHC: 30.3 g/dL (ref 30.0–36.0)
MCV: 103 fL — ABNORMAL HIGH (ref 80.0–100.0)
Monocytes Absolute: 0.5 10*3/uL (ref 0.1–1.0)
Monocytes Relative: 5 %
Neutro Abs: 9.4 10*3/uL — ABNORMAL HIGH (ref 1.7–7.7)
Neutrophils Relative %: 85 %
Platelets: 343 10*3/uL (ref 150–400)
RBC: 3.36 MIL/uL — ABNORMAL LOW (ref 3.87–5.11)
RDW: 13.9 % (ref 11.5–15.5)
WBC: 11.1 10*3/uL — ABNORMAL HIGH (ref 4.0–10.5)
nRBC: 0 % (ref 0.0–0.2)

## 2020-11-24 LAB — CBG MONITORING, ED
Glucose-Capillary: 78 mg/dL (ref 70–99)
Glucose-Capillary: 85 mg/dL (ref 70–99)
Glucose-Capillary: 185 mg/dL — ABNORMAL HIGH (ref 70–99)
Glucose-Capillary: <10 mg/dL — CL (ref 70–99)

## 2020-11-24 LAB — COMPREHENSIVE METABOLIC PANEL
ALT: 27 U/L (ref 0–44)
AST: 32 U/L (ref 15–41)
Albumin: 3.6 g/dL (ref 3.5–5.0)
Alkaline Phosphatase: 64 U/L (ref 38–126)
Anion gap: 13 (ref 5–15)
BUN: 16 mg/dL (ref 6–20)
CO2: 16 mmol/L — ABNORMAL LOW (ref 22–32)
Calcium: 9.3 mg/dL (ref 8.9–10.3)
Chloride: 108 mmol/L (ref 98–111)
Creatinine, Ser: 1.1 mg/dL — ABNORMAL HIGH (ref 0.44–1.00)
GFR, Estimated: 58 mL/min — ABNORMAL LOW (ref >60)
Glucose, Bld: 84 mg/dL (ref 70–99)
Potassium: 3.8 mmol/L (ref 3.5–5.1)
Sodium: 137 mmol/L (ref 135–145)
Total Bilirubin: 0.6 mg/dL (ref 0.3–1.2)
Total Protein: 7 g/dL (ref 6.5–8.1)

## 2020-11-24 LAB — APTT: aPTT: 27 seconds (ref 24–36)

## 2020-11-24 LAB — PROTIME-INR
INR: 1 (ref 0.8–1.2)
Prothrombin Time: 13.3 seconds (ref 11.4–15.2)

## 2020-11-24 LAB — AMMONIA: Ammonia: 22 umol/L (ref 9–35)

## 2020-11-24 LAB — LACTIC ACID, PLASMA: Lactic Acid, Venous: 4 mmol/L (ref 0.5–1.9)

## 2020-11-24 IMAGING — DX DG CHEST 1V PORT
1 series · 1 of 1 positions shown · non-contrast
Comparison: [DATE]

CLINICAL DATA: Sepsis

EXAM:
PORTABLE CHEST 1 VIEW

[chest]
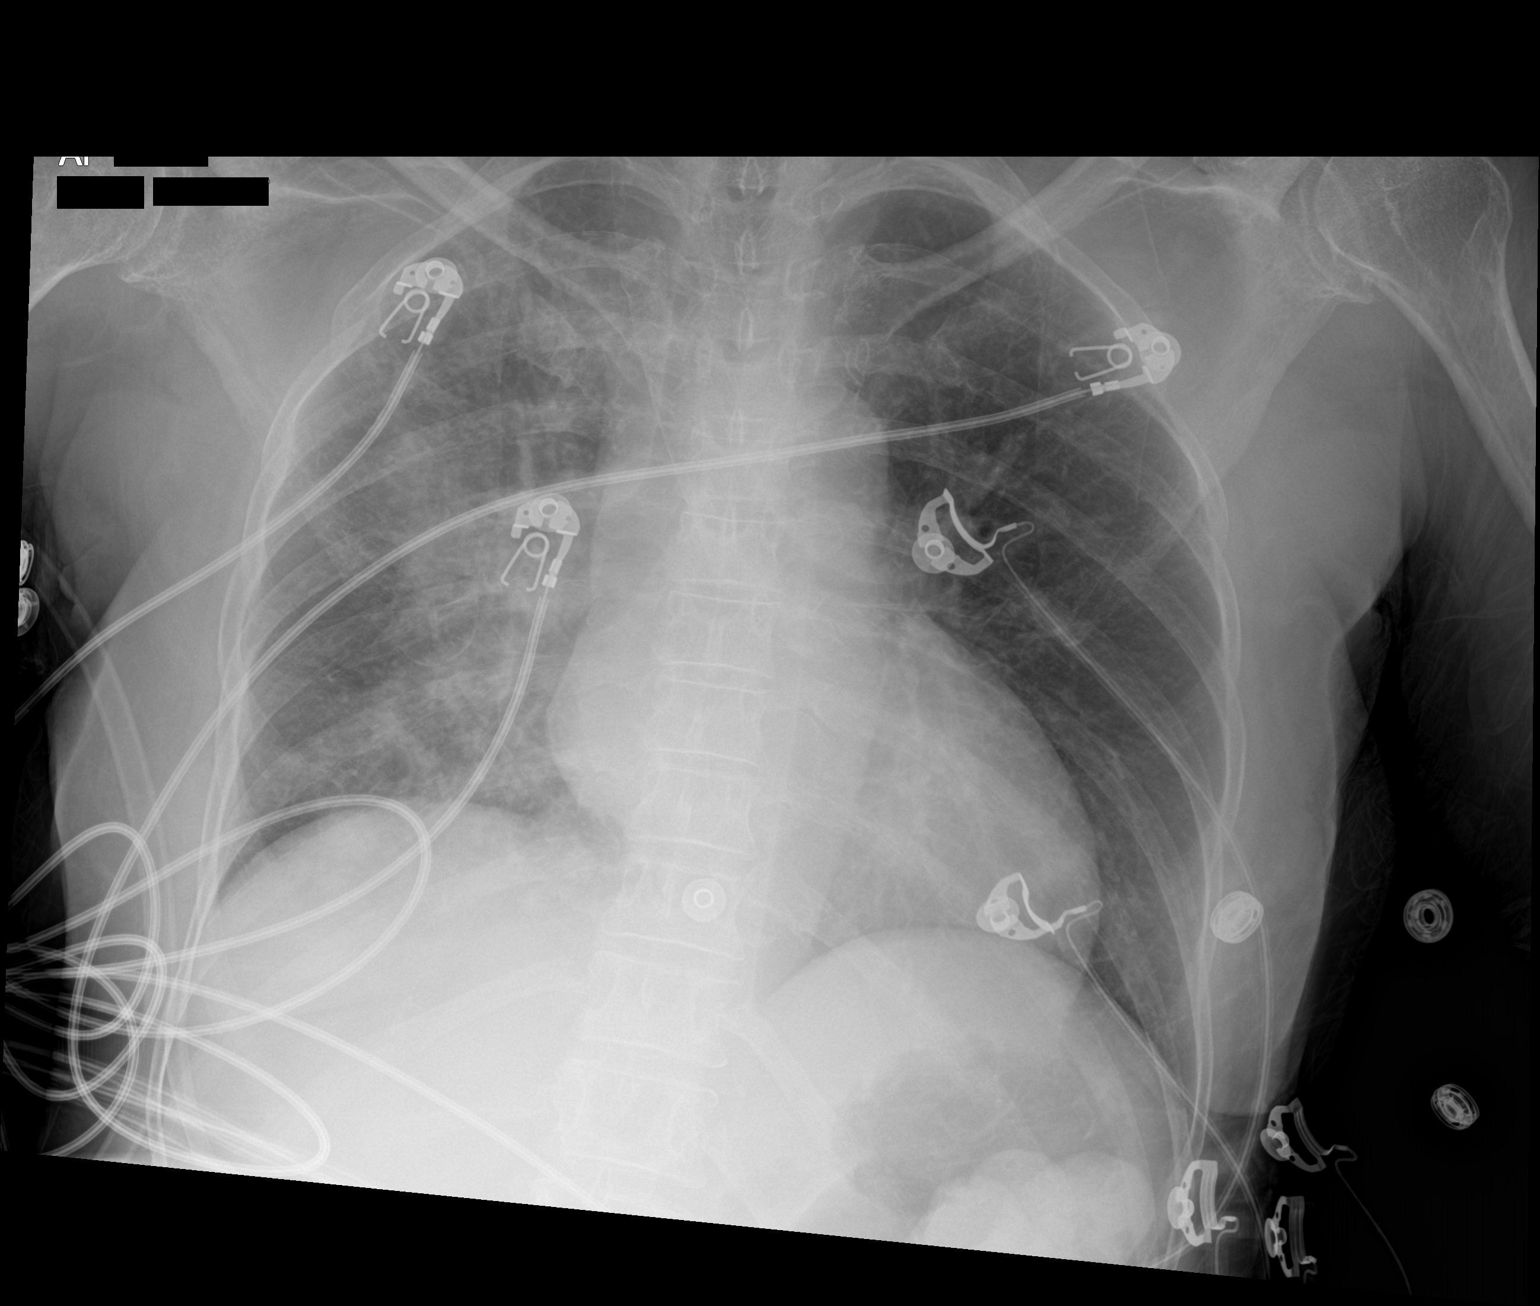

[1 of 1 positions shown; findings below may reference images not displayed]

FINDINGS: There is increased opacity within the right lung. No pleural
effusion or pneumothorax. Normal cardiomediastinal contours.
IMPRESSION: Right lung opacities, likely pneumonia.

## 2020-11-24 MED ORDER — DEXTROSE 50 % IV SOLN: 1.0000 | Freq: Once | INTRAVENOUS | Status: AC

## 2020-11-24 MED ORDER — LACTATED RINGERS IV BOLUS (SEPSIS)
500.0000 mL | Freq: Once | INTRAVENOUS | Status: AC
  Administered 2020-11-24: 500 mL via INTRAVENOUS

## 2020-11-24 MED ORDER — SODIUM CHLORIDE 0.9 % IV SOLN
2.0000 g | INTRAVENOUS | Status: DC
  Administered 2020-11-24 – 2020-11-29 (×6): 2 g via INTRAVENOUS
  Filled 2020-11-24 (×6): qty 20

## 2020-11-24 MED ORDER — LACTATED RINGERS IV BOLUS
1000.0000 mL | Freq: Once | INTRAVENOUS | Status: AC
  Administered 2020-11-24: 1000 mL via INTRAVENOUS

## 2020-11-24 MED ORDER — SODIUM CHLORIDE 0.9 % IV SOLN
500.0000 mg | INTRAVENOUS | Status: DC
  Administered 2020-11-24 – 2020-11-29 (×6): 500 mg via INTRAVENOUS
  Filled 2020-11-24 (×7): qty 500

## 2020-11-24 MED ORDER — DEXTROSE 50 % IV SOLN
INTRAVENOUS | Status: AC
  Administered 2020-11-24: 50 mL via INTRAVENOUS
  Filled 2020-11-24: qty 50

## 2020-11-24 MED ORDER — DEXTROSE 10 % IV SOLN: INTRAVENOUS | Status: DC

## 2020-11-24 NOTE — ED Notes (Signed)
Pt started drooling and vomiting, secretions suctioned orally. Pt placed on 3lpm via [place] per verbal order by provider.

## 2020-11-24 NOTE — ED Provider Notes (Signed)
[facility] EMERGENCY DEPARTMENT Provider Note   CSN: [id number] Arrival date & time: 11/24/20  2036     History Chief Complaint  Patient presents with   Hypoglycemia    Christine Benitez is a 60 y.o. female.   Hypoglycemia  60 year old female PMHx CAD, CHF (nonischemic, EF 30 to 35%, HTN, pHTN, DM (insulin-dependent), CVA, HLD, recently admitted for hypoglycemia and acute metabolic encephalopathy, presenting for hypoglycemia.  Per EMS, patient lives alone with family intermittently checking in on her.  Family concerned that patient has not been behaving herself since yesterday, received insulin this afternoon, and shortly thereafter was noted to have decreased responsiveness.  EMS was called, initial BGL 26.  EMS gave 125 mL D10 with improvement to 102.  She was noted to have saturations in the mid 60s prompting NRB placement with improvement to 100% (on RA at home).  IO inserted in right leg and NPA in right nare.  Remained stable on route, did not require any further interventions.  Remainder of history limited by patient's mental status change in acuity; level 5 caveat applies in context of critical illness  History obtained from patient's daughter, EMS, chart review     Past Medical History:  Diagnosis Date   Abnormal LFTs    a. 05/2014 -  ALT 52, alk phos 130.   Chronic systolic CHF (congestive heart failure) ([place])    a. Dx 05/2014 - echo at [facility] - EF 30-35%, moderate LVH, no rWMA, mild to moderate MR, moderate PH with PA pressure 60-36mmHg, mild to moderate pericardial effusion. EF 30-35% by cath.   History of blood transfusion 1986   "related to C-section"   Hypertension    Insulin dependent diabetes mellitus    Iron deficiency anemia    NICM (nonischemic cardiomyopathy) ([place])    a. [place] 06/11/14:  minor nonobstructive CAD with mild irregularities in the LAD less than 10-20%, LCx with mild disease less than 20%, no significant disease in the RCA, EF  30-35%.   Pericardial effusion    a. Mild-moderate pericardial effusion by echo at [facility].   Pulmonary hypertension ([place])    a. Moderate PH by echo at [facility].   Tobacco abuse     Patient Active Problem List   Diagnosis Date Noted   Severe sepsis ([place]) 11/25/2020   Hyperkalemia 11/11/2020   Constipation 11/03/2020   Cachexia ([place]) 11/03/2020   Malnutrition of moderate degree 11/03/2020   Hypoglycemia 11/02/2020   Proteus mirabilis infection 10/25/2020   Acute lower UTI 10/24/2020   Urinary tract infection 10/24/2020   Failure to thrive in adult 10/24/2020   Pericardial effusion    Iron deficiency anemia    Chronic systolic CHF (congestive heart failure) ([place])    Heart failure with reduced ejection fraction ([place]) 07/18/2020   History of CVA (cerebrovascular accident) 07/18/2020   Nonischemic cardiomyopathy ([place]) 07/18/2020   Type 2 diabetes mellitus with complication, with long-term current use of insulin ([place]) 07/18/2020   Tobacco use 07/18/2020   Mixed hyperlipidemia 07/18/2020   Abnormal echocardiogram 07/18/2020   Resides in long term care facility 06/20/2020   Protein-calorie malnutrition, severe 03/24/2020   Pain    Altered mental status    DNR (do not resuscitate)    DNI (do not intubate)    Encounter for hospice care discussion    Goals of care, counseling/discussion    Palliative care by specialist    AKI (acute kidney injury) ([place])    Cardiogenic shock ([place]) 03/14/2020  Acute urinary retention 03/13/2020   Chronic cholecystitis with calculus 03/12/2020   Cerebral thrombosis with cerebral infarction 03/12/2020   Abnormal LFTs    Hypertension    NICM (nonischemic cardiomyopathy) ([place])    Pulmonary hypertension ([place])    Tobacco abuse 06/10/2014   Diabetes mellitus ([place]) 06/10/2014   Family history of premature CAD 06/10/2014   History of blood transfusion 1986    Past Surgical History:  Procedure Laterality Date   APPENDECTOMY  ~ 2003   [place] WITH CORONARY ANGIOGRAM N/A 06/11/2014   Procedure: LEFT HEART CATHETERIZATION WITH CORONARY ANGIOGRAM;  Surgeon: Peter M Martinique, MD;  Location: [facility] CATH LAB;  Service: Cardiovascular;  Laterality: N/A;     OB History   No obstetric history on file.     Family History  Problem Relation Age of Onset   Coronary artery disease Mother    Diabetes Mother    Lupus Mother    Stroke Mother    Lung cancer Father     Social History   Tobacco Use   Smoking status: Former    Packs/day: 0.50    Years: 22.00    Pack years: 11.00    Types: Cigarettes    Quit date: 03/26/2015    Years since quitting: 5.6   Smokeless tobacco: Never  Vaping Use   Vaping Use: Never used  Substance Use Topics   Alcohol use: No   Drug use: No    Home Medications Prior to Admission medications   Medication Sig Start Date End Date Taking? Authorizing Provider  albuterol (VENTOLIN HFA) 108 (90 Base) MCG/ACT inhaler Inhale 2 puffs into the lungs every 6 (six) hours as needed for wheezing or shortness of breath. 10/28/20   Allie Bossier, MD  aspirin 81 MG EC tablet TAKE 1 TABLET (81 MG TOTAL) BY MOUTH DAILY. 09/10/14   Martinique, Peter M, MD  atorvastatin (LIPITOR) 40 MG tablet Take 1 tablet (40 mg total) by mouth every evening. 03/26/20   Sanjuan Dame, MD  clopidogrel (PLAVIX) 75 MG tablet Take 1 tablet (75 mg total) by mouth daily. 10/28/20 10/28/21  Allie Bossier, MD  dronabinol (MARINOL) 5 MG capsule Take 1 capsule (5 mg total) by mouth 2 (two) times daily before lunch and supper. 10/28/20   Allie Bossier, MD  feeding supplement (ENSURE ENLIVE / ENSURE PLUS) LIQD Take 237 mLs by mouth 2 (two) times daily between meals. 11/06/20   Hosie Poisson, MD  feeding supplement (ENSURE ENLIVE / ENSURE PLUS) LIQD Take 237 mLs by mouth 3 (three) times daily between meals. 11/18/20   Nicole Kindred A, DO  insulin aspart (NOVOLOG FLEXPEN) 100 UNIT/ML FlexPen Inject 8 Units into the  skin 3 (three) times daily with meals. 11/18/20   Ezekiel Slocumb, DO  insulin glargine (LANTUS) 100 UNIT/ML Solostar Pen Inject 10 Units into the skin 2 (two) times daily. 11/18/20   Ezekiel Slocumb, DO  Insulin Pen Needle 32G X 4 MM MISC use as directed 10/28/20   Allie Bossier, MD  ivabradine (CORLANOR) 5 MG TABS tablet Take 0.5 tablets (2.5 mg total) by mouth 2 (two) times daily with a meal. 10/16/20   Lyda Jester M, PA-C  mirtazapine (REMERON SOL-TAB) 15 MG disintegrating tablet Take 0.5 tablets (7.5 mg total) by mouth at bedtime. 03/26/20   Sanjuan Dame, MD  Multiple Vitamin (MULTIVITAMIN WITH MINERALS) TABS tablet Take 1 tablet by mouth daily. 11/07/20   Karleen Hampshire,  Jeoffrey Massed, MD  sodium zirconium cyclosilicate (LOKELMA) 10 g PACK packet Take 10 g by mouth 2 (two) times daily. 11/18/20 12/18/20  Ezekiel Slocumb, DO    Allergies    Patient has no known allergies.  Review of Systems   Review of Systems  Unable to perform ROS: Mental status change   Physical Exam Updated Vital Signs BP (!) 143/76   Pulse (!) 135   Temp (!) 96.4 F (35.8 C) (Rectal) Comment: provider aware  Resp 18   Ht $R'5\' 4"'jL$  (1.626 m)   Wt 49 kg   SpO2 100%   BMI 18.54 kg/m   Physical Exam Vitals and nursing note reviewed.  Constitutional:      Appearance: She is ill-appearing.  HENT:     Head: Normocephalic and atraumatic.     Mouth/Throat:     Mouth: Mucous membranes are moist.     Pharynx: Oropharynx is clear.  Eyes:     Extraocular Movements: Extraocular movements intact.     Pupils: Pupils are equal, round, and reactive to light.  Cardiovascular:     Rate and Rhythm: Regular rhythm. Tachycardia present.     Heart sounds: Murmur heard.    No friction rub. No gallop.  Pulmonary:     Effort: Pulmonary effort is normal.     Breath sounds: No stridor. No wheezing, rhonchi or rales.  Abdominal:     General: There is no distension.     Palpations: Abdomen is soft.     Tenderness: There is no  abdominal tenderness. There is no guarding or rebound.  Musculoskeletal:     Cervical back: Normal range of motion and neck supple.     Right lower leg: No edema.     Left lower leg: No edema.  Skin:    General: Skin is warm and dry.  Neurological:     Mental Status: She is alert. She is disoriented.     Comments: Mental status: Spontaneously alert, although not communicating well Speech: Nonverbal on exam CN II: Blinking to threat bilaterally CN III/IV/VI: PERRL, EOMI CN V: Unable to test CN VII: no facial droop CN VIII: no nystagmus, audition grossly intact to speech VN IX/X: Tolerating secretions CN XI: trapezius and SCM motor function intact CN XII: midline tongue w/o atrophy or fasciculation Moving all 4 extremities spontaneously Coordination: Grossly intact in context of pulling at wires and adjusting self in bed Gait: Unable to assess     ED Results / Procedures / Treatments   Labs (all labs ordered are listed, but only abnormal results are displayed) Labs Reviewed  LACTIC ACID, PLASMA - Abnormal; Notable for the following components:      Result Value   Lactic Acid, Venous 4.0 (*)    All other components within normal limits  LACTIC ACID, PLASMA - Abnormal; Notable for the following components:   Lactic Acid, Venous 5.0 (*)    All other components within normal limits  COMPREHENSIVE METABOLIC PANEL - Abnormal; Notable for the following components:   CO2 16 (*)    Creatinine, Ser 1.10 (*)    GFR, Estimated 58 (*)    All other components within normal limits  CBC WITH DIFFERENTIAL/PLATELET - Abnormal; Notable for the following components:   WBC 11.1 (*)    RBC 3.36 (*)    Hemoglobin 10.5 (*)    HCT 34.6 (*)    MCV 103.0 (*)    Neutro Abs 9.4 (*)    All other components within normal  limits  URINALYSIS, ROUTINE W REFLEX MICROSCOPIC - Abnormal; Notable for the following components:   APPearance CLOUDY (*)    Glucose, UA 150 (*)    Nitrite POSITIVE (*)     Bacteria, UA RARE (*)    All other components within normal limits  RAPID URINE DRUG SCREEN, HOSP PERFORMED - Abnormal; Notable for the following components:   Tetrahydrocannabinol POSITIVE (*)    All other components within normal limits  BASIC METABOLIC PANEL - Abnormal; Notable for the following components:   Glucose, Bld 320 (*)    Creatinine, Ser 1.07 (*)    All other components within normal limits  CBG MONITORING, ED - Abnormal; Notable for the following components:   Glucose-Capillary <10 (*)    All other components within normal limits  CBG MONITORING, ED - Abnormal; Notable for the following components:   Glucose-Capillary 185 (*)    All other components within normal limits  CBG MONITORING, ED - Abnormal; Notable for the following components:   Glucose-Capillary 110 (*)    All other components within normal limits  CBG MONITORING, ED - Abnormal; Notable for the following components:   Glucose-Capillary 53 (*)    All other components within normal limits  I-STAT ARTERIAL BLOOD GAS, ED - Abnormal; Notable for the following components:   pH, Arterial 7.320 (*)    pO2, Arterial 19 (*)    Acid-base deficit 4.0 (*)    HCT 31.0 (*)    Hemoglobin 10.5 (*)    All other components within normal limits  CBG MONITORING, ED - Abnormal; Notable for the following components:   Glucose-Capillary 262 (*)    All other components within normal limits  I-STAT ARTERIAL BLOOD GAS, ED - Abnormal; Notable for the following components:   pH, Arterial 7.342 (*)    pO2, Arterial 109 (*)    Acid-base deficit 4.0 (*)    HCT 31.0 (*)    Hemoglobin 10.5 (*)    All other components within normal limits  CBG MONITORING, ED - Abnormal; Notable for the following components:   Glucose-Capillary 154 (*)    All other components within normal limits  CBG MONITORING, ED - Abnormal; Notable for the following components:   Glucose-Capillary 118 (*)    All other components within normal limits  CULTURE,  BLOOD (SINGLE)  URINE CULTURE  CULTURE, BLOOD (SINGLE)  PROTIME-INR  APTT  AMMONIA  TSH  BLOOD GAS, ARTERIAL  BASIC METABOLIC PANEL  CBG MONITORING, ED  CBG MONITORING, ED    EKG EKG Interpretation  Date/Time:  Monday November 24 2020 20:56:05 EDT Ventricular Rate:  141 PR Interval:  97 QRS Duration: 81 QT Interval:  368 QTC Calculation: 564 R Axis:   53 Text Interpretation: Sinus or ectopic atrial tachycardia Borderline repolarization abnormality Prolonged QT interval Confirmed by Merrily Pew [phone]) on 11/24/2020 11:18:01 PM  Radiology CT HEAD WO CONTRAST  Result Date: 11/25/2020 CLINICAL DATA:  [place]irium EXAM: CT HEAD WITHOUT CONTRAST TECHNIQUE: Contiguous axial images were obtained from the base of the skull through the vertex without intravenous contrast. COMPARISON:  10/24/2020 FINDINGS: Brain: There is no mass, hemorrhage or extra-axial collection. There is generalized atrophy without lobar predilection. Hypodensity of the white matter is most commonly associated with chronic microvascular disease. Vascular: No abnormal hyperdensity of the major intracranial arteries or dural venous sinuses. No intracranial atherosclerosis. Skull: The visualized skull base, calvarium and extracranial soft tissues are normal. Sinuses/Orbits: No fluid levels or advanced mucosal thickening of the visualized paranasal sinuses.  No mastoid or middle ear effusion. The orbits are normal. IMPRESSION: Generalized atrophy and chronic microvascular ischemia without acute intracranial abnormality. Electronically Signed   By: Ulyses Jarred M.D.   On: 11/25/2020 03:53   DG Chest Port 1 View  Result Date: 11/24/2020 CLINICAL DATA:  Sepsis EXAM: PORTABLE CHEST 1 VIEW COMPARISON:  10/24/2020 FINDINGS: There is increased opacity within the right lung. No pleural effusion or pneumothorax. Normal cardiomediastinal contours. IMPRESSION: Right lung opacities, likely pneumonia. Electronically Signed   By: Ulyses Jarred  M.D.   On: 11/24/2020 21:40   CT CHEST ABDOMEN PELVIS WO CONTRAST  Result Date: 11/25/2020 CLINICAL DATA:  Respiratory failure EXAM: CT CHEST, ABDOMEN AND PELVIS WITHOUT CONTRAST TECHNIQUE: Multidetector CT imaging of the chest, abdomen and pelvis was performed following the standard protocol without IV contrast. COMPARISON:  Chest radiograph dated 11/24/2020. CTA chest dated 06/09/2014. FINDINGS: CT CHEST FINDINGS Cardiovascular: Heart is normal in size.  No pericardial effusion. No evidence of thoracic aortic aneurysm. Mild atherosclerotic calcifications of the descending thoracic aorta. Three vessel coronary atherosclerosis. Mediastinum/Nodes: No suspicious mediastinal lymphadenopathy. Visualized thyroid is unremarkable. Lungs/Pleura: Multifocal patchy opacities predominantly in the posterior right upper lobe and posterior right lower lobe, with additional tree-in-bud nodularity throughout the right lung, compatible with multifocal pneumonia. Mild patchy opacity in the posterior left lower lobe, possibly atelectasis. Mild centrilobular and paraseptal emphysematous changes, upper lung predominant. No pleural effusion or pneumothorax. Musculoskeletal: Visualized osseous structures are within normal limits. CT ABDOMEN PELVIS FINDINGS Hepatobiliary: Unenhanced liver is unremarkable. Gallbladder is unremarkable. No intrahepatic or extrahepatic ductal dilatation. Pancreas: Within normal limits. Spleen: Within normal limits. Adrenals/Urinary Tract: Adrenal glands are within normal limits. Left renal atrophy. Right kidney is within normal limits. No renal calculi or hydronephrosis. Bladder is within normal limits. Stomach/Bowel: Stomach is within normal limits. No evidence of bowel obstruction. Appendix is not discretely visualized. Vascular/Lymphatic: No evidence of abdominal aortic aneurysm. Atherosclerotic calcifications of the abdominal aorta and branch vessels. No suspicious abdominopelvic lymphadenopathy.  Reproductive: Uterus is within normal limits. No adnexal masses. Other: No abdominopelvic ascites. Musculoskeletal: Visualized osseous structures are within normal limits. IMPRESSION: Multifocal right lung pneumonia. Mild left renal atrophy. No acute findings in the abdomen/pelvis. Electronically Signed   By: Julian Hy M.D.   On: 11/25/2020 03:54    Procedures Procedures   Medications Ordered in ED Medications  cefTRIAXone (ROCEPHIN) 2 g in sodium chloride 0.9 % 100 mL IVPB (0 g Intravenous Stopped 11/24/20 2242)  azithromycin (ZITHROMAX) 500 mg in sodium chloride 0.9 % 250 mL IVPB (0 mg Intravenous Stopped 11/24/20 2345)  sodium bicarbonate 150 mEq in dextrose 5 % 1,150 mL infusion ( Intravenous New Bag/Given 11/25/20 0150)  dextrose 10 % infusion ( Intravenous New Bag/Given 11/25/20 0216)  lactated ringers bolus 500 mL (0 mLs Intravenous Stopped 11/24/20 2223)  dextrose 50 % solution 50 mL (50 mLs Intravenous Given 11/24/20 2259)  lactated ringers bolus 1,000 mL (0 mLs Intravenous Stopped 11/25/20 0101)  lactated ringers bolus 1,000 mL (0 mLs Intravenous Stopped 11/25/20 0217)  dextrose 50 % solution 50 mL (50 mLs Intravenous Given 11/25/20 0152)    ED Course  I have reviewed the triage vital signs and the nursing notes.  Pertinent labs & imaging results that were available during my care of the patient were reviewed by me and considered in my medical decision making (see chart for details).    MDM Rules/Calculators/A&P  This is a 60 year old female PMHx diabetes (on insulin), recent hospitalization for encephalopathy and hypoglycemia, presenting for altered mentation since yesterday and hypoglycemia.  Here, she is noted to be hypothermic, tachycardic in the 130s to 140s, stable pressures.  She is disoriented but alert.  Initial BGL's on arrival in the upper 70s low 80s  Initial interventions: Questionable sepsis protocol initiated in context of hypothermia, LR  500 mg administered, NRB set at 15 LPM with plan to wean off gradually  DDx included: Hypoglycemia, unintentional medication overdose, poor glucose intake, sepsis, arrhythmia, metabolic derangement, dehydration  All studies independently reviewed by myself, d/w the attending physician, factored into my MDM. -Initial BGL low 80s, upper 70s -EKG: Sinus tachycardia 141 bpm, normal axis, QTC prolongation but otherwise normal intervals, no ST/T changes; increased rate compared to prior from 11/11/2020 -CXR: Right lung opacities, likely pneumonia in clinical context -CMP: Bicarb 16, AG 13 -CBCd: WBC 11.1 -Lactic acid: 4.0->5.0 -Unremarkable: PT/INR, APTT, ammonia  Presentation appears most consistent with hypoglycemia, possibly associated with overmedication versus poor p.o. intake.  Additionally concern for pneumonia sepsis with significant lactic acidosis.  Does not appear to have any EKG red flags to suggest arrhythmia outside of her sinus tachycardia.  Dehydration could also play a part in her tachycardia.  Upon resulting of CXR, full code sepsis initiated, additional blood culture drawn, empiric antibiotics for CAP initiated, and additional liter of fluids administered to total 30 cc/kg.  She does have a history of CHF, but on POCUS was noted to have collapsed IVC, suggesting room for fluid resuscitation.  Approximately 1030, began weaning patient off of O2 with good toleration down to 3 LPM via nasal cannula when she had small amount of NBNB vomiting, appeared somnolent.  Patient was suctioned, but was noted to have saturations in the mid 80s and coarse breath sounds concerning for aspiration.  BGL obtained at 2251, found to be undetectable, so when ampule of D50 administered and D10 drip initiated. Improved responsiveness after initiation of D10 drip, repeat CBG 2314 was 185.  Sats maintained in mid to upper 90s on 6 LPM via nasal cannula.  Given improved responsiveness and maintaining sats on nasal  cannula, intubation deferred.  ICU was contacted for admission for recurrent hypoglycemia, acute hypoxic respiratory failure, pneumonia sepsis, significant lactic acidosis, and encephalopathy with risk for eventual intubation.  Recommended reaching out to hospital service for admission.  Hospital service was contacted and agreed to admit the patient.  Patient's family was updated on plan, they understand agree.  Patient critically ill but hemodynamically stable on reevaluation.  Subsequently admitted.  final Clinical Impression(s) / ED Diagnoses Final diagnoses:  Sepsis due to pneumonia ([place])  Acute respiratory failure with hypoxia ([place])  Hypoglycemia  Lactic acidosis  Encephalopathy    Rx / DC Orders ED Discharge Orders     None        Levin Bacon, MD 11/25/20 [id number]    Elnora Morrison, MD 11/26/20 0021

## 2020-11-24 NOTE — ED Provider Notes (Signed)
ATTENDING SUPERVISORY NOTE I have personally viewed the imaging studies performed. I have personally seen and examined the patient, and discussed the plan of care with the resident.  I have reviewed the documentation of the resident and agree.  No diagnosis found.  .Critical Care  Date/Time: 11/24/2020 11:44 PM Performed by: Blane Ohara, MD Authorized by: Blane Ohara, MD   Critical care provider statement:    Critical care time (minutes):  80   Critical care start time:  11/24/2020 9:30 PM   Critical care end time:  11/24/2020 11:30 PM   Critical care was necessary to treat or prevent imminent or life-threatening deterioration of the following conditions:  Sepsis   Critical care was time spent personally by me on the following activities:  Discussions with consultants, evaluation of patient's response to treatment, examination of patient, ordering and performing treatments and interventions, ordering and review of laboratory studies, ordering and review of radiographic studies, pulse oximetry, re-evaluation of patient's condition, obtaining history from patient or surrogate and review of old charts    Blane Ohara, MD 11/26/20 [phone]

## 2020-11-24 NOTE — ED Notes (Signed)
ED Provider at bedside. 

## 2020-11-24 NOTE — ED Triage Notes (Signed)
BIB [facility] from home. Pt lives alone but family checks on her. Brother called EMS. When EMS arrived pt was unresponsive. CBG 26. EMS gave D10. CBG 102. Pt has I.O. in right leg. NPA in R nare. Pt is now awake but confused and pulling at lines. Initial heart rate 150 went down to 120 in route. Initial O2 65% room air. NRB placed O2 96%.   120 heart rate  172/98

## 2020-11-24 NOTE — Sepsis Progress Note (Signed)
Following per sepsis protocol   

## 2020-11-24 NOTE — ED Notes (Signed)
Warm blankets provided.

## 2020-11-25 ENCOUNTER — Inpatient Hospital Stay (HOSPITAL_COMMUNITY): Payer: Medicaid Other

## 2020-11-25 ENCOUNTER — Encounter (HOSPITAL_COMMUNITY): Payer: Self-pay | Admitting: Internal Medicine

## 2020-11-25 DIAGNOSIS — A419 Sepsis, unspecified organism: Secondary | ICD-10-CM | POA: Diagnosis present

## 2020-11-25 DIAGNOSIS — I5042 Chronic combined systolic (congestive) and diastolic (congestive) heart failure: Secondary | ICD-10-CM | POA: Diagnosis present

## 2020-11-25 DIAGNOSIS — J9601 Acute respiratory failure with hypoxia: Secondary | ICD-10-CM | POA: Diagnosis present

## 2020-11-25 DIAGNOSIS — Z8249 Family history of ischemic heart disease and other diseases of the circulatory system: Secondary | ICD-10-CM | POA: Diagnosis not present

## 2020-11-25 DIAGNOSIS — I2510 Atherosclerotic heart disease of native coronary artery without angina pectoris: Secondary | ICD-10-CM | POA: Diagnosis present

## 2020-11-25 DIAGNOSIS — J189 Pneumonia, unspecified organism: Secondary | ICD-10-CM

## 2020-11-25 DIAGNOSIS — E1165 Type 2 diabetes mellitus with hyperglycemia: Secondary | ICD-10-CM | POA: Diagnosis not present

## 2020-11-25 DIAGNOSIS — E785 Hyperlipidemia, unspecified: Secondary | ICD-10-CM | POA: Diagnosis present

## 2020-11-25 DIAGNOSIS — G9341 Metabolic encephalopathy: Secondary | ICD-10-CM | POA: Diagnosis present

## 2020-11-25 DIAGNOSIS — Z823 Family history of stroke: Secondary | ICD-10-CM | POA: Diagnosis not present

## 2020-11-25 DIAGNOSIS — Z681 Body mass index (BMI) 19 or less, adult: Secondary | ICD-10-CM | POA: Diagnosis not present

## 2020-11-25 DIAGNOSIS — Z833 Family history of diabetes mellitus: Secondary | ICD-10-CM | POA: Diagnosis not present

## 2020-11-25 DIAGNOSIS — E872 Acidosis: Secondary | ICD-10-CM | POA: Diagnosis present

## 2020-11-25 DIAGNOSIS — N179 Acute kidney failure, unspecified: Secondary | ICD-10-CM | POA: Diagnosis present

## 2020-11-25 DIAGNOSIS — R64 Cachexia: Secondary | ICD-10-CM | POA: Diagnosis present

## 2020-11-25 DIAGNOSIS — I272 Pulmonary hypertension, unspecified: Secondary | ICD-10-CM | POA: Diagnosis present

## 2020-11-25 DIAGNOSIS — E43 Unspecified severe protein-calorie malnutrition: Secondary | ICD-10-CM | POA: Diagnosis present

## 2020-11-25 DIAGNOSIS — I130 Hypertensive heart and chronic kidney disease with heart failure and stage 1 through stage 4 chronic kidney disease, or unspecified chronic kidney disease: Secondary | ICD-10-CM | POA: Diagnosis present

## 2020-11-25 DIAGNOSIS — E11649 Type 2 diabetes mellitus with hypoglycemia without coma: Secondary | ICD-10-CM | POA: Diagnosis present

## 2020-11-25 DIAGNOSIS — Z87891 Personal history of nicotine dependence: Secondary | ICD-10-CM | POA: Diagnosis not present

## 2020-11-25 DIAGNOSIS — Z8673 Personal history of transient ischemic attack (TIA), and cerebral infarction without residual deficits: Secondary | ICD-10-CM | POA: Diagnosis not present

## 2020-11-25 DIAGNOSIS — R6520 Severe sepsis without septic shock: Secondary | ICD-10-CM

## 2020-11-25 DIAGNOSIS — E1122 Type 2 diabetes mellitus with diabetic chronic kidney disease: Secondary | ICD-10-CM | POA: Diagnosis present

## 2020-11-25 DIAGNOSIS — I428 Other cardiomyopathies: Secondary | ICD-10-CM | POA: Diagnosis present

## 2020-11-25 DIAGNOSIS — Z66 Do not resuscitate: Secondary | ICD-10-CM | POA: Diagnosis present

## 2020-11-25 LAB — BASIC METABOLIC PANEL
Anion gap: 7 (ref 5–15)
Anion gap: 7 (ref 5–15)
Anion gap: 12 (ref 5–15)
BUN: 13 mg/dL (ref 6–20)
BUN: 13 mg/dL (ref 6–20)
BUN: 10 mg/dL (ref 6–20)
CO2: 24 mmol/L (ref 22–32)
CO2: 26 mmol/L (ref 22–32)
CO2: 21 mmol/L — ABNORMAL LOW (ref 22–32)
Calcium: 9.1 mg/dL (ref 8.9–10.3)
Calcium: 9.2 mg/dL (ref 8.9–10.3)
Calcium: 9.2 mg/dL (ref 8.9–10.3)
Chloride: 106 mmol/L (ref 98–111)
Chloride: 104 mmol/L (ref 98–111)
Chloride: 103 mmol/L (ref 98–111)
Creatinine, Ser: 1.07 mg/dL — ABNORMAL HIGH (ref 0.44–1.00)
Creatinine, Ser: 1.05 mg/dL — ABNORMAL HIGH (ref 0.44–1.00)
Creatinine, Ser: 1.06 mg/dL — ABNORMAL HIGH (ref 0.44–1.00)
GFR, Estimated: >60 mL/min (ref >60)
GFR, Estimated: >60 mL/min (ref >60)
GFR, Estimated: >60 mL/min (ref >60)
Glucose, Bld: 320 mg/dL — ABNORMAL HIGH (ref 70–99)
Glucose, Bld: 189 mg/dL — ABNORMAL HIGH (ref 70–99)
Glucose, Bld: 195 mg/dL — ABNORMAL HIGH (ref 70–99)
Potassium: 4.5 mmol/L (ref 3.5–5.1)
Potassium: 4.3 mmol/L (ref 3.5–5.1)
Potassium: 4.1 mmol/L (ref 3.5–5.1)
Sodium: 137 mmol/L (ref 135–145)
Sodium: 137 mmol/L (ref 135–145)
Sodium: 136 mmol/L (ref 135–145)

## 2020-11-25 LAB — RAPID URINE DRUG SCREEN, HOSP PERFORMED
Amphetamines: NOT DETECTED
Barbiturates: NOT DETECTED
Benzodiazepines: NOT DETECTED
Cocaine: NOT DETECTED
Opiates: NOT DETECTED
Tetrahydrocannabinol: POSITIVE — AB

## 2020-11-25 LAB — I-STAT ARTERIAL BLOOD GAS, ED
Acid-base deficit: 4 mmol/L — ABNORMAL HIGH (ref 0.0–2.0)
Acid-base deficit: 4 mmol/L — ABNORMAL HIGH (ref 0.0–2.0)
Bicarbonate: 22.5 mmol/L (ref 20.0–28.0)
Bicarbonate: 21.6 mmol/L (ref 20.0–28.0)
Calcium, Ion: 1.26 mmol/L (ref 1.15–1.40)
Calcium, Ion: 1.3 mmol/L (ref 1.15–1.40)
HCT: 31 % — ABNORMAL LOW (ref 36.0–46.0)
HCT: 31 % — ABNORMAL LOW (ref 36.0–46.0)
Hemoglobin: 10.5 g/dL — ABNORMAL LOW (ref 12.0–15.0)
Hemoglobin: 10.5 g/dL — ABNORMAL LOW (ref 12.0–15.0)
O2 Saturation: 29 %
O2 Saturation: 98 %
Patient temperature: 96.4
Patient temperature: 97
Potassium: 4.6 mmol/L (ref 3.5–5.1)
Potassium: 4.1 mmol/L (ref 3.5–5.1)
Sodium: 142 mmol/L (ref 135–145)
Sodium: 141 mmol/L (ref 135–145)
TCO2: 24 mmol/L (ref 22–32)
TCO2: 23 mmol/L (ref 22–32)
pCO2 arterial: 43 mmHg (ref 32.0–48.0)
pCO2 arterial: 39.5 mmHg (ref 32.0–48.0)
pH, Arterial: 7.32 — ABNORMAL LOW (ref 7.350–7.450)
pH, Arterial: 7.342 — ABNORMAL LOW (ref 7.350–7.450)
pO2, Arterial: 19 mmHg — CL (ref 83.0–108.0)
pO2, Arterial: 109 mmHg — ABNORMAL HIGH (ref 83.0–108.0)

## 2020-11-25 LAB — CBG MONITORING, ED
Glucose-Capillary: 110 mg/dL — ABNORMAL HIGH (ref 70–99)
Glucose-Capillary: 118 mg/dL — ABNORMAL HIGH (ref 70–99)
Glucose-Capillary: 118 mg/dL — ABNORMAL HIGH (ref 70–99)
Glucose-Capillary: 130 mg/dL — ABNORMAL HIGH (ref 70–99)
Glucose-Capillary: 154 mg/dL — ABNORMAL HIGH (ref 70–99)
Glucose-Capillary: 19 mg/dL — CL (ref 70–99)
Glucose-Capillary: 202 mg/dL — ABNORMAL HIGH (ref 70–99)
Glucose-Capillary: 262 mg/dL — ABNORMAL HIGH (ref 70–99)
Glucose-Capillary: 272 mg/dL — ABNORMAL HIGH (ref 70–99)
Glucose-Capillary: 370 mg/dL — ABNORMAL HIGH (ref 70–99)
Glucose-Capillary: 53 mg/dL — ABNORMAL LOW (ref 70–99)
Glucose-Capillary: 81 mg/dL (ref 70–99)
Glucose-Capillary: 86 mg/dL (ref 70–99)
Glucose-Capillary: 98 mg/dL (ref 70–99)

## 2020-11-25 LAB — LACTIC ACID, PLASMA
Lactic Acid, Venous: 5 mmol/L (ref 0.5–1.9)
Lactic Acid, Venous: 3.4 mmol/L (ref 0.5–1.9)
Lactic Acid, Venous: 3.6 mmol/L (ref 0.5–1.9)

## 2020-11-25 LAB — I-STAT VENOUS BLOOD GAS, ED
Acid-Base Excess: 1 mmol/L (ref 0.0–2.0)
Bicarbonate: 25.5 mmol/L (ref 20.0–28.0)
Calcium, Ion: 1.16 mmol/L (ref 1.15–1.40)
HCT: 31 % — ABNORMAL LOW (ref 36.0–46.0)
Hemoglobin: 10.5 g/dL — ABNORMAL LOW (ref 12.0–15.0)
O2 Saturation: 69 %
Potassium: 4.3 mmol/L (ref 3.5–5.1)
Sodium: 140 mmol/L (ref 135–145)
TCO2: 27 mmol/L (ref 22–32)
pCO2, Ven: 37.7 mmHg — ABNORMAL LOW (ref 44.0–60.0)
pH, Ven: 7.438 — ABNORMAL HIGH (ref 7.250–7.430)
pO2, Ven: 35 mmHg (ref 32.0–45.0)

## 2020-11-25 LAB — URINALYSIS, ROUTINE W REFLEX MICROSCOPIC
Bilirubin Urine: NEGATIVE
Glucose, UA: 150 mg/dL — AB
Hgb urine dipstick: NEGATIVE
Ketones, ur: NEGATIVE mg/dL
Leukocytes,Ua: NEGATIVE
Nitrite: POSITIVE — AB
Protein, ur: NEGATIVE mg/dL
Specific Gravity, Urine: 1.011 (ref 1.005–1.030)
pH: 8 (ref 5.0–8.0)

## 2020-11-25 LAB — GLUCOSE, CAPILLARY
Glucose-Capillary: 294 mg/dL — ABNORMAL HIGH (ref 70–99)
Glucose-Capillary: 364 mg/dL — ABNORMAL HIGH (ref 70–99)
Glucose-Capillary: 319 mg/dL — ABNORMAL HIGH (ref 70–99)

## 2020-11-25 LAB — TSH: TSH: 0.46 u[IU]/mL (ref 0.350–4.500)

## 2020-11-25 LAB — CORTISOL: Cortisol, Plasma: 16.5 ug/dL

## 2020-11-25 IMAGING — CT CT CHEST-ABD-PELV W/O CM
2 of 4 series · 14 of 46 positions shown, 16 images · non-contrast
Comparison: Chest radiograph dated [DATE]. CTA chest dated
[DATE].

CLINICAL DATA: Respiratory failure

EXAM:
CT CHEST, ABDOMEN AND PELVIS WITHOUT CONTRAST
TECHNIQUE: Multidetector CT imaging of the chest, abdomen and pelvis was
performed following the standard protocol without IV contrast.

[Series 3: cap without · axial · non-contrast · 0.64mm/px · z∈[-552,-72]mm · 11 of 113 slices shown, 13 images]
[im 9/113  soft-tissue]
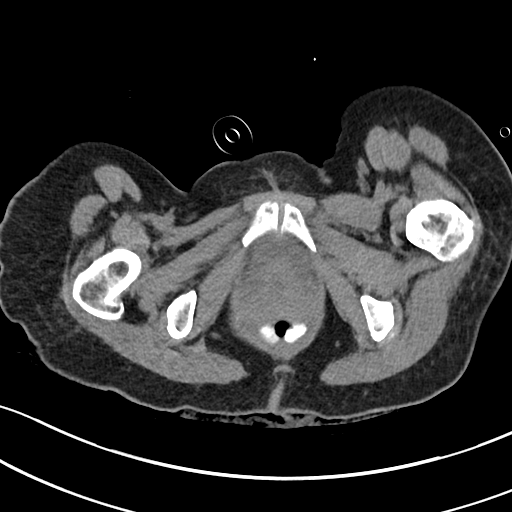
[im 9/113  bone]
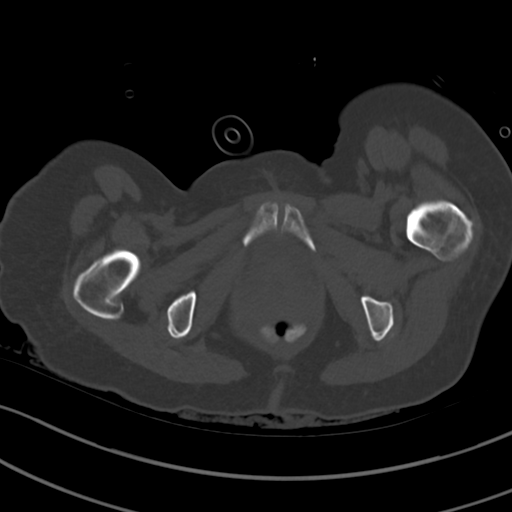
[im 17/113  soft-tissue]
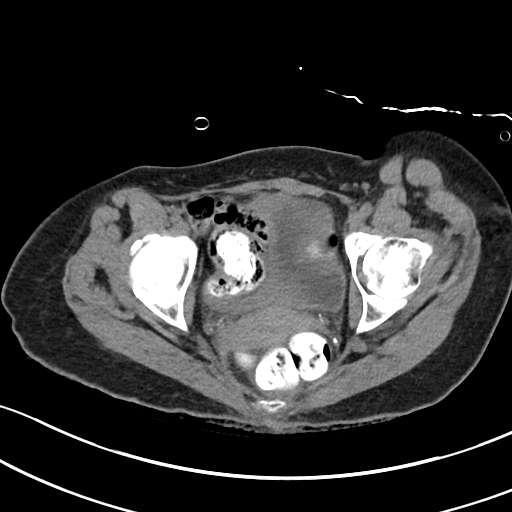
[im 25/113  soft-tissue]
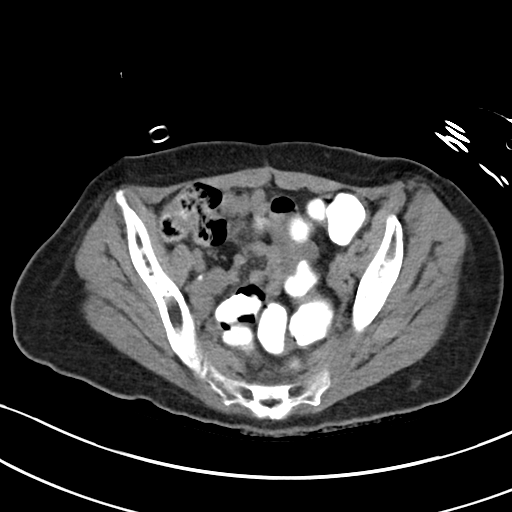
[im 41/113  soft-tissue]
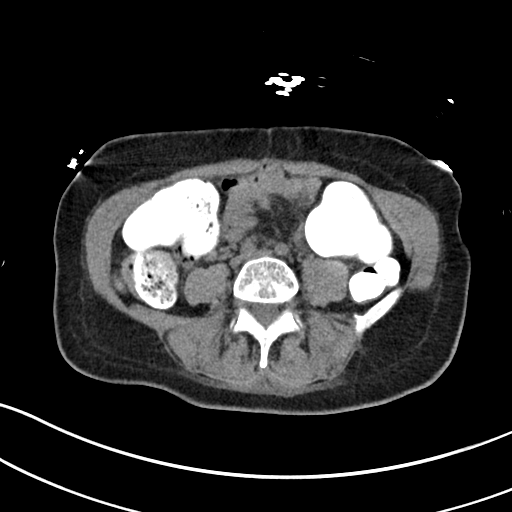
[im 49/113  soft-tissue]
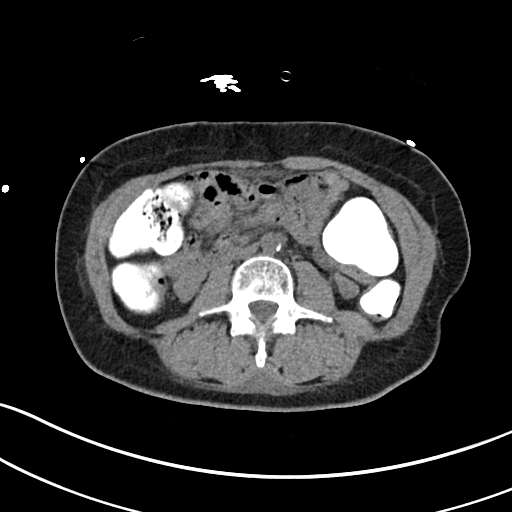
[im 57/113  soft-tissue]
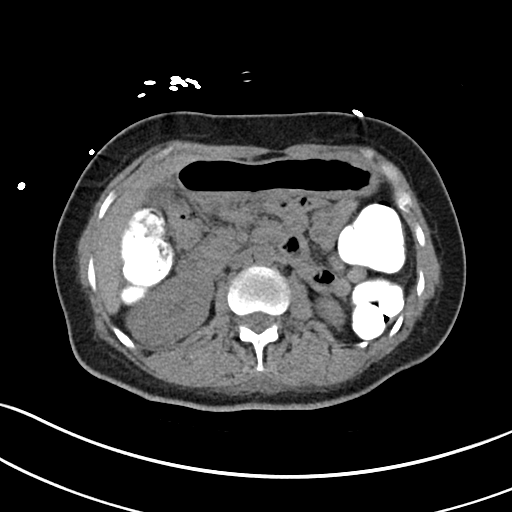
[im 65/113  soft-tissue]
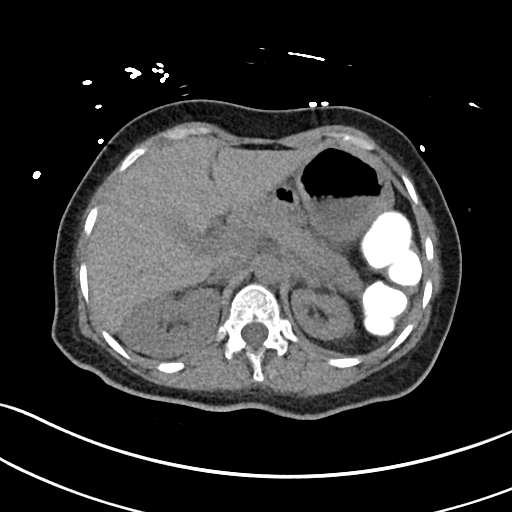
[im 73/113  soft-tissue]
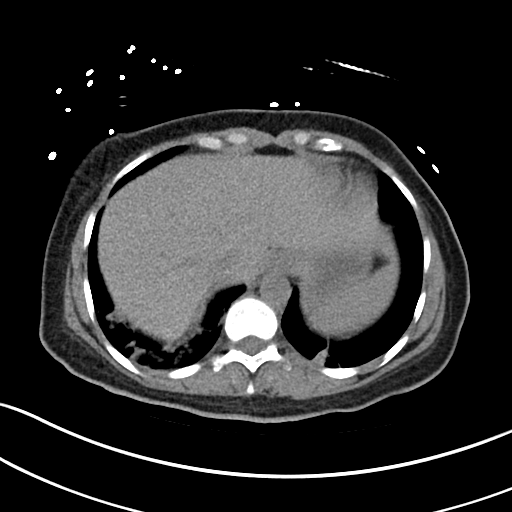
[im 89/113  soft-tissue]
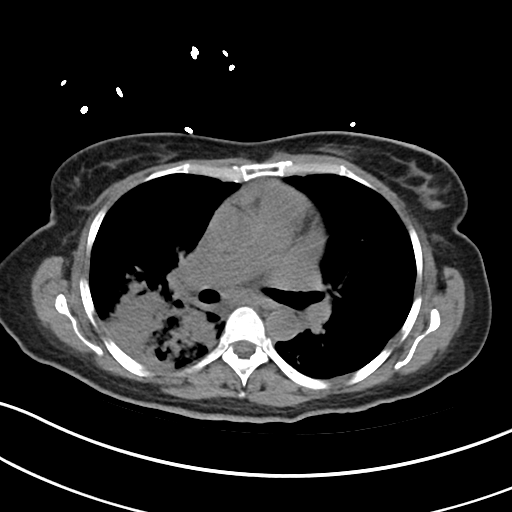
[im 89/113  bone]
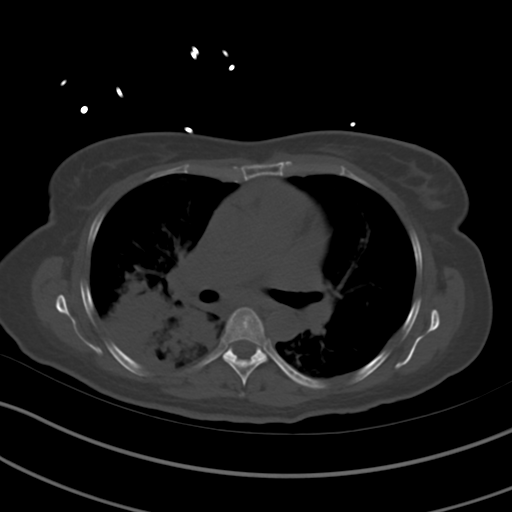
[im 97/113  soft-tissue]
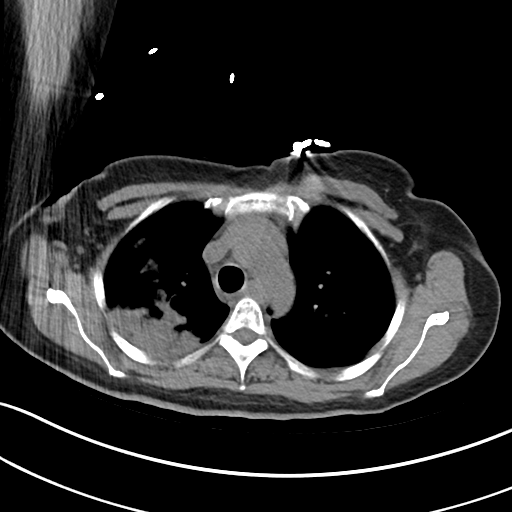
[im 105/113  soft-tissue]
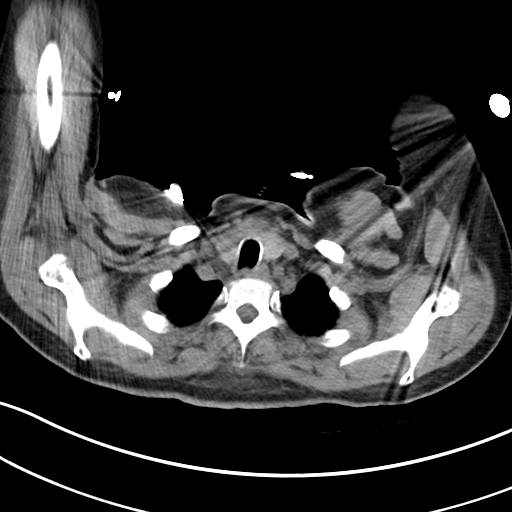

[Series 6: cor · coronal · 0.59mm/px · 3 of 68 slices shown]
[im 23/68  soft-tissue]
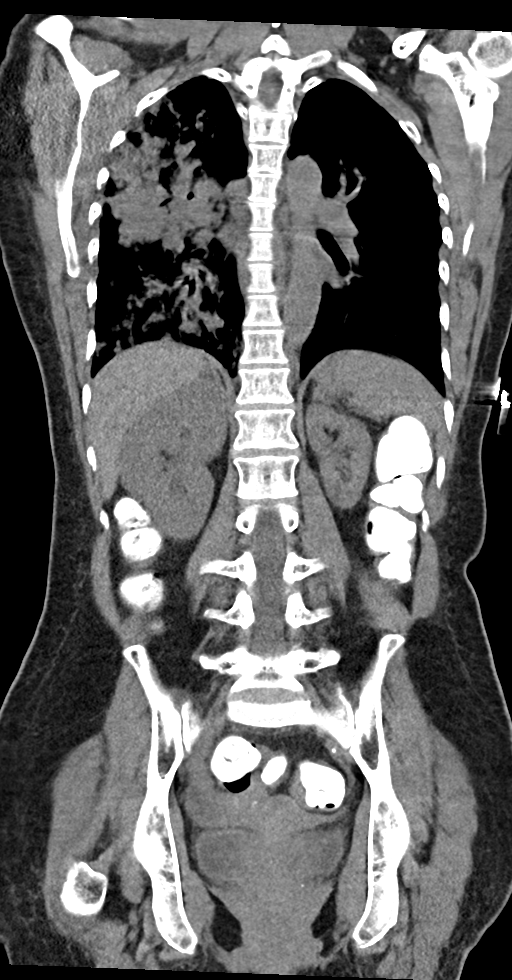
[im 30/68  soft-tissue]
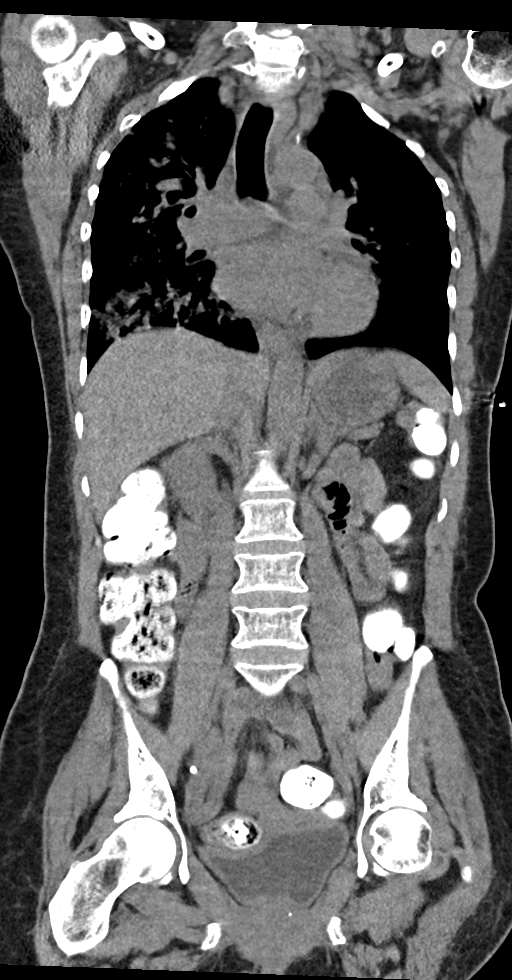
[im 38/68  soft-tissue]
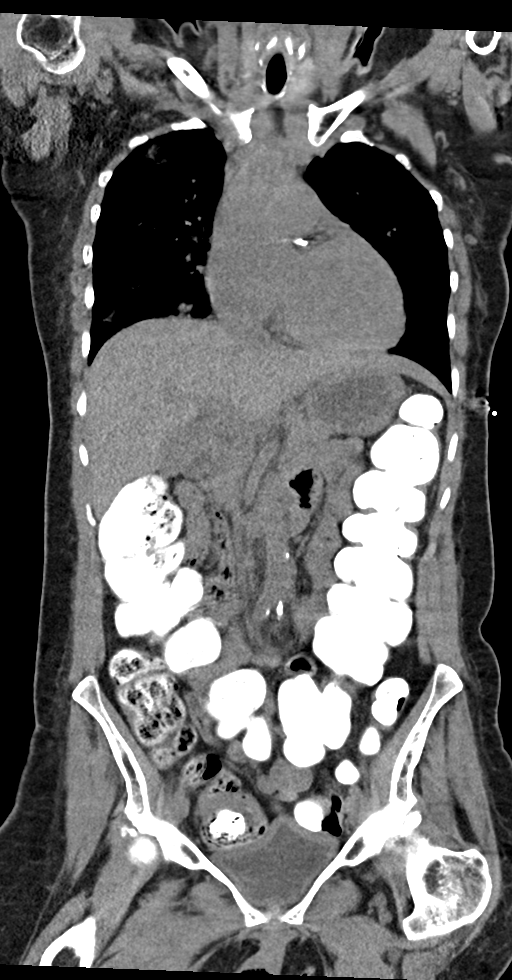

[14 of 46 positions shown; findings below may reference images not displayed]

FINDINGS: CT CHEST FINDINGS

Cardiovascular: Heart is normal in size.  No pericardial effusion.

No evidence of thoracic aortic aneurysm. Mild atherosclerotic
calcifications of the descending thoracic aorta.

Three vessel coronary atherosclerosis.

Mediastinum/Nodes: No suspicious mediastinal lymphadenopathy.

Visualized thyroid is unremarkable.

Lungs/Pleura: Multifocal patchy opacities predominantly in the
posterior right upper lobe and posterior right lower lobe, with
additional tree-in-bud nodularity throughout the right lung,
compatible with multifocal pneumonia.

Mild patchy opacity in the posterior left lower lobe, possibly
atelectasis.

Mild centrilobular and paraseptal emphysematous changes, upper lung
predominant.

No pleural effusion or pneumothorax.

Musculoskeletal: Visualized osseous structures are within normal
limits.

CT ABDOMEN PELVIS FINDINGS

Hepatobiliary: Unenhanced liver is unremarkable.

Gallbladder is unremarkable. No intrahepatic or extrahepatic ductal
dilatation.

Pancreas: Within normal limits.

Spleen: Within normal limits.

Adrenals/Urinary Tract: Adrenal glands are within normal limits.

Left renal atrophy. Right kidney is within normal limits. No renal
calculi or hydronephrosis.

Bladder is within normal limits.

Stomach/Bowel: Stomach is within normal limits.

No evidence of bowel obstruction. Appendix is not discretely
visualized.

Vascular/Lymphatic: No evidence of abdominal aortic aneurysm.

Atherosclerotic calcifications of the abdominal aorta and branch
vessels.

No suspicious abdominopelvic lymphadenopathy.

Reproductive: Uterus is within normal limits.

No adnexal masses.

Other: No abdominopelvic ascites.

Musculoskeletal: Visualized osseous structures are within normal
limits.
IMPRESSION: Multifocal right lung pneumonia.

Mild left renal atrophy.

No acute findings in the abdomen/pelvis.

## 2020-11-25 IMAGING — CT CT HEAD W/O CM
3 of 4 series · 15 of 37 positions shown, 18 images · non-contrast
Comparison: [DATE]

CLINICAL DATA: Delirium

EXAM:
CT HEAD WITHOUT CONTRAST
TECHNIQUE: Contiguous axial images were obtained from the base of the skull
through the vertex without intravenous contrast.

[Series 3: head wo · axial · 0.39mm/px · z∈[-152,-78]mm · 3 of 31 slices shown]
[im 8/31  brain]
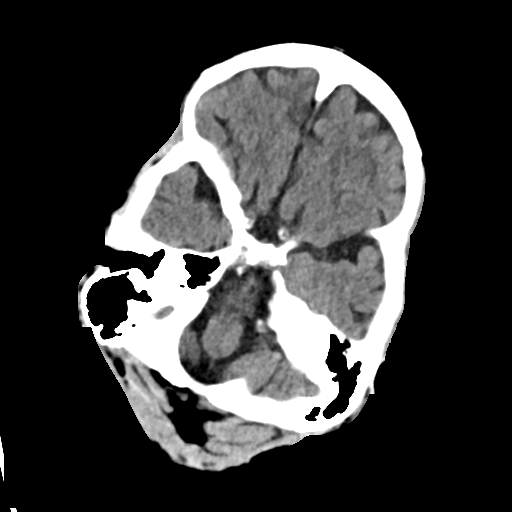
[im 16/31  brain]
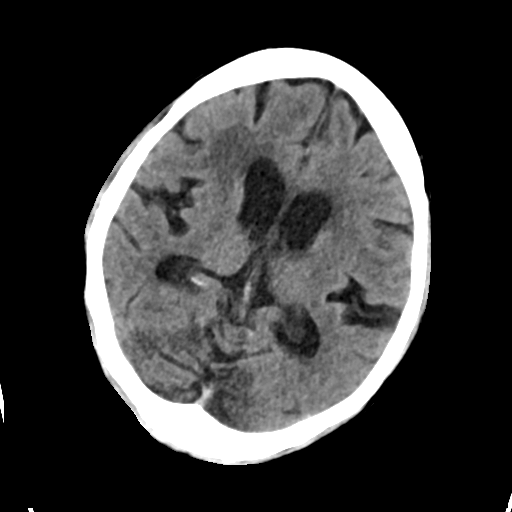
[im 23/31  brain]
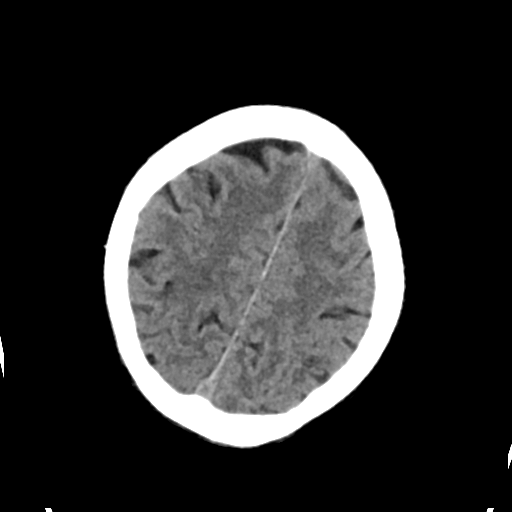

[Series 5: cor soft · axial · 0.31mm/px · z∈[-135,-18]mm · 9 of 61 slices shown, 12 images]
[im 7/61  brain]
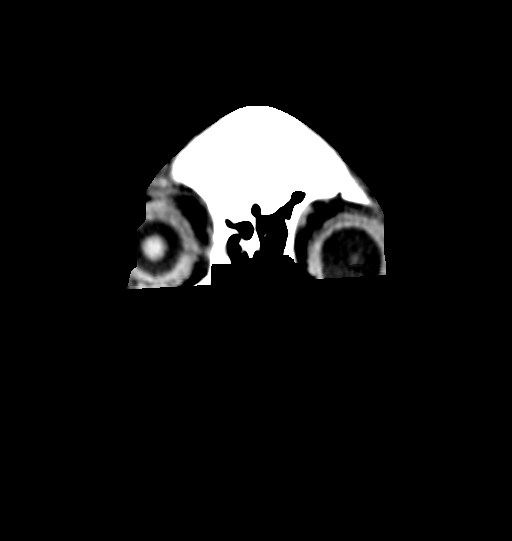
[im 7/61  bone]
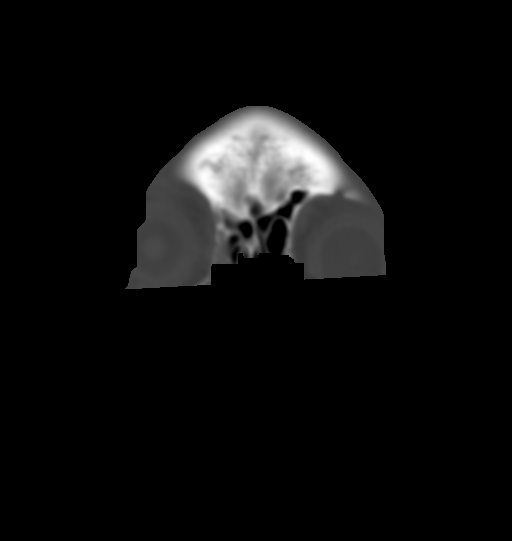
[im 13/61  brain]
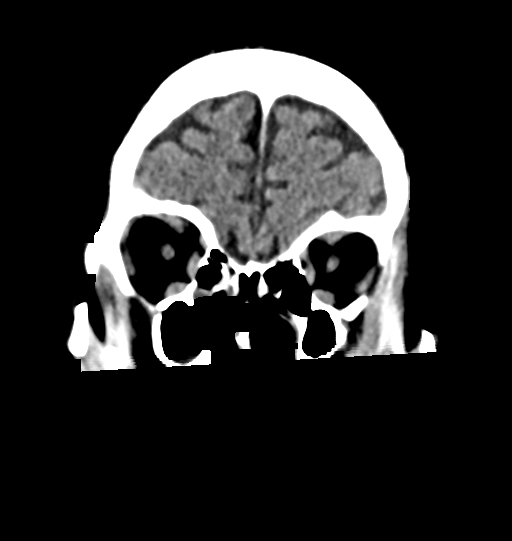
[im 19/61  brain]
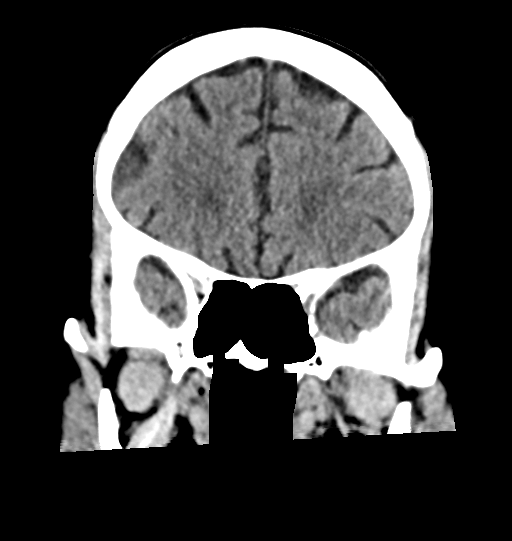
[im 25/61  brain]
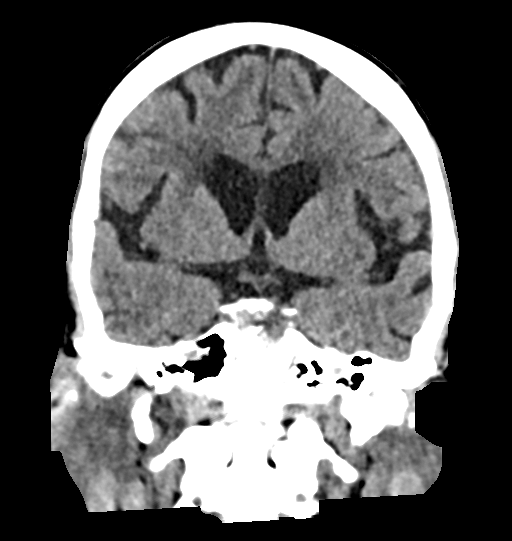
[im 31/61  brain]
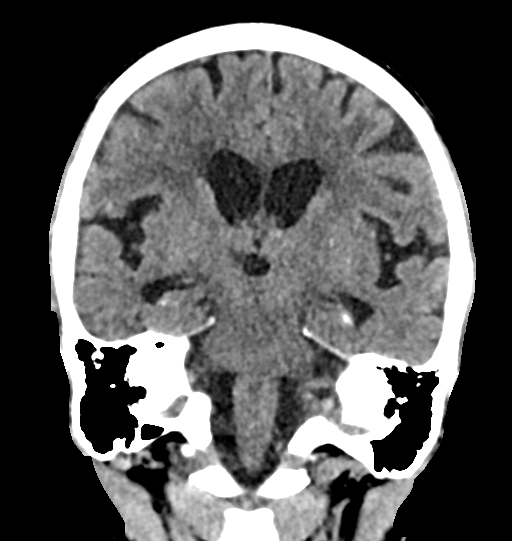
[im 31/61  bone]
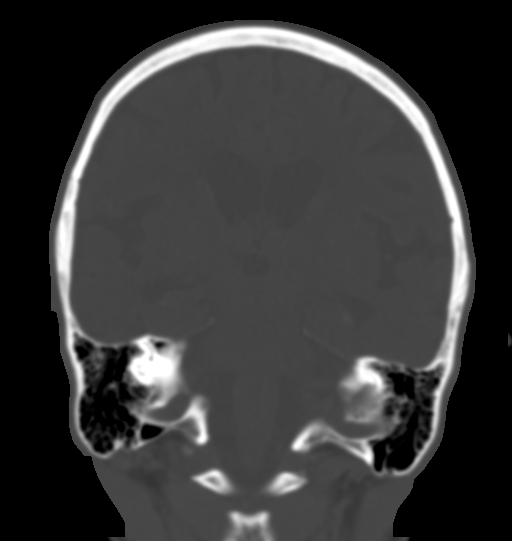
[im 37/61  brain]
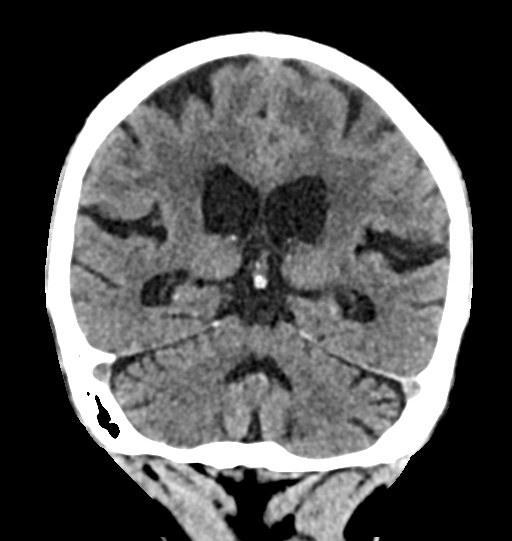
[im 43/61  brain]
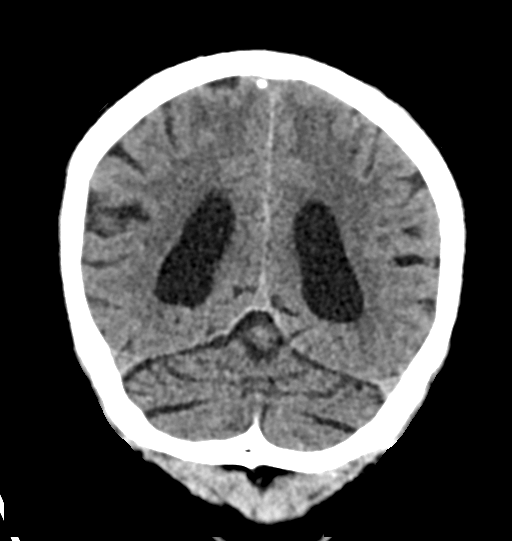
[im 49/61  brain]
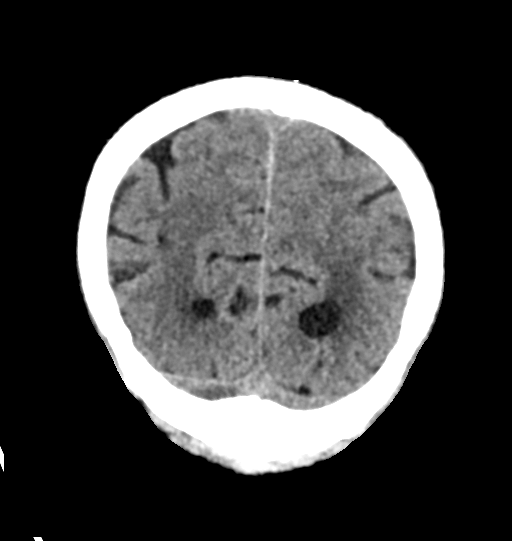
[im 55/61  brain]
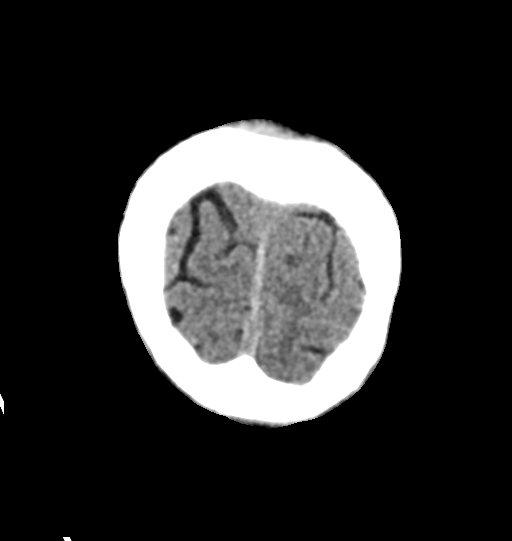
[im 55/61  bone]
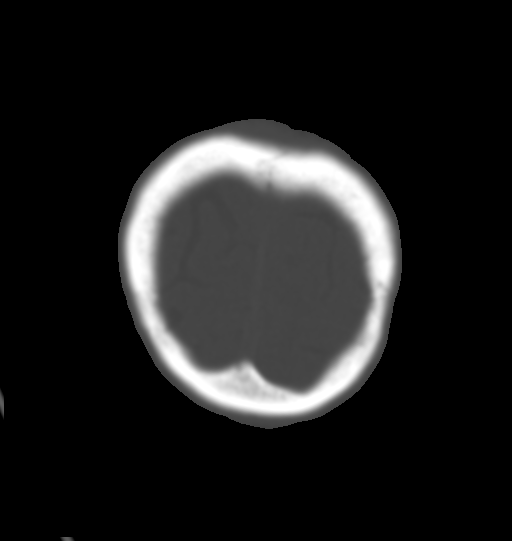

[Series 6: sag soft · sagittal · 0.33mm/px · 3 of 51 slices shown]
[im 17/51  brain]
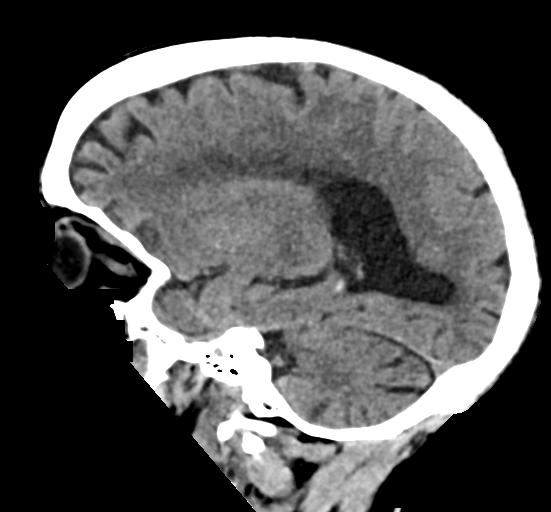
[im 26/51  brain]
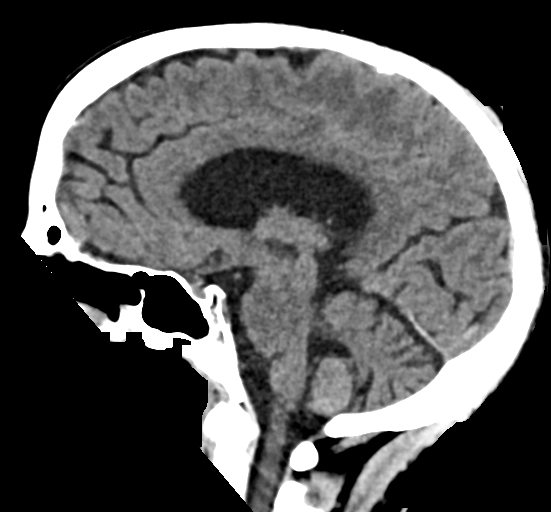
[im 34/51  brain]
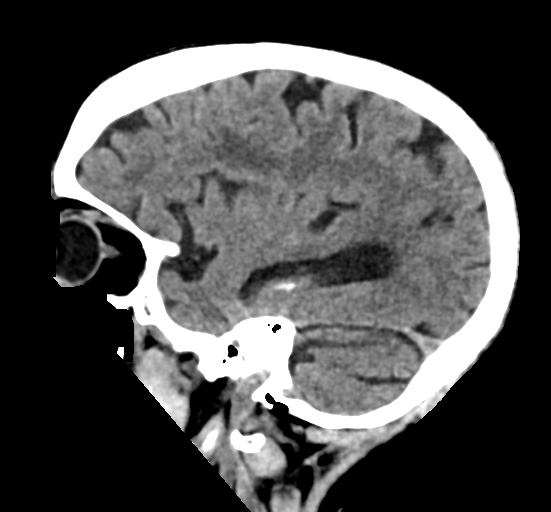

[15 of 37 positions shown; findings below may reference images not displayed]

FINDINGS: Brain: There is no mass, hemorrhage or extra-axial collection. There
is generalized atrophy without lobar predilection. Hypodensity of
the white matter is most commonly associated with chronic
microvascular disease.

Vascular: No abnormal hyperdensity of the major intracranial
arteries or dural venous sinuses. No intracranial atherosclerosis.

Skull: The visualized skull base, calvarium and extracranial soft
tissues are normal.

Sinuses/Orbits: No fluid levels or advanced mucosal thickening of
the visualized paranasal sinuses. No mastoid or middle ear effusion.
The orbits are normal.
IMPRESSION: Generalized atrophy and chronic microvascular ischemia without acute
intracranial abnormality.

## 2020-11-25 MED ORDER — DEXTROSE 50 % IV SOLN
1.0000 | Freq: Once | INTRAVENOUS | Status: AC
  Administered 2020-11-25: 50 mL via INTRAVENOUS

## 2020-11-25 MED ORDER — ASPIRIN EC 81 MG PO TBEC
81.0000 mg | DELAYED_RELEASE_TABLET | Freq: Every day | ORAL | Status: DC
  Administered 2020-11-25 – 2020-12-03 (×9): 81 mg via ORAL
  Filled 2020-11-25 (×9): qty 1

## 2020-11-25 MED ORDER — DEXTROSE 50 % IV SOLN
1.0000 | Freq: Once | INTRAVENOUS | Status: AC
  Filled 2020-11-25: qty 50

## 2020-11-25 MED ORDER — LACTATED RINGERS IV BOLUS
1000.0000 mL | Freq: Once | INTRAVENOUS | Status: AC
  Administered 2020-11-25: 1000 mL via INTRAVENOUS

## 2020-11-25 MED ORDER — DEXTROSE IN LACTATED RINGERS 5 % IV SOLN: INTRAVENOUS | Status: DC

## 2020-11-25 MED ORDER — IPRATROPIUM-ALBUTEROL 0.5-2.5 (3) MG/3ML IN SOLN: 3.0000 mL | Freq: Four times a day (QID) | RESPIRATORY_TRACT | Status: DC | PRN

## 2020-11-25 MED ORDER — DRONABINOL 2.5 MG PO CAPS
5.0000 mg | ORAL_CAPSULE | Freq: Two times a day (BID) | ORAL | Status: DC
  Administered 2020-11-25 – 2020-12-03 (×15): 5 mg via ORAL
  Filled 2020-11-25 (×16): qty 2

## 2020-11-25 MED ORDER — ENSURE ENLIVE PO LIQD
237.0000 mL | Freq: Two times a day (BID) | ORAL | Status: DC
  Administered 2020-11-25 – 2020-11-26 (×3): 237 mL via ORAL
  Filled 2020-11-25: qty 237

## 2020-11-25 MED ORDER — IVABRADINE HCL 5 MG PO TABS
2.5000 mg | ORAL_TABLET | Freq: Two times a day (BID) | ORAL | Status: DC
  Administered 2020-11-25 – 2020-12-03 (×16): 2.5 mg via ORAL
  Filled 2020-11-25 (×19): qty 1

## 2020-11-25 MED ORDER — DEXTROSE 50 % IV SOLN
INTRAVENOUS | Status: AC
  Administered 2020-11-25: 50 mL via INTRAVENOUS
  Filled 2020-11-25: qty 50

## 2020-11-25 MED ORDER — THIAMINE HCL 100 MG/ML IJ SOLN
500.0000 mg | Freq: Three times a day (TID) | INTRAVENOUS | Status: AC
  Administered 2020-11-25 – 2020-11-28 (×9): 500 mg via INTRAVENOUS
  Filled 2020-11-25 (×12): qty 5

## 2020-11-25 MED ORDER — LACTATED RINGERS IV SOLN: INTRAVENOUS | Status: DC

## 2020-11-25 MED ORDER — ADULT MULTIVITAMIN W/MINERALS CH
1.0000 | ORAL_TABLET | Freq: Every day | ORAL | Status: DC
  Administered 2020-11-26 – 2020-12-03 (×8): 1 via ORAL
  Filled 2020-11-25 (×9): qty 1

## 2020-11-25 MED ORDER — ALBUTEROL SULFATE (2.5 MG/3ML) 0.083% IN NEBU: 2.5000 mg | INHALATION_SOLUTION | Freq: Four times a day (QID) | RESPIRATORY_TRACT | Status: DC | PRN

## 2020-11-25 MED ORDER — DEXTROSE 10 % IV SOLN: INTRAVENOUS | Status: DC

## 2020-11-25 MED ORDER — CLOPIDOGREL BISULFATE 75 MG PO TABS
75.0000 mg | ORAL_TABLET | Freq: Every day | ORAL | Status: DC
  Administered 2020-11-25 – 2020-12-03 (×9): 75 mg via ORAL
  Filled 2020-11-25 (×9): qty 1

## 2020-11-25 MED ORDER — MIRTAZAPINE 15 MG PO TBDP
7.5000 mg | ORAL_TABLET | Freq: Every day | ORAL | Status: DC
  Administered 2020-11-25 – 2020-12-02 (×8): 7.5 mg via ORAL
  Filled 2020-11-25 (×10): qty 0.5

## 2020-11-25 MED ORDER — LACTATED RINGERS IV BOLUS
250.0000 mL | Freq: Once | INTRAVENOUS | Status: AC
  Administered 2020-11-25: 250 mL via INTRAVENOUS

## 2020-11-25 MED ORDER — DEXTROSE 50 % IV SOLN
INTRAVENOUS | Status: AC
  Filled 2020-11-25: qty 50

## 2020-11-25 MED ORDER — ACETAMINOPHEN 325 MG PO TABS
650.0000 mg | ORAL_TABLET | Freq: Four times a day (QID) | ORAL | Status: DC | PRN
  Administered 2020-11-25 – 2020-11-30 (×3): 650 mg via ORAL
  Filled 2020-11-25 (×3): qty 2

## 2020-11-25 MED ORDER — COSYNTROPIN 0.25 MG IJ SOLR
0.2500 mg | Freq: Once | INTRAMUSCULAR | Status: AC
  Administered 2020-11-26: 0.25 mg via INTRAVENOUS
  Filled 2020-11-25: qty 0.25

## 2020-11-25 MED ORDER — THIAMINE HCL 100 MG/ML IJ SOLN
100.0000 mg | Freq: Every day | INTRAMUSCULAR | Status: DC
  Administered 2020-11-29: 100 mg via INTRAVENOUS
  Filled 2020-11-25: qty 2

## 2020-11-25 MED ORDER — IPRATROPIUM-ALBUTEROL 0.5-2.5 (3) MG/3ML IN SOLN
3.0000 mL | Freq: Four times a day (QID) | RESPIRATORY_TRACT | Status: DC
  Administered 2020-11-25: 3 mL via RESPIRATORY_TRACT
  Filled 2020-11-25: qty 3

## 2020-11-25 MED ORDER — SODIUM BICARBONATE 8.4 % IV SOLN
INTRAVENOUS | Status: DC
  Filled 2020-11-25: qty 1000

## 2020-11-25 NOTE — ED Notes (Addendum)
Pt passed swallow screen at this time. Pt will need to be fed for any meals. Small pills given whole via applesauce. Big pills will have to be crushed.

## 2020-11-25 NOTE — Progress Notes (Signed)
Inpatient Diabetes Program Recommendations  AACE/ADA: New Consensus Statement on Inpatient Glycemic Control (2015)  Target Ranges:  Prepandial:   less than 140 mg/dL      Peak postprandial:   less than 180 mg/dL (1-2 hours)      Critically ill patients:  140 - 180 mg/dL   Lab Results  Component Value Date   GLUCAP 272 (H) 11/25/2020   HGBA1C 7.7 (H) 10/24/2020    Review of Glycemic Control Results for Christine Benitez, Christine Benitez (MRN [id number]) as of 11/25/2020 08:37  Ref. Range 11/25/2020 03:49 11/25/2020 05:03 11/25/2020 06:54 11/25/2020 07:23 11/25/2020 08:01  Glucose-Capillary Latest Ref Range: 70 - 99 mg/dL 491 (H) 86 19 (LL) 791 (H) 272 (H)   Diabetes history: DM Type1? Type 2? Outpatient Diabetes medications: Lantus 10 units BID, Novolog 8 units TID Current orders for Inpatient glycemic control: None  Received consult for insulin education.  In ED with severe hypoglycemia at home.  Attempted to speak with her at bedside.  She is not oriented at this time.  Will attempt to speak with her on 7/20 if appropriate.    Will continue to follow while inpatient.  Thank you, Dulce Sellar, RN, BSN Diabetes Coordinator Inpatient Diabetes Program [phone] (team pager from 8a-5p)

## 2020-11-25 NOTE — Progress Notes (Signed)
  Patient transferred to inpatient bed with no complaints. VS taken and bath performed by NT's. Patient stated that she was legally death and blind making the admission difficult to complete. No family numbers listed. Will continue to gather the information needed to complete admission.

## 2020-11-25 NOTE — Progress Notes (Addendum)
PROGRESS NOTE    REATHA SUR  BZJ:[id number] DOB: 17-Mar-1961 DOA: 11/24/2020 PCP: Lupita Dawn, MD    Chief Complaint  Patient presents with   Hypoglycemia    Brief Narrative: Per H&P  Christine Benitez is Chavie Kolinski 60 y.o. female with medical history significant for type 1? Type 2? diabetes, prior CVA, nonischemic cardiomyopathy, pulmonary hypertension, hypertension, who presented to [facility] ED from home via EMS due to unresponsiveness and severe hypoglycemia with CBG 26.  History is mainly obtained from patient's mother who is also her medical power of attorney via phone.  The patient lives with her adult daughter, and Minie Roadcap nephew who is visiting and assisting with her care.  Around 11:30 AM on 11/24/20 her mother went to visit her at home she would not wake up.  Her mother left.  She called to find out how the patient was doing.  She was told that the patient was still lethargic and was becoming unresponsive.  Her daughter called 911 and per the patient's mother was told to do CPR/chest compressions until EMS arrives.  Unclear how long she had chest compressions.  Upon EMS arrival the patient was severely hypoglycemic with CBG of 26.  She received 125 cc of D10 with improvement of CBG 202.  Not sure if they had informed EMS of the CPR being performed.  Patient was brought to the ED for further evaluation.  She presented with an I.O. access in her right leg, she was confused and pulling at lines, soft restraints were placed in upper extremities bilaterally for patient's safety.  Initial O2 saturation 65% on room air.  On nonrebreather she went up to 96%.  She was weaned down to 4 L.  Work-up in the ED revealed right-sided pneumonia.  Per her mother, she was fine prior to this event.  Suspected that she took too much insulin.  Unclear if she administered the insulin herself or with assistance.  TRH, hospitalist team, was asked to admit.   She's been admitted with sepsis due to pneumonia and  hypoglycemia.   Assessment & Plan:   Active Problems:   Severe sepsis (HCC)  Goals of Care  Failure to thrive Chevonne Bostrom prior hospitalizations seen by palliative with discussion of hospice Will continue to follow PO intake, response to treatment Consider involvement of palliative if not responding, improving Continue marinol, remeron   Sepsis 2/2 Community Acquired Pneumonia Has risk factors for MDRO - recent hospital stay, follow MRSA/sputum cx Given AMS with hypoglycemia, aspiration possible as well Continue ceftriaxone, azithromycin for now, broaden prn CT showing multifocal right lung pneumonia Blood and urine cultures pending Urine strep, legionella, MRSA PCR, sputum Strict I/O, daily weights Trend lactate (5), VBG Continue IVF  Hypoglycemia  T2DM (H&P notes ?type1 or type2, previous notes from prior hospital stay notes type 2 from what I reviewed) Recent discharge 7/12 for similar issue it seems, thought 2/2 inadequate oral intake Cortisol on 7/7 3.2, does not r/o adrenal insufficiency Follow cortisol and acth stim test  Requiring D10 infusion and prn D50 amps Holding insulin at this time Follow  Lactic Acidosis  NAGMA Follow with IVF  Nonischemic Cardiomyopathy 10/2020 with EF 50-55%, no RWMA, grade 1 diastolic dysfunction (improved from 03/2020 - EF 20-25%) Continue corlanor Follow volume status, attn to IVF  Hx CVA Hx L sided weakness and cognitive impairment DAPT, lipitor  Acute Metabolic Encephalopathy Delirium precautions 2/2 above, follow  Thiamine  Prolonged Qtc Improved, follow  DVT prophylaxis: SCD Code Status: DNR  Family Communication: none at bedside- mother over phone Disposition:   Status is: Inpatient  Remains inpatient appropriate because:Inpatient level of care appropriate due to severity of illness  Dispo: The patient is from: Home              Anticipated d/c is to: Home              Patient currently is not medically stable to  d/c.   Difficult to place patient No       Consultants:  none  Procedures:  none  Antimicrobials: Anti-infectives (From admission, onward)    Start     Dose/Rate Route Frequency Ordered Stop   11/24/20 2200  cefTRIAXone (ROCEPHIN) 2 g in sodium chloride 0.9 % 100 mL IVPB        2 g 200 mL/hr over 30 Minutes Intravenous Every 24 hours 11/24/20 2146     11/24/20 2200  azithromycin (ZITHROMAX) 500 mg in sodium chloride 0.9 % 250 mL IVPB        500 mg 250 mL/hr over 60 Minutes Intravenous Every 24 hours 11/24/20 2146            Subjective: Asks for another blanket Can't say why she's here, but [facility]  Objective: Vitals:   11/25/20 0600 11/25/20 0700 11/25/20 0715 11/25/20 0730  BP: 132/82 129/79 132/76 128/70  Pulse: (!) 111 (!) 118 (!) 114 (!) 120  Resp: (!) 21 (!) 21 20 (!) 23  Temp:      TempSrc:      SpO2: 100% 100% 100% 100%  Weight:      Height:        Intake/Output Summary (Last 24 hours) at 11/25/2020 0926 Last data filed at 11/25/2020 0807 Gross per 24 hour  Intake --  Output 850 ml  Net -850 ml   Filed Weights   11/24/20 2356  Weight: 49 kg    Examination:  General exam: Appears calm and comfortable  Respiratory system: Clear to auscultation. Respiratory effort normal. Cardiovascular system: S1 & S2 heard, RRR. Gastrointestinal system: Abdomen is nondistended, soft and nontender.  Central nervous system: lethargic and disoriented, awakens to voice - knows at [facility], but not why she's here. Moving all extremities, no appreciable deficits.  Extremities: no LEE Skin: No rashes, lesions or ulcers Psychiatry: Judgement and insight appear normal. Mood & affect appropriate.     Data Reviewed: I have personally reviewed following labs and imaging studies  CBC: Recent Labs  Lab 11/24/20 2119 11/25/20 0128 11/25/20 0153  WBC 11.1*  --   --   NEUTROABS 9.4*  --   --   HGB 10.5* 10.5* 10.5*  HCT 34.6* 31.0* 31.0*  MCV 103.0*   --   --   PLT 343  --   --     Basic Metabolic Panel: Recent Labs  Lab 11/24/20 2119 11/25/20 0128 11/25/20 0153 11/25/20 0229  NA 137 142 141 137  K 3.8 4.6 4.1 4.5  CL 108  --   --  106  CO2 16*  --   --  24  GLUCOSE 84  --   --  320*  BUN 16  --   --  13  CREATININE 1.10*  --   --  1.07*  CALCIUM 9.3  --   --  9.1    GFR: Estimated Creatinine Clearance: 44.3 mL/min (Alacia Rehmann) (by C-G formula based on SCr of 1.07 mg/dL (H)).  Liver Function Tests: Recent Labs  Lab 11/24/20 2119  AST 32  ALT 27  ALKPHOS 64  BILITOT 0.6  PROT 7.0  ALBUMIN 3.6    CBG: Recent Labs  Lab 11/25/20 0503 11/25/20 0654 11/25/20 0723 11/25/20 0801 11/25/20 0844  GLUCAP 86 19* 370* 272* 202*     Recent Results (from the past 240 hour(s))  Blood culture (routine single)     Status: None (Preliminary result)   Collection Time: 11/24/20  9:46 PM   Specimen: BLOOD  Result Value Ref Range Status   Specimen Description BLOOD SITE NOT SPECIFIED  Final   Special Requests   Final    BOTTLES DRAWN AEROBIC AND ANAEROBIC Blood Culture adequate volume   Culture   Final    NO GROWTH < 12 HOURS Performed at [facility], [address], [place], [place] [address]    Report Status PENDING  Incomplete  Culture, blood (single)     Status: None (Preliminary result)   Collection Time: 11/24/20  9:55 PM   Specimen: BLOOD  Result Value Ref Range Status   Specimen Description BLOOD SITE NOT SPECIFIED  Final   Special Requests   Final    BOTTLES DRAWN AEROBIC AND ANAEROBIC Blood Culture adequate volume   Culture   Final    NO GROWTH < 12 HOURS Performed at [facility], [address] [place], [place] [address]    Report Status PENDING  Incomplete         Radiology Studies: CT HEAD WO CONTRAST  Result Date: 11/25/2020 CLINICAL DATA:  Delirium EXAM: CT HEAD WITHOUT CONTRAST TECHNIQUE: Contiguous axial images were obtained from the base of the skull through the vertex without  intravenous contrast. COMPARISON:  10/24/2020 FINDINGS: Brain: There is no mass, hemorrhage or extra-axial collection. There is generalized atrophy without lobar predilection. Hypodensity of the white matter is most commonly associated with chronic microvascular disease. Vascular: No abnormal hyperdensity of the major intracranial arteries or dural venous sinuses. No intracranial atherosclerosis. Skull: The visualized skull base, calvarium and extracranial soft tissues are normal. Sinuses/Orbits: No fluid levels or advanced mucosal thickening of the visualized paranasal sinuses. No mastoid or middle ear effusion. The orbits are normal. IMPRESSION: Generalized atrophy and chronic microvascular ischemia without acute intracranial abnormality. Electronically Signed   By: Deatra Robinson M.D.   On: 11/25/2020 03:53   DG Chest Port 1 View  Result Date: 11/24/2020 CLINICAL DATA:  Sepsis EXAM: PORTABLE CHEST 1 VIEW COMPARISON:  10/24/2020 FINDINGS: There is increased opacity within the right lung. No pleural effusion or pneumothorax. Normal cardiomediastinal contours. IMPRESSION: Right lung opacities, likely pneumonia. Electronically Signed   By: Deatra Robinson M.D.   On: 11/24/2020 21:40   CT CHEST ABDOMEN PELVIS WO CONTRAST  Result Date: 11/25/2020 CLINICAL DATA:  Respiratory failure EXAM: CT CHEST, ABDOMEN AND PELVIS WITHOUT CONTRAST TECHNIQUE: Multidetector CT imaging of the chest, abdomen and pelvis was performed following the standard protocol without IV contrast. COMPARISON:  Chest radiograph dated 11/24/2020. CTA chest dated 06/09/2014. FINDINGS: CT CHEST FINDINGS Cardiovascular: Heart is normal in size.  No pericardial effusion. No evidence of thoracic aortic aneurysm. Mild atherosclerotic calcifications of the descending thoracic aorta. Three vessel coronary atherosclerosis. Mediastinum/Nodes: No suspicious mediastinal lymphadenopathy. Visualized thyroid is unremarkable. Lungs/Pleura: Multifocal patchy  opacities predominantly in the posterior right upper lobe and posterior right lower lobe, with additional tree-in-bud nodularity throughout the right lung, compatible with multifocal pneumonia. Mild patchy opacity in the posterior left lower lobe, possibly atelectasis. Mild centrilobular and paraseptal  emphysematous changes, upper lung predominant. No pleural effusion or pneumothorax. Musculoskeletal: Visualized osseous structures are within normal limits. CT ABDOMEN PELVIS FINDINGS Hepatobiliary: Unenhanced liver is unremarkable. Gallbladder is unremarkable. No intrahepatic or extrahepatic ductal dilatation. Pancreas: Within normal limits. Spleen: Within normal limits. Adrenals/Urinary Tract: Adrenal glands are within normal limits. Left renal atrophy. Right kidney is within normal limits. No renal calculi or hydronephrosis. Bladder is within normal limits. Stomach/Bowel: Stomach is within normal limits. No evidence of bowel obstruction. Appendix is not discretely visualized. Vascular/Lymphatic: No evidence of abdominal aortic aneurysm. Atherosclerotic calcifications of the abdominal aorta and branch vessels. No suspicious abdominopelvic lymphadenopathy. Reproductive: Uterus is within normal limits. No adnexal masses. Other: No abdominopelvic ascites. Musculoskeletal: Visualized osseous structures are within normal limits. IMPRESSION: Multifocal right lung pneumonia. Mild left renal atrophy. No acute findings in the abdomen/pelvis. Electronically Signed   By: Charline Bills M.D.   On: 11/25/2020 03:54        Scheduled Meds:  aspirin EC  81 mg Oral Daily   clopidogrel  75 mg Oral Daily   [START ON 11/26/2020] cosyntropin  0.25 mg Intravenous Once   dronabinol  5 mg Oral BID AC   feeding supplement  237 mL Oral BID BM   ipratropium-albuterol  3 mL Nebulization Q6H   ivabradine  2.5 mg Oral BID WC   mirtazapine  7.5 mg Oral QHS   multivitamin with minerals  1 tablet Oral Daily   Continuous  Infusions:  azithromycin Stopped (11/24/20 2345)   cefTRIAXone (ROCEPHIN)  IV Stopped (11/24/20 2242)   dextrose 60 mL/hr at 11/25/20 0720     LOS: 0 days    Time spent: over 30 min 35 min critical care, recurrent hypoglycemia, sepsis   Lacretia Nicks, MD [facility]   To contact the attending provider between 7A-7P or the covering provider during after hours 7P-7A, please log into the web site [email or web address] and access using universal [place] password for that web site. If you do not have the password, please call the hospital operator.  11/25/2020, 9:26 AM

## 2020-11-25 NOTE — ED Notes (Signed)
Pt drank about 41mL of OJ and ate about 1/3 of a 4Oz applesauce.

## 2020-11-25 NOTE — H&P (Signed)
History and Physical  Christine Benitez WQN:[id number] DOB: 1961/02/27 DOA: 11/24/2020  Referring physician: Dr. Ronette Deter, EDP  PCP: Lupita Dawn, MD  Outpatient Specialists: Cardiology Patient coming from: Home.  Chief Complaint: Unresponsiveness, severe hypoglycemia.  HPI: Christine Benitez is a 60 y.o. female with medical history significant for type 1? Type 2? diabetes, prior CVA, nonischemic cardiomyopathy, pulmonary hypertension, hypertension, who presented to [facility] ED from home via EMS due to unresponsiveness and severe hypoglycemia with CBG 26.  History is mainly obtained from patient's mother who is also her medical power of attorney via phone.  The patient lives with her adult daughter, and a nephew who is visiting and assisting with her care.  Around 11:30 AM on 11/24/20 her mother went to visit her at home she would not wake up.  Her mother left.  She called to find out how the patient was doing.  She was told that the patient was still lethargic and was becoming unresponsive.  Her daughter called 911 and per the patient's mother was told to do CPR/chest compressions until EMS arrives.  Unclear how long she had chest compressions.  Upon EMS arrival the patient was severely hypoglycemic with CBG of 26.  She received 125 cc of D10 with improvement of CBG 202.  Not sure if they had informed EMS of the CPR being performed.  Patient was brought to the ED for further evaluation.  She presented with an I.O. access in her right leg, she was confused and pulling at lines, soft restraints were placed in upper extremities bilaterally for patient's safety.  Initial O2 saturation 65% on room air.  On nonrebreather she went up to 96%.  She was weaned down to 4 L.  Work-up in the ED revealed right-sided pneumonia.  Per her mother, she was fine prior to this event.  Suspected that she took too much insulin.  Unclear if she administered the insulin herself or with assistance.  TRH, hospitalist team, was asked to  admit.  ED Course:  Temperature 97%.  BP 135/86, pulse 123, respiratory rate 23, O2 saturation 100% on 3 L.  Lab studies remarkable for serum sodium 137, potassium 3.8, serum bicarb 16, anion gap 13, creatinine 1.10, GFR 58.  Lactic acid 4.0, repeated 5.0.  WBC 11.1, hemoglobin 10.5, MCV 103, platelet count 343.  Review of Systems: Review of systems as noted in the HPI. All other systems reviewed and are negative.   Past Medical History:  Diagnosis Date   Abnormal LFTs    a. 05/2014 -  ALT 52, alk phos 130.   Chronic systolic CHF (congestive heart failure) (HCC)    a. Dx 05/2014 - echo at [facility] - EF 30-35%, moderate LVH, no rWMA, mild to moderate MR, moderate PH with PA pressure 60-41mmHg, mild to moderate pericardial effusion. EF 30-35% by cath.   History of blood transfusion 1986   "related to C-section"   Hypertension    Insulin dependent diabetes mellitus    Iron deficiency anemia    NICM (nonischemic cardiomyopathy) (HCC)    a. LHC 06/11/14:  minor nonobstructive CAD with mild irregularities in the LAD less than 10-20%, LCx with mild disease less than 20%, no significant disease in the RCA, EF 30-35%.   Pericardial effusion    a. Mild-moderate pericardial effusion by echo at [facility].   Pulmonary hypertension (HCC)    a. Moderate PH by echo at [facility].   Tobacco abuse    Past Surgical History:  Procedure Laterality Date  APPENDECTOMY  ~ 2003   CESAREAN SECTION  1986   LEFT HEART CATHETERIZATION WITH CORONARY ANGIOGRAM N/A 06/11/2014   Procedure: LEFT HEART CATHETERIZATION WITH CORONARY ANGIOGRAM;  Surgeon: Peter M Martinique, MD;  Location: [facility] CATH LAB;  Service: Cardiovascular;  Laterality: N/A;    Social History:  reports that she quit smoking about 5 years ago. Her smoking use included cigarettes. She has a 11.00 pack-year smoking history. She has never used smokeless tobacco. She reports that she does not drink alcohol and does not use drugs.   No Known Allergies  Family  History  Problem Relation Age of Onset   Coronary artery disease Mother    Diabetes Mother    Lupus Mother    Stroke Mother    Lung cancer Father       Prior to Admission medications   Medication Sig Start Date End Date Taking? Authorizing Provider  albuterol (VENTOLIN HFA) 108 (90 Base) MCG/ACT inhaler Inhale 2 puffs into the lungs every 6 (six) hours as needed for wheezing or shortness of breath. 10/28/20   Allie Bossier, MD  aspirin 81 MG EC tablet TAKE 1 TABLET (81 MG TOTAL) BY MOUTH DAILY. 09/10/14   Martinique, Peter M, MD  atorvastatin (LIPITOR) 40 MG tablet Take 1 tablet (40 mg total) by mouth every evening. 03/26/20   Sanjuan Dame, MD  clopidogrel (PLAVIX) 75 MG tablet Take 1 tablet (75 mg total) by mouth daily. 10/28/20 10/28/21  Allie Bossier, MD  dronabinol (MARINOL) 5 MG capsule Take 1 capsule (5 mg total) by mouth 2 (two) times daily before lunch and supper. 10/28/20   Allie Bossier, MD  feeding supplement (ENSURE ENLIVE / ENSURE PLUS) LIQD Take 237 mLs by mouth 2 (two) times daily between meals. 11/06/20   Hosie Poisson, MD  feeding supplement (ENSURE ENLIVE / ENSURE PLUS) LIQD Take 237 mLs by mouth 3 (three) times daily between meals. 11/18/20   Nicole Kindred A, DO  insulin aspart (NOVOLOG FLEXPEN) 100 UNIT/ML FlexPen Inject 8 Units into the skin 3 (three) times daily with meals. 11/18/20   Ezekiel Slocumb, DO  insulin glargine (LANTUS) 100 UNIT/ML Solostar Pen Inject 10 Units into the skin 2 (two) times daily. 11/18/20   Ezekiel Slocumb, DO  Insulin Pen Needle 32G X 4 MM MISC use as directed 10/28/20   Allie Bossier, MD  ivabradine (CORLANOR) 5 MG TABS tablet Take 0.5 tablets (2.5 mg total) by mouth 2 (two) times daily with a meal. 10/16/20   Lyda Jester M, PA-C  mirtazapine (REMERON SOL-TAB) 15 MG disintegrating tablet Take 0.5 tablets (7.5 mg total) by mouth at bedtime. 03/26/20   Sanjuan Dame, MD  Multiple Vitamin (MULTIVITAMIN WITH MINERALS) TABS tablet Take  1 tablet by mouth daily. 11/07/20   Hosie Poisson, MD  sodium zirconium cyclosilicate (LOKELMA) 10 g PACK packet Take 10 g by mouth 2 (two) times daily. 11/18/20 12/18/20  Ezekiel Slocumb, DO    Physical Exam: BP 135/86 (BP Location: Left Arm)   Pulse (!) 123   Temp (!) 97 F (36.1 C) (Axillary)   Resp (!) 23   Ht $R'5\' 4"'Bg$  (1.626 m)   Wt 49 kg   SpO2 100%   BMI 18.54 kg/m   General: 60 y.o. year-old female well developed well nourished in no acute distress.  Lethargic, arouses, then falls back to sleep. Cardiovascular: Regular rate and rhythm with no rubs or gallops.  No thyromegaly or JVD noted.  No lower  extremity edema. 2/4 pulses in all 4 extremities. Respiratory: Rales at bases with no wheezing noted.  Poor inspiratory effort. Abdomen: Soft nontender nondistended with normal bowel sounds x4 quadrants. Muskuloskeletal: No cyanosis, clubbing or edema noted bilaterally Neuro: CN II-XII intact, strength, sensation, reflexes Skin: No ulcerative lesions noted or rashes Psychiatry: Judgement and insight appear altered.  Unable to assess mood due to lethargy.         Labs on Admission:  Basic Metabolic Panel: Recent Labs  Lab 11/24/20 2119 11/25/20 0128 11/25/20 0153 11/25/20 0229  NA 137 142 141 137  K 3.8 4.6 4.1 4.5  CL 108  --   --  106  CO2 16*  --   --  24  GLUCOSE 84  --   --  320*  BUN 16  --   --  13  CREATININE 1.10*  --   --  1.07*  CALCIUM 9.3  --   --  9.1   Liver Function Tests: Recent Labs  Lab 11/24/20 2119  AST 32  ALT 27  ALKPHOS 64  BILITOT 0.6  PROT 7.0  ALBUMIN 3.6   No results for input(s): LIPASE, AMYLASE in the last 168 hours. Recent Labs  Lab 11/24/20 2157  AMMONIA 22   CBC: Recent Labs  Lab 11/24/20 2119 11/25/20 0128 11/25/20 0153  WBC 11.1*  --   --   NEUTROABS 9.4*  --   --   HGB 10.5* 10.5* 10.5*  HCT 34.6* 31.0* 31.0*  MCV 103.0*  --   --   PLT 343  --   --    Cardiac Enzymes: No results for input(s): CKTOTAL, CKMB,  CKMBINDEX, TROPONINI in the last 168 hours.  BNP (last 3 results) No results for input(s): BNP in the last 8760 hours.  ProBNP (last 3 results) No results for input(s): PROBNP in the last 8760 hours.  CBG: Recent Labs  Lab 11/25/20 0129 11/25/20 0204 11/25/20 0248 11/25/20 0349 11/25/20 0503  GLUCAP 53* 262* 154* 118* 86    Radiological Exams on Admission: CT HEAD WO CONTRAST  Result Date: 11/25/2020 CLINICAL DATA:  Delirium EXAM: CT HEAD WITHOUT CONTRAST TECHNIQUE: Contiguous axial images were obtained from the base of the skull through the vertex without intravenous contrast. COMPARISON:  10/24/2020 FINDINGS: Brain: There is no mass, hemorrhage or extra-axial collection. There is generalized atrophy without lobar predilection. Hypodensity of the white matter is most commonly associated with chronic microvascular disease. Vascular: No abnormal hyperdensity of the major intracranial arteries or dural venous sinuses. No intracranial atherosclerosis. Skull: The visualized skull base, calvarium and extracranial soft tissues are normal. Sinuses/Orbits: No fluid levels or advanced mucosal thickening of the visualized paranasal sinuses. No mastoid or middle ear effusion. The orbits are normal. IMPRESSION: Generalized atrophy and chronic microvascular ischemia without acute intracranial abnormality. Electronically Signed   By: Ulyses Jarred M.D.   On: 11/25/2020 03:53   DG Chest Port 1 View  Result Date: 11/24/2020 CLINICAL DATA:  Sepsis EXAM: PORTABLE CHEST 1 VIEW COMPARISON:  10/24/2020 FINDINGS: There is increased opacity within the right lung. No pleural effusion or pneumothorax. Normal cardiomediastinal contours. IMPRESSION: Right lung opacities, likely pneumonia. Electronically Signed   By: Ulyses Jarred M.D.   On: 11/24/2020 21:40   CT CHEST ABDOMEN PELVIS WO CONTRAST  Result Date: 11/25/2020 CLINICAL DATA:  Respiratory failure EXAM: CT CHEST, ABDOMEN AND PELVIS WITHOUT CONTRAST  TECHNIQUE: Multidetector CT imaging of the chest, abdomen and pelvis was performed following the standard protocol without  IV contrast. COMPARISON:  Chest radiograph dated 11/24/2020. CTA chest dated 06/09/2014. FINDINGS: CT CHEST FINDINGS Cardiovascular: Heart is normal in size.  No pericardial effusion. No evidence of thoracic aortic aneurysm. Mild atherosclerotic calcifications of the descending thoracic aorta. Three vessel coronary atherosclerosis. Mediastinum/Nodes: No suspicious mediastinal lymphadenopathy. Visualized thyroid is unremarkable. Lungs/Pleura: Multifocal patchy opacities predominantly in the posterior right upper lobe and posterior right lower lobe, with additional tree-in-bud nodularity throughout the right lung, compatible with multifocal pneumonia. Mild patchy opacity in the posterior left lower lobe, possibly atelectasis. Mild centrilobular and paraseptal emphysematous changes, upper lung predominant. No pleural effusion or pneumothorax. Musculoskeletal: Visualized osseous structures are within normal limits. CT ABDOMEN PELVIS FINDINGS Hepatobiliary: Unenhanced liver is unremarkable. Gallbladder is unremarkable. No intrahepatic or extrahepatic ductal dilatation. Pancreas: Within normal limits. Spleen: Within normal limits. Adrenals/Urinary Tract: Adrenal glands are within normal limits. Left renal atrophy. Right kidney is within normal limits. No renal calculi or hydronephrosis. Bladder is within normal limits. Stomach/Bowel: Stomach is within normal limits. No evidence of bowel obstruction. Appendix is not discretely visualized. Vascular/Lymphatic: No evidence of abdominal aortic aneurysm. Atherosclerotic calcifications of the abdominal aorta and branch vessels. No suspicious abdominopelvic lymphadenopathy. Reproductive: Uterus is within normal limits. No adnexal masses. Other: No abdominopelvic ascites. Musculoskeletal: Visualized osseous structures are within normal limits. IMPRESSION:  Multifocal right lung pneumonia. Mild left renal atrophy. No acute findings in the abdomen/pelvis. Electronically Signed   By: Julian Hy M.D.   On: 11/25/2020 03:54    EKG: I independently viewed the EKG done and my findings are as followed: Ectopic ultrasound tachycardia rate of 141, nonspecific ST-T changes, QTC 564.  Assessment/Plan Present on Admission:  Severe sepsis ([place])  Active Problems:   Severe sepsis (HCC)  Severe sepsis secondary to right-sided pneumonia Presented with lactic acidosis, 5, tachycardia, tachypnea, infiltrates on chest x-ray. Follow blood cultures and sputum culture Started on Rocephin and azithromycin in the ED, continue. Bronchodilators Mobilize as tolerated with PT OT assistance and fall precautions Monitor fever curve and WBC Repeat CBC in the morning Obtain procalcitonin level  Acute hypoxic respiratory failure secondary to right-sided pneumonia None oxygen supplementation at baseline Wean off oxygen supplementation as tolerated Incentive spirometer once more alert Rest of management as stated above  Severe hypoglycemia, suspect iatrogenic Suspect that she may have been taking too much insulin Hold off insulin today She is currently on D10 at 30 cc/h. Encourage oral intake once more alert  Acute metabolic encephalopathy secondary to severe hypoglycemia, sepsis Treat underlying conditions Reorient as needed.  Type 1? Type 2? diabetes with hypoglycemia Last hemoglobin A1c 7.7 on 10/24/2020 Per her mother via phone, She Is Type 1 Diabetes Avoid tight correction of blood sugar, avoid hypoglycemia If type I will need to restart long-acting and correction insulin at lower doses. Diabetes coordinator consult for patient's education  Chronic diastolic CHF Last 2D echo done on 10/25/2020 showed LVEF 50 to 55% with grade 1 diastolic dysfunction LVEF was 20 to 25% on 03/12/2020. Strict I's and O's and daily weight Restart home  ivabradine  Severe protein calorie malnutrition/cachexia BMI 18 Severe muscle mass loss Palliative care team consulted to assist with establishing goals of care  Anion gap metabolic acidosis secondary to lactic acidosis Presented with serum bicarb of 16 with anion gap of 13 Lactic acid 4.0 then 5.0 Received bicarb infusion Now resolved  Lactic acidosis, sepsis related Continue IV fluid hydration Presented with lactic acid 4.0, repeated 5.0. Repeat lactic acid level in the morning Continue  IV fluid  Prolonged QTC Prolonged QTC prolonging agents Optimize potassium and magnesium levels Repeat twelve-lead EKG in the morning.   DVT prophylaxis: Subcu Lovenox daily  Code Status: DNR per her mother who is her medical power of attorney via phone.  Family Communication: Updated her mother via phone.  Disposition Plan: Admit to progressive unit  Consults called: None  Admission status: Inpatient status.  Patient will require least 2 midnight for further evaluation and treatment of present condition.   Status is: Inpatient    Dispo: The patient is from: Home.               Anticipated d/c is to: Home.               Patient currently not stable for discharge due to ongoing management of severe hypoglycemia, acute metabolic encephalopathy, right-sided pneumonia.    Difficult to place patient, not applicable.       Kayleen Memos MD [facility] Pager [phone]  If 7PM-7AM, please contact night-coverage [email or web address] Password [facility]  11/25/2020, 5:46 AM

## 2020-11-25 NOTE — ED Notes (Signed)
Dextrose 50 1amp given IVP.

## 2020-11-25 NOTE — Plan of Care (Signed)
  Problem: Education: Goal: Ability to describe self-care measures that may prevent or decrease complications (Diabetes Survival Skills Education) will improve Outcome: Not Progressing Goal: Individualized Educational Video(s) Outcome: Not Progressing   Problem: Nutritional: Goal: Maintenance of adequate nutrition will improve Outcome: Not Progressing Goal: Progress toward achieving an optimal weight will improve Outcome: Not Progressing

## 2020-11-25 NOTE — ED Notes (Signed)
MD at bedside. 

## 2020-11-26 ENCOUNTER — Inpatient Hospital Stay (HOSPITAL_COMMUNITY): Payer: Medicaid Other

## 2020-11-26 ENCOUNTER — Encounter (HOSPITAL_COMMUNITY): Payer: Self-pay | Admitting: Internal Medicine

## 2020-11-26 DIAGNOSIS — J9601 Acute respiratory failure with hypoxia: Secondary | ICD-10-CM

## 2020-11-26 DIAGNOSIS — G9340 Encephalopathy, unspecified: Secondary | ICD-10-CM

## 2020-11-26 DIAGNOSIS — Z7189 Other specified counseling: Secondary | ICD-10-CM

## 2020-11-26 DIAGNOSIS — Z515 Encounter for palliative care: Secondary | ICD-10-CM

## 2020-11-26 DIAGNOSIS — E162 Hypoglycemia, unspecified: Secondary | ICD-10-CM

## 2020-11-26 LAB — GLUCOSE, CAPILLARY
Glucose-Capillary: 151 mg/dL — ABNORMAL HIGH (ref 70–99)
Glucose-Capillary: 153 mg/dL — ABNORMAL HIGH (ref 70–99)
Glucose-Capillary: 190 mg/dL — ABNORMAL HIGH (ref 70–99)
Glucose-Capillary: 200 mg/dL — ABNORMAL HIGH (ref 70–99)
Glucose-Capillary: 236 mg/dL — ABNORMAL HIGH (ref 70–99)
Glucose-Capillary: 26 mg/dL — CL (ref 70–99)
Glucose-Capillary: 340 mg/dL — ABNORMAL HIGH (ref 70–99)
Glucose-Capillary: 355 mg/dL — ABNORMAL HIGH (ref 70–99)
Glucose-Capillary: 371 mg/dL — ABNORMAL HIGH (ref 70–99)
Glucose-Capillary: 404 mg/dL — ABNORMAL HIGH (ref 70–99)
Glucose-Capillary: 405 mg/dL — ABNORMAL HIGH (ref 70–99)
Glucose-Capillary: 65 mg/dL — ABNORMAL LOW (ref 70–99)
Glucose-Capillary: 85 mg/dL (ref 70–99)

## 2020-11-26 LAB — LACTIC ACID, PLASMA: Lactic Acid, Venous: 1.9 mmol/L (ref 0.5–1.9)

## 2020-11-26 LAB — CBC
HCT: 27.3 % — ABNORMAL LOW (ref 36.0–46.0)
Hemoglobin: 8.8 g/dL — ABNORMAL LOW (ref 12.0–15.0)
MCH: 31.8 pg (ref 26.0–34.0)
MCHC: 32.2 g/dL (ref 30.0–36.0)
MCV: 98.6 fL (ref 80.0–100.0)
Platelets: 242 10*3/uL (ref 150–400)
RBC: 2.77 MIL/uL — ABNORMAL LOW (ref 3.87–5.11)
RDW: 14.3 % (ref 11.5–15.5)
WBC: 14 10*3/uL — ABNORMAL HIGH (ref 4.0–10.5)
nRBC: 0 % (ref 0.0–0.2)

## 2020-11-26 LAB — ACTH STIMULATION, 3 TIME POINTS
Cortisol, 30 Min: 21.1 ug/dL
Cortisol, 60 Min: 26.1 ug/dL
Cortisol, Base: 4.7 ug/dL

## 2020-11-26 LAB — BASIC METABOLIC PANEL
Anion gap: 9 (ref 5–15)
BUN: 11 mg/dL (ref 6–20)
CO2: 27 mmol/L (ref 22–32)
Calcium: 9.2 mg/dL (ref 8.9–10.3)
Chloride: 104 mmol/L (ref 98–111)
Creatinine, Ser: 1.13 mg/dL — ABNORMAL HIGH (ref 0.44–1.00)
GFR, Estimated: 56 mL/min — ABNORMAL LOW (ref >60)
Glucose, Bld: 125 mg/dL — ABNORMAL HIGH (ref 70–99)
Potassium: 4.4 mmol/L (ref 3.5–5.1)
Sodium: 140 mmol/L (ref 135–145)

## 2020-11-26 LAB — URINE CULTURE: Culture: 10000 — AB

## 2020-11-26 LAB — PROCALCITONIN: Procalcitonin: 7.51 ng/mL

## 2020-11-26 MED ORDER — DEXTROSE 50 % IV SOLN
INTRAVENOUS | Status: AC
  Administered 2020-11-26: 50 mL
  Filled 2020-11-26: qty 50

## 2020-11-26 MED ORDER — ENOXAPARIN SODIUM 40 MG/0.4ML IJ SOSY
40.0000 mg | PREFILLED_SYRINGE | INTRAMUSCULAR | Status: DC
  Administered 2020-11-26 – 2020-12-03 (×8): 40 mg via SUBCUTANEOUS
  Filled 2020-11-26 (×8): qty 0.4

## 2020-11-26 MED ORDER — ENSURE ENLIVE PO LIQD
237.0000 mL | Freq: Three times a day (TID) | ORAL | Status: DC
  Administered 2020-11-26 – 2020-12-02 (×17): 237 mL via ORAL

## 2020-11-26 MED ORDER — DEXTROSE-NACL 5-0.45 % IV SOLN: INTRAVENOUS | Status: DC

## 2020-11-26 MED ORDER — DEXTROSE 50 % IV SOLN
1.0000 | Freq: Once | INTRAVENOUS | Status: AC
  Administered 2020-11-26: 50 mL via INTRAVENOUS
  Filled 2020-11-26: qty 50

## 2020-11-26 NOTE — Progress Notes (Signed)
Initial Nutrition Assessment  DOCUMENTATION CODES:   Underweight, Severe malnutrition in context of chronic illness  INTERVENTION:   -Ensure Enlive po TID, each supplement provides 350 kcal and 20 grams of protein  -MVI with minerals daily -Magic cup TID with meals, each supplement provides 290 kcal and 9 grams of protein  -Liberalize diet to regular  NUTRITION DIAGNOSIS:   Moderate Malnutrition related to chronic illness (CHF, DM, CVA) as evidenced by mild fat depletion, moderate fat depletion, mild muscle depletion, moderate muscle depletion.  GOAL:   Patient will meet greater than or equal to 90% of their needs  MONITOR:   PO intake, Supplement acceptance, Labs, Weight trends, Skin, I & O's  REASON FOR ASSESSMENT:   Consult Assessment of nutrition requirement/status  ASSESSMENT:   Christine Benitez is a 60 y.o. female with medical history significant for type 1? Type 2? diabetes, prior CVA, nonischemic cardiomyopathy, pulmonary hypertension, hypertension, who presented to [facility] ED from home via EMS due to unresponsiveness and severe hypoglycemia with CBG 26.  Pt admitted with sepsis and pneumonia.   Reviewed I/O's: +557 ml x 24 hours   UOP: 850 ml x 24 hours  Case discussed with nurse tech, who reports that pt refused breakfast this morning due to not being hungry. RN was able to get her to eat some applesauce and grhaam crackers. Noted pt consumed 50% of Ensure Enlive supplement.   Spoke with pt at bedside. Pt is very HOH. She reports that her intake had improved at home after previous hospital discharge. She shares she consumes 3 meals per day ("whatever my daughter cooks for me"). Frequently consumed foods include grits, eggs, and water. Pt reports she likes Ensure supplements.   Reviewed wt hx; wt has been stable over the past 6 months.   Discussed with pt importance of good meal and supplement intake to promote healing.   Pt with poor oral intake and would benefit  from nutrient dense supplement. One Ensure Enlive supplement provides 350 kcals, 20 grams protein, and 44-45 grams of carbohydrate vs one Glucerna shake supplement, which provides 220 kcals, 10 grams of protein, and 26 grams of carbohydrate. Given pt's hx of DM, RD will reassess adequacy of PO intake, CBGS, and adjust supplement regimen as appropriate at follow-up.    Medications reviewed and include marinol, remeron, thiamine, dextrose 5%-0.45% sodium chloride infusion @ 100 ml/hr, and lactated ringers infusion @ 75 ml/hr.   Lab Results  Component Value Date   HGBA1C 7.7 (H) 10/24/2020   PTA DM medications are 10 units insulin glargine BID and 8 units insulin aspart TID.   Labs reviewed: CBGS: 19-340 ( (inpatient orders for glycemic control are none).    NUTRITION - FOCUSED PHYSICAL EXAM:  Flowsheet Row Most Recent Value  Orbital Region Mild depletion  Upper Arm Region Mild depletion  Thoracic and Lumbar Region Mild depletion  Buccal Region Mild depletion  Temple Region Mild depletion  Clavicle Bone Region No depletion  Clavicle and Acromion Bone Region No depletion  Scapular Bone Region No depletion  Dorsal Hand Mild depletion  Patellar Region Moderate depletion  Anterior Thigh Region Moderate depletion  Posterior Calf Region Moderate depletion  Edema (RD Assessment) None  Hair Reviewed  Eyes Reviewed  Mouth Reviewed  Skin Reviewed  Nails Reviewed       Diet Order:   Diet Order             Diet heart healthy/carb modified Room service appropriate? Yes; Fluid consistency: Thin  Diet effective now                   EDUCATION NEEDS:   Education needs have been addressed  Skin:  Skin Assessment: Reviewed RN Assessment  Last BM:  11/25/20  Height:   Ht Readings from Last 1 Encounters:  11/24/20 5\' 4"  (1.626 m)    Weight:   Wt Readings from Last 1 Encounters:  11/26/20 48.3 kg    Ideal Body Weight:  54.5 kg  BMI:  Body mass index is 18.28  kg/m.  Estimated Nutritional Needs:   Kcal:  1500-1700  Protein:  80-95 grams  Fluid:  > 1.5 L    11/28/20, RD, LDN, CDCES Registered Dietitian II Certified Diabetes Care and Education Specialist Please refer to [facility] for RD and/or RD on-call/weekend/after hours pager

## 2020-11-26 NOTE — Progress Notes (Signed)
Redge Gainer Rm 2I20 Civil engineer, contracting [facility]) Hospital Liaison note:  This is a pending outpatient-based Palliative Care patient. Will continue to follow for disposition.  Please call with any outpatient palliative questions or concerns.  Thank you, Abran Cantor, LPN [facility] Liaison [phone]

## 2020-11-26 NOTE — Evaluation (Signed)
Clinical/Bedside Swallow Evaluation Patient Details  Name: Christine Benitez MRN: [id number] Date of Birth: [birth year]  Today's Date: 11/26/2020 Time: SLP Start Time (ACUTE ONLY): 1000 SLP Stop Time (ACUTE ONLY): 1014 SLP Time Calculation (min) (ACUTE ONLY): 14 min  Past Medical History:  Past Medical History:  Diagnosis Date   Abnormal LFTs    a. 05/2014 -  ALT 52, alk phos 130.   Chronic systolic CHF (congestive heart failure) ([place])    a. Dx 05/2014 - echo at [facility] - EF 30-35%, moderate LVH, no rWMA, mild to moderate MR, moderate PH with PA pressure 60-47mHg, mild to moderate pericardial effusion. EF 30-35% by cath.   History of blood transfusion 1986   "related to C-section"   Hypertension    Insulin dependent diabetes mellitus    Iron deficiency anemia    [place]mic [place]    [place]:  minor nonobstructive CAD with mild irregularities in the LAD less than 10-20%, LCx with mild disease less than 20%, no significant disease in the RCA, EF 30-35%.   Pericardial effusion    a. [facility]   Pulmonary hypertension [facility]    [facility]   Tobacco abuse    Past Surgical History:  Past Surgical History:  Procedure Laterality Date   APPENDECTOMY  ~ 2003   [place] ANG[place] N/A 06/11/2014   Procedure: LEFT HEART CATHETERIZATION WITH CORONARY ANGIOGRAM;  Surgeon: Peter M JMartinique MD;  [facility];  Service: Cardiovascular;  Laterality: N/A;   HPI:  Pt is a 60y.o. female who presented to the ED via EMS due to unresponsiveness and severe hypoglycemia. CT chest: Multifocal right lung pneumonia. PMH: diabetes, CVA, nonischemic cardiomyopathy, pulmonary hypertension, hypertension. [place] at which time she was on a dysphgia 2 diet and thin liquids.   Assessment / Plan / Recommendation Clinical  Impression  Pt was seen for bedside swallow evaluation. Oral mechanism exam was limited due to pt's difficulty following commands; however, oral motor strength and ROM [facility]. She demonstrated prolonged mastication secondary to edentulous status and intermittent throat clearing was noted with thin liquids. Considering results of imaging, a modified barium swallow study will be conducted to further assess swallow function. Her current diet will be continued until it is completed. SLP Visit Diagnosis: Dysphagia, unspecified (R13.10)    Aspiration Risk  Mild aspiration risk    Diet Recommendation Thin liquid;Regular   Liquid Administration via: Cup;Straw Medication Administration: Whole meds with liquid Supervision: Staff to assist with self feeding Compensations: Slow rate;Small sips/bites Postural Changes: Seated upright at 90 degrees    Other  Recommendations Oral Care Recommendations: Oral care BID   Follow up Recommendations  (TBD)      Frequency and Duration  (TBD)          Prognosis Prognosis for Safe Diet Advancement: Fair      Swallow Study   General Date of Onset: 11/25/20 HPI: Pt is a 60y.o. female who presented to the ED via EMS due to unresponsiveness and severe hypoglycemia. CT chest: Multifocal right lung pneumonia. PMH: diabetes, CVA, nonischemic cardiomyopathy, pulmonary hypertension, hypertension. [place] at which time she was on a dysphgia 2 diet and thin liquids. Type of Study: Bedside Swallow Evaluation Previous Swallow Assessment: See  HPI Diet Prior to this Study: Regular;Thin liquids Temperature Spikes Noted: No Respiratory Status: Room air History of Recent Intubation: No Behavior/Cognition: Pleasant mood;Cooperative;Confused;Alert;Doesn't follow directions Oral Cavity Assessment: Within Functional Limits Oral Care Completed by SLP: No Oral Cavity - Dentition: Edentulous Vision:  Functional for self-feeding Self-Feeding Abilities: Needs assist Patient Positioning: Upright in bed;Postural control adequate for testing Baseline Vocal Quality: Normal Volitional Cough: Cognitively unable to elicit Volitional Swallow: Unable to elicit    Oral/Motor/Sensory Function     Ice Chips Ice chips: Within functional limits Presentation: Spoon   Thin Liquid Thin Liquid: Impaired Presentation: Straw Pharyngeal  Phase Impairments: Throat Clearing - Immediate    Nectar Thick Nectar Thick Liquid: Not tested   Honey Thick Honey Thick Liquid: Not tested   Puree Puree: Within functional limits Presentation: Spoon   Solid     Solid: Impaired Oral Phase Impairments: Impaired mastication     Morena Mckissack I. Hardin Negus, [place], [place] Office number [phone] Pager [phone]  Horton Marshall 11/26/2020,10:18 AM

## 2020-11-26 NOTE — Progress Notes (Signed)
Inpatient Diabetes Program Recommendations  AACE/ADA: New Consensus Statement on Inpatient Glycemic Control (2015)  Target Ranges:  Prepandial:   less than 140 mg/dL      Peak postprandial:   less than 180 mg/dL (1-2 hours)      Critically ill patients:  140 - 180 mg/dL   Lab Results  Component Value Date   GLUCAP 190 (H) 11/26/2020   HGBA1C 7.7 (H) 10/24/2020    Review of Glycemic Control Results for Christine Benitez, Christine Benitez (MRN [id number]) as of 11/26/2020 10:37  Ref. Range 11/26/2020 05:59 11/26/2020 08:32 11/26/2020 08:39 11/26/2020 09:05  Glucose-Capillary Latest Ref Range: 70 - 99 mg/dL 85 19 (LL) 26 (LL) 270 (H)   Diabetes history: Type 2 dm Outpatient Diabetes medications: Lantus 10 units BID, Novolog 8 units TID Current orders for Inpatient glycemic control: None  Inpatient Diabetes Program Recommendations:    Spoke with RN; patient very hard of hearing and blind. Felt that she would be a good candidate for education.   At bedside, attempted to speak with patient. Patient is not appropriate for education. Confused. Inaudible speech. Will reach out to family.   Spoke with [name], patient's mother. Patient lives with her daughter. Mother is not primary caretaker and is unable to provide care/injections. Education provided on hypoglycemia, frequency, infection and when to call MD.  Encouraged to call [name], patient's daughter.  Called and spoke with patient's daughter [name]. Daughter has been administering Novolog 20 units TID, Lantus 20 units BID. Hesitated and seemed unsure about questions regarding patient's dosages, blood sugars, and primary care office. Reports multiple lows and that she tried contacting Family practice in [place], but "they didn't do anything". When asked where the dosages were referenced from, she responded that she got the dosages from [facility] when the patient was discharged. Cannot remember date, however, per chart review looks to be in Aug of 2021 and do  not match the doses she reported administering.  Patient does not self inject. Daughter performs injections. Expressed concern for differences in dosages given issues with hypoglycemia. Reviewed impact of glucose trends with infection.  Reviewed patient's current A1c of 7.7%. Explained what a A1c is and what it measures. Also reviewed goal A1c with patient, importance of good glucose control @ home, and blood sugar goals.  Stressed the importance of taking insulin as prescribed with most recent dosages from most recent provider. Education provided on anticipated dosages compared to dosages that she reported and risk for mortality with recurrent hypoglycemia. Education provided on discharge summaries, where to locate dosages, current inpatient trends, interventions, and when to call MD. Encouraged face-to-face education to ensure understanding, however daughter unsure if she will be able to be present.  Secure chat sent to MD to inform of caregiver situation in preparation for disposition.  Of note: Looking back at previous hospitalization Novolog 8 units TID seems to be high and would anticipate needing adjustment at discharge.   Thanks, Lujean Rave, MSN, RNC-OB Diabetes Coordinator [phone] (8a-5p)

## 2020-11-26 NOTE — Progress Notes (Signed)
PROGRESS NOTE    TRENELL MOXEY  Christine Benitez:[id number] DOB: 1961-04-30 DOA: 11/24/2020 PCP: Claiborne Billings, MD    Chief Complaint  Patient presents with   Hypoglycemia    Brief Narrative:  Christine Benitez is a 60 y.o. female with medical history significant for type 1? Type 2? diabetes, prior CVA, nonischemic cardiomyopathy, pulmonary hypertension, hypertension, who presented to [facility] ED from home via EMS due to unresponsiveness and severe hypoglycemia with CBG 26.  History is mainly obtained from patient's mother who is also her medical power of attorney via phone.  The patient lives with her adult daughter, and a nephew who is visiting and assisting with her care.  Around 11:30 AM on 11/24/20 her mother went to visit her at home she would not wake up.  Her mother left.  She called to find out how the patient was doing.  She was told that the patient was still lethargic and was becoming unresponsive.  Her daughter called 911 and per the patient's mother was told to do CPR/chest compressions until EMS arrives.  Unclear how long she had chest compressions.  Upon EMS arrival the patient was severely hypoglycemic with CBG of 26.  She received 125 cc of D10 with improvement of CBG 202.  Not sure if they had informed EMS of the CPR being performed.  Patient was brought to the ED for further evaluation.  She presented with an I.O. access in her right leg, she was confused and pulling at lines, soft restraints were placed in upper extremities bilaterally for patient's safety.  Initial O2 saturation 65% on room air.  On nonrebreather she went up to 96%.  She was weaned down to 4 L.  Work-up in the ED revealed right-sided pneumonia.  Per her mother, she was fine prior to this event.  Suspected that she took too much insulin.  Unclear if she administered the insulin herself or with assistance.  TRH, hospitalist team, was asked to admit.   She's been admitted with sepsis due to pneumonia and hypoglycemia.   Assessment &  Plan:   Active Problems:   Severe sepsis (HCC)  Goals of Care  Failure to thrive A prior hospitalizations seen by palliative with discussion of hospice Palliative medicine is following Continue marinol, remeron   Sepsis 2/2 Community Acquired Pneumonia Has risk factors for MDRO - recent hospital stay, follow MRSA/sputum cx Given AMS with hypoglycemia, aspiration possible as well Continue ceftriaxone, azithromycin for now, broaden prn CT showing multifocal right lung pneumonia Blood cultures with no growth to date. Urine strep, legionella, MRSA PCR, sputum Strict I/O, daily weights Continue IVF Lactic acid has normalized, procalcitonin remains elevated at 7.  Hypoglycemia  T2DM (H&P notes ?type1 or type2, previous notes from prior hospital stay notes type 2 from what I reviewed) Recent discharge 7/12 for similar issue it seems, thought 2/2 inadequate oral intake -he had cosyntropin test done this morning with good response 4.7 >> 21>> 26, so there is no evidence of renal insufficiency. -Hypoglycemic this morning despite not receiving any insulin, she required D50 and D5 half-normal saline. -Does appear daughter has been giving her higher dose, due to lack of education, apparently patient been getting 20 units of Lantus twice daily, NovoLog 20 TID, which is much higher than what she is supposed to be taking, she will need educated clearly upon discharge about the appropriate dose.  Lactic Acidosis  NAGMA Continue with IV fluids  Nonischemic Cardiomyopathy 10/2020 with EF 50-55%, no RWMA, grade 1  diastolic dysfunction (improved from 03/2020 - EF 20-25%) Continue corlanor Follow volume status, attn to IVF  Hx CVA Hx L sided weakness and cognitive impairment DAPT, lipitor  Acute Metabolic Encephalopathy Delirium precautions 2/2 above, follow  Thiamine  Prolonged Qtc Improved, follow  DVT prophylaxis: SCD Code Status: DNR Family Communication: none at bedside- daughter   over phone Disposition:   Status is: Inpatient  Remains inpatient appropriate because:Inpatient level of care appropriate due to severity of illness  Dispo: The patient is from: Home              Anticipated d/c is to: Home              Patient currently is not medically stable to d/c.   Difficult to place patient No       Consultants:  none  Procedures:  none  Antimicrobials: Anti-infectives (From admission, onward)    Start     Dose/Rate Route Frequency Ordered Stop   11/24/20 2200  cefTRIAXone (ROCEPHIN) 2 g in sodium chloride 0.9 % 100 mL IVPB        2 g 200 mL/hr over 30 Minutes Intravenous Every 24 hours 11/24/20 2146     11/24/20 2200  azithromycin (ZITHROMAX) 500 mg in sodium chloride 0.9 % 250 mL IVPB        500 mg 250 mL/hr over 60 Minutes Intravenous Every 24 hours 11/24/20 2146            Subjective: Asks for another blanket Can't say why she's here, but knows [facility]  Objective: Vitals:   11/26/20 0300 11/26/20 0500 11/26/20 0824 11/26/20 1316  BP: 108/63  113/72 129/77  Pulse: 99  (!) 101 (!) 108  Resp: _0 Temp: (!) 97.4 F (36.3 C)  99.2 F (37.3 C) 99.7 F (37.6 C)  TempSrc: Axillary  Oral Oral  SpO2: 99%  90% 95%  Weight:  48.3 kg    Height:        Intake/Output Summary (Last 24 hours) at 11/26/2020 1609 Last data filed at 11/26/2020 3953 Gross per 24 hour  Intake 1671.72 ml  Output 400 ml  Net 1271.72 ml   Filed Weights   11/24/20 2356 11/26/20 0500  Weight: 49 kg 48.3 kg    Examination:  Awake, pleasant,  deconditioned and frail.  Oriented x3. Symmetrical Chest wall movement, Good air movement bilaterally, CTAB RRR,No Gallops,Rubs or new Murmurs, No Parasternal Heave +ve B.Sounds, Abd Soft, No tenderness, No rebound - guarding or rigidity. No Cyanosis, Clubbing or edema, No new Rash or bruise      Data Reviewed: I have personally reviewed following labs and imaging studies  CBC: Recent Labs  Lab  11/24/20 2119 11/25/20 0128 11/25/20 0153 11/25/20 0909 11/26/20 0530  WBC 11.1*  --   --   --  14.0*  NEUTROABS 9.4*  --   --   --   --   HGB 10.5* 10.5* 10.5* 10.5* 8.8*  HCT 34.6* 31.0* 31.0* 31.0* 27.3*  MCV 103.0*  --   --   --  98.6  PLT 343  --   --   --  202    Basic Metabolic Panel: Recent Labs  Lab 11/24/20 2119 11/25/20 0128 11/25/20 0229 11/25/20 0853 11/25/20 0909 11/25/20 1520 11/26/20 0530  NA 137   < > 137 137 140 136 140  K 3.8   < > 4.5 4.3 4.3 4.1 4.4  CL 108  --  106  104  --  103 104  CO2 16*  --  24 26  --  21* 27  GLUCOSE 84  --  320* 189*  --  195* 125*  BUN 16  --  13 13  --  10 11  CREATININE 1.10*  --  1.07* 1.05*  --  1.06* 1.13*  CALCIUM 9.3  --  9.1 9.2  --  9.2 9.2   < > = values in this interval not displayed.    GFR: Estimated Creatinine Clearance: 41.4 mL/min (A) (by C-G formula based on SCr of 1.13 mg/dL (H)).  Liver Function Tests: Recent Labs  Lab 11/24/20 2119  AST 32  ALT 27  ALKPHOS 64  BILITOT 0.6  PROT 7.0  ALBUMIN 3.6    CBG: Recent Labs  Lab 11/26/20 0559 11/26/20 0832 11/26/20 0839 11/26/20 0905 11/26/20 1326  GLUCAP 85 19* 26* 190* 340*     Recent Results (from the past 240 hour(s))  Urine Culture     Status: Abnormal   Collection Time: 11/24/20  9:19 PM   Specimen: In/Out Cath Urine  Result Value Ref Range Status   Specimen Description IN/OUT CATH URINE  Final   Special Requests   Final    NONE Performed at [facility], [place] [address] [place], [place] [address]    Culture (A)  Final    10,000 COLONIES/mL MULTIPLE SPECIES PRESENT, SUGGEST RECOLLECTION   Report Status 11/26/2020 FINAL  Final  Blood culture (routine single)     Status: None (Preliminary result)   Collection Time: 11/24/20  9:46 PM   Specimen: BLOOD  Result Value Ref Range Status   Specimen Description BLOOD SITE NOT SPECIFIED  Final   Special Requests   Final    BOTTLES DRAWN AEROBIC AND ANAEROBIC Blood Culture adequate  volume   Culture   Final    NO GROWTH 2 DAYS Performed at [facility], [place] [address], [place], [place] [address]    Report Status PENDING  Incomplete  Culture, blood (single)     Status: None (Preliminary result)   Collection Time: 11/24/20  9:55 PM   Specimen: BLOOD  Result Value Ref Range Status   Specimen Description BLOOD SITE NOT SPECIFIED  Final   Special Requests   Final    BOTTLES DRAWN AEROBIC AND ANAEROBIC Blood Culture adequate volume   Culture   Final    NO GROWTH 2 DAYS Performed at [facility], [address], [place], [place] [address]    Report Status PENDING  Incomplete         Radiology Studies: CT HEAD WO CONTRAST  Result Date: 11/25/2020 CLINICAL DATA:  Delirium EXAM: CT HEAD WITHOUT CONTRAST TECHNIQUE: Contiguous axial images were obtained from the base of the skull through the vertex without intravenous contrast. COMPARISON:  10/24/2020 FINDINGS: Brain: There is no mass, hemorrhage or extra-axial collection. There is generalized atrophy without lobar predilection. Hypodensity of the white matter is most commonly associated with chronic microvascular disease. Vascular: No abnormal hyperdensity of the major intracranial arteries or dural venous sinuses. No intracranial atherosclerosis. Skull: The visualized skull base, calvarium and extracranial soft tissues are normal. Sinuses/Orbits: No fluid levels or advanced mucosal thickening of the visualized paranasal sinuses. No mastoid or middle ear effusion. The orbits are normal. IMPRESSION: Generalized atrophy and chronic microvascular ischemia without acute intracranial abnormality. Electronically Signed   By: Ulyses Jarred M.D.   On: 11/25/2020 03:53   DG Chest [facility]  1 View  Result Date: 11/24/2020 CLINICAL DATA:  Sepsis EXAM: PORTABLE CHEST 1 VIEW COMPARISON:  10/24/2020 FINDINGS: There is increased opacity within the right lung. No pleural effusion or pneumothorax. Normal cardiomediastinal contours.  IMPRESSION: Right lung opacities, likely pneumonia. Electronically Signed   By: Ulyses Jarred M.D.   On: 11/24/2020 21:40   DG Swallowing Func-Speech Pathology  Result Date: 11/26/2020 Formatting of this result is different from the original. Objective Swallowing Evaluation: Type of Study: MBS-Modified Barium Swallow Study  Patient Details Name: CHELESEA WEIAND MRN: [id number] Date of Birth: April 17, 1961 Today's Date: 11/26/2020 Time: SLP Start Time (ACUTE ONLY): 1215 -SLP Stop Time (ACUTE ONLY): 1227 SLP Time Calculation (min) (ACUTE ONLY): 12 min Past Medical History: Past Medical History: Diagnosis Date  Abnormal LFTs   a. 05/2014 -  ALT 52, alk phos 130.  Chronic systolic CHF (congestive heart failure) ([place])   a. Dx 05/2014 - echo at [facility] - EF 30-35%, moderate LVH, no rWMA, mild to moderate MR, moderate PH with PA pressure 60-13mHg, mild to moderate pericardial effusion. EF 30-35% by cath.  History of blood transfusion 1986  "related to C-section"  Hypertension   Insulin dependent diabetes mellitus   Iron deficiency anemia   [place]mic [place]   [place]:  minor nonobstructive CAD with mild irregularities in the LAD less than 10-20%, LCx with mild disease less than 20%, no significant disease in the RCA, EF 30-35%.  Pericardial effusion   a. [facility]  Pulmonary [facility] ([place]   [facility]. [facility]  Tobacco abuse  Past Surgical History: Past Surgical History: Procedure Laterality Date  [place]Y  [place]  Procedure: LEFT HEART CATHETERIZATION WITH CORONARY ANGIOGRAM;  Surgeon: Peter M JMartinique MD;  [facility];  Service: Cardiovascular;  Laterality: N/A; HPI: Pt is a 60y.o. female who presented to the ED via EMS due to unresponsiveness and severe hypoglycemia. CT chest: Multifocal right lung pneumonia. PMH: diabetes, CVA,  nonischemic cardiomyopathy, pulmonary hypertension, hypertension. [place] at which [place] she was on a dysphgia 2 diet and thin liquids.  No data recorded Assessment / Plan / Recommendation CHL IP CLINICAL IMPRESSIONS 11/26/2020 Clinical Impression Pt was seen in radiology suite for modified barium swallow study. Trials of puree solids, regular texture solids, a 136mbarium tablet, and thin liquids via cup and straw were administered. Pt's oropharyngeal swallow mechanism was within functional limits. It is recommended that a regular texture diet with thin liquids be continued at this time to maximize pt's options. Further skilled SLP services are not clinically indicated at this time. SLP Visit Diagnosis Dysphagia, unspecified (R13.10) Attention and concentration deficit following -- Frontal lobe and executive function deficit following -- Impact on safety and function Mild aspiration risk   CHL IP TREATMENT RECOMMENDATION 11/26/2020 Treatment Recommendations Therapy as outlined in treatment plan below   Prognosis 11/26/2020 Prognosis for Safe Diet Advancement Good Barriers to Reach Goals -- Barriers/Prognosis Comment -- CHL IP DIET RECOMMENDATION 11/26/2020 SLP Diet Recommendations Regular solids;Thin liquid Liquid Administration via Cup;Straw Medication Administration Whole meds with liquid Compensations Slow rate;Small sips/bites Postural Changes --   CHL IP OTHER RECOMMENDATIONS 11/26/2020 Recommended Consults -- Oral Care Recommendations Oral care BID Other Recommendations --   CHL IP FOLLOW UP RECOMMENDATIONS 11/26/2020 Follow up Recommendations None   CHL IP  FREQUENCY AND DURATION 11/26/2020 Speech Therapy Frequency (ACUTE ONLY) min 1 x/week Treatment Duration 1 week      CHL IP ORAL PHASE 11/26/2020 Oral Phase WFL Oral - Pudding Teaspoon -- Oral - Pudding Cup -- Oral - Honey Teaspoon -- Oral - Honey Cup -- Oral - Nectar Teaspoon -- Oral - Nectar Cup -- Oral - Nectar Straw -- Oral -  Thin Teaspoon -- Oral - Thin Cup -- Oral - Thin Straw -- Oral - Puree -- Oral - Mech Soft -- Oral - Regular -- Oral - Multi-Consistency -- Oral - Pill -- Oral Phase - Comment --  CHL IP PHARYNGEAL PHASE 11/26/2020 Pharyngeal Phase WFL Pharyngeal- Pudding Teaspoon -- Pharyngeal -- Pharyngeal- Pudding Cup -- Pharyngeal -- Pharyngeal- Honey Teaspoon -- Pharyngeal -- Pharyngeal- Honey Cup -- Pharyngeal -- Pharyngeal- Nectar Teaspoon -- Pharyngeal -- Pharyngeal- Nectar Cup -- Pharyngeal -- Pharyngeal- Nectar Straw -- Pharyngeal -- Pharyngeal- Thin Teaspoon -- Pharyngeal -- Pharyngeal- Thin Cup Penetration/Aspiration during swallow Pharyngeal Material enters airway, remains ABOVE vocal cords then ejected out Pharyngeal- Thin Straw Penetration/Aspiration during swallow Pharyngeal Material enters airway, remains ABOVE vocal cords then ejected out Pharyngeal- Puree -- Pharyngeal -- Pharyngeal- Mechanical Soft -- Pharyngeal -- Pharyngeal- Regular -- Pharyngeal -- Pharyngeal- Multi-consistency -- Pharyngeal -- Pharyngeal- Pill Penetration/Aspiration during swallow Pharyngeal Material enters airway, remains ABOVE vocal cords then ejected out Pharyngeal Comment --  Shanika I. Hardin Negus, [place], [place] Office number [phone] Pager [phone] Horton Marshall 11/26/2020, 1:24 PM              CT CHEST ABDOMEN PELVIS WO CONTRAST  Result Date: 11/25/2020 CLINICAL DATA:  Respiratory failure EXAM: CT CHEST, ABDOMEN AND PELVIS WITHOUT CONTRAST TECHNIQUE: Multidetector CT imaging of the chest, abdomen and pelvis was performed following the standard protocol without IV contrast. COMPARISON:  Chest radiograph dated 11/24/2020. CTA chest dated 06/09/2014. FINDINGS: CT CHEST FINDINGS Cardiovascular: Heart is normal in size.  No pericardial effusion. No evidence of thoracic aortic aneurysm. Mild atherosclerotic calcifications of the descending thoracic aorta. Three vessel coronary atherosclerosis.  Mediastinum/Nodes: No suspicious mediastinal lymphadenopathy. Visualized thyroid is unremarkable. Lungs/Pleura: Multifocal patchy opacities predominantly in the posterior right upper lobe and posterior right lower lobe, with additional tree-in-bud nodularity throughout the right lung, compatible with multifocal pneumonia. Mild patchy opacity in the posterior left lower lobe, possibly atelectasis. Mild centrilobular and paraseptal emphysematous changes, upper lung predominant. No pleural effusion or pneumothorax. Musculoskeletal: Visualized osseous structures are within normal limits. CT ABDOMEN PELVIS FINDINGS Hepatobiliary: Unenhanced liver is unremarkable. Gallbladder is unremarkable. No intrahepatic or extrahepatic ductal dilatation. Pancreas: Within normal limits. Spleen: Within normal limits. Adrenals/Urinary Tract: Adrenal glands are within normal limits. Left renal atrophy. Right kidney is within normal limits. No renal calculi or hydronephrosis. Bladder is within normal limits. Stomach/Bowel: Stomach is within normal limits. No evidence of bowel obstruction. Appendix is not discretely visualized. Vascular/Lymphatic: No evidence of abdominal aortic aneurysm. Atherosclerotic calcifications of the abdominal aorta and branch vessels. No suspicious abdominopelvic lymphadenopathy. Reproductive: Uterus is within normal limits. No adnexal masses. Other: No abdominopelvic ascites. Musculoskeletal: Visualized osseous structures are within normal limits. IMPRESSION: Multifocal right lung pneumonia. Mild left renal atrophy. No acute findings in the abdomen/pelvis. Electronically Signed   By: Julian Hy M.D.   On: 11/25/2020 03:54        Scheduled Meds:  aspirin EC  81 mg Oral Daily   clopidogrel  75 mg Oral Daily   dronabinol  5 mg Oral BID AC  enoxaparin (LOVENOX) injection  40 mg Subcutaneous Q24H   feeding supplement  237 mL Oral TID BM   ivabradine  2.5 mg Oral BID WC   mirtazapine  7.5 mg Oral  QHS   multivitamin with minerals  1 tablet Oral Daily   [START ON 11/28/2020] thiamine injection  100 mg Intravenous Daily   Continuous Infusions:  azithromycin Stopped (11/26/20 0022)   cefTRIAXone (ROCEPHIN)  IV Stopped (11/25/20 2230)   dextrose 5 % and 0.45% NaCl 100 mL/hr at 11/26/20 0855   lactated ringers 75 mL/hr at 11/26/20 2258   thiamine injection 500 mg (11/26/20 1425)     LOS: 1 day     Phillips Climes, MD [facility]   To contact the attending provider between 7A-7P or the covering provider during after hours 7P-7A, please log into the web site [email or web address] and access using universal  password for that web site. If you do not have the password, please call the hospital operator.  11/26/2020, 4:09 PM

## 2020-11-26 NOTE — Consult Note (Signed)
Consultation Note Date: 11/26/2020   Patient Name: Christine Benitez  DOB: Jan 08, 1961  MRN: [id number]  Age / Sex: 60 y.o., female  PCP: Claiborne Billings, MD Referring Physician: Albertine Patricia, MD  Reason for Consultation: Establishing goals of care  HPI/Patient Profile: 60 y.o. female  with past medical history of type 1?/2? diabetes, prior CVA, nonischemic cardiomyopathy, pulmonary hypertension, hypertension admitted on 11/24/2020 with FTT, sepsis/ 2/2 CAP.   Clinical Assessment and Goals of Care: I have reviewed medical records including EPIC notes, labs and imaging, received report from RN, assessed the patient.  Ms. Rausch is lying quietly in bed.  She is unable to tell me her name or participate in any conversation.   Call to daughter, [name], to discuss diagnosis prognosis, [place], EOL wishes, disposition and options. She is unable to have disscussion at this time. We plan on a phone call for [place] discussions around 10 am 7/21.   PMT will continue to support holistically.   HCPOA   NEXT OF KIN - daughter Evelynn Hench    SUMMARY OF RECOMMENDATIONS   Continue to treat the treatable, no CPR or intubation,  Time for outcome.  PMT to call for [facility] 7/21    Code Status/Advance Care Planning: DNR  Symptom Management:  Per hospitalist, no additional needs at this time.  Palliative Prophylaxis:  Oral Care and Turn Reposition  Additional Recommendations (Limitations, Scope, Preferences): Continue to treat the treatable but no CPR or intubation  Psycho-social/Spiritual:  Desire for further Chaplaincy support:no Additional Recommendations: Caregiving  Support/Resources and Education on Hospice  Prognosis:  Unable to determine, based on outcomes.  Discharge Planning:  To be determined, based on outcomes.       Primary Diagnoses: Present on Admission:  Severe sepsis ([place])   I have  reviewed the medical record, interviewed the patient and family, and examined the patient. The following aspects are pertinent.  Past Medical History:  Diagnosis Date   Abnormal LFTs    a. 05/2014 -  ALT 52, alk phos 130.   Chronic systolic CHF (congestive heart failure) ([place])    a. Dx 05/2014 - echo at [facility] - EF 30-35%, moderate LVH, no rWMA, mild to moderate MR, moderate PH with PA pressure 60-9mHg, mild to moderate pericardial effusion. EF 30-35% by cath.   History of blood transfusion 1986   "related to C-section"   Hypertension    Insulin dependent diabetes mellitus    Iron deficiency anemia    [place]mic [place]    [place]  minor nonobstructive CAD with mild irregularities in the LAD less than 10-20%, LCx with mild disease less than 20%, no significant disease in the RCA, EF 30-35%.   Pericardial effusion    a. [facility]   P[place] hypertension [place]    [facility]. [facility]   Tobacco abuse    Social History   Socioeconomic History   Marital status: Divorced    Spouse name: Not on file  Number of children: Not on file   Years of education: Not on file   Highest education level: Not on file  Occupational History   Not on file  Tobacco Use   Smoking status: Former    Packs/day: 0.50    Years: 22.00    Pack years: 11.00    Types: Cigarettes    Quit date: 03/26/2015    Years since quitting: 5.6   Smokeless tobacco: Never  Vaping Use   Vaping Use: Never used  Substance and Sexual Activity   Alcohol use: No   Drug use: No   Sexual activity: Yes    Birth control/protection: None  Other Topics Concern   Not on file  Social History Narrative   Not on file   Social Determinants of Health   Financial Resource Strain: Not on file  Food Insecurity: Not on file  Transportation Needs: Not on file  Physical Activity: Not on file  Stress: Not on file  Social Connections: Not on  file   Family History  Problem Relation Age of Onset   Coronary artery disease Mother    Diabetes Mother    Lupus Mother    Stroke Mother    Lung cancer Father    Scheduled Meds:  aspirin EC  81 mg Oral Daily   clopidogrel  75 mg Oral Daily   dronabinol  5 mg Oral BID AC   enoxaparin (LOVENOX) injection  40 mg Subcutaneous Q24H   feeding supplement  237 mL Oral TID BM   ivabradine  2.5 mg Oral BID WC   mirtazapine  7.5 mg Oral QHS   multivitamin with minerals  1 tablet Oral Daily   [START ON 11/28/2020] thiamine injection  100 mg Intravenous Daily   Continuous Infusions:  azithromycin Stopped (11/26/20 0022)   cefTRIAXone (ROCEPHIN)  IV Stopped (11/25/20 2230)   dextrose 5 % and 0.45% NaCl 100 mL/hr at 11/26/20 0855   lactated ringers 75 mL/hr at 11/26/20 0702   thiamine injection Stopped (11/26/20 0603)   PRN Meds:.acetaminophen, ipratropium-albuterol Medications Prior to Admission:  Prior to Admission medications   Medication Sig Start Date End Date Taking? Authorizing Provider  albuterol (VENTOLIN HFA) 108 (90 Base) MCG/ACT inhaler Inhale 2 puffs into the lungs every 6 (six) hours as needed for wheezing or shortness of breath. 10/28/20  Yes Allie Bossier, MD  aspirin 81 MG EC tablet TAKE 1 TABLET (81 MG TOTAL) BY MOUTH DAILY. Patient taking differently: Take 81 mg by mouth daily. 09/10/14  Yes Martinique, Peter M, MD  atorvastatin (LIPITOR) 40 MG tablet Take 1 tablet (40 mg total) by mouth every evening. 03/26/20  Yes Sanjuan Dame, MD  clopidogrel (PLAVIX) 75 MG tablet Take 1 tablet (75 mg total) by mouth daily. 10/28/20 10/28/21 Yes Allie Bossier, MD  dronabinol (MARINOL) 5 MG capsule Take 1 capsule (5 mg total) by mouth 2 (two) times daily before lunch and supper. 10/28/20  Yes Allie Bossier, MD  feeding supplement (ENSURE ENLIVE / ENSURE PLUS) LIQD Take 237 mLs by mouth 2 (two) times daily between meals. 11/06/20  Yes Hosie Poisson, MD  insulin aspart (NOVOLOG FLEXPEN) 100  UNIT/ML FlexPen Inject 8 Units into the skin 3 (three) times daily with meals. 11/18/20  Yes Nicole Kindred A, DO  insulin glargine (LANTUS) 100 UNIT/ML Solostar Pen Inject 10 Units into the skin 2 (two) times daily. 11/18/20  Yes Nicole Kindred A, DO  Insulin Pen Needle 32G X 4 MM MISC use as  directed 10/28/20  Yes Allie Bossier, MD  ivabradine (CORLANOR) 5 MG TABS tablet Take 0.5 tablets (2.5 mg total) by mouth 2 (two) times daily with a meal. 10/16/20  Yes Lyda Jester M, PA-C  mirtazapine (REMERON SOL-TAB) 15 MG disintegrating tablet Take 0.5 tablets (7.5 mg total) by mouth at bedtime. 03/26/20  Yes Sanjuan Dame, MD  Multiple Vitamin (MULTIVITAMIN WITH MINERALS) TABS tablet Take 1 tablet by mouth daily. 11/07/20  Yes Hosie Poisson, MD  sodium zirconium cyclosilicate (LOKELMA) 10 g PACK packet Take 10 g by mouth 2 (two) times daily. 11/18/20 12/18/20 Yes Nicole Kindred A, DO  feeding supplement (ENSURE ENLIVE / ENSURE PLUS) LIQD Take 237 mLs by mouth 3 (three) times daily between meals. Patient not taking: Reported on 11/25/2020 11/18/20   Nicole Kindred A, DO   No Known Allergies Review of Systems  Unable to perform ROS: Acuity of condition   Physical Exam Vitals and nursing note reviewed.  Constitutional:      General: She is not in acute distress.    Appearance: She is ill-appearing.  HENT:     Mouth/Throat:     Mouth: Mucous membranes are moist.  Cardiovascular:     Rate and Rhythm: Normal rate.  Pulmonary:     Effort: Pulmonary effort is normal. No respiratory distress.  Musculoskeletal:        General: No swelling.  Skin:    General: Skin is warm.  Neurological:     Comments: Unable to tell me her name    Vital Signs: BP 129/77 (BP Location: Left Arm)   Pulse (!) 108   Temp 99.7 F (37.6 C) (Oral)   Resp 20   Ht _0  (1.626 m)   Wt 48.3 kg   SpO2 95%   BMI 18.28 kg/m  Pain Scale: 0-10 POSS *See Group Information*: 1-Acceptable,Awake and alert Pain Score:  0-No pain   SpO2: SpO2: 95 % O2 Device:SpO2: 95 % O2 Flow Rate: .O2 Flow Rate (L/min): 2 L/min  IO: Intake/output summary:  Intake/Output Summary (Last 24 hours) at 11/26/2020 1544 Last data filed at 11/26/2020 4825 Gross per 24 hour  Intake 1671.72 ml  Output 400 ml  Net 1271.72 ml    LBM: Last BM Date: 11/25/20 Baseline Weight: Weight: 49 kg Most recent weight: Weight: 48.3 kg     Palliative Assessment/Data:   Flowsheet Rows    Flowsheet Row Most Recent Value  Intake Tab   Referral Department Hospitalist  Unit at Time of Referral Intermediate Care Unit  Palliative Care Primary Diagnosis Sepsis/Infectious Disease  Date Notified 11/25/20  Palliative Care Type Return patient Palliative Care  Reason for referral Clarify Goals of Care  Date of Admission 11/24/20  Date first seen by Palliative Care 11/26/20  # of days Palliative referral response time 1 Day(s)  # of days IP prior to Palliative referral 1  Clinical Assessment   Palliative Performance Scale Score 20%  Pain Max last 24 hours Not able to report  Pain Min Last 24 hours Not able to report  Dyspnea Max Last 24 Hours Not able to report  Dyspnea Min Last 24 hours Not able to report  Psychosocial & Spiritual Assessment   Palliative Care Outcomes        Time In: 1440 Time Out: 1510 Time Total: 30 minutes  Greater than 50%  of this time was spent counseling and coordinating care related to the above assessment and plan.  Signed by: Drue Novel, NP  Please contact Palliative Medicine Team phone at [phone] for questions and concerns.  For individual provider: See Shea Evans

## 2020-11-26 NOTE — Plan of Care (Signed)
  Problem: Education: Goal: Ability to describe self-care measures that may prevent or decrease complications (Diabetes Survival Skills Education) will improve Outcome: Progressing Goal: Individualized Educational Video(s) Outcome: Progressing   Problem: Nutritional: Goal: Maintenance of adequate nutrition will improve Outcome: Progressing Goal: Progress toward achieving an optimal weight will improve Outcome: Progressing   

## 2020-11-26 NOTE — Progress Notes (Signed)
Modified Barium Swallow Progress Note  Patient Details  Name: Christine Benitez MRN: [id number] Date of Birth: 06-Jun-1960  Today's Date: 11/26/2020  Modified Barium Swallow completed.  Full report located under Chart Review in the Imaging Section.  Brief recommendations include the following:  Clinical Impression  Pt was seen in radiology suite for modified barium swallow study. Trials of puree solids, regular texture solids, a 52mm barium tablet, and thin liquids via cup and straw were administered. Pt's oropharyngeal swallow mechanism was within functional limits. It is recommended that a regular texture diet with thin liquids be continued at this time to maximize pt's options. Further skilled SLP services are not clinically indicated at this time.   Swallow Evaluation Recommendations       SLP Diet Recommendations: Regular solids;Thin liquid   Liquid Administration via: Cup;Straw   Medication Administration: Whole meds with liquid   Supervision: Patient able to self feed   Compensations: Slow rate;Small sips/bites       Oral Care Recommendations: Oral care BID       Betzabeth Derringer I. Vear Clock, MS, CCC-SLP Acute Rehabilitation Services Office number [phone] Pager [phone]  Scheryl Marten 11/26/2020,1:18 PM

## 2020-11-27 LAB — GLUCOSE, CAPILLARY
Glucose-Capillary: 239 mg/dL — ABNORMAL HIGH (ref 70–99)
Glucose-Capillary: 248 mg/dL — ABNORMAL HIGH (ref 70–99)
Glucose-Capillary: 249 mg/dL — ABNORMAL HIGH (ref 70–99)
Glucose-Capillary: 264 mg/dL — ABNORMAL HIGH (ref 70–99)
Glucose-Capillary: 276 mg/dL — ABNORMAL HIGH (ref 70–99)
Glucose-Capillary: 302 mg/dL — ABNORMAL HIGH (ref 70–99)
Glucose-Capillary: 316 mg/dL — ABNORMAL HIGH (ref 70–99)
Glucose-Capillary: 339 mg/dL — ABNORMAL HIGH (ref 70–99)
Glucose-Capillary: 343 mg/dL — ABNORMAL HIGH (ref 70–99)
Glucose-Capillary: 388 mg/dL — ABNORMAL HIGH (ref 70–99)
Glucose-Capillary: 423 mg/dL — ABNORMAL HIGH (ref 70–99)
Glucose-Capillary: 459 mg/dL — ABNORMAL HIGH (ref 70–99)

## 2020-11-27 LAB — CBC
HCT: 26.8 % — ABNORMAL LOW (ref 36.0–46.0)
Hemoglobin: 8.4 g/dL — ABNORMAL LOW (ref 12.0–15.0)
MCH: 31.6 pg (ref 26.0–34.0)
MCHC: 31.3 g/dL (ref 30.0–36.0)
MCV: 100.8 fL — ABNORMAL HIGH (ref 80.0–100.0)
Platelets: 245 10*3/uL (ref 150–400)
RBC: 2.66 MIL/uL — ABNORMAL LOW (ref 3.87–5.11)
RDW: 14.3 % (ref 11.5–15.5)
WBC: 13.2 10*3/uL — ABNORMAL HIGH (ref 4.0–10.5)
nRBC: 0 % (ref 0.0–0.2)

## 2020-11-27 LAB — BASIC METABOLIC PANEL
Anion gap: 11 (ref 5–15)
BUN: 12 mg/dL (ref 6–20)
CO2: 23 mmol/L (ref 22–32)
Calcium: 9.1 mg/dL (ref 8.9–10.3)
Chloride: 104 mmol/L (ref 98–111)
Creatinine, Ser: 1.37 mg/dL — ABNORMAL HIGH (ref 0.44–1.00)
GFR, Estimated: 45 mL/min — ABNORMAL LOW (ref >60)
Glucose, Bld: 301 mg/dL — ABNORMAL HIGH (ref 70–99)
Potassium: 5.6 mmol/L — ABNORMAL HIGH (ref 3.5–5.1)
Sodium: 138 mmol/L (ref 135–145)

## 2020-11-27 MED ORDER — SODIUM ZIRCONIUM CYCLOSILICATE 10 G PO PACK
10.0000 g | PACK | ORAL | Status: AC
  Administered 2020-11-27: 10 g via ORAL
  Filled 2020-11-27: qty 1

## 2020-11-27 NOTE — Progress Notes (Signed)
Palliative:   Christine Benitez is resting quietly in bed.  She is more alert today and able to tell me her name and that we are in the hospital.  She is able to make her basic needs known.  There is no family at bedside at this time.   Call to daughter, Christine Benitez.  We talk about Christine Benitez's current health concerns, including, but not limited to her PNE and the treatment plan. We talk about time for outcomes. Christine Benitez states that they would like for Christine Benitez to return home.  We talk about trial for PT.  Christine Benitez states they would prefer for her to return home. Christine Benitez is active with outpatient [facility] with palliative services, but they had been unable to visit prior to rehospitalization.   Conference with attending, bedside nursing staff, and TOC tream related to patient condition, needs, GOC.   Plan:  continue to treat the treatable, but no CPR or Intubation. Time for outcome. Home with HH if qualifies and can find provider. Active with outpatient palliative [facility], but had not been seen dt rehospitalization.   35 minutes  Christine Carmel, NP Palliative Medicine Team  Team Phone [phone] Greater than 50% of this time was spent counseling and coordinating care related to the above assessment and plan.

## 2020-11-27 NOTE — Progress Notes (Signed)
   11/27/20 0457  Assess: MEWS Score  Temp 99.4 F (37.4 C)  BP (!) 143/82  Pulse Rate (!) 112  Resp 16  Level of Consciousness Alert  SpO2 92 %  O2 Device Room Air  Assess: MEWS Score  MEWS Temp 0  MEWS Systolic 0  MEWS Pulse 2  MEWS RR 0  MEWS LOC 0  MEWS Score 2  MEWS Score Color Yellow  Assess: if the MEWS score is Yellow or Red  Were vital signs taken at a resting state? Yes  Focused Assessment No change from prior assessment  Early Detection of Sepsis Score *See Row Information* Medium  MEWS guidelines implemented *See Row Information* No, previously yellow, continue vital signs every 4 hours  Take Vital Signs  Increase Vital Sign Frequency  Yellow: Q 2hr X 2 then Q 4hr X 2, if remains yellow, continue Q 4hrs  Escalate  MEWS: Escalate Yellow: discuss with charge nurse/RN and consider discussing with provider and RRT  Notify: Charge Nurse/RN  Name of Charge Nurse/RN Notified Joni Reining RN  Date Charge Nurse/RN Notified 11/27/20  Time Charge Nurse/RN Notified 0500  Notify: Provider  Provider Name/Title Rathore MD  Date Provider Notified 11/27/20  Time Provider Notified 0500  Notification Type Page  Notification Reason Other (Comment) (Yellow MEWS)  Provider response No new orders

## 2020-11-27 NOTE — Progress Notes (Signed)
PROGRESS NOTE    Christine Benitez  SFK:[id number] DOB: 1960/11/22 DOA: 11/24/2020 PCP: Claiborne Billings, MD    Chief Complaint  Patient presents with   Hypoglycemia    Brief Narrative:  Christine Benitez is a 60 y.o. female with medical history significant for type 1? Type 2? diabetes, prior CVA, nonischemic cardiomyopathy, pulmonary hypertension, hypertension, who presented to [facility] ED from home via EMS due to unresponsiveness and severe hypoglycemia with CBG 26.  History is mainly obtained from patient's mother who is also her medical power of attorney via phone.  The patient lives with her adult daughter, and a nephew who is visiting and assisting with her care.  Around 11:30 AM on 11/24/20 her mother went to visit her at home she would not wake up.  Her mother left.  She called to find out how the patient was doing.  She was told that the patient was still lethargic and was becoming unresponsive.  Her daughter called 911 and per the patient's mother was told to do CPR/chest compressions until EMS arrives.  Unclear how long she had chest compressions.  Upon EMS arrival the patient was severely hypoglycemic with CBG of 26.  She received 125 cc of D10 with improvement of CBG 202.  Not sure if they had informed EMS of the CPR being performed.  Patient was brought to the ED for further evaluation.  She presented with an I.O. access in her right leg, she was confused and pulling at lines, soft restraints were placed in upper extremities bilaterally for patient's safety.  Initial O2 saturation 65% on room air.  On nonrebreather she went up to 96%.  She was weaned down to 4 L.  Work-up in the ED revealed right-sided pneumonia.  Per her mother, she was fine prior to this event.  Suspected that she took too much insulin.  Unclear if she administered the insulin herself or with assistance.  TRH, hospitalist team, was asked to admit.   She's been admitted with sepsis due to pneumonia and hypoglycemia.  11/27/2020: She  is alert but minimally interactive.  Hyperglycemic will decrease rate of D5 half-normal saline.   Assessment & Plan:   Active Problems:   Severe sepsis (HCC)  Goals of Care  Failure to thrive A prior hospitalizations seen by palliative with discussion of hospice Palliative medicine is following Continue marinol, remeron   Sepsis 2/2 Community Acquired Pneumonia Has risk factors for MDRO - recent hospital stay, follow MRSA/sputum cx Given AMS with hypoglycemia, aspiration possible as well Continue ceftriaxone, azithromycin for now, broaden prn CT showing multifocal right lung pneumonia Blood cultures with no growth to date. Urine strep, legionella, MRSA PCR, sputum Strict I/O, daily weights Continue IVF Lactic acid has normalized, procalcitonin remains elevated at 7.  Hypoglycemia  T2DM, now hyperglycemic Recent discharge 7/12 for similar issue it seems, thought 2/2 inadequate oral intake -he had cosyntropin test done this morning with good response 4.7 >> 21>> 26, so there is no evidence of renal insufficiency. -Hypoglycemic this morning despite not receiving any insulin, she required D50 and D5 half-normal saline. -Does appear daughter has been giving her higher dose, due to lack of education, apparently patient been getting 20 units of Lantus twice daily, NovoLog 20 TID, which is much higher than what she is supposed to be taking, she will need educated clearly upon discharge about the appropriate dose. Hyperglycemic, decrease rate of D5 half-normal saline.  Lactic Acidosis  NAGMA Continue with IV fluids  Nonischemic Cardiomyopathy  10/2020 with EF 50-55%, no RWMA, grade 1 diastolic dysfunction (improved from 03/2020 - EF 20-25%) Continue corlanor Follow volume status, attn to IVF  Hx CVA Hx L sided weakness and cognitive impairment DAPT, lipitor  Acute Metabolic Encephalopathy Delirium precautions 2/2 above, follow  Thiamine  Prolonged Qtc Improved, follow  DVT  prophylaxis: SCD Code Status: DNR Family Communication: none at bedside- daughter  over phone Disposition:   Status is: Inpatient  Remains inpatient appropriate because:Inpatient level of care appropriate due to severity of illness  Dispo: The patient is from: Home              Anticipated d/c is to: Home              Patient currently is not medically stable to d/c.   Difficult to place patient No       Consultants:  none  Procedures:  none  Antimicrobials: Anti-infectives (From admission, onward)    Start     Dose/Rate Route Frequency Ordered Stop   11/24/20 2200  cefTRIAXone (ROCEPHIN) 2 g in sodium chloride 0.9 % 100 mL IVPB        2 g 200 mL/hr over 30 Minutes Intravenous Every 24 hours 11/24/20 2146     11/24/20 2200  azithromycin (ZITHROMAX) 500 mg in sodium chloride 0.9 % 250 mL IVPB        500 mg 250 mL/hr over 60 Minutes Intravenous Every 24 hours 11/24/20 2146         Objective: Vitals:   11/27/20 0500 11/27/20 0700 11/27/20 0859 11/27/20 1300  BP:  135/79 137/85 118/70  Pulse:  (!) 110 (!) 111 (!) 106  Resp:  (!) _0 Temp:  (!) 96.9 F (36.1 C) 99.5 F (37.5 C) 98.8 F (37.1 C)  TempSrc:  Axillary Oral Oral  SpO2:  91% 90% 92%  Weight: 48.7 kg     Height:        Intake/Output Summary (Last 24 hours) at 11/27/2020 1625 Last data filed at 11/27/2020 0400 Gross per 24 hour  Intake 1825.19 ml  Output 400 ml  Net 1425.19 ml   Filed Weights   11/24/20 2356 11/26/20 0500 11/27/20 0500  Weight: 49 kg 48.3 kg 48.7 kg    Examination:  General: Frail-appearing in no acute distress.  She is alert and minimally interactive.   Respiratory: Clear to auscultation no wheezes or rales.  Poor inspiratory effort.   Cardiac: Regular rate and rhythm no rubs or gallops.  No JVD or thyromegaly noted.  GI: No tenderness not distended bowel sounds present. Musculoskeletal: No edema clubbing or cyanosis. Skin: No rashes.    Data Reviewed: I have  personally reviewed following labs and imaging studies  CBC: Recent Labs  Lab 11/24/20 2119 11/25/20 0128 11/25/20 0153 11/25/20 0909 11/26/20 0530 11/27/20 0317  WBC 11.1*  --   --   --  14.0* 13.2*  NEUTROABS 9.4*  --   --   --   --   --   HGB 10.5* 10.5* 10.5* 10.5* 8.8* 8.4*  HCT 34.6* 31.0* 31.0* 31.0* 27.3* 26.8*  MCV 103.0*  --   --   --  98.6 100.8*  PLT 343  --   --   --  242 024    Basic Metabolic Panel: Recent Labs  Lab 11/25/20 0229 11/25/20 0853 11/25/20 0909 11/25/20 1520 11/26/20 0530 11/27/20 0317  NA 137 137 140 136 140 138  K 4.5 4.3 4.3 4.1 4.4 5.6*  CL 106 104  --  103 104 104  CO2 24 26  --  21* 27 23  GLUCOSE 320* 189*  --  195* 125* 301*  BUN 13 13  --  _0 CREATININE 1.07* 1.05*  --  1.06* 1.13* 1.37*  CALCIUM 9.1 9.2  --  9.2 9.2 9.1    GFR: Estimated Creatinine Clearance: 34.4 mL/min (A) (by C-G formula based on SCr of 1.37 mg/dL (H)).  Liver Function Tests: Recent Labs  Lab 11/24/20 2119  AST 32  ALT 27  ALKPHOS 64  BILITOT 0.6  PROT 7.0  ALBUMIN 3.6    CBG: Recent Labs  Lab 11/27/20 0814 11/27/20 0959 11/27/20 1204 11/27/20 1420 11/27/20 1613  GLUCAP 239* 249* 248* 316* 459*     Recent Results (from the past 240 hour(s))  Urine Culture     Status: Abnormal   Collection Time: 11/24/20  9:19 PM   Specimen: In/Out Cath Urine  Result Value Ref Range Status   Specimen Description IN/OUT CATH URINE  Final   Special Requests   Final    NONE Performed at [facility], [address] [place], [place] [address]    Culture (A)  Final    10,000 COLONIES/mL MULTIPLE SPECIES PRESENT, SUGGEST RECOLLECTION   Report Status 11/26/2020 FINAL  Final  Blood culture (routine single)     Status: None (Preliminary result)   Collection Time: 11/24/20  9:46 PM   Specimen: BLOOD  Result Value Ref Range Status   Specimen Description BLOOD SITE NOT SPECIFIED  Final   Special Requests   Final    BOTTLES DRAWN AEROBIC AND  ANAEROBIC Blood Culture adequate volume   Culture   Final    NO GROWTH 3 DAYS Performed at [facility], [address] [place], [place] [address]    Report Status PENDING  Incomplete  Culture, blood (single)     Status: None (Preliminary result)   Collection Time: 11/24/20  9:55 PM   Specimen: BLOOD  Result Value Ref Range Status   Specimen Description BLOOD SITE NOT SPECIFIED  Final   Special Requests   Final    BOTTLES DRAWN AEROBIC AND ANAEROBIC Blood Culture adequate volume   Culture   Final    NO GROWTH 3 DAYS Performed at [facility], [address], [place], [place] [address]    Report Status PENDING  Incomplete         Radiology Studies: DG Swallowing Func-Speech Pathology  Result Date: 11/26/2020 Formatting of this result is different from the original. Objective Swallowing Evaluation: Type of Study: MBS-Modified Barium Swallow Study  Patient Details Name: Christine Benitez MRN: [id number] Date of Birth: [birth year] Today's Date: 11/26/2020 Time: SLP Start Time (ACUTE ONLY): 1215 -SLP Stop Time (ACUTE ONLY): 1227 SLP Time Calculation (min) (ACUTE ONLY): 12 min Past Medical History: Past Medical History: Diagnosis Date  Abnormal LFTs   a. 05/2014 -  ALT 52, alk phos 130.  Chronic systolic CHF (congestive heart failure) ([place])   a. Dx 05/2014 - echo at [facility] - EF 30-35%, moderate LVH, no rWMA, mild to moderate MR, moderate PH with PA pressure 60-40mHg, mild to moderate pericardial effusion. EF 30-35% by cath.  History of blood transfusion 1986  "related to C-section"  Hypertension   Insulin dependent diabetes mellitus   Iron deficiency anemia   NICM (nonischemic cardiomyopathy) ([place]   a. LHC 06/11/14:  minor nonobstructive CAD with mild irregularities in the  LAD less than 10-20%, LCx with mild disease less than 20%, no significant disease in the RCA, EF 30-35%.  Pericardial effusion   a. Mild-moderate pericardial effusion by echo at [facility].  Pulmonary hypertension ([place])    a. Moderate PH by echo at [facility].  Tobacco abuse  Past Surgical History: Past Surgical History: Procedure Laterality Date  APPENDECTOMY  ~ 2003  [place] WITH CORONARY ANGIOGRAM N/A 06/11/2014  Procedure: LEFT HEART CATHETERIZATION WITH CORONARY ANGIOGRAM;  Surgeon: Peter M Martinique, MD;  Location: [facility] CATH LAB;  Service: Cardiovascular;  Laterality: N/A; HPI: Pt is a 60 y.o. female who presented to the ED via EMS due to unresponsiveness and severe hypoglycemia. CT chest: Multifocal right lung pneumonia. PMH: diabetes, CVA, nonischemic cardiomyopathy, pulmonary hypertension, hypertension. Pt was alst seen by SLP in [place], 2021 at which time she was on a dysphgia 2 diet and thin liquids.  No data recorded Assessment / Plan / Recommendation CHL IP CLINICAL IMPRESSIONS 11/26/2020 Clinical Impression Pt was seen in radiology suite for modified barium swallow study. Trials of puree solids, regular texture solids, a 67mm barium tablet, and thin liquids via cup and straw were administered. Pt's oropharyngeal swallow mechanism was within functional limits. It is recommended that a regular texture diet with thin liquids be continued at this time to maximize pt's options. Further skilled SLP services are not clinically indicated at this time. SLP Visit Diagnosis Dysphagia, unspecified (R13.10) Attention and concentration deficit following -- Frontal lobe and executive function deficit following -- Impact on safety and function Mild aspiration risk   CHL IP TREATMENT RECOMMENDATION 11/26/2020 Treatment Recommendations Therapy as outlined in treatment plan below   Prognosis 11/26/2020 Prognosis for Safe Diet Advancement Good Barriers to Reach Goals -- Barriers/Prognosis Comment -- CHL IP DIET RECOMMENDATION 11/26/2020 SLP Diet Recommendations Regular solids;Thin liquid Liquid Administration via Cup;Straw Medication Administration Whole meds with liquid Compensations Slow rate;Small  sips/bites Postural Changes --   CHL IP OTHER RECOMMENDATIONS 11/26/2020 Recommended Consults -- Oral Care Recommendations Oral care BID Other Recommendations --   CHL IP FOLLOW UP RECOMMENDATIONS 11/26/2020 Follow up Recommendations None   CHL IP FREQUENCY AND DURATION 11/26/2020 Speech Therapy Frequency (ACUTE ONLY) min 1 x/week Treatment Duration 1 week      CHL IP ORAL PHASE 11/26/2020 Oral Phase WFL Oral - Pudding Teaspoon -- Oral - Pudding Cup -- Oral - Honey Teaspoon -- Oral - Honey Cup -- Oral - Nectar Teaspoon -- Oral - Nectar Cup -- Oral - Nectar Straw -- Oral - Thin Teaspoon -- Oral - Thin Cup -- Oral - Thin Straw -- Oral - Puree -- Oral - Mech Soft -- Oral - Regular -- Oral - Multi-Consistency -- Oral - Pill -- Oral Phase - Comment --  CHL IP PHARYNGEAL PHASE 11/26/2020 Pharyngeal Phase WFL Pharyngeal- Pudding Teaspoon -- Pharyngeal -- Pharyngeal- Pudding Cup -- Pharyngeal -- Pharyngeal- Honey Teaspoon -- Pharyngeal -- Pharyngeal- Honey Cup -- Pharyngeal -- Pharyngeal- Nectar Teaspoon -- Pharyngeal -- Pharyngeal- Nectar Cup -- Pharyngeal -- Pharyngeal- Nectar Straw -- Pharyngeal -- Pharyngeal- Thin Teaspoon -- Pharyngeal -- Pharyngeal- Thin Cup Penetration/Aspiration during swallow Pharyngeal Material enters airway, remains ABOVE vocal cords then ejected out Pharyngeal- Thin Straw Penetration/Aspiration during swallow Pharyngeal Material enters airway, remains ABOVE vocal cords then ejected out Pharyngeal- Puree -- Pharyngeal -- Pharyngeal- Mechanical Soft -- Pharyngeal -- Pharyngeal- Regular -- Pharyngeal -- Pharyngeal- Multi-consistency -- Pharyngeal -- Pharyngeal- Pill Penetration/Aspiration during swallow Pharyngeal Material enters airway, remains ABOVE vocal  cords then ejected out Pharyngeal Comment --  Shanika I. Hardin Negus, [place], [place] Office number [phone] Pager [place] 11/26/2020, 1:24 PM                   Scheduled Meds:  aspirin EC  81  mg Oral Daily   clopidogrel  75 mg Oral Daily   dronabinol  5 mg Oral BID AC   enoxaparin (LOVENOX) injection  40 mg Subcutaneous Q24H   feeding supplement  237 mL Oral TID BM   ivabradine  2.5 mg Oral BID WC   mirtazapine  7.5 mg Oral QHS   multivitamin with minerals  1 tablet Oral Daily   [START ON 11/28/2020] thiamine injection  100 mg Intravenous Daily   Continuous Infusions:  azithromycin Stopped (11/26/20 2316)   cefTRIAXone (ROCEPHIN)  IV Stopped (11/26/20 2212)   dextrose 5 % and 0.45% NaCl 50 mL/hr at 11/27/20 1228   thiamine injection 500 mg (11/27/20 1502)     LOS: 2 days     Kayleen Memos, MD [facility]   To contact the attending provider between 7A-7P or the covering provider during after hours 7P-7A, please log into the web site [email or web address] and access using universal [place] password for that web site. If you do not have the password, please call the hospital operator.  11/27/2020, 4:25 PM

## 2020-11-27 NOTE — Progress Notes (Signed)
Inpatient Diabetes Program Recommendations  AACE/ADA: New Consensus Statement on Inpatient Glycemic Control (2015)  Target Ranges:  Prepandial:   less than 140 mg/dL      Peak postprandial:   less than 180 mg/dL (1-2 hours)      Critically ill patients:  140 - 180 mg/dL   Lab Results  Component Value Date   GLUCAP 239 (H) 11/27/2020   HGBA1C 7.7 (H) 10/24/2020    Review of Glycemic Control Results for SOCORRO, KANITZ (MRN [id number]) as of 11/27/2020 09:40  Ref. Range 11/26/2020 23:46 11/27/2020 01:52 11/27/2020 03:59 11/27/2020 06:03 11/27/2020 08:14  Glucose-Capillary Latest Ref Range: 70 - 99 mg/dL 157 (H) 262 (H) 035 (H) 264 (H) 239 (H)   Diabetes history: Type 2 dm Outpatient Diabetes medications: Lantus 10 units BID, Novolog 8 units TID Current orders for Inpatient glycemic control: None   Inpatient Diabetes Program Recommendations:    If glucose trends continue to exceed 200's mg/dL, consider Novolog 0-6 units Q4H.   Thanks, Lujean Rave, MSN, RNC-OB Diabetes Coordinator [phone] (8a-5p)

## 2020-11-27 NOTE — Progress Notes (Signed)
   11/27/20 0859  Assess: MEWS Score  Temp 99.5 F (37.5 C)  BP 137/85  Pulse Rate (!) 111  ECG Heart Rate (!) 111  Resp 20  SpO2 90 %  Assess: MEWS Score  MEWS Temp 0  MEWS Systolic 0  MEWS Pulse 2  MEWS RR 0  MEWS LOC 0  MEWS Score 2  MEWS Score Color Yellow  Treat  Pain Scale 0-10  Pain Score 0

## 2020-11-27 NOTE — Evaluation (Signed)
Physical Therapy Evaluation Patient Details Name: Christine Benitez MRN: [id number] DOB: [birth year] Today's Date: 11/27/2020   History of Present Illness  60yo female admitted 11/24/20 due to unresponsiveness at home and CBG 26. Did receive CPR from family prior to EMS arrival. O2 65% on room air in the ED, required NRB to recover. Found to be in severe sepsis and acute hypoxic respiratory failure secondary to pneumonia. PMH CHF, HTN, DM, NICM, tobacco abuse  Clinical Impression   Patient received in bed, very HOH and able to follow about 70% of simple cues today. Able to mobilize on a Min-ModA basis with no device, but suspect she will likely do much better with use of RW moving forward. HR mildly elevated with transfer activity. Unsure of what cognitive baseline is, family not present to confirm. Per palliative notes, family is wanting patient to return home at DC. Left up in chair with all needs met, chair alarm active and nursing aware of patient status. Will need HHPT, 24/7A, and DME as below for safe return home.     Follow Up Recommendations Home health PT;Supervision/Assistance - 24 hour;Other (comment) (appropriate for SNF, but family requesting she return home per palliative notes)    Equipment Recommendations  Rolling walker with 5" wheels;3in1 (PT);Wheelchair (measurements PT);Wheelchair cushion (measurements PT)    Recommendations for Other Services       Precautions / Restrictions Precautions Precautions: Fall Precaution Comments: HOH, decreased vision Restrictions Weight Bearing Restrictions: No      Mobility  Bed Mobility   Bed Mobility: Supine to Sit     Supine to sit: Min assist;HOB elevated     General bed mobility comments: MinA to bring trunk up to midline and scoot hips forward all the way to EOB    Transfers Overall transfer level: Needs assistance Equipment used: None Transfers: Sit to/from [facility] Sit to Stand: Min guard Stand  pivot transfers: Mod assist       General transfer comment: min guard for safety, cues for hand placement coming to full stand; needed heavy ModA to transfer without device and tends to furniture walk  Ambulation/Gait             General Gait Details: refused  Stairs            Wheelchair Mobility    Modified Rankin (Stroke Patients Only)       Balance Overall balance assessment: Needs assistance Sitting-balance support: Feet supported;Bilateral upper extremity supported Sitting balance-Leahy Scale: Fair     Standing balance support: No upper extremity supported;During functional activity Standing balance-Leahy Scale: Poor Standing balance comment: ModA to maintain balance without BUE support                             Pertinent Vitals/Pain Pain Assessment: Faces Faces Pain Scale: No hurt Pain Intervention(s): Limited activity within patient's tolerance;Monitored during session    [place] expects to be discharged to:: Private residence Living Arrangements: Children Available Help at Discharge: Family;Available PRN/intermittently Type of Home: House Home Access: Ramped entrance     Home Layout: One level   Additional Comments: pt very poor historian- all PLOF/DME information taken from prior charting    Prior Function Level of Independence: Independent with assistive device(s)         Comments: Pt reports walking with RW and completing ADL by herself, she manages her own meds     Hand Dominance   Dominant  Hand: Right    Extremity/Trunk Assessment   Upper Extremity Assessment Upper Extremity Assessment: Defer to OT evaluation    Lower Extremity Assessment Lower Extremity Assessment: Generalized weakness LLE Deficits / Details: LLE weakness at baseline secondary to CVA    Cervical / Trunk Assessment Cervical / Trunk Assessment: Kyphotic  Communication   Communication: HOH  Cognition Arousal/Alertness:  Awake/alert Behavior During Therapy: Flat affect Overall Cognitive Status: History of cognitive impairments - at baseline Area of Impairment: Attention;Memory;Following commands;Safety/judgement;Awareness;Problem solving;Orientation                 Orientation Level: Disoriented to;Situation Current Attention Level: Focused Memory: Decreased short-term memory Following Commands: Follows one step commands inconsistently;Follows one step commands with increased time Safety/Judgement: Decreased awareness of safety;Decreased awareness of deficits Awareness: Intellectual Problem Solving: Slow processing;Requires verbal cues;Decreased initiation;Difficulty sequencing General Comments: no family present to clarify baseline cognition status- needed simple step by step commands, and followed cues approximatley 70% of the time. Tells me her bed is wet (on first check it was not), then after suction was removed from [place] she urinated in the bed.      General Comments General comments (skin integrity, edema, etc.): HR to 110BPM with activity    Exercises     Assessment/Plan    PT Assessment Patient needs continued PT services  PT Problem List Decreased strength;Decreased activity tolerance;Decreased balance;Decreased mobility;Decreased cognition;Decreased knowledge of use of DME;Decreased coordination       PT Treatment Interventions DME instruction;Gait training;Functional mobility training;Therapeutic activities;Therapeutic exercise;Balance training;Patient/family education;Cognitive remediation    PT Goals (Current goals can be found in the Care Plan section)  Acute Rehab PT Goals Patient Stated Goal: to go home PT Goal Formulation: With patient Time For Goal Achievement: 12/11/20 Potential to Achieve Goals: Good    Frequency Min 3X/week   Barriers to discharge        Co-evaluation               AM-PAC PT "6 Clicks" Mobility  Outcome Measure Help needed turning  from your back to your side while in a flat bed without using bedrails?: A Little Help needed moving from lying on your back to sitting on the side of a flat bed without using bedrails?: A Little Help needed moving to and from a bed to a chair (including a wheelchair)?: A Lot Help needed standing up from a chair using your arms (e.g., wheelchair or bedside chair)?: A Little Help needed to walk in hospital room?: A Lot Help needed climbing 3-5 steps with a railing? : A Lot 6 Click Score: 15    End of Session Equipment Utilized During Treatment: Gait belt Activity Tolerance: Patient tolerated treatment well Patient left: in chair;with call bell/phone within reach;with chair alarm set Nurse Communication: Mobility status PT Visit Diagnosis: Other abnormalities of gait and mobility (R26.89);Muscle weakness (generalized) (M62.81);Difficulty in walking, not elsewhere classified (R26.2)    Time: 3614-4315 PT Time Calculation (min) (ACUTE ONLY): 30 min   Charges:   PT Evaluation $PT Eval Moderate Complexity: 1 Mod PT Treatments $Therapeutic Activity: 8-22 mins       Windell Norfolk, DPT, PN1   Supplemental Physical Therapist [place]    Pager [phone] Acute Rehab Office [phone]

## 2020-11-27 NOTE — Progress Notes (Signed)
PROGRESS NOTE    Christine Benitez  ZOX:[id number] DOB: [birth year] DOA: 11/24/2020 PCP: Claiborne Billings, MD    Chief Complaint  Patient presents with   Hypoglycemia    Brief Narrative:  Christine Benitez is a 60 y.o. female with medical history significant for type 1? Type 2? diabetes, prior CVA, nonischemic cardiomyopathy, pulmonary hypertension, hypertension, who presented to [facility] ED from home via EMS due to unresponsiveness and severe hypoglycemia with CBG 26.  History is mainly obtained from patient's mother who is also her medical power of attorney via phone.  The patient lives with her adult daughter, and a nephew who is visiting and assisting with her care.  Around 11:30 AM on 11/24/20 her mother went to visit her at home she would not wake up.  Her mother left.  She called to find out how the patient was doing.  She was told that the patient was still lethargic and was becoming unresponsive.  Her daughter called 911 and per the patient's mother was told to do CPR/chest compressions until EMS arrives.  Unclear how long she had chest compressions.  Upon EMS arrival the patient was severely hypoglycemic with CBG of 26.  She received 125 cc of D10 with improvement of CBG 202.  Not sure if they had informed EMS of the CPR being performed.  Patient was brought to the ED for further evaluation.  She presented with an I.O. access in her right leg, she was confused and pulling at lines, soft restraints were placed in upper extremities bilaterally for patient's safety.  Initial O2 saturation 65% on room air.  On nonrebreather she went up to 96%.  She was weaned down to 4 L.  Work-up in the ED revealed right-sided pneumonia.  Per her mother, she was fine prior to this event.  Suspected that she took too much insulin.  Unclear if she administered the insulin herself or with assistance.  TRH, hospitalist team, was asked to admit.   She's been admitted with sepsis due to pneumonia and hypoglycemia.   Assessment &  Plan:   Active Problems:   Severe sepsis (HCC)  Goals of Care  Failure to thrive A prior hospitalizations seen by palliative with discussion of hospice Palliative medicine is following Continue marinol, remeron   Sepsis 2/2 Community Acquired Pneumonia Has risk factors for MDRO - recent hospital stay, follow MRSA/sputum cx Given AMS with hypoglycemia, aspiration possible as well Continue ceftriaxone, azithromycin for now, broaden prn CT showing multifocal right lung pneumonia Blood cultures with no growth to date. Urine strep, legionella, MRSA PCR, sputum Strict I/O, daily weights Continue IVF Lactic acid has normalized, procalcitonin remains elevated at 7.  Hypoglycemia  T2DM (H&P notes ?type1 or type2, previous notes from prior hospital stay notes type 2 from what I reviewed) Recent discharge 7/12 for similar issue it seems, thought 2/2 inadequate oral intake -he had cosyntropin test done this morning with good response 4.7 >> 21>> 26, so there is no evidence of renal insufficiency. -Hypoglycemic this morning despite not receiving any insulin, she required D50 and D5 half-normal saline. -Does appear daughter has been giving her higher dose, due to lack of education, apparently patient been getting 20 units of Lantus twice daily, NovoLog 20 TID, which is much higher than what she is supposed to be taking, she will need educated clearly upon discharge about the appropriate dose.  Hyperkalemia likely secondary to renal insufficiency Potassium 5.6 Received a dose of Lokelma 10 g x 1.  Lactic  Acidosis  NAGMA Continue with IV fluids  Nonischemic Cardiomyopathy 10/2020 with EF 50-55%, no RWMA, grade 1 diastolic dysfunction (improved from 03/2020 - EF 20-25%) Continue corlanor Follow volume status, attn to IVF  Hx CVA Hx L sided weakness and cognitive impairment DAPT, lipitor  Acute Metabolic Encephalopathy Delirium precautions 2/2 above, follow  Thiamine  Prolonged  Qtc Improved, follow  DVT prophylaxis: SCD Code Status: DNR Family Communication: none at bedside- daughter  over phone Disposition:   Status is: Inpatient  Remains inpatient appropriate because:Inpatient level of care appropriate due to severity of illness  Dispo: The patient is from: Home              Anticipated d/c is to: Home              Patient currently is not medically stable to d/c.   Difficult to place patient No       Consultants:  none  Procedures:  none  Antimicrobials: Anti-infectives (From admission, onward)    Start     Dose/Rate Route Frequency Ordered Stop   11/24/20 2200  cefTRIAXone (ROCEPHIN) 2 g in sodium chloride 0.9 % 100 mL IVPB        2 g 200 mL/hr over 30 Minutes Intravenous Every 24 hours 11/24/20 2146     11/24/20 2200  azithromycin (ZITHROMAX) 500 mg in sodium chloride 0.9 % 250 mL IVPB        500 mg 250 mL/hr over 60 Minutes Intravenous Every 24 hours 11/24/20 2146            Subjective: Asks for another blanket Can't say why she's here, but knows [facility]'s [facility]  Objective: Vitals:   11/27/20 0457 11/27/20 0500 11/27/20 0700 11/27/20 0859  BP: (!) 143/82  135/79 137/85  Pulse: (!) 112  (!) 110 (!) 111  Resp: 16  (!) 26 20  Temp: 99.4 F (37.4 C)  (!) 96.9 F (36.1 C) 99.5 F (37.5 C)  TempSrc: Oral  Axillary Oral  SpO2: 92%  91% 90%  Weight:  48.7 kg    Height:        Intake/Output Summary (Last 24 hours) at 11/27/2020 0905 Last data filed at 11/27/2020 0400 Gross per 24 hour  Intake 1825.19 ml  Output 400 ml  Net 1425.19 ml   Filed Weights   11/24/20 2356 11/26/20 0500 11/27/20 0500  Weight: 49 kg 48.3 kg 48.7 kg    Examination:  Awake, pleasant,  deconditioned and frail.  Oriented x3. Symmetrical Chest wall movement, Good air movement bilaterally, CTAB RRR,No Gallops,Rubs or new Murmurs, No Parasternal Heave +ve B.Sounds, Abd Soft, No tenderness, No rebound - guarding or rigidity. No Cyanosis, Clubbing or  edema, No new Rash or bruise      Data Reviewed: I have personally reviewed following labs and imaging studies  CBC: Recent Labs  Lab 11/24/20 2119 11/25/20 0128 11/25/20 0153 11/25/20 0909 11/26/20 0530 11/27/20 0317  WBC 11.1*  --   --   --  14.0* 13.2*  NEUTROABS 9.4*  --   --   --   --   --   HGB 10.5* 10.5* 10.5* 10.5* 8.8* 8.4*  HCT 34.6* 31.0* 31.0* 31.0* 27.3* 26.8*  MCV 103.0*  --   --   --  98.6 100.8*  PLT 343  --   --   --  242 536    Basic Metabolic Panel: Recent Labs  Lab 11/25/20 0229 11/25/20 0853 11/25/20 0909 11/25/20 1520 11/26/20 0530  11/27/20 0317  NA 137 137 140 136 140 138  K 4.5 4.3 4.3 4.1 4.4 5.6*  CL 106 104  --  103 104 104  CO2 24 26  --  21* 27 23  GLUCOSE 320* 189*  --  195* 125* 301*  BUN 13 13  --  _0 CREATININE 1.07* 1.05*  --  1.06* 1.13* 1.37*  CALCIUM 9.1 9.2  --  9.2 9.2 9.1    GFR: Estimated Creatinine Clearance: 34.4 mL/min (A) (by C-G formula based on SCr of 1.37 mg/dL (H)).  Liver Function Tests: Recent Labs  Lab 11/24/20 2119  AST 32  ALT 27  ALKPHOS 64  BILITOT 0.6  PROT 7.0  ALBUMIN 3.6    CBG: Recent Labs  Lab 11/26/20 2346 11/27/20 0152 11/27/20 0359 11/27/20 0603 11/27/20 0814  GLUCAP 355* 302* 276* 264* 239*     Recent Results (from the past 240 hour(s))  Urine Culture     Status: Abnormal   Collection Time: 11/24/20  9:19 PM   Specimen: In/Out Cath Urine  Result Value Ref Range Status   Specimen Description IN/OUT CATH URINE  Final   Special Requests   Final    NONE Performed at [facility], [address], [place], [place] [address]    Culture (A)  Final    10,000 COLONIES/mL MULTIPLE SPECIES PRESENT, SUGGEST RECOLLECTION   Report Status 11/26/2020 FINAL  Final  Blood culture (routine single)     Status: None (Preliminary result)   Collection Time: 11/24/20  9:46 PM   Specimen: BLOOD  Result Value Ref Range Status   Specimen Description BLOOD SITE NOT SPECIFIED  Final    Special Requests   Final    BOTTLES DRAWN AEROBIC AND ANAEROBIC Blood Culture adequate volume   Culture   Final    NO GROWTH 2 DAYS Performed at [facility], [place] [address], [place], [place] [address]    Report Status PENDING  Incomplete  Culture, blood (single)     Status: None (Preliminary result)   Collection Time: 11/24/20  9:55 PM   Specimen: BLOOD  Result Value Ref Range Status   Specimen Description BLOOD SITE NOT SPECIFIED  Final   Special Requests   Final    BOTTLES DRAWN AEROBIC AND ANAEROBIC Blood Culture adequate volume   Culture   Final    NO GROWTH 2 DAYS Performed at [facility], [address], [place], [place] [address]    Report Status PENDING  Incomplete         Radiology Studies: DG Swallowing Func-Speech Pathology  Result Date: 11/26/2020 Formatting of this result is different from the original. Objective Swallowing Evaluation: Type of Study: MBS-Modified Barium Swallow Study  Patient Details Name: Christine Benitez MRN: [id number] Date of Birth: 08-31-1960 Today's Date: 11/26/2020 Time: SLP Start Time (ACUTE ONLY): 1215 -SLP Stop Time (ACUTE ONLY): 1227 SLP Time Calculation (min) (ACUTE ONLY): 12 min Past Medical History: Past Medical History: Diagnosis Date  Abnormal LFTs   a. 05/2014 -  ALT 52, alk phos 130.  Chronic systolic CHF (congestive heart failure) ([place])   a. Dx 05/2014 - echo at [facility] - EF 30-35%, moderate LVH, no rWMA, mild to moderate MR, moderate PH with PA pressure 60-37mHg, mild to moderate pericardial effusion. EF 30-35% by cath.  History of blood transfusion 1986  "related to C-section"  Hypertension   Insulin dependent diabetes mellitus   Iron deficiency anemia  NICM (nonischemic cardiomyopathy) ([place])   a. [place] 06/11/14:  minor nonobstructive CAD with mild irregularities in the LAD less than 10-20%, LCx with mild disease less than 20%, no significant disease in the RCA, EF 30-35%.  Pericardial effusion   a. Mild-moderate pericardial  effusion by echo at [facility].  Pulmonary hypertension ([place])   a. Moderate PH by echo at [facility].  Tobacco abuse  Past Surgical History: Past Surgical History: Procedure Laterality Date  APPENDECTOMY  ~ 2003  [place] WITH CORONARY ANGIOGRAM N/A 06/11/2014  Procedure: LEFT HEART CATHETERIZATION WITH CORONARY ANGIOGRAM;  Surgeon: Peter M Martinique, MD;  Location[facility] CATH LAB;  Service: Cardiovascular;  Laterality: N/A; HPI: Pt is a 60 y.o. female who presented to the ED via EMS due to unresponsiveness and severe hypoglycemia. CT chest: Multifocal right lung pneumonia. PMH: diabetes, CVA, nonischemic cardiomyopathy, pulmonary hypertension, hypertension. Pt was alst seen by SLP in [place], 2021 at which time she was on a dysphgia 2 diet and thin liquids.  No data recorded Assessment / Plan / Recommendation CHL IP CLINICAL IMPRESSIONS 11/26/2020 Clinical Impression Pt was seen in radiology suite for modified barium swallow study. Trials of puree solids, regular texture solids, a 2m barium tablet, and thin liquids via cup and straw were administered. Pt's oropharyngeal swallow mechanism was within functional limits. It is recommended that a regular texture diet with thin liquids be continued at this time to maximize pt's options. Further skilled SLP services are not clinically indicated at this time. SLP Visit Diagnosis Dysphagia, unspecified (R13.10) Attention and concentration deficit following -- Frontal lobe and executive function deficit following -- Impact on safety and function Mild aspiration risk   CHL IP TREATMENT RECOMMENDATION 11/26/2020 Treatment Recommendations Therapy as outlined in treatment plan below   Prognosis 11/26/2020 Prognosis for Safe Diet Advancement Good Barriers to Reach Goals -- Barriers/Prognosis Comment -- CHL IP DIET RECOMMENDATION 11/26/2020 SLP Diet Recommendations Regular solids;Thin liquid Liquid Administration via Cup;Straw Medication Administration  Whole meds with liquid Compensations Slow rate;Small sips/bites Postural Changes --   CHL IP OTHER RECOMMENDATIONS 11/26/2020 Recommended Consults -- Oral Care Recommendations Oral care BID Other Recommendations --   CHL IP FOLLOW UP RECOMMENDATIONS 11/26/2020 Follow up Recommendations None   CHL IP FREQUENCY AND DURATION 11/26/2020 Speech Therapy Frequency (ACUTE ONLY) min 1 x/week Treatment Duration 1 week      CHL IP ORAL PHASE 11/26/2020 Oral Phase WFL Oral - Pudding Teaspoon -- Oral - Pudding Cup -- Oral - Honey Teaspoon -- Oral - Honey Cup -- Oral - Nectar Teaspoon -- Oral - Nectar Cup -- Oral - Nectar Straw -- Oral - Thin Teaspoon -- Oral - Thin Cup -- Oral - Thin Straw -- Oral - Puree -- Oral - Mech Soft -- Oral - Regular -- Oral - Multi-Consistency -- Oral - Pill -- Oral Phase - Comment --  CHL IP PHARYNGEAL PHASE 11/26/2020 Pharyngeal Phase WFL Pharyngeal- Pudding Teaspoon -- Pharyngeal -- Pharyngeal- Pudding Cup -- Pharyngeal -- Pharyngeal- Honey Teaspoon -- Pharyngeal -- Pharyngeal- Honey Cup -- Pharyngeal -- Pharyngeal- Nectar Teaspoon -- Pharyngeal -- Pharyngeal- Nectar Cup -- Pharyngeal -- Pharyngeal- Nectar Straw -- Pharyngeal -- Pharyngeal- Thin Teaspoon -- Pharyngeal -- Pharyngeal- Thin Cup Penetration/Aspiration during swallow Pharyngeal Material enters airway, remains ABOVE vocal cords then ejected out Pharyngeal- Thin Straw Penetration/Aspiration during swallow Pharyngeal Material enters airway, remains ABOVE vocal cords then ejected out Pharyngeal- Puree -- Pharyngeal -- Pharyngeal- Mechanical Soft -- Pharyngeal -- Pharyngeal- Regular -- Pharyngeal --  Pharyngeal- Multi-consistency -- Pharyngeal -- Pharyngeal- Pill Penetration/Aspiration during swallow Pharyngeal Material enters airway, remains ABOVE vocal cords then ejected out Pharyngeal Comment --  Shanika I. Hardin Negus, [place], [place] Office number [phone] Pager [phone] Horton Marshall 11/26/2020, 1:24 PM                    Scheduled Meds:  aspirin EC  81 mg Oral Daily   clopidogrel  75 mg Oral Daily   dronabinol  5 mg Oral BID AC   enoxaparin (LOVENOX) injection  40 mg Subcutaneous Q24H   feeding supplement  237 mL Oral TID BM   ivabradine  2.5 mg Oral BID WC   mirtazapine  7.5 mg Oral QHS   multivitamin with minerals  1 tablet Oral Daily   [START ON 11/28/2020] thiamine injection  100 mg Intravenous Daily   Continuous Infusions:  azithromycin Stopped (11/26/20 2316)   cefTRIAXone (ROCEPHIN)  IV Stopped (11/26/20 2212)   dextrose 5 % and 0.45% NaCl 50 mL/hr at 11/27/20 0400   thiamine injection 500 mg (11/27/20 0518)     LOS: 2 days     Kayleen Memos, MD [facility]   To contact the attending provider between 7A-7P or the covering provider during after hours 7P-7A, please log into the web site [email or web address] and access using universal [place] password for that web site. If you do not have the password, please call the hospital operator.  11/27/2020, 9:05 AM

## 2020-11-28 ENCOUNTER — Other Ambulatory Visit (HOSPITAL_COMMUNITY): Payer: Self-pay

## 2020-11-28 LAB — GLUCOSE, CAPILLARY
Glucose-Capillary: 287 mg/dL — ABNORMAL HIGH (ref 70–99)
Glucose-Capillary: 322 mg/dL — ABNORMAL HIGH (ref 70–99)
Glucose-Capillary: 326 mg/dL — ABNORMAL HIGH (ref 70–99)
Glucose-Capillary: 341 mg/dL — ABNORMAL HIGH (ref 70–99)
Glucose-Capillary: 346 mg/dL — ABNORMAL HIGH (ref 70–99)
Glucose-Capillary: 357 mg/dL — ABNORMAL HIGH (ref 70–99)
Glucose-Capillary: 364 mg/dL — ABNORMAL HIGH (ref 70–99)
Glucose-Capillary: 393 mg/dL — ABNORMAL HIGH (ref 70–99)
Glucose-Capillary: 430 mg/dL — ABNORMAL HIGH (ref 70–99)
Glucose-Capillary: 431 mg/dL — ABNORMAL HIGH (ref 70–99)
Glucose-Capillary: 449 mg/dL — ABNORMAL HIGH (ref 70–99)

## 2020-11-28 MED ORDER — INSULIN ASPART PROT & ASPART (70-30 MIX) 100 UNIT/ML PEN
4.0000 [IU] | PEN_INJECTOR | Freq: Two times a day (BID) | SUBCUTANEOUS | 0 refills | Status: DC
  Filled 2020-11-28: qty 3, 30d supply, fill #0

## 2020-11-28 MED ORDER — SODIUM CHLORIDE 0.9 % IV SOLN: INTRAVENOUS | Status: DC | PRN

## 2020-11-28 MED ORDER — INSULIN ASPART 100 UNIT/ML IJ SOLN
0.0000 [IU] | INTRAMUSCULAR | Status: DC
Start: 2020-11-28 — End: 2020-11-29
  Administered 2020-11-28: 4 [IU] via SUBCUTANEOUS
  Administered 2020-11-28: 3 [IU] via SUBCUTANEOUS
  Administered 2020-11-29: 2 [IU] via SUBCUTANEOUS
  Administered 2020-11-29: 4 [IU] via SUBCUTANEOUS

## 2020-11-28 MED ORDER — SODIUM ZIRCONIUM CYCLOSILICATE 10 G PO PACK: 10.0000 g | PACK | Freq: Once | ORAL | Status: DC

## 2020-11-28 MED ORDER — PENTIPS 32G X 4 MM MISC
3 refills | Status: DC
  Filled 2020-11-28: qty 100, 30d supply, fill #0

## 2020-11-28 NOTE — TOC Initial Note (Signed)
Transition of Care [facility]) - Initial/Assessment Note    Patient Details  Name: Christine Benitez MRN: [id number] Date of Birth: [birth year]  Transition [facility]) CM/SW Contact:    Kermit Balo, RN Phone Number: 11/28/2020, 3:48 PM  Clinical Narrative:                 Plan is for patient to d/c home tomorrow. Pt is already active with [facility] for [facility] services and they will pick her back up at d/c.  Pt with orders for 3 in 1 and wheelchair. She has a walker at home.  3 in 1 will be delivered to the room per [employer] under charity services.  CM talked to [facility] about the wheelchair and they will provide this to the patient after she returns home. CM inquired with OT and they feel she will be ok at home without the wheelchair for a few days.  Daughter states she will need transport home: address: [address] in [place] following.  Expected Discharge Plan: Home w Home Health Services Barriers to Discharge: Inadequate or no insurance, Continued Medical Work up   Patient Goals and CMS Choice     Choice offered to / list presented to : Patient, Adult Children  Expected Discharge Plan and Services Expected Discharge Plan: Home w Home Health Services   Discharge Planning Services: CM Consult Post Acute Care Choice: Durable Medical Equipment, Home Health Living arrangements for the past 2 months: Skilled Nursing Facility                 DME Arranged: 3-N-1 DME Agency: [employer] Date DME Agency Contacted: 11/28/20   Representative spoke with at DME Agency: Velna Hatchet HH Arranged: RN, PT, OT [facility] Agency: [facility] Date [facility] Agency Contacted: 11/28/20   Representative spoke with at [facility] Agency: Misty Stanley  Prior Living Arrangements/Services Living arrangements for the past 2 months: Skilled Nursing Facility Lives with:: Adult Children Patient language and need for interpreter reviewed:: Yes Do you feel safe going back to the place where you live?: Yes       Need for Family Participation in Patient Care: Yes (Comment) Care giver support system in place?: Yes (comment) Current home services: DME (walker) Criminal Activity/Legal Involvement Pertinent to Current Situation/Hospitalization: No - Comment as needed  Activities of Daily Living      Permission Sought/Granted                  Emotional Assessment Appearance:: Appears stated age Attitude/Demeanor/Rapport:  (HOH) Affect (typically observed): Accepting Orientation: : Oriented to Self, Oriented to Place   Psych Involvement: No (comment)  Admission diagnosis:  Respiratory failure (HCC) [J96.90] Lactic acidosis [E87.2] Hypoglycemia [E16.2] Encephalopathy [G93.40] Acute respiratory failure with hypoxia (HCC) [J96.01] Sepsis (HCC) [A41.9] Severe sepsis (HCC) [A41.9, R65.20] Sepsis due to pneumonia (HCC) [J18.9, A41.9] Patient Active Problem List   Diagnosis Date Noted   Severe sepsis (HCC) 11/25/2020   Sepsis due to pneumonia (HCC)    Hyperkalemia 11/11/2020   Constipation 11/03/2020   Cachexia (HCC) 11/03/2020   Malnutrition of moderate degree 11/03/2020   Hypoglycemia 11/02/2020   Proteus mirabilis infection 10/25/2020   Acute lower UTI 10/24/2020   Urinary tract infection 10/24/2020   Failure to thrive in adult 10/24/2020   Pericardial effusion    Iron deficiency anemia    Chronic systolic CHF (congestive heart failure) (HCC)    Heart failure with reduced ejection fraction (HCC) 07/18/2020   History of CVA (cerebrovascular accident)  07/18/2020   Nonischemic cardiomyopathy (HCC) 07/18/2020   Type 2 diabetes mellitus with complication, with long-term current use of insulin (HCC) 07/18/2020   Tobacco use 07/18/2020   Mixed hyperlipidemia 07/18/2020   Abnormal echocardiogram 07/18/2020   Resides in long term care facility 06/20/2020   Protein-calorie malnutrition, severe 03/24/2020   Pain    Altered mental status    DNR (do not resuscitate)    DNI (do not  intubate)    Encounter for hospice care discussion    Goals of care, counseling/discussion    Palliative care by specialist    AKI (acute kidney injury) (HCC)    Cardiogenic shock (HCC) 03/14/2020   Acute urinary retention 03/13/2020   Chronic cholecystitis with calculus 03/12/2020   Cerebral thrombosis with cerebral infarction 03/12/2020   Abnormal LFTs    Hypertension    NICM (nonischemic cardiomyopathy) (HCC)    Pulmonary hypertension (HCC)    Tobacco abuse 06/10/2014   Diabetes mellitus (HCC) 06/10/2014   Family history of premature CAD 06/10/2014   History of blood transfusion 1986   PCP:  Lupita Dawn, MD Pharmacy:   [facility] [phone] [place], [place] - [address] AT [facility] [address] [place] [address] Phone: [phone] Fax: [phone]  [facility] [address] [place] [address] Phone: [phone] Fax: [phone]     Social Determinants of Health (SDOH) Interventions    Readmission Risk Interventions Readmission Risk Prevention Plan 11/18/2020 11/06/2020  Transportation Screening Complete Complete  PCP or Specialist Appt within 5-7 Days - Not Complete  Not Complete comments - July 29  PCP or Specialist Appt within 3-5 Days Complete -  Home Care Screening - Complete  Medication Review (RN CM) - Complete  HRI or Home Care Consult Complete -  Social Work Consult for Recovery Care Planning/Counseling Complete -  Palliative Care Screening Complete -  Medication Review Oceanographer) Complete -  Some recent data might be hidden

## 2020-11-28 NOTE — Progress Notes (Signed)
Palliative: Ms. Haertel is lying quietly in bed. She appears acutely/chronically ill and frail. She will make but not keep I contact.  I ask how she is doing and she tells me that she is "weak".  Her lunch is in front of her, but she has not eaten.  She will take a few bites of food, but not attempt to feed herself. There is no family at bedside.   Call to daughter, Gitty Osterlund.  We talked about Mrs. Swartzendruber's acute health problems.  We talked about her weakness today.  We also talked about the treatment plan in detail including, but not limited to medicine such as IV antibiotics.  At this point, the tori's goal is for Mrs. Caul to return to their home with home health if possible.  They are to be active with [facility] palliative services.  They were referred, but had not been seen before Mrs. Vi was rehospitalized.  Plan: Continue to treat the treatable but no CPR or intubation.  Time for outcomes.  Goal is to return home with home health with assistance from daughter.  Active with [facility] outpatient palliative services, referral made, but not yet seen.  25 minutes  Lillia Carmel, NP Palliative medicine team Team phone [phone] Greater than 50% of this time was spent counseling and coordinating care related to the above assessment and plan.

## 2020-11-28 NOTE — Evaluation (Signed)
Occupational Therapy Evaluation Patient Details Name: Christine Benitez MRN: [id number] DOB: 09-06-1960 Today's Date: 11/28/2020    History of Present Illness 59yo female admitted 11/24/20 due to unresponsiveness at home and CBG 26. Did receive CPR from family prior to EMS arrival. O2 65% on room air in the ED, required NRB to recover. Found to be in severe sepsis and acute hypoxic respiratory failure secondary to pneumonia. PMH CHF, HTN, DM, NICM, tobacco abuse   Clinical Impression   Christine Benitez is a 60 year old woman with above medical history who presents with generalized weakness, mild left sided hemiparesis, cognitive deficits, impaired balance and decreased active tolerance. Patient requiring min guard for ambulation and ADLs. Expect patient is near her baseline. Patient up in chair and requesting to get back into bed so evaluation and activity somewhat limited. Patient able to ambulate around bed and stand at sink to wash hands and demonstrated ability to don socks in a seated position. Will follow patient acutely to promote more activity and independence in the hospital setting in order to be able to go home at discharge.     Follow Up Recommendations  Home health OT;Supervision/Assistance - 24 hour    Equipment Recommendations  None recommended by OT    Recommendations for Other Services       Precautions / Restrictions Precautions Precautions: Fall Precaution Comments: HOH, decreased vision Restrictions Weight Bearing Restrictions: No      Mobility Bed Mobility Overal bed mobility: Needs Assistance Bed Mobility: Sit to Supine       Sit to supine: Supervision   General bed mobility comments: able to transittion in to supine without assistance    Transfers Overall transfer level: Needs assistance Equipment used: Rolling walker (2 wheeled) Transfers: Sit to/from [employer] Sit to Stand: Min guard Stand pivot transfers: Min guard        General transfer comment: Min guard to ambulate with RW around bed to sink.    Balance   Sitting-balance support: No upper extremity supported;Feet supported Sitting balance-Leahy Scale: Good     Standing balance support: During functional activity Standing balance-Leahy Scale: Fair Standing balance comment: able to take hands off walker for grooming task                           ADL either performed or assessed with clinical judgement   ADL Overall ADL's : Needs assistance/impaired Eating/Feeding: Set up;Sitting   Grooming: Standing;Wash/dry hands;Min guard Grooming Details (indicate cue type and reason): stood at sink to wash hands with min guard; only needed to point out soap dispenser Upper Body Bathing: Set up;Sitting   Lower Body Bathing: Min guard;Sit to/from stand   Upper Body Dressing : Set up;Sitting   Lower Body Dressing: Min guard;Sit to/from stand Lower Body Dressing Details (indicate cue type and reason): donned socks in chair Toilet Transfer: Min guard;RW;Ambulation   Toileting- Clothing Manipulation and Hygiene: Min guard;Sit to/from stand Toileting - Clothing Manipulation Details (indicate cue type and reason): min guard for clothing management - but nursing using purewick     Functional mobility during ADLs: Min guard;Rolling walker       Vision Patient Visual Report: No change from baseline Vision Assessment?: No apparent visual deficits     Perception     Praxis      Pertinent Vitals/Pain Pain Assessment: No/denies pain     Hand Dominance Right   Extremity/Trunk Assessment Upper Extremity Assessment Upper  Extremity Assessment: RUE deficits/detail;LUE deficits/detail RUE Deficits / Details: WFL ROM, grossly 4/5 strength RUE Sensation: WNL RUE Coordination: WNL LUE Deficits / Details: WFL ROM, grossly 4/5 strength - hx of left sided weakness from stroke but no focal deficits LUE Sensation:  (appears functional) LUE  Coordination: WNL   Lower Extremity Assessment Lower Extremity Assessment: Defer to PT evaluation   Cervical / Trunk Assessment Cervical / Trunk Assessment: Kyphotic   Communication Communication Communication: HOH   Cognition Arousal/Alertness: Awake/alert Behavior During Therapy: Flat affect Overall Cognitive Status: History of cognitive impairments - at baseline                                 General Comments: Appears to be at her baseline - chart reports hx of cognitive deficits. Patient able to state her name and place. Able to follow all commands. Hard of hearing so commands and questions needed to be repeated.   General Comments       Exercises     Shoulder Instructions      Home Living Family/patient expects to be discharged to:: Private residence Living Arrangements: Children Available Help at Discharge: Family;Available PRN/intermittently Type of Home: House Home Access: Ramped entrance     Home Layout: One level     Bathroom Shower/Tub: Chief Strategy Officer: Standard         Additional Comments: pt very poor historian- all PLOF/DME information taken from prior charting      Prior Functioning/Environment Level of Independence: Independent with assistive device(s)        Comments: Pt reports walking with RW and completing ADL by herself, she manages her own meds        OT Problem List: Decreased cognition;Decreased coordination;Impaired balance (sitting and/or standing);Decreased activity tolerance;Decreased knowledge of use of DME or AE      OT Treatment/Interventions: Self-care/ADL training;Patient/family education;Balance training;Therapeutic activities;DME and/or AE instruction    OT Goals(Current goals can be found in the care plan section) Acute Rehab OT Goals Patient Stated Goal: to go home OT Goal Formulation: With patient Time For Goal Achievement: 11/19/20 Potential to Achieve Goals: Good  OT Frequency:  Min 2X/week   Barriers to D/C:            Co-evaluation              AM-PAC OT "6 Clicks" Daily Activity     Outcome Measure Help from another person eating meals?: A Little Help from another person taking care of personal grooming?: A Little Help from another person toileting, which includes using toliet, bedpan, or urinal?: A Little Help from another person bathing (including washing, rinsing, drying)?: A Little Help from another person to put on and taking off regular upper body clothing?: A Little Help from another person to put on and taking off regular lower body clothing?: A Little 6 Click Score: 18   End of Session Equipment Utilized During Treatment: Rolling walker Nurse Communication: Mobility status  Activity Tolerance: Patient tolerated treatment well Patient left: in bed;with call bell/phone within reach;with bed alarm set  OT Visit Diagnosis: Muscle weakness (generalized) (M62.81)                Time: 2130-8657 OT Time Calculation (min): 17 min Charges:  OT General Charges $OT Visit: 1 Visit OT Evaluation $OT Eval Low Complexity: 1 Low  Christine Benitez, OTR/[facility]  Office [phone] Pager: [phone]  Christine Benitez Christine Benitez Christine Benitez 11/28/2020, 10:20 AM

## 2020-11-28 NOTE — Progress Notes (Addendum)
PROGRESS NOTE    Christine Benitez  IWL:[id number] DOB: 1960-09-27 DOA: 11/24/2020 PCP: Christine Dawn, MD    Chief Complaint  Patient presents with   Hypoglycemia    Brief Narrative:  Christine Benitez is a 60 y.o. female with medical history significant for type 1? Type 2? diabetes, prior CVA, nonischemic cardiomyopathy, pulmonary hypertension, hypertension, who presented to [facility] ED from home via EMS due to unresponsiveness and severe hypoglycemia with CBG 26.  History is mainly obtained from patient's mother who is also her medical power of attorney via phone.  The patient lives with her adult daughter, and a nephew who is visiting and assisting with her care.  Around 11:30 AM on 11/24/20 her mother went to visit her at home she would not wake up.  Her mother left.  She called to find out how the patient was doing.  She was told that the patient was still lethargic and was becoming unresponsive.  Her daughter called 911 and per the patient's mother was told to do CPR/chest compressions until EMS arrives.  Unclear how long she had chest compressions.  Upon EMS arrival the patient was severely hypoglycemic with CBG of 26.  She received 125 cc of D10 with improvement of CBG 202.  Not sure if they had informed EMS of the CPR being performed.  Patient was brought to the ED for further evaluation.  She presented with an I.O. access in her right leg, she was confused and pulling at lines, soft restraints were placed in upper extremities bilaterally for patient's safety.  Initial O2 saturation 65% on room air.  On nonrebreather she went up to 96%.  She was weaned down to 4 L.  Work-up in the ED revealed right-sided pneumonia.  Per her mother, she was fine prior to this event.  Suspected that she took too much insulin.  Unclear if she administered the insulin herself or with assistance.  TRH, hospitalist team, was asked to admit.   She's been admitted with sepsis due to pneumonia and hypoglycemia.  11/28/2020: She  is more alert.  Eating small quantities.  Hyperglycemic with serum glucose in the 300's.  Dced D10.  Started novolog q6H.  Appreciate diabetes coordinator's assistance.   Assessment & Plan:   Active Problems:   Severe sepsis (HCC)  Goals of Care  Failure to thrive A prior hospitalizations seen by palliative with discussion of hospice Palliative medicine is following Continue marinol, remeron  Encourage oral intake  Sepsis, resolving, 2/2 Community Acquired Pneumonia Completed 5 days of ceftriaxone, azithromycin on 11/28/20 Blood cx negative to date Repeat procalcitonin tomorrow  AKI on CKD 2 suspect prerenal in the setting of poor oral intake. Baseline creatinine appears to be 1.0 with GFR of greater than 60. Uptrending creatinine 1.37 on 11/27/2020 She had received IV fluid hydration Will repeat BMP tomorrow. Monitor urine output. Avoid nephrotoxic agents, dehydration and hypotension.  Hypokalemia likely secondary to renal insufficiency Serum potassium 5.6 on 11/27/2020 1 dose of Lokelma ordered. Patient is on Lokelma 10 g twice daily at home.  Resolved Hypoglycemia  T2DM, now hyperglycemic Recent discharge 7/12 for similar issue, thought 2/2 inadequate oral intake -he had cosyntropin test done with good response 4.7 >> 21>> 26, so there is no evidence of renal insufficiency. -Does appear daughter has been giving her higher dose of insulin, due to lack of education, apparently patient been getting 20 units of Lantus twice daily, NovoLog 20 TID, which is much higher than what she is supposed  to be taking, she will need educated clearly upon discharge about the appropriate dose. D10 was discontinued and D5 1/2NS Dced on 11/28/20.  Resolved Lactic Acidosis  NAGMA Dced IV fluid Encourage oral intake to avoid dehydration  Nonischemic Cardiomyopathy 10/2020 with EF 50-55%, no RWMA, grade 1 diastolic dysfunction (improved from 03/2020 - EF 20-25%) Continue corlanor Strict  I&O Daily weight   Hx CVA Hx L sided weakness and cognitive impairment DAPT, lipitor  Resolved Acute Metabolic Encephalopathy Delirium precautions Thiamine  Resolved QTC prolongation  DVT prophylaxis: SCD Code Status: DNR Family Communication: none at bedside- daughter  over phone Disposition:   Status is: Inpatient  Remains inpatient appropriate because:Inpatient level of care appropriate due to severity of illness  Dispo: The patient is from: Home              Anticipated d/c is to: Home, likely on 11/29/20              Patient currently is not medically stable to d/c.   Difficult to place patient No       Consultants:  none  Procedures:  none  Antimicrobials: Anti-infectives (From admission, onward)    Start     Dose/Rate Route Frequency Ordered Stop   11/24/20 2200  cefTRIAXone (ROCEPHIN) 2 g in sodium chloride 0.9 % 100 mL IVPB        2 g 200 mL/hr over 30 Minutes Intravenous Every 24 hours 11/24/20 2146     11/24/20 2200  azithromycin (ZITHROMAX) 500 mg in sodium chloride 0.9 % 250 mL IVPB        500 mg 250 mL/hr over 60 Minutes Intravenous Every 24 hours 11/24/20 2146         Objective: Vitals:   11/28/20 0413 11/28/20 0724 11/28/20 1237 11/28/20 1601  BP: 138/88 (!) 142/91 (!) 149/91 133/86  Pulse: 100 100 100 93  Resp: (!) 22 20 20  (!) 23  Temp: (!) 97.4 F (36.3 C) 97.8 F (36.6 C) 97.8 F (36.6 C) 98.7 F (37.1 C)  TempSrc: Axillary Axillary Axillary Oral  SpO2: 93% 94% 98% 93%  Weight:      Height:        Intake/Output Summary (Last 24 hours) at 11/28/2020 1654 Last data filed at 11/28/2020 0700 Gross per 24 hour  Intake 661.64 ml  Output 1100 ml  Net -438.36 ml   Filed Weights   11/26/20 0500 11/27/20 0500 11/28/20 0339  Weight: 48.3 kg 48.7 kg 47.6 kg    Examination:  General: Frail appearing, more alert today.  Interacts at times. Respiratory: Clear to auscultation no wheezes or rales.  Poor inspiratory effort. Cardiac:  Regular rate and rhythm no rubs or gallops.  No JVD or thyromegaly noted. GI: Nondistended normal bowel sounds present.  Nontender to palpation.  Musculoskeletal: No edema noted in lower extremities bilaterally. Skin: No rashes noted Neuro: No acute focal motor or sensory deficits.    Data Reviewed: I have personally reviewed following labs and imaging studies  CBC: Recent Labs  Lab 11/24/20 2119 11/25/20 0128 11/25/20 0153 11/25/20 0909 11/26/20 0530 11/27/20 0317  WBC 11.1*  --   --   --  14.0* 13.2*  NEUTROABS 9.4*  --   --   --   --   --   HGB 10.5* 10.5* 10.5* 10.5* 8.8* 8.4*  HCT 34.6* 31.0* 31.0* 31.0* 27.3* 26.8*  MCV 103.0*  --   --   --  98.6 100.8*  PLT 343  --   --   --  242 245    Basic Metabolic Panel: Recent Labs  Lab 11/25/20 0229 11/25/20 0853 11/25/20 0909 11/25/20 1520 11/26/20 0530 11/27/20 0317  NA 137 137 140 136 140 138  K 4.5 4.3 4.3 4.1 4.4 5.6*  CL 106 104  --  103 104 104  CO2 24 26  --  21* 27 23  GLUCOSE 320* 189*  --  195* 125* 301*  BUN 13 13  --  10 11 12   CREATININE 1.07* 1.05*  --  1.06* 1.13* 1.37*  CALCIUM 9.1 9.2  --  9.2 9.2 9.1    GFR: Estimated Creatinine Clearance: 33.6 mL/min (A) (by C-G formula based on SCr of 1.37 mg/dL (H)).  Liver Function Tests: Recent Labs  Lab 11/24/20 2119  AST 32  ALT 27  ALKPHOS 64  BILITOT 0.6  PROT 7.0  ALBUMIN 3.6    CBG: Recent Labs  Lab 11/28/20 0752 11/28/20 1036 11/28/20 1204 11/28/20 1436 11/28/20 1606  GLUCAP 393* 364* 341* 357* 322*     Recent Results (from the past 240 hour(s))  Urine Culture     Status: Abnormal   Collection Time: 11/24/20  9:19 PM   Specimen: In/Out Cath Urine  Result Value Ref Range Status   Specimen Description IN/OUT CATH URINE  Final   Special Requests   Final    NONE Performed at [facility], [address], [place], [place]    Culture (A)  Final    10,000 COLONIES/mL MULTIPLE SPECIES PRESENT, SUGGEST RECOLLECTION    Report Status 11/26/2020 FINAL  Final  Blood culture (routine single)     Status: None (Preliminary result)   Collection Time: 11/24/20  9:46 PM   Specimen: BLOOD  Result Value Ref Range Status   Specimen Description BLOOD SITE NOT SPECIFIED  Final   Special Requests   Final    BOTTLES DRAWN AEROBIC AND ANAEROBIC Blood Culture adequate volume   Culture   Final    NO GROWTH 4 DAYS Performed at [facility], [address] [place], [place]    Report Status PENDING  Incomplete  Culture, blood (single)     Status: None (Preliminary result)   Collection Time: 11/24/20  9:55 PM   Specimen: BLOOD  Result Value Ref Range Status   Specimen Description BLOOD SITE NOT SPECIFIED  Final   Special Requests   Final    BOTTLES DRAWN AEROBIC AND ANAEROBIC Blood Culture adequate volume   Culture   Final    NO GROWTH 4 DAYS Performed at [facility], [address] [place], [place]    Report Status PENDING  Incomplete         Radiology Studies: No results found.      Scheduled Meds:  aspirin EC  81 mg Oral Daily   clopidogrel  75 mg Oral Daily   dronabinol  5 mg Oral BID AC   enoxaparin (LOVENOX) injection  40 mg Subcutaneous Q24H   feeding supplement  237 mL Oral TID BM   insulin aspart  0-6 Units Subcutaneous Q4H   ivabradine  2.5 mg Oral BID WC   mirtazapine  7.5 mg Oral QHS   multivitamin with minerals  1 tablet Oral Daily   sodium zirconium cyclosilicate  10 g Oral Once   thiamine injection  100 mg Intravenous Daily   Continuous Infusions:  sodium chloride     azithromycin Stopped (11/28/20 0038)   cefTRIAXone (ROCEPHIN)  IV  Stopped (11/27/20 2332)     LOS: 3 days     Darlin Drop, MD [facility]   To contact the attending provider between 7A-7P or the covering provider during after hours 7P-7A, please log into the web site [email or web address] and access using universal [place] password for that web site. If you do not have  the password, please call the hospital operator.  11/28/2020, 4:54 PM

## 2020-11-28 NOTE — Progress Notes (Signed)
Wheelchair:   Patient suffers from weakness which impairs their ability to perform daily activities like grooming in the home.  A walker will not resolve  issue with performing activities of daily living. A wheelchair will allow patient to safely perform daily activities. Patient is not able to propel themselves in the home using a standard weight wheelchair due to general weakness. Patient can self propel in the lightweight wheelchair. Length of need 6 months .  Accessories: elevating leg rests (ELRs), wheel locks, extensions and anti-tippers.

## 2020-11-28 NOTE — Progress Notes (Signed)
Inpatient Diabetes Program Recommendations  AACE/ADA: New Consensus Statement on Inpatient Glycemic Control   Target Ranges:  Prepandial:   less than 140 mg/dL      Peak postprandial:   less than 180 mg/dL (1-2 hours)      Critically ill patients:  140 - 180 mg/dL  Results for TASHAUNA, CAISSE (MRN [id number]) as of 11/28/2020 10:40  Ref. Range 11/28/2020 02:03 11/28/2020 04:20 11/28/2020 05:55 11/28/2020 07:52 11/28/2020 10:36  Glucose-Capillary Latest Ref Range: 70 - 99 mg/dL 811 (H) 031 (H) 594 (H) 393 (H) 364 (H)    Results for AYVAH, CAROLL (MRN [id number]) as of 11/28/2020 10:40  Ref. Range 11/27/2020 08:14 11/27/2020 09:59 11/27/2020 12:04 11/27/2020 14:20 11/27/2020 16:13 11/27/2020 18:08 11/27/2020 20:27 11/27/2020 21:44 11/27/2020 23:41  Glucose-Capillary Latest Ref Range: 70 - 99 mg/dL 628 (H) 638 (H) 177 (H) 316 (H) 459 (H) 423 (H) 388 (H) 339 (H) 343 (H)   Review of Glycemic Control  Diabetes history: DM2 Outpatient Diabetes medications: Lantus 10 units BID, Novolog 8 units TID Current orders for Inpatient glycemic control: CBG monitoring  Inpatient Diabetes Program Recommendations:    Insulin: Glucose ranged from 239-459 mg/dl on 1/16 and 579-038 mg/dl today. No insulin given in the past 24 hours.  Please consider ordering Novolog 0-6 units Q4H.  Thanks, Orlando Penner, RN, MSN, CDE Diabetes Coordinator Inpatient Diabetes Program [phone] (Team Pager from 8am to 5pm)

## 2020-11-29 LAB — BASIC METABOLIC PANEL
Anion gap: 9 (ref 5–15)
BUN: 15 mg/dL (ref 6–20)
CO2: 28 mmol/L (ref 22–32)
Calcium: 9.3 mg/dL (ref 8.9–10.3)
Chloride: 99 mmol/L (ref 98–111)
Creatinine, Ser: 1.36 mg/dL — ABNORMAL HIGH (ref 0.44–1.00)
GFR, Estimated: 45 mL/min — ABNORMAL LOW (ref >60)
Glucose, Bld: 345 mg/dL — ABNORMAL HIGH (ref 70–99)
Potassium: 5 mmol/L (ref 3.5–5.1)
Sodium: 136 mmol/L (ref 135–145)

## 2020-11-29 LAB — CULTURE, BLOOD (SINGLE)
Culture: NO GROWTH
Culture: NO GROWTH
Special Requests: ADEQUATE
Special Requests: ADEQUATE

## 2020-11-29 LAB — GLUCOSE, CAPILLARY
Glucose-Capillary: 230 mg/dL — ABNORMAL HIGH (ref 70–99)
Glucose-Capillary: 69 mg/dL — ABNORMAL LOW (ref 70–99)
Glucose-Capillary: 112 mg/dL — ABNORMAL HIGH (ref 70–99)
Glucose-Capillary: 374 mg/dL — ABNORMAL HIGH (ref 70–99)
Glucose-Capillary: 302 mg/dL — ABNORMAL HIGH (ref 70–99)
Glucose-Capillary: 262 mg/dL — ABNORMAL HIGH (ref 70–99)

## 2020-11-29 LAB — PROCALCITONIN: Procalcitonin: 1.94 ng/mL

## 2020-11-29 MED ORDER — INSULIN ASPART PROT & ASPART (70-30 MIX) 100 UNIT/ML ~~LOC~~ SUSP
4.0000 [IU] | Freq: Two times a day (BID) | SUBCUTANEOUS | Status: DC
  Administered 2020-11-29 – 2020-12-01 (×4): 4 [IU] via SUBCUTANEOUS
  Filled 2020-11-29: qty 10

## 2020-11-29 MED ORDER — SODIUM ZIRCONIUM CYCLOSILICATE 10 G PO PACK
10.0000 g | PACK | Freq: Every day | ORAL | Status: DC
  Administered 2020-11-29 – 2020-11-30 (×2): 10 g via ORAL
  Filled 2020-11-29 (×2): qty 1

## 2020-11-29 MED ORDER — THIAMINE HCL 100 MG PO TABS
100.0000 mg | ORAL_TABLET | Freq: Every day | ORAL | Status: DC
  Administered 2020-11-30 – 2020-12-03 (×4): 100 mg via ORAL
  Filled 2020-11-29 (×4): qty 1

## 2020-11-29 NOTE — Progress Notes (Signed)
Physical Therapy Treatment Patient Details Name: Christine Benitez MRN: [id number] DOB: [birth year] Today's Date: 11/29/2020    History of Present Illness 59yo female admitted 11/24/20 due to unresponsiveness at home and CBG 26. Did receive CPR from family prior to EMS arrival. O2 65% on room air in the ED, required NRB to recover. Found to be in severe sepsis and acute hypoxic respiratory failure secondary to pneumonia. PMH CHF, HTN, DM, NICM, tobacco abuse    PT Comments    Patient was limited today with mobility ( see transfer section). She refused to get to the chair after getting cleaned and refused to ambulate with therapy. Therapy will continue to encourage her to ambulate and mobilize while in the hospital.    Follow Up Recommendations  Home health PT;Supervision/Assistance - 24 hour;Other (comment)     Equipment Recommendations  Rolling walker with 5" wheels;3in1 (PT);Wheelchair (measurements PT);Wheelchair cushion (measurements PT)    Recommendations for Other Services       Precautions / Restrictions Precautions Precautions: Fall Precaution Comments: HOH, decreased vision Restrictions Weight Bearing Restrictions: No    Mobility  Bed Mobility Overal bed mobility: Needs Assistance Bed Mobility: Sit to Supine     Supine to sit: Min assist;HOB elevated Sit to supine: Supervision   General bed mobility comments: Min a to sit up at the edge of the bed. She was abble to get her self back into bed with supervision and cuing    Transfers Overall transfer level: Needs assistance Equipment used: Rolling walker (2 wheeled) Transfers: Sit to/from [employer] Sit to Stand: Min guard Stand pivot transfers: Min guard       General transfer comment: Patient stood to go to the bathroom. She stood but began urintaing in the pure wick. She was advised to continue urinating in the purewick but she istead tried to take it out and urnited on the floor and her self.  She made it to the commode and finished her urination. She transfered back to the bed to be cleaned up. By the time she was done getting cleaned , she refused to get to the chair and refuesed to ambualte with therapy., She reported back pain.  Ambulation/Gait                 Stairs             Wheelchair Mobility    Modified Rankin (Stroke Patients Only)       Balance                                            Cognition Arousal/Alertness: Awake/alert Behavior During Therapy: Flat affect Overall Cognitive Status: History of cognitive impairments - at baseline Area of Impairment: Attention;Memory;Following commands;Safety/judgement;Awareness;Problem solving;Orientation                 Orientation Level: Disoriented to;Situation Current Attention Level: Selective Memory: Decreased short-term memory Following Commands: Follows one step commands inconsistently;Follows one step commands with increased time Safety/Judgement: Decreased awareness of safety;Decreased awareness of deficits     General Comments: can follow some simple commands. Patient is hard of hearing.      Exercises      General Comments        Pertinent Vitals/Pain Pain Assessment: Faces Faces Pain Scale: Hurts little more Pain Location: reported lower back pain after sitting Pain Descriptors / Indicators: Aching;Grimacing  Pain Intervention(s): Limited activity within patient's tolerance;Monitored during session;Repositioned    Home Living                      Prior Function            PT Goals (current goals can now be found in the care plan section) Acute Rehab PT Goals Patient Stated Goal: to go home PT Goal Formulation: With patient Time For Goal Achievement: 12/11/20 Potential to Achieve Goals: Good Progress towards PT goals: Progressing toward goals    Frequency    Min 3X/week      PT Plan Current plan remains appropriate     Co-evaluation              AM-PAC PT "6 Clicks" Mobility   Outcome Measure  Help needed turning from your back to your side while in a flat bed without using bedrails?: A Little Help needed moving from lying on your back to sitting on the side of a flat bed without using bedrails?: A Little Help needed moving to and from a bed to a chair (including a wheelchair)?: A Lot Help needed standing up from a chair using your arms (e.g., wheelchair or bedside chair)?: A Little Help needed to walk in hospital room?: A Lot Help needed climbing 3-5 steps with a railing? : A Lot 6 Click Score: 15    End of Session Equipment Utilized During Treatment: Gait belt Activity Tolerance: Patient tolerated treatment well Patient left: in chair;with call bell/phone within reach;with chair alarm set Nurse Communication: Mobility status PT Visit Diagnosis: Other abnormalities of gait and mobility (R26.89);Muscle weakness (generalized) (M62.81);Difficulty in walking, not elsewhere classified (R26.2)     Time: 1030-1105 PT Time Calculation (min) (ACUTE ONLY): 35 min  Charges:  $Therapeutic Activity: 23-37 mins                        Dessie Coma PT DPT  11/29/2020, 11:49 AM

## 2020-11-29 NOTE — Progress Notes (Signed)
PROGRESS NOTE    Christine Benitez  TMH:[id number] DOB: 02-20-1961 DOA: 11/24/2020 PCP: Lupita Dawn, MD    Chief Complaint  Patient presents with   Hypoglycemia    Brief Narrative:  Christine Benitez is a 60 y.o. female with medical history significant for type 1? Type 2? diabetes, prior CVA, nonischemic cardiomyopathy, pulmonary hypertension, hypertension, who presented to [facility] ED from home via EMS due to unresponsiveness and severe hypoglycemia with CBG 26.  History is mainly obtained from patient's mother who is also her medical power of attorney via phone.  The patient lives with her adult daughter, and a nephew who is visiting and assisting with her care.  Around 11:30 AM on 11/24/20 her mother went to visit her at home she would not wake up.  Her mother left.  She called to find out how the patient was doing.  She was told that the patient was still lethargic and was becoming unresponsive.  Her daughter called 911 and per the patient's mother was told to do CPR/chest compressions until EMS arrives.  Unclear how long she had chest compressions.  Upon EMS arrival the patient was severely hypoglycemic with CBG of 26.  She received 125 cc of D10 with improvement of CBG 202.  Not sure if they had informed EMS of the CPR being performed.  Patient was brought to the ED for further evaluation.  She presented with an I.O. access in her right leg, she was confused and pulling at lines, soft restraints were placed in upper extremities bilaterally for patient's safety.  Initial O2 saturation 65% on room air.  On nonrebreather she went up to 96%.  She was weaned down to 4 L.  Work-up in the ED revealed right-sided pneumonia.  Per her mother, she was fine prior to this event.  Suspected that she took too much insulin.  Unclear if she administered the insulin herself or with assistance.  TRH, hospitalist team, was asked to admit.   She's been admitted with sepsis due to pneumonia and hypoglycemia.  11/29/2020: She  was seen and examined at her bedside.  She was somnolent but easily arousable.  Hypoglycemic this morning with CBG 69.  Adjusted insulin dose to 4 mg NovoLog 70/30 twice daily.  Assessment & Plan:   Active Problems:   Severe sepsis (HCC)  Goals of Care  Failure to thrive A prior hospitalizations seen by palliative with discussion of hospice Palliative medicine is following Continue marinol, remeron  Encourage oral intake.  Recurrent hypoglycemia in the setting of inadequate oral intake This morning CBG 69.  Latest CBG 374, started NovoLog 70/30 4 unit twice daily. Continue to closely monitor CBGs. Recent discharge 7/12 for similar issue, thought 2/2 inadequate oral intake -Does appear daughter has been giving her higher dose of insulin, due to lack of education, apparently patient been getting 20 units of Lantus twice daily, NovoLog 20 TID, which is much higher than what she is supposed to be taking, she will need educated clearly upon discharge about the appropriate dose. D10 was discontinued and D5 1/2NS Dced on 11/28/20.  Hyperkalemia likely secondary to renal insufficiency Serum potassium 5.6 on 11/27/2020, improved after 1 dose of Lokelma down to 5.0. Resume home Lokelma.  Sepsis, resolving, 2/2 Community Acquired Pneumonia Completed 5 days of ceftriaxone, azithromycin on 11/28/20 Blood cx negative to date Procalcitonin downtrending 1.94 from 7.51.  AKI on CKD 2 suspect prerenal in the setting of poor oral intake. Baseline creatinine appears to be 1.0 with  GFR of greater than 60. Creatinine plateaued at 1.36 from 1.37 on 11/27/2020 She had received IV fluid hydration Monitor urine output. Avoid nephrotoxic agents, dehydration and hypotension.  Resolved Lactic Acidosis  NAGMA Dced IV fluid Encourage oral intake to avoid dehydration  Nonischemic Cardiomyopathy 10/2020 with EF 50-55%, no RWMA, grade 1 diastolic dysfunction (improved from 03/2020 - EF 20-25%) Continue  corlanor Strict I&O Daily weight   Hx CVA Hx L sided weakness and cognitive impairment DAPT, lipitor  Resolved Acute Metabolic Encephalopathy Delirium precautions Thiamine  Resolved QTC prolongation  DVT prophylaxis: Subcu Lovenox daily. Code Status: DNR Family Communication: none at bedside- daughter  over phone Disposition:   Status is: Inpatient  Remains inpatient appropriate because:Inpatient level of care appropriate due to severity of illness  Dispo: The patient is from: Home              Anticipated d/c is to: Home, likely on 11/30/20              Patient currently is not medically stable to d/c.   Difficult to place patient No       Consultants:  Palliative care team.  Procedures:  none  Antimicrobials: Anti-infectives (From admission, onward)    Start     Dose/Rate Route Frequency Ordered Stop   11/24/20 2200  cefTRIAXone (ROCEPHIN) 2 g in sodium chloride 0.9 % 100 mL IVPB        2 g 200 mL/hr over 30 Minutes Intravenous Every 24 hours 11/24/20 2146     11/24/20 2200  azithromycin (ZITHROMAX) 500 mg in sodium chloride 0.9 % 250 mL IVPB        500 mg 250 mL/hr over 60 Minutes Intravenous Every 24 hours 11/24/20 2146         Objective: Vitals:   11/28/20 2338 11/29/20 0400 11/29/20 0800 11/29/20 1201  BP: 118/81 133/90 134/87 111/69  Pulse: 97 97 97 100  Resp: 19 20 20 18   Temp: 98.6 F (37 C) 97.9 F (36.6 C) 98 F (36.7 C) 98.8 F (37.1 C)  TempSrc: Oral Axillary Oral Oral  SpO2: 90% 98% 91% 95%  Weight:  47.3 kg    Height:        Intake/Output Summary (Last 24 hours) at 11/29/2020 1606 Last data filed at 11/29/2020 0405 Gross per 24 hour  Intake 340 ml  Output 1100 ml  Net -760 ml   Filed Weights   11/27/20 0500 11/28/20 0339 11/29/20 0400  Weight: 48.7 kg 47.6 kg 47.3 kg    Examination:  General: Very frail-appearing no acute distress.  She is alert and interactive after she arouses from sleep. Respiratory: Clear to  auscultation no wheezes or rales.  Poor inspiratory effort.   Cardiac: Regular rate and rhythm no rubs or gallops.  No JVD or thyromegaly noted.  GI: Nontender nondistended normal bowel sounds present.   Musculoskeletal: No lower extremity edema bilaterally. Skin: No rashes noted. Neuro: No acute focal motor or sensory deficits.   Data Reviewed: I have personally reviewed following labs and imaging studies  CBC: Recent Labs  Lab 11/24/20 2119 11/25/20 0128 11/25/20 0153 11/25/20 0909 11/26/20 0530 11/27/20 0317  WBC 11.1*  --   --   --  14.0* 13.2*  NEUTROABS 9.4*  --   --   --   --   --   HGB 10.5* 10.5* 10.5* 10.5* 8.8* 8.4*  HCT 34.6* 31.0* 31.0* 31.0* 27.3* 26.8*  MCV 103.0*  --   --   --  98.6 100.8*  PLT 343  --   --   --  242 245    Basic Metabolic Panel: Recent Labs  Lab 11/25/20 0853 11/25/20 0909 11/25/20 1520 11/26/20 0530 11/27/20 0317 11/29/20 0022  NA 137 140 136 140 138 136  K 4.3 4.3 4.1 4.4 5.6* 5.0  CL 104  --  103 104 104 99  CO2 26  --  21* 27 23 28   GLUCOSE 189*  --  195* 125* 301* 345*  BUN 13  --  10 11 12 15   CREATININE 1.05*  --  1.06* 1.13* 1.37* 1.36*  CALCIUM 9.2  --  9.2 9.2 9.1 9.3    GFR: Estimated Creatinine Clearance: 33.7 mL/min (A) (by C-G formula based on SCr of 1.36 mg/dL (H)).  Liver Function Tests: Recent Labs  Lab 11/24/20 2119  AST 32  ALT 27  ALKPHOS 64  BILITOT 0.6  PROT 7.0  ALBUMIN 3.6    CBG: Recent Labs  Lab 11/28/20 2337 11/29/20 0402 11/29/20 0802 11/29/20 0842 11/29/20 1201  GLUCAP 326* 230* 69* 112* 374*     Recent Results (from the past 240 hour(s))  Urine Culture     Status: Abnormal   Collection Time: 11/24/20  9:19 PM   Specimen: In/Out Cath Urine  Result Value Ref Range Status   Specimen Description IN/OUT CATH URINE  Final   Special Requests   Final    NONE Performed at [facility], [address] [place], [address] [place]    Culture (A)  Final    10,000 COLONIES/mL  MULTIPLE SPECIES PRESENT, SUGGEST RECOLLECTION   Report Status 11/26/2020 FINAL  Final  Blood culture (routine single)     Status: None   Collection Time: 11/24/20  9:46 PM   Specimen: BLOOD  Result Value Ref Range Status   Specimen Description BLOOD SITE NOT SPECIFIED  Final   Special Requests   Final    BOTTLES DRAWN AEROBIC AND ANAEROBIC Blood Culture adequate volume   Culture   Final    NO GROWTH 5 DAYS Performed at [facility], [address] [place], [address] [place]    Report Status 11/29/2020 FINAL  Final  Culture, blood (single)     Status: None   Collection Time: 11/24/20  9:55 PM   Specimen: BLOOD  Result Value Ref Range Status   Specimen Description BLOOD SITE NOT SPECIFIED  Final   Special Requests   Final    BOTTLES DRAWN AEROBIC AND ANAEROBIC Blood Culture adequate volume   Culture   Final    NO GROWTH 5 DAYS Performed at [facility], [address] [place], [address] [place]    Report Status 11/29/2020 FINAL  Final         Radiology Studies: No results found.      Scheduled Meds:  aspirin EC  81 mg Oral Daily   clopidogrel  75 mg Oral Daily   dronabinol  5 mg Oral BID AC   enoxaparin (LOVENOX) injection  40 mg Subcutaneous Q24H   feeding supplement  237 mL Oral TID BM   insulin aspart protamine- aspart  4 Units Subcutaneous BID WC   ivabradine  2.5 mg Oral BID WC   mirtazapine  7.5 mg Oral QHS   multivitamin with minerals  1 tablet Oral Daily   sodium zirconium cyclosilicate  10 g Oral Once   [START ON 11/30/2020] thiamine  100 mg Oral Daily   Continuous Infusions:  sodium chloride     azithromycin 500 mg (11/28/20 2245)   cefTRIAXone (ROCEPHIN)  IV 2 g (11/28/20 2202)     LOS: 4 days     Darlin Drop, MD [facility]   To contact the attending provider between 7A-7P or the covering provider during after hours 7P-7A, please log into the web site [email or web address] and access using universal [place] password for  that web site. If you do not have the password, please call the hospital operator.  11/29/2020, 4:06 PM

## 2020-11-30 LAB — BASIC METABOLIC PANEL
Anion gap: 8 (ref 5–15)
BUN: 15 mg/dL (ref 6–20)
CO2: 27 mmol/L (ref 22–32)
Calcium: 9.3 mg/dL (ref 8.9–10.3)
Chloride: 100 mmol/L (ref 98–111)
Creatinine, Ser: 1.33 mg/dL — ABNORMAL HIGH (ref 0.44–1.00)
GFR, Estimated: 46 mL/min — ABNORMAL LOW (ref >60)
Glucose, Bld: 335 mg/dL — ABNORMAL HIGH (ref 70–99)
Potassium: 4.4 mmol/L (ref 3.5–5.1)
Sodium: 135 mmol/L (ref 135–145)

## 2020-11-30 LAB — GLUCOSE, CAPILLARY
Glucose-Capillary: 189 mg/dL — ABNORMAL HIGH (ref 70–99)
Glucose-Capillary: 192 mg/dL — ABNORMAL HIGH (ref 70–99)
Glucose-Capillary: 199 mg/dL — ABNORMAL HIGH (ref 70–99)
Glucose-Capillary: 232 mg/dL — ABNORMAL HIGH (ref 70–99)
Glucose-Capillary: 317 mg/dL — ABNORMAL HIGH (ref 70–99)
Glucose-Capillary: 376 mg/dL — ABNORMAL HIGH (ref 70–99)
Glucose-Capillary: 420 mg/dL — ABNORMAL HIGH (ref 70–99)
Glucose-Capillary: 80 mg/dL (ref 70–99)

## 2020-11-30 MED ORDER — INSULIN ASPART 100 UNIT/ML IJ SOLN
0.0000 [IU] | Freq: Every day | INTRAMUSCULAR | Status: DC
  Administered 2020-11-30: 5 [IU] via SUBCUTANEOUS
  Administered 2020-12-01: 2 [IU] via SUBCUTANEOUS

## 2020-11-30 MED ORDER — INSULIN ASPART 100 UNIT/ML IJ SOLN
0.0000 [IU] | Freq: Three times a day (TID) | INTRAMUSCULAR | Status: DC
  Administered 2020-11-30: 1 [IU] via SUBCUTANEOUS
  Administered 2020-11-30: 4 [IU] via SUBCUTANEOUS
  Administered 2020-12-01: 3 [IU] via SUBCUTANEOUS
  Administered 2020-12-01: 6 [IU] via SUBCUTANEOUS
  Administered 2020-12-01: 3 [IU] via SUBCUTANEOUS

## 2020-11-30 MED ORDER — INSULIN GLARGINE 100 UNIT/ML ~~LOC~~ SOLN
5.0000 [IU] | Freq: Two times a day (BID) | SUBCUTANEOUS | Status: DC
  Administered 2020-11-30 – 2020-12-02 (×6): 5 [IU] via SUBCUTANEOUS
  Filled 2020-11-30 (×8): qty 0.05

## 2020-11-30 NOTE — Progress Notes (Addendum)
PROGRESS NOTE    Christine Benitez  CMK:[id number] DOB: 05-26-1960 DOA: 11/24/2020 PCP: Lupita Dawn, MD    Chief Complaint  Patient presents with   Hypoglycemia    Brief Narrative:  Christine Benitez is a 60 y.o. female with medical history significant for type 2 diabetes, prior CVA, nonischemic cardiomyopathy, pulmonary hypertension, essential hypertension, who presented to [facility] ED from home via EMS due to unresponsiveness and severe hypoglycemia with CBG 26.  Upon presentation to the ED she was found to have a right-sided pneumonia and hypoglycemic, suspected that she was getting too much insulin at home.  She has been treated with 5 days of IV antibiotics.  Hospital course complicated by labile blood sugars.  Hyperglycemic then hypoglycemic.  Seen by diabetes coordinator.  She is currently on Lantus 5 units twice daily, NovoLog 70/30 4 units twice daily, and insulin sliding scale.  11/30/2020: She was seen and examined at her bedside.  She has no new complaints.  She is tolerating a diet.  Lowest CBG in the last 24 hours was 80.  Assessment & Plan:   Active Problems:   Severe sepsis (HCC)  Goals of Care  Failure to thrive Palliative medicine is following Continue marinol, remeron to stimulate her appetite. Encourage oral intake as tolerated.  Resolved hypoglycemia in the setting of inadequate oral intake Lowest CBG in the last 24 hours was 80. Morning of 11/29/2020 CBG 69.   Insulin adjustments were made as stated above. -Does appear daughter has been giving her higher dose of insulin, due to lack of education, apparently patient been getting 20 units of Lantus twice daily, NovoLog 20 TID, which is much higher than what she is supposed to be taking, she will need educated clearly upon discharge about the appropriate dose. D10 was discontinued.  D5 1/2NS was Dced on 11/28/20.  Resolved hyperkalemia likely secondary to renal insufficiency Serum potassium 5.6 on 11/27/2020, improved after  1 dose of Lokelma.   Serum potassium 4.4.  Sepsis, resolved 2/2 Community Acquired Pneumonia Completed 5 days of ceftriaxone, azithromycin on 11/28/20 Blood cx negative to date Procalcitonin downtrending 1.94 from 7.51.  AKI on CKD 2 suspect prerenal in the setting of poor oral intake. Baseline creatinine appears to be 1.0 with GFR of greater than 60. Creatinine plateaued at 1.33 from 1.36. She had received IV fluid hydration Avoid nephrotoxic agents, dehydration and hypotension.  Resolved Lactic Acidosis  NAGMA Dced IV fluid Encourage oral intake to avoid dehydration  Nonischemic Cardiomyopathy 10/2020 with EF 50-55%, no RWMA, grade 1 diastolic dysfunction (improved from 03/2020 - EF 20-25%) Continue corlanor Strict I&O Daily weight   Hx CVA Hx L sided weakness and cognitive impairment DAPT, lipitor  Resolved Acute Metabolic Encephalopathy Delirium precautions Thiamine  Resolved QTC prolongation  Ambulatory dysfunction PT assessed and recommended home health PT Continue PT OT with assistance and fall precautions TOC consulted to assist with home health services arrangement.   DVT prophylaxis: Subcu Lovenox daily. Code Status: DNR Family Communication: none at bedside- daughter  over phone Disposition:   Status is: Inpatient  Remains inpatient appropriate because:Inpatient level of care appropriate due to severity of illness  Dispo: The patient is from: Home              Anticipated d/c is to: Home, likely on 12/01/20 if blood sugars remain stable.              Patient currently is not medically stable to d/c.   Difficult to place  patient No       Consultants:  Palliative care team. Diabetes coordinator  Procedures:  none  Antimicrobials: Anti-infectives (From admission, onward)    Start     Dose/Rate Route Frequency Ordered Stop   11/24/20 2200  cefTRIAXone (ROCEPHIN) 2 g in sodium chloride 0.9 % 100 mL IVPB  Status:  Discontinued        2 g 200  mL/hr over 30 Minutes Intravenous Every 24 hours 11/24/20 2146 11/30/20 0726   11/24/20 2200  azithromycin (ZITHROMAX) 500 mg in sodium chloride 0.9 % 250 mL IVPB  Status:  Discontinued        500 mg 250 mL/hr over 60 Minutes Intravenous Every 24 hours 11/24/20 2146 11/30/20 0726       Objective: Vitals:   11/30/20 0457 11/30/20 0756 11/30/20 1127 11/30/20 1659  BP:  112/69 126/75 104/65  Pulse:  79 85 84  Resp:  19 12 13   Temp:  98.5 F (36.9 C) 98.5 F (36.9 C) 98.9 F (37.2 C)  TempSrc:  Oral Oral Oral  SpO2:  95% 92% 92%  Weight: 46.4 kg     Height:        Intake/Output Summary (Last 24 hours) at 11/30/2020 1726 Last data filed at 11/30/2020 0445 Gross per 24 hour  Intake --  Output 550 ml  Net -550 ml   Filed Weights   11/28/20 0339 11/29/20 0400 11/30/20 0457  Weight: 47.6 kg 47.3 kg 46.4 kg    Examination:  General: Frail-appearing no acute distress.  She is alert and interactive.   Respiratory: Clear to auscultation with no wheezes or rales.   Cardiac: Regular rate and rhythm no rubs or gallops.   GI: Nontender nondistended normal bowel sounds present.   Musculoskeletal: Lower extremity edema bilaterally.   Skin: No rashes noted. Neuro: No acute focal motor or sensory deficits noted.   Data Reviewed: I have personally reviewed following labs and imaging studies  CBC: Recent Labs  Lab 11/24/20 2119 11/25/20 0128 11/25/20 0153 11/25/20 0909 11/26/20 0530 11/27/20 0317  WBC 11.1*  --   --   --  14.0* 13.2*  NEUTROABS 9.4*  --   --   --   --   --   HGB 10.5* 10.5* 10.5* 10.5* 8.8* 8.4*  HCT 34.6* 31.0* 31.0* 31.0* 27.3* 26.8*  MCV 103.0*  --   --   --  98.6 100.8*  PLT 343  --   --   --  242 245    Basic Metabolic Panel: Recent Labs  Lab 11/25/20 1520 11/26/20 0530 11/27/20 0317 11/29/20 0022 11/30/20 0350  NA 136 140 138 136 135  K 4.1 4.4 5.6* 5.0 4.4  CL 103 104 104 99 100  CO2 21* 27 23 28 27   GLUCOSE 195* 125* 301* 345* 335*  BUN  10 11 12 15 15   CREATININE 1.06* 1.13* 1.37* 1.36* 1.33*  CALCIUM 9.2 9.2 9.1 9.3 9.3    GFR: Estimated Creatinine Clearance: 33.8 mL/min (A) (by C-G formula based on SCr of 1.33 mg/dL (H)).  Liver Function Tests: Recent Labs  Lab 11/24/20 2119  AST 32  ALT 27  ALKPHOS 64  BILITOT 0.6  PROT 7.0  ALBUMIN 3.6    CBG: Recent Labs  Lab 11/30/20 0454 11/30/20 0656 11/30/20 0754 11/30/20 1202 11/30/20 1659  GLUCAP 232* 199* 192* 80 317*     Recent Results (from the past 240 hour(s))  Urine Culture     Status: Abnormal  Collection Time: 11/24/20  9:19 PM   Specimen: In/Out Cath Urine  Result Value Ref Range Status   Specimen Description IN/OUT CATH URINE  Final   Special Requests   Final    NONE Performed at [facility], [address], [place], [place] [address]    Culture (A)  Final    10,000 COLONIES/mL MULTIPLE SPECIES PRESENT, SUGGEST RECOLLECTION   Report Status 11/26/2020 FINAL  Final  Blood culture (routine single)     Status: None   Collection Time: 11/24/20  9:46 PM   Specimen: BLOOD  Result Value Ref Range Status   Specimen Description BLOOD SITE NOT SPECIFIED  Final   Special Requests   Final    BOTTLES DRAWN AEROBIC AND ANAEROBIC Blood Culture adequate volume   Culture   Final    NO GROWTH 5 DAYS Performed at [facility], [address] [place], [place] [address]    Report Status 11/29/2020 FINAL  Final  Culture, blood (single)     Status: None   Collection Time: 11/24/20  9:55 PM   Specimen: BLOOD  Result Value Ref Range Status   Specimen Description BLOOD SITE NOT SPECIFIED  Final   Special Requests   Final    BOTTLES DRAWN AEROBIC AND ANAEROBIC Blood Culture adequate volume   Culture   Final    NO GROWTH 5 DAYS Performed at [facility], [address], [place], [place] [address]    Report Status 11/29/2020 FINAL  Final         Radiology Studies: No results found.      Scheduled Meds:  aspirin EC  81 mg  Oral Daily   clopidogrel  75 mg Oral Daily   dronabinol  5 mg Oral BID AC   enoxaparin (LOVENOX) injection  40 mg Subcutaneous Q24H   feeding supplement  237 mL Oral TID BM   insulin aspart  0-5 Units Subcutaneous QHS   insulin aspart  0-6 Units Subcutaneous TID WC   insulin aspart protamine- aspart  4 Units Subcutaneous BID WC   insulin glargine  5 Units Subcutaneous BID   ivabradine  2.5 mg Oral BID WC   mirtazapine  7.5 mg Oral QHS   multivitamin with minerals  1 tablet Oral Daily   thiamine  100 mg Oral Daily   Continuous Infusions:  sodium chloride       LOS: 5 days     Darlin Drop, MD [facility]   To contact the attending provider between 7A-7P or the covering provider during after hours 7P-7A, please log into the web site [email or web address] and access using universal [place] password for that web site. If you do not have the password, please call the hospital operator.  11/30/2020, 5:26 PM

## 2020-11-30 NOTE — Progress Notes (Signed)
00:00 CBG check was 420.  Dr. Loney Loh notified.  Orders given for STAT BMET draw and 5 units novolog.  Novolog given and CBG recheck was 376.  Will continue to monitor the patient.

## 2020-11-30 NOTE — Plan of Care (Signed)
  Problem: Education: Goal: Ability to describe self-care measures that may prevent or decrease complications (Diabetes Survival Skills Education) will improve Outcome: Progressing Goal: Individualized Educational Video(s) Outcome: Progressing   Problem: Nutritional: Goal: Maintenance of adequate nutrition will improve Outcome: Progressing Goal: Progress toward achieving an optimal weight will improve Outcome: Progressing   Problem: Education: Goal: Knowledge of General Education information will improve Description: Including pain rating scale, medication(s)/side effects and non-pharmacologic comfort measures Outcome: Progressing   Problem: Health Behavior/Discharge Planning: Goal: Ability to manage health-related needs will improve Outcome: Progressing   Problem: Clinical Measurements: Goal: Ability to maintain clinical measurements within normal limits will improve Outcome: Progressing Goal: Will remain free from infection Outcome: Progressing Goal: Diagnostic test results will improve Outcome: Progressing Goal: Respiratory complications will improve Outcome: Progressing Goal: Cardiovascular complication will be avoided Outcome: Progressing   Problem: Activity: Goal: Risk for activity intolerance will decrease Outcome: Progressing   Problem: Nutrition: Goal: Adequate nutrition will be maintained Outcome: Progressing   Problem: Coping: Goal: Level of anxiety will decrease Outcome: Progressing   Problem: Elimination: Goal: Will not experience complications related to bowel motility Outcome: Progressing Goal: Will not experience complications related to urinary retention Outcome: Progressing   Problem: Pain Managment: Goal: General experience of comfort will improve Outcome: Progressing   Problem: Safety: Goal: Ability to remain free from injury will improve Outcome: Progressing   Problem: Skin Integrity: Goal: Risk for impaired skin integrity will  decrease Outcome: Progressing   Problem: Fluid Volume: Goal: Hemodynamic stability will improve Outcome: Progressing   Problem: Clinical Measurements: Goal: Diagnostic test results will improve Outcome: Progressing Goal: Signs and symptoms of infection will decrease Outcome: Progressing   Problem: Respiratory: Goal: Ability to maintain adequate ventilation will improve Outcome: Progressing   

## 2020-12-01 ENCOUNTER — Other Ambulatory Visit (HOSPITAL_COMMUNITY): Payer: Self-pay

## 2020-12-01 DIAGNOSIS — I428 Other cardiomyopathies: Secondary | ICD-10-CM

## 2020-12-01 LAB — GLUCOSE, CAPILLARY
Glucose-Capillary: 268 mg/dL — ABNORMAL HIGH (ref 70–99)
Glucose-Capillary: 254 mg/dL — ABNORMAL HIGH (ref 70–99)
Glucose-Capillary: 150 mg/dL — ABNORMAL HIGH (ref 70–99)
Glucose-Capillary: 42 mg/dL — CL (ref 70–99)
Glucose-Capillary: 203 mg/dL — ABNORMAL HIGH (ref 70–99)
Glucose-Capillary: 229 mg/dL — ABNORMAL HIGH (ref 70–99)

## 2020-12-01 MED ORDER — DEXTROSE 50 % IV SOLN
INTRAVENOUS | Status: AC
  Administered 2020-12-01: 50 mL
  Filled 2020-12-01: qty 50

## 2020-12-01 MED ORDER — INSULIN GLARGINE 100 UNIT/ML SOLOSTAR PEN
5.0000 [IU] | PEN_INJECTOR | Freq: Two times a day (BID) | SUBCUTANEOUS | 3 refills | Status: DC
Start: 2020-12-01 — End: 2020-12-03
  Filled 2020-12-01: qty 3, 30d supply, fill #0

## 2020-12-01 MED ORDER — INSULIN ASPART 100 UNIT/ML IJ SOLN
4.0000 [IU] | Freq: Three times a day (TID) | INTRAMUSCULAR | Status: DC
  Administered 2020-12-01 – 2020-12-02 (×4): 4 [IU] via SUBCUTANEOUS

## 2020-12-01 MED ORDER — NOVOLOG FLEXPEN 100 UNIT/ML ~~LOC~~ SOPN
4.0000 [IU] | PEN_INJECTOR | Freq: Three times a day (TID) | SUBCUTANEOUS | 0 refills | Status: DC
  Filled 2020-12-01: qty 3, 25d supply, fill #0

## 2020-12-01 NOTE — Progress Notes (Signed)
Blood sugar was  42. This RN pushed PRN Dextrose 50 ml. Blood sugar now at 203. MD notified. Patient eating her dinner tray and watching T.V.

## 2020-12-01 NOTE — Discharge Summary (Addendum)
Physician Discharge Summary  Christine Benitez PYP:[id number] DOB: 04-06-1961 DOA: 11/24/2020  PCP: Claiborne Billings, MD  Admit date: 11/24/2020 Discharge date: 12/02/2020  Admitted From: home Disposition:  hh  Recommendations for Outpatient Follow-up:  Follow up with PCP in 1-2 weeks Please obtain BMP/CBC in one week   Home Health:Yes  Equipment/Devices:No  Discharge Condition: Stable Code Status:   Code Status: DNR Diet recommendation:  Diet Order             Diet Carb Modified Fluid consistency: Thin; Room service appropriate? Yes  Diet effective now                    Brief/Interim Summary: 60 year old female with history of type 2 diabetes on insulin, history of previous CVA, nonischemic cardiomyopathy pulmonary hypertension, essential hypertension, who presented to [facility] ED from home via EMS due to unresponsiveness and severe hypoglycemia with CBG 26.Upon presentation to the ED she was found to have a right-sided pneumonia and hypoglycemic, suspected that she was getting too much insulin at home.  She has been treated with 5 days of IV antibiotics.  Hospital course complicated by labile blood sugars.  Hyperglycemic then hypoglycemic.  Seen by diabetes coordinator.  Her insulin dose has been adjusted. Was Lantus 5 units twice daily, NovoLog 70/30 4 units twice daily, and insulin sliding scale.  On discharge plan for Lantus 5 units twice daily insulin NovoLog 4 units before meals and monitoring sugar 4 times a day and follow-up with PCP.  This was further discussed with patient's daughter in detail who verbalized understanding. At this time she is medically stable for discharge home.  Discharge Diagnoses:  Sepsis/pneumonia: Completed antibiotics 5 days at this time clinically stable afebrile  Recurrent hypoglycemia in the setting of inadequate oral intake: Also worsened by sepsis At this time blood sugar fairly stable keep on Lantus 5 units twice daily and  4 units Premeal  NovoLog Detail instructions given to daughter over the phone. Pt monitored 24 hrs since low sugar following sliding scale insulin administration on 7/25 pm, which has since been discontinued. Recent Labs  Lab 12/01/20 1652 12/01/20 1731 12/01/20 2105 12/02/20 0753 12/02/20 1154  GLUCAP 42* 203* 229* 281* 317*    Hyperkalemia due to AKI resolved resume home [place]elma AKI on CKD stage II creatinine at this time stable continue outpatient follow-up Recent Labs  Lab 11/25/20 1520 11/26/20 0530 11/27/20 0317 11/29/20 0022 11/30/20 0350  BUN $Re'10 11 12 15 15  'qNQ$ CREATININE 1.06* 1.13* 1.37* 1.36* 1.33*    Nonischemic cardiomyopathy: Stable Lactic acidosis/NAGMA resolved History of CVA: Continue on DAPT, Lipitor Acute metabolic encephalopathy resolved delirium precaution  Consults: DM COORDINATOR  Subjective: Alert awake communicative interactive blood sugar is stable tolerating well.  Discharge Exam: Vitals:   12/02/20 0908 12/02/20 1152  BP: 126/65 104/62  Pulse: 83 82  Resp: 17 17  Temp: 98.2 F (36.8 C) 98.1 F (36.7 C)  SpO2: 98% 99%   General: Pt is alert, awake, not in acute distress Cardiovascular: RRR, S1/S2 +, no rubs, no gallops Respiratory: CTA bilaterally, no wheezing, no rhonchi Abdominal: Soft, NT, ND, bowel sounds + Extremities: no edema, no cyanosis  Discharge Instructions   Allergies as of 12/02/2020   No Known Allergies      Medication List     TAKE these medications    albuterol 108 (90 Base) MCG/ACT inhaler Commonly known as: VENTOLIN HFA Inhale 2 puffs into the lungs every 6 (six) hours as needed for  wheezing or shortness of breath.   aspirin 81 MG EC tablet TAKE 1 TABLET (81 MG TOTAL) BY MOUTH DAILY.   atorvastatin 40 MG tablet Commonly known as: LIPITOR Take 1 tablet (40 mg total) by mouth every evening.   clopidogrel 75 MG tablet Commonly known as: Plavix Take 1 tablet (75 mg total) by mouth daily.   dronabinol 5 MG  capsule Commonly known as: MARINOL Take 1 capsule (5 mg total) by mouth 2 (two) times daily before lunch and supper.   feeding supplement Liqd Take 237 mLs by mouth 2 (two) times daily between meals.   feeding supplement Liqd Take 237 mLs by mouth 3 (three) times daily between meals.   ivabradine 5 MG Tabs tablet Commonly known as: Corlanor Take 0.5 tablets (2.5 mg total) by mouth 2 (two) times daily with a meal.   Lantus SoloStar 100 UNIT/ML Solostar Pen Generic drug: insulin glargine Inject 5 Units into the skin 2 (two) times daily. What changed: how much to take   mirtazapine 15 MG disintegrating tablet Commonly known as: REMERON SOL-TAB Take 0.5 tablets (7.5 mg total) by mouth at bedtime.   multivitamin with minerals Tabs tablet Take 1 tablet by mouth daily.   NovoLOG FlexPen 100 UNIT/ML FlexPen Generic drug: insulin aspart Inject 4 Units into the skin 3 (three) times daily with meals. What changed: how much to take   Pentips 32G X 4 MM Misc Generic drug: Insulin Pen Needle Use as directed   sodium zirconium cyclosilicate 10 g Pack packet Commonly known as: LOKELMA Take 10 g by mouth daily. What changed: when to take this               Durable Medical Equipment  (From admission, onward)           Start     Ordered   11/30/20 1741  For home use only DME 3 n 1  Once        11/30/20 1740   11/30/20 1741  For home use only DME standard manual wheelchair with seat cushion  Once       Comments: Patient suffers from ambulatory dysfunction which impairs their ability to perform daily activities like walking in the home.  A walker will not resolve issue with performing activities of daily living. A wheelchair will allow patient to safely perform daily activities. Patient can safely propel the wheelchair in the home or has a caregiver who can provide assistance. Length of need 6 months.. Accessories: elevating leg rests (ELRs), wheel locks, extensions and  anti-tippers.   11/30/20 1740   11/30/20 1740  For home use only DME Walker rolling  Once       Question Answer Comment  Walker: With 5 Inch Wheels   Patient needs a walker to treat with the following condition Ambulatory dysfunction      11/30/20 1740   11/28/20 1340  For home use only DME 3 n 1  Once        11/28/20 1340   11/28/20 1339  For home use only DME lightweight manual wheelchair with seat cushion  Once       Comments: Patient suffers from weakness which impairs their ability to perform daily activities like grooming in the home.  A walker will not resolve  issue with performing activities of daily living. A wheelchair will allow patient to safely perform daily activities. Patient is not able to propel themselves in the home using a standard weight wheelchair due to general  weakness. Patient can self propel in the lightweight wheelchair. Length of need 6 months . Accessories: elevating leg rests (ELRs), wheel locks, extensions and anti-tippers.   11/28/20 1338            Follow-up Information     Claiborne Billings, MD Follow up in 1 week(s).   Specialty: Family Medicine Contact information: [place] [address] [phone]         Berniece Salines, DO .   Specialty: Cardiology Contact information: [place] [address] [phone]                No Known Allergies  The results of significant diagnostics from this hospitalization (including imaging, microbiology, ancillary and laboratory) are listed below for reference.    Microbiology: Recent Results (from the past 240 hour(s))  Urine Culture     Status: Abnormal   Collection Time: 11/24/20  9:19 PM   Specimen: In/Out Cath Urine  Result Value Ref Range Status   Specimen Description IN/OUT CATH URINE  Final   Special Requests   Final    NONE Performed at [facility], [address] [place], [place] [address]    Culture (A)  Final    10,000 COLONIES/mL  MULTIPLE SPECIES PRESENT, SUGGEST RECOLLECTION   Report Status 11/26/2020 FINAL  Final  Blood culture (routine single)     Status: None   Collection Time: 11/24/20  9:46 PM   Specimen: BLOOD  Result Value Ref Range Status   Specimen Description BLOOD SITE NOT SPECIFIED  Final   Special Requests   Final    BOTTLES DRAWN AEROBIC AND ANAEROBIC Blood Culture adequate volume   Culture   Final    NO GROWTH 5 DAYS Performed at [facility], [place] [address] [place], [place] [address]    Report Status 11/29/2020 FINAL  Final  Culture, blood (single)     Status: None   Collection Time: 11/24/20  9:55 PM   Specimen: BLOOD  Result Value Ref Range Status   Specimen Description BLOOD SITE NOT SPECIFIED  Final   Special Requests   Final    BOTTLES DRAWN AEROBIC AND ANAEROBIC Blood Culture adequate volume   Culture   Final    NO GROWTH 5 DAYS Performed at [facility], [place] [address] [place], [place] [address]    Report Status 11/29/2020 FINAL  Final    Procedures/Studies: CT HEAD WO CONTRAST  Result Date: 11/25/2020 CLINICAL DATA:  Delirium EXAM: CT HEAD WITHOUT CONTRAST TECHNIQUE: Contiguous axial images were obtained from the base of the skull through the vertex without intravenous contrast. COMPARISON:  10/24/2020 FINDINGS: Brain: There is no mass, hemorrhage or extra-axial collection. There is generalized atrophy without lobar predilection. Hypodensity of the white matter is most commonly associated with chronic microvascular disease. Vascular: No abnormal hyperdensity of the major intracranial arteries or dural venous sinuses. No intracranial atherosclerosis. Skull: The visualized skull base, calvarium and extracranial soft tissues are normal. Sinuses/Orbits: No fluid levels or advanced mucosal thickening of the visualized paranasal sinuses. No mastoid or middle ear effusion. The orbits are normal. IMPRESSION: Generalized atrophy and chronic microvascular ischemia without acute  intracranial abnormality. Electronically Signed   By: Ulyses Jarred M.D.   On: 11/25/2020 03:53   DG Chest Port 1 View  Result Date: 11/24/2020 CLINICAL DATA:  Sepsis EXAM: PORTABLE CHEST 1 VIEW COMPARISON:  10/24/2020 FINDINGS: There is increased opacity within the right lung. No  pleural effusion or pneumothorax. Normal cardiomediastinal contours. IMPRESSION: Right lung opacities, likely pneumonia. Electronically Signed   By: Ulyses Jarred M.D.   On: 11/24/2020 21:40   DG Swallowing Func-Speech Pathology  Result Date: 11/26/2020 Formatting of this result is different from the original. Objective Swallowing Evaluation: Type of Study: MBS-Modified Barium Swallow Study  Patient Details Name: ANSHIKA PETHTEL MRN: [id number] Date of Birth: 02/02/1961 Today's Date: 11/26/2020 Time: SLP Start Time (ACUTE ONLY): 1215 -SLP Stop Time (ACUTE ONLY): 1227 SLP Time Calculation (min) (ACUTE ONLY): 12 min Past Medical History: Past Medical History: Diagnosis Date  Abnormal LFTs   a. 05/2014 -  ALT 52, alk phos 130.  Chronic systolic CHF (congestive heart failure) ([place])   a. Dx 05/2014 - echo at [facility] - EF 30-35%, moderate LVH, no rWMA, mild to moderate MR, moderate PH with PA pressure 60-11mmHg, mild to moderate pericardial effusion. EF 30-35% by cath.  History of blood transfusion 1986  "related to C-section"  Hypertension   Insulin dependent diabetes mellitus   Iron deficiency anemia   NICM (nonischemic cardiomyopathy) ([place])   a. [place] 06/11/14:  minor nonobstructive CAD with mild irregularities in the LAD less than 10-20%, LCx with mild disease less than 20%, no significant disease in the RCA, EF 30-35%.  Pericardial effusion   a. Mild-moderate pericardial effusion by echo at [facility].  Pulmonary hypertension ([place])   a. Moderate PH by echo at [facility].  Tobacco abuse  Past Surgical History: Past Surgical History: Procedure Laterality Date  APPENDECTOMY  ~ 2003  [place] WITH CORONARY  ANGIOGRAM N/A 06/11/2014  Procedure: LEFT HEART CATHETERIZATION WITH CORONARY ANGIOGRAM;  Surgeon: Peter M Martinique, MD;  Location: [facility] CATH LAB;  Service: Cardiovascular;  Laterality: N/A; HPI: Pt is a 60 y.o. female who presented to the ED via EMS due to unresponsiveness and severe hypoglycemia. CT chest: Multifocal right lung pneumonia. PMH: diabetes, CVA, nonischemic cardiomyopathy, pulmonary hypertension, hypertension. Pt was alst seen by SLP in [place], 2021 at which time she was on a dysphgia 2 diet and thin liquids.  No data recorded Assessment / Plan / Recommendation CHL IP CLINICAL IMPRESSIONS 11/26/2020 Clinical Impression Pt was seen in radiology suite for modified barium swallow study. Trials of puree solids, regular texture solids, a 60mm barium tablet, and thin liquids via cup and straw were administered. Pt's oropharyngeal swallow mechanism was within functional limits. It is recommended that a regular texture diet with thin liquids be continued at this time to maximize pt's options. Further skilled SLP services are not clinically indicated at this time. SLP Visit Diagnosis Dysphagia, unspecified (R13.10) Attention and concentration deficit following -- Frontal lobe and executive function deficit following -- Impact on safety and function Mild aspiration risk   CHL IP TREATMENT RECOMMENDATION 11/26/2020 Treatment Recommendations Therapy as outlined in treatment plan below   Prognosis 11/26/2020 Prognosis for Safe Diet Advancement Good Barriers to Reach Goals -- Barriers/Prognosis Comment -- CHL IP DIET RECOMMENDATION 11/26/2020 SLP Diet Recommendations Regular solids;Thin liquid Liquid Administration via Cup;Straw Medication Administration Whole meds with liquid Compensations Slow rate;Small sips/bites Postural Changes --   CHL IP OTHER RECOMMENDATIONS 11/26/2020 Recommended Consults -- Oral Care Recommendations Oral care BID Other Recommendations --   CHL IP FOLLOW UP RECOMMENDATIONS 11/26/2020 Follow up  Recommendations None   CHL IP FREQUENCY AND DURATION 11/26/2020 Speech Therapy Frequency (ACUTE ONLY) min 1 x/week Treatment Duration 1 week      CHL IP ORAL PHASE 11/26/2020 Oral Phase  WFL Oral - Pudding Teaspoon -- Oral - Pudding Cup -- Oral - Honey Teaspoon -- Oral - Honey Cup -- Oral - Nectar Teaspoon -- Oral - Nectar Cup -- Oral - Nectar Straw -- Oral - Thin Teaspoon -- Oral - Thin Cup -- Oral - Thin Straw -- Oral - Puree -- Oral - Mech Soft -- Oral - Regular -- Oral - Multi-Consistency -- Oral - Pill -- Oral Phase - Comment --  CHL IP PHARYNGEAL PHASE 11/26/2020 Pharyngeal Phase WFL Pharyngeal- Pudding Teaspoon -- Pharyngeal -- Pharyngeal- Pudding Cup -- Pharyngeal -- Pharyngeal- Honey Teaspoon -- Pharyngeal -- Pharyngeal- Honey Cup -- Pharyngeal -- Pharyngeal- Nectar Teaspoon -- Pharyngeal -- Pharyngeal- Nectar Cup -- Pharyngeal -- Pharyngeal- Nectar Straw -- Pharyngeal -- Pharyngeal- Thin Teaspoon -- Pharyngeal -- Pharyngeal- Thin Cup Penetration/Aspiration during swallow Pharyngeal Material enters airway, remains ABOVE vocal cords then ejected out Pharyngeal- Thin Straw Penetration/Aspiration during swallow Pharyngeal Material enters airway, remains ABOVE vocal cords then ejected out Pharyngeal- Puree -- Pharyngeal -- Pharyngeal- Mechanical Soft -- Pharyngeal -- Pharyngeal- Regular -- Pharyngeal -- Pharyngeal- Multi-consistency -- Pharyngeal -- Pharyngeal- Pill Penetration/Aspiration during swallow Pharyngeal Material enters airway, remains ABOVE vocal cords then ejected out Pharyngeal Comment --  Shanika I. Hardin Negus, [place], [place] Office number [phone] Pager [phone] [place] Marshall 11/26/2020, 1:24 PM              CT CHEST ABDOMEN PELVIS WO CONTRAST  Result Date: 11/25/2020 CLINICAL DATA:  Respiratory failure EXAM: CT CHEST, ABDOMEN AND PELVIS WITHOUT CONTRAST TECHNIQUE: Multidetector CT imaging of the chest, abdomen and pelvis was performed following the standard  protocol without IV contrast. COMPARISON:  Chest radiograph dated 11/24/2020. CTA chest dated 06/09/2014. FINDINGS: CT CHEST FINDINGS Cardiovascular: Heart is normal in size.  No pericardial effusion. No evidence of thoracic aortic aneurysm. Mild atherosclerotic calcifications of the descending thoracic aorta. Three vessel coronary atherosclerosis. Mediastinum/Nodes: No suspicious mediastinal lymphadenopathy. Visualized thyroid is unremarkable. Lungs/Pleura: Multifocal patchy opacities predominantly in the posterior right upper lobe and posterior right lower lobe, with additional tree-in-bud nodularity throughout the right lung, compatible with multifocal pneumonia. Mild patchy opacity in the posterior left lower lobe, possibly atelectasis. Mild centrilobular and paraseptal emphysematous changes, upper lung predominant. No pleural effusion or pneumothorax. Musculoskeletal: Visualized osseous structures are within normal limits. CT ABDOMEN PELVIS FINDINGS Hepatobiliary: Unenhanced liver is unremarkable. Gallbladder is unremarkable. No intrahepatic or extrahepatic ductal dilatation. Pancreas: Within normal limits. Spleen: Within normal limits. Adrenals/Urinary Tract: Adrenal glands are within normal limits. Left renal atrophy. Right kidney is within normal limits. No renal calculi or hydronephrosis. Bladder is within normal limits. Stomach/Bowel: Stomach is within normal limits. No evidence of bowel obstruction. Appendix is not discretely visualized. Vascular/Lymphatic: No evidence of abdominal aortic aneurysm. Atherosclerotic calcifications of the abdominal aorta and branch vessels. No suspicious abdominopelvic lymphadenopathy. Reproductive: Uterus is within normal limits. No adnexal masses. Other: No abdominopelvic ascites. Musculoskeletal: Visualized osseous structures are within normal limits. IMPRESSION: Multifocal right lung pneumonia. Mild left renal atrophy. No acute findings in the abdomen/pelvis.  Electronically Signed   By: Julian Hy M.D.   On: 11/25/2020 03:54    Labs: BNP (last 3 results) No results for input(s): BNP in the last 8760 hours. Basic Metabolic Panel: Recent Labs  Lab 11/25/20 1520 11/26/20 0530 11/27/20 0317 11/29/20 0022 11/30/20 0350  NA 136 140 138 136 135  K 4.1 4.4 5.6* 5.0 4.4  CL 103 104 104 99 100  CO2 21* $Remov'27 23 28 'BdZCYc$ 27  GLUCOSE 195* 125* 301* 345* 335*  BUN $Re'10 11 12 15 15  'huF$ CREATININE 1.06* 1.13* 1.37* 1.36* 1.33*  CALCIUM 9.2 9.2 9.1 9.3 9.3   Liver Function Tests: No results for input(s): AST, ALT, ALKPHOS, BILITOT, PROT, ALBUMIN in the last 168 hours.  No results for input(s): LIPASE, AMYLASE in the last 168 hours. No results for input(s): AMMONIA in the last 168 hours.  CBC: Recent Labs  Lab 11/26/20 0530 11/27/20 0317  WBC 14.0* 13.2*  HGB 8.8* 8.4*  HCT 27.3* 26.8*  MCV 98.6 100.8*  PLT 242 245   Cardiac Enzymes: No results for input(s): CKTOTAL, CKMB, CKMBINDEX, TROPONINI in the last 168 hours. BNP: Invalid input(s): POCBNP CBG: Recent Labs  Lab 12/01/20 1652 12/01/20 1731 12/01/20 2105 12/02/20 0753 12/02/20 1154  GLUCAP 42* 203* 229* 281* 317*   D-Dimer No results for input(s): DDIMER in the last 72 hours. Hgb A1c No results for input(s): HGBA1C in the last 72 hours. Lipid Profile No results for input(s): CHOL, HDL, LDLCALC, TRIG, CHOLHDL, LDLDIRECT in the last 72 hours. Thyroid function studies No results for input(s): TSH, T4TOTAL, T3FREE, THYROIDAB in the last 72 hours.  Invalid input(s): FREET3 Anemia work up No results for input(s): VITAMINB12, FOLATE, FERRITIN, TIBC, IRON, RETICCTPCT in the last 72 hours. Urinalysis    Component Value Date/Time   COLORURINE YELLOW 11/25/2020 0033   APPEARANCEUR CLOUDY (A) 11/25/2020 0033   LABSPEC 1.011 11/25/2020 0033   PHURINE 8.0 11/25/2020 0033   GLUCOSEU 150 (A) 11/25/2020 0033   HGBUR NEGATIVE 11/25/2020 0033   BILIRUBINUR NEGATIVE 11/25/2020 0033    KETONESUR NEGATIVE 11/25/2020 0033   PROTEINUR NEGATIVE 11/25/2020 0033   NITRITE POSITIVE (A) 11/25/2020 0033   LEUKOCYTESUR NEGATIVE 11/25/2020 0033   Sepsis Labs Invalid input(s): PROCALCITONIN,  WBC,  LACTICIDVEN Microbiology Recent Results (from the past 240 hour(s))  Urine Culture     Status: Abnormal   Collection Time: 11/24/20  9:19 PM   Specimen: In/Out Cath Urine  Result Value Ref Range Status   Specimen Description IN/OUT CATH URINE  Final   Special Requests   Final    NONE Performed at [facility], [address] [place], [place] [address]    Culture (A)  Final    10,000 COLONIES/mL MULTIPLE SPECIES PRESENT, SUGGEST RECOLLECTION   Report Status 11/26/2020 FINAL  Final  Blood culture (routine single)     Status: None   Collection Time: 11/24/20  9:46 PM   Specimen: BLOOD  Result Value Ref Range Status   Specimen Description BLOOD SITE NOT SPECIFIED  Final   Special Requests   Final    BOTTLES DRAWN AEROBIC AND ANAEROBIC Blood Culture adequate volume   Culture   Final    NO GROWTH 5 DAYS Performed at [facility], [place] [address], [place], [place] [address]    Report Status 11/29/2020 FINAL  Final  Culture, blood (single)     Status: None   Collection Time: 11/24/20  9:55 PM   Specimen: BLOOD  Result Value Ref Range Status   Specimen Description BLOOD SITE NOT SPECIFIED  Final   Special Requests   Final    BOTTLES DRAWN AEROBIC AND ANAEROBIC Blood Culture adequate volume   Culture   Final    NO GROWTH 5 DAYS Performed at [facility], [place] [address] [place], [place] [address]    Report Status 11/29/2020 FINAL  Final     Time coordinating discharge: 25 minutes  SIGNED: Antonieta Pert, MD  [facility] 12/02/2020, 2:37 PM  If 7PM-7AM, please contact night-coverage [email or web address]

## 2020-12-01 NOTE — Plan of Care (Signed)
  Problem: Education: Goal: Ability to describe self-care measures that may prevent or decrease complications (Diabetes Survival Skills Education) will improve Outcome: Progressing Goal: Individualized Educational Video(s) Outcome: Progressing   Problem: Nutritional: Goal: Maintenance of adequate nutrition will improve Outcome: Progressing Goal: Progress toward achieving an optimal weight will improve Outcome: Progressing   Problem: Education: Goal: Knowledge of General Education information will improve Description: Including pain rating scale, medication(s)/side effects and non-pharmacologic comfort measures Outcome: Progressing   Problem: Health Behavior/Discharge Planning: Goal: Ability to manage health-related needs will improve Outcome: Progressing   Problem: Clinical Measurements: Goal: Ability to maintain clinical measurements within normal limits will improve Outcome: Progressing Goal: Will remain free from infection Outcome: Progressing Goal: Diagnostic test results will improve Outcome: Progressing Goal: Respiratory complications will improve Outcome: Progressing Goal: Cardiovascular complication will be avoided Outcome: Progressing   Problem: Activity: Goal: Risk for activity intolerance will decrease Outcome: Progressing   Problem: Nutrition: Goal: Adequate nutrition will be maintained Outcome: Progressing   Problem: Coping: Goal: Level of anxiety will decrease Outcome: Progressing   Problem: Elimination: Goal: Will not experience complications related to bowel motility Outcome: Progressing Goal: Will not experience complications related to urinary retention Outcome: Progressing   Problem: Pain Managment: Goal: General experience of comfort will improve Outcome: Progressing   Problem: Safety: Goal: Ability to remain free from injury will improve Outcome: Progressing   Problem: Skin Integrity: Goal: Risk for impaired skin integrity will  decrease Outcome: Progressing   Problem: Fluid Volume: Goal: Hemodynamic stability will improve Outcome: Progressing   Problem: Clinical Measurements: Goal: Diagnostic test results will improve Outcome: Progressing Goal: Signs and symptoms of infection will decrease Outcome: Progressing   Problem: Respiratory: Goal: Ability to maintain adequate ventilation will improve Outcome: Progressing

## 2020-12-02 ENCOUNTER — Other Ambulatory Visit: Payer: Self-pay

## 2020-12-02 DIAGNOSIS — E11649 Type 2 diabetes mellitus with hypoglycemia without coma: Secondary | ICD-10-CM

## 2020-12-02 DIAGNOSIS — Z794 Long term (current) use of insulin: Secondary | ICD-10-CM

## 2020-12-02 LAB — GLUCOSE, CAPILLARY
Glucose-Capillary: 281 mg/dL — ABNORMAL HIGH (ref 70–99)
Glucose-Capillary: 317 mg/dL — ABNORMAL HIGH (ref 70–99)
Glucose-Capillary: 347 mg/dL — ABNORMAL HIGH (ref 70–99)
Glucose-Capillary: 227 mg/dL — ABNORMAL HIGH (ref 70–99)

## 2020-12-02 MED ORDER — SODIUM ZIRCONIUM CYCLOSILICATE 10 G PO PACK: 10.0000 g | PACK | Freq: Every day | ORAL | 0 refills | Status: AC

## 2020-12-02 NOTE — Plan of Care (Signed)
  Problem: Education: Goal: Ability to describe self-care measures that may prevent or decrease complications (Diabetes Survival Skills Education) will improve Outcome: Progressing Goal: Individualized Educational Video(s) Outcome: Progressing   Problem: Nutritional: Goal: Maintenance of adequate nutrition will improve Outcome: Progressing Goal: Progress toward achieving an optimal weight will improve Outcome: Progressing   Problem: Education: Goal: Knowledge of General Education information will improve Description: Including pain rating scale, medication(s)/side effects and non-pharmacologic comfort measures Outcome: Progressing   Problem: Health Behavior/Discharge Planning: Goal: Ability to manage health-related needs will improve Outcome: Progressing   Problem: Clinical Measurements: Goal: Ability to maintain clinical measurements within normal limits will improve Outcome: Progressing Goal: Will remain free from infection Outcome: Progressing Goal: Diagnostic test results will improve Outcome: Progressing Goal: Respiratory complications will improve Outcome: Progressing Goal: Cardiovascular complication will be avoided Outcome: Progressing   Problem: Activity: Goal: Risk for activity intolerance will decrease Outcome: Progressing   Problem: Nutrition: Goal: Adequate nutrition will be maintained Outcome: Progressing   Problem: Coping: Goal: Level of anxiety will decrease Outcome: Progressing   Problem: Elimination: Goal: Will not experience complications related to bowel motility Outcome: Progressing Goal: Will not experience complications related to urinary retention Outcome: Progressing   Problem: Pain Managment: Goal: General experience of comfort will improve Outcome: Progressing   Problem: Safety: Goal: Ability to remain free from injury will improve Outcome: Progressing   Problem: Skin Integrity: Goal: Risk for impaired skin integrity will  decrease Outcome: Progressing   Problem: Fluid Volume: Goal: Hemodynamic stability will improve Outcome: Progressing   Problem: Clinical Measurements: Goal: Diagnostic test results will improve Outcome: Progressing Goal: Signs and symptoms of infection will decrease Outcome: Progressing   Problem: Respiratory: Goal: Ability to maintain adequate ventilation will improve Outcome: Progressing   

## 2020-12-02 NOTE — Progress Notes (Signed)
Physical Therapy Treatment Patient Details Name: DANYLE BOENING MRN: [id number] DOB: 08-25-1960 Today's Date: 12/02/2020    History of Present Illness 60yo female admitted 11/24/20 due to unresponsiveness at home and CBG 26. Did receive CPR from family prior to EMS arrival. O2 65% on room air in the ED, required NRB to recover. Found to be in severe sepsis and acute hypoxic respiratory failure secondary to pneumonia. PMH CHF, HTN, DM, NICM, tobacco abuse    PT Comments    Patient received in bed, pleasantly confused but cooperative. Able to mobilize on a Min guard-MinA basis with RW today. Did become intermittently dizzy but BP and other vitals WNL. Tolerated gait training in room well today. Left up in recliner with all needs met, chair alarm active and nursing staff aware of patient status. Will continue to follow.     Follow Up Recommendations  Home health PT;Supervision/Assistance - 24 hour     Equipment Recommendations  Rolling walker with 5" wheels;3in1 (PT);Wheelchair (measurements PT);Wheelchair cushion (measurements PT)    Recommendations for Other Services       Precautions / Restrictions Precautions Precautions: Fall Precaution Comments: HOH, decreased vision Restrictions Weight Bearing Restrictions: No    Mobility  Bed Mobility Overal bed mobility: Needs Assistance Bed Mobility: Supine to Sit     Supine to sit: Min assist     General bed mobility comments: MinA to boost up to midline sitting at EOB and gain balance    Transfers Overall transfer level: Needs assistance Equipment used: Rolling walker (2 wheeled) Transfers: Sit to/from Stand Sit to Stand: Min assist         General transfer comment: MinA to boost to standing, good awareness of hand placement and sequencing; dizzy but vitals including BP WNL  Ambulation/Gait Ambulation/Gait assistance: Min guard Gait Distance (Feet): 25 Feet Assistive device: Rolling walker (2 wheeled) Gait  Pattern/deviations: Step-through pattern;Decreased stride length;Trunk flexed Gait velocity: reduced   General Gait Details: slow but steady with RW, cues for navigation in room and occasional MinA to avoid obstacles or get through narrow spaces   Stairs             Wheelchair Mobility    Modified Rankin (Stroke Patients Only)       Balance Overall balance assessment: Needs assistance Sitting-balance support: No upper extremity supported;Feet supported Sitting balance-Leahy Scale: Good     Standing balance support: Bilateral upper extremity supported;During functional activity Standing balance-Leahy Scale: Fair Standing balance comment: reliant on at least U UE support                            Cognition Arousal/Alertness: Awake/alert Behavior During Therapy: Flat affect Overall Cognitive Status: History of cognitive impairments - at baseline                     Current Attention Level: Sustained Memory: Decreased short-term memory Following Commands: Follows one step commands with increased time;Follows one step commands consistently Safety/Judgement: Decreased awareness of safety;Decreased awareness of deficits Awareness: Intellectual Problem Solving: Slow processing;Requires verbal cues;Decreased initiation;Difficulty sequencing General Comments: did well with following simple commands today, just very HOH so doesn't always hear all commands      Exercises      General Comments General comments (skin integrity, edema, etc.): VSS on RA      Pertinent Vitals/Pain Pain Assessment: Faces Faces Pain Scale: No hurt Pain Intervention(s): Limited activity within patient's tolerance;Monitored during session  Home Living                      Prior Function            PT Goals (current goals can now be found in the care plan section) Acute Rehab PT Goals Patient Stated Goal: to go home PT Goal Formulation: With patient Time  For Goal Achievement: 12/11/20 Potential to Achieve Goals: Good Progress towards PT goals: Progressing toward goals    Frequency    Min 3X/week      PT Plan Current plan remains appropriate    Co-evaluation              AM-PAC PT "6 Clicks" Mobility   Outcome Measure  Help needed turning from your back to your side while in a flat bed without using bedrails?: A Little Help needed moving from lying on your back to sitting on the side of a flat bed without using bedrails?: A Little Help needed moving to and from a bed to a chair (including a wheelchair)?: A Little Help needed standing up from a chair using your arms (e.g., wheelchair or bedside chair)?: A Little Help needed to walk in hospital room?: A Little Help needed climbing 3-5 steps with a railing? : A Little 6 Click Score: 18    End of Session Equipment Utilized During Treatment: Gait belt Activity Tolerance: Patient tolerated treatment well Patient left: in chair;with call bell/phone within reach;with chair alarm set Nurse Communication: Mobility status PT Visit Diagnosis: Other abnormalities of gait and mobility (R26.89);Muscle weakness (generalized) (M62.81);Difficulty in walking, not elsewhere classified (R26.2)     Time: 9470-7615 PT Time Calculation (min) (ACUTE ONLY): 28 min  Charges:  $Gait Training: 8-22 mins $Therapeutic Activity: 8-22 mins                    Windell Norfolk, DPT, PN1   Supplemental Physical Therapist [place]    Pager [phone] Acute Rehab Office [phone]

## 2020-12-02 NOTE — Progress Notes (Signed)
Patient medicated at bedside. Patient blood sugars still running high. Patient went back to sleep right after med pass. Resting comfortably.

## 2020-12-02 NOTE — Progress Notes (Signed)
Discharge was held yesterday due to lethargy came and assessed her patient was resting no new complaint.  In the evening blood sugar went down after administering sliding scale on top of her scheduled insulin. Monitor overnight blood sugar had stayed on higher side without hypoglycemia will allow her to run slightly on higher side given her labile blood sugar tendency to drop quickly.  So far doing well off sliding scale insulin continue Levemir Lantus Premeal.  She will be discharged home today if blood sugar does not drop at 4. She will need to follow-up with her doctor closely to adjust her insulin regimen for her diabetes.

## 2020-12-02 NOTE — Progress Notes (Signed)
Occupational Therapy Treatment Patient Details Name: Christine Benitez MRN: [id number] DOB: [birth year] Today's Date: 12/02/2020    History of present illness 60yo female admitted 11/24/20 due to unresponsiveness at home and CBG 26. Did receive CPR from family prior to EMS arrival. O2 65% on room air in the ED, required NRB to recover. Found to be in severe sepsis and acute hypoxic respiratory failure secondary to pneumonia. PMH CHF, HTN, DM, NICM, tobacco abuse   OT comments  Pt progressing to OOB ADL tasks and increased ability to care for self and mobilize in room. Pt limited by decreased strength, decreased ability to care for self and decreased activity tolerance. Pt standing at sink x3 mins and following all simple commands. Pt appears to be progressing since OT eval. Pt progressing well. OT following acutely.   Follow Up Recommendations  Home health OT;Supervision/Assistance - 24 hour    Equipment Recommendations  None recommended by OT    Recommendations for Other Services      Precautions / Restrictions Precautions Precautions: Fall Precaution Comments: HOH, decreased vision Restrictions Weight Bearing Restrictions: No       Mobility Bed Mobility Overal bed mobility: Needs Assistance Bed Mobility: Sit to Supine       Sit to supine: Supervision        Transfers Overall transfer level: Needs assistance Equipment used: Rolling walker (2 wheeled) Transfers: Sit to/from Stand Sit to Stand: Min guard Stand pivot transfers: Min guard       General transfer comment: [email or web address]    Balance Overall balance assessment: Needs assistance Sitting-balance support: No upper extremity supported;Feet supported Sitting balance-Leahy Scale: Good     Standing balance support: Single extremity supported;During functional activity Standing balance-Leahy Scale: Fair                             ADL either performed or assessed with clinical judgement   ADL Overall  ADL's : Needs assistance/impaired     Grooming: Supervision/safety;Standing;Wash/dry hands;Wash/dry face;Oral care;Brushing hair Grooming Details (indicate cue type and reason): standing x3 mins                 Toilet Transfer: Supervision/safety;RW;Cueing for safety;Ambulation   Toileting- Clothing Manipulation and Hygiene: Min guard;Sit to/from stand Toileting - Clothing Manipulation Details (indicate cue type and reason): sitting on BSC; performing own toilet hygiene for anterior and posterio pericare     Functional mobility during ADLs: Min guard;Rolling walker General ADL Comments: Pt limited by decreased strength, decreased ability to care for self and decreased activity tolerance. Pt standing at sink x3 mins and follwoing all simple commands. Pt appears to be progressing since OT eval.     Vision   Additional Comments: pt with baseline visual impairment per pt's brother   Perception     Praxis      Cognition Arousal/Alertness: Awake/alert Behavior During Therapy: Flat affect Overall Cognitive Status: History of cognitive impairments - at baseline                                 General Comments: Follows 1 step commands, very HOH.        Exercises     Shoulder Instructions       General Comments VSS on RA    Pertinent Vitals/ Pain       Pain Assessment: No/denies pain Pain Intervention(s): Monitored during session  Home  Living Family/patient expects to be discharged to:: Private residence Living Arrangements: Children                                      Prior Functioning/Environment              Frequency  Min 2X/week        Progress Toward Goals  OT Goals(current goals can now be found in the care plan section)  Progress towards OT goals: Progressing toward goals  Acute Rehab OT Goals Patient Stated Goal: to go home OT Goal Formulation: With patient Time For Goal Achievement: 12/16/20 Potential to  Achieve Goals: Good  Plan Discharge plan remains appropriate;Frequency remains appropriate    Co-evaluation                 AM-PAC OT "6 Clicks" Daily Activity     Outcome Measure   Help from another person eating meals?: None Help from another person taking care of personal grooming?: A Little Help from another person toileting, which includes using toliet, bedpan, or urinal?: A Little Help from another person bathing (including washing, rinsing, drying)?: A Little Help from another person to put on and taking off regular upper body clothing?: A Little Help from another person to put on and taking off regular lower body clothing?: A Little 6 Click Score: 19    End of Session Equipment Utilized During Treatment: Rolling walker  OT Visit Diagnosis: Muscle weakness (generalized) (M62.81)   Activity Tolerance Patient tolerated treatment well   Patient Left in bed;with call bell/phone within reach;with bed alarm set   Nurse Communication Mobility status        Time: 3244-0102 OT Time Calculation (min): 24 min  Charges: OT General Charges $OT Visit: 1 Visit OT Treatments $Self Care/Home Management : 8-22 mins $Therapeutic Activity: 8-22 mins Flora Lipps, OTR/L Acute Rehabilitation Services Pager: [phone] Office: [phone]    Shahrukh Pasch C 12/02/2020, 4:18 PM

## 2020-12-03 ENCOUNTER — Other Ambulatory Visit (HOSPITAL_COMMUNITY): Payer: Self-pay

## 2020-12-03 DIAGNOSIS — E1165 Type 2 diabetes mellitus with hyperglycemia: Secondary | ICD-10-CM

## 2020-12-03 LAB — GLUCOSE, CAPILLARY
Glucose-Capillary: 379 mg/dL — ABNORMAL HIGH (ref 70–99)
Glucose-Capillary: 308 mg/dL — ABNORMAL HIGH (ref 70–99)
Glucose-Capillary: 174 mg/dL — ABNORMAL HIGH (ref 70–99)
Glucose-Capillary: 103 mg/dL — ABNORMAL HIGH (ref 70–99)

## 2020-12-03 MED ORDER — INSULIN GLARGINE 100 UNIT/ML ~~LOC~~ SOLN
7.0000 [IU] | Freq: Two times a day (BID) | SUBCUTANEOUS | Status: DC
  Administered 2020-12-03: 7 [IU] via SUBCUTANEOUS
  Filled 2020-12-03 (×2): qty 0.07

## 2020-12-03 MED ORDER — NOVOLOG FLEXPEN 100 UNIT/ML ~~LOC~~ SOPN
5.0000 [IU] | PEN_INJECTOR | Freq: Three times a day (TID) | SUBCUTANEOUS | 0 refills | Status: DC
  Filled 2020-12-03: qty 6, 30d supply, fill #0

## 2020-12-03 MED ORDER — INSULIN GLARGINE 100 UNIT/ML SOLOSTAR PEN
7.0000 [IU] | PEN_INJECTOR | Freq: Two times a day (BID) | SUBCUTANEOUS | 0 refills | Status: DC
  Filled 2020-12-03: qty 6, 30d supply, fill #0

## 2020-12-03 MED ORDER — INSULIN ASPART 100 UNIT/ML IJ SOLN
5.0000 [IU] | Freq: Three times a day (TID) | INTRAMUSCULAR | Status: DC
  Administered 2020-12-03 (×2): 5 [IU] via SUBCUTANEOUS

## 2020-12-03 NOTE — TOC Transition Note (Addendum)
Transition of Care [facility]) - CM/SW Discharge Note   Patient Details  Name: Christine Benitez MRN: [id number] Date of Birth: 1960/08/23  [facility] of [facility] CM/SW Contact:  Mearl Latin, LCSW Phone Number: 12/03/2020, 2:31 PM   Clinical Narrative:    CSW received request for transportation home. CSW spoke with patient's daughter, [name]. She stated that family is unable to pick patient up and is agreeable to [facility] transport as they will be home to receive patient and help her inside. CSW contacted [facility]. They are searching for a door to door vendor and will contact CSW back. If not available, will schedule regular uber/lyft transport with family to assist out of the car upon arrival. Patient's correct address is [address] [place], [place] [address]. CSW notified [facility] and [facility] of discharge.   UPDATE: [facility] transport called back and stated [facility] can pick patient up a little after 4pm. RN's number provided to call when they arrive.      Barriers to Discharge: Barriers Resolved   Patient Goals and CMS Choice   CMS Medicare.gov Compare Post Acute Care list provided to:: Patient Represenative (must comment) Choice offered to / list presented to : Patient, Adult Children  Discharge Placement                Patient to be transferred to facility by: [facility] Name of family member notified: daughter Patient and family notified of of transfer: 12/03/20  Discharge Plan and Services   Discharge Planning Services: CM Consult Post Acute Care Choice: Durable Medical Equipment, Home Health          DME Arranged: 3-N-1 DME Agency: [employer] Date DME Agency Contacted: 11/28/20   Representative spoke with at DME Agency: Velna Hatchet HH Arranged: RN, PT, OT [facility] Agency: [facility] Date [facility] Agency Contacted: 11/28/20   Representative spoke with at [facility] Agency: Misty Stanley  Social Determinants of Health (SDOH) Interventions     Readmission Risk  Interventions Readmission Risk Prevention Plan 11/18/2020 11/06/2020  Transportation Screening Complete Complete  PCP or Specialist Appt within 5-7 Days - Not Complete  Not Complete comments - July 29  PCP or Specialist Appt within 3-5 Days Complete -  Home Care Screening - Complete  Medication Review (RN CM) - Complete  HRI or Home Care Consult Complete -  Social Work Consult for Recovery Care Planning/Counseling Complete -  Palliative Care Screening Complete -  Medication Review Oceanographer) Complete -  Some recent data might be hidden

## 2020-12-03 NOTE — Progress Notes (Signed)
Nutrition Follow-up  DOCUMENTATION CODES:   Underweight, Severe malnutrition in context of chronic illness  INTERVENTION:   -Continue Ensure Enlive po TID, each supplement provides 350 kcal and 20 grams of protein  -Continue MVI with minerals daily -Continue Magic cup TID with meals, each supplement provides 290 kcal and 9 grams of protein   NUTRITION DIAGNOSIS:   Moderate Malnutrition related to chronic illness (CHF, DM, CVA) as evidenced by mild fat depletion, moderate fat depletion, mild muscle depletion, moderate muscle depletion.  Ongoing  GOAL:   Patient will meet greater than or equal to 90% of their needs  Progressing   MONITOR:   PO intake, Supplement acceptance, Labs, Weight trends, Skin, I & O's  REASON FOR ASSESSMENT:   Consult Assessment of nutrition requirement/status  ASSESSMENT:   Christine Benitez is a 60 y.o. female with medical history significant for type 1? Type 2? diabetes, prior CVA, nonischemic cardiomyopathy, pulmonary hypertension, hypertension, who presented to [facility] ED from home via EMS due to unresponsiveness and severe hypoglycemia with CBG 26.  Reviewed I/O's: -530 ml x 24 hours and -424 ml since admission  UOP: 650 ml x 24 hours  Pt remains with poor oral intake. Noted meal completion 10-25%. Pt is consuming her Ensure supplements. Per palliative care notes, pt consuming bites and sips with meals.   Per MD notes, discharge was held secondary to lethargy. Plan to discharge today if no further hypoglycemia.   Medications reviewed and include marinol, remeron, and thiamine.   Labs reviewed: CBGS: [id number] (inpatient orders for glycemic control are 5 units insulin aspart TID with meals and 7 units insulin glargine BID).    Diet Order:   Diet Order             Diet Carb Modified Fluid consistency: Thin; Room service appropriate? Yes  Diet effective now                   EDUCATION NEEDS:   Education needs have been  addressed  Skin:  Skin Assessment: Reviewed RN Assessment  Last BM:  11/25/20  Height:   Ht Readings from Last 1 Encounters:  11/24/20 5\' 4"  (1.626 m)    Weight:   Wt Readings from Last 1 Encounters:  11/30/20 46.4 kg    Ideal Body Weight:  54.5 kg  BMI:  Body mass index is 17.56 kg/m.  Estimated Nutritional Needs:   Kcal:  1500-1700  Protein:  80-95 grams  Fluid:  > 1.5 L    12/02/20, RD, LDN, CDCES Registered Dietitian II Certified Diabetes Care and Education Specialist Please refer to [facility] for RD and/or RD on-call/weekend/after hours pager

## 2020-12-03 NOTE — Progress Notes (Signed)
PROGRESS NOTE    Christine Benitez  BWL:[id number] DOB: [birth year] DOA: 11/24/2020 PCP: Lupita Dawn, MD   Chief Complaint  Patient presents with   Hypoglycemia    Brief Narrative: 60 year old female with history of type 2 diabetes on insulin, history of previous CVA, nonischemic cardiomyopathy pulmonary hypertension, essential hypertension, who presented to [facility] ED from home via EMS due to unresponsiveness and severe hypoglycemia with CBG 26.Upon presentation to the ED she was found to have a right-sided pneumonia and hypoglycemic, suspected that she was getting too much insulin at home.  She has been treated with 5 days of IV antibiotics.  Hospital course complicated by labile blood sugars.  Hyperglycemic then hypoglycemic.  Seen by diabetes coordinator.  Her insulin dose has been adjusted. Was Lantus 5 units twice daily, NovoLog 70/30 4 units twice daily, and insulin sliding scale.  On discharge plan for Lantus 5 units twice daily insulin NovoLog 4 units before meals and monitoring sugar 4 times a day and follow-up with PCP.  This was further discussed with patient's daughter in detail who verbalized understanding. At this time she is medically stable for discharge home.   Subjective:  Alert, awake,resting comfortably. Sugar fluctuating.  Assessment & Plan:  Sepsis/pneumonia: Completed antibiotics 5 days at this time clinically stable afebrile   Type 2 diabetes mellitus, brittle with hypoglycemia and hyperglycemia: Overnight blood sugar in high 300 made cahnges on her Levemir and Premeal insulin . Sugar is improving this afternoon 170s continue current  insulin, d.c meds updated. Recent Labs  Lab 12/02/20 1606 12/02/20 2057 12/03/20 0429 12/03/20 0750 12/03/20 1227  GLUCAP 347* 227* 379* 308* 174*    Hyperkalemia due to AKI -resolved. Cont home [place] daily.  AKI on CKD stage II creatinine at this time stable continue outpatient follow-up.  Renal function stable Recent Labs   Lab 11/27/20 0317 11/29/20 0022 11/30/20 0350  BUN 12 15 15   CREATININE 1.37* 1.36* 1.33*    Nonischemic cardiomyopathy: Stable  lactic acidosis/NAGMA resolved History of CVA: On home DAPT, Lipitor Acute metabolic encephalopathy -resolved. Severe protein calorie malnutrition augment nutrition. Diet Order             Diet Carb Modified Fluid consistency: Thin; Room service appropriate? Yes  Diet effective now                   DVT prophylaxis: enoxaparin (LOVENOX) injection 40 mg Start: 11/26/20 1300 Code Status:   Code Status: DNR  Family Communication: plan of care discussed with patient at bedside. Status is: Inpatient Remains inpatient appropriate because:Inpatient level of care appropriate due to severity of illness Dispo:  Patient From: Home  Planned Disposition: Home with Health Care Svc  Medically stable for discharge: Yes     Unresulted Labs (From admission, onward)    None      Medications reviewed:  Scheduled Meds:  aspirin EC  81 mg Oral Daily   clopidogrel  75 mg Oral Daily   dronabinol  5 mg Oral BID AC   enoxaparin (LOVENOX) injection  40 mg Subcutaneous Q24H   feeding supplement  237 mL Oral TID BM   insulin aspart  5 Units Subcutaneous TID WC   insulin glargine  7 Units Subcutaneous BID   ivabradine  2.5 mg Oral BID WC   mirtazapine  7.5 mg Oral QHS   multivitamin with minerals  1 tablet Oral Daily   thiamine  100 mg Oral Daily   Continuous Infusions:  sodium chloride  Consultants:see note  Procedures:see note Antimicrobials: Anti-infectives (From admission, onward)    Start     Dose/Rate Route Frequency Ordered Stop   11/24/20 2200  cefTRIAXone (ROCEPHIN) 2 g in sodium chloride 0.9 % 100 mL IVPB  Status:  Discontinued        2 g 200 mL/hr over 30 Minutes Intravenous Every 24 hours 11/24/20 2146 11/30/20 0726   11/24/20 2200  azithromycin (ZITHROMAX) 500 mg in sodium chloride 0.9 % 250 mL IVPB  Status:  Discontinued        500  mg 250 mL/hr over 60 Minutes Intravenous Every 24 hours 11/24/20 2146 11/30/20 0726      Culture/Microbiology    Component Value Date/Time   SDES BLOOD SITE NOT SPECIFIED 11/24/2020 2155   SPECREQUEST  11/24/2020 2155    BOTTLES DRAWN AEROBIC AND ANAEROBIC Blood Culture adequate volume   CULT  11/24/2020 2155    NO GROWTH 5 DAYS Performed at [facility], [address], [place], [place] [address]    REPTSTATUS 11/29/2020 FINAL 11/24/2020 2155    Other culture-see note  Objective: Vitals: Today's Vitals   12/03/20 0400 12/03/20 0441 12/03/20 0753 12/03/20 1229  BP: (!) 100/57  (!) 103/55 (!) 95/58  Pulse: 70  76 75  Resp: 16  19 17   Temp: 98.5 F (36.9 C)  98.4 F (36.9 C) 98.3 F (36.8 C)  TempSrc: Axillary  Axillary Axillary  SpO2: 98%  97% 98%  Weight:      Height:      PainSc:  0-No pain      Intake/Output Summary (Last 24 hours) at 12/03/2020 1327 Last data filed at 12/03/2020 0926 Gross per 24 hour  Intake 50 ml  Output 650 ml  Net -600 ml   Filed Weights   11/28/20 0339 11/29/20 0400 11/30/20 0457  Weight: 47.6 kg 47.3 kg 46.4 kg   Weight change:   Intake/Output from previous day: 07/26 0701 - 07/27 0700 In: 120 [P.O.:120] Out: 650 [Urine:650] Intake/Output this shift: Total I/O In: 50 [P.O.:50] Out: -  Filed Weights   11/28/20 0339 11/29/20 0400 11/30/20 0457  Weight: 47.6 kg 47.3 kg 46.4 kg   Examination: General exam: AAO at baseline,older than stated age, weak appearing. HEENT:Oral mucosa moist, Ear/Nose WNL grossly,dentition normal. Respiratory system: bilaterally diminished,no use of accessory muscle, non tender. Cardiovascular system: S1 & S2 +,No JVD. Gastrointestinal system: Abdomen soft, NT,ND, BS+. Nervous System:Alert, awake, moving extremities Extremities: no edema, distal peripheral pulses palpable.  Skin: No rashes,no icterus. MSK: Normal muscle bulk,tone, power Data Reviewed: I have personally reviewed following labs  and imaging studies CBC: Recent Labs  Lab 11/27/20 0317  WBC 13.2*  HGB 8.4*  HCT 26.8*  MCV 100.8*  PLT 245   Basic Metabolic Panel: Recent Labs  Lab 11/27/20 0317 11/29/20 0022 11/30/20 0350  NA 138 136 135  K 5.6* 5.0 4.4  CL 104 99 100  CO2 23 28 27   GLUCOSE 301* 345* 335*  BUN 12 15 15   CREATININE 1.37* 1.36* 1.33*  CALCIUM 9.1 9.3 9.3   GFR: Estimated Creatinine Clearance: 33.8 mL/min (A) (by C-G formula based on SCr of 1.33 mg/dL (H)). Liver Function Tests: No results for input(s): AST, ALT, ALKPHOS, BILITOT, PROT, ALBUMIN in the last 168 hours. No results for input(s): LIPASE, AMYLASE in the last 168 hours. No results for input(s): AMMONIA in the last 168 hours. Coagulation Profile: No results for input(s): INR, PROTIME in the last 168 hours. Cardiac  Enzymes: No results for input(s): CKTOTAL, CKMB, CKMBINDEX, TROPONINI in the last 168 hours. BNP (last 3 results) No results for input(s): PROBNP in the last 8760 hours. HbA1C: No results for input(s): HGBA1C in the last 72 hours. CBG: Recent Labs  Lab 12/02/20 1606 12/02/20 2057 12/03/20 0429 12/03/20 0750 12/03/20 1227  GLUCAP 347* 227* 379* 308* 174*   Lipid Profile: No results for input(s): CHOL, HDL, LDLCALC, TRIG, CHOLHDL, LDLDIRECT in the last 72 hours. Thyroid Function Tests: No results for input(s): TSH, T4TOTAL, FREET4, T3FREE, THYROIDAB in the last 72 hours. Anemia Panel: No results for input(s): VITAMINB12, FOLATE, FERRITIN, TIBC, IRON, RETICCTPCT in the last 72 hours. Sepsis Labs: Recent Labs  Lab 11/29/20 0022  PROCALCITON 1.94    Recent Results (from the past 240 hour(s))  Urine Culture     Status: Abnormal   Collection Time: 11/24/20  9:19 PM   Specimen: In/Out Cath Urine  Result Value Ref Range Status   Specimen Description IN/OUT CATH URINE  Final   Special Requests   Final    NONE Performed at [facility], [address] [place], [place] [address]    Culture (A)   Final    10,000 COLONIES/mL MULTIPLE SPECIES PRESENT, SUGGEST RECOLLECTION   Report Status 11/26/2020 FINAL  Final  Blood culture (routine single)     Status: None   Collection Time: 11/24/20  9:46 PM   Specimen: BLOOD  Result Value Ref Range Status   Specimen Description BLOOD SITE NOT SPECIFIED  Final   Special Requests   Final    BOTTLES DRAWN AEROBIC AND ANAEROBIC Blood Culture adequate volume   Culture   Final    NO GROWTH 5 DAYS Performed at [facility], [address], [place], [place] [address]    Report Status 11/29/2020 FINAL  Final  Culture, blood (single)     Status: None   Collection Time: 11/24/20  9:55 PM   Specimen: BLOOD  Result Value Ref Range Status   Specimen Description BLOOD SITE NOT SPECIFIED  Final   Special Requests   Final    BOTTLES DRAWN AEROBIC AND ANAEROBIC Blood Culture adequate volume   Culture   Final    NO GROWTH 5 DAYS Performed at [facility], [address] [place], [place] [address]    Report Status 11/29/2020 FINAL  Final     Radiology Studies: No results found.   LOS: 8 days   Lanae Boast, MD [facility]  12/03/2020, 1:27 PM

## 2020-12-04 ENCOUNTER — Telehealth: Payer: Self-pay | Admitting: Student

## 2020-12-04 ENCOUNTER — Other Ambulatory Visit (HOSPITAL_COMMUNITY): Payer: Self-pay

## 2020-12-04 NOTE — Telephone Encounter (Signed)
Attempted to reach patient on her cell [phone]) to offer to schedule a Palliative Consult but this was not a working number.  I then attempted to reach patient at the listed home number [phone]) and this number is for [facility], which is a SNF and Rehab facility.  I then attempted to reach daughter [name] at listed number [phone]) and left a VM requesting a return call to possibly schedule the Consult. I also called and spoke with patient's mother, [name], to let her know that I was trying to get in touch with patient and she gave me a different number than what was provided for the patient's daughter [name] which was [phone]).  [name] stated that patient's daughter was currently with patient at patient's home.  I attempted to reach Latori at new number with no answer and unable to leave a message due to no VM was set up.  I called patient's mother back to let her know that I wasn't able to speak with Latori but rec'd a message stating that the number I called was not accepting any calls.  1:35 PM:  Rec'd a return call from patient's mother [name] and I told her that I was not able to speak with [facility] and wasn't able to leave her a message and [name] said that she was going over to patient's home shortly and she would give Latori the number to call me back to schedule a visit.

## 2020-12-04 NOTE — Progress Notes (Signed)
Patient did not show for appointment.   

## 2020-12-05 ENCOUNTER — Encounter: Payer: Self-pay | Admitting: Family

## 2020-12-05 DIAGNOSIS — E875 Hyperkalemia: Secondary | ICD-10-CM

## 2020-12-05 DIAGNOSIS — N182 Chronic kidney disease, stage 2 (mild): Secondary | ICD-10-CM

## 2020-12-05 DIAGNOSIS — I428 Other cardiomyopathies: Secondary | ICD-10-CM

## 2020-12-05 DIAGNOSIS — G9341 Metabolic encephalopathy: Secondary | ICD-10-CM

## 2020-12-05 DIAGNOSIS — A419 Sepsis, unspecified organism: Secondary | ICD-10-CM

## 2020-12-05 DIAGNOSIS — J189 Pneumonia, unspecified organism: Secondary | ICD-10-CM

## 2020-12-05 DIAGNOSIS — N179 Acute kidney failure, unspecified: Secondary | ICD-10-CM

## 2020-12-05 DIAGNOSIS — Z8673 Personal history of transient ischemic attack (TIA), and cerebral infarction without residual deficits: Secondary | ICD-10-CM

## 2020-12-05 DIAGNOSIS — Z09 Encounter for follow-up examination after completed treatment for conditions other than malignant neoplasm: Secondary | ICD-10-CM

## 2020-12-05 DIAGNOSIS — Z7689 Persons encountering health services in other specified circumstances: Secondary | ICD-10-CM

## 2020-12-05 DIAGNOSIS — E119 Type 2 diabetes mellitus without complications: Secondary | ICD-10-CM

## 2020-12-05 DIAGNOSIS — E872 Acidosis: Secondary | ICD-10-CM

## 2020-12-09 ENCOUNTER — Other Ambulatory Visit: Payer: Self-pay | Admitting: Student

## 2020-12-09 ENCOUNTER — Other Ambulatory Visit: Payer: Self-pay

## 2020-12-09 DIAGNOSIS — Z794 Long term (current) use of insulin: Secondary | ICD-10-CM

## 2020-12-09 DIAGNOSIS — Z515 Encounter for palliative care: Secondary | ICD-10-CM

## 2020-12-09 DIAGNOSIS — R531 Weakness: Secondary | ICD-10-CM

## 2020-12-09 DIAGNOSIS — R110 Nausea: Secondary | ICD-10-CM

## 2020-12-09 DIAGNOSIS — E43 Unspecified severe protein-calorie malnutrition: Secondary | ICD-10-CM

## 2020-12-09 NOTE — Progress Notes (Signed)
Therapist, nutritional Palliative Care Consult Note Telephone: [phone]  Fax: [phone]   Date of encounter: 12/09/20 PATIENT NAME: Christine Benitez [address] [place] [address]   [phone] (home)  DOB: 1960-07-18 MRN: [id number] PRIMARY CARE PROVIDER:    Heide Guile, DNP, Dr. Burnell Blanks at [facility]  REFERRING PROVIDER:   Lupita Dawn, MD [address] [place],  [place] [address] [phone]  RESPONSIBLE PARTY:    Contact Information     Name Relation Home Work Mobile   Friedens Daughter [phone]  [phone]   [name] Mother   [phone]        I met face to face with patient and family in the home. Palliative Care was asked to follow this patient by consultation request of  Dr. Burnell Blanks to address advance care planning and complex medical decision making. This is the initial visit.                                     ASSESSMENT AND PLAN / RECOMMENDATIONS:   Advance Care Planning/Goals of Care: Goals include to maximize quality of life and symptom management. Patient/health care surrogate gave his/her permission to discuss.Our advance care planning conversation included a discussion about:    The value and importance of advance care planning  Experiences with loved ones who have been seriously ill or have died  Exploration of personal, cultural or spiritual beliefs that might influence medical decisions  Exploration of goals of care in the event of a sudden injury or illness  Discussed code status; she is a DNR. Decision not to resuscitate or to de-escalate disease focused treatments due to poor prognosis. CODE STATUS: DNR  Symptom Management/Plan:  Generalized weakness-patient with generalized weakness secondary to hx of CVA, hypoglycemia, multiple recent hospitalizations. Recommend HH PT/OT/SN services. Patient has a current referral with [facility].   T2DM-patient had  labile blood sugars, hypoglycemia complicating most recent hospital stay. Education provided on insulins, patient needing to eat. Patient was able to verbalize this back to NP and to daughter. Will have palliative nurse navigator reach out to PCP regarding order for glucometer. HH SN needed for additional teaching, education.   Nausea-Recommend ondansetron 4mg  every 8 hours PRN nausea, vomiting. Prescription sent to [facility] per request.    Protein calorie malnutrition-recommend eating small meals throughout the day; nutritional supplements encouraged. Recommend starting Marinol and Remeron once received from pharmacy to help with appetite stimulation.   Medication compliance-reviewed medications in the home. She does not have most of her current medications per recent discharge summary. She does have Lantus in the home; daughter was able to state correct dosage she should be using. Palliative Nurse navigator to contact [facility] pharmacy to see if patient's prescriptions can be transferred to her local pharmacy, [facility]. Education provided on hypo and hyperglycemia. Patient also needs a glucometer as none in the home. Patient would greatly benefit from [facility] due to medications and need for additional education/support.   There is a recognizable health care literacy and literacy deficit. Palliative SW notified to see if there any any additional supports available for patient. Patient's mother, [facility] arrived and she was assisted in filling out an appointment confirmation for for patient's upcoming appointment with Disability.   Follow up Palliative Care Visit: Palliative care will continue to follow for complex medical decision making, advance care planning,  and clarification of goals. Return in 4 weeks or prn.  I spent 60 minutes providing this consultation. More than 50% of the time in this consultation was spent in counseling and care coordination.    PPS: 40%  HOSPICE  ELIGIBILITY/DIAGNOSIS: TBD  Chief Complaint: Palliative Medicine initial consult.   HISTORY OF PRESENT ILLNESS:  NILI HONDA is a 60 y.o. year old female  with T2DM, nonischemic cardiomyopathy, hx of CVA. Patient recently hospitalized 7/18-7/27/22 due to sepsis, pneumonia.  Hospitalized 6/26-7/12/22 due to hypoglycemia and cachexia; hospitalized 6/17-6/21/22 due to UTI.   Patient currently resides at home with her daughter, [name]. Her daughter is the primary caregiver. Her mother [name] is also involved in her care and lives nearby. Patient has not started therapy or home health as of yet. She is supposed to be followed by [facility]. Patient does not have any current medications from recent discharge in the home. Daughter states that medications were sent to [place]. And she does not have transportation to retrieve medications. No glucometer is in the home. Daughter is giving insulin per discharge instructions. Patient denies having pain or shortness of breath. She does endorse occasional nausea. Patient endorses fair appetite; no nutritional supplements in the home. She reports sleeping well. She is ambulatory with assist to and from bathroom with walker. No falls recently. Patient uses medicaid bus for transportation to and from appointments. 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  Patient is received resting in bed. She is hard of hearing, but is able to repeat back for clarity. She does not exhibit any signs of pain or discomfort. She is observed walking to and from bathroom with rollator walker; gait is slow and steady; weakness noted. Patient does not make direct eye contact, although she states she can see NP and objects in her surroundings.      History obtained from review of EMR, discussion with primary team, and interview with family, facility staff/caregiver and/or Ms. Wrightson.  I reviewed available labs, medications, imaging, studies and related documents from  the EMR.  Records reviewed and summarized above.   ROS  General: NAD EYES: denies vision changes ENMT: denies dysphagia Cardiovascular: denies chest pain,  Pulmonary: denies cough, denies SOB Abdomen: denies constipation, endorses continence of bowel GU: denies dysuria MSK: weakness,  no falls reported Skin: denies rashes or wounds Neurological: denies pain, denies insomnia Psych: Endorses stable mood Heme/lymph/immuno: denies bruises, abnormal bleeding  Physical Exam: Most recent weight 102 pounds 118/62. Pulse 64, resp 16, sats 97% Constitutional: NAD General: frail appearing, very thin EYES: anicteric sclera, lids intact, no discharge  ENMT:hard of hearing, oral mucous membranes moist CV: S1S2, RRR, no LE edema Pulmonary: LCTA, no increased work of breathing, no cough, room air Abdomen: normo-active BS + 4 quadrants, soft and non tender, no ascites GU: deferred MSK: sarcopenia, moves all extremities, ambulatory Skin: cool and dry, no rashes or wounds on visible skin Neuro: generalized weakness, A & O x 3 Psych: non-anxious affect, pleasant Hem/lymph/immuno: no widespread bruising CURRENT PROBLEM LIST:  Patient Active Problem List   Diagnosis Date Noted   Severe sepsis ([place]) 11/25/2020   Sepsis due to pneumonia ([place])    Hyperkalemia 11/11/2020   Constipation 11/03/2020   Cachexia ([place]) 11/03/2020   Malnutrition of moderate degree 11/03/2020   Hypoglycemia 11/02/2020   Proteus mirabilis infection 10/25/2020   Acute lower UTI 10/24/2020   Urinary tract infection 10/24/2020   Failure to thrive in adult  10/24/2020   Pericardial effusion    Iron deficiency anemia    Chronic systolic CHF (congestive heart failure) (HCC)    Heart failure with reduced ejection fraction ([place]) 07/18/2020   History of CVA (cerebrovascular accident) 07/18/2020   Nonischemic cardiomyopathy ([place]) 07/18/2020   Type 2 diabetes mellitus with complication, with long-term current use of insulin  ([place]) 07/18/2020   Tobacco use 07/18/2020   Mixed hyperlipidemia 07/18/2020   Abnormal echocardiogram 07/18/2020   Resides in long term care facility 06/20/2020   Protein-calorie malnutrition, severe 03/24/2020   Pain    Altered mental status    DNR (do not resuscitate)    DNI (do not intubate)    Encounter for hospice care discussion    Goals of care, counseling/discussion    Palliative care by specialist    AKI (acute kidney injury) ([place])    Cardiogenic shock ([place]) 03/14/2020   Acute urinary retention 03/13/2020   Chronic cholecystitis with calculus 03/12/2020   Cerebral thrombosis with cerebral infarction 03/12/2020   Abnormal LFTs    Hypertension    NICM (nonischemic cardiomyopathy) ([place])    Pulmonary hypertension ([place])    Tobacco abuse 06/10/2014   Diabetes mellitus ([place]) 06/10/2014   Family history of premature CAD 06/10/2014   History of blood transfusion 1986   PAST MEDICAL HISTORY:  Active Ambulatory Problems    Diagnosis Date Noted   Tobacco abuse 06/10/2014   Diabetes mellitus ([place]) 06/10/2014   Family history of premature CAD 06/10/2014   Abnormal LFTs    Hypertension    NICM (nonischemic cardiomyopathy) ([place])    Pulmonary hypertension ([place])    Chronic cholecystitis with calculus 03/12/2020   Cerebral thrombosis with cerebral infarction 03/12/2020   Acute urinary retention 03/13/2020   Cardiogenic shock ([place]) 03/14/2020   AKI (acute kidney injury) ([place])    Goals of care, counseling/discussion    Palliative care by specialist    Altered mental status    DNR (do not resuscitate)    DNI (do not intubate)    Encounter for hospice care discussion    Pain    Protein-calorie malnutrition, severe 03/24/2020   Heart failure with reduced ejection fraction ([place]) 07/18/2020   History of CVA (cerebrovascular accident) 07/18/2020   Nonischemic cardiomyopathy ([place]) 07/18/2020   Type 2 diabetes mellitus with complication, with long-term current use of insulin ([place])  07/18/2020   Tobacco use 07/18/2020   Mixed hyperlipidemia 07/18/2020   Abnormal echocardiogram 07/18/2020   Pericardial effusion    Iron deficiency anemia    History of blood transfusion 5102   Chronic systolic CHF (congestive heart failure) ([place])    Resides in long term care facility 06/20/2020   Acute lower UTI 10/24/2020   Urinary tract infection 10/24/2020   Failure to thrive in adult 10/24/2020   Proteus mirabilis infection 10/25/2020   Hypoglycemia 11/02/2020   Constipation 11/03/2020   Cachexia ([place]) 11/03/2020   Malnutrition of moderate degree 11/03/2020   Hyperkalemia 11/11/2020   Severe sepsis ([place]) 11/25/2020   Sepsis due to pneumonia [facility])    Resolved Ambulatory Problems    Diagnosis Date Noted   Chronic HFrEF (heart failure with reduced ejection fraction) ([place]) 06/10/2014   Pericardial effusion    DKA (diabetic ketoacidoses) 06/26/2015   Diabetic ketoacidosis without coma associated with diabetes mellitus due to underlying condition ([place])    Hyperglycemia    Essential hypertension    Chronic systolic congestive heart failure ([place])    Chronic systolic heart failure ([place])  Acute cerebral ischemia 03/11/2020   Community acquired pneumonia    Past Medical History:  Diagnosis Date   Insulin dependent diabetes mellitus    SOCIAL HX:  Social History   Tobacco Use   Smoking status: Former    Packs/day: 0.50    Years: 22.00    Pack years: 11.00    Types: Cigarettes    Quit date: 03/26/2015    Years since quitting: 5.7   Smokeless tobacco: Never  Substance Use Topics   Alcohol use: No   FAMILY HX:  Family History  Problem Relation Age of Onset   Coronary artery disease Mother    Diabetes Mother    Lupus Mother    Stroke Mother    Lung cancer Father       ALLERGIES: No Known Allergies   PERTINENT MEDICATIONS:  Outpatient Encounter Medications as of 12/09/2020  Medication Sig   albuterol (VENTOLIN HFA) 108 (90 Base) MCG/ACT inhaler Inhale 2 puffs  into the lungs every 6 (six) hours as needed for wheezing or shortness of breath.   aspirin 81 MG EC tablet TAKE 1 TABLET (81 MG TOTAL) BY MOUTH DAILY.   atorvastatin (LIPITOR) 40 MG tablet Take 1 tablet (40 mg total) by mouth every evening.   clopidogrel (PLAVIX) 75 MG tablet Take 1 tablet (75 mg total) by mouth daily.   dronabinol (MARINOL) 5 MG capsule Take 1 capsule (5 mg total) by mouth 2 (two) times daily before lunch and supper.   feeding supplement (ENSURE ENLIVE / ENSURE PLUS) LIQD Take 237 mLs by mouth 2 (two) times daily between meals.   feeding supplement (ENSURE ENLIVE / ENSURE PLUS) LIQD Take 237 mLs by mouth 3 (three) times daily between meals.   insulin aspart (NOVOLOG FLEXPEN) 100 UNIT/ML FlexPen Inject 5 Units into the skin 3 (three) times daily with meals.   insulin glargine (LANTUS) 100 UNIT/ML Solostar Pen Inject 7 Units into the skin 2 (two) times daily.   Insulin Pen Needle (PENTIPS) 32G X 4 MM MISC Use as directed   ivabradine (CORLANOR) 5 MG TABS tablet Take 0.5 tablets (2.5 mg total) by mouth 2 (two) times daily with a meal.   mirtazapine (REMERON SOL-TAB) 15 MG disintegrating tablet Take 0.5 tablets (7.5 mg total) by mouth at bedtime.   Multiple Vitamin (MULTIVITAMIN WITH MINERALS) TABS tablet Take 1 tablet by mouth daily.   sodium zirconium cyclosilicate (LOKELMA) 10 g PACK packet Take 10 g by mouth daily.   [DISCONTINUED] insulin aspart protamine - aspart (NOVOLOG 70/30 MIX) (70-30) 100 UNIT/ML FlexPen Inject 0.04 mLs (4 Units total) into the skin 2 (two) times daily with a meal.   No facility-administered encounter medications on file as of 12/09/2020.   Thank you for the opportunity to participate in the care of Ms. Rickman.  The palliative care team will continue to follow. Please call our office at [phone] if we can be of additional assistance.   Ezekiel Slocumb, NP   COVID-19 PATIENT SCREENING TOOL Asked and negative response unless otherwise  noted:  Have you had symptoms of covid, tested positive or been in contact with someone with symptoms/positive test in the past 5-10 days? No

## 2020-12-10 ENCOUNTER — Other Ambulatory Visit (HOSPITAL_COMMUNITY): Payer: Self-pay

## 2020-12-10 LAB — GLUCOSE, CAPILLARY: Glucose-Capillary: 19 mg/dL — CL (ref 70–99)

## 2020-12-11 ENCOUNTER — Telehealth: Payer: Self-pay

## 2020-12-11 NOTE — Telephone Encounter (Signed)
At the request of Palliative NP, PCP office contacted to request order for home health be re submitted and to request an order for a glucometer sent to patient's pharmacy

## 2020-12-15 ENCOUNTER — Telehealth: Payer: Self-pay

## 2020-12-15 NOTE — Telephone Encounter (Signed)
12/15/20 @4 :30 pm :Palliative care SW outreached patients mother, , per [facility] NP - L. Rivers - request to assess needs.  Call unsuccessful. SW unable to LVM.   SW called patient and LVM.

## 2020-12-16 ENCOUNTER — Inpatient Hospital Stay: Payer: Self-pay | Admitting: Family Medicine

## 2020-12-25 ENCOUNTER — Encounter (HOSPITAL_COMMUNITY): Payer: Self-pay | Admitting: Internal Medicine

## 2020-12-25 ENCOUNTER — Inpatient Hospital Stay (HOSPITAL_COMMUNITY)
Admission: EM | Admit: 2020-12-25 | Discharge: 2020-12-27 | DRG: 638 | Disposition: A | Discharge status: Home Health | Payer: Medicaid Other | Attending: Internal Medicine | Admitting: Internal Medicine

## 2020-12-25 DIAGNOSIS — I110 Hypertensive heart disease with heart failure: Secondary | ICD-10-CM | POA: Diagnosis present

## 2020-12-25 DIAGNOSIS — E1110 Type 2 diabetes mellitus with ketoacidosis without coma: Secondary | ICD-10-CM | POA: Diagnosis present

## 2020-12-25 DIAGNOSIS — Z823 Family history of stroke: Secondary | ICD-10-CM

## 2020-12-25 DIAGNOSIS — I428 Other cardiomyopathies: Secondary | ICD-10-CM | POA: Diagnosis present

## 2020-12-25 DIAGNOSIS — Z87891 Personal history of nicotine dependence: Secondary | ICD-10-CM

## 2020-12-25 DIAGNOSIS — I5032 Chronic diastolic (congestive) heart failure: Secondary | ICD-10-CM

## 2020-12-25 DIAGNOSIS — N179 Acute kidney failure, unspecified: Secondary | ICD-10-CM

## 2020-12-25 DIAGNOSIS — Z832 Family history of diseases of the blood and blood-forming organs and certain disorders involving the immune mechanism: Secondary | ICD-10-CM

## 2020-12-25 DIAGNOSIS — Z8249 Family history of ischemic heart disease and other diseases of the circulatory system: Secondary | ICD-10-CM

## 2020-12-25 DIAGNOSIS — Z794 Long term (current) use of insulin: Secondary | ICD-10-CM

## 2020-12-25 DIAGNOSIS — Z20822 Contact with and (suspected) exposure to covid-19: Secondary | ICD-10-CM | POA: Diagnosis present

## 2020-12-25 DIAGNOSIS — Z66 Do not resuscitate: Secondary | ICD-10-CM | POA: Diagnosis present

## 2020-12-25 DIAGNOSIS — Z801 Family history of malignant neoplasm of trachea, bronchus and lung: Secondary | ICD-10-CM

## 2020-12-25 DIAGNOSIS — R112 Nausea with vomiting, unspecified: Secondary | ICD-10-CM

## 2020-12-25 DIAGNOSIS — E1310 Other specified diabetes mellitus with ketoacidosis without coma: Secondary | ICD-10-CM

## 2020-12-25 DIAGNOSIS — E1010 Type 1 diabetes mellitus with ketoacidosis without coma: Principal | ICD-10-CM | POA: Diagnosis present

## 2020-12-25 DIAGNOSIS — Z833 Family history of diabetes mellitus: Secondary | ICD-10-CM

## 2020-12-25 DIAGNOSIS — E860 Dehydration: Secondary | ICD-10-CM

## 2020-12-25 LAB — CBC WITH DIFFERENTIAL/PLATELET
Abs Immature Granulocytes: 0.01 10*3/uL (ref 0.00–0.07)
Basophils Absolute: 0.1 10*3/uL (ref 0.0–0.1)
Basophils Relative: 1 %
Eosinophils Absolute: 0 10*3/uL (ref 0.0–0.5)
Eosinophils Relative: 1 %
HCT: 41.3 % (ref 36.0–46.0)
Hemoglobin: 13 g/dL (ref 12.0–15.0)
Immature Granulocytes: 0 %
Lymphocytes Relative: 21 %
Lymphs Abs: 1.2 10*3/uL (ref 0.7–4.0)
MCH: 31.3 pg (ref 26.0–34.0)
MCHC: 31.5 g/dL (ref 30.0–36.0)
MCV: 99.3 fL (ref 80.0–100.0)
Monocytes Absolute: 0.5 10*3/uL (ref 0.1–1.0)
Monocytes Relative: 8 %
Neutro Abs: 4 10*3/uL (ref 1.7–7.7)
Neutrophils Relative %: 69 %
Platelets: 209 10*3/uL (ref 150–400)
RBC: 4.16 MIL/uL (ref 3.87–5.11)
RDW: 12.5 % (ref 11.5–15.5)
WBC: 5.8 10*3/uL (ref 4.0–10.5)
nRBC: 0 % (ref 0.0–0.2)

## 2020-12-25 LAB — I-STAT VENOUS BLOOD GAS, ED
Acid-base deficit: 8 mmol/L — ABNORMAL HIGH (ref 0.0–2.0)
Bicarbonate: 18.8 mmol/L — ABNORMAL LOW (ref 20.0–28.0)
Calcium, Ion: 1.25 mmol/L (ref 1.15–1.40)
HCT: 37 % (ref 36.0–46.0)
Hemoglobin: 12.6 g/dL (ref 12.0–15.0)
O2 Saturation: 98 %
Potassium: 4.8 mmol/L (ref 3.5–5.1)
Sodium: 132 mmol/L — ABNORMAL LOW (ref 135–145)
TCO2: 20 mmol/L — ABNORMAL LOW (ref 22–32)
pCO2, Ven: 42.4 mmHg — ABNORMAL LOW (ref 44.0–60.0)
pH, Ven: 7.253 (ref 7.250–7.430)
pO2, Ven: 112 mmHg — ABNORMAL HIGH (ref 32.0–45.0)

## 2020-12-25 LAB — CBG MONITORING, ED
Glucose-Capillary: 443 mg/dL — ABNORMAL HIGH (ref 70–99)
Glucose-Capillary: 379 mg/dL — ABNORMAL HIGH (ref 70–99)
Glucose-Capillary: 381 mg/dL — ABNORMAL HIGH (ref 70–99)
Glucose-Capillary: 393 mg/dL — ABNORMAL HIGH (ref 70–99)

## 2020-12-25 LAB — COMPREHENSIVE METABOLIC PANEL
ALT: 11 U/L (ref 0–44)
AST: 17 U/L (ref 15–41)
Albumin: 3.8 g/dL (ref 3.5–5.0)
Alkaline Phosphatase: 54 U/L (ref 38–126)
Anion gap: 20 — ABNORMAL HIGH (ref 5–15)
BUN: 38 mg/dL — ABNORMAL HIGH (ref 6–20)
CO2: 12 mmol/L — ABNORMAL LOW (ref 22–32)
Calcium: 9.8 mg/dL (ref 8.9–10.3)
Chloride: 96 mmol/L — ABNORMAL LOW (ref 98–111)
Creatinine, Ser: 2 mg/dL — ABNORMAL HIGH (ref 0.44–1.00)
GFR, Estimated: 28 mL/min — ABNORMAL LOW (ref >60)
Glucose, Bld: 466 mg/dL — ABNORMAL HIGH (ref 70–99)
Potassium: 4.7 mmol/L (ref 3.5–5.1)
Sodium: 128 mmol/L — ABNORMAL LOW (ref 135–145)
Total Bilirubin: 2.1 mg/dL — ABNORMAL HIGH (ref 0.3–1.2)
Total Protein: 7.2 g/dL (ref 6.5–8.1)

## 2020-12-25 LAB — BETA-HYDROXYBUTYRIC ACID: Beta-Hydroxybutyric Acid: >8 mmol/L — ABNORMAL HIGH (ref 0.05–0.27)

## 2020-12-25 LAB — MAGNESIUM: Magnesium: 2.2 mg/dL (ref 1.7–2.4)

## 2020-12-25 LAB — BASIC METABOLIC PANEL
Anion gap: 17 — ABNORMAL HIGH (ref 5–15)
BUN: 31 mg/dL — ABNORMAL HIGH (ref 6–20)
CO2: 16 mmol/L — ABNORMAL LOW (ref 22–32)
Calcium: 9.6 mg/dL (ref 8.9–10.3)
Chloride: 99 mmol/L (ref 98–111)
Creatinine, Ser: 1.56 mg/dL — ABNORMAL HIGH (ref 0.44–1.00)
GFR, Estimated: 38 mL/min — ABNORMAL LOW (ref >60)
Glucose, Bld: 311 mg/dL — ABNORMAL HIGH (ref 70–99)
Potassium: 4.5 mmol/L (ref 3.5–5.1)
Sodium: 132 mmol/L — ABNORMAL LOW (ref 135–145)

## 2020-12-25 LAB — GLUCOSE, CAPILLARY
Glucose-Capillary: 370 mg/dL — ABNORMAL HIGH (ref 70–99)
Glucose-Capillary: 240 mg/dL — ABNORMAL HIGH (ref 70–99)

## 2020-12-25 MED ORDER — LACTATED RINGERS IV BOLUS
20.0000 mL/kg | Freq: Once | INTRAVENOUS | Status: AC
  Administered 2020-12-25: 926 mL via INTRAVENOUS

## 2020-12-25 MED ORDER — ONDANSETRON HCL 4 MG/2ML IJ SOLN: 4.0000 mg | Freq: Four times a day (QID) | INTRAMUSCULAR | Status: DC | PRN

## 2020-12-25 MED ORDER — LACTATED RINGERS IV SOLN: INTRAVENOUS | Status: DC

## 2020-12-25 MED ORDER — IVABRADINE HCL 5 MG PO TABS
2.5000 mg | ORAL_TABLET | Freq: Two times a day (BID) | ORAL | Status: DC
  Administered 2020-12-26 – 2020-12-27 (×3): 2.5 mg via ORAL
  Filled 2020-12-25 (×4): qty 1

## 2020-12-25 MED ORDER — KCL IN DEXTROSE-NACL 10-5-0.45 MEQ/L-%-% IV SOLN
INTRAVENOUS | Status: DC
  Filled 2020-12-25 (×6): qty 1000

## 2020-12-25 MED ORDER — DEXTROSE 50 % IV SOLN: 0.0000 mL | INTRAVENOUS | Status: DC | PRN

## 2020-12-25 MED ORDER — DEXTROSE IN LACTATED RINGERS 5 % IV SOLN: INTRAVENOUS | Status: DC

## 2020-12-25 MED ORDER — SODIUM CHLORIDE 0.45 % IV SOLN
INTRAVENOUS | Status: DC
  Filled 2020-12-25: qty 1000

## 2020-12-25 MED ORDER — ACETAMINOPHEN 325 MG PO TABS: 650.0000 mg | ORAL_TABLET | Freq: Four times a day (QID) | ORAL | Status: DC | PRN

## 2020-12-25 MED ORDER — ACETAMINOPHEN 650 MG RE SUPP: 650.0000 mg | Freq: Four times a day (QID) | RECTAL | Status: DC | PRN

## 2020-12-25 MED ORDER — CLOPIDOGREL BISULFATE 75 MG PO TABS
75.0000 mg | ORAL_TABLET | Freq: Every day | ORAL | Status: DC
  Administered 2020-12-26 – 2020-12-27 (×2): 75 mg via ORAL
  Filled 2020-12-25 (×2): qty 1

## 2020-12-25 MED ORDER — POTASSIUM CHLORIDE 10 MEQ/100ML IV SOLN
10.0000 meq | INTRAVENOUS | Status: DC
Start: 2020-12-25 — End: 2020-12-25

## 2020-12-25 MED ORDER — INSULIN REGULAR(HUMAN) IN NACL 100-0.9 UT/100ML-% IV SOLN
INTRAVENOUS | Status: DC
  Administered 2020-12-25: 3.6 [IU]/h via INTRAVENOUS
  Administered 2020-12-25: 2.6 [IU]/h via INTRAVENOUS
  Filled 2020-12-25: qty 100

## 2020-12-25 MED ORDER — ALBUTEROL SULFATE (2.5 MG/3ML) 0.083% IN NEBU: 3.0000 mL | INHALATION_SOLUTION | Freq: Four times a day (QID) | RESPIRATORY_TRACT | Status: DC | PRN

## 2020-12-25 MED ORDER — ASPIRIN EC 81 MG PO TBEC
81.0000 mg | DELAYED_RELEASE_TABLET | Freq: Every day | ORAL | Status: DC
  Administered 2020-12-26 – 2020-12-27 (×2): 81 mg via ORAL
  Filled 2020-12-25 (×2): qty 1

## 2020-12-25 NOTE — ED Notes (Signed)
Pt resting in stretcher. Very hard of hearing. Dr Tegeler rounded on pt. EMT checking blood sugar at this time

## 2020-12-25 NOTE — Progress Notes (Signed)
Inpatient Diabetes Program Recommendations  AACE/ADA: New Consensus Statement on Inpatient Glycemic Control (2015)  Target Ranges:  Prepandial:   less than 140 mg/dL      Peak postprandial:   less than 180 mg/dL (1-2 hours)      Critically ill patients:  140 - 180 mg/dL   Lab Results  Component Value Date   GLUCAP 443 (H) 12/25/2020   HGBA1C 7.7 (H) 10/24/2020    Review of Glycemic Control Results for Christine Benitez, Christine Benitez (MRN [id number]) as of 12/25/2020 14:13  Ref. Range 12/25/2020 14:03  Glucose-Capillary Latest Ref Range: 70 - 99 mg/dL 010 (H)   Diabetes history: DM2 Outpatient Diabetes medications: Lantus 7 units BID and Novolog 5 units TID Current orders for Inpatient glycemic control: None  Inpatient Diabetes Program Recommendations:    Novlog 0-6 units Q4H.  Patient's family was counseled by the inpatient DM team in July 2022.    Will continue to follow while inpatient.  Thank you, Dulce Sellar, RN, BSN Diabetes Coordinator Inpatient Diabetes Program [phone] (team pager from 8a-5p)

## 2020-12-25 NOTE — ED Triage Notes (Signed)
Pt via EMS for hyperglycemia x 3 days. Family unable to provide EMS with whether she has been taking insulin or not. Pt slow to respond on arrival to ED but EMS sts that when she does answer questions she answers appropriately. A/O x 3.

## 2020-12-25 NOTE — ED Provider Notes (Addendum)
[place] EMERGENCY DEPARTMENT Provider Note   CSN: [id number] Arrival date & time: 12/25/20  1358     History No chief complaint on file.   Christine Benitez is a 60 y.o. female.  HPI  Patient came in after her daughter called EMS because she was unresponsive. Daughter not at bedside, patient hard of hearing and slightly confused but able to answer most questions. Says she wasn't feeling well today but doesn't remember what happened until EMS arrived and she was told that she had really high blood sugar and that she was sick. Does not endorse any missed doses of her insulin. Has been having some belly pain and nausea but denies any other symptoms of cough or shortness of breath. She also says that she has had to go to the hospital in the past because of her sugar.    Past Medical History:  Diagnosis Date   Abnormal LFTs    a. 05/2014 -  ALT 52, alk phos 130.   Chronic systolic CHF (congestive heart failure) ([place])    a. Dx 05/2014 - echo at [facility] - EF 30-35%, moderate LVH, no rWMA, mild to moderate MR, moderate PH with PA pressure 60-37mHg, mild to moderate pericardial effusion. EF 30-35% by cath.   History of blood transfusion 1986   "related to C-section"   Hypertension    Insulin dependent diabetes mellitus    Iron deficiency anemia    NICM (nonischemic cardiomyopathy) ([facility]    a. [place]2/2/16:  minor nonobstructive CAD with mild irregularities in the LAD less than 10-20%, LCx with mild disease less than 20%, no significant disease in the RCA, EF 30-35%.   Pericardial effusion    a. [facility]   Pulmonary hypertension ([facility]    a. [facility]   Tobacco abuse     Patient Active Problem List   Diagnosis Date Noted   DKA (diabetic ketoacidosis) ([place] 12/25/2020   Severe sepsis ([facility] 11/25/2020   Sepsis due to pneumonia ([facility]    Hyperkalemia 11/11/2020   Constipation 11/03/2020   Cachexia  ([facility] 11/03/2020   Malnutrition of moderate degree 11/03/2020   Hypoglycemia 11/02/2020   Proteus mirabilis infection 10/25/2020   Acute lower UTI 10/24/2020   Urinary tract infection 10/24/2020   Failure to thrive in adult 10/24/2020   Pericardial effusion    Iron deficiency anemia    Chronic systolic CHF (congestive heart failure) ([facility]    Heart failure with reduced ejection fraction ([facility] 07/18/2020   History of CVA (cerebrovascular accident) 07/18/2020   Nonischemic cardiomyopathy ([place] 07/18/2020   Type 2 diabetes mellitus with complication, with long-term current use of insulin ([place] 07/18/2020   Tobacco use 07/18/2020   Mixed hyperlipidemia 07/18/2020   Abnormal echocardiogram 07/18/2020   Resides in long term care facility 06/20/2020   Protein-calorie malnutrition, severe 03/24/2020   Pain    Altered mental status    DNR (do not resuscitate)    DNI (do not intubate)    Encounter for hospice care discussion    Goals of care, counseling/discussion    Palliative care by specialist    AKI (acute kidney injury) ([facility]    Cardiogenic shock ([facility] 03/14/2020   Acute urinary retention 03/13/2020   Chronic cholecystitis with calculus 03/12/2020   Cerebral thrombosis with cerebral infarction 03/12/2020   Abnormal LFTs    Hypertension    NICM (nonischemic cardiomyopathy) ([facility]    Pulmonary hypertension ([facility]  Tobacco abuse 06/10/2014   Diabetes mellitus (HCC) 06/10/2014   Family history of premature CAD 06/10/2014   History of blood transfusion 1986    Past Surgical History:  Procedure Laterality Date   APPENDECTOMY  ~ 2003   CESAREAN SECTION  1986   LEFT HEART CATHETERIZATION WITH CORONARY ANGIOGRAM N/A 06/11/2014   Procedure: LEFT HEART CATHETERIZATION WITH CORONARY ANGIOGRAM;  Surgeon: Peter M Jordan, MD;  Location: [facility] CATH LAB;  Service: Cardiovascular;  Laterality: N/A;     OB History   No obstetric history on file.     Family History  Problem Relation Age of  Onset   Coronary artery disease Mother    Diabetes Mother    Lupus Mother    Stroke Mother    Lung cancer Father     Social History   Tobacco Use   Smoking status: Former    Packs/day: 0.50    Years: 22.00    Pack years: 11.00    Types: Cigarettes    Quit date: 03/26/2015    Years since quitting: 5.7   Smokeless tobacco: Never  Vaping Use   Vaping Use: Never used  Substance Use Topics   Alcohol use: No   Drug use: No    Home Medications Prior to Admission medications   Medication Sig Start Date End Date Taking? Authorizing Provider  albuterol (VENTOLIN HFA) 108 (90 Base) MCG/ACT inhaler Inhale 2 puffs into the lungs every 6 (six) hours as needed for wheezing or shortness of breath. 10/28/20   Woods, Curtis J, MD  aspirin 81 MG EC tablet TAKE 1 TABLET (81 MG TOTAL) BY MOUTH DAILY. 09/10/14   Jordan, Peter M, MD  atorvastatin (LIPITOR) 40 MG tablet Take 1 tablet (40 mg total) by mouth every evening. 03/26/20   Braswell, Phillip, MD  clopidogrel (PLAVIX) 75 MG tablet Take 1 tablet (75 mg total) by mouth daily. 10/28/20 10/28/21  Woods, Curtis J, MD  dronabinol (MARINOL) 5 MG capsule Take 1 capsule (5 mg total) by mouth 2 (two) times daily before lunch and supper. 10/28/20   Woods, Curtis J, MD  feeding supplement (ENSURE ENLIVE / ENSURE PLUS) LIQD Take 237 mLs by mouth 2 (two) times daily between meals. 11/06/20   Akula, Vijaya, MD  feeding supplement (ENSURE ENLIVE / ENSURE PLUS) LIQD Take 237 mLs by mouth 3 (three) times daily between meals. 11/18/20   Griffith, Kelly A, DO  insulin aspart (NOVOLOG FLEXPEN) 100 UNIT/ML FlexPen Inject 5 Units into the skin 3 (three) times daily with meals. 12/03/20   Kc, Ramesh, MD  insulin glargine (LANTUS) 100 UNIT/ML Solostar Pen Inject 7 Units into the skin 2 (two) times daily. 12/03/20   Kc, Ramesh, MD  Insulin Pen Needle (PENTIPS) 32G X 4 MM MISC Use as directed 11/28/20   Hall, Carole N, DO  ivabradine (CORLANOR) 5 MG TABS tablet Take 0.5 tablets  (2.5 mg total) by mouth 2 (two) times daily with a meal. 10/16/20   Simmons, Brittainy M, PA-C  mirtazapine (REMERON SOL-TAB) 15 MG disintegrating tablet Take 0.5 tablets (7.5 mg total) by mouth at bedtime. 03/26/20   Braswell, Phillip, MD  Multiple Vitamin (MULTIVITAMIN WITH MINERALS) TABS tablet Take 1 tablet by mouth daily. 11/07/20   Akula, Vijaya, MD  sodium zirconium cyclosilicate (LOKELMA) 10 g PACK packet Take 10 g by mouth daily. 12/02/20 01/01/21  Kc, Ramesh, MD  insulin aspart protamine - aspart (NOVOLOG 70/30 MIX) (70-30) 100 UNIT/ML FlexPen Inject 0.04 mLs (4 Units total) into the   skin 2 (two) times daily with a meal. 11/28/20 12/01/20  Hall, Carole N, DO    Allergies    Patient has no known allergies.  Review of Systems   Review of Systems  Gastrointestinal:  Positive for abdominal pain and nausea.  Neurological:  Positive for weakness.   Physical Exam Updated Vital Signs BP 109/78   Pulse 88   Temp 98 F (36.7 C)   Resp 14   Wt 46.3 kg Comment: Pt chart from 11/30/20  SpO2 99%   BMI 17.51 kg/m   Physical Exam Constitutional:      General: She is not in acute distress. HENT:     Head: Normocephalic and atraumatic.  Cardiovascular:     Rate and Rhythm: Regular rhythm. Tachycardia present.  Pulmonary:     Effort: Pulmonary effort is normal.     Breath sounds: Normal breath sounds.  Abdominal:     General: Abdomen is flat.     Palpations: Abdomen is soft.     Tenderness: There is abdominal tenderness.  Musculoskeletal:     Right lower leg: No edema.     Left lower leg: No edema.    ED Results / Procedures / Treatments   Labs (all labs ordered are listed, but only abnormal results are displayed) Labs Reviewed  COMPREHENSIVE METABOLIC PANEL - Abnormal; Notable for the following components:      Result Value   Sodium 128 (*)    Chloride 96 (*)    CO2 12 (*)    Glucose, Bld 466 (*)    BUN 38 (*)    Creatinine, Ser 2.00 (*)    Total Bilirubin 2.1 (*)    GFR,  Estimated 28 (*)    Anion gap 20 (*)    All other components within normal limits  BETA-HYDROXYBUTYRIC ACID - Abnormal; Notable for the following components:   Beta-Hydroxybutyric Acid >8.00 (*)    All other components within normal limits  CBG MONITORING, ED - Abnormal; Notable for the following components:   Glucose-Capillary 443 (*)    All other components within normal limits  CBG MONITORING, ED - Abnormal; Notable for the following components:   Glucose-Capillary 379 (*)    All other components within normal limits  SARS CORONAVIRUS 2 (TAT 6-24 HRS)  CBC WITH DIFFERENTIAL/PLATELET  URINALYSIS, ROUTINE W REFLEX MICROSCOPIC  BASIC METABOLIC PANEL  BASIC METABOLIC PANEL  MAGNESIUM  PHOSPHORUS  MAGNESIUM  COMPREHENSIVE METABOLIC PANEL  CBC  I-STAT VENOUS BLOOD GAS, ED    EKG None  Radiology No results found.  Procedures Procedures   Medications Ordered in ED Medications  lactated ringers bolus 926 mL (has no administration in time range)  insulin regular, human (MYXREDLIN) 100 units/ 100 mL infusion (has no administration in time range)  lactated ringers infusion (has no administration in time range)  dextrose 50 % solution 0-50 mL (has no administration in time range)  acetaminophen (TYLENOL) tablet 650 mg (has no administration in time range)    Or  acetaminophen (TYLENOL) suppository 650 mg (has no administration in time range)  sodium chloride 0.45 % 1,000 mL with potassium chloride 10 mEq infusion (has no administration in time range)  dextrose 5 % and 0.45 % NaCl with KCl 10 mEq/L infusion (has no administration in time range)    ED Course  I have reviewed the triage vital signs and the nursing notes.  Pertinent labs & imaging results that were available during my care of the patient were reviewed by   me and considered in my medical decision making (see chart for details).    MDM Rules/Calculators/A&P                           Patient is in DKA with  elevated BHB, CBG of 443. Will need admission for IV insulin and fluids. K is WNL, Na low at 128. Cr elevated to 2.0. Patient made NPO, started fluids with Q4 BMP and hospitalist consulted for admit.  Final Clinical Impression(s) / ED Diagnoses Final diagnoses:  None    Rx / DC Orders ED Discharge Orders     None        , , MD 12/25/20 1940    , , MD 12/25/20 2046    Tegeler, Christopher J, MD 12/26/20 0030  

## 2020-12-25 NOTE — ED Provider Notes (Signed)
Emergency Medicine Provider Triage Evaluation Note  Christine Benitez , a 60 y.o. female  was evaluated in triage.  Pt complains of hyperglycemia.  EMS reports family reported hyperglycemia x3 days.  Patient reports that she has been taking insulin as prescribed, last took today.  Patient denies any pain or discomfort.  Review of Systems  Positive: Hyperglycemia Negative: Abdominal pain, nausea, vomiting,   Physical Exam  BP 114/82   Pulse (!) 108   Temp 98 F (36.7 C)   Resp 16   SpO2 99%  Gen:   Awake, no distress   Resp:  Normal effort, lungs clear to auscultation bilaterally MSK:   Moves extremities without difficulty  Other:  Abdomen soft, nondistended, nontender.  No facial asymmetry, grip strength equal, pupils PERRL, EOM intact bilaterally.  Medical Decision Making  Medically screening exam initiated at 2:03 PM.  Appropriate orders placed.  Christine Benitez was informed that the remainder of the evaluation will be completed by another provider, this initial triage assessment does not replace that evaluation, and the importance of remaining in the ED until their evaluation is complete.  The patient appears stable so that the remainder of the work up may be completed by another provider.      Haskel Schroeder, PA-C 12/25/20 1409    Wynetta Fines, MD 12/27/20 1447

## 2020-12-25 NOTE — H&P (Signed)
History and Physical    PLEASE NOTE THAT DRAGON DICTATION SOFTWARE WAS USED IN THE CONSTRUCTION OF THIS NOTE.   BRYNLEY CUDDEBACK JQZ:[id number] DOB: [birth year] DOA: 12/25/2020  PCP: Claiborne Billings, MD Patient coming from: home   I have personally briefly reviewed patient's old medical records in [place]  Chief Complaint: Nausea and vomiting  HPI: Christine Benitez is a 60 y.o. female with medical history significant for type 1 diabetes mellitus, chronic diastolic heart failure in the setting of nonischemic cardiomyopathy, who is admitted to [facility] on 12/25/2020 with suspected diabetic ketoacidosis after presenting from home to [facility] ED complaining of nausea vomiting.   The patient reports 1 day of nausea resulting in 3 episodes of nonbloody, nonbilious emesis over that timeframe, with most recent such episode of emesis occurring just prior to presentation to [facility] emergency department today.  She confirms a history of underlying diabetes, and reports good compliance on her home basal and short acting insulin regimen, which consists of Lantus 7 units twice daily as well as NovoLog 5 units 3 times daily with meals.  She denies any recent missed doses of either her basal or short acting insulin.  She denies any recent subjective fever, chills, rigors, or generalized myalgias.  Denies any recent headache, neck stiffness, rhinitis, rhinorrhea, sore throat, shortness of breath, wheezing, cough, abdominal pain, diarrhea, or rash.  No recent traveling or known COVID-19 exposures.  She also denies any recent dysuria, gross hematuria, or change in urinary urgency/frequency.  No recent chest pain, diaphoresis, or palpitations.  She also denies any recent worsening of peripheral edema, orthopnea, or PND.  She also denies any acute focal weakness, acute focal numbness, paresthesias, dysphagia, slurred speech, facial droop, acute change in vision, or vertigo.  In the setting of her nausea resulting  in multiple episodes of vomiting over the course the last day, the patient conveys that she has been unable to tolerate much p.o. over that timeframe, including very limited ability to tolerate any p.o. consumption of water.  Denies any history of alcohol abuse or any recent consumption of alcohol, also denying any history of recreational drug use.  Per chart review, most recent hemoglobin A1c noted to be 7.7% on 10/24/2020.  Medical history notable for a documented history of chronic diastolic heart failure with most recent echocardiogram on 10/25/2020 showing LVEF 50 to 55%, no focal wall motion abnormalities, mild LVH, grade 1 diastolic dysfunction, mildly dilated left atrium and trivial mitral regurgitation.  Additionally, per chart review, patient's chronic heart failure is felt to be nonischemic in nature, with left-sided heart cath in February 2016 reportedly revealing evidence of nonobstructive CAD.     ED Course:  Vital signs in the ED were notable for the following: Temperature max 98.0, initial heart rate 108, which is improved to 83-87 following interval administration of IV fluids, as further detailed below, blood pressure 109/78 -115/77; respiratory rate 14-17, oxygen saturation 97 to 100% on room air.  Labs were notable for the following: CMP notable for the following: Sodium 128, which corrects to approximately 134 when taking into account concomitant hyperglycemia, potassium 4.7, bicarbonate 12, anion gap 20, BUN 38, creatinine 2.0 which is relative to baseline creatinine range of 1.1-1.3, with most recent prior value of 1.33 on 11/30/2020, and liver enzymes were found to be within normal limits.  Point-of-care glucose 443.  Beta hydroxybutyric acid greater than 8.0.  VBG has been ordered, with result currently pending.  Additionally, urinalysis has  been ordered, with result currently pending.  CBC notable for white blood cell count 5800.  Screening nasopharyngeal COVID-19/influenza PCR  were checked in the ED today, with results currently pending.  While in the ED, the following were administered: Insulin drip, lactated Ringer bolus x900 cc (consistent with 20 mL/kg volume), followed by initiation of continuous lactated Ringer's at 125 cc/h.  Subsequently, the patient was admitted to the stepdown unit for further evaluation and management of suspected presenting DKA.    Review of Systems: As per HPI otherwise 10 point review of systems negative.   Past Medical History:  Diagnosis Date   Abnormal LFTs    a. 05/2014 -  ALT 52, alk phos 130.   Chronic systolic CHF (congestive heart failure) (HCC)    a. Dx 05/2014 - echo at [facility] - EF 30-35%, moderate LVH, no rWMA, mild to moderate MR, moderate PH with PA pressure 60-3mmHg, mild to moderate pericardial effusion. EF 30-35% by cath.   History of blood transfusion 1986   "related to C-section"   Hypertension    Insulin dependent diabetes mellitus    Iron deficiency anemia    NICM (nonischemic cardiomyopathy) (HCC)    a. LHC 06/11/14:  minor nonobstructive CAD with mild irregularities in the LAD less than 10-20%, LCx with mild disease less than 20%, no significant disease in the RCA, EF 30-35%.   Pericardial effusion    a. Mild-moderate pericardial effusion by echo at [facility].   Pulmonary hypertension (HCC)    a. Moderate PH by echo at [facility].   Tobacco abuse     Past Surgical History:  Procedure Laterality Date   APPENDECTOMY  ~ 2003   CESAREAN SECTION  1986   LEFT HEART CATHETERIZATION WITH CORONARY ANGIOGRAM N/A 06/11/2014   Procedure: LEFT HEART CATHETERIZATION WITH CORONARY ANGIOGRAM;  Surgeon: Peter M Swaziland, MD;  Location: [facility] CATH LAB;  Service: Cardiovascular;  Laterality: N/A;    Social History:  reports that she quit smoking about 5 years ago. Her smoking use included cigarettes. She has a 11.00 pack-year smoking history. She has never used smokeless tobacco. She reports that she does not drink alcohol and does  not use drugs.   No Known Allergies  Family History  Problem Relation Age of Onset   Coronary artery disease Mother    Diabetes Mother    Lupus Mother    Stroke Mother    Lung cancer Father     Family history reviewed and not pertinent    Prior to Admission medications   Medication Sig Start Date End Date Taking? Authorizing Provider  albuterol (VENTOLIN HFA) 108 (90 Base) MCG/ACT inhaler Inhale 2 puffs into the lungs every 6 (six) hours as needed for wheezing or shortness of breath. 10/28/20   Drema Dallas, MD  aspirin 81 MG EC tablet TAKE 1 TABLET (81 MG TOTAL) BY MOUTH DAILY. 09/10/14   [place], Peter M, MD  atorvastatin (LIPITOR) 40 MG tablet Take 1 tablet (40 mg total) by mouth every evening. 03/26/20   Evlyn Kanner, MD  clopidogrel (PLAVIX) 75 MG tablet Take 1 tablet (75 mg total) by mouth daily. 10/28/20 10/28/21  Drema Dallas, MD  dronabinol (MARINOL) 5 MG capsule Take 1 capsule (5 mg total) by mouth 2 (two) times daily before lunch and supper. 10/28/20   Drema Dallas, MD  feeding supplement (ENSURE ENLIVE / ENSURE PLUS) LIQD Take 237 mLs by mouth 2 (two) times daily between meals. 11/06/20   Kathlen Mody, MD  feeding supplement (ENSURE ENLIVE / ENSURE PLUS) LIQD Take 237 mLs by mouth 3 (three) times daily between meals. 11/18/20   Nicole Kindred A, DO  insulin aspart (NOVOLOG FLEXPEN) 100 UNIT/ML FlexPen Inject 5 Units into the skin 3 (three) times daily with meals. 12/03/20   Antonieta Pert, MD  insulin glargine (LANTUS) 100 UNIT/ML Solostar Pen Inject 7 Units into the skin 2 (two) times daily. 12/03/20   Antonieta Pert, MD  Insulin Pen Needle (PENTIPS) 32G X 4 MM MISC Use as directed 11/28/20   Kayleen Memos, DO  ivabradine (CORLANOR) 5 MG TABS tablet Take 0.5 tablets (2.5 mg total) by mouth 2 (two) times daily with a meal. 10/16/20   Lyda Jester M, PA-C  mirtazapine (REMERON SOL-TAB) 15 MG disintegrating tablet Take 0.5 tablets (7.5 mg total) by mouth at bedtime. 03/26/20    Sanjuan Dame, MD  Multiple Vitamin (MULTIVITAMIN WITH MINERALS) TABS tablet Take 1 tablet by mouth daily. 11/07/20   Hosie Poisson, MD  sodium zirconium cyclosilicate (LOKELMA) 10 g PACK packet Take 10 g by mouth daily. 12/02/20 01/01/21  Antonieta Pert, MD  insulin aspart protamine - aspart (NOVOLOG 70/30 MIX) (70-30) 100 UNIT/ML FlexPen Inject 0.04 mLs (4 Units total) into the skin 2 (two) times daily with a meal. 11/28/20 12/01/20  Kayleen Memos, DO     Objective    Physical Exam: Vitals:   12/25/20 1702 12/25/20 1815 12/25/20 1830 12/25/20 1845  BP: 103/73 113/74 115/72 109/78  Pulse: 93 87 83 88  Resp: $Remo'17 12 13 14  'NvGDV$ Temp:      SpO2: 97% 100% 100% 99%  Weight:   46.3 kg     General: appears to be stated age; alert, oriented Skin: warm, dry, no rash Head:  AT/[place] Mouth:  Oral mucosa membranes appear dry, normal dentition Neck: supple; trachea midline Heart:  RRR; did not appreciate any M/R/G Lungs: CTAB, did not appreciate any wheezes, rales, or rhonchi Abdomen: + BS; soft, ND, NT Vascular: 2+ pedal pulses b/l; 2+ radial pulses b/l Extremities: no peripheral edema, no muscle wasting Neuro: strength and sensation intact in upper and lower extremities b/l    Labs on Admission: I have personally reviewed following labs and imaging studies  CBC: Recent Labs  Lab 12/25/20 1441  WBC 5.8  NEUTROABS 4.0  HGB 13.0  HCT 41.3  MCV 99.3  PLT 568   Basic Metabolic Panel: Recent Labs  Lab 12/25/20 1441  NA 128*  K 4.7  CL 96*  CO2 12*  GLUCOSE 466*  BUN 38*  CREATININE 2.00*  CALCIUM 9.8   GFR: Estimated Creatinine Clearance: 22.4 mL/min (A) (by C-G formula based on SCr of 2 mg/dL (H)). Liver Function Tests: Recent Labs  Lab 12/25/20 1441  AST 17  ALT 11  ALKPHOS 54  BILITOT 2.1*  PROT 7.2  ALBUMIN 3.8   No results for input(s): LIPASE, AMYLASE in the last 168 hours. No results for input(s): AMMONIA in the last 168 hours. Coagulation Profile: No results  for input(s): INR, PROTIME in the last 168 hours. Cardiac Enzymes: No results for input(s): CKTOTAL, CKMB, CKMBINDEX, TROPONINI in the last 168 hours. BNP (last 3 results) No results for input(s): PROBNP in the last 8760 hours. HbA1C: No results for input(s): HGBA1C in the last 72 hours. CBG: Recent Labs  Lab 12/25/20 1403 12/25/20 1839  GLUCAP 443* 379*   Lipid Profile: No results for input(s): CHOL, HDL, LDLCALC, TRIG, CHOLHDL, LDLDIRECT in the last 72 hours. Thyroid  Function Tests: No results for input(s): TSH, T4TOTAL, FREET4, T3FREE, THYROIDAB in the last 72 hours. Anemia Panel: No results for input(s): VITAMINB12, FOLATE, FERRITIN, TIBC, IRON, RETICCTPCT in the last 72 hours. Urine analysis:    Component Value Date/Time   COLORURINE YELLOW 11/25/2020 0033   APPEARANCEUR CLOUDY (A) 11/25/2020 0033   LABSPEC 1.011 11/25/2020 0033   PHURINE 8.0 11/25/2020 0033   GLUCOSEU 150 (A) 11/25/2020 0033   HGBUR NEGATIVE 11/25/2020 0033   BILIRUBINUR NEGATIVE 11/25/2020 0033   KETONESUR NEGATIVE 11/25/2020 0033   PROTEINUR NEGATIVE 11/25/2020 0033   NITRITE POSITIVE (A) 11/25/2020 0033   LEUKOCYTESUR NEGATIVE 11/25/2020 0033    Radiological Exams on Admission: No results found.    Assessment/Plan   Christine Benitez is a 60 y.o. female with medical history significant for type 1 diabetes mellitus, chronic diastolic heart failure in the setting of nonischemic cardiomyopathy, who is admitted to [facility] on 12/25/2020 with suspected diabetic ketoacidosis after presenting from home to [facility] ED complaining of nausea vomiting.    Principal Problem:   DKA (diabetic ketoacidosis) ([place]) Active Problems:   AKI (acute kidney injury) ([place])   Nausea & vomiting   Dehydration   Chronic diastolic CHF (congestive heart failure) ([place])     #) Diabetic ketoacidosis: In the setting of a known history of DM, that appears to be fairly well controlled as outpatient with most recent A1c  of 7.7% on 11/23/20 , on home basal and short acting insulin insulin i as further detailed above, suspected dx of dka on the basis of presenting blood sugar of 443 associated with a presenting anion gap metabolic acidosis with beta hydroxybutyric acid greater than 8.  Of note, VBG has been ordered, with result currently pending. Additional presenting labs notable for corrected serum sodium of 134 and serum potassium of 4.7.  Etiology leading to suspected presenting DKA is currently unclear, as the patient reports good compliance with her home basal and short acting insulin, which appears to be consistent with her recent hemoglobin A1c value as quantified above.  No evidence at this time to suggest underlying precipitating infectious etiology, although will further evaluate for any evidence of such by checking urinalysis as well as chest x-ray.  Of note, screening COVID-19 PCR result is also currently pending. Will also check EKG, although the patient denies any recent associated chest pain.  Overall, no obvious precipitating factors at this time leading to today's presentation of DKA.   In the emergency department patient received 900 cc LR bolus followed by transition to continuous LR, as above. As serum potassium level is currently less than 5, will transition IV fluids at this time to half-normal saline with 10 mEq/L KCl at 125 cc/hr.    Plan: DKA protocol initiated. Insulin drip per protocol. Q1 hour Accuchecks. Q4H BMP's in order to monitor ensuing anion gap, serum sodium, serum potassium have been ordered through 9 AM on 12/26/2020 . NPO. Once Q1 hour accuchecks reflect that serum glucose is less than 250, will add D5 to the existing IVF's. Check Q4 hour serum magnesium and phosphorus levels, with prn supplementation per protocol. Once anion gap has closed and patient tolerating PO, will reinitiate basal insulin while continuing insulin drip for an additional two hours to prevent rebound hyperglycemia,  before ultimately turning off the insulin drip. Until then, holding home insulin. Monitor on telemetry. Monitor strict I's and O's.  Will refrain from repeat a1c given the most recent prior occurred within the last 90  days. Prn IV Zofran. Will also obtain CXR and EKG at this time, for completeness of DKA work-up. Check UDS.  Follow for result of UA as well as COVID-19 PCR. Follow for results of VBG.       #) Acute kidney injury: Presenting serum creatinine noted to be 2.0, which represents increased from baseline range of 1.1-1.3, with most recent prior serum creatinine data point noted to be 1.33 on 11/30/2020.  This appears to be prerenal in nature in the setting of intravascular depletion as result of dehydration in the setting of presenting DKA as well as associated increase in GI losses and diminished oral intake over the last day, as further outlined above.  Urinalysis with microscopy has been ordered, with result currently pending.  Plan: Follow for results of urinalysis with microscopy.  Add on random urine sodium as well as random urine creatinine.  Monitor strict I's and O's and daily weights.  Further evaluation management of presenting DKA, including plan regarding continuous IV fluids, as above.      #) Dehydration: Physical exam evidence of dry oral mucosa membranes, with history consistent with dehydration in the context of 1 day of increased GI losses with associated decline in oral intake over that time.  No evidence of associated hypotension.  Plan: Further evaluation management of presenting DKA, as above.  Continuous IV fluids, as further detailed above.  Monitor strict I's and O's and daily weights.  Add on serum magnesium level.  Repeat BMP in the morning.       #) Chronic diastolic heart failure: Documented history of such, with most recent echocardiogram performed in June 2022 demonstrating LVEF 50 to 53%, grade 1 diastolic dysfunction, no evidence of focal wall motion  abnormalities, as further detailed above.  Not on any scheduled diuretic medications at home.  No clinical evidence to suggest acutely decompensated heart failure.  ACS is also felt to be less likely, particularly in the absence of any recent chest pain.  However, will check EKG and chest x-ray in the context of presenting DKA with otherwise unclear precipitating etiology.  Plan: Monitor strict I's and O's and daily weights.  Add on serum magnesium level.  Check chest x-ray and EKG as component of evaluation for presenting DKA, as above.  Repeat BMP in the morning.      DVT prophylaxis: SCDs Code Status: DNR Family Communication: none Disposition Plan: Per Rounding Team Consults called: none  Admission status: Observation; stepdown     Of note, this patient was added by me to the following Admit List/Treatment Team: [email or web address].      PLEASE NOTE THAT DRAGON DICTATION SOFTWARE WAS USED IN THE CONSTRUCTION OF THIS NOTE.   [place] [facility] Pager [phone] From [place]  Otherwise, please contact night-coverage  [email or web address] Password [facility]   12/25/2020, 7:31 PM

## 2020-12-26 ENCOUNTER — Observation Stay (HOSPITAL_COMMUNITY): Payer: Medicaid Other

## 2020-12-26 DIAGNOSIS — I428 Other cardiomyopathies: Secondary | ICD-10-CM | POA: Diagnosis not present

## 2020-12-26 DIAGNOSIS — I5032 Chronic diastolic (congestive) heart failure: Secondary | ICD-10-CM | POA: Diagnosis not present

## 2020-12-26 DIAGNOSIS — E860 Dehydration: Secondary | ICD-10-CM | POA: Diagnosis present

## 2020-12-26 DIAGNOSIS — Z8249 Family history of ischemic heart disease and other diseases of the circulatory system: Secondary | ICD-10-CM | POA: Diagnosis not present

## 2020-12-26 DIAGNOSIS — E1010 Type 1 diabetes mellitus with ketoacidosis without coma: Secondary | ICD-10-CM | POA: Diagnosis not present

## 2020-12-26 DIAGNOSIS — N179 Acute kidney failure, unspecified: Secondary | ICD-10-CM | POA: Diagnosis not present

## 2020-12-26 DIAGNOSIS — Z20822 Contact with and (suspected) exposure to covid-19: Secondary | ICD-10-CM | POA: Diagnosis not present

## 2020-12-26 DIAGNOSIS — Z823 Family history of stroke: Secondary | ICD-10-CM | POA: Diagnosis not present

## 2020-12-26 DIAGNOSIS — Z66 Do not resuscitate: Secondary | ICD-10-CM | POA: Diagnosis not present

## 2020-12-26 DIAGNOSIS — R112 Nausea with vomiting, unspecified: Secondary | ICD-10-CM | POA: Diagnosis present

## 2020-12-26 DIAGNOSIS — I110 Hypertensive heart disease with heart failure: Secondary | ICD-10-CM | POA: Diagnosis not present

## 2020-12-26 DIAGNOSIS — E1110 Type 2 diabetes mellitus with ketoacidosis without coma: Secondary | ICD-10-CM

## 2020-12-26 DIAGNOSIS — Z794 Long term (current) use of insulin: Secondary | ICD-10-CM | POA: Diagnosis not present

## 2020-12-26 DIAGNOSIS — Z801 Family history of malignant neoplasm of trachea, bronchus and lung: Secondary | ICD-10-CM | POA: Diagnosis not present

## 2020-12-26 DIAGNOSIS — Z832 Family history of diseases of the blood and blood-forming organs and certain disorders involving the immune mechanism: Secondary | ICD-10-CM | POA: Diagnosis not present

## 2020-12-26 DIAGNOSIS — Z833 Family history of diabetes mellitus: Secondary | ICD-10-CM | POA: Diagnosis not present

## 2020-12-26 DIAGNOSIS — Z87891 Personal history of nicotine dependence: Secondary | ICD-10-CM | POA: Diagnosis not present

## 2020-12-26 LAB — CBC
HCT: 32.7 % — ABNORMAL LOW (ref 36.0–46.0)
Hemoglobin: 11 g/dL — ABNORMAL LOW (ref 12.0–15.0)
MCH: 31.2 pg (ref 26.0–34.0)
MCHC: 33.6 g/dL (ref 30.0–36.0)
MCV: 92.6 fL (ref 80.0–100.0)
Platelets: 180 10*3/uL (ref 150–400)
RBC: 3.53 MIL/uL — ABNORMAL LOW (ref 3.87–5.11)
RDW: 12.6 % (ref 11.5–15.5)
WBC: 4.1 10*3/uL (ref 4.0–10.5)
nRBC: 0 % (ref 0.0–0.2)

## 2020-12-26 LAB — COMPREHENSIVE METABOLIC PANEL
ALT: 8 U/L (ref 0–44)
AST: 15 U/L (ref 15–41)
Albumin: 3 g/dL — ABNORMAL LOW (ref 3.5–5.0)
Alkaline Phosphatase: 44 U/L (ref 38–126)
Anion gap: 8 (ref 5–15)
BUN: 21 mg/dL — ABNORMAL HIGH (ref 6–20)
CO2: 22 mmol/L (ref 22–32)
Calcium: 9.3 mg/dL (ref 8.9–10.3)
Chloride: 103 mmol/L (ref 98–111)
Creatinine, Ser: 1.15 mg/dL — ABNORMAL HIGH (ref 0.44–1.00)
GFR, Estimated: 55 mL/min — ABNORMAL LOW (ref >60)
Glucose, Bld: 203 mg/dL — ABNORMAL HIGH (ref 70–99)
Potassium: 3.8 mmol/L (ref 3.5–5.1)
Sodium: 133 mmol/L — ABNORMAL LOW (ref 135–145)
Total Bilirubin: 0.5 mg/dL (ref 0.3–1.2)
Total Protein: 5.9 g/dL — ABNORMAL LOW (ref 6.5–8.1)

## 2020-12-26 LAB — MAGNESIUM: Magnesium: 1.8 mg/dL (ref 1.7–2.4)

## 2020-12-26 LAB — GLUCOSE, CAPILLARY
Glucose-Capillary: 138 mg/dL — ABNORMAL HIGH (ref 70–99)
Glucose-Capillary: 177 mg/dL — ABNORMAL HIGH (ref 70–99)
Glucose-Capillary: 184 mg/dL — ABNORMAL HIGH (ref 70–99)
Glucose-Capillary: 188 mg/dL — ABNORMAL HIGH (ref 70–99)
Glucose-Capillary: 191 mg/dL — ABNORMAL HIGH (ref 70–99)
Glucose-Capillary: 192 mg/dL — ABNORMAL HIGH (ref 70–99)
Glucose-Capillary: 193 mg/dL — ABNORMAL HIGH (ref 70–99)
Glucose-Capillary: 198 mg/dL — ABNORMAL HIGH (ref 70–99)
Glucose-Capillary: 206 mg/dL — ABNORMAL HIGH (ref 70–99)
Glucose-Capillary: 218 mg/dL — ABNORMAL HIGH (ref 70–99)
Glucose-Capillary: 224 mg/dL — ABNORMAL HIGH (ref 70–99)
Glucose-Capillary: 231 mg/dL — ABNORMAL HIGH (ref 70–99)
Glucose-Capillary: 235 mg/dL — ABNORMAL HIGH (ref 70–99)

## 2020-12-26 LAB — BASIC METABOLIC PANEL
Anion gap: 7 (ref 5–15)
BUN: 18 mg/dL (ref 6–20)
CO2: 24 mmol/L (ref 22–32)
Calcium: 9.4 mg/dL (ref 8.9–10.3)
Chloride: 101 mmol/L (ref 98–111)
Creatinine, Ser: 1.13 mg/dL — ABNORMAL HIGH (ref 0.44–1.00)
GFR, Estimated: 56 mL/min — ABNORMAL LOW (ref >60)
Glucose, Bld: 215 mg/dL — ABNORMAL HIGH (ref 70–99)
Potassium: 3.9 mmol/L (ref 3.5–5.1)
Sodium: 132 mmol/L — ABNORMAL LOW (ref 135–145)

## 2020-12-26 LAB — HEMOGLOBIN A1C
Hgb A1c MFr Bld: 10.1 % — ABNORMAL HIGH (ref 4.8–5.6)
Mean Plasma Glucose: 243.17 mg/dL

## 2020-12-26 LAB — MRSA NEXT GEN BY PCR, NASAL: MRSA by PCR Next Gen: NOT DETECTED

## 2020-12-26 LAB — PHOSPHORUS: Phosphorus: 2.1 mg/dL — ABNORMAL LOW (ref 2.5–4.6)

## 2020-12-26 LAB — SARS CORONAVIRUS 2 (TAT 6-24 HRS): SARS Coronavirus 2: NEGATIVE

## 2020-12-26 IMAGING — DX DG CHEST 1V PORT
1 series · 1 of 1 positions shown · non-contrast
Comparison: Chest x-ray dated [DATE].

CLINICAL DATA: DKA.

EXAM:
PORTABLE CHEST 1 VIEW

[chest]
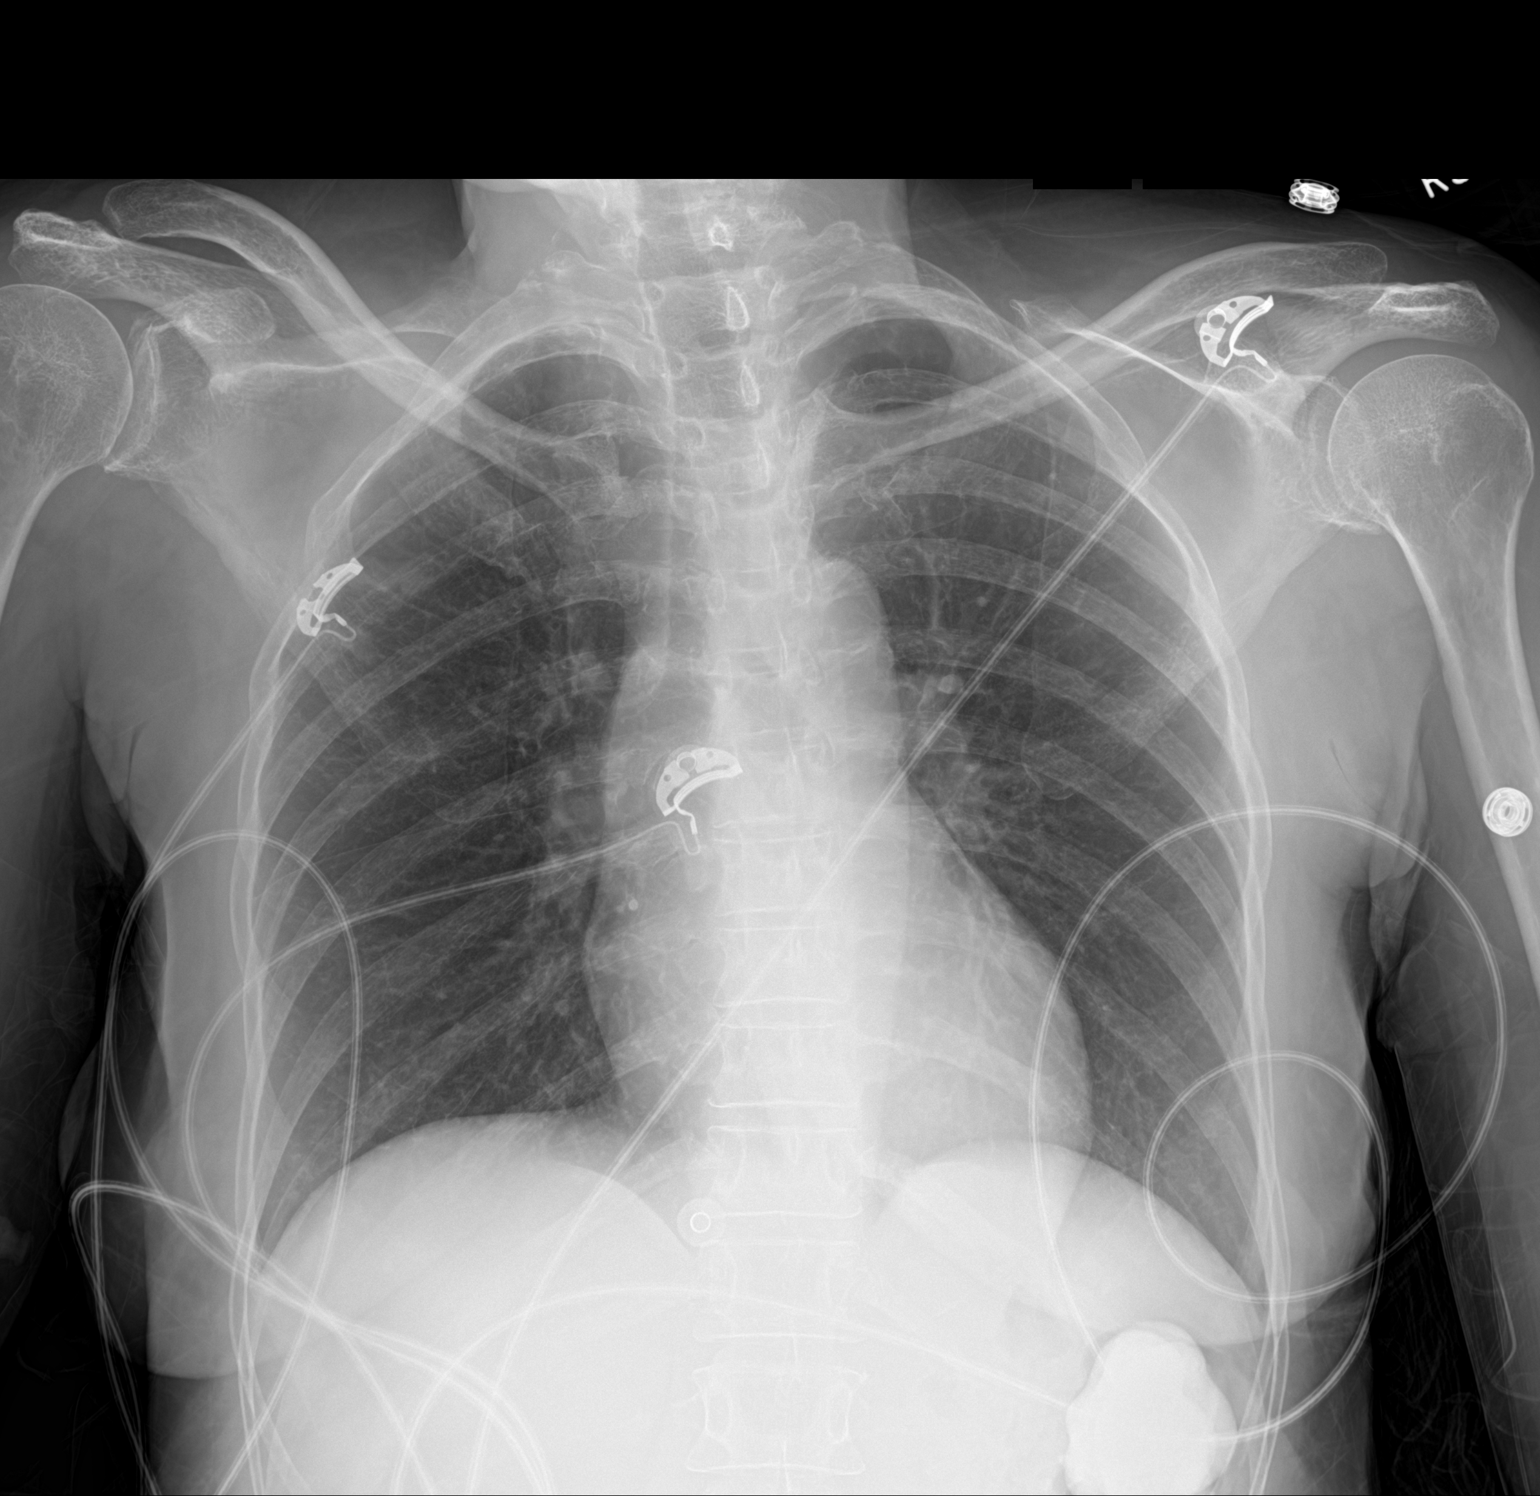

[1 of 1 positions shown; findings below may reference images not displayed]

FINDINGS: The heart size and mediastinal contours are within normal limits.
Both lungs are clear. The visualized skeletal structures are
unremarkable.
IMPRESSION: No active disease.

## 2020-12-26 MED ORDER — INSULIN DETEMIR 100 UNIT/ML ~~LOC~~ SOLN
7.0000 [IU] | Freq: Two times a day (BID) | SUBCUTANEOUS | Status: DC
Start: 2020-12-26 — End: 2020-12-27
  Administered 2020-12-26 – 2020-12-27 (×3): 7 [IU] via SUBCUTANEOUS
  Filled 2020-12-26 (×4): qty 0.07

## 2020-12-26 MED ORDER — INSULIN DETEMIR 100 UNIT/ML ~~LOC~~ SOLN: 7.0000 [IU] | Freq: Every day | SUBCUTANEOUS | Status: DC

## 2020-12-26 MED ORDER — DM-GUAIFENESIN ER 30-600 MG PO TB12: 1.0000 | ORAL_TABLET | Freq: Two times a day (BID) | ORAL | Status: DC | PRN

## 2020-12-26 MED ORDER — ATORVASTATIN CALCIUM 40 MG PO TABS
40.0000 mg | ORAL_TABLET | Freq: Every evening | ORAL | Status: DC
  Administered 2020-12-26: 40 mg via ORAL
  Filled 2020-12-26: qty 1

## 2020-12-26 MED ORDER — INSULIN ASPART 100 UNIT/ML IJ SOLN
0.0000 [IU] | Freq: Three times a day (TID) | INTRAMUSCULAR | Status: DC
  Administered 2020-12-26 – 2020-12-27 (×3): 5 [IU] via SUBCUTANEOUS
  Administered 2020-12-27: 11 [IU] via SUBCUTANEOUS

## 2020-12-26 MED ORDER — INSULIN ASPART 100 UNIT/ML IJ SOLN
2.0000 [IU] | Freq: Three times a day (TID) | INTRAMUSCULAR | Status: DC
  Administered 2020-12-26 – 2020-12-27 (×3): 2 [IU] via SUBCUTANEOUS

## 2020-12-26 MED ORDER — MIRTAZAPINE 15 MG PO TBDP
7.5000 mg | ORAL_TABLET | Freq: Every day | ORAL | Status: DC
  Administered 2020-12-26: 7.5 mg via ORAL
  Filled 2020-12-26 (×2): qty 0.5

## 2020-12-26 MED ORDER — INSULIN ASPART 100 UNIT/ML IJ SOLN
0.0000 [IU] | Freq: Every day | INTRAMUSCULAR | Status: DC
  Administered 2020-12-26: 2 [IU] via SUBCUTANEOUS

## 2020-12-26 NOTE — Progress Notes (Signed)
PROGRESS NOTE    Christine Benitez  TDD:[id number] DOB: February 03, 1961 DOA: 12/25/2020 PCP: Lupita Dawn, MD   Brief Narrative:  60-year-old with DM1, diastolic CHF, nonischemic CM admitted for nausea vomiting found to be in DKA started on DKA protocol.  Upon admission patient also had AKI with creatinine greater than 2.0, baseline creatinine 1.3.   Assessment & Plan:   Principal Problem:   DKA (diabetic ketoacidosis) (HCC) Active Problems:   AKI (acute kidney injury) (HCC)   Nausea & vomiting   Dehydration   Chronic diastolic CHF (congestive heart failure) (HCC)   Diabetic ketoacidosis, resolved Diabetes mellitus type 2, insulin-dependent - DKA has now resolved.  Will transition to subcutaneous insulin and home oral regimen.  Start oral diet as tolerated.  Antiemetics as needed. -A1c 10.1  Acute kidney injury; resolved - Baseline creatinine 1.2.  Admission creatinine 2.0 improved with IV fluids.   Congestive heart failure with preserved ejection fraction, EF 55% - Supportive care.  Resume home medications    DVT prophylaxis: SCDs Start: 12/25/20 1924 Code Status: DNR Family Communication:  daughter updated.     Dispo: The patient is from: Home              Anticipated d/c is to: Home              Patient currently is not medically stable to d/c.   Difficult to place patient No  Subjective: Hard of hearing but no complaints. Ate her breakfast ok.   Review of Systems Otherwise negative except as per HPI, including: General: Denies fever, chills, night sweats or unintended weight loss. Resp: Denies cough, wheezing, shortness of breath. Cardiac: Denies chest pain, palpitations, orthopnea, paroxysmal nocturnal dyspnea. GI: Denies abdominal pain, nausea, vomiting, diarrhea or constipation GU: Denies dysuria, frequency, hesitancy or incontinence MS: Denies muscle aches, joint pain or swelling Neuro: Denies headache, neurologic deficits (focal weakness, numbness, tingling),  abnormal gait Psych: Denies anxiety, depression, SI/HI/AVH Skin: Denies new rashes or lesions ID: Denies sick contacts, exotic exposures, travel  Examination:  General exam: Appears calm and comfortable. Hard of hearing.  Respiratory system: Clear to auscultation. Respiratory effort normal. Cardiovascular system: S1 & S2 heard, RRR. No JVD, murmurs, rubs, gallops or clicks. No pedal edema. Gastrointestinal system: Abdomen is nondistended, soft and nontender. No organomegaly or masses felt. Normal bowel sounds heard. Central nervous system: Alert and oriented. No focal neurological deficits. Extremities: Symmetric 5 x 5 power. Skin: No rashes, lesions or ulcers Psychiatry: Judgement and insight appear normal. Mood & affect appropriate.     Objective: Vitals:   12/26/20 0300 12/26/20 0336 12/26/20 0536 12/26/20 0721  BP: 115/70 112/73  113/72  Pulse: 87 82  81  Resp: 19 14  16   Temp:  98.7 F (37.1 C)  97.9 F (36.6 C)  TempSrc:  Oral    SpO2: 96% 98%  97%  Weight:   42.6 kg     Intake/Output Summary (Last 24 hours) at 12/26/2020 0811 Last data filed at 12/26/2020 0400 Gross per 24 hour  Intake 514.85 ml  Output 150 ml  Net 364.85 ml   Filed Weights   12/25/20 1830 12/25/20 2332 12/26/20 0536  Weight: 46.3 kg 40.6 kg 42.6 kg     Data Reviewed:   CBC: Recent Labs  Lab 12/25/20 1441 12/25/20 2057 12/26/20 0604  WBC 5.8  --  4.1  NEUTROABS 4.0  --   --   HGB 13.0 12.6 11.0*  HCT 41.3 37.0 32.7*  MCV  99.3  --  92.6  PLT 209  --  180   Basic Metabolic Panel: Recent Labs  Lab 12/25/20 1441 12/25/20 2040 12/25/20 2057 12/25/20 2225 12/26/20 0604  NA 128*  --  132* 132* 133*  K 4.7  --  4.8 4.5 3.8  CL 96*  --   --  99 103  CO2 12*  --   --  16* 22  GLUCOSE 466*  --   --  311* 203*  BUN 38*  --   --  31* 21*  CREATININE 2.00*  --   --  1.56* 1.15*  CALCIUM 9.8  --   --  9.6 9.3  MG  --  2.2  --   --  1.8  PHOS  --   --   --   --  2.1*    GFR: Estimated Creatinine Clearance: 35.9 mL/min (A) (by C-G formula based on SCr of 1.15 mg/dL (H)). Liver Function Tests: Recent Labs  Lab 12/25/20 1441 12/26/20 0604  AST 17 15  ALT 11 8  ALKPHOS 54 44  BILITOT 2.1* 0.5  PROT 7.2 5.9*  ALBUMIN 3.8 3.0*   No results for input(s): LIPASE, AMYLASE in the last 168 hours. No results for input(s): AMMONIA in the last 168 hours. Coagulation Profile: No results for input(s): INR, PROTIME in the last 168 hours. Cardiac Enzymes: No results for input(s): CKTOTAL, CKMB, CKMBINDEX, TROPONINI in the last 168 hours. BNP (last 3 results) No results for input(s): PROBNP in the last 8760 hours. HbA1C: No results for input(s): HGBA1C in the last 72 hours. CBG: Recent Labs  Lab 12/26/20 0339 12/26/20 0453 12/26/20 0540 12/26/20 0641 12/26/20 0755  GLUCAP 193* 198* 184* 188* 231*   Lipid Profile: No results for input(s): CHOL, HDL, LDLCALC, TRIG, CHOLHDL, LDLDIRECT in the last 72 hours. Thyroid Function Tests: No results for input(s): TSH, T4TOTAL, FREET4, T3FREE, THYROIDAB in the last 72 hours. Anemia Panel: No results for input(s): VITAMINB12, FOLATE, FERRITIN, TIBC, IRON, RETICCTPCT in the last 72 hours. Sepsis Labs: No results for input(s): PROCALCITON, LATICACIDVEN in the last 168 hours.  Recent Results (from the past 240 hour(s))  SARS CORONAVIRUS 2 (TAT 6-24 HRS) Nasopharyngeal Nasopharyngeal Swab     Status: None   Collection Time: 12/25/20  6:33 PM   Specimen: Nasopharyngeal Swab  Result Value Ref Range Status   SARS Coronavirus 2 NEGATIVE NEGATIVE Final    Comment: (NOTE) SARS-CoV-2 target nucleic acids are NOT DETECTED.  The SARS-CoV-2 RNA is generally detectable in upper and lower respiratory specimens during the acute phase of infection. Negative results do not preclude SARS-CoV-2 infection, do not rule out co-infections with other pathogens, and should not be used as the sole basis for treatment or other  patient management decisions. Negative results must be combined with clinical observations, patient history, and epidemiological information. The expected result is Negative.  Fact Sheet for Patients: HairSlick.no  Fact Sheet for Healthcare Providers: quierodirigir.com  This test is not yet approved or cleared by the [place] FDA and  has been authorized for detection and/or diagnosis of SARS-CoV-2 by FDA under an Emergency Use Authorization (EUA). This EUA will remain  in effect (meaning this test can be used) for the duration of the COVID-19 declaration under Se ction 564(b)(1) of the Act, 21 U.S.C. section 360bbb-3(b)(1), unless the authorization is terminated or revoked sooner.  Performed at [facility], [address] [place], [place] [address]   MRSA Next Gen by PCR,  Nasal     Status: None   Collection Time: 12/26/20  1:59 AM   Specimen: Nasal Mucosa; Nasal Swab  Result Value Ref Range Status   MRSA by PCR Next Gen NOT DETECTED NOT DETECTED Final    Comment: (NOTE) The GeneXpert MRSA Assay (FDA approved for NASAL specimens only), is one component of a comprehensive MRSA colonization surveillance program. It is not intended to diagnose MRSA infection nor to guide or monitor treatment for MRSA infections. Test performance is not FDA approved in patients less than 23 years old. Performed at [facility], [address], [place], [place] [address]          Radiology Studies: DG CHEST PORT 1 VIEW  Result Date: 12/26/2020 CLINICAL DATA:  DKA. EXAM: PORTABLE CHEST 1 VIEW COMPARISON:  Chest x-ray dated November 24, 2020. FINDINGS: The heart size and mediastinal contours are within normal limits. Both lungs are clear. The visualized skeletal structures are unremarkable. IMPRESSION: No active disease. Electronically Signed   By: Obie Dredge M.D.   On: 12/26/2020 06:50        Scheduled Meds:   aspirin EC  81 mg Oral Daily   clopidogrel  75 mg Oral Daily   ivabradine  2.5 mg Oral BID WC   Continuous Infusions:  dextrose 5 % and 0.45 % NaCl with KCl 10 mEq/L 125 mL/hr at 12/26/20 0014   insulin 1.3 Units/hr (12/26/20 0644)   sodium chloride 0.45 % with kcl Stopped (12/26/20 0012)     LOS: 0 days   Time spent= 35 mins    Brittne Kawasaki Joline Maxcy, MD [facility]  If 7PM-7AM, please contact night-coverage  12/26/2020, 8:11 AM

## 2020-12-26 NOTE — Plan of Care (Signed)

## 2020-12-26 NOTE — Progress Notes (Signed)
Inpatient Diabetes Program Recommendations  AACE/ADA: New Consensus Statement on Inpatient Glycemic Control   Target Ranges:  Prepandial:   less than 140 mg/dL      Peak postprandial:   less than 180 mg/dL (1-2 hours)      Critically ill patients:  140 - 180 mg/dL   Results for Christine Benitez, Christine Benitez (MRN [id number]) as of 12/26/2020 10:24  Ref. Range 12/26/2020 00:43 12/26/2020 01:52 12/26/2020 02:41 12/26/2020 03:39 12/26/2020 04:53 12/26/2020 05:40 12/26/2020 06:41 12/26/2020 07:55  Glucose-Capillary Latest Ref Range: 70 - 99 mg/dL 010 (H) 272 (H) 536 (H) 193 (H) 198 (H) 184 (H) 188 (H) 231 (H)   Results for Christine Benitez, Christine Benitez (MRN [id number]) as of 12/26/2020 10:24  Ref. Range 12/25/2020 14:41  Beta-Hydroxybutyric Acid Latest Ref Range: 0.05 - 0.27 mmol/L >8.00 (H)  Glucose Latest Ref Range: 70 - 99 mg/dL 595 (H)   Review of Glycemic Control  Diabetes history: DM2 Outpatient Diabetes medications: Lantus 7 units BID, Novolog 5 units TID with meals Current orders for Inpatient glycemic control: Levemir 7 units BID, Novolog 2 units TID with meals, Novolog 0-15 units TID with meals, Novolog 0-5 units QHS  Inpatient Diabetes Program Recommendations:    Insulin: Noted patient was given Levemir 7 units today at 9:15 am. Please consider decreasing Levemir to 5 units BID (to start at bedtime today) and Novolog correction to 0-6 units TID with meals.  NOTE: Noted consult for diabetes coordinator. Inpatient diabetes coordinator spoke with patient, her daughter, and her mother on 11/26/20 during prior hospital admission. Patient admitted with DKA on 12/25/20 with initial lab glucose of 466 mg/dl. Patient was ordered IV insulin and has been transitioned to SQ insulin this morning. Would recommend decreasing Levemir slightly and decreasing Novolog correction scale.  Thanks, Orlando Penner, RN, MSN, CDE Diabetes Coordinator Inpatient Diabetes Program [phone] (Team Pager from 8am to 5pm)

## 2020-12-27 ENCOUNTER — Other Ambulatory Visit: Payer: Self-pay

## 2020-12-27 LAB — GLUCOSE, CAPILLARY
Glucose-Capillary: 313 mg/dL — ABNORMAL HIGH (ref 70–99)
Glucose-Capillary: 216 mg/dL — ABNORMAL HIGH (ref 70–99)

## 2020-12-27 LAB — URINALYSIS, ROUTINE W REFLEX MICROSCOPIC
Bilirubin Urine: NEGATIVE
Glucose, UA: >=500 mg/dL — AB
Ketones, ur: NEGATIVE mg/dL
Nitrite: NEGATIVE
Protein, ur: NEGATIVE mg/dL
Specific Gravity, Urine: 1.001 — ABNORMAL LOW (ref 1.005–1.030)
pH: 7 (ref 5.0–8.0)

## 2020-12-27 LAB — BASIC METABOLIC PANEL
Anion gap: 6 (ref 5–15)
BUN: 11 mg/dL (ref 6–20)
CO2: 19 mmol/L — ABNORMAL LOW (ref 22–32)
Calcium: 8.8 mg/dL — ABNORMAL LOW (ref 8.9–10.3)
Chloride: 107 mmol/L (ref 98–111)
Creatinine, Ser: 1.08 mg/dL — ABNORMAL HIGH (ref 0.44–1.00)
GFR, Estimated: 60 mL/min — ABNORMAL LOW (ref >60)
Glucose, Bld: 212 mg/dL — ABNORMAL HIGH (ref 70–99)
Potassium: 4.5 mmol/L (ref 3.5–5.1)
Sodium: 132 mmol/L — ABNORMAL LOW (ref 135–145)

## 2020-12-27 LAB — CBC
HCT: 33.1 % — ABNORMAL LOW (ref 36.0–46.0)
Hemoglobin: 10.9 g/dL — ABNORMAL LOW (ref 12.0–15.0)
MCH: 31.1 pg (ref 26.0–34.0)
MCHC: 32.9 g/dL (ref 30.0–36.0)
MCV: 94.3 fL (ref 80.0–100.0)
Platelets: 174 10*3/uL (ref 150–400)
RBC: 3.51 MIL/uL — ABNORMAL LOW (ref 3.87–5.11)
RDW: 12.8 % (ref 11.5–15.5)
WBC: 3.9 10*3/uL — ABNORMAL LOW (ref 4.0–10.5)
nRBC: 0 % (ref 0.0–0.2)

## 2020-12-27 LAB — RAPID URINE DRUG SCREEN, HOSP PERFORMED
Amphetamines: NOT DETECTED
Barbiturates: NOT DETECTED
Benzodiazepines: NOT DETECTED
Cocaine: NOT DETECTED
Opiates: NOT DETECTED
Tetrahydrocannabinol: NOT DETECTED

## 2020-12-27 LAB — MAGNESIUM: Magnesium: 1.8 mg/dL (ref 1.7–2.4)

## 2020-12-27 LAB — CREATININE, URINE, RANDOM: Creatinine, Urine: 23.25 mg/dL

## 2020-12-27 LAB — SODIUM, URINE, RANDOM: Sodium, Ur: 13 mmol/L

## 2020-12-27 MED ORDER — INSULIN ASPART 100 UNIT/ML IJ SOLN
5.0000 [IU] | Freq: Three times a day (TID) | INTRAMUSCULAR | Status: DC
  Administered 2020-12-27: 5 [IU] via SUBCUTANEOUS

## 2020-12-27 NOTE — Progress Notes (Signed)
Pt is familiar to this therapist from previous hospitalizations and presents at her baseline. No OT needs, recommend personal care services at home once pt is eligible for Medicaid.    12/27/20 1000  OT Visit Information  Last OT Received On 12/27/20  Assistance Needed +1  History of Present Illness The pt is a 60 yo female presenting 8/18 with hyperglycemia, nausea and vomiting. Pt admitted for continued work up and management of DKA. PMH includes: DM type I, CHF, thalamic stroke in November 2021, tobacco abuse, chronic cholecystitis with calculus.  Precautions  Precautions Fall  Precaution Comments fatigues quickly  Home Living  Family/patient expects to be discharged to: Private residence  Living Arrangements Children  Available Help at Discharge Family;Available PRN/intermittently  Type of Home House  Home Access Ramped entrance  Home Layout One level  Bathroom Shower/Tub Walk-in IT trainer - 4 wheels  Prior Function  Level of Independence Needs assistance  Gait / Transfers Assistance Needed ambulates with rollator  ADL's / Homemaking Assistance Needed daughter helps her bathe and dress and with med management and meals.  Comments sponge bathes  Communication  Communication HOH  Pain Assessment  Pain Assessment Faces  Faces Pain Scale 0  Cognition  Arousal/Alertness Awake/alert  Behavior During Therapy Flat affect  Overall Cognitive Status History of cognitive impairments - at baseline  General Comments pt is familiar to this therapist from previous hospitalizations, at her baseline  Upper Extremity Assessment  Upper Extremity Assessment Generalized weakness  Lower Extremity Assessment  Lower Extremity Assessment Defer to PT evaluation  Cervical / Trunk Assessment  Cervical / Trunk Assessment Kyphotic  ADL  Overall ADL's  At baseline  Vision- History  Patient Visual Report No change from baseline  Bed Mobility  Overal bed  mobility Modified Independent  General bed mobility comments returned to supine in bed, has scooted down in recliner upon entry  Transfers  Overall transfer level Needs assistance  Equipment used 1 person hand held assist  Transfers Sit to/from [employer] Transfers  Sit to Stand Min assist  Stand pivot transfers Min assist  General transfer comment steadying assist for pivot back to bed  Balance  Overall balance assessment Mild deficits observed, not formally tested  OT - End of Session  Equipment Utilized During Treatment Gait belt  Activity Tolerance Patient tolerated treatment well  Patient left in bed;with call bell/phone within reach;with bed alarm set  OT Assessment  OT Recommendation/Assessment Patient does not need any further OT services  OT Visit Diagnosis Unsteadiness on feet (R26.81);Other abnormalities of gait and mobility (R26.89);Other symptoms and signs involving cognitive function;Muscle weakness (generalized) (M62.81)  AM-PAC OT "6 Clicks" Daily Activity Outcome Measure (Version 2)  Help from another person eating meals? 4  Help from another person taking care of personal grooming? 3  Help from another person toileting, which includes using toliet, bedpan, or urinal? 2  Help from another person bathing (including washing, rinsing, drying)? 2  Help from another person to put on and taking off regular upper body clothing? 2  Help from another person to put on and taking off regular lower body clothing? 2  6 Click Score 15  Progressive Mobility  What is the highest level of mobility based on the progressive mobility assessment? Level 4 (Walks with assist in room) - Balance while marching in place and cannot step forward and back - Complete  Mobility Ambulated with assistance in room  OT Recommendation  Follow  Up Recommendations No OT follow up  OT Equipment None recommended by OT  Acute Rehab OT Goals  Patient Stated Goal get her strength back  OT Time  Calculation  OT Start Time (ACUTE ONLY) 1030  OT Stop Time (ACUTE ONLY) 1045  OT Time Calculation (min) 15 min  OT General Charges  $OT Visit 1 Visit  OT Evaluation  $OT Eval Moderate Complexity 1 Mod  Written Expression  Dominant Hand Right  Martie Round, OTR/L Acute Rehabilitation Services Pager: [phone] Office: [phone]

## 2020-12-27 NOTE — TOC Transition Note (Addendum)
Transition of Care [facility]) - CM/SW Discharge Note   Patient Details  Name: Christine Benitez MRN: [id number] Date of Birth: Jun 23, 1960  Transition of Care [facility]) CM/SW Contact:  Kallie Locks, RN Phone Number: [phone] 12/27/2020, 12:53 PM   Clinical Narrative:   Patient to transition home today. Lives with daughter Christine Benitez. Patient is extremely hard of hearing. Lexicographer permission to contact daughter Christine Benitez to discuss transition plans.   Called daughter Christine Benitez [phone]. Discussed recommendations for home health PT. Ms. Stauber does not have insurance. Patient will have charity home health with [facility] for PT services. Latori states patient has rolling walker but will need 3 in 1 commode.   Daughter states patient has medications at home. Unable to utilize [facility] letter due to the pharmacies are no where close to patient's residence. Also daughter states they do not have transportation. Closest city is [place]. Will send good rx card home with [facility].  Called Christy with [facility] to make referral for charity home health PT. Made Christy aware daughter Christine Benitez will be main contact due to patient being very hard of hearing. Christy with [facility] states the office will process home health referral on Monday. Office is closed today to process charity request.   [employer] contacted for 3in1 commode and request made for it to be delivered to patient's home.  [facility] called at 1245 pm. Provided [facility] paperwork and good rx card to patient's nurse.  No further TOC needs assessed. Please re-consult if needed.    Final next level of care: Home w Home Health Services Barriers to Discharge: No Barriers Identified   Patient Goals and CMS Choice Patient states their goals for this hospitalization and ongoing recovery are:: return home CMS Medicare.gov Compare Post Acute Care list provided to:: Patient Represenative (must comment) (daughterFrench Benitez) Choice offered to / list presented to : Adult  Children  Discharge Placement                 Discharge Plan and Services                DME Arranged: 3-N-1 DME Agency: [employer] Date DME Agency Contacted: 12/27/20 Time DME Agency Contacted: 1215 Representative spoke with at DME Agency: Leavy Cella HH Arranged: PT HH Agency: [facility] Date [facility] Agency Contacted: 12/27/20 Time HH Agency Contacted: 1145 Representative spoke with at [facility] Agency: Neysa Bonito  Social Determinants of Health (SDOH) Interventions     Readmission Risk Interventions Readmission Risk Prevention Plan 11/18/2020 11/06/2020  Transportation Screening Complete Complete  PCP or Specialist Appt within 5-7 Days - Not Complete  Not Complete comments - July 29  PCP or Specialist Appt within 3-5 Days Complete -  Home Care Screening - Complete  Medication Review (RN CM) - Complete  HRI or Home Care Consult Complete -  Social Work Consult for Recovery Care Planning/Counseling Complete -  Palliative Care Screening Complete -  Medication Review Oceanographer) Complete -  Some recent data might be hidden

## 2020-12-27 NOTE — Plan of Care (Signed)

## 2020-12-27 NOTE — Evaluation (Signed)
Physical Therapy Evaluation Patient Details Name: Christine Benitez MRN: [id number] DOB: 1960/10/28 Today's Date: 12/27/2020   History of Present Illness  The pt is a 60 yo female presenting 8/18 with hyperglycemia, nausea and vomiting. Pt admitted for continued work up and management of DKA. PMH includes: DM type I, CHF, thalamic stroke in November 2021, tobacco abuse, chronic cholecystitis with calculus.   Clinical Impression  Pt in bed upon arrival of PT, agreeable to evaluation at this time. Prior to admission the pt reports she was mobilizing in the home with 4-wheel walker and receiving assist from daughter for ADLs. The pt now presents with limitations in functional mobility, strength, power, endurance, and dynamic stability due to above dx, but is likely close to her baseline. The pt was able to complete bed mobility without physical assist, and completed sit-stand transfers and hallway ambulation with minG and use of RW. The pt ambulates very slowly with minimal foot clearance and stride length, required BUE support, and minA for navigation at times. She is at significant increased risk for falls, and would benefit from HHPT to improve functional strength and to address balance to reduce risk of falls.      Follow Up Recommendations Home health PT;Supervision for mobility/OOB    Equipment Recommendations  3in1 (PT)    Recommendations for Other Services       Precautions / Restrictions Precautions Precautions: Fall Precaution Comments: fatigues quickly Restrictions Weight Bearing Restrictions: No      Mobility  Bed Mobility Overal bed mobility: Modified Independent             General bed mobility comments: no physical assist given, but increased time from flat bed    Transfers Overall transfer level: Needs assistance Equipment used: Rolling walker (2 wheeled) Transfers: Sit to/from Stand Sit to Stand: Min assist         General transfer comment: minA to steady  in initial stand, pt with posterior bias and leaning BLE against bed for support. able to correct and then needing minG. no physical assist to power up, only cues for hand placement  Ambulation/Gait Ambulation/Gait assistance: Min guard;Min assist Gait Distance (Feet): 40 Feet Assistive device: Rolling walker (2 wheeled) Gait Pattern/deviations: Step-through pattern;Decreased stride length;Decreased dorsiflexion - right;Decreased dorsiflexion - left;Shuffle;Trunk flexed;Narrow base of support Gait velocity: 0.09 m/s Gait velocity interpretation: <1.31 ft/sec, indicative of household ambulator General Gait Details: pt with very small, shuffling steps with minimal clearance, DF, and stride length. pt needing repeated cues for posture, forward gaze, and to improve speed, but suspect this is close to her baseline      Balance Overall balance assessment: Mild deficits observed, not formally tested                                           Pertinent Vitals/Pain Pain Assessment: Faces Faces Pain Scale: Hurts little more Pain Location: back Pain Descriptors / Indicators: Discomfort Pain Intervention(s): Limited activity within patient's tolerance;Monitored during session;Repositioned    Home Living Family/patient expects to be discharged to:: Private residence Living Arrangements: Children;Other relatives Available Help at Discharge: Family;Available PRN/intermittently Type of Home: House Home Access: Ramped entrance     Home Layout: One level Home Equipment: Walker - 4 wheels Additional Comments: no family present to confirm    Prior Function Level of Independence: Independent with assistive device(s)  Comments: pt reports walking with rollator in the home, assist from her daughter to get washed up at sink     Hand Dominance   Dominant Hand: Right    Extremity/Trunk Assessment   Upper Extremity Assessment Upper Extremity Assessment: Generalized  weakness    Lower Extremity Assessment Lower Extremity Assessment: Generalized weakness    Cervical / Trunk Assessment Cervical / Trunk Assessment: Kyphotic  Communication   Communication: HOH  Cognition Arousal/Alertness: Awake/alert Behavior During Therapy: WFL for tasks assessed/performed Overall Cognitive Status: Impaired/Different from baseline Area of Impairment: Awareness;Problem solving                           Awareness: Emergent Problem Solving: Slow processing;Requires verbal cues;Decreased initiation;Difficulty sequencing General Comments: pt needing increased time ot respond to questions, difficulty avoiding objects with management of RW, needing cues and intermittent physical assist to steer      General Comments General comments (skin integrity, edema, etc.): VSS with mobility despite pt reporting slight dizziness with initial stand    Exercises     Assessment/Plan    PT Assessment Patient needs continued PT services  PT Problem List Decreased strength;Decreased range of motion;Decreased activity tolerance;Decreased balance       PT Treatment Interventions DME instruction;Gait training;Stair training;Functional mobility training;Therapeutic activities;Therapeutic exercise;Balance training;Patient/family education    PT Goals (Current goals can be found in the Care Plan section)  Acute Rehab PT Goals Patient Stated Goal: get her strength back PT Goal Formulation: With patient Time For Goal Achievement: 01/03/21 Potential to Achieve Goals: Good    Frequency Min 3X/week    AM-PAC PT "6 Clicks" Mobility  Outcome Measure Help needed turning from your back to your side while in a flat bed without using bedrails?: None Help needed moving from lying on your back to sitting on the side of a flat bed without using bedrails?: None Help needed moving to and from a bed to a chair (including a wheelchair)?: A Little Help needed standing up from a chair  using your arms (e.g., wheelchair or bedside chair)?: A Little Help needed to walk in hospital room?: A Little Help needed climbing 3-5 steps with a railing? : A Lot 6 Click Score: 19    End of Session Equipment Utilized During Treatment: Gait belt Activity Tolerance: Patient tolerated treatment well;Patient limited by fatigue Patient left: in chair;with call bell/phone within reach;with nursing/sitter in room Nurse Communication: Mobility status PT Visit Diagnosis: Other abnormalities of gait and mobility (R26.89);Muscle weakness (generalized) (M62.81)    Time: 9323-5573 PT Time Calculation (min) (ACUTE ONLY): 37 min   Charges:   PT Evaluation $PT Eval Low Complexity: 1 Low PT Treatments $Gait Training: 8-22 mins        Lazarus Gowda, PT, DPT   Acute Rehabilitation Department Pager #: [phone]  Ronnie Derby 12/27/2020, 9:20 AM

## 2020-12-27 NOTE — TOC CM/SW Note (Signed)
Telephone call received from [facility] with [facility] stating patient is showing up to have BCBS commercial plan not self pay. Unable to accept if [employer]BS  Telephone call to daughter [name] who verifies patient does indeed have BCBS. Shows up as self pay in Epic.   [facility] cannot accept BCBS. Referral made to [facility] with Frances Furbish to make referral for home health PT. Kandee Keen is able to accept referral.   Raiford Noble, MSN, RN,BSN [facility]patient [facility] Case Manager [phone]

## 2020-12-27 NOTE — Discharge Summary (Signed)
Physician Discharge Summary  Christine Benitez JOI:[id number] DOB: 1960-10-01 DOA: 12/25/2020  PCP: Lupita Dawn, MD  Admit date: 12/25/2020 Discharge date: 12/27/2020  Admitted From: Home Disposition: Home  Recommendations for Outpatient Follow-up:  Follow up with PCP in 1-2 weeks Please obtain BMP/CBC in one week your next doctors visit.  Continue taking Lantus 7 units twice daily, 5 units NovoLog Premeal's.  Need to check blood glucose 3 times a day before meal and 1 time before bedtime. Should get outpatient endocrinology referral by her PCP if it remains uncontrolled    Discharge Condition: Stable CODE STATUS: DNR Diet recommendation: Diabetic  Brief/Interim Summary: 60 year old with DM1, diastolic CHF, nonischemic CM admitted for nausea vomiting found to be in DKA started on DKA protocol.  Upon admission patient also had AKI with creatinine greater than 2.0, baseline creatinine 1.3.  During the hospitalization initially she was started on insulin drip and later transitioned to subcu insulin, her home regimen which improved her blood glucose.  She was eval by PT who recommended she was at baseline and can get home health services if necessary.  TOC to make arrangements.  Renal function resolved with IV fluids.  Patient's daughter updated on the day of discharge as well.  All questions answered.  She is stable for discharge today.     Assessment & Plan:   Principal Problem:   DKA (diabetic ketoacidosis) (HCC) Active Problems:   AKI (acute kidney injury) (HCC)   Nausea & vomiting   Dehydration   Chronic diastolic CHF (congestive heart failure) (HCC)     Diabetic ketoacidosis, resolved Diabetes mellitus type 2, insulin-dependent - DKA now has resolved.  Blood glucose is better, continue insulin regimen as stated above. -A1c 10.1.  May benefit from outpatient referral to endocrinology.   Acute kidney injury; resolved - Baseline creatinine 1.2.  Admission creatinine 2.0 improved  with IV fluids.  Discharge creatinine 1.08   Congestive heart failure with preserved ejection fraction, EF 55% - Supportive care.  Resume home medications   What with physical therapy who thought she was at her baseline  Body mass index is 16.39 kg/m.         Discharge Diagnoses:  Principal Problem:   DKA (diabetic ketoacidosis) (HCC) Active Problems:   AKI (acute kidney injury) (HCC)   Nausea & vomiting   Dehydration   Chronic diastolic CHF (congestive heart failure) (HCC)     Subjective: Feels better no complaints.  Very hard of hearing.  Discharge Exam: Vitals:   12/27/20 0800 12/27/20 1130  BP: 122/70 105/67  Pulse:  71  Resp: 16 18  Temp:  98.4 F (36.9 C)  SpO2:  100%   Vitals:   12/27/20 0609 12/27/20 0700 12/27/20 0800 12/27/20 1130  BP:  101/65 122/70 105/67  Pulse:  79  71  Resp:  15 16 18   Temp:  98.2 F (36.8 C)  98.4 F (36.9 C)  TempSrc:  Oral  Oral  SpO2:  98%  100%  Weight: 43.3 kg       General: Pt is alert, awake, not in acute distress, very hard of hearing Cardiovascular: RRR, S1/S2 +, no rubs, no gallops Respiratory: CTA bilaterally, no wheezing, no rhonchi Abdominal: Soft, NT, ND, bowel sounds + Extremities: no edema, no cyanosis  Discharge Instructions   Allergies as of 12/27/2020   No Known Allergies      Medication List     TAKE these medications    albuterol 108 (90 Base) MCG/ACT inhaler Commonly  known as: VENTOLIN HFA Inhale 2 puffs into the lungs every 6 (six) hours as needed for wheezing or shortness of breath.   aspirin 81 MG EC tablet TAKE 1 TABLET (81 MG TOTAL) BY MOUTH DAILY.   atorvastatin 40 MG tablet Commonly known as: LIPITOR Take 1 tablet (40 mg total) by mouth every evening.   clopidogrel 75 MG tablet Commonly known as: Plavix Take 1 tablet (75 mg total) by mouth daily.   dronabinol 5 MG capsule Commonly known as: MARINOL Take 1 capsule (5 mg total) by mouth 2 (two) times daily before lunch  and supper.   feeding supplement Liqd Take 237 mLs by mouth 2 (two) times daily between meals.   feeding supplement Liqd Take 237 mLs by mouth 3 (three) times daily between meals.   insulin glargine 100 UNIT/ML Solostar Pen Commonly known as: LANTUS Inject 7 Units into the skin 2 (two) times daily. What changed: how much to take   ivabradine 5 MG Tabs tablet Commonly known as: Corlanor Take 0.5 tablets (2.5 mg total) by mouth 2 (two) times daily with a meal.   mirtazapine 15 MG disintegrating tablet Commonly known as: REMERON SOL-TAB Take 0.5 tablets (7.5 mg total) by mouth at bedtime.   multivitamin with minerals Tabs tablet Take 1 tablet by mouth daily.   NovoLOG FlexPen 100 UNIT/ML FlexPen Generic drug: insulin aspart Inject 5 Units into the skin 3 (three) times daily with meals.   Pentips 32G X 4 MM Misc Generic drug: Insulin Pen Needle Use as directed   sodium zirconium cyclosilicate 10 g Pack packet Commonly known as: LOKELMA Take 10 g by mouth daily.        Follow-up Information     Lupita Dawn, MD Follow up in 1 week(s).   Specialty: Family Medicine Contact information: [address] [place] [address] [phone]         Thomasene Ripple, DO .   Specialty: Cardiology Contact information: [address] [place] [address] [phone]                No Known Allergies  You were cared for by a hospitalist during your hospital stay. If you have any questions about your discharge medications or the care you received while you were in the hospital after you are discharged, you can call the unit and asked to speak with the hospitalist on call if the hospitalist that took care of you is not available. Once you are discharged, your primary care physician will handle any further medical issues. Please note that no refills for any discharge medications will be authorized once you are discharged, as it is imperative that you return to  your primary care physician (or establish a relationship with a primary care physician if you do not have one) for your aftercare needs so that they can reassess your need for medications and monitor your lab values.   Procedures/Studies: DG CHEST PORT 1 VIEW  Result Date: 12/26/2020 CLINICAL DATA:  DKA. EXAM: PORTABLE CHEST 1 VIEW COMPARISON:  Chest x-ray dated November 24, 2020. FINDINGS: The heart size and mediastinal contours are within normal limits. Both lungs are clear. The visualized skeletal structures are unremarkable. IMPRESSION: No active disease. Electronically Signed   By: Obie Dredge M.D.   On: 12/26/2020 06:50     The results of significant diagnostics from this hospitalization (including imaging, microbiology, ancillary and laboratory) are listed below for reference.     Microbiology: Recent Results (from  the past 240 hour(s))  SARS CORONAVIRUS 2 (TAT 6-24 HRS) Nasopharyngeal Nasopharyngeal Swab     Status: None   Collection Time: 12/25/20  6:33 PM   Specimen: Nasopharyngeal Swab  Result Value Ref Range Status   SARS Coronavirus 2 NEGATIVE NEGATIVE Final    Comment: (NOTE) SARS-CoV-2 target nucleic acids are NOT DETECTED.  The SARS-CoV-2 RNA is generally detectable in upper and lower respiratory specimens during the acute phase of infection. Negative results do not preclude SARS-CoV-2 infection, do not rule out co-infections with other pathogens, and should not be used as the sole basis for treatment or other patient management decisions. Negative results must be combined with clinical observations, patient history, and epidemiological information. The expected result is Negative.  Fact Sheet for Patients: HairSlick.nohttps://www.fda.gov/media/138098/download  Fact Sheet for Healthcare Providers: quierodirigir.comhttps://www.fda.gov/media/138095/download  This test is not yet approved or cleared by the [place] FDA and  has been authorized for detection and/or diagnosis of SARS-CoV-2  by FDA under an Emergency Use Authorization (EUA). This EUA will remain  in effect (meaning this test can be used) for the duration of the COVID-19 declaration under Se ction 564(b)(1) of the Act, 21 U.S.C. section 360bbb-3(b)(1), unless the authorization is terminated or revoked sooner.  Performed at [facility], [address] [place], [place] [address]   MRSA Next Gen by PCR, Nasal     Status: None   Collection Time: 12/26/20  1:59 AM   Specimen: Nasal Mucosa; Nasal Swab  Result Value Ref Range Status   MRSA by PCR Next Gen NOT DETECTED NOT DETECTED Final    Comment: (NOTE) The GeneXpert MRSA Assay (FDA approved for NASAL specimens only), is one component of a comprehensive MRSA colonization surveillance program. It is not intended to diagnose MRSA infection nor to guide or monitor treatment for MRSA infections. Test performance is not FDA approved in patients less than 59 years old. Performed at [facility], [address] [place], [place] [address]      Labs: BNP (last 3 results) No results for input(s): BNP in the last 8760 hours. Basic Metabolic Panel: Recent Labs  Lab 12/25/20 1441 12/25/20 2040 12/25/20 2057 12/25/20 2225 12/26/20 0604 12/26/20 0932 12/27/20 0113  NA 128*  --  132* 132* 133* 132* 132*  K 4.7  --  4.8 4.5 3.8 3.9 4.5  CL 96*  --   --  99 103 101 107  CO2 12*  --   --  16* 22 24 19*  GLUCOSE 466*  --   --  311* 203* 215* 212*  BUN 38*  --   --  31* 21* 18 11  CREATININE 2.00*  --   --  1.56* 1.15* 1.13* 1.08*  CALCIUM 9.8  --   --  9.6 9.3 9.4 8.8*  MG  --  2.2  --   --  1.8  --  1.8  PHOS  --   --   --   --  2.1*  --   --    Liver Function Tests: Recent Labs  Lab 12/25/20 1441 12/26/20 0604  AST 17 15  ALT 11 8  ALKPHOS 54 44  BILITOT 2.1* 0.5  PROT 7.2 5.9*  ALBUMIN 3.8 3.0*   No results for input(s): LIPASE, AMYLASE in the last 168 hours. No results for input(s): AMMONIA in the last 168 hours. CBC: Recent Labs   Lab 12/25/20 1441 12/25/20 2057 12/26/20 0604 12/27/20 0113  WBC 5.8  --  4.1 3.9*  NEUTROABS 4.0  --   --   --   HGB 13.0 12.6 11.0* 10.9*  HCT 41.3 37.0 32.7* 33.1*  MCV 99.3  --  92.6 94.3  PLT 209  --  180 174   Cardiac Enzymes: No results for input(s): CKTOTAL, CKMB, CKMBINDEX, TROPONINI in the last 168 hours. BNP: Invalid input(s): POCBNP CBG: Recent Labs  Lab 12/26/20 1230 12/26/20 1658 12/26/20 2120 12/27/20 0609 12/27/20 1129  GLUCAP 235* 224* 218* 313* 216*   D-Dimer No results for input(s): DDIMER in the last 72 hours. Hgb A1c Recent Labs    12/26/20 0932  HGBA1C 10.1*   Lipid Profile No results for input(s): CHOL, HDL, LDLCALC, TRIG, CHOLHDL, LDLDIRECT in the last 72 hours. Thyroid function studies No results for input(s): TSH, T4TOTAL, T3FREE, THYROIDAB in the last 72 hours.  Invalid input(s): FREET3 Anemia work up No results for input(s): VITAMINB12, FOLATE, FERRITIN, TIBC, IRON, RETICCTPCT in the last 72 hours. Urinalysis    Component Value Date/Time   COLORURINE STRAW (A) 12/25/2020 2031   APPEARANCEUR CLEAR 12/25/2020 2031   LABSPEC 1.001 (L) 12/25/2020 2031   PHURINE 7.0 12/25/2020 2031   GLUCOSEU >=500 (A) 12/25/2020 2031   HGBUR SMALL (A) 12/25/2020 2031   BILIRUBINUR NEGATIVE 12/25/2020 2031   KETONESUR NEGATIVE 12/25/2020 2031   PROTEINUR NEGATIVE 12/25/2020 2031   NITRITE NEGATIVE 12/25/2020 2031   LEUKOCYTESUR LARGE (A) 12/25/2020 2031   Sepsis Labs Invalid input(s): PROCALCITONIN,  WBC,  LACTICIDVEN Microbiology Recent Results (from the past 240 hour(s))  SARS CORONAVIRUS 2 (TAT 6-24 HRS) Nasopharyngeal Nasopharyngeal Swab     Status: None   Collection Time: 12/25/20  6:33 PM   Specimen: Nasopharyngeal Swab  Result Value Ref Range Status   SARS Coronavirus 2 NEGATIVE NEGATIVE Final    Comment: (NOTE) SARS-CoV-2 target nucleic acids are NOT DETECTED.  The SARS-CoV-2 RNA is generally detectable in upper and  lower respiratory specimens during the acute phase of infection. Negative results do not preclude SARS-CoV-2 infection, do not rule out co-infections with other pathogens, and should not be used as the sole basis for treatment or other patient management decisions. Negative results must be combined with clinical observations, patient history, and epidemiological information. The expected result is Negative.  Fact Sheet for Patients: HairSlick.no  Fact Sheet for Healthcare Providers: quierodirigir.com  This test is not yet approved or cleared by the [place] FDA and  has been authorized for detection and/or diagnosis of SARS-CoV-2 by FDA under an Emergency Use Authorization (EUA). This EUA will remain  in effect (meaning this test can be used) for the duration of the COVID-19 declaration under Se ction 564(b)(1) of the Act, 21 U.S.C. section 360bbb-3(b)(1), unless the authorization is terminated or revoked sooner.  Performed at [facility], [address], [place], [place] [address]   MRSA Next Gen by PCR, Nasal     Status: None   Collection Time: 12/26/20  1:59 AM   Specimen: Nasal Mucosa; Nasal Swab  Result Value Ref Range Status   MRSA by PCR Next Gen NOT DETECTED NOT DETECTED Final    Comment: (NOTE) The GeneXpert MRSA Assay (FDA approved for NASAL specimens only), is one component of a comprehensive MRSA colonization surveillance program. It is not intended to diagnose MRSA infection nor to guide or monitor treatment for MRSA infections. Test performance is not FDA approved in patients less than 34 years old. Performed at [facility], [address], [place],  [place] [address]      Time coordinating discharge:  I have spent 35 minutes face to face with the patient and on the ward discussing the patients care, assessment, plan and disposition with other care givers. >50% of the time was devoted  counseling the patient about the risks and benefits of treatment/Discharge disposition and coordinating care.   SIGNED:   Dimple Nanas, MD  [facility] 12/27/2020, 12:03 PM   If 7PM-7AM, please contact night-coverage

## 2020-12-31 NOTE — Progress Notes (Signed)
Received message from [place] with [place]. Patient does not have active / current insurance with [employer]BS and is self pay. They cannot accept the patient. [facility] called. They cannot accept the patient. TCT - Latori Danielle Arroyave (Christine Benitez) to inform her of the difficulties in obtaining HHPT, she expressed understanding. Daughter stated that pt is up moving around. Alexis Goodell Transition of Care Supervisor [phone]

## 2021-02-09 ENCOUNTER — Encounter: Payer: Self-pay | Admitting: Adult Health

## 2021-02-09 ENCOUNTER — Ambulatory Visit: Payer: MEDICAID | Admitting: Adult Health

## 2021-02-09 NOTE — Progress Notes (Deleted)
[facility] [address] [place]. [place] [address] [phone]       STROKE FOLLOW UP NOTE  [name] Date of Birth:  [birth year] Medical Record Number:  [id number]   Reason for Referral: stroke follow up    SUBJECTIVE:   CHIEF COMPLAINT:  No chief complaint on file.   HPI:   Christine Benitez is a 60 y.o. 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    Today, 02/09/2021, returns for stroke follow-up.  She has since returned back home from SNF.  Overall stable from stroke standpoint.  Compliant on aspirin, Plavix and atorvastatin without side effects.  Blood pressure today ***.   She has had multiple hospitalizations since prior visit       History provided for reference purposes only Update 07/28/2020 JM: Christine Benitez returns for 73-month stroke follow-up.  Continues to reside at [facility] and accompanied by facility aide, Minette Brine, who is very familiar with Christine Benitez even prior to her stroke  Reports improvement of left-sided deficits.  Not currently working with therapies. Ambulates short distance with RW - eager to return home but per aide, they are concerned regarding unsafe discharge as she does not have adequate family support at home Denies new or worsening stroke/TIA symptoms  No specific complaints today Review of facility MAR, continues on aspirin, Plavix and atorvastatin Blood pressure today 107/62 on hydralazine per facility  Glucose levels stable per aide on Lantus, Humalog and Metformin per facility   She has had follow-up with cardiology Dr. Harriet Masson Has not been contacted yet by IR Dr. Estanislado Pandy for evaluation of BA stenosis  History provided for reference purposes only Initial visit 04/28/2020 JM: Christine Benitez is being seen for hospital follow-up accompanied by facility aide.  Currently residing at [facility].  Reports residual mild left-sided weakness, dysarthria and gait impairment.  Does report improvement and currently working with PT/OT/SLP.  Denies new or worsening stroke/TIA symptoms.  Per facility [facility], she has remained on aspirin 81 mg daily, Plavix and atorvastatin 40 mg daily.  Blood pressure today 102/69.  Blood pressures monitored at facility and she believes these have been stable.  Glucose levels monitored at facility and she believes these have been stable.  No concerns at this time.  Stroke admission 03/11/2020 Christine Benitez is a 60 y.o. female with history of of CHF, HTN, IDDM, iron deficiency anemia, NICM, CAD, pulmonary hypertension and smoking found down, who presented on 03/11/2020 with lethargy and L sided weakness .  Personally reviewed hospitalization pertinent progress notes, lab work and imaging with summary provided.  Initially evaluated by Dr. Erlinda Hong with stroke work-up revealing anterior right thalamic infarct and midbrain infarcts, secondary to small vs large vessel disease source although cardioembolic source cannot be completely excluded in setting of low EF.  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  Also recommended 30-day cardiac event monitor to rule out A. fib.  Placed on DAPT for secondary stroke prevention.  HTN stable.  LDL 41 and increase atorvastatin from 20 mg to 40 mg daily.  Uncontrolled DM with A1c 7.3.  Other stroke risk factors include prior strokes on imaging, former tobacco use, family history of stroke, CAD on medical management.  Residual deficits of cognitive impairment, mild to moderate dysarthria, left facial droop, and  left hemiparesis.  Evaluated by therapies and discharged to SNF for ongoing therapy needs.  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 large vessel disease source.  Cardioembolic source cannot be completely excluded  due to low EF. CT head R thalamic hypodensity. Old L parietal cortial and subcortical infarct.  MRI  Anterior R thalamic into parasagittal R midbrain infarct  CTA head & neck Limited eval d/t bolus timing - severe distal BA stenosis, B PCA narrowing and possible proximal R PComA significant stenosis, possible R cavernous ICA stenosis. R>L pleural effusions.   Plan reassess for cerebral angiogram when medically more stable to evaluate basilar artery stenosis 2D Echo EF 20 to 25%.  LA moderately dilated. LDL 41 HgbA1c 7.3 VTE prophylaxis - Lovenox 40 mg sq daily  aspirin 81 mg daily prior to admission, now on aspirin 325 mg daily and clopidogrel 75 mg daily.  Therapy recommendations:  SNF Disposition:   SNF    ROS:   14 system review of systems performed and negative with exception of those listed in HPI  PMH:  Past Medical History:  Diagnosis Date   Abnormal LFTs    a. 05/2014 -  ALT 52, alk phos 130.   Chronic systolic CHF (congestive heart failure) ([place])    a. Dx 05/2014 - echo at [facility] - EF 30-35%, moderate LVH, no rWMA, mild to moderate MR, moderate PH with PA pressure 60-15mmHg, mild to moderate pericardial effusion. EF 30-35% by cath.   History of blood transfusion 1986   "related to C-section"   Hypertension    Insulin dependent diabetes mellitus    Iron deficiency anemia    NICM (nonischemic cardiomyopathy) ([place])    a. [place] 06/11/14:  minor nonobstructive CAD with mild irregularities in the LAD less than 10-20%, LCx with mild disease less than 20%, no significant disease in the RCA, EF 30-35%.   Pericardial effusion    a. Mild-moderate pericardial effusion by echo at [facility].   Pulmonary hypertension ([place])    a. Moderate PH by echo at [facility].   Tobacco abuse     PSH:  Past Surgical History:  Procedure Laterality Date   APPENDECTOMY  ~ 2003   [place] WITH CORONARY ANGIOGRAM N/A 06/11/2014   Procedure: LEFT HEART  CATHETERIZATION WITH CORONARY ANGIOGRAM;  Surgeon: Peter M Martinique, MD;  Location: [facility] CATH LAB;  Service: Cardiovascular;  Laterality: N/A;    Social History:  Social History   Socioeconomic History   Marital status: Divorced    Spouse name: Not on file   Number of children: Not on file   Years of education: Not on file   Highest education level: Not on file  Occupational History   Not on file  Tobacco Use   Smoking status: Former    Packs/day: 0.50    Years: 22.00    Pack years: 11.00    Types: Cigarettes    Quit date: 03/26/2015    Years since quitting: 5.8   Smokeless tobacco: Never  Vaping Use   Vaping Use: Never used  Substance and Sexual Activity   Alcohol use: No   Drug use: No   Sexual activity: Yes    Birth control/protection: None  Other Topics Concern   Not on file  Social History Narrative   Not on file   Social Determinants of Health   Financial Resource Strain: Not on file  Food Insecurity: Not on file  Transportation Needs: Not on file  Physical Activity: Not on file  Stress: Not on file  Social Connections: Not on file  Intimate Partner Violence: Not on file    Family History:  Family History  Problem Relation Age of Onset   Coronary artery disease Mother    Diabetes Mother    Lupus Mother    Stroke Mother    Lung cancer Father     Medications:   Current Outpatient Medications on File Prior to Visit  Medication Sig Dispense Refill   albuterol (VENTOLIN HFA) 108 (90 Base) MCG/ACT inhaler Inhale 2 puffs into the lungs every 6 (six) hours as needed for wheezing or shortness of breath. 18 g 2   aspirin 81 MG EC tablet TAKE 1 TABLET (81 MG TOTAL) BY MOUTH DAILY. (Patient taking differently: Take 81 mg by mouth daily.) 30 tablet 3   atorvastatin (LIPITOR) 40 MG tablet Take 1 tablet (40 mg total) by mouth every evening. 90 tablet 0   clopidogrel (PLAVIX) 75 MG tablet Take 1 tablet (75 mg total) by mouth daily. 30 tablet 11   dronabinol (MARINOL)  5 MG capsule Take 1 capsule (5 mg total) by mouth 2 (two) times daily before lunch and supper. 60 capsule 0   feeding supplement (ENSURE ENLIVE / ENSURE PLUS) LIQD Take 237 mLs by mouth 2 (two) times daily between meals. (Patient not taking: Reported on 12/26/2020) 237 mL 12   feeding supplement (ENSURE ENLIVE / ENSURE PLUS) LIQD Take 237 mLs by mouth 3 (three) times daily between meals. (Patient not taking: Reported on 12/26/2020) 237 mL 12   insulin aspart (NOVOLOG FLEXPEN) 100 UNIT/ML FlexPen Inject 5 Units into the skin 3 (three) times daily with meals. 15 mL 0   insulin glargine (LANTUS) 100 UNIT/ML Solostar Pen Inject 7 Units into the skin 2 (two) times daily. (Patient taking differently: Inject 10 Units into the skin 2 (two) times daily.) 15 mL 0   Insulin Pen Needle (PENTIPS) 32G X 4 MM MISC Use as directed 100 each 3   ivabradine (CORLANOR) 5 MG TABS tablet Take 0.5 tablets (2.5 mg total) by mouth 2 (two) times daily with a meal. 30 tablet 11   mirtazapine (REMERON SOL-TAB) 15 MG disintegrating tablet Take 0.5 tablets (7.5 mg total) by mouth at bedtime. 30 tablet 0   Multiple Vitamin (MULTIVITAMIN WITH MINERALS) TABS tablet Take 1 tablet by mouth daily.     [DISCONTINUED] insulin aspart protamine - aspart (NOVOLOG 70/30 MIX) (70-30) 100 UNIT/ML FlexPen Inject 0.04 mLs (4 Units total) into the skin 2 (two) times daily with a meal. 15 mL 0   No current facility-administered medications on file prior to visit.    Allergies:  No Known Allergies    OBJECTIVE:  Physical Exam  There were no vitals filed for this visit.  There is no height or weight on file to calculate BMI. No results found.  General: Frail very pleasant appearing older than stated age African-American female, seated, in no evident distress Head: head normocephalic and atraumatic.   Neck: supple with no carotid or supraclavicular bruits Cardiovascular: regular rate and rhythm, no murmurs Musculoskeletal: no  deformity Skin:  no rash/petichiae Vascular:  Normal pulses all extremities   Neurologic Exam Mental Status: Awake and fully alert. Mild dysarthria. Oriented to place and time. Recent and remote memory intact during visit. Attention span, concentration and fund of knowledge appears appropriate during visit. Mood and affect appropriate.  Cranial Nerves: R Pupil briskly reactive to  light.  Chronic left eye blindness.  Extraocular movements full without nystagmus. Visual fields full to confrontation.  Severe HOH bilaterally.  Facial sensation intact.  Mild left nasolabial fold flattening.  Tongue, palate moves normally and symmetrically.  Motor: Normal bulk and tone.  Full strength in all tested extremities except slightly decreased left hand grip strength Sensory.: intact to touch , pinprick , position and vibratory sensation.  Coordination: Rapid alternating movements normal in all extremities except decreased left hand. Finger-to-nose and heel-to-shin performed accurately bilaterally. Gait and Station: Deferred as rolling walker not present during visit Reflexes: 1+ and symmetric. Toes downgoing.         ASSESSMENT: Christine Benitez is a 60 y.o. year old female presented with lethargy and left-sided weakness on 03/11/2020 with stroke work-up revealing anterior right thalamic infarct and midbrain infarcts, secondary to small vs large vessel disease although cardioembolic source cannot be completely excluded due to low EF. Vascular risk factors include basilar artery stenosis, cardiomyopathy, acute on chronic systolic CHF with cardiogenic shock 11/5, HTN, HLD, DM, AKI on CKD, former tobacco use, family history of stroke, CAD and prior strokes on imaging.      PLAN:  R thalamic and midbrain strokes :  Residual deficit: Slightly weak left hand strength, gait impairment and mild dysarthria.  Apparently completed working with therapies at SNF -she is eager to return home but per aide, SNF has  concerns regarding safety as she does not have adequate support.  This was discussed with patient Continue aspirin 81 mg daily and clopidogrel 75 mg daily  and atorvastatin for secondary stroke prevention and hx of CAD.   Discussed secondary stroke prevention measures and importance of close PCP follow up for aggressive stroke risk factor management  Basilar artery stenosis: reached out to our referrals department who contacted Dr. Arlean Hopping office and spoke to Cameron -she plans on calling today to schedule visit.  HTN: BP goal <130/90.  Stable on low side today currently on hydralazine per cards HLD: LDL goal <70. Recent LDL 41.  On atorvastatin 40 mg daily per facility DMII: A1c goal<7.0. Recent A1c 7.3.  Currently monitored by facility.  Extensive cardiac history: Routine follow-up with cardiology.  Personally reviewed recent cardiology follow-up note.  Per cardiology, continues DAPT as well as statin usage for hx of CAD.     Follow up in 6 months months or call earlier if needed   CC:  [place] provider: Dr. Cleaster Corin, Jenny Reichmann, MD    I spent 30 minutes of face-to-face and non-face-to-face time with patient and SNF aide.  This included previsit chart review, lab review, study review, order entry, electronic health record documentation, patient education regarding priro stroke and etiology, residual deficits, importance of managing stroke risk factors, evaluation with Dr. Estanislado Pandy and answered all questions to patient and aides satisfaction   Frann Rider, [facility]  [facility] [address] [place], [place] [address]  Phone [phone] Fax [phone] Note: This document was prepared with digital dictation and possible smart phrase technology. Any transcriptional errors that result from this process are unintentional.

## 2021-02-23 ENCOUNTER — Emergency Department (HOSPITAL_COMMUNITY): Payer: Medicaid Other

## 2021-02-23 ENCOUNTER — Inpatient Hospital Stay (HOSPITAL_COMMUNITY)
Admission: EM | Admit: 2021-02-23 | Discharge: 2021-03-04 | DRG: 871 | Disposition: A | Discharge status: Home Health | Payer: Medicaid Other | Attending: Internal Medicine | Admitting: Internal Medicine

## 2021-02-23 DIAGNOSIS — E1110 Type 2 diabetes mellitus with ketoacidosis without coma: Secondary | ICD-10-CM | POA: Diagnosis present

## 2021-02-23 DIAGNOSIS — I272 Pulmonary hypertension, unspecified: Secondary | ICD-10-CM | POA: Diagnosis present

## 2021-02-23 DIAGNOSIS — Z833 Family history of diabetes mellitus: Secondary | ICD-10-CM | POA: Diagnosis not present

## 2021-02-23 DIAGNOSIS — R197 Diarrhea, unspecified: Secondary | ICD-10-CM | POA: Diagnosis not present

## 2021-02-23 DIAGNOSIS — Z8673 Personal history of transient ischemic attack (TIA), and cerebral infarction without residual deficits: Secondary | ICD-10-CM

## 2021-02-23 DIAGNOSIS — D638 Anemia in other chronic diseases classified elsewhere: Secondary | ICD-10-CM | POA: Diagnosis present

## 2021-02-23 DIAGNOSIS — E875 Hyperkalemia: Secondary | ICD-10-CM | POA: Diagnosis present

## 2021-02-23 DIAGNOSIS — N179 Acute kidney failure, unspecified: Secondary | ICD-10-CM | POA: Diagnosis present

## 2021-02-23 DIAGNOSIS — E872 Acidosis, unspecified: Secondary | ICD-10-CM

## 2021-02-23 DIAGNOSIS — G9341 Metabolic encephalopathy: Secondary | ICD-10-CM | POA: Diagnosis present

## 2021-02-23 DIAGNOSIS — E1111 Type 2 diabetes mellitus with ketoacidosis with coma: Secondary | ICD-10-CM | POA: Diagnosis present

## 2021-02-23 DIAGNOSIS — D6489 Other specified anemias: Secondary | ICD-10-CM | POA: Diagnosis present

## 2021-02-23 DIAGNOSIS — G9340 Encephalopathy, unspecified: Secondary | ICD-10-CM | POA: Diagnosis not present

## 2021-02-23 DIAGNOSIS — Z4659 Encounter for fitting and adjustment of other gastrointestinal appliance and device: Secondary | ICD-10-CM

## 2021-02-23 DIAGNOSIS — A419 Sepsis, unspecified organism: Secondary | ICD-10-CM | POA: Diagnosis present

## 2021-02-23 DIAGNOSIS — E1165 Type 2 diabetes mellitus with hyperglycemia: Secondary | ICD-10-CM | POA: Diagnosis not present

## 2021-02-23 DIAGNOSIS — Z7902 Long term (current) use of antithrombotics/antiplatelets: Secondary | ICD-10-CM

## 2021-02-23 DIAGNOSIS — I110 Hypertensive heart disease with heart failure: Secondary | ICD-10-CM | POA: Diagnosis present

## 2021-02-23 DIAGNOSIS — Z681 Body mass index (BMI) 19 or less, adult: Secondary | ICD-10-CM | POA: Diagnosis not present

## 2021-02-23 DIAGNOSIS — E43 Unspecified severe protein-calorie malnutrition: Secondary | ICD-10-CM | POA: Diagnosis present

## 2021-02-23 DIAGNOSIS — L02214 Cutaneous abscess of groin: Secondary | ICD-10-CM | POA: Diagnosis present

## 2021-02-23 DIAGNOSIS — Z66 Do not resuscitate: Secondary | ICD-10-CM | POA: Diagnosis present

## 2021-02-23 DIAGNOSIS — Z20822 Contact with and (suspected) exposure to covid-19: Secondary | ICD-10-CM | POA: Diagnosis present

## 2021-02-23 DIAGNOSIS — R627 Adult failure to thrive: Secondary | ICD-10-CM | POA: Diagnosis present

## 2021-02-23 DIAGNOSIS — I428 Other cardiomyopathies: Secondary | ICD-10-CM | POA: Diagnosis present

## 2021-02-23 DIAGNOSIS — B9689 Other specified bacterial agents as the cause of diseases classified elsewhere: Secondary | ICD-10-CM | POA: Diagnosis present

## 2021-02-23 DIAGNOSIS — L739 Follicular disorder, unspecified: Secondary | ICD-10-CM | POA: Diagnosis present

## 2021-02-23 DIAGNOSIS — Z8249 Family history of ischemic heart disease and other diseases of the circulatory system: Secondary | ICD-10-CM | POA: Diagnosis not present

## 2021-02-23 DIAGNOSIS — Z87891 Personal history of nicotine dependence: Secondary | ICD-10-CM

## 2021-02-23 DIAGNOSIS — R54 Age-related physical debility: Secondary | ICD-10-CM | POA: Diagnosis present

## 2021-02-23 DIAGNOSIS — R4189 Other symptoms and signs involving cognitive functions and awareness: Secondary | ICD-10-CM | POA: Diagnosis present

## 2021-02-23 DIAGNOSIS — R4182 Altered mental status, unspecified: Secondary | ICD-10-CM | POA: Diagnosis not present

## 2021-02-23 DIAGNOSIS — J9811 Atelectasis: Secondary | ICD-10-CM | POA: Diagnosis present

## 2021-02-23 DIAGNOSIS — Z823 Family history of stroke: Secondary | ICD-10-CM | POA: Diagnosis not present

## 2021-02-23 DIAGNOSIS — I5042 Chronic combined systolic (congestive) and diastolic (congestive) heart failure: Secondary | ICD-10-CM | POA: Diagnosis present

## 2021-02-23 DIAGNOSIS — Z515 Encounter for palliative care: Secondary | ICD-10-CM | POA: Diagnosis not present

## 2021-02-23 DIAGNOSIS — Z794 Long term (current) use of insulin: Secondary | ICD-10-CM

## 2021-02-23 DIAGNOSIS — R509 Fever, unspecified: Secondary | ICD-10-CM

## 2021-02-23 DIAGNOSIS — Z7189 Other specified counseling: Secondary | ICD-10-CM | POA: Diagnosis not present

## 2021-02-23 LAB — COMPREHENSIVE METABOLIC PANEL
ALT: 9 U/L (ref 0–44)
AST: 15 U/L (ref 15–41)
Albumin: 3.7 g/dL (ref 3.5–5.0)
Alkaline Phosphatase: 61 U/L (ref 38–126)
BUN: 23 mg/dL — ABNORMAL HIGH (ref 6–20)
CO2: <7 mmol/L — ABNORMAL LOW (ref 22–32)
Calcium: 10.3 mg/dL (ref 8.9–10.3)
Chloride: 101 mmol/L (ref 98–111)
Creatinine, Ser: 2.05 mg/dL — ABNORMAL HIGH (ref 0.44–1.00)
GFR, Estimated: 27 mL/min — ABNORMAL LOW (ref >60)
Glucose, Bld: 486 mg/dL — ABNORMAL HIGH (ref 70–99)
Potassium: 4.6 mmol/L (ref 3.5–5.1)
Sodium: 137 mmol/L (ref 135–145)
Total Bilirubin: 1.8 mg/dL — ABNORMAL HIGH (ref 0.3–1.2)
Total Protein: 7.3 g/dL (ref 6.5–8.1)

## 2021-02-23 LAB — LACTIC ACID, PLASMA: Lactic Acid, Venous: 3.7 mmol/L (ref 0.5–1.9)

## 2021-02-23 LAB — CBC WITH DIFFERENTIAL/PLATELET
Abs Immature Granulocytes: 0.29 10*3/uL — ABNORMAL HIGH (ref 0.00–0.07)
Basophils Absolute: 0.1 10*3/uL (ref 0.0–0.1)
Basophils Relative: 1 %
Eosinophils Absolute: 0 10*3/uL (ref 0.0–0.5)
Eosinophils Relative: 0 %
HCT: 39.4 % (ref 36.0–46.0)
Hemoglobin: 11.4 g/dL — ABNORMAL LOW (ref 12.0–15.0)
Immature Granulocytes: 2 %
Lymphocytes Relative: 6 %
Lymphs Abs: 1.1 10*3/uL (ref 0.7–4.0)
MCH: 30.6 pg (ref 26.0–34.0)
MCHC: 28.9 g/dL — ABNORMAL LOW (ref 30.0–36.0)
MCV: 105.6 fL — ABNORMAL HIGH (ref 80.0–100.0)
Monocytes Absolute: 1.3 10*3/uL — ABNORMAL HIGH (ref 0.1–1.0)
Monocytes Relative: 7 %
Neutro Abs: 16.3 10*3/uL — ABNORMAL HIGH (ref 1.7–7.7)
Neutrophils Relative %: 84 %
Platelets: 329 10*3/uL (ref 150–400)
RBC: 3.73 MIL/uL — ABNORMAL LOW (ref 3.87–5.11)
RDW: 13.9 % (ref 11.5–15.5)
WBC: 19.1 10*3/uL — ABNORMAL HIGH (ref 4.0–10.5)
nRBC: 0 % (ref 0.0–0.2)

## 2021-02-23 LAB — I-STAT BETA HCG BLOOD, ED (MC, WL, AP ONLY): I-stat hCG, quantitative: <5 m[IU]/mL (ref <5)

## 2021-02-23 LAB — BASIC METABOLIC PANEL
BUN: 23 mg/dL — ABNORMAL HIGH (ref 6–20)
CO2: <7 mmol/L — ABNORMAL LOW (ref 22–32)
Calcium: 9.3 mg/dL (ref 8.9–10.3)
Chloride: 107 mmol/L (ref 98–111)
Creatinine, Ser: 1.8 mg/dL — ABNORMAL HIGH (ref 0.44–1.00)
GFR, Estimated: 32 mL/min — ABNORMAL LOW (ref >60)
Glucose, Bld: 436 mg/dL — ABNORMAL HIGH (ref 70–99)
Potassium: 4.3 mmol/L (ref 3.5–5.1)
Sodium: 138 mmol/L (ref 135–145)

## 2021-02-23 LAB — RESP PANEL BY RT-PCR (FLU A&B, COVID) ARPGX2
Influenza A by PCR: NEGATIVE
Influenza B by PCR: NEGATIVE
SARS Coronavirus 2 by RT PCR: NEGATIVE

## 2021-02-23 LAB — CBC
HCT: 35.3 % — ABNORMAL LOW (ref 36.0–46.0)
Hemoglobin: 10.5 g/dL — ABNORMAL LOW (ref 12.0–15.0)
MCH: 30.8 pg (ref 26.0–34.0)
MCHC: 29.7 g/dL — ABNORMAL LOW (ref 30.0–36.0)
MCV: 103.5 fL — ABNORMAL HIGH (ref 80.0–100.0)
Platelets: 236 10*3/uL (ref 150–400)
RBC: 3.41 MIL/uL — ABNORMAL LOW (ref 3.87–5.11)
RDW: 14 % (ref 11.5–15.5)
WBC: 18 10*3/uL — ABNORMAL HIGH (ref 4.0–10.5)
nRBC: 0 % (ref 0.0–0.2)

## 2021-02-23 LAB — BETA-HYDROXYBUTYRIC ACID
Beta-Hydroxybutyric Acid: >8 mmol/L — ABNORMAL HIGH (ref 0.05–0.27)
Beta-Hydroxybutyric Acid: >8 mmol/L — ABNORMAL HIGH (ref 0.05–0.27)

## 2021-02-23 LAB — CBG MONITORING, ED: Glucose-Capillary: 379 mg/dL — ABNORMAL HIGH (ref 70–99)

## 2021-02-23 LAB — GLUCOSE, CAPILLARY
Glucose-Capillary: 435 mg/dL — ABNORMAL HIGH (ref 70–99)
Glucose-Capillary: 397 mg/dL — ABNORMAL HIGH (ref 70–99)
Glucose-Capillary: 329 mg/dL — ABNORMAL HIGH (ref 70–99)

## 2021-02-23 IMAGING — CT CT HEAD W/O CM
4 series · 17 of 47 positions shown, 19 images · non-contrast
Comparison: [DATE]

CLINICAL DATA: Altered mental status for several hours

EXAM:
CT HEAD WITHOUT CONTRAST
TECHNIQUE: Contiguous axial images were obtained from the base of the skull
through the vertex without intravenous contrast.

[Series 3: head bone · axial · 0.38mm/px · z∈[-502,-446]mm · 4 of 83 slices shown]
[im 9/83  bone]
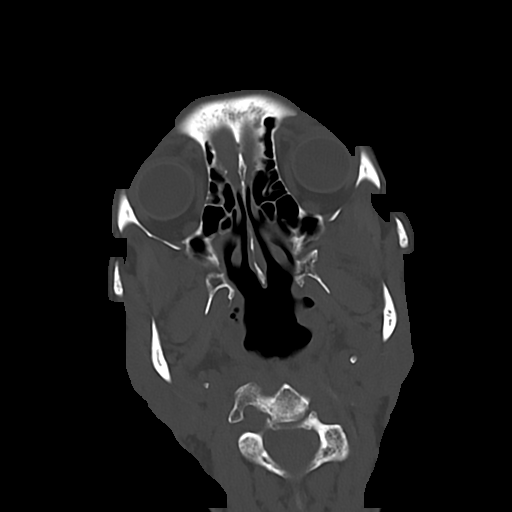
[im 17/83  bone]
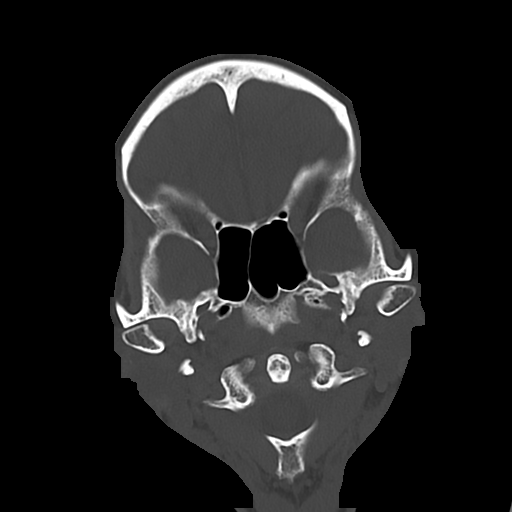
[im 25/83  bone]
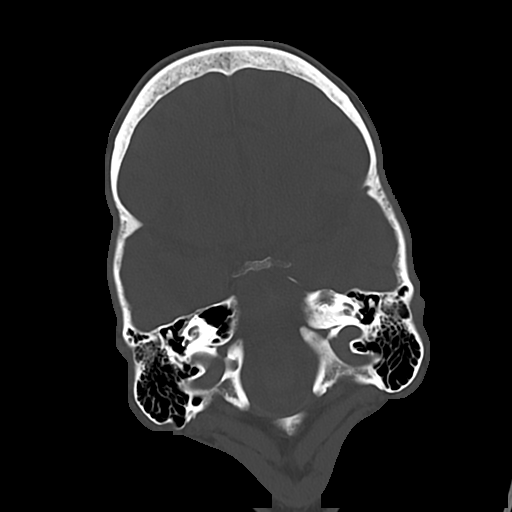
[im 37/83  bone]
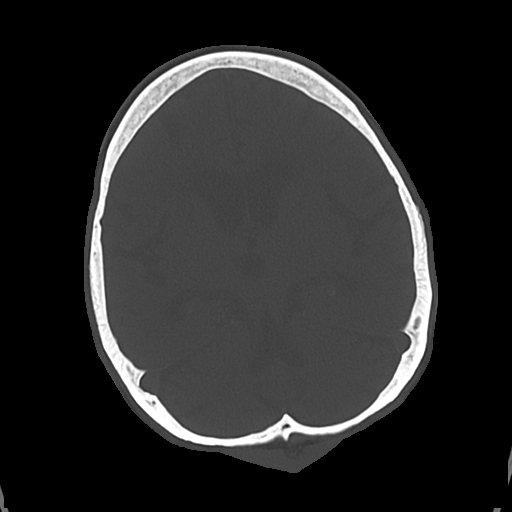

[Series 4: head wo · axial · 0.38mm/px · z∈[-498,-378]mm · 7 of 34 slices shown, 9 images]
[im 5/34  brain]
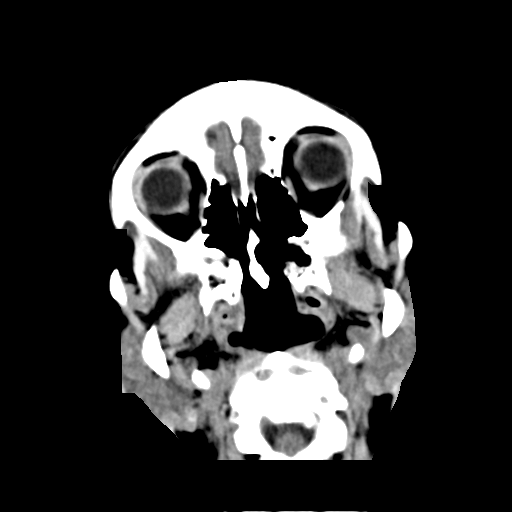
[im 5/34  bone]
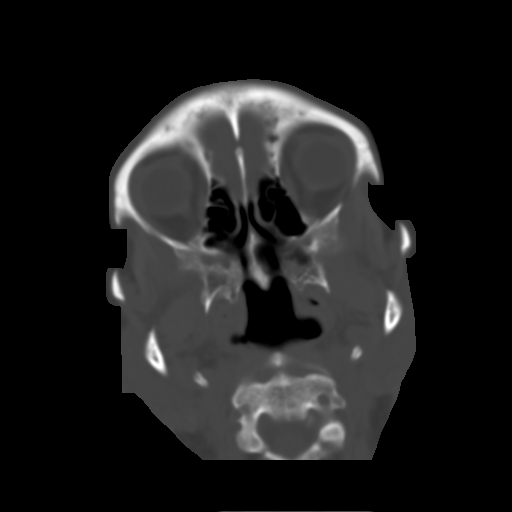
[im 9/34  brain]
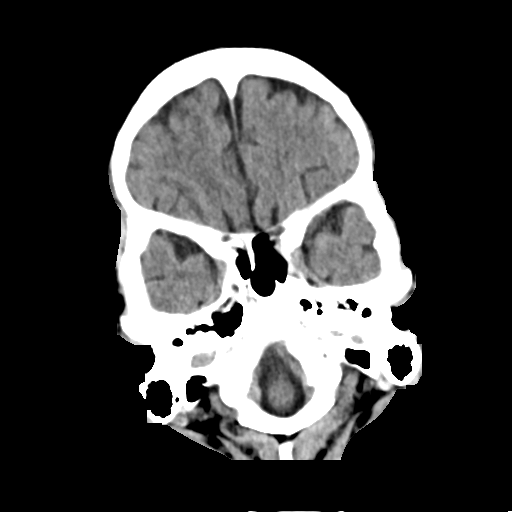
[im 13/34  brain]
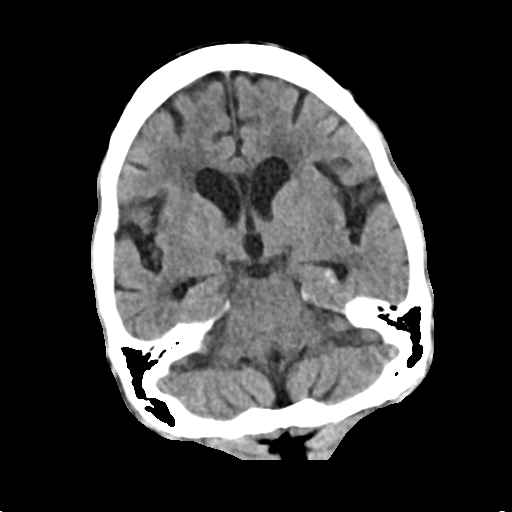
[im 17/34  brain]
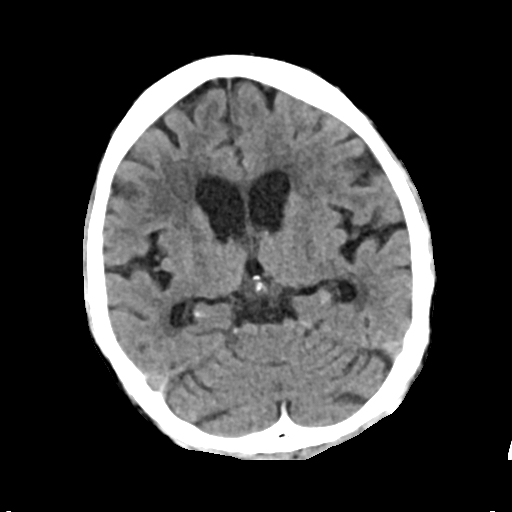
[im 21/34  brain]
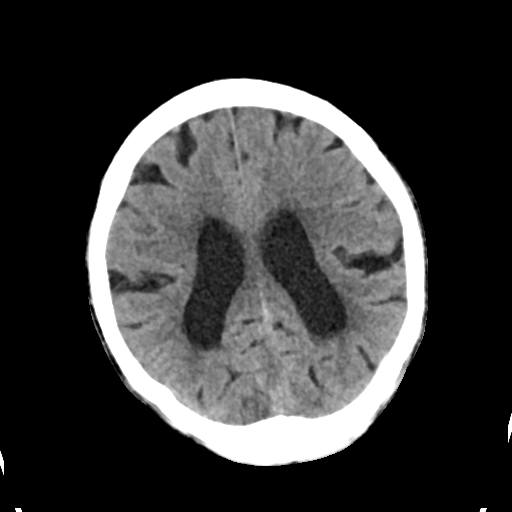
[im 21/34  bone]
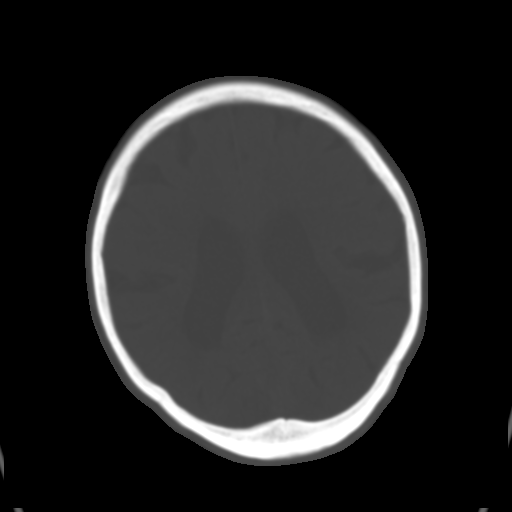
[im 25/34  brain]
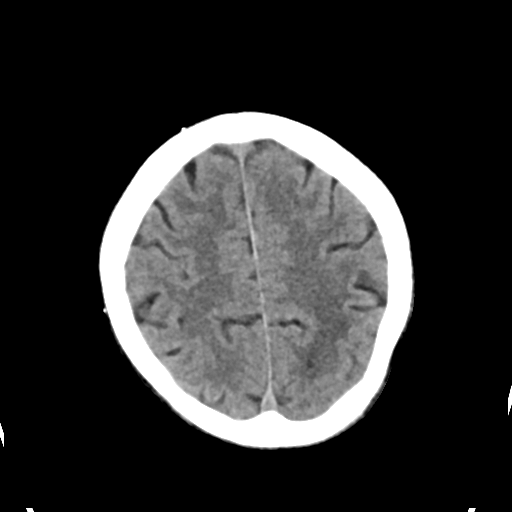
[im 29/34  brain]
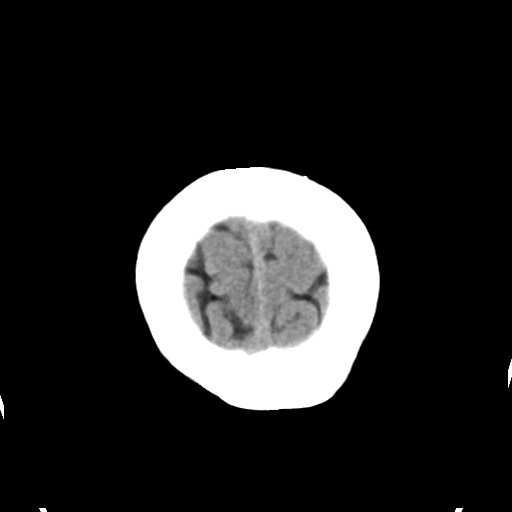

[Series 5: cor soft · coronal · 0.30mm/px · 3 of 62 slices shown]
[im 21/62  brain]
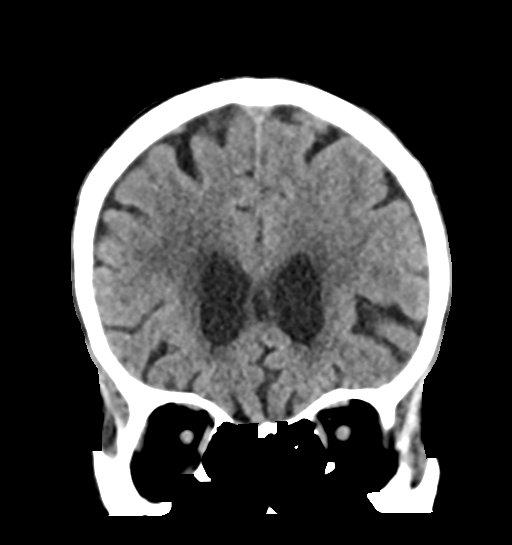
[im 28/62  brain]
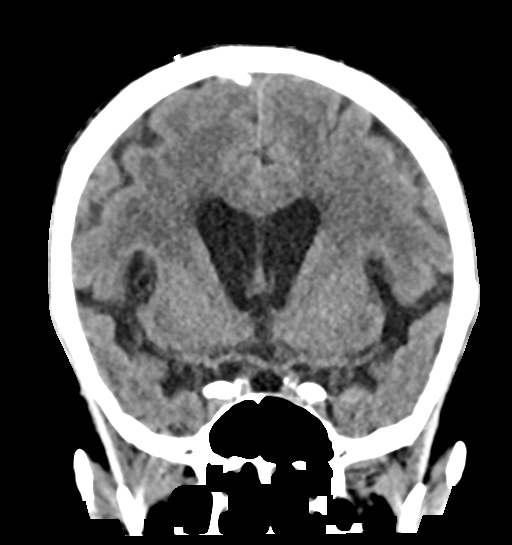
[im 34/62  brain]
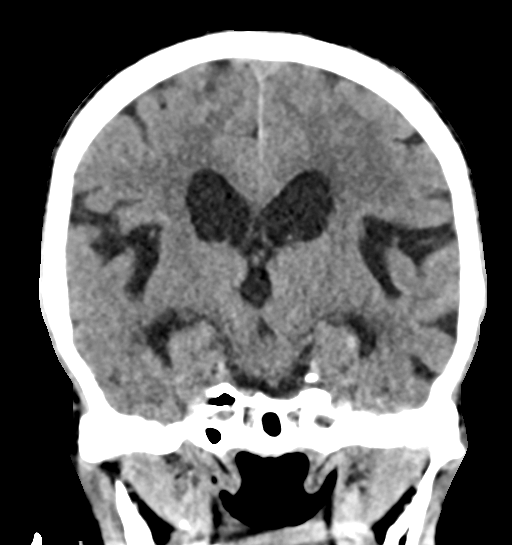

[Series 6: sag soft · sagittal · 0.32mm/px · 3 of 51 slices shown]
[im 17/51  brain]
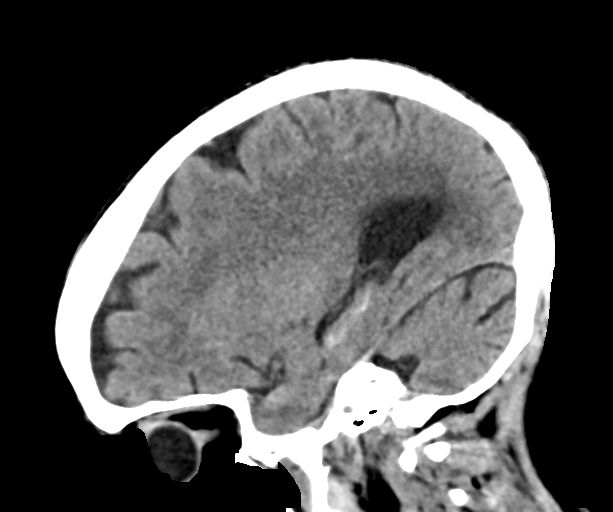
[im 26/51  brain]
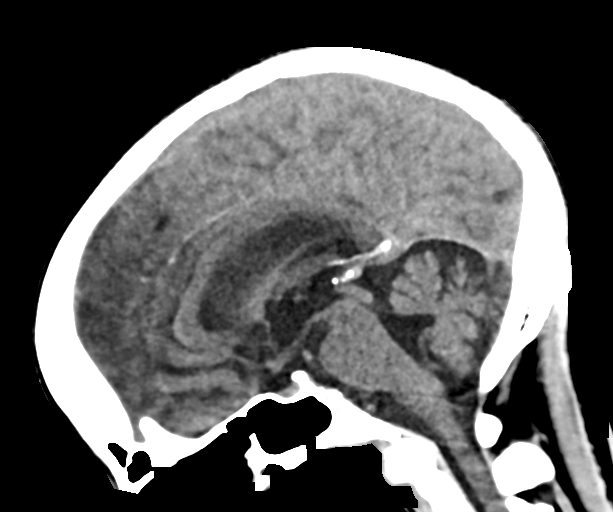
[im 34/51  brain]
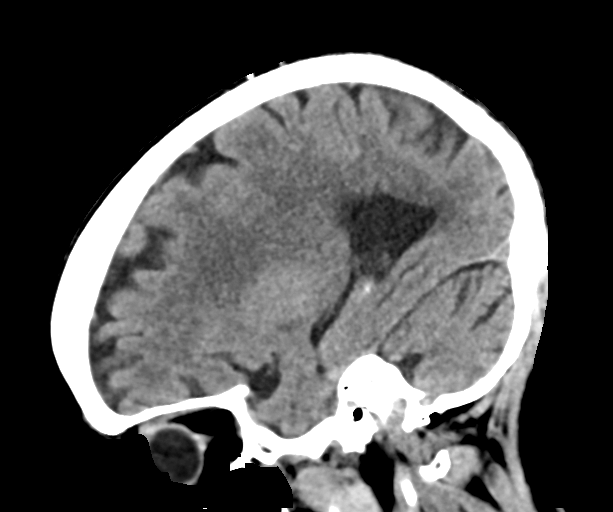

[17 of 47 positions shown; findings below may reference images not displayed]

FINDINGS: Brain: No evidence of acute infarction, hemorrhage, hydrocephalus,
extra-axial collection or mass lesion/mass effect. Mild atrophic
changes and chronic white matter ischemic changes are seen.

Vascular: No hyperdense vessel or unexpected calcification.

Skull: Normal. Negative for fracture or focal lesion.

Sinuses/Orbits: No acute finding.

Other: None.
IMPRESSION: Chronic atrophic and ischemic changes without acute abnormality.

## 2021-02-23 IMAGING — DX DG CHEST 1V PORT
1 series · 1 of 1 positions shown · non-contrast
Comparison: Chest x-ray dated [DATE]

CLINICAL DATA: Altered mental status

EXAM:
PORTABLE CHEST 1 VIEW

[chest]
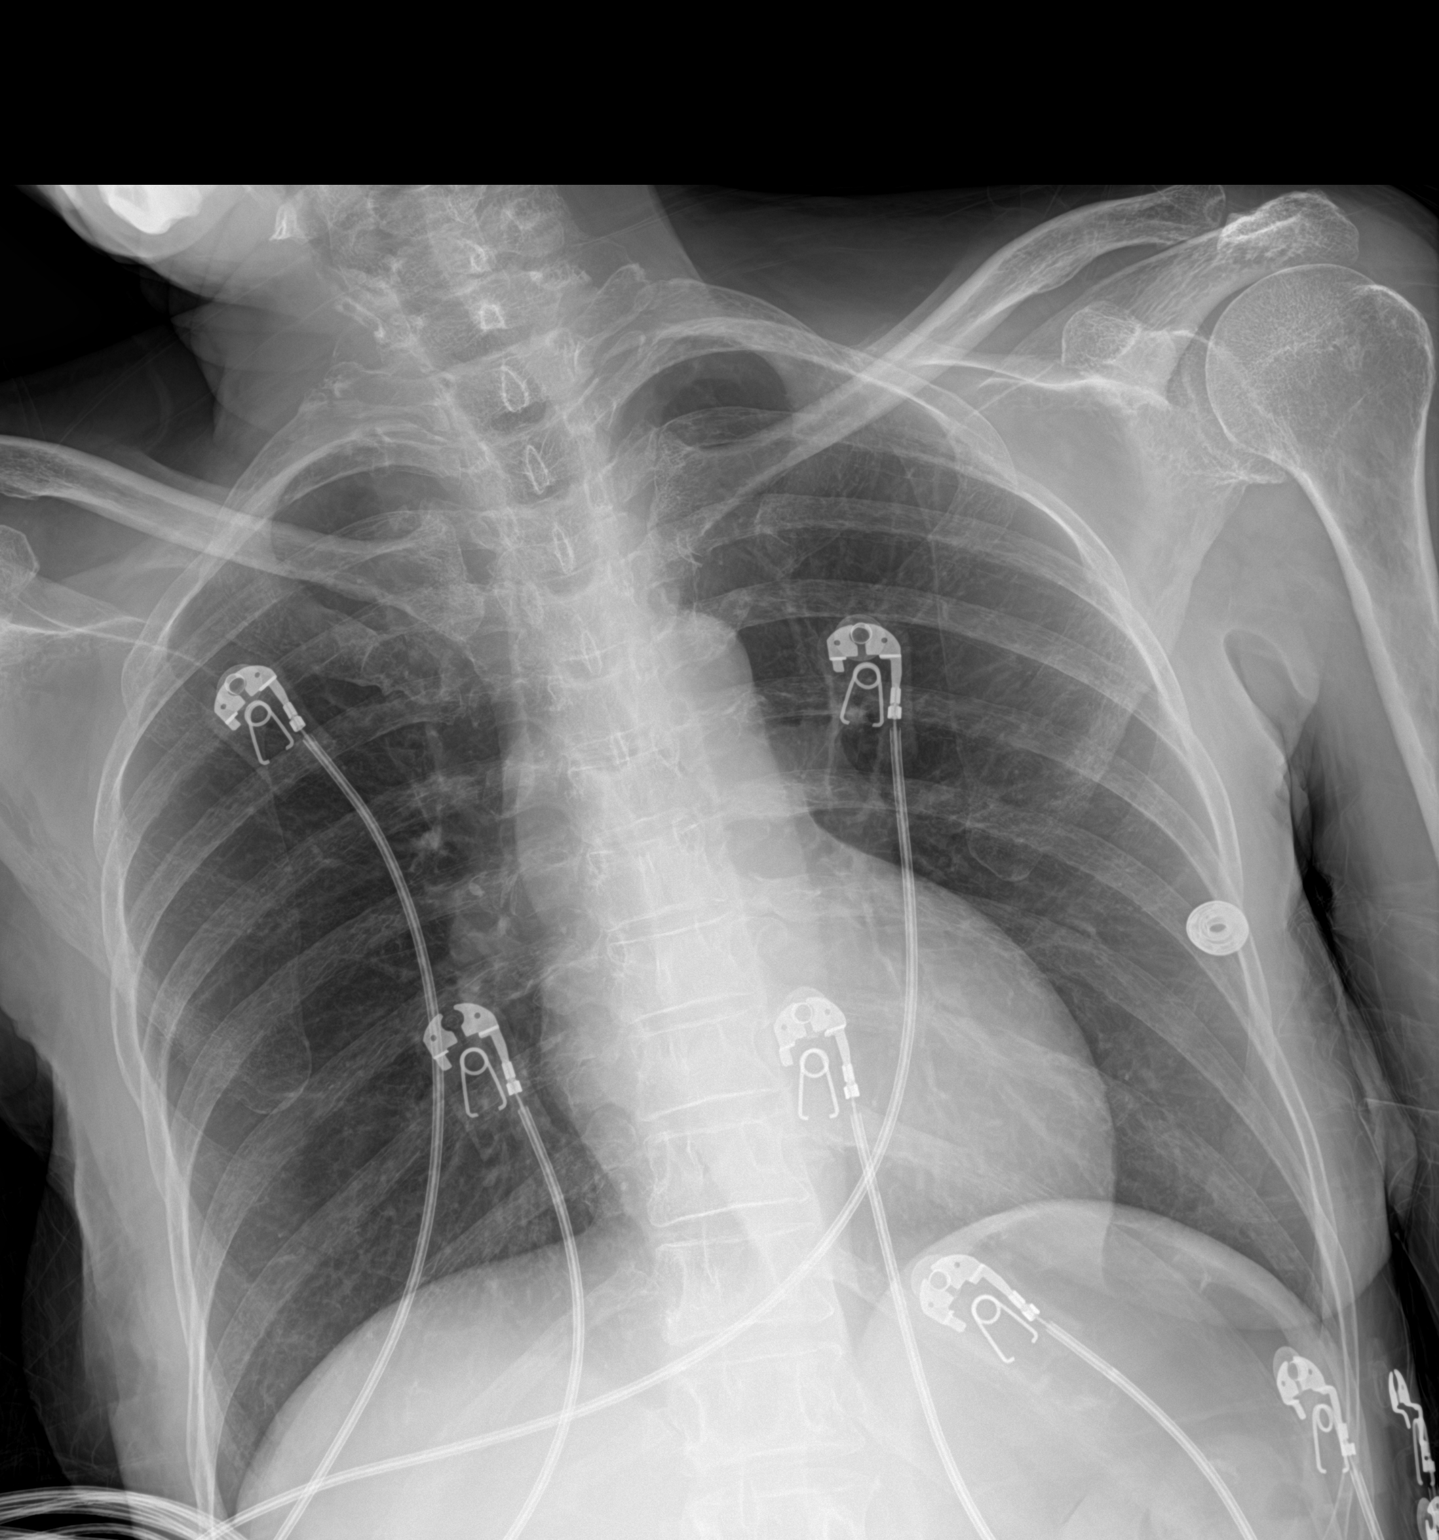

[1 of 1 positions shown; findings below may reference images not displayed]

FINDINGS: Cardiac and mediastinal contours are unchanged within normal limits.
Lungs are clear. No large pleural effusion or evidence of
pneumothorax.
IMPRESSION: No active disease.

## 2021-02-23 IMAGING — DX DG CHEST 1V PORT
1 series · 1 of 1 positions shown · non-contrast
Comparison: Chest x-ray from same day at [P0] hours.

CLINICAL DATA: Central line placement.

EXAM:
PORTABLE CHEST 1 VIEW

[chest]
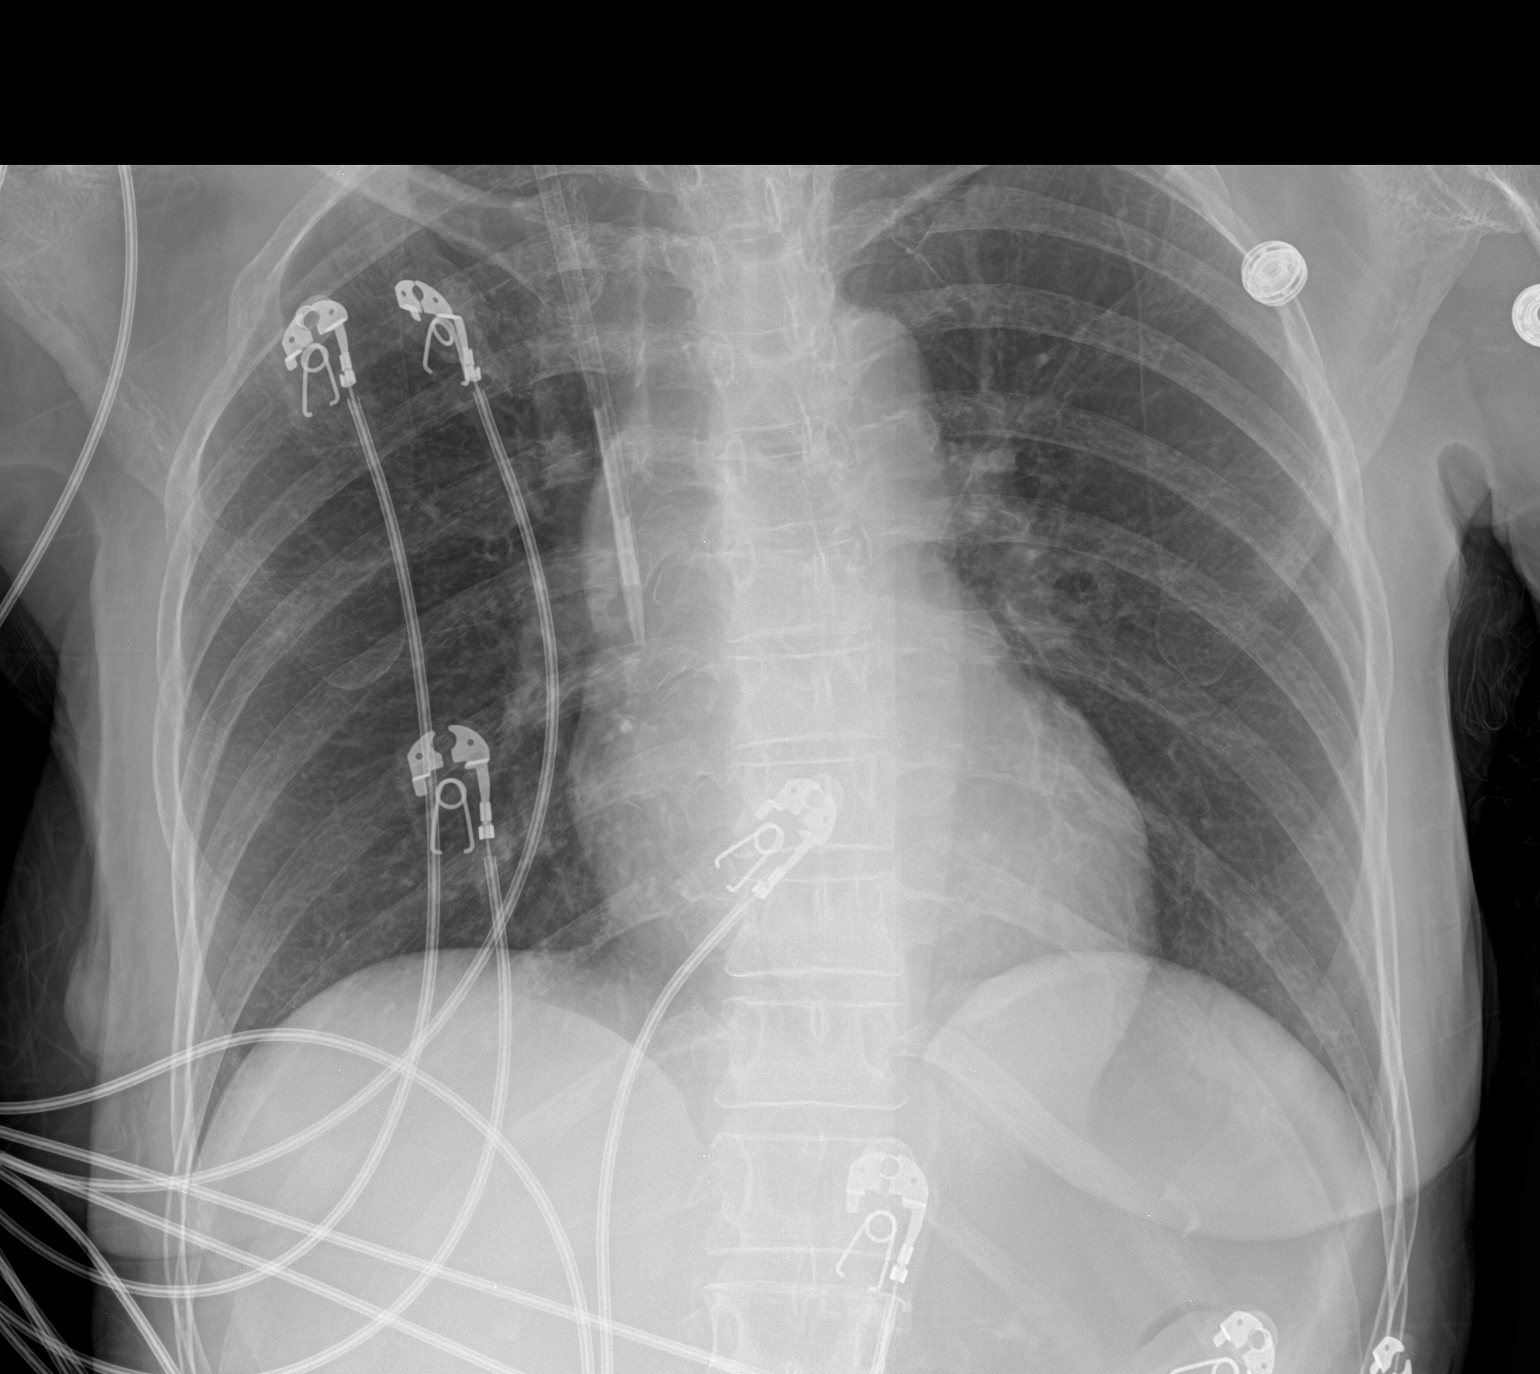

[1 of 1 positions shown; findings below may reference images not displayed]

FINDINGS: New right internal jugular dialysis catheter with tip near the
cavoatrial junction. The heart size and mediastinal contours are
within normal limits. Normal pulmonary vascularity. No focal
consolidation, pleural effusion, or pneumothorax. No acute osseous
abnormality.
IMPRESSION: 1. New right internal jugular dialysis catheter without
complication.

## 2021-02-23 MED ORDER — LACTATED RINGERS IV BOLUS
1000.0000 mL | Freq: Once | INTRAVENOUS | Status: AC
  Administered 2021-02-23: 1000 mL via INTRAVENOUS

## 2021-02-23 MED ORDER — POTASSIUM CHLORIDE 10 MEQ/100ML IV SOLN: 10.0000 meq | INTRAVENOUS | Status: AC

## 2021-02-23 MED ORDER — LACTATED RINGERS IV SOLN: INTRAVENOUS | Status: DC

## 2021-02-23 MED ORDER — DEXTROSE 50 % IV SOLN: 0.0000 mL | INTRAVENOUS | Status: DC | PRN

## 2021-02-23 MED ORDER — VANCOMYCIN HCL IN DEXTROSE 1-5 GM/200ML-% IV SOLN
1000.0000 mg | Freq: Once | INTRAVENOUS | Status: AC
  Administered 2021-02-23: 1000 mg via INTRAVENOUS
  Filled 2021-02-23 (×2): qty 200

## 2021-02-23 MED ORDER — SODIUM BICARBONATE 8.4 % IV SOLN
100.0000 meq | Freq: Once | INTRAVENOUS | Status: AC
  Administered 2021-02-23: 100 meq via INTRAVENOUS
  Filled 2021-02-23: qty 100

## 2021-02-23 MED ORDER — SODIUM CHLORIDE 0.9 % IV SOLN: 2.0000 g | Freq: Once | INTRAVENOUS | Status: DC

## 2021-02-23 MED ORDER — VANCOMYCIN VARIABLE DOSE PER UNSTABLE RENAL FUNCTION (PHARMACIST DOSING): Status: DC

## 2021-02-23 MED ORDER — ORAL CARE MOUTH RINSE
15.0000 mL | Freq: Two times a day (BID) | OROMUCOSAL | Status: DC
  Administered 2021-02-24 – 2021-03-04 (×13): 15 mL via OROMUCOSAL

## 2021-02-23 MED ORDER — SODIUM CHLORIDE 0.9 % IV BOLUS: 1000.0000 mL | Freq: Once | INTRAVENOUS | Status: DC

## 2021-02-23 MED ORDER — INSULIN REGULAR(HUMAN) IN NACL 100-0.9 UT/100ML-% IV SOLN: INTRAVENOUS | Status: DC

## 2021-02-23 MED ORDER — INSULIN REGULAR(HUMAN) IN NACL 100-0.9 UT/100ML-% IV SOLN
INTRAVENOUS | Status: DC
  Administered 2021-02-23: 4.8 [IU]/h via INTRAVENOUS
  Filled 2021-02-23 (×2): qty 100

## 2021-02-23 MED ORDER — SODIUM CHLORIDE 0.9 % IV SOLN
2.0000 g | INTRAVENOUS | Status: DC
  Administered 2021-02-23: 2 g via INTRAVENOUS
  Filled 2021-02-23: qty 2

## 2021-02-23 MED ORDER — DEXTROSE IN LACTATED RINGERS 5 % IV SOLN: INTRAVENOUS | Status: DC

## 2021-02-23 MED ORDER — HEPARIN SODIUM (PORCINE) 5000 UNIT/ML IJ SOLN
5000.0000 [IU] | Freq: Three times a day (TID) | INTRAMUSCULAR | Status: DC
  Administered 2021-02-23 – 2021-03-04 (×26): 5000 [IU] via SUBCUTANEOUS
  Filled 2021-02-23 (×26): qty 1

## 2021-02-23 NOTE — Progress Notes (Signed)
Arrived to room CVC being placed. Tomasita Morrow, RN VAST

## 2021-02-23 NOTE — ED Provider Notes (Addendum)
[facility] EMERGENCY DEPARTMENT Provider Note   CSN: [id number] Arrival date & time: 02/23/21  1543     History Chief Complaint  Patient presents with   Altered Mental Status   Hyperglycemia    Christine Benitez is a 60 y.o. female.  HPI This is a 60 year old patient with history ofCHF insulin, prior DKA, prior sepsis from pulmonary and urinary source, malnutrition, hypertension, pulmonary Hypertension who presents with altered mental status.  Last known normal of 8 AM.  Patient found now by family reportedly minimally responsive to painful stimuli and will not speak.  No additional history provided.  Patient unable to provide history.  Point-of-care glucose reportedly greater than 300 no interventions by EMS.    Past Medical History:  Diagnosis Date   Abnormal LFTs    a. 05/2014 -  ALT 52, alk phos 130.   Chronic systolic CHF (congestive heart failure) ([place])    a. Dx 05/2014 - echo at [facility] - EF 30-35%, moderate LVH, no rWMA, mild to moderate MR, moderate PH with PA pressure 60-26mHg, mild to moderate pericardial effusion. EF 30-35% by cath.   History of blood transfusion 1986   "related to C-section"   Hypertension    Insulin dependent diabetes mellitus    Iron deficiency anemia    NICM (nonischemic cardiomyopathy) ([place]    a. [place]/16:  minor nonobstructive CAD with mild irregularities in the LAD less than 10-20%, LCx with mild disease less than 20%, no significant disease in the RCA, EF 30-35%.   Pericardial effusion    a. [facility]   Pulmonary hypertension ([place]    [facility]. [facility]   Tobacco abuse     Patient Active Problem List   Diagnosis Date Noted   Encephalopathy acute    Metabolic acidosis    Nausea & vomiting 12/26/2020   Dehydration 12/26/2020   Chronic diastolic CHF (congestive heart failure) ([place] 12/26/2020   DKA (diabetic ketoacidosis) ([place] 12/25/2020   Severe sepsis  ([place] 11/25/2020   Sepsis due to pneumonia ([place]    Hyperkalemia 11/11/2020   Constipation 11/03/2020   Cachexia ([place] 11/03/2020   Malnutrition of moderate degree 11/03/2020   Hypoglycemia 11/02/2020   Proteus mirabilis infection 10/25/2020   Acute lower UTI 10/24/2020   Urinary tract infection 10/24/2020   Failure to thrive in adult 10/24/2020   Pericardial effusion    Iron deficiency anemia    Chronic systolic CHF (congestive heart failure) ([place]    Heart failure with reduced ejection fraction ([place] 07/18/2020   History of CVA (cerebrovascular accident) 07/18/2020   Nonischemic cardiomyopathy ([place] 07/18/2020   Type 2 diabetes mellitus with complication, with long-term current use of insulin ([place] 07/18/2020   Tobacco use 07/18/2020   Mixed hyperlipidemia 07/18/2020   Abnormal echocardiogram 07/18/2020   Resides in long term care facility 06/20/2020   Protein-calorie malnutrition, severe 03/24/2020   Pain    Altered mental status    DNR (do not resuscitate)    DNI (do not intubate)    Encounter for hospice care discussion    Goals of care, counseling/discussion    Palliative care by specialist    AKI (acute kidney injury) ([place]    Cardiogenic shock ([place] 03/14/2020   Acute urinary retention 03/13/2020   Chronic cholecystitis with calculus 03/12/2020   Cerebral thrombosis with cerebral infarction 03/12/2020   Abnormal LFTs    Hypertension    NICM (nonischemic cardiomyopathy) ([place]  Pulmonary hypertension ([place])    Tobacco abuse 06/10/2014   Diabetes mellitus ([place]) 06/10/2014   Family history of premature CAD 06/10/2014   History of blood transfusion 1986    Past Surgical History:  Procedure Laterality Date   APPENDECTOMY  ~ 2003   [place]   LEFT HEART CATHETERIZATION WITH CORONARY ANGIOGRAM N/A 06/11/2014   Procedure: LEFT HEART CATHETERIZATION WITH CORONARY ANGIOGRAM;  Surgeon: Peter M Martinique, MD;  Location: [facility] CATH LAB;  Service: Cardiovascular;   Laterality: N/A;     OB History   No obstetric history on file.     Family History  Problem Relation Age of Onset   Coronary artery disease Mother    Diabetes Mother    Lupus Mother    Stroke Mother    Lung cancer Father     Social History   Tobacco Use   Smoking status: Former    Packs/day: 0.50    Years: 22.00    Pack years: 11.00    Types: Cigarettes    Quit date: 03/26/2015    Years since quitting: 5.9   Smokeless tobacco: Never  Vaping Use   Vaping Use: Never used  Substance Use Topics   Alcohol use: No   Drug use: No    Home Medications Prior to Admission medications   Medication Sig Start Date End Date Taking? Authorizing Provider  acetaminophen (TYLENOL) 650 MG CR tablet Take 1,300 mg by mouth every 8 (eight) hours as needed for pain.   Yes [provider]  albuterol (VENTOLIN HFA) 108 (90 Base) MCG/ACT inhaler Inhale 2 puffs into the lungs every 6 (six) hours as needed for wheezing or shortness of breath. 10/28/20  Yes Allie Bossier, MD  clopidogrel (PLAVIX) 75 MG tablet Take 1 tablet (75 mg total) by mouth daily. 10/28/20 10/28/21 Yes Allie Bossier, MD  insulin aspart (NOVOLOG FLEXPEN) 100 UNIT/ML FlexPen Inject 5 Units into the skin 3 (three) times daily with meals. 12/03/20  Yes Antonieta Pert, MD  insulin glargine (LANTUS) 100 UNIT/ML Solostar Pen Inject 7 Units into the skin 2 (two) times daily. Patient taking differently: Inject 10 Units into the skin 2 (two) times daily. 12/03/20  Yes Antonieta Pert, MD  aspirin 81 MG EC tablet TAKE 1 TABLET (81 MG TOTAL) BY MOUTH DAILY. Patient not taking: Reported on 02/23/2021 09/10/14   Martinique, Peter M, MD  atorvastatin (LIPITOR) 40 MG tablet Take 1 tablet (40 mg total) by mouth every evening. Patient not taking: Reported on 02/23/2021 03/26/20   Sanjuan Dame, MD  dronabinol (MARINOL) 5 MG capsule Take 1 capsule (5 mg total) by mouth 2 (two) times daily before lunch and supper. Patient not taking: Reported on  02/23/2021 10/28/20   Allie Bossier, MD  feeding supplement (ENSURE ENLIVE / ENSURE PLUS) LIQD Take 237 mLs by mouth 2 (two) times daily between meals. Patient not taking: No sig reported 11/06/20   Hosie Poisson, MD  feeding supplement (ENSURE ENLIVE / ENSURE PLUS) LIQD Take 237 mLs by mouth 3 (three) times daily between meals. Patient not taking: No sig reported 11/18/20   Nicole Kindred A, DO  Insulin Pen Needle (PENTIPS) 32G X 4 MM MISC Use as directed 11/28/20   Kayleen Memos, DO  ivabradine (CORLANOR) 5 MG TABS tablet Take 0.5 tablets (2.5 mg total) by mouth 2 (two) times daily with a meal. Patient not taking: Reported on 02/23/2021 10/16/20   Lyda Jester M, PA-C  mirtazapine (REMERON SOL-TAB) 15  MG disintegrating tablet Take 0.5 tablets (7.5 mg total) by mouth at bedtime. Patient not taking: Reported on 02/23/2021 03/26/20   Sanjuan Dame, MD  Multiple Vitamin (MULTIVITAMIN WITH MINERALS) TABS tablet Take 1 tablet by mouth daily. Patient not taking: Reported on 02/23/2021 11/07/20   Hosie Poisson, MD  insulin aspart protamine - aspart (NOVOLOG 70/30 MIX) (70-30) 100 UNIT/ML FlexPen Inject 0.04 mLs (4 Units total) into the skin 2 (two) times daily with a meal. 11/28/20 12/01/20  Kayleen Memos, DO    Allergies    Patient has no known allergies.  Review of Systems   Review of Systems  Unable to perform ROS: Mental status change   Physical Exam Updated Vital Signs BP 126/73 (BP Location: Right Arm)   Pulse (!) 103   Temp (!) 97.5 F (36.4 C) (Axillary)   Resp (!) 26   Wt 37.1 kg   SpO2 100%   BMI 13.61 kg/m   Physical Exam Vitals and nursing note reviewed.  Constitutional:      General: She is not in acute distress.    Appearance: She is well-developed. She is ill-appearing.  HENT:     Head: Normocephalic and atraumatic.  Eyes:     Conjunctiva/sclera: Conjunctivae normal.  Cardiovascular:     Rate and Rhythm: Regular rhythm. Tachycardia present.     Heart sounds:  No murmur heard. Pulmonary:     Effort: Pulmonary effort is normal. No respiratory distress.     Breath sounds: Normal breath sounds.     Comments: Tachypnea  Abdominal:     General: Abdomen is flat.     Palpations: Abdomen is soft.  Musculoskeletal:     Cervical back: Neck supple.  Skin:    General: Skin is warm and dry.     Capillary Refill: Capillary refill takes more than 3 seconds.  Neurological:     GCS: GCS eye subscore is 4. GCS verbal subscore is 1. GCS motor subscore is 3.     Comments: Obtunded. Will not speak or follow commands. Minimally responsive to painful stimuli.     ED Results / Procedures / Treatments   Labs (all labs ordered are listed, but only abnormal results are displayed) Labs Reviewed  COMPREHENSIVE METABOLIC PANEL - Abnormal; Notable for the following components:      Result Value   CO2 <7 (*)    Glucose, Bld 486 (*)    BUN 23 (*)    Creatinine, Ser 2.05 (*)    Total Bilirubin 1.8 (*)    GFR, Estimated 27 (*)    All other components within normal limits  CBC WITH DIFFERENTIAL/PLATELET - Abnormal; Notable for the following components:   WBC 19.1 (*)    RBC 3.73 (*)    Hemoglobin 11.4 (*)    MCV 105.6 (*)    MCHC 28.9 (*)    Neutro Abs 16.3 (*)    Monocytes Absolute 1.3 (*)    Abs Immature Granulocytes 0.29 (*)    All other components within normal limits  BETA-HYDROXYBUTYRIC ACID - Abnormal; Notable for the following components:   Beta-Hydroxybutyric Acid >8.00 (*)    All other components within normal limits  LACTIC ACID, PLASMA - Abnormal; Notable for the following components:   Lactic Acid, Venous 3.7 (*)    All other components within normal limits  CBC - Abnormal; Notable for the following components:   WBC 18.0 (*)    RBC 3.41 (*)    Hemoglobin 10.5 (*)  HCT 35.3 (*)    MCV 103.5 (*)    MCHC 29.7 (*)    All other components within normal limits  GLUCOSE, CAPILLARY - Abnormal; Notable for the following components:    Glucose-Capillary 435 (*)    All other components within normal limits  GLUCOSE, CAPILLARY - Abnormal; Notable for the following components:   Glucose-Capillary 397 (*)    All other components within normal limits  GLUCOSE, CAPILLARY - Abnormal; Notable for the following components:   Glucose-Capillary 329 (*)    All other components within normal limits  CBG MONITORING, ED - Abnormal; Notable for the following components:   Glucose-Capillary 379 (*)    All other components within normal limits  RESP PANEL BY RT-PCR (FLU A&B, COVID) ARPGX2  CULTURE, BLOOD (ROUTINE X 2)  CULTURE, BLOOD (ROUTINE X 2)  URINALYSIS, ROUTINE W REFLEX MICROSCOPIC  LACTIC ACID, PLASMA  BASIC METABOLIC PANEL  BASIC METABOLIC PANEL  BASIC METABOLIC PANEL  BASIC METABOLIC PANEL  BETA-HYDROXYBUTYRIC ACID  BETA-HYDROXYBUTYRIC ACID  URINALYSIS, COMPLETE (UACMP) WITH MICROSCOPIC  I-STAT BETA HCG BLOOD, ED (MC, [facility], AP ONLY)  I-STAT VENOUS BLOOD GAS, ED  I-STAT VENOUS BLOOD GAS, ED  I-STAT ARTERIAL BLOOD GAS, ED    EKG None  Radiology CT HEAD WO CONTRAST (5MM)  Result Date: 02/23/2021 CLINICAL DATA:  Altered mental status for several hours EXAM: CT HEAD WITHOUT CONTRAST TECHNIQUE: Contiguous axial images were obtained from the base of the skull through the vertex without intravenous contrast. COMPARISON:  11/25/2020 FINDINGS: Brain: No evidence of acute infarction, hemorrhage, hydrocephalus, extra-axial collection or mass lesion/mass effect. Mild atrophic changes and chronic white matter ischemic changes are seen. Vascular: No hyperdense vessel or unexpected calcification. Skull: Normal. Negative for fracture or focal lesion. Sinuses/Orbits: No acute finding. Other: None. IMPRESSION: Chronic atrophic and ischemic changes without acute abnormality. Electronically Signed   By: Inez Catalina M.D.   On: 02/23/2021 19:51   DG CHEST PORT 1 VIEW  Result Date: 02/23/2021 CLINICAL DATA:  Central line placement. EXAM:  PORTABLE CHEST 1 VIEW COMPARISON:  Chest x-ray from same day at 1615 hours. FINDINGS: New right internal jugular dialysis catheter with tip near the cavoatrial junction. The heart size and mediastinal contours are within normal limits. Normal pulmonary vascularity. No focal consolidation, pleural effusion, or pneumothorax. No acute osseous abnormality. IMPRESSION: 1. New right internal jugular dialysis catheter without complication. Electronically Signed   By: Titus Dubin M.D.   On: 02/23/2021 17:22   DG CHEST PORT 1 VIEW  Result Date: 02/23/2021 CLINICAL DATA:  Altered mental status EXAM: PORTABLE CHEST 1 VIEW COMPARISON:  Chest x-ray dated December 26, 2020 FINDINGS: Cardiac and mediastinal contours are unchanged within normal limits. Lungs are clear. No large pleural effusion or evidence of pneumothorax. IMPRESSION: No active disease. Electronically Signed   By: Yetta Glassman M.D.   On: 02/23/2021 16:50    Procedures .Central Line  Date/Time: 02/23/2021 11:18 PM Performed by: Coralee Pesa, MD Authorized by: Teressa Lower, MD   Consent:    Consent obtained:  Emergent situation Universal protocol:    Patient identity confirmed:  Arm band Pre-procedure details:    Indication(s): central venous access and insufficient peripheral access     Hand hygiene: Hand hygiene performed prior to insertion     Sterile barrier technique: All elements of maximal sterile technique followed     Skin preparation:  Chlorhexidine   Skin preparation agent: Skin preparation agent completely dried prior to procedure   Sedation:  Sedation type:  None Anesthesia:    Anesthesia method:  Local infiltration   Local anesthetic:  Lidocaine 1% w/o epi Procedure details:    Location:  R internal jugular   Patient position:  Supine   Procedural supplies:  Triple lumen   Catheter size:  7 Fr   Landmarks identified: yes     Ultrasound guidance: yes     Ultrasound guidance timing: real time     Sterile  ultrasound techniques: Sterile gel and sterile probe covers were used     Number of attempts:  2   Successful placement: yes   Post-procedure details:    Post-procedure:  Dressing applied and line sutured   Assessment:  Blood return through all ports, no pneumothorax on x-ray, placement verified by x-ray and free fluid flow   Procedure completion:  Tolerated well, no immediate complications   Medications Ordered in ED Medications  vancomycin (VANCOCIN) IVPB 1000 mg/200 mL premix (has no administration in time range)  potassium chloride 10 mEq in 100 mL IVPB (10 mEq Intravenous Not Given 02/23/21 2211)  heparin injection 5,000 Units (5,000 Units Subcutaneous Given 02/23/21 2243)  insulin regular, human (MYXREDLIN) 100 units/ 100 mL infusion (4.8 Units/hr Intravenous New Bag/Given 02/23/21 2114)  lactated ringers infusion ( Intravenous New Bag/Given 02/23/21 2115)  dextrose 5 % in lactated ringers infusion (0 mLs Intravenous Hold 02/23/21 2208)  dextrose 50 % solution 0-50 mL (has no administration in time range)  ceFEPIme (MAXIPIME) 2 g in sodium chloride 0.9 % 100 mL IVPB (2 g Intravenous New Bag/Given 02/23/21 2235)  vancomycin variable dose per unstable renal function (pharmacist dosing) (has no administration in time range)  lactated ringers bolus 1,000 mL (0 mLs Intravenous Stopped 02/23/21 2024)  lactated ringers bolus 1,000 mL (0 mLs Intravenous Stopped 02/23/21 2024)  sodium bicarbonate injection 100 mEq (100 mEq Intravenous Given 02/23/21 2221)    ED Course  I have reviewed the triage vital signs and the nursing notes.  Pertinent labs & imaging results that were available during my care of the patient were reviewed by me and considered in my medical decision making (see chart for details).    MDM Rules/Calculators/A&P                          On arrival patient is tachycardic, tachypneic, and minimally responsive to painful stimuli.  Not following commands.  Does move all  extremities intermittently.  Point-of-care glucose elevated to 382, concerning for DKA especially given patient's history of same.   Other items on the differential for altered mental status include infection/Sepsis, seizure, stroke, electrolyte derangement, encephalopathy, uremia, dehydration, heart block, ACS, hemorrhage, ingestion, overdose, withdrawal, intoxication, polypharmacy.  No evidence of ingestion or intoxication and no obvious toxidromes. Broad work-up initiated including cultures, lactate, and blood gas.  Extensive difficulty obtaining access or blood peripherally, multiple attempts at ultrasound-guided IV attempted but failed.  Central line placed as above.  2 L of fluids given immediately.  Difficulties with i-STAT labs both in the ED and at central lab, unable to process full blood gas and also unable to calculate anion gap on CMP.  Preliminary blood gas values show pH is 6.95 and 7.  CMP with bicarb undetectably low.  These labs likely consistent with DKA although unable to evaluate anion gap.  Insulin drip initiated.  CBC with white count elevated at 19 but hgb is stable. Patient is not febrile but altered mentation, tachycardia, elevated white count  concerning for sepsis.  Urine is pending.  Chest x-ray without evidence of pneumonia or other cardiopulmonary abnormality.  Broad-spectrum antibiotics initiated. CT head without intracranial abnormality. EKG without evidence of acute ischemia. Low suspicion for ACS.   Intensivist consulted for admit. No change in mental status but HR improved after fluid resuscitation.    Final Clinical Impression(s) / ED Diagnoses Final diagnoses:  Unresponsive  Diabetic ketoacidosis with coma associated with type 2 diabetes mellitus [facility])    Rx / DC Orders ED Discharge Orders     None        Coralee Pesa, MD 02/23/21 2329    Coralee Pesa, MD 02/23/21 2332    Teressa Lower, MD 02/23/21 2356

## 2021-02-23 NOTE — Progress Notes (Addendum)
eLink Physician-Brief Progress Note Patient Name: Christine Benitez DOB: April 08, 1961 MRN: [id number]   Date of Service  02/23/2021  HPI/Events of Note  60/F with hx of DM and frequent admissions for DKA, presenting to the ED with altered mental status and hyperglycemia.  Pt again in DKA, started on insulin gtt.     BP 126/73, HR 103, RR 26, saturating well on RA.  eICU Interventions  DKA Possible sepsis Encephalopathy Malnutrition Lactic acidosis  Continue insulin gtt, IVFs. Continue empiric antibiotics pending cultures.  Check urinalysis. Follow BMP until anion gap resolves.  Heparin for DVT prophylaxis.     Intervention Category Evaluation Type: New Patient Evaluation  Larinda Buttery 02/23/2021, 9:06 PM

## 2021-02-23 NOTE — ED Notes (Addendum)
Updated daughter Glee Arvin at [phone].  I provided her the Room number [phone] and phone number to the unit.  She states pt is normally A&O 4.

## 2021-02-23 NOTE — ED Triage Notes (Signed)
[name] [facility] from home where she lives with daughter for AMS/Hyperglycemia.  PT normally interacts well with family and was last seen normal at 8am.  Pt discovered to be AMS around 1442 with a high glucose at home.   EMS reports CBG of 485, BP 140 (palp), HR 130, tachypnic 30-40 bpm and capnography of 8.  EMS was not able to establish a line.

## 2021-02-23 NOTE — H&P (Signed)
NAME:  [name], MRN:  [id number], DOB:  [birth year], LOS: 0 ADMISSION DATE:  02/23/2021, CONSULTATION DATE:  02/23/21 REFERRING MD:  Vira Agar CHIEF COMPLAINT:  AMS   History of Present Illness:  Christine Benitez is a 60 y.o. female who has a PMH as outlined below including but not limited to DM with frequent admissions for DKA.  She presented to [facility] ED 10/17 with AMS and hyperglycemia.  Last seen normal around 0800 that morning then found with AMS around 1442 that afternoon. EMS was called and CBG was 485.  In ED, she remained altered.  CO2 <7, glucose 486, Lactic acid 3.7, beta hydroxybutyric acid >8.  She was given 2L LR bolus and insulin infusion was ordered.  Due to her AMS and profound acidosis, PCCM asked to admit to ICU.  Per daughter, no recent illness.  Unsure if has taken all meds or not.  She does confirm DNR status and confirms that pt is seen by palliative care as outpatient.  Pertinent  Medical History:  has Tobacco abuse; Diabetes mellitus (HCC); Family history of premature CAD; Abnormal LFTs; Hypertension; NICM (nonischemic cardiomyopathy) (HCC); Pulmonary hypertension (HCC); Chronic cholecystitis with calculus; Cerebral thrombosis with cerebral infarction; Acute urinary retention; Cardiogenic shock (HCC); AKI (acute kidney injury) (HCC); Goals of care, counseling/discussion; Palliative care by specialist; Altered mental status; DNR (do not resuscitate); DNI (do not intubate); Encounter for hospice care discussion; Pain; Protein-calorie malnutrition, severe; Heart failure with reduced ejection fraction (HCC); History of CVA (cerebrovascular accident); Nonischemic cardiomyopathy (HCC); Type 2 diabetes mellitus with complication, with long-term current use of insulin (HCC); Tobacco use; Mixed hyperlipidemia; Abnormal echocardiogram; Pericardial effusion; Iron deficiency anemia; History of blood transfusion; Chronic systolic CHF (congestive heart failure) (HCC); Resides in long term  care facility; Acute lower UTI; Urinary tract infection; Failure to thrive in adult; Proteus mirabilis infection; Hypoglycemia; Constipation; Cachexia (HCC); Malnutrition of moderate degree; Hyperkalemia; Severe sepsis (HCC); Sepsis due to pneumonia [facility]); DKA (diabetic ketoacidosis) (HCC); Nausea & vomiting; Dehydration; and Chronic diastolic CHF (congestive heart failure) (HCC) on their problem list.  Significant Hospital Events: Including procedures, antibiotic start and stop dates in addition to other pertinent events   10/17 > admit.  Interim History / Subjective:  Opens eyes but does not communicate or follow any commands.  Objective:  Blood pressure 133/71, pulse (!) 107, temperature (!) 97.4 F (36.3 C), temperature source Oral, resp. rate (!) 32, SpO2 100 %.       No intake or output data in the 24 hours ending 02/23/21 2001 There were no vitals filed for this visit.  Examination: General: Adult female, chronically ill appearing, in NAD. Neuro: Opens eyes but does not follow commands and does not communicate or interact. HEENT: [place]/AT. Sclerae anicteric. MM dry. Cardiovascular: Tachy, regular, no M/R/G.  Lungs: Respirations even and unlabored.  CTA bilaterally, No W/R/R. Abdomen: BS x 4, soft, NT/ND.  Musculoskeletal: No gross deformities, no edema.  Skin: Intact, warm, no rashes.  Labs/imaging personally reviewed:  CT head 10/17 > chronic changes, no acute process.  Assessment & Plan:   DKA - unclear what provoked this at this point. Hx DM - per notes, has had some problems with compliance and correct regimen etc. - Continue insulin and fluids per DKA protocol. - 2 amps HCO3 now. - Needs ongoing DM education. - Hold home Novolog, lantus.  Possible sepsis - no clear source at this point but can't definitively rule out. - Continue empiric abx for now. - Follow  cultures.  AMS - 2/2 above. - Supportive care. - Hold sedating meds.  AoCKD - 2/2 above. - Continue  fluids / hydration. - Follow BMP.  Hx HTN, dCHF, NICM (echo from June 2022 with EF 50-55%, G1DD). - Hold home Clopidogrel, ASA and can resume once mental status improves.  Protein Calorie Malnutrition. - Nutrition consult once mental status improves and can safely take PO.  Hx CVA. - Supportive care.  Goals of care - palliative care following as outpatient. - Continue current plan / follow up.  Best practice (evaluated daily):  Diet/type: NPO DVT prophylaxis: prophylactic heparin  GI prophylaxis: N/A Lines: Central line Foley:  N/A Code Status:  DNR, confirmed with daughter Christine Benitez [phone]) Last date of multidisciplinary goals of care discussion: None yet.  Labs   CBC: Recent Labs  Lab 02/23/21 1547  WBC 19.1*  NEUTROABS 16.3*  HGB 11.4*  HCT 39.4  MCV 105.6*  PLT 329    Basic Metabolic Panel: Recent Labs  Lab 02/23/21 1547  NA 137  K 4.6  CL 101  CO2 <7*  GLUCOSE 486*  BUN 23*  CREATININE 2.05*  CALCIUM 10.3   GFR: CrCl cannot be calculated (Unknown ideal weight.). Recent Labs  Lab 02/23/21 1547 02/23/21 1555  WBC 19.1*  --   LATICACIDVEN  --  3.7*    Liver Function Tests: Recent Labs  Lab 02/23/21 1547  AST 15  ALT 9  ALKPHOS 61  BILITOT 1.8*  PROT 7.3  ALBUMIN 3.7   No results for input(s): LIPASE, AMYLASE in the last 168 hours. No results for input(s): AMMONIA in the last 168 hours.  ABG    Component Value Date/Time   PHART 7.342 (L) 11/25/2020 0153   PCO2ART 39.5 11/25/2020 0153   PO2ART 109 (H) 11/25/2020 0153   HCO3 18.8 (L) 12/25/2020 2057   TCO2 20 (L) 12/25/2020 2057   ACIDBASEDEF 8.0 (H) 12/25/2020 2057   O2SAT 98.0 12/25/2020 2057     Coagulation Profile: No results for input(s): INR, PROTIME in the last 168 hours.  Cardiac Enzymes: No results for input(s): CKTOTAL, CKMB, CKMBINDEX, TROPONINI in the last 168 hours.  HbA1C: Hgb A1c MFr Bld  Date/Time Value Ref Range Status  12/26/2020 09:32 AM 10.1  (H) 4.8 - 5.6 % Final    Comment:    (NOTE) Pre diabetes:          5.7%-6.4%  Diabetes:              >6.4%  Glycemic control for   <7.0% adults with diabetes   10/24/2020 10:51 PM 7.7 (H) 4.8 - 5.6 % Final    Comment:    (NOTE)         Prediabetes: 5.7 - 6.4         Diabetes: >6.4         Glycemic control for adults with diabetes: <7.0     CBG: Recent Labs  Lab 02/23/21 1549  GLUCAP 379*    Review of Systems:   Unable to obtain as pt is encephalopathic.  Past Medical History:  She,  has a past medical history of Abnormal LFTs, Chronic systolic CHF (congestive heart failure) (HCC), History of blood transfusion (1986), Hypertension, Insulin dependent diabetes mellitus, Iron deficiency anemia, NICM (nonischemic cardiomyopathy) (HCC), Pericardial effusion, Pulmonary hypertension (HCC), and Tobacco abuse.   Surgical History:   Past Surgical History:  Procedure Laterality Date   APPENDECTOMY  ~ 2003   CESAREAN SECTION  1986   LEFT  HEART CATHETERIZATION WITH CORONARY ANGIOGRAM N/A 06/11/2014   Procedure: LEFT HEART CATHETERIZATION WITH CORONARY ANGIOGRAM;  Surgeon: Peter M Swaziland, MD;  Location: [facility] CATH LAB;  Service: Cardiovascular;  Laterality: N/A;     Social History:   reports that she quit smoking about 5 years ago. Her smoking use included cigarettes. She has a 11.00 pack-year smoking history. She has never used smokeless tobacco. She reports that she does not drink alcohol and does not use drugs.   Family History:  Her family history includes Coronary artery disease in her mother; Diabetes in her mother; Lung cancer in her father; Lupus in her mother; Stroke in her mother.   Allergies No Known Allergies   Home Medications  Prior to Admission medications   Medication Sig Start Date End Date Taking? Authorizing Provider  acetaminophen (TYLENOL) 650 MG CR tablet Take 1,300 mg by mouth every 8 (eight) hours as needed for pain.   Yes [provider]  albuterol  (VENTOLIN HFA) 108 (90 Base) MCG/ACT inhaler Inhale 2 puffs into the lungs every 6 (six) hours as needed for wheezing or shortness of breath. 10/28/20  Yes Drema Dallas, MD  clopidogrel (PLAVIX) 75 MG tablet Take 1 tablet (75 mg total) by mouth daily. 10/28/20 10/28/21 Yes Drema Dallas, MD  insulin aspart (NOVOLOG FLEXPEN) 100 UNIT/ML FlexPen Inject 5 Units into the skin 3 (three) times daily with meals. 12/03/20  Yes Lanae Boast, MD  insulin glargine (LANTUS) 100 UNIT/ML Solostar Pen Inject 7 Units into the skin 2 (two) times daily. Patient taking differently: Inject 10 Units into the skin 2 (two) times daily. 12/03/20  Yes Lanae Boast, MD  aspirin 81 MG EC tablet TAKE 1 TABLET (81 MG TOTAL) BY MOUTH DAILY. Patient not taking: Reported on 02/23/2021 09/10/14   [place], Peter M, MD  atorvastatin (LIPITOR) 40 MG tablet Take 1 tablet (40 mg total) by mouth every evening. Patient not taking: Reported on 02/23/2021 03/26/20   Evlyn Kanner, MD  dronabinol (MARINOL) 5 MG capsule Take 1 capsule (5 mg total) by mouth 2 (two) times daily before lunch and supper. Patient not taking: Reported on 02/23/2021 10/28/20   Drema Dallas, MD  feeding supplement (ENSURE ENLIVE / ENSURE PLUS) LIQD Take 237 mLs by mouth 2 (two) times daily between meals. Patient not taking: No sig reported 11/06/20   Kathlen Mody, MD  feeding supplement (ENSURE ENLIVE / ENSURE PLUS) LIQD Take 237 mLs by mouth 3 (three) times daily between meals. Patient not taking: No sig reported 11/18/20   Esaw Grandchild A, DO  Insulin Pen Needle (PENTIPS) 32G X 4 MM MISC Use as directed 11/28/20   Darlin Drop, DO  ivabradine (CORLANOR) 5 MG TABS tablet Take 0.5 tablets (2.5 mg total) by mouth 2 (two) times daily with a meal. Patient not taking: Reported on 02/23/2021 10/16/20   Robbie Lis M, PA-C  mirtazapine (REMERON SOL-TAB) 15 MG disintegrating tablet Take 0.5 tablets (7.5 mg total) by mouth at bedtime. Patient not taking: Reported on  02/23/2021 03/26/20   Evlyn Kanner, MD  Multiple Vitamin (MULTIVITAMIN WITH MINERALS) TABS tablet Take 1 tablet by mouth daily. Patient not taking: Reported on 02/23/2021 11/07/20   Kathlen Mody, MD  insulin aspart protamine - aspart (NOVOLOG 70/30 MIX) (70-30) 100 UNIT/ML FlexPen Inject 0.04 mLs (4 Units total) into the skin 2 (two) times daily with a meal. 11/28/20 12/01/20  Darlin Drop, DO     Critical care time: 40 min.  Rutherford Guys, PA - [facility] For pager details, please see AMION or use Epic chat  After 1900, please call [facility] for cross coverage needs 02/23/2021, 8:01 PM

## 2021-02-23 NOTE — Progress Notes (Signed)
Pharmacy Antibiotic Note  Christine Benitez is a 60 y.o. female admitted on 02/23/2021 with sepsis.  Pharmacy has been consulted for cefepime and vancomycin dosing.  Patient with a history of CHF (EF 50 to 55% on 10/25/2020 TTE, improved from prior), type 1 diabetes, nonischemic cardiomyopathy, history CVA, malnutrition, HTN, and pulmonary htn. Patient presenting with AMS and hyperglycemia.  SCr 2.05 which is above baseline WBC 19.1; LA 3.7; afebrile  Plan: Cefepime 2g q24hr Vancomycin 1000mg  - subsequent dosing to be determined by drug levels unless renal function more stable Trend WBC, fever, renal function F/u cultures and clinical course De-escalate when able     Temp (24hrs), Avg:97.4 F (36.3 C), Min:97.4 F (36.3 C), Max:97.4 F (36.3 C)  Recent Labs  Lab 02/23/21 1547 02/23/21 1555  WBC 19.1*  --   CREATININE 2.05*  --   LATICACIDVEN  --  3.7*    CrCl cannot be calculated (Unknown ideal weight.).    No Known Allergies  Antimicrobials this admission: cefepime 10/17 >>  vancomycin 10/17 >>   Microbiology results: Pending  Thank you for allowing pharmacy to be a part of this patient's care.  11/17 02/23/2021 6:49 PM

## 2021-02-23 NOTE — ED Notes (Signed)
Christine Benitez daughter [phone] requesting an update

## 2021-02-23 NOTE — ED Provider Notes (Signed)
.  Critical Care Performed by: Glendora Score, MD Authorized by: Glendora Score, MD   Critical care provider statement:    Critical care time (minutes):  80   Critical care was necessary to treat or prevent imminent or life-threatening deterioration of the following conditions:  Metabolic crisis and dehydration   Critical care was time spent personally by me on the following activities:  Blood draw for specimens, ordering and performing treatments and interventions, ordering and review of laboratory studies, pulse oximetry, ordering and review of radiographic studies, discussions with consultants, examination of patient, review of old charts and vascular access procedures    Glendora Score, MD 02/23/21 2317

## 2021-02-24 ENCOUNTER — Inpatient Hospital Stay (HOSPITAL_COMMUNITY): Payer: Medicaid Other

## 2021-02-24 DIAGNOSIS — N179 Acute kidney failure, unspecified: Secondary | ICD-10-CM

## 2021-02-24 LAB — BETA-HYDROXYBUTYRIC ACID
Beta-Hydroxybutyric Acid: >8 mmol/L — ABNORMAL HIGH (ref 0.05–0.27)
Beta-Hydroxybutyric Acid: 1.74 mmol/L — ABNORMAL HIGH (ref 0.05–0.27)
Beta-Hydroxybutyric Acid: 0.64 mmol/L — ABNORMAL HIGH (ref 0.05–0.27)
Beta-Hydroxybutyric Acid: 0.63 mmol/L — ABNORMAL HIGH (ref 0.05–0.27)
Beta-Hydroxybutyric Acid: 0.25 mmol/L (ref 0.05–0.27)
Beta-Hydroxybutyric Acid: 0.22 mmol/L (ref 0.05–0.27)
Beta-Hydroxybutyric Acid: 0.13 mmol/L (ref 0.05–0.27)

## 2021-02-24 LAB — URINALYSIS, ROUTINE W REFLEX MICROSCOPIC
Bilirubin Urine: NEGATIVE
Glucose, UA: >=500 mg/dL — AB
Ketones, ur: 80 mg/dL — AB
Leukocytes,Ua: NEGATIVE
Nitrite: NEGATIVE
Protein, ur: NEGATIVE mg/dL
Specific Gravity, Urine: 1.012 (ref 1.005–1.030)
pH: 5 (ref 5.0–8.0)

## 2021-02-24 LAB — BASIC METABOLIC PANEL
Anion gap: 20 — ABNORMAL HIGH (ref 5–15)
Anion gap: 13 (ref 5–15)
Anion gap: 8 (ref 5–15)
Anion gap: 7 (ref 5–15)
Anion gap: 8 (ref 5–15)
Anion gap: 10 (ref 5–15)
BUN: 22 mg/dL — ABNORMAL HIGH (ref 6–20)
BUN: 17 mg/dL (ref 6–20)
BUN: 12 mg/dL (ref 6–20)
BUN: 11 mg/dL (ref 6–20)
BUN: 11 mg/dL (ref 6–20)
BUN: 7 mg/dL (ref 6–20)
CO2: 12 mmol/L — ABNORMAL LOW (ref 22–32)
CO2: 20 mmol/L — ABNORMAL LOW (ref 22–32)
CO2: 22 mmol/L (ref 22–32)
CO2: 24 mmol/L (ref 22–32)
CO2: 24 mmol/L (ref 22–32)
CO2: 24 mmol/L (ref 22–32)
Calcium: 8.9 mg/dL (ref 8.9–10.3)
Calcium: 9.1 mg/dL (ref 8.9–10.3)
Calcium: 8.8 mg/dL — ABNORMAL LOW (ref 8.9–10.3)
Calcium: 8.7 mg/dL — ABNORMAL LOW (ref 8.9–10.3)
Calcium: 8.8 mg/dL — ABNORMAL LOW (ref 8.9–10.3)
Calcium: 8.8 mg/dL — ABNORMAL LOW (ref 8.9–10.3)
Chloride: 108 mmol/L (ref 98–111)
Chloride: 109 mmol/L (ref 98–111)
Chloride: 109 mmol/L (ref 98–111)
Chloride: 107 mmol/L (ref 98–111)
Chloride: 104 mmol/L (ref 98–111)
Chloride: 103 mmol/L (ref 98–111)
Creatinine, Ser: 1.95 mg/dL — ABNORMAL HIGH (ref 0.44–1.00)
Creatinine, Ser: 1.55 mg/dL — ABNORMAL HIGH (ref 0.44–1.00)
Creatinine, Ser: 1.14 mg/dL — ABNORMAL HIGH (ref 0.44–1.00)
Creatinine, Ser: 0.97 mg/dL (ref 0.44–1.00)
Creatinine, Ser: 0.93 mg/dL (ref 0.44–1.00)
Creatinine, Ser: 0.87 mg/dL (ref 0.44–1.00)
GFR, Estimated: 29 mL/min — ABNORMAL LOW (ref >60)
GFR, Estimated: 38 mL/min — ABNORMAL LOW (ref >60)
GFR, Estimated: 55 mL/min — ABNORMAL LOW (ref >60)
GFR, Estimated: >60 mL/min (ref >60)
GFR, Estimated: >60 mL/min (ref >60)
GFR, Estimated: >60 mL/min (ref >60)
Glucose, Bld: 253 mg/dL — ABNORMAL HIGH (ref 70–99)
Glucose, Bld: 237 mg/dL — ABNORMAL HIGH (ref 70–99)
Glucose, Bld: 143 mg/dL — ABNORMAL HIGH (ref 70–99)
Glucose, Bld: 181 mg/dL — ABNORMAL HIGH (ref 70–99)
Glucose, Bld: 176 mg/dL — ABNORMAL HIGH (ref 70–99)
Glucose, Bld: 181 mg/dL — ABNORMAL HIGH (ref 70–99)
Potassium: 3.6 mmol/L (ref 3.5–5.1)
Potassium: 3.4 mmol/L — ABNORMAL LOW (ref 3.5–5.1)
Potassium: 5 mmol/L (ref 3.5–5.1)
Potassium: 4 mmol/L (ref 3.5–5.1)
Potassium: 3.9 mmol/L (ref 3.5–5.1)
Potassium: 3.3 mmol/L — ABNORMAL LOW (ref 3.5–5.1)
Sodium: 140 mmol/L (ref 135–145)
Sodium: 142 mmol/L (ref 135–145)
Sodium: 139 mmol/L (ref 135–145)
Sodium: 138 mmol/L (ref 135–145)
Sodium: 136 mmol/L (ref 135–145)
Sodium: 137 mmol/L (ref 135–145)

## 2021-02-24 LAB — BLOOD GAS, VENOUS
Acid-base deficit: 5.8 mmol/L — ABNORMAL HIGH (ref 0.0–2.0)
Bicarbonate: 19.1 mmol/L — ABNORMAL LOW (ref 20.0–28.0)
FIO2: 21
O2 Saturation: 82.1 %
Patient temperature: 37
pCO2, Ven: 37.8 mmHg — ABNORMAL LOW (ref 44.0–60.0)
pH, Ven: 7.324 (ref 7.250–7.430)
pO2, Ven: 43.5 mmHg (ref 32.0–45.0)

## 2021-02-24 LAB — GLUCOSE, CAPILLARY
Glucose-Capillary: 129 mg/dL — ABNORMAL HIGH (ref 70–99)
Glucose-Capillary: 134 mg/dL — ABNORMAL HIGH (ref 70–99)
Glucose-Capillary: 140 mg/dL — ABNORMAL HIGH (ref 70–99)
Glucose-Capillary: 152 mg/dL — ABNORMAL HIGH (ref 70–99)
Glucose-Capillary: 153 mg/dL — ABNORMAL HIGH (ref 70–99)
Glucose-Capillary: 159 mg/dL — ABNORMAL HIGH (ref 70–99)
Glucose-Capillary: 159 mg/dL — ABNORMAL HIGH (ref 70–99)
Glucose-Capillary: 165 mg/dL — ABNORMAL HIGH (ref 70–99)
Glucose-Capillary: 174 mg/dL — ABNORMAL HIGH (ref 70–99)
Glucose-Capillary: 175 mg/dL — ABNORMAL HIGH (ref 70–99)
Glucose-Capillary: 187 mg/dL — ABNORMAL HIGH (ref 70–99)
Glucose-Capillary: 194 mg/dL — ABNORMAL HIGH (ref 70–99)
Glucose-Capillary: 200 mg/dL — ABNORMAL HIGH (ref 70–99)
Glucose-Capillary: 215 mg/dL — ABNORMAL HIGH (ref 70–99)
Glucose-Capillary: 218 mg/dL — ABNORMAL HIGH (ref 70–99)
Glucose-Capillary: 218 mg/dL — ABNORMAL HIGH (ref 70–99)
Glucose-Capillary: 233 mg/dL — ABNORMAL HIGH (ref 70–99)
Glucose-Capillary: 251 mg/dL — ABNORMAL HIGH (ref 70–99)
Glucose-Capillary: 265 mg/dL — ABNORMAL HIGH (ref 70–99)
Glucose-Capillary: 312 mg/dL — ABNORMAL HIGH (ref 70–99)

## 2021-02-24 LAB — TROPONIN I (HIGH SENSITIVITY)
Troponin I (High Sensitivity): 15 ng/L (ref <18)
Troponin I (High Sensitivity): 15 ng/L (ref <18)

## 2021-02-24 LAB — LACTIC ACID, PLASMA: Lactic Acid, Venous: 1.9 mmol/L (ref 0.5–1.9)

## 2021-02-24 LAB — MRSA NEXT GEN BY PCR, NASAL: MRSA by PCR Next Gen: NOT DETECTED

## 2021-02-24 IMAGING — DX DG CHEST 1V PORT
1 series · 1 of 1 positions shown · non-contrast
Comparison: [DATE]

CLINICAL DATA: Fever

EXAM:
PORTABLE CHEST 1 VIEW

[chest]
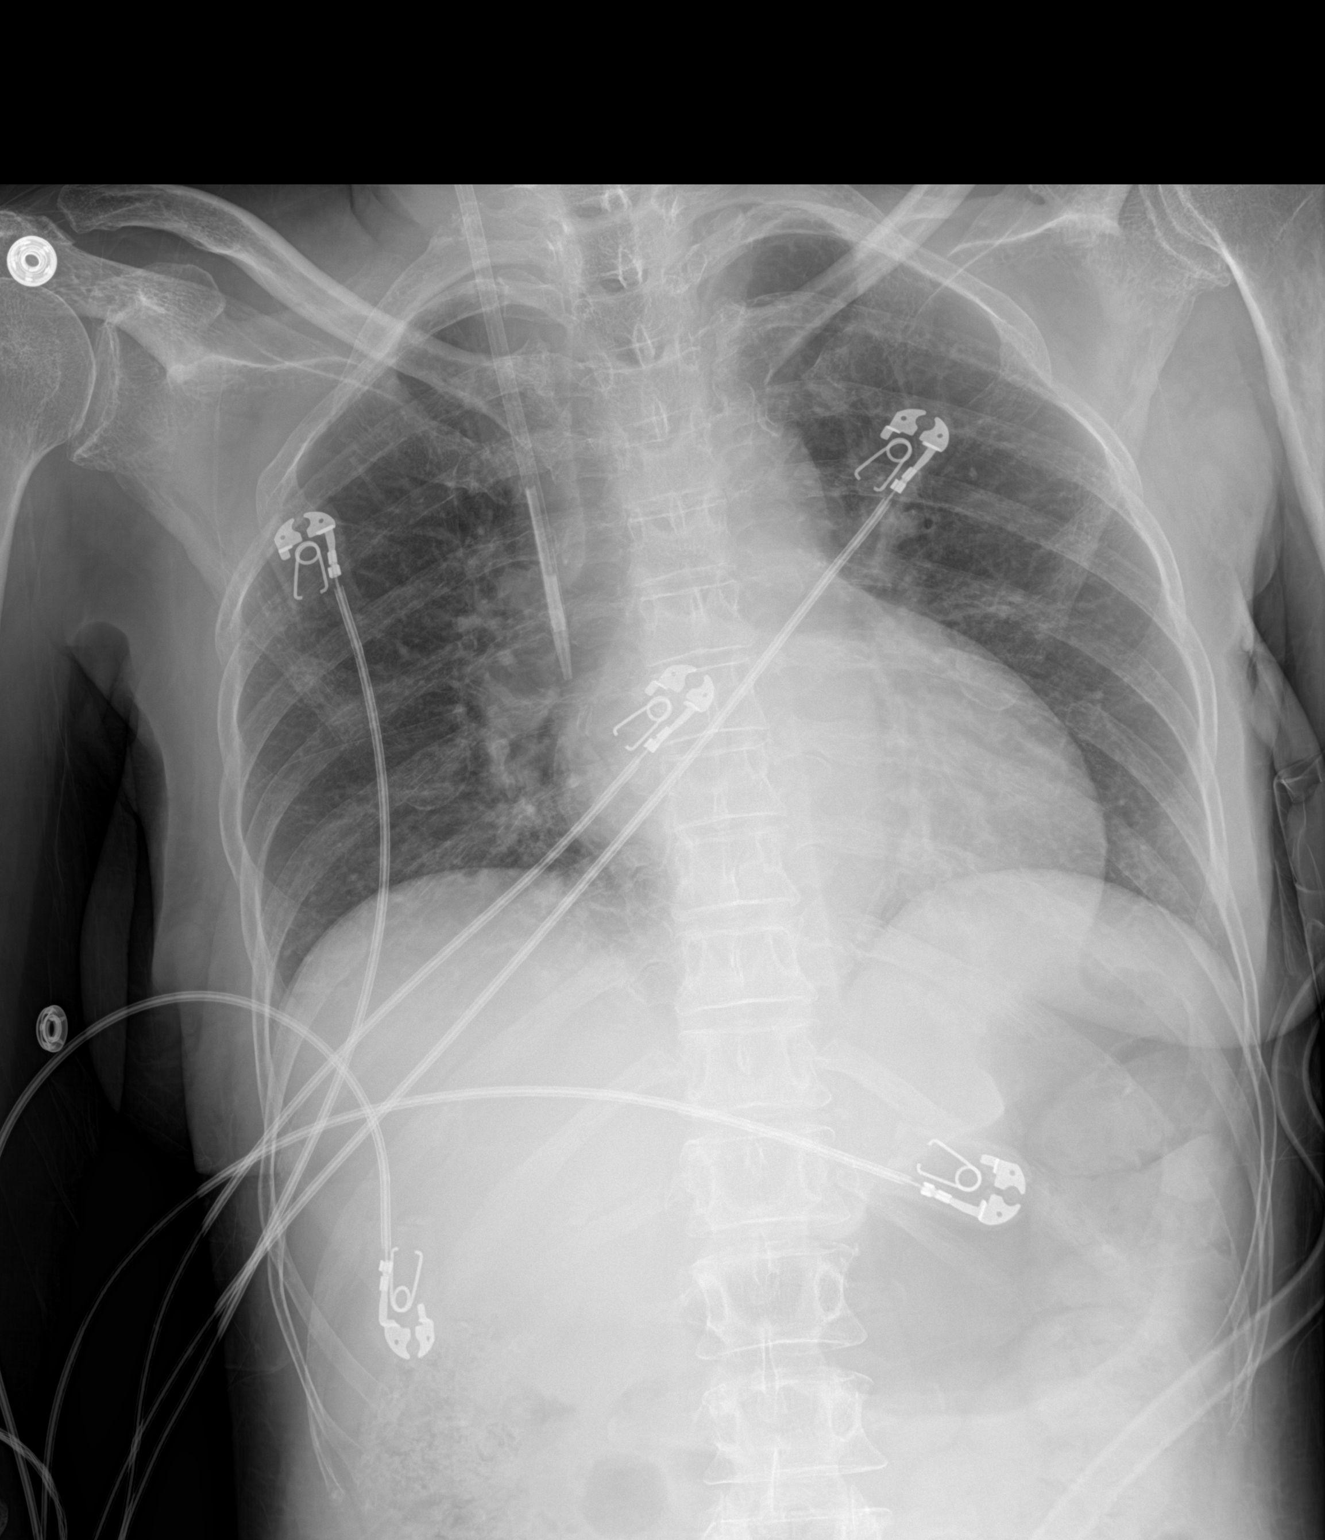

[1 of 1 positions shown; findings below may reference images not displayed]

FINDINGS: Central line unchanged with the tip in the SVC above the right
atrium. Slight increase in atelectasis at both lung bases. Upper
lungs remain clear. No visible effusion.
IMPRESSION: Slight worsening of atelectasis at the lung bases.

## 2021-02-24 MED ORDER — LACTATED RINGERS IV BOLUS
1000.0000 mL | Freq: Once | INTRAVENOUS | Status: AC
  Administered 2021-02-24: 1000 mL via INTRAVENOUS

## 2021-02-24 MED ORDER — POTASSIUM CHLORIDE 10 MEQ/100ML IV SOLN
10.0000 meq | INTRAVENOUS | Status: AC
  Administered 2021-02-24 (×5): 10 meq via INTRAVENOUS
  Filled 2021-02-24 (×5): qty 100

## 2021-02-24 MED ORDER — INSULIN DETEMIR 100 UNIT/ML ~~LOC~~ SOLN
8.0000 [IU] | Freq: Two times a day (BID) | SUBCUTANEOUS | Status: DC
  Administered 2021-02-24 – 2021-02-25 (×3): 8 [IU] via SUBCUTANEOUS
  Filled 2021-02-24 (×5): qty 0.08

## 2021-02-24 MED ORDER — POTASSIUM CHLORIDE 10 MEQ/50ML IV SOLN
10.0000 meq | INTRAVENOUS | Status: AC
  Administered 2021-02-25 (×6): 10 meq via INTRAVENOUS
  Filled 2021-02-24 (×6): qty 50

## 2021-02-24 MED ORDER — CHLORHEXIDINE GLUCONATE CLOTH 2 % EX PADS
6.0000 | MEDICATED_PAD | Freq: Every day | CUTANEOUS | Status: DC
  Administered 2021-02-24 – 2021-02-25 (×3): 6 via TOPICAL

## 2021-02-24 MED ORDER — INSULIN ASPART 100 UNIT/ML IJ SOLN
2.0000 [IU] | INTRAMUSCULAR | Status: DC
  Administered 2021-02-25: 4 [IU] via SUBCUTANEOUS
  Administered 2021-02-25 (×2): 6 [IU] via SUBCUTANEOUS

## 2021-02-24 MED ORDER — SODIUM CHLORIDE 0.9 % IV SOLN
2.0000 g | Freq: Two times a day (BID) | INTRAVENOUS | Status: DC
  Administered 2021-02-24 (×2): 2 g via INTRAVENOUS
  Filled 2021-02-24 (×5): qty 2

## 2021-02-24 NOTE — Progress Notes (Signed)
PHARMACY NOTE:  ANTIMICROBIAL RENAL DOSAGE ADJUSTMENT  Current antimicrobial regimen includes a mismatch between antimicrobial dosage and estimated renal function.  As per policy approved by the Pharmacy & Therapeutics and Medical Executive Committees, the antimicrobial dosage will be adjusted accordingly.  Current antimicrobial dosage:  cefepime 2g q24h  Indication: sepsis  Renal Function:  Estimated Creatinine Clearance: 31.1 mL/min (A) (by C-G formula based on SCr of 1.14 mg/dL (H)).    Antimicrobial dosage has been changed to:  cefepime 2g q12h  Additional comments:   Thank you for allowing pharmacy to be a part of this patient's care.  Gerrit Halls, PharmD, BCPS Clinical Pharmacist 02/24/2021 1:55 PM

## 2021-02-24 NOTE — Progress Notes (Signed)
eLink Physician-Brief Progress Note Patient Name: DORTHIE SANTINI DOB: 28-Apr-1961 MRN: [id number]   Date of Service  02/24/2021  HPI/Events of Note  Pt ready to come off insulin gtt based on latest lab data  eICU Interventions  Ordered transition order set     Intervention Category Minor Interventions: Routine modifications to care plan (e.g. PRN medications for pain, fever)  Jacinta Shoe 02/24/2021, 9:27 PM

## 2021-02-24 NOTE — Progress Notes (Addendum)
Inpatient Diabetes Program Recommendations  AACE/ADA: New Consensus Statement on Inpatient Glycemic Control (2015)  Target Ranges:  Prepandial:   less than 140 mg/dL      Peak postprandial:   less than 180 mg/dL (1-2 hours)      Critically ill patients:  140 - 180 mg/dL   Lab Results  Component Value Date   GLUCAP 159 (H) 02/24/2021   HGBA1C 10.1 (H) 12/26/2020    Review of Glycemic Control Results for Christine Benitez, Christine Benitez (MRN [id number]) as of 02/24/2021 13:31  Ref. Range 02/24/2021 09:21 02/24/2021 10:46 02/24/2021 11:30 02/24/2021 13:25  Glucose-Capillary Latest Ref Range: 70 - 99 mg/dL 782 (H) 423 (H) 536 (H) 159 (H)   Diabetes history: Type 2 DM Outpatient Diabetes medications: Novolog 5 units TID, Lantus 7 units BID Current orders for Inpatient glycemic control: IV insulin  Inpatient Diabetes Program Recommendations:    Per chart review on 11/26/20: "patient very hard of hearing and blind. Patient does not self inject. Daughter performs injections."  Spoke with patient's daughter [name] to discuss diabetes care. Has had 6 ED visits since June. Verified outpatient medications above. Does not check blood sugars.  Reviewed patient's current A1c of 10.1%. Explained what a A1c is and what it measures. Also reviewed goal A1c with patient, importance of good glucose control @ home, and blood sugar goals. Reviewed DKA, need for glucose monitoring, risk of giving insulin without knowing blood sugars, signs and symptoms of hyper vs hypo glycemia, vascular changes and commodities. Patient will need a glucose meter at discharge. Reviewed frequency for CBG checks and when to call PCP. Will place [facility] consult.  Daughter reports that patient has been really thirsty, So I give her regular sodas." Reviewed alternatives and why this is important to avoid. Daughter expresses understanding.   Question daughter's ability to provide care and injections to mother.   Following.  Thanks, Lujean Rave, MSN, RNC-OB Diabetes Coordinator [phone] (8a-5p)

## 2021-02-24 NOTE — Progress Notes (Addendum)
NAME:  Christine Benitez, MRN:  [id number], DOB:  [birth year], LOS: 1 ADMISSION DATE:  02/23/2021, CONSULTATION DATE:  02/23/21 REFERRING MD:  Vira Agar CHIEF COMPLAINT:  AMS   History of Present Illness:  Christine Benitez is a 60 y.o. female who has a PMH as outlined below including but not limited to DM with frequent admissions for DKA.  She presented to [facility] ED 10/17 with AMS and hyperglycemia.  Last seen normal around 0800 that morning then found with AMS around 1442 that afternoon. EMS was called and CBG was 485.  In ED, she remained altered.  CO2 <7, glucose 486, Lactic acid 3.7, beta hydroxybutyric acid >8.  She was given 2L LR bolus and insulin infusion was ordered.  Due to her AMS and profound acidosis, PCCM asked to admit to ICU.  Per daughter, no recent illness.  Unsure if has taken all meds or not.  She does confirm DNR status and confirms that pt is seen by palliative care as outpatient.  Pertinent  Medical History:  has Tobacco abuse; Diabetes mellitus (HCC); Family history of premature CAD; Abnormal LFTs; Hypertension; NICM (nonischemic cardiomyopathy) (HCC); Pulmonary hypertension (HCC); Chronic cholecystitis with calculus; Cerebral thrombosis with cerebral infarction; Acute urinary retention; Cardiogenic shock (HCC); AKI (acute kidney injury) (HCC); Goals of care, counseling/discussion; Palliative care by specialist; Altered mental status; DNR (do not resuscitate); DNI (do not intubate); Encounter for hospice care discussion; Pain; Protein-calorie malnutrition, severe; Heart failure with reduced ejection fraction (HCC); History of CVA (cerebrovascular accident); Nonischemic cardiomyopathy (HCC); Type 2 diabetes mellitus with complication, with long-term current use of insulin (HCC); Tobacco use; Mixed hyperlipidemia; Abnormal echocardiogram; Pericardial effusion; Iron deficiency anemia; History of blood transfusion; Chronic systolic CHF (congestive heart failure) (HCC); Resides in long term  care facility; Acute lower UTI; Urinary tract infection; Failure to thrive in adult; Proteus mirabilis infection; Hypoglycemia; Constipation; Cachexia (HCC); Malnutrition of moderate degree; Hyperkalemia; Severe sepsis (HCC); Sepsis due to pneumonia [facility]); DKA (diabetic ketoacidosis) (HCC); Nausea & vomiting; Dehydration; Chronic diastolic CHF (congestive heart failure) (HCC); Encephalopathy acute; and Metabolic acidosis on their problem list.  Significant Hospital Events: Including procedures, antibiotic start and stop dates in addition to other pertinent events   10/17 > admit. 10/18 continued on insulin gtt  Interim History / Subjective:  Somnolent, but opens eyes to voice Continued AGMA  Objective:  Blood pressure 117/72, pulse (!) 104, temperature 98.6 F (37 C), temperature source Rectal, resp. rate 17, weight 37.1 kg, SpO2 99 %.        Intake/Output Summary (Last 24 hours) at 02/24/2021 0851 Last data filed at 02/24/2021 0500 Gross per 24 hour  Intake 3423.3 ml  Output 650 ml  Net 2773.3 ml   Filed Weights   02/23/21 2108  Weight: 37.1 kg    General:  acute on chronically ill-appearing F in no acute distress HEENT: MM pink/dry, pupils equal Neuro: somnolent, though protecting her airway, opens eyes to voice, but will not follow commands or answer questions CV: s1s2 rrr, no m/r/g PULM:  no hypoxia on RA, no rhonchi or wheezing bilaterall GI: soft, bsx4 active  Extremities: warm/dry, no edema  Skin: +skin tenting no rashes or lesions    Labs/imaging personally reviewed:  CT head 10/17 > chronic changes, no acute process.  Assessment & Plan:   Acute DKA  POA  unclear what provoked this at this point. Hx DM - per notes, has had some problems with compliance and correct regimen etc. P: - Continue insulin and  fluids per DKA protocol. -additional L LR today, appears volume down -continue to follow BMP until gap closes  - Needs ongoing DM education. - Hold home  Novolog, lantus. -replete potassium -checking troponin and repeat EKG  Possible sepsis  No clear source at this point but can't definitively rule out and still febrile overnight P: - Continue empiric abx for now, repeat CXR, if no clear source likely de-escalate  - Follow cultures. -UA without infection    Acute Encephalopathy Likely secondary to DKA and metabolic derangement P: -continue supportive care and treating underlying issue - Hold sedating meds.  AoCKD  Suspect pre-renal secondary to volume depletion P: -continue follow renal indices as continue to hydrate -follow UOP    Hx HTN, dCHF, NICM (echo from June 2022 with EF 50-55%, G1DD). - Continue to hold home Clopidogrel, ASA and can resume once mental status improves.  Protein Calorie Malnutrition. - Nutrition consult once mental status improves and can safely take PO.  Hx CVA. - Supportive care, head CT negative for acute findings   Goals of care - palliative care following as outpatient. - Continue current plan / follow up.  Best practice (evaluated daily):  Diet/type: NPO DVT prophylaxis: prophylactic heparin  GI prophylaxis: N/A Lines: Central line Foley:  N/A Code Status:  DNR, confirmed with daughter Tsering Leaman [phone]) Last date of multidisciplinary goals of care discussion: None yet. Daughter updated via phone 10/18  Labs   CBC: Recent Labs  Lab 02/23/21 1547 02/23/21 2201  WBC 19.1* 18.0*  NEUTROABS 16.3*  --   HGB 11.4* 10.5*  HCT 39.4 35.3*  MCV 105.6* 103.5*  PLT 329 236     Basic Metabolic Panel: Recent Labs  Lab 02/23/21 1547 02/23/21 2154 02/24/21 0257 02/24/21 0756  NA 137 138 140 142  K 4.6 4.3 3.6 3.4*  CL 101 107 108 109  CO2 <7* <7* 12* 20*  GLUCOSE 486* 436* 253* 237*  BUN 23* 23* 22* 17  CREATININE 2.05* 1.80* 1.95* 1.55*  CALCIUM 10.3 9.3 8.9 9.1    GFR: Estimated Creatinine Clearance: 22.9 mL/min (A) (by C-G formula based on SCr of 1.55 mg/dL  (H)). Recent Labs  Lab 02/23/21 1547 02/23/21 1555 02/23/21 2201 02/24/21 0700  WBC 19.1*  --  18.0*  --   LATICACIDVEN  --  3.7*  --  1.9     Liver Function Tests: Recent Labs  Lab 02/23/21 1547  AST 15  ALT 9  ALKPHOS 61  BILITOT 1.8*  PROT 7.3  ALBUMIN 3.7    No results for input(s): LIPASE, AMYLASE in the last 168 hours. No results for input(s): AMMONIA in the last 168 hours.  ABG    Component Value Date/Time   PHART 7.342 (L) 11/25/2020 0153   PCO2ART 39.5 11/25/2020 0153   PO2ART 109 (H) 11/25/2020 0153   HCO3 19.1 (L) 02/24/2021 0800   TCO2 20 (L) 12/25/2020 2057   ACIDBASEDEF 5.8 (H) 02/24/2021 0800   O2SAT 82.1 02/24/2021 0800      Coagulation Profile: No results for input(s): INR, PROTIME in the last 168 hours.  Cardiac Enzymes: No results for input(s): CKTOTAL, CKMB, CKMBINDEX, TROPONINI in the last 168 hours.  HbA1C: Hgb A1c MFr Bld  Date/Time Value Ref Range Status  12/26/2020 09:32 AM 10.1 (H) 4.8 - 5.6 % Final    Comment:    (NOTE) Pre diabetes:          5.7%-6.4%  Diabetes:              >  6.4%  Glycemic control for   <7.0% adults with diabetes   10/24/2020 10:51 PM 7.7 (H) 4.8 - 5.6 % Final    Comment:    (NOTE)         Prediabetes: 5.7 - 6.4         Diabetes: >6.4         Glycemic control for adults with diabetes: <7.0     CBG: Recent Labs  Lab 02/24/21 0353 02/24/21 0505 02/24/21 0558 02/24/21 0651 02/24/21 0748  GLUCAP 233* 215* 218* 200* 194*     Review of Systems:   Unable to obtain as pt is encephalopathic.  Past Medical History:  She,  has a past medical history of Abnormal LFTs, Chronic systolic CHF (congestive heart failure) (HCC), History of blood transfusion (1986), Hypertension, Insulin dependent diabetes mellitus, Iron deficiency anemia, NICM (nonischemic cardiomyopathy) (HCC), Pericardial effusion, Pulmonary hypertension (HCC), and Tobacco abuse.   Surgical History:   Past Surgical History:   Procedure Laterality Date   APPENDECTOMY  ~ 2003   CESAREAN SECTION  1986   LEFT HEART CATHETERIZATION WITH CORONARY ANGIOGRAM N/A 06/11/2014   Procedure: LEFT HEART CATHETERIZATION WITH CORONARY ANGIOGRAM;  Surgeon: Peter M Swaziland, MD;  Location: [facility] CATH LAB;  Service: Cardiovascular;  Laterality: N/A;     Social History:   reports that she quit smoking about 5 years ago. Her smoking use included cigarettes. She has a 11.00 pack-year smoking history. She has never used smokeless tobacco. She reports that she does not drink alcohol and does not use drugs.   Family History:  Her family history includes Coronary artery disease in her mother; Diabetes in her mother; Lung cancer in her father; Lupus in her mother; Stroke in her mother.   Allergies No Known Allergies   Home Medications  Prior to Admission medications   Medication Sig Start Date End Date Taking? Authorizing Provider  acetaminophen (TYLENOL) 650 MG CR tablet Take 1,300 mg by mouth every 8 (eight) hours as needed for pain.   Yes [provider]  albuterol (VENTOLIN HFA) 108 (90 Base) MCG/ACT inhaler Inhale 2 puffs into the lungs every 6 (six) hours as needed for wheezing or shortness of breath. 10/28/20  Yes Drema Dallas, MD  clopidogrel (PLAVIX) 75 MG tablet Take 1 tablet (75 mg total) by mouth daily. 10/28/20 10/28/21 Yes Drema Dallas, MD  insulin aspart (NOVOLOG FLEXPEN) 100 UNIT/ML FlexPen Inject 5 Units into the skin 3 (three) times daily with meals. 12/03/20  Yes Lanae Boast, MD  insulin glargine (LANTUS) 100 UNIT/ML Solostar Pen Inject 7 Units into the skin 2 (two) times daily. Patient taking differently: Inject 10 Units into the skin 2 (two) times daily. 12/03/20  Yes Lanae Boast, MD  aspirin 81 MG EC tablet TAKE 1 TABLET (81 MG TOTAL) BY MOUTH DAILY. Patient not taking: Reported on 02/23/2021 09/10/14   [place], Peter M, MD  atorvastatin (LIPITOR) 40 MG tablet Take 1 tablet (40 mg total) by mouth every  evening. Patient not taking: Reported on 02/23/2021 03/26/20   Evlyn Kanner, MD  dronabinol (MARINOL) 5 MG capsule Take 1 capsule (5 mg total) by mouth 2 (two) times daily before lunch and supper. Patient not taking: Reported on 02/23/2021 10/28/20   Drema Dallas, MD  feeding supplement (ENSURE ENLIVE / ENSURE PLUS) LIQD Take 237 mLs by mouth 2 (two) times daily between meals. Patient not taking: No sig reported 11/06/20   Kathlen Mody, MD  feeding supplement (ENSURE ENLIVE /  ENSURE PLUS) LIQD Take 237 mLs by mouth 3 (three) times daily between meals. Patient not taking: No sig reported 11/18/20   Esaw Grandchild A, DO  Insulin Pen Needle (PENTIPS) 32G X 4 MM MISC Use as directed 11/28/20   Darlin Drop, DO  ivabradine (CORLANOR) 5 MG TABS tablet Take 0.5 tablets (2.5 mg total) by mouth 2 (two) times daily with a meal. Patient not taking: Reported on 02/23/2021 10/16/20   Robbie Lis M, PA-C  mirtazapine (REMERON SOL-TAB) 15 MG disintegrating tablet Take 0.5 tablets (7.5 mg total) by mouth at bedtime. Patient not taking: Reported on 02/23/2021 03/26/20   Evlyn Kanner, MD  Multiple Vitamin (MULTIVITAMIN WITH MINERALS) TABS tablet Take 1 tablet by mouth daily. Patient not taking: Reported on 02/23/2021 11/07/20   Kathlen Mody, MD  insulin aspart protamine - aspart (NOVOLOG 70/30 MIX) (70-30) 100 UNIT/ML FlexPen Inject 0.04 mLs (4 Units total) into the skin 2 (two) times daily with a meal. 11/28/20 12/01/20  Darlin Drop, DO     Critical care time: 35 min.   CRITICAL CARE Performed by: Darcella Gasman Mykah Shin   Total critical care time: 35 minutes  Critical care time was exclusive of separately billable procedures and treating other patients.  Critical care was necessary to treat or prevent imminent or life-threatening deterioration.  Critical care was time spent personally by me on the following activities: development of treatment plan with patient and/or surrogate as well as  nursing, discussions with consultants, evaluation of patient's response to treatment, examination of patient, obtaining history from patient or surrogate, ordering and performing treatments and interventions, ordering and review of laboratory studies, ordering and review of radiographic studies, pulse oximetry and re-evaluation of patient's condition.   Darcella Gasman Kathlee Barnhardt, PA-C [facility] See Amion for pager If no response to pager , please call [phone] until 7pm After 7:00 pm call Elink  [phone]

## 2021-02-25 ENCOUNTER — Inpatient Hospital Stay (HOSPITAL_COMMUNITY): Payer: Medicaid Other

## 2021-02-25 DIAGNOSIS — G9340 Encephalopathy, unspecified: Secondary | ICD-10-CM

## 2021-02-25 DIAGNOSIS — R509 Fever, unspecified: Secondary | ICD-10-CM

## 2021-02-25 DIAGNOSIS — E1110 Type 2 diabetes mellitus with ketoacidosis without coma: Secondary | ICD-10-CM

## 2021-02-25 LAB — PHOSPHORUS
Phosphorus: <1 mg/dL — CL (ref 2.5–4.6)
Phosphorus: 2.1 mg/dL — ABNORMAL LOW (ref 2.5–4.6)
Phosphorus: 4.6 mg/dL (ref 2.5–4.6)

## 2021-02-25 LAB — GLUCOSE, CAPILLARY
Glucose-Capillary: 104 mg/dL — ABNORMAL HIGH (ref 70–99)
Glucose-Capillary: 126 mg/dL — ABNORMAL HIGH (ref 70–99)
Glucose-Capillary: 127 mg/dL — ABNORMAL HIGH (ref 70–99)
Glucose-Capillary: 154 mg/dL — ABNORMAL HIGH (ref 70–99)
Glucose-Capillary: 178 mg/dL — ABNORMAL HIGH (ref 70–99)
Glucose-Capillary: 203 mg/dL — ABNORMAL HIGH (ref 70–99)
Glucose-Capillary: 233 mg/dL — ABNORMAL HIGH (ref 70–99)
Glucose-Capillary: 57 mg/dL — ABNORMAL LOW (ref 70–99)
Glucose-Capillary: 70 mg/dL (ref 70–99)

## 2021-02-25 LAB — BASIC METABOLIC PANEL
Anion gap: 8 (ref 5–15)
Anion gap: 9 (ref 5–15)
Anion gap: 6 (ref 5–15)
Anion gap: 6 (ref 5–15)
Anion gap: 12 (ref 5–15)
BUN: 5 mg/dL — ABNORMAL LOW (ref 6–20)
BUN: 8 mg/dL (ref 6–20)
BUN: 6 mg/dL (ref 6–20)
BUN: 8 mg/dL (ref 6–20)
BUN: 8 mg/dL (ref 6–20)
CO2: 24 mmol/L (ref 22–32)
CO2: 25 mmol/L (ref 22–32)
CO2: 27 mmol/L (ref 22–32)
CO2: 25 mmol/L (ref 22–32)
CO2: 24 mmol/L (ref 22–32)
Calcium: 8.3 mg/dL — ABNORMAL LOW (ref 8.9–10.3)
Calcium: 8.7 mg/dL — ABNORMAL LOW (ref 8.9–10.3)
Calcium: 8.4 mg/dL — ABNORMAL LOW (ref 8.9–10.3)
Calcium: 8.3 mg/dL — ABNORMAL LOW (ref 8.9–10.3)
Calcium: 8 mg/dL — ABNORMAL LOW (ref 8.9–10.3)
Chloride: 98 mmol/L (ref 98–111)
Chloride: 102 mmol/L (ref 98–111)
Chloride: 104 mmol/L (ref 98–111)
Chloride: 106 mmol/L (ref 98–111)
Chloride: 102 mmol/L (ref 98–111)
Creatinine, Ser: 0.79 mg/dL (ref 0.44–1.00)
Creatinine, Ser: 0.87 mg/dL (ref 0.44–1.00)
Creatinine, Ser: 0.79 mg/dL (ref 0.44–1.00)
Creatinine, Ser: 0.79 mg/dL (ref 0.44–1.00)
Creatinine, Ser: 0.75 mg/dL (ref 0.44–1.00)
GFR, Estimated: >60 mL/min (ref >60)
GFR, Estimated: >60 mL/min (ref >60)
GFR, Estimated: >60 mL/min (ref >60)
GFR, Estimated: >60 mL/min (ref >60)
GFR, Estimated: >60 mL/min (ref >60)
Glucose, Bld: 175 mg/dL — ABNORMAL HIGH (ref 70–99)
Glucose, Bld: 91 mg/dL (ref 70–99)
Glucose, Bld: 108 mg/dL — ABNORMAL HIGH (ref 70–99)
Glucose, Bld: 83 mg/dL (ref 70–99)
Glucose, Bld: 128 mg/dL — ABNORMAL HIGH (ref 70–99)
Potassium: 4.1 mmol/L (ref 3.5–5.1)
Potassium: 3.9 mmol/L (ref 3.5–5.1)
Potassium: 3.8 mmol/L (ref 3.5–5.1)
Potassium: 3.8 mmol/L (ref 3.5–5.1)
Potassium: 3.4 mmol/L — ABNORMAL LOW (ref 3.5–5.1)
Sodium: 130 mmol/L — ABNORMAL LOW (ref 135–145)
Sodium: 136 mmol/L (ref 135–145)
Sodium: 137 mmol/L (ref 135–145)
Sodium: 137 mmol/L (ref 135–145)
Sodium: 138 mmol/L (ref 135–145)

## 2021-02-25 LAB — BLOOD CULTURE ID PANEL (REFLEXED) - BCID2

## 2021-02-25 LAB — MAGNESIUM
Magnesium: 1.2 mg/dL — ABNORMAL LOW (ref 1.7–2.4)
Magnesium: 2 mg/dL (ref 1.7–2.4)

## 2021-02-25 LAB — ECHOCARDIOGRAM LIMITED
Calc EF: 46.8 %
Height: 65 in
S' Lateral: 2.5 cm
Single Plane A2C EF: 50.5 %
Single Plane A4C EF: 42.9 %
Weight: 1372.14 oz

## 2021-02-25 LAB — BETA-HYDROXYBUTYRIC ACID
Beta-Hydroxybutyric Acid: 0.12 mmol/L (ref 0.05–0.27)
Beta-Hydroxybutyric Acid: 0.35 mmol/L — ABNORMAL HIGH (ref 0.05–0.27)
Beta-Hydroxybutyric Acid: 0.21 mmol/L (ref 0.05–0.27)
Beta-Hydroxybutyric Acid: 0.81 mmol/L — ABNORMAL HIGH (ref 0.05–0.27)
Beta-Hydroxybutyric Acid: 0.34 mmol/L — ABNORMAL HIGH (ref 0.05–0.27)

## 2021-02-25 IMAGING — DX DG ABD PORTABLE 1V
1 series · 1 of 1 positions shown · non-contrast
Comparison: CT chest, abdomen, and pelvis [DATE]. Abdominal
radiograph [DATE].

CLINICAL DATA: Feeding tube placement.

EXAM:
PORTABLE ABDOMEN - 1 VIEW

[abdomen]
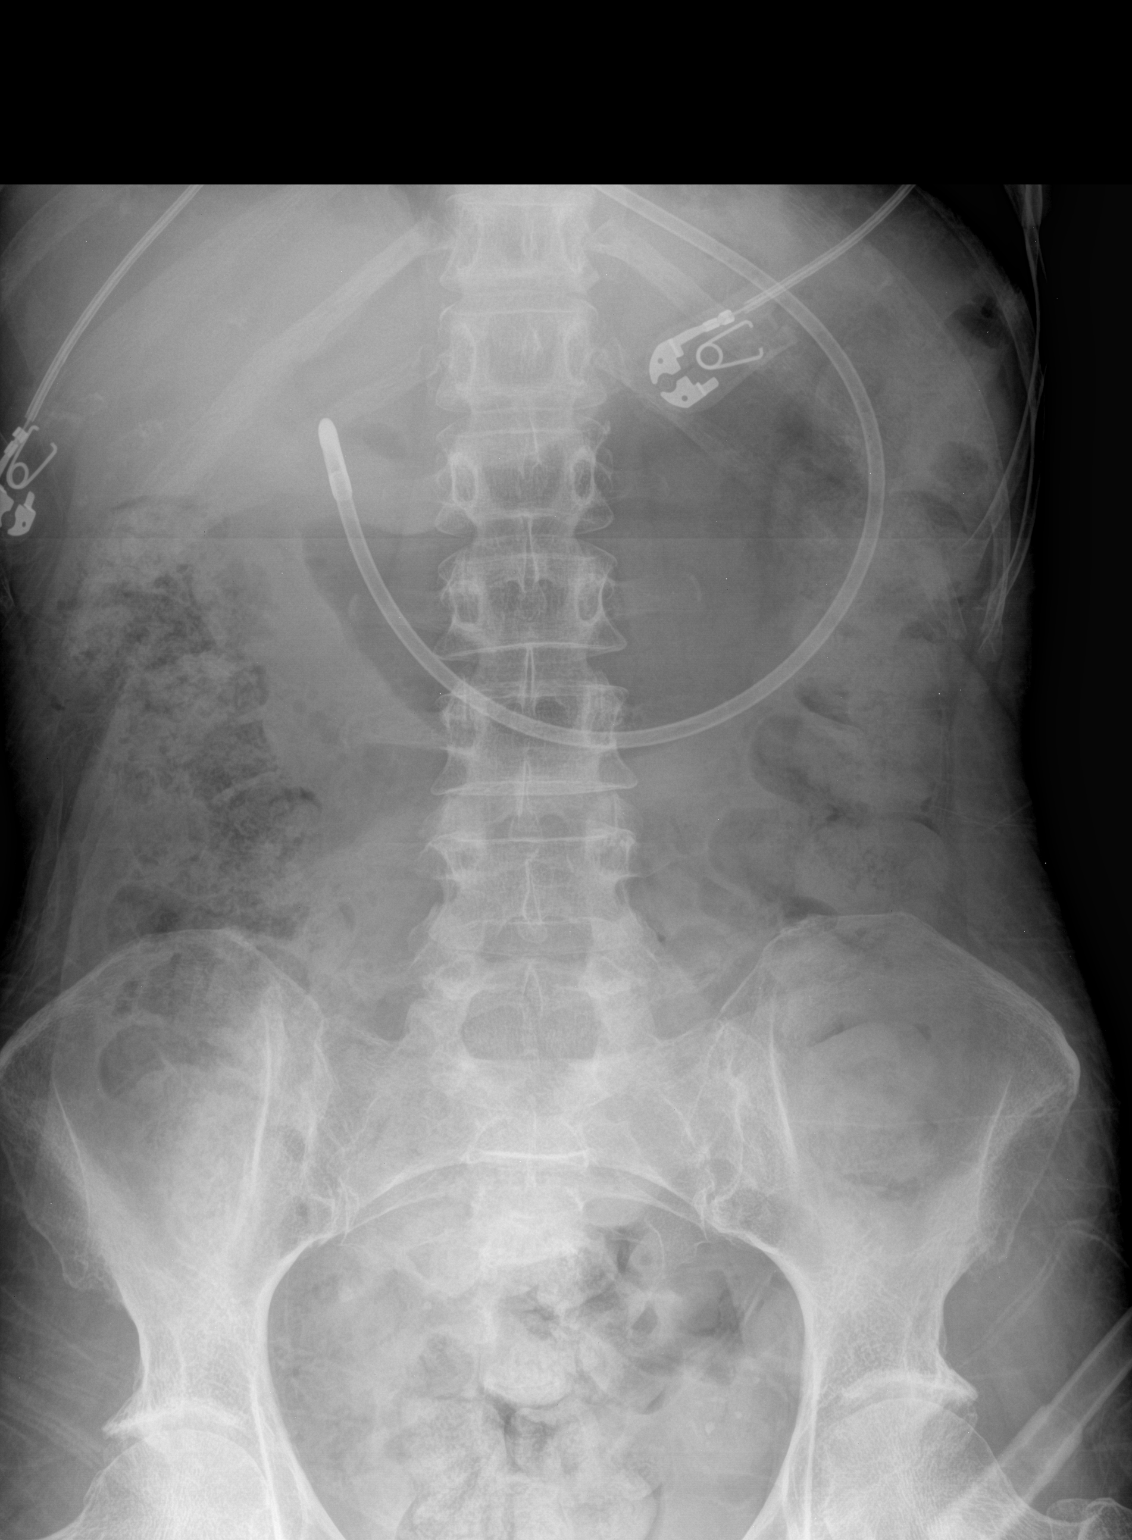

[1 of 1 positions shown; findings below may reference images not displayed]

FINDINGS: A feeding tube has been placed with tip at the level of the duodenal
bulb. There is mild gaseous distension of the stomach. A moderately
large amount of stool is present throughout the colon. No dilated
loops of bowel are seen to suggest obstruction. No acute osseous
abnormality is seen.
IMPRESSION: Feeding tube terminate at the level of the duodenal bulb.

## 2021-02-25 MED ORDER — SODIUM CHLORIDE 0.9% FLUSH: 10.0000 mL | INTRAVENOUS | Status: DC | PRN

## 2021-02-25 MED ORDER — METOPROLOL TARTRATE 12.5 MG HALF TABLET
12.5000 mg | ORAL_TABLET | Freq: Two times a day (BID) | ORAL | Status: DC
  Administered 2021-02-25: 12.5 mg via ORAL
  Filled 2021-02-25: qty 1

## 2021-02-25 MED ORDER — MAGNESIUM SULFATE 2 GM/50ML IV SOLN
2.0000 g | Freq: Once | INTRAVENOUS | Status: AC
  Administered 2021-02-25: 2 g via INTRAVENOUS
  Filled 2021-02-25: qty 50

## 2021-02-25 MED ORDER — SODIUM CHLORIDE 0.9% FLUSH
10.0000 mL | Freq: Two times a day (BID) | INTRAVENOUS | Status: DC
  Administered 2021-02-25: 20 mL
  Administered 2021-02-25 – 2021-02-28 (×4): 10 mL
  Administered 2021-02-28: 40 mL
  Administered 2021-03-02: 10 mL

## 2021-02-25 MED ORDER — ADULT MULTIVITAMIN W/MINERALS CH
1.0000 | ORAL_TABLET | Freq: Every day | ORAL | Status: DC
  Administered 2021-02-25: 1 via ORAL
  Filled 2021-02-25: qty 1

## 2021-02-25 MED ORDER — OSMOLITE 1.2 CAL PO LIQD
1000.0000 mL | ORAL | Status: DC
  Administered 2021-02-25 – 2021-02-26 (×2): 1000 mL
  Filled 2021-02-25 (×2): qty 1000

## 2021-02-25 MED ORDER — ENSURE ENLIVE PO LIQD
237.0000 mL | Freq: Three times a day (TID) | ORAL | Status: DC
  Administered 2021-02-25: 237 mL via ORAL

## 2021-02-25 MED ORDER — PROSOURCE TF PO LIQD: 45.0000 mL | Freq: Two times a day (BID) | ORAL | Status: DC

## 2021-02-25 MED ORDER — ATORVASTATIN CALCIUM 40 MG PO TABS
40.0000 mg | ORAL_TABLET | Freq: Every day | ORAL | Status: DC
  Administered 2021-02-25: 40 mg via ORAL
  Filled 2021-02-25: qty 1

## 2021-02-25 MED ORDER — PIVOT 1.5 CAL PO LIQD: 1000.0000 mL | ORAL | Status: DC

## 2021-02-25 MED ORDER — SODIUM PHOSPHATES 45 MMOLE/15ML IV SOLN
30.0000 mmol | Freq: Once | INTRAVENOUS | Status: AC
  Administered 2021-02-25: 30 mmol via INTRAVENOUS
  Filled 2021-02-25 (×2): qty 10

## 2021-02-25 MED ORDER — CEFTRIAXONE SODIUM 2 G IJ SOLR
2.0000 g | INTRAMUSCULAR | Status: AC
Start: 2021-02-25 — End: 2021-02-27
  Administered 2021-02-25 – 2021-02-27 (×3): 2 g via INTRAVENOUS
  Filled 2021-02-25 (×3): qty 20

## 2021-02-25 MED ORDER — DEXTROSE 50 % IV SOLN
INTRAVENOUS | Status: AC
  Filled 2021-02-25: qty 50

## 2021-02-25 MED ORDER — PIVOT 1.5 CAL PO LIQD
1000.0000 mL | ORAL | Status: DC
  Filled 2021-02-25: qty 1000

## 2021-02-25 MED ORDER — DEXTROSE 50 % IV SOLN
12.5000 g | INTRAVENOUS | Status: AC
  Administered 2021-02-25: 12.5 g via INTRAVENOUS

## 2021-02-25 NOTE — Progress Notes (Signed)
PHARMACY - PHYSICIAN COMMUNICATION CRITICAL VALUE ALERT - BLOOD CULTURE IDENTIFICATION (BCID)  Christine Benitez is an 60 y.o. female who presented to [facility] on 02/23/2021 with a chief complaint of possible sepsis  Assessment:  GPC clusters in 1/3 blood culture bottles. Nothing growing on BCID panel. Likely contaminant.  Name of physician (or Provider) Contacted: Dr. Eulah Citizen  Current antibiotics: Cefepime  Changes to prescribed antibiotics recommended:  No additional abx needed  Results for orders placed or performed during the hospital encounter of 02/23/21  Blood Culture ID Panel (Reflexed) (Collected: 02/23/2021  5:28 PM)  Result Value Ref Range   Enterococcus faecalis NOT DETECTED NOT DETECTED   Enterococcus Faecium NOT DETECTED NOT DETECTED   Listeria monocytogenes NOT DETECTED NOT DETECTED   Staphylococcus species NOT DETECTED NOT DETECTED   Staphylococcus aureus (BCID) NOT DETECTED NOT DETECTED   Staphylococcus epidermidis NOT DETECTED NOT DETECTED   Staphylococcus lugdunensis NOT DETECTED NOT DETECTED   Streptococcus species NOT DETECTED NOT DETECTED   Streptococcus agalactiae NOT DETECTED NOT DETECTED   Streptococcus pneumoniae NOT DETECTED NOT DETECTED   Streptococcus pyogenes NOT DETECTED NOT DETECTED   A.calcoaceticus-baumannii NOT DETECTED NOT DETECTED   Bacteroides fragilis NOT DETECTED NOT DETECTED   Enterobacterales NOT DETECTED NOT DETECTED   Enterobacter cloacae complex NOT DETECTED NOT DETECTED   Escherichia coli NOT DETECTED NOT DETECTED   Klebsiella aerogenes NOT DETECTED NOT DETECTED   Klebsiella oxytoca NOT DETECTED NOT DETECTED   Klebsiella pneumoniae NOT DETECTED NOT DETECTED   Proteus species NOT DETECTED NOT DETECTED   Salmonella species NOT DETECTED NOT DETECTED   Serratia marcescens NOT DETECTED NOT DETECTED   Haemophilus influenzae NOT DETECTED NOT DETECTED   Neisseria meningitidis NOT DETECTED NOT DETECTED   Pseudomonas aeruginosa NOT  DETECTED NOT DETECTED   Stenotrophomonas maltophilia NOT DETECTED NOT DETECTED   Candida albicans NOT DETECTED NOT DETECTED   Candida auris NOT DETECTED NOT DETECTED   Candida glabrata NOT DETECTED NOT DETECTED   Candida krusei NOT DETECTED NOT DETECTED   Candida parapsilosis NOT DETECTED NOT DETECTED   Candida tropicalis NOT DETECTED NOT DETECTED   Cryptococcus neoformans/gattii NOT DETECTED NOT DETECTED    Christoper Fabian, PharmD, BCPS Please see amion for complete clinical pharmacist phone list 02/25/2021  5:42 AM

## 2021-02-25 NOTE — Progress Notes (Addendum)
Brief Nutrition Note  Consult received for enteral/tube feeding initiation and management.  Full assessment performed earlier today, please see note for details. Discussed pt with RN and MD. Pt on dysphagia 3 diet with thin liquids but has had minimal po so Cortrak ordered to help meet nutrition needs. Will initiate pt on the following: Osmolite 1.2 @ 5ml/hr advance 58ml/hr Q4H until goal rate of 66ml/hr is reached (1221ml/d). At goal, TF will provide 1440 kcals, 66 grams protein, free water. Monitor magnesium, potassium, and phosphorus daily for at least 3 days, MD to replete as needed, as pt is at risk for refeeding syndrome.   Admitting Dx: DKA (diabetic ketoacidosis) (HCC) [E11.10] Unresponsive [R41.89] Central line insertion site infection [T80.212A]  Body mass index is 14.27 kg/m. Pt meets criteria for underweight based on current BMI.  Labs:  Recent Labs  Lab 02/25/21 0234 02/25/21 0618 02/25/21 1016  NA 130* 136 137  K 4.1 3.9 3.8  CL 98 102 104  CO2 24 25 27   BUN 5* 8 6  CREATININE 0.79 0.87 0.79  CALCIUM 8.3* 8.7* 8.4*  MG  --   --  1.2*  PHOS  --   --  <1.0*  GLUCOSE 175* 91 108*    Christine Kramar, MS, RD, LDN (she/her/hers) RD pager number and weekend/on-call pager number located in Amion.

## 2021-02-25 NOTE — Procedures (Signed)
Cortrak  Person Inserting Tube:  Emmilynn Marut E, RD Tube Type:  Cortrak - 43 inches Tube Size:  10 Tube Location:  Left nare Initial Placement:  Stomach Secured by: Bridle Technique Used to Measure Tube Placement:  Marking at nare/corner of mouth Cortrak Secured At:  65 cm   Cortrak Tube Team Note:  Consult received to place a Cortrak feeding tube.   X-ray is required, abdominal x-ray has been ordered by the Cortrak team. Please confirm tube placement before using the Cortrak tube.   If the tube becomes dislodged please keep the tube and contact the Cortrak team at [email or web address] (password [email or web address]) for replacement.  If after hours and replacement cannot be delayed, place a NG tube and confirm placement with an abdominal x-ray.    Gilverto Dileonardo, MS, RD, LDN (she/her/hers) RD pager number and weekend/on-call pager number located in Amion.    

## 2021-02-25 NOTE — Progress Notes (Signed)
Inpatient Diabetes Program Recommendations  AACE/ADA: New Consensus Statement on Inpatient Glycemic Control (2015)  Target Ranges:  Prepandial:   less than 140 mg/dL      Peak postprandial:   less than 180 mg/dL (1-2 hours)      Critically ill patients:  140 - 180 mg/dL   Lab Results  Component Value Date   GLUCAP 127 (H) 02/25/2021   HGBA1C 10.1 (H) 12/26/2020    Review of Glycemic Control Results for CARENA, STREAM (MRN [id number]) as of 02/25/2021 08:56  Ref. Range 02/25/2021 03:46 02/25/2021 07:16 02/25/2021 08:05  Glucose-Capillary Latest Ref Range: 70 - 99 mg/dL 409 (H) 57 (L) 811 (H)   Diabetes history: Type 2 DM Outpatient Diabetes medications: Novolog 5 units TID, Lantus 7 units BID Current orders for Inpatient glycemic control: Levemir 8 units BID, Novolog 2-6 units Q4H   Inpatient Diabetes Program Recommendations:    Noted hypoglycemic event of 57 mg/dL following Novolog 4 units. May want to consider reducing to Novolog 1-3 units Q4H.   Thanks, Lujean Rave, MSN, RNC-OB Diabetes Coordinator [phone] (8a-5p)

## 2021-02-25 NOTE — Progress Notes (Signed)
  Echocardiogram 2D Echocardiogram has been performed.  Christine Benitez 02/25/2021, 3:02 PM

## 2021-02-25 NOTE — Progress Notes (Signed)
Hypoglycemic Event  CBG: 54  Treatment: D50 25 mL (12.5 gm)  Symptoms: None  Follow-up CBG: Time:0805 CBG Result:127  Possible Reasons for Event: Inadequate meal intake  Comments/MD notified:Ellison    Nedra Hai, Dagmar Hait

## 2021-02-25 NOTE — Progress Notes (Signed)
NAME:  Christine Benitez, MRN:  [id number], DOB:  1961-03-27, LOS: 2 ADMISSION DATE:  02/23/2021, CONSULTATION DATE:  02/23/21 REFERRING MD:  Vira Agar CHIEF COMPLAINT:  AMS   History of Present Illness:  Christine Benitez is a 60 y.o. female who has a PMH as outlined below including but not limited to DM with frequent admissions for DKA.  She presented to [facility] ED 10/17 with AMS and hyperglycemia.  Last seen normal around 0800 that morning then found with AMS around 1442 that afternoon. EMS was called and CBG was 485.  In ED, she remained altered.  CO2 <7, glucose 486, Lactic acid 3.7, beta hydroxybutyric acid >8.  She was given 2L LR bolus and insulin infusion was ordered.  Due to her AMS and profound acidosis, PCCM asked to admit to ICU.  Per daughter, no recent illness.  Unsure if has taken all meds or not.  She does confirm DNR status and confirms that pt is seen by palliative care as outpatient.  Pertinent  Medical History:  has Tobacco abuse; Diabetes mellitus (HCC); Family history of premature CAD; Abnormal LFTs; Hypertension; NICM (nonischemic cardiomyopathy) (HCC); Pulmonary hypertension (HCC); Chronic cholecystitis with calculus; Cerebral thrombosis with cerebral infarction; Acute urinary retention; Cardiogenic shock (HCC); AKI (acute kidney injury) (HCC); Goals of care, counseling/discussion; Palliative care by specialist; Altered mental status; DNR (do not resuscitate); DNI (do not intubate); Encounter for hospice care discussion; Pain; Protein-calorie malnutrition, severe; Heart failure with reduced ejection fraction (HCC); History of CVA (cerebrovascular accident); Nonischemic cardiomyopathy (HCC); Type 2 diabetes mellitus with complication, with long-term current use of insulin (HCC); Tobacco use; Mixed hyperlipidemia; Abnormal echocardiogram; Pericardial effusion; Iron deficiency anemia; History of blood transfusion; Chronic systolic CHF (congestive heart failure) (HCC); Resides in long term  care facility; Acute lower UTI; Urinary tract infection; Failure to thrive in adult; Proteus mirabilis infection; Hypoglycemia; Constipation; Cachexia (HCC); Malnutrition of moderate degree; Hyperkalemia; Severe sepsis (HCC); Sepsis due to pneumonia [facility]); DKA (diabetic ketoacidosis) (HCC); Nausea & vomiting; Dehydration; Chronic diastolic CHF (congestive heart failure) (HCC); Encephalopathy acute; and Metabolic acidosis on their problem list.  Significant Hospital Events: Including procedures, antibiotic start and stop dates in addition to other pertinent events   10/17 > admit. 10/18 continued on insulin gtt 10/19 Insulin gtt weaned overnight  Interim History / Subjective:  Per overnight RN, patient awakens and talks however does not follow commands. Opens eyes and not engaged at bedside  Objective:  Blood pressure 114/75, pulse (!) 103, temperature 99.1 F (37.3 C), temperature source Oral, resp. rate 18, weight 38.9 kg, SpO2 99 %.        Intake/Output Summary (Last 24 hours) at 02/25/2021 0848 Last data filed at 02/25/2021 0248 Gross per 24 hour  Intake 1894.68 ml  Output 2025 ml  Net -130.32 ml   Filed Weights   02/23/21 2108 02/25/21 0400  Weight: 37.1 kg 38.9 kg   Physical Exam: General: Frail, chronically ill-appearing, no acute distress HENT: [place], AT, OP clear, MMM Eyes: EOMI, no scleral icterus Respiratory: Clear to auscultation bilaterally.  No crackles, wheezing or rales Cardiovascular: RRR, -M/R/G, no JVD GI: BS+, soft, nontender Extremities:-Edema,-tenderness Neuro: Opens eyes to voice, PERRL, does not follow commands, moves extremities spontaneously x 4  Labs/imaging personally reviewed:  CT head 10/17 > chronic changes, no acute process.  Assessment & Plan:   Acute DKA  POA unclear what provoked this at this point. Hx DM - per notes, has had some problems with compliance and correct regimen etc.  P: - Transitioned off insulin - Planned for Dobhoff today  and start TF when placement confirmed - Trend BMET q4h, beta-hydroxybutyric acid - Levemir 8U BID ordered and SSI - Will need DM education  Possible sepsis No clear source at this point but can't definitively rule out and still febrile overnight. No Admission bcx with GPC 1 of 3 with contaminant. CXR and UA unremarkable P: - De-escalate Ceftriaxone  - Follow cultures. - Trend fever and WBC - F/u echo  Acute metabolic encephalopathy Likely secondary to DKA and metabolic derangement CT head unremarkable P: - Continue supportive care and treating underlying issue - Hold sedating meds.  AKI - resolved. Normal GFR Suspect pre-renal secondary to volume depletion P: -continue follow renal indices as continue to hydrate -follow UOP  Hx HTN, dCHF, NICM (echo from June 2022 with EF 50-55%, G1DD). - Continue to hold home Clopidogrel, ASA and can resume once mental status improves.  Protein Calorie Malnutrition. - Nutrition consult once mental status improves and can safely take PO.  Hx CVA. - Supportive care, head CT negative for acute findings   Goals of care - palliative care following as outpatient. - Continue current plan / follow up.  Best practice (evaluated daily):  Diet/type: tubefeeds DVT prophylaxis: prophylactic heparin  GI prophylaxis: N/A Lines: Central line Foley:  N/A Code Status:  DNR, confirmed with daughter Karlisha Mathena [phone]) Last date of multidisciplinary goals of care discussion: None yet. Daughter updated via phone 10/18  Labs   CBC: Recent Labs  Lab 02/23/21 1547 02/23/21 2201  WBC 19.1* 18.0*  NEUTROABS 16.3*  --   HGB 11.4* 10.5*  HCT 39.4 35.3*  MCV 105.6* 103.5*  PLT 329 236    Basic Metabolic Panel: Recent Labs  Lab 02/24/21 1500 02/24/21 1800 02/24/21 2232 02/25/21 0234 02/25/21 0618  NA 138 136 137 130* 136  K 4.0 3.9 3.3* 4.1 3.9  CL 107 104 103 98 102  CO2 24 24 24 24 25   GLUCOSE 181* 176* 181* 175* 91  BUN 11  11 7  5* 8  CREATININE 0.97 0.93 0.87 0.79 0.87  CALCIUM 8.7* 8.8* 8.8* 8.3* 8.7*   GFR: Estimated Creatinine Clearance: 42.8 mL/min (by C-G formula based on SCr of 0.87 mg/dL).   HbA1C: Hgb A1c MFr Bld  Date/Time Value Ref Range Status  12/26/2020 09:32 AM 10.1 (H) 4.8 - 5.6 % Final    Comment:    (NOTE) Pre diabetes:          5.7%-6.4%  Diabetes:              >6.4%  Glycemic control for   <7.0% adults with diabetes   10/24/2020 10:51 PM 7.7 (H) 4.8 - 5.6 % Final    Comment:    (NOTE)         Prediabetes: 5.7 - 6.4         Diabetes: >6.4         Glycemic control for adults with diabetes: <7.0     CBG: Recent Labs  Lab 02/24/21 2327 02/25/21 0101 02/25/21 0346 02/25/21 0716 02/25/21 0805  GLUCAP 165* 154* 178* 57* 127*     Critical care time:    The patient is critically ill with multiple organ systems failure and requires high complexity decision making for assessment and support, frequent evaluation and titration of therapies, application of advanced monitoring technologies and extensive interpretation of multiple databases.  Independent Critical Care Time: 45 Minutes.   02/27/21, M.D. [facility] Pulmonary/Critical  Care Medicine 02/25/2021 8:48 AM   Please see Amion for pager number to reach on-call Pulmonary and Critical Care Team.

## 2021-02-25 NOTE — Progress Notes (Signed)
Initial Nutrition Assessment  DOCUMENTATION CODES:   Severe malnutrition in context of chronic illness, Underweight  INTERVENTION:   Ensure Enlive po TID, each supplement provides 350 kcal and 20 grams of protein. Magic cup TID with meals, each supplement provides 290 kcal and 9 grams of protein. MVI with minerals daily.  If oral intake is not adequate and if within patient's goals of care, consider placing Cortrak feeding tube for EN supplementation. Patient is severely malnourished with 16% weight loss in the past 3 months.    NUTRITION DIAGNOSIS:   Severe Malnutrition related to chronic illness (DM with frequent DKA, NICM, CHF) as evidenced by severe muscle depletion, severe fat depletion, percent weight loss (16% weight loss within 3 months).  GOAL:   Patient will meet greater than or equal to 90% of their needs  MONITOR:   Labs, Diet advancement, PO intake  REASON FOR ASSESSMENT:   Consult Enteral/tube feeding initiation and management  ASSESSMENT:   60 yo female admitted with AMS, DKA. PMH includes DM, frequent admissions for DKA, HTN, NICM, pulmonary HTN, chronic cholecystitis with calculus, anemia, CHF, tobacco abuse.  Patient conversed minimally during RD visit. She stated that she was freezing and asked for a blanket, then thanked RD when warm blanket placed on her.   Discussed patient in ICU rounds and with RN today. Off insulin drip today. Holding off on placing Cortrak tube for EN since she swallowed water without difficulty with RN. Diet has been advanced to dysphagia 3 with thin liquids.  On previous admissions, patient has tolerated Ensure Enlive/Plus supplements.   Labs reviewed.  CBG: [id number]  Medications reviewed and include Novolog SSI q 4 hours, Levemir.  Weight history reviewed. Patient weighed 46.4 kg on 11/30/20, now down to 38.9 kg. 16% weight loss within 3 months is severe.  Patient meets criteria for severe malnutrition with 16% weight loss  x 3 months and severe depletion of muscle and subcutaneous fat mass.   NUTRITION - FOCUSED PHYSICAL EXAM:  Flowsheet Row Most Recent Value  Orbital Region Moderate depletion  Upper Arm Region Unable to assess  Thoracic and Lumbar Region Severe depletion  Buccal Region Severe depletion  Temple Region Severe depletion  Clavicle Bone Region Severe depletion  Clavicle and Acromion Bone Region Severe depletion  Scapular Bone Region Moderate depletion  Dorsal Hand Moderate depletion  Patellar Region Severe depletion  Anterior Thigh Region Severe depletion  Posterior Calf Region Severe depletion  Edema (RD Assessment) None  Hair Reviewed  Eyes Reviewed  Mouth Reviewed  Skin Reviewed  Nails Reviewed       Diet Order:   Diet Order             DIET DYS 3 Room service appropriate? No; Fluid consistency: Thin  Diet effective now                   EDUCATION NEEDS:   Not appropriate for education at this time  Skin:  Skin Assessment: Reviewed RN Assessment  Last BM:  no BM documented  Height:   Ht Readings from Last 1 Encounters:  02/25/21 5\' 5"  (1.651 m)    Weight:   Wt Readings from Last 1 Encounters:  02/25/21 38.9 kg    BMI:  Body mass index is 14.27 kg/m.  Estimated Nutritional Needs:   Kcal:  1350-1550  Protein:  60-70 gm  Fluid:  1.4-1.6 L    02/27/21, RD, LDN, CNSC Please refer to Amion for contact information.

## 2021-02-26 DIAGNOSIS — E1111 Type 2 diabetes mellitus with ketoacidosis with coma: Secondary | ICD-10-CM

## 2021-02-26 LAB — BASIC METABOLIC PANEL
Anion gap: 8 (ref 5–15)
Anion gap: 8 (ref 5–15)
BUN: 13 mg/dL (ref 6–20)
BUN: 14 mg/dL (ref 6–20)
CO2: 28 mmol/L (ref 22–32)
CO2: 25 mmol/L (ref 22–32)
Calcium: 8.1 mg/dL — ABNORMAL LOW (ref 8.9–10.3)
Calcium: 8.3 mg/dL — ABNORMAL LOW (ref 8.9–10.3)
Chloride: 101 mmol/L (ref 98–111)
Chloride: 103 mmol/L (ref 98–111)
Creatinine, Ser: 0.73 mg/dL (ref 0.44–1.00)
Creatinine, Ser: 0.78 mg/dL (ref 0.44–1.00)
GFR, Estimated: >60 mL/min (ref >60)
GFR, Estimated: >60 mL/min (ref >60)
Glucose, Bld: 144 mg/dL — ABNORMAL HIGH (ref 70–99)
Glucose, Bld: 223 mg/dL — ABNORMAL HIGH (ref 70–99)
Potassium: 3.6 mmol/L (ref 3.5–5.1)
Potassium: 4.6 mmol/L (ref 3.5–5.1)
Sodium: 137 mmol/L (ref 135–145)
Sodium: 136 mmol/L (ref 135–145)

## 2021-02-26 LAB — RETICULOCYTES
Immature Retic Fract: 8.1 % (ref 2.3–15.9)
RBC.: 2.53 MIL/uL — ABNORMAL LOW (ref 3.87–5.11)
Retic Count, Absolute: 39.5 10*3/uL (ref 19.0–186.0)
Retic Ct Pct: 1.6 % (ref 0.4–3.1)

## 2021-02-26 LAB — GLUCOSE, CAPILLARY
Glucose-Capillary: 120 mg/dL — ABNORMAL HIGH (ref 70–99)
Glucose-Capillary: 292 mg/dL — ABNORMAL HIGH (ref 70–99)
Glucose-Capillary: 322 mg/dL — ABNORMAL HIGH (ref 70–99)
Glucose-Capillary: 254 mg/dL — ABNORMAL HIGH (ref 70–99)
Glucose-Capillary: 235 mg/dL — ABNORMAL HIGH (ref 70–99)
Glucose-Capillary: 116 mg/dL — ABNORMAL HIGH (ref 70–99)

## 2021-02-26 LAB — CBC
HCT: 22.9 % — ABNORMAL LOW (ref 36.0–46.0)
Hemoglobin: 7.7 g/dL — ABNORMAL LOW (ref 12.0–15.0)
MCH: 31.6 pg (ref 26.0–34.0)
MCHC: 33.6 g/dL (ref 30.0–36.0)
MCV: 93.9 fL (ref 80.0–100.0)
Platelets: 144 10*3/uL — ABNORMAL LOW (ref 150–400)
RBC: 2.44 MIL/uL — ABNORMAL LOW (ref 3.87–5.11)
RDW: 14.6 % (ref 11.5–15.5)
WBC: 5.3 10*3/uL (ref 4.0–10.5)
nRBC: 0 % (ref 0.0–0.2)

## 2021-02-26 LAB — CULTURE, BLOOD (ROUTINE X 2): Special Requests: ADEQUATE

## 2021-02-26 LAB — HEMOGLOBIN A1C
Hgb A1c MFr Bld: 13.8 % — ABNORMAL HIGH (ref 4.8–5.6)
Mean Plasma Glucose: 349.36 mg/dL

## 2021-02-26 LAB — VITAMIN B12: Vitamin B-12: 737 pg/mL (ref 180–914)

## 2021-02-26 LAB — PHOSPHORUS
Phosphorus: 2.8 mg/dL (ref 2.5–4.6)
Phosphorus: 1.7 mg/dL — ABNORMAL LOW (ref 2.5–4.6)

## 2021-02-26 LAB — IRON AND TIBC
Iron: 79 ug/dL (ref 28–170)
Saturation Ratios: 48 % — ABNORMAL HIGH (ref 10.4–31.8)
TIBC: 164 ug/dL — ABNORMAL LOW (ref 250–450)
UIBC: 85 ug/dL

## 2021-02-26 LAB — FERRITIN: Ferritin: 423 ng/mL — ABNORMAL HIGH (ref 11–307)

## 2021-02-26 LAB — MAGNESIUM
Magnesium: 2 mg/dL (ref 1.7–2.4)
Magnesium: 2 mg/dL (ref 1.7–2.4)

## 2021-02-26 LAB — TSH: TSH: 1.475 u[IU]/mL (ref 0.350–4.500)

## 2021-02-26 LAB — FOLATE: Folate: 6.5 ng/mL (ref >5.9)

## 2021-02-26 LAB — CORTISOL-AM, BLOOD: Cortisol - AM: 10.4 ug/dL (ref 6.7–22.6)

## 2021-02-26 MED ORDER — SODIUM PHOSPHATES 45 MMOLE/15ML IV SOLN
30.0000 mmol | Freq: Once | INTRAVENOUS | Status: AC
  Administered 2021-02-26: 30 mmol via INTRAVENOUS
  Filled 2021-02-26: qty 10

## 2021-02-26 MED ORDER — INSULIN DETEMIR 100 UNIT/ML ~~LOC~~ SOLN
7.0000 [IU] | Freq: Every day | SUBCUTANEOUS | Status: DC
  Administered 2021-02-26 – 2021-02-28 (×3): 7 [IU] via SUBCUTANEOUS
  Filled 2021-02-26 (×3): qty 0.07

## 2021-02-26 MED ORDER — ATORVASTATIN CALCIUM 40 MG PO TABS
40.0000 mg | ORAL_TABLET | Freq: Every day | ORAL | Status: DC
  Administered 2021-02-26 – 2021-02-28 (×3): 40 mg
  Filled 2021-02-26 (×3): qty 1

## 2021-02-26 MED ORDER — OSMOLITE 1.2 CAL PO LIQD
1000.0000 mL | ORAL | Status: DC
  Filled 2021-02-26: qty 1000

## 2021-02-26 MED ORDER — POTASSIUM CHLORIDE 20 MEQ PO PACK
40.0000 meq | PACK | Freq: Once | ORAL | Status: AC
  Administered 2021-02-26: 40 meq
  Filled 2021-02-26: qty 2

## 2021-02-26 MED ORDER — SENNA 8.6 MG PO TABS
1.0000 | ORAL_TABLET | Freq: Every day | ORAL | Status: DC
  Administered 2021-02-26 – 2021-02-27 (×2): 8.6 mg
  Filled 2021-02-26 (×2): qty 1

## 2021-02-26 MED ORDER — INSULIN ASPART 100 UNIT/ML IJ SOLN
0.0000 [IU] | INTRAMUSCULAR | Status: DC
  Administered 2021-02-26: 3 [IU] via SUBCUTANEOUS
  Administered 2021-02-26: 5 [IU] via SUBCUTANEOUS
  Administered 2021-02-26: 7 [IU] via SUBCUTANEOUS
  Administered 2021-02-27 (×2): 1 [IU] via SUBCUTANEOUS
  Administered 2021-02-27: 5 [IU] via SUBCUTANEOUS
  Administered 2021-02-27 (×2): 1 [IU] via SUBCUTANEOUS
  Administered 2021-02-28: 2 [IU] via SUBCUTANEOUS
  Administered 2021-02-28: 5 [IU] via SUBCUTANEOUS
  Administered 2021-02-28: 3 [IU] via SUBCUTANEOUS
  Administered 2021-02-28: 5 [IU] via SUBCUTANEOUS
  Administered 2021-03-01: 3 [IU] via SUBCUTANEOUS
  Administered 2021-03-01 – 2021-03-02 (×4): 2 [IU] via SUBCUTANEOUS
  Administered 2021-03-02: 5 [IU] via SUBCUTANEOUS
  Administered 2021-03-03: 1 [IU] via SUBCUTANEOUS
  Administered 2021-03-03 (×2): 2 [IU] via SUBCUTANEOUS
  Administered 2021-03-03 – 2021-03-04 (×2): 1 [IU] via SUBCUTANEOUS
  Administered 2021-03-04: 3 [IU] via SUBCUTANEOUS

## 2021-02-26 MED ORDER — ADULT MULTIVITAMIN W/MINERALS CH
1.0000 | ORAL_TABLET | Freq: Every day | ORAL | Status: DC
  Administered 2021-02-26 – 2021-03-01 (×4): 1
  Filled 2021-02-26 (×4): qty 1

## 2021-02-26 MED ORDER — METOPROLOL TARTRATE 12.5 MG HALF TABLET
12.5000 mg | ORAL_TABLET | Freq: Two times a day (BID) | ORAL | Status: DC
  Administered 2021-02-26 – 2021-02-27 (×3): 12.5 mg
  Filled 2021-02-26 (×4): qty 1

## 2021-02-26 MED ORDER — DOCUSATE SODIUM 50 MG/5ML PO LIQD
100.0000 mg | Freq: Two times a day (BID) | ORAL | Status: DC
  Administered 2021-02-26 – 2021-02-28 (×4): 100 mg
  Filled 2021-02-26 (×8): qty 10

## 2021-02-26 NOTE — Progress Notes (Signed)
Inpatient Diabetes Program Recommendations  AACE/ADA: New Consensus Statement on Inpatient Glycemic Control   Target Ranges:  Prepandial:   less than 140 mg/dL      Peak postprandial:   less than 180 mg/dL (1-2 hours)      Critically ill patients:  140 - 180 mg/dL   Results for TOBIE, PERDUE (MRN [id number]) as of 02/26/2021 14:17  Ref. Range 02/26/2021 03:32 02/26/2021 08:28 02/26/2021 11:10  Glucose-Capillary Latest Ref Range: 70 - 99 mg/dL 263 (H) 335 (H) 456 (H)  Novolog 7 units  Results for LANETTE, ELL (MRN [id number]) as of 02/26/2021 14:17  Ref. Range 02/25/2021 08:05 02/25/2021 11:03 02/25/2021 16:20 02/25/2021 17:37 02/25/2021 19:42 02/25/2021 23:31  Glucose-Capillary Latest Ref Range: 70 - 99 mg/dL 428 (H)     Levemir 8 units 104 (H) 70 126 (H) 233 (H)  Novolog 6 units  Levemir 8 units@21 :45 203 (H)  Novolog 6 units    Review of Glycemic Control  Diabetes history: DM Outpatient Diabetes medications: Lantus 10 units BID, Novolog 5 units TID with meals Current orders for Inpatient glycemic control: Novolog 0-9 units Q4H  Inpatient Diabetes Program Recommendations:    Insulin: Please consider ordering Semglee 7 units Q24H.  Thanks, Orlando Penner, RN, MSN, CDE Diabetes Coordinator Inpatient Diabetes Program [phone] (Team Pager from 8am to 5pm)

## 2021-02-26 NOTE — Progress Notes (Addendum)
Small raised area (maybe cyst or inflamed pore?) with clear drainage noted on Left groin. Covered with foam drsg. Seen by Dr Sunnie Nielsen. No further orders received at this time.

## 2021-02-26 NOTE — Progress Notes (Addendum)
PROGRESS NOTE    Christine Benitez  XTG:[id number] DOB: 03-14-1961 DOA: 02/23/2021 PCP: Lupita Dawn, MD   Brief Narrative: 60 year old with past medical history significant for diabetes, hypertension, nonischemic cardiomyopathy, pulmonary hypertension, CVA, history of cardiogenic shock, AKI, encephalopathy, cachexia, failure to thrive multiple admissions for DKA.  Presents to [facility], ED 10/17 with altered mental status and hyperglycemia.  In the ED patient was altered, CO2 less than 7, glucose 486, lactic acid 3.7, beta hydroxybutyric acid more than 8.  She was given 2 L of LR and insulin infusion.  Patient was admitted by CCM.  Care transferred to [facility] 30/20/2022.  Family confirmed DNR status.  Patient has been followed by palliative care as an outpatient.  Patient was started on tube feeding on 10/19 due to poor oral intake.  Palliative care consulted for goals of care.   Assessment & Plan:   Active Problems:   DKA (diabetic ketoacidosis) (HCC)   Encephalopathy acute   Metabolic acidosis  1-DKA: Unclear if patient has been using insulin. She was transitioned to Levemir 8 units twice daily with subsequently discontinued due to CBG in the 70s.  Will resume Levemir 7 units daily Daily with a sliding scale insulin Ac 13.   2-Possible sepsis: 1-3 Blood culture grew Micococcus species ? Contaminate.  Chest x ray: Atelectasis at the bases. UA: Negative white blood cell Urine culture was not sent. She did have some folliculitis in the pubic area, that was draining purulent secretion.  Continue with IV antibiotics  Anemia; probably anemia of acute illness and chronic disease B12: 737, iron 79, ferritin 423.  Folic acid 6.5 Follow trend.  Acute metabolic encephalopathy: Status likely related to DKA and metabolic derangement.   AKI: Resolved with IV fluid  Diastolic heart failure, nonischemic cardiomyopathy: Holding aspirin and Plavix today due to drop in hemoglobin Severe  protein caloric malnutrition: See below She was a started on tube feedings in the ICU.  Plan to consult palliative care for goals of care  History of CVA:   Nutrition Problem: Severe Malnutrition Etiology: chronic illness (DM with frequent DKA, NICM, CHF)    Signs/Symptoms: severe muscle depletion, severe fat depletion, percent weight loss (16% weight loss within 3 months) Percent weight loss: 16 %    Interventions: Refer to RD note for recommendations  Estimated body mass index is 14.16 kg/m as calculated from the following:   Height as of this encounter: 5\' 5"  (1.651 m).   Weight as of this encounter: 38.6 kg.   DVT prophylaxis: Heparin Code Status: DNR Family Communication: Daughter over phone.  Disposition Plan:  Status is: Inpatient  Remains inpatient appropriate because:  DKA hyperglycemia        Consultants:  CCM admitted patient.  Palliative care.   Procedures:    Antimicrobials:    Subjective: Alert, slow in answering questions. Denies pain.   Objective: Vitals:   02/26/21 0300 02/26/21 0355 02/26/21 0400 02/26/21 0500  BP: 104/62  104/62 91/77  Pulse: 95  85 97  Resp: 18  15 14   Temp:  98.8 F (37.1 C)    TempSrc:  Oral    SpO2: 99%  100% 100%  Weight:    38.6 kg  Height:        Intake/Output Summary (Last 24 hours) at 02/26/2021 0814 Last data filed at 02/26/2021 0400 Gross per 24 hour  Intake 919.08 ml  Output 950 ml  Net -30.92 ml   Filed Weights   02/23/21 2108 02/25/21 0400  02/26/21 0500  Weight: 37.1 kg 38.9 kg 38.6 kg    Examination:  General exam: Appears calm and comfortable frail cachetic.  Respiratory system: Clear to auscultation. Respiratory effort normal. Cardiovascular system: S1 & S2 heard, RRR.  Gastrointestinal system: Abdomen is nondistended, soft and nontender. No organomegaly or masses felt. Normal bowel sounds heard. Central nervous system: Alert  Extremities: no edema   Data Reviewed: I have  personally reviewed following labs and imaging studies  CBC: Recent Labs  Lab 02/23/21 1547 02/23/21 2201 02/26/21 0419  WBC 19.1* 18.0* 5.3  NEUTROABS 16.3*  --   --   HGB 11.4* 10.5* 7.7*  HCT 39.4 35.3* 22.9*  MCV 105.6* 103.5* 93.9  PLT 329 236 144*   Basic Metabolic Panel: Recent Labs  Lab 02/25/21 0618 02/25/21 1016 02/25/21 1535 02/25/21 1746 02/25/21 2156 02/26/21 0419  NA 136 137 137 138  --  137  K 3.9 3.8 3.8 3.4*  --  3.6  CL 102 104 106 102  --  101  CO2 --  28  GLUCOSE 91 108* 83 128*  --  144*  BUN --  13  CREATININE 0.87 0.79 0.79 0.75  --  0.73  CALCIUM 8.7* 8.4* 8.3* 8.0*  --  8.1*  MG  --  1.2*  --  2.0  --  2.0  PHOS  --  <1.0*  --  2.1* 4.6 2.8   GFR: Estimated Creatinine Clearance: 46.1 mL/min (by C-G formula based on SCr of 0.73 mg/dL). Liver Function Tests: Recent Labs  Lab 02/23/21 1547  AST 15  ALT 9  ALKPHOS 61  BILITOT 1.8*  PROT 7.3  ALBUMIN 3.7   No results for input(s): LIPASE, AMYLASE in the last 168 hours. No results for input(s): AMMONIA in the last 168 hours. Coagulation Profile: No results for input(s): INR, PROTIME in the last 168 hours. Cardiac Enzymes: No results for input(s): CKTOTAL, CKMB, CKMBINDEX, TROPONINI in the last 168 hours. BNP (last 3 results) No results for input(s): PROBNP in the last 8760 hours. HbA1C: No results for input(s): HGBA1C in the last 72 hours. CBG: Recent Labs  Lab 02/25/21 1620 02/25/21 1737 02/25/21 1942 02/25/21 2331 02/26/21 0332  GLUCAP 70 126* 233* 203* 120*   Lipid Profile: No results for input(s): CHOL, HDL, LDLCALC, TRIG, CHOLHDL, LDLDIRECT in the last 72 hours. Thyroid Function Tests: No results for input(s): TSH, T4TOTAL, FREET4, T3FREE, THYROIDAB in the last 72 hours. Anemia Panel: No results for input(s): VITAMINB12, FOLATE, FERRITIN, TIBC, IRON, RETICCTPCT in the last 72 hours. Sepsis Labs: Recent Labs  Lab 02/23/21 1555 02/24/21 0700   LATICACIDVEN 3.7* 1.9    Recent Results (from the past 240 hour(s))  Blood culture (routine x 2)     Status: None (Preliminary result)   Collection Time: 02/23/21  5:28 PM   Specimen: BLOOD  Result Value Ref Range Status   Specimen Description BLOOD CENTRAL LINE  Final   Special Requests   Final    BOTTLES DRAWN AEROBIC AND ANAEROBIC Blood Culture adequate volume   Culture  Setup Time   Final    GRAM POSITIVE COCCI IN CLUSTERS AEROBIC BOTTLE ONLY CRITICAL RESULT CALLED TO, READ BACK BY AND VERIFIED WITH: Tyrell Antonio AMEND [id number] FCP Performed at [facility], [address], [place], [place] [address]    Culture [facility] POSITIVE COCCI  Final   Report Status PENDING  Incomplete  Blood Culture  ID Panel (Reflexed)     Status: None   Collection Time: 02/23/21  5:28 PM  Result Value Ref Range Status   Enterococcus faecalis NOT DETECTED NOT DETECTED Final   Enterococcus Faecium NOT DETECTED NOT DETECTED Final   Listeria monocytogenes NOT DETECTED NOT DETECTED Final   Staphylococcus species NOT DETECTED NOT DETECTED Final   Staphylococcus aureus (BCID) NOT DETECTED NOT DETECTED Final   Staphylococcus epidermidis NOT DETECTED NOT DETECTED Final   Staphylococcus lugdunensis NOT DETECTED NOT DETECTED Final   Streptococcus species NOT DETECTED NOT DETECTED Final   Streptococcus agalactiae NOT DETECTED NOT DETECTED Final   Streptococcus pneumoniae NOT DETECTED NOT DETECTED Final   Streptococcus pyogenes NOT DETECTED NOT DETECTED Final   A.calcoaceticus-baumannii NOT DETECTED NOT DETECTED Final   Bacteroides fragilis NOT DETECTED NOT DETECTED Final   Enterobacterales NOT DETECTED NOT DETECTED Final   Enterobacter cloacae complex NOT DETECTED NOT DETECTED Final   Escherichia coli NOT DETECTED NOT DETECTED Final   Klebsiella aerogenes NOT DETECTED NOT DETECTED Final   Klebsiella oxytoca NOT DETECTED NOT DETECTED Final   Klebsiella pneumoniae NOT DETECTED NOT DETECTED Final   Proteus  species NOT DETECTED NOT DETECTED Final   Salmonella species NOT DETECTED NOT DETECTED Final   Serratia marcescens NOT DETECTED NOT DETECTED Final   Haemophilus influenzae NOT DETECTED NOT DETECTED Final   Neisseria meningitidis NOT DETECTED NOT DETECTED Final   Pseudomonas aeruginosa NOT DETECTED NOT DETECTED Final   Stenotrophomonas maltophilia NOT DETECTED NOT DETECTED Final   Candida albicans NOT DETECTED NOT DETECTED Final   Candida auris NOT DETECTED NOT DETECTED Final   Candida glabrata NOT DETECTED NOT DETECTED Final   Candida krusei NOT DETECTED NOT DETECTED Final   Candida parapsilosis NOT DETECTED NOT DETECTED Final   Candida tropicalis NOT DETECTED NOT DETECTED Final   Cryptococcus neoformans/gattii NOT DETECTED NOT DETECTED Final    Comment: Performed at [facility], [address] [place], [place] [address]  Resp Panel by RT-PCR (Flu A&B, Covid) Nasopharyngeal Swab     Status: None   Collection Time: 02/23/21  8:24 PM   Specimen: Nasopharyngeal Swab; Nasopharyngeal(NP) swabs in vial transport medium  Result Value Ref Range Status   SARS Coronavirus 2 by RT PCR NEGATIVE NEGATIVE Final    Comment: (NOTE) SARS-CoV-2 target nucleic acids are NOT DETECTED.  The SARS-CoV-2 RNA is generally detectable in upper respiratory specimens during the acute phase of infection. The lowest concentration of SARS-CoV-2 viral copies this assay can detect is 138 copies/mL. A negative result does not preclude SARS-Cov-2 infection and should not be used as the sole basis for treatment or other patient management decisions. A negative result may occur with  improper specimen collection/handling, submission of specimen other than nasopharyngeal swab, presence of viral mutation(s) within the areas targeted by this assay, and inadequate number of viral copies(<138 copies/mL). A negative result must be combined with clinical observations, patient history, and epidemiological information.  The expected result is Negative.  Fact Sheet for Patients:  BloggerCourse.com  Fact Sheet for Healthcare Providers:  SeriousBroker.it  This test is no t yet approved or cleared by the [place] FDA and  has been authorized for detection and/or diagnosis of SARS-CoV-2 by FDA under an Emergency Use Authorization (EUA). This EUA will remain  in effect (meaning this test can be used) for the duration of the COVID-19 declaration under Section 564(b)(1) of the Act, 21 U.S.C.section 360bbb-3(b)(1), unless the authorization is terminated  or revoked sooner.  Influenza A by PCR NEGATIVE NEGATIVE Final   Influenza B by PCR NEGATIVE NEGATIVE Final    Comment: (NOTE) The Xpert Xpress SARS-CoV-2/FLU/RSV plus assay is intended as an aid in the diagnosis of influenza from Nasopharyngeal swab specimens and should not be used as a sole basis for treatment. Nasal washings and aspirates are unacceptable for Xpert Xpress SARS-CoV-2/FLU/RSV testing.  Fact Sheet for Patients: BloggerCourse.com  Fact Sheet for Healthcare Providers: SeriousBroker.it  This test is not yet approved or cleared by the [place] FDA and has been authorized for detection and/or diagnosis of SARS-CoV-2 by FDA under an Emergency Use Authorization (EUA). This EUA will remain in effect (meaning this test can be used) for the duration of the COVID-19 declaration under Section 564(b)(1) of the Act, 21 U.S.C. section 360bbb-3(b)(1), unless the authorization is terminated or revoked.  Performed at [facility], [address], [place], [place] [address]   MRSA Next Gen by PCR, Nasal     Status: None   Collection Time: 02/23/21 11:37 PM   Specimen: Nasal Mucosa; Nasal Swab  Result Value Ref Range Status   MRSA by PCR Next Gen NOT DETECTED NOT DETECTED Final    Comment: (NOTE) The GeneXpert MRSA Assay (FDA  approved for NASAL specimens only), is one component of a comprehensive MRSA colonization surveillance program. It is not intended to diagnose MRSA infection nor to guide or monitor treatment for MRSA infections. Test performance is not FDA approved in patients less than 43 years old. Performed at [facility], [address] [place], [place] [address]   Blood culture (routine x 2)     Status: None (Preliminary result)   Collection Time: 02/24/21  2:04 AM   Specimen: BLOOD LEFT HAND  Result Value Ref Range Status   Specimen Description BLOOD LEFT HAND  Final   Special Requests   Final    BOTTLES DRAWN AEROBIC ONLY Blood Culture adequate volume   Culture   Final    NO GROWTH 1 DAY Performed at [facility], [address], [place], [place] [address]    Report Status PENDING  Incomplete         Radiology Studies: DG CHEST PORT 1 VIEW  Result Date: 02/24/2021 CLINICAL DATA:  Fever EXAM: PORTABLE CHEST 1 VIEW COMPARISON:  02/23/2021 FINDINGS: Central line unchanged with the tip in the SVC above the right atrium. Slight increase in atelectasis at both lung bases. Upper lungs remain clear. No visible effusion. IMPRESSION: Slight worsening of atelectasis at the lung bases. Electronically Signed   By: Paulina Fusi M.D.   On: 02/24/2021 12:39   DG Abd Portable 1V  Result Date: 02/25/2021 CLINICAL DATA:  Feeding tube placement. EXAM: PORTABLE ABDOMEN - 1 VIEW COMPARISON:  CT chest, abdomen, and pelvis 11/25/2020. Abdominal radiograph 10/24/2020. FINDINGS: A feeding tube has been placed with tip at the level of the duodenal bulb. There is mild gaseous distension of the stomach. A moderately large amount of stool is present throughout the colon. No dilated loops of bowel are seen to suggest obstruction. No acute osseous abnormality is seen. IMPRESSION: Feeding tube terminate at the level of the duodenal bulb. Electronically Signed   By: Sebastian Ache M.D.   On: 02/25/2021 15:00    ECHOCARDIOGRAM LIMITED  Result Date: 02/25/2021    ECHOCARDIOGRAM LIMITED REPORT   Patient Name:   Christine Benitez Date of Exam: 02/25/2021 Medical Rec #:  [id number]  Height:       65.0 in Accession #:    [id number]      Weight:       85.8 lb Date of Birth:  03/03/1961       BSA:          1.380 m Patient Age:    60 years        BP:           109/69 mmHg Patient Gender: F               HR:           97 bpm. Exam Location:  Inpatient Procedure: Limited Echo, Cardiac Doppler, Color Doppler and 3D Echo Indications:    Fever  History:        Patient has prior history of Echocardiogram examinations, most                 recent 10/25/2020. CHF and Cardiomyopathy, Abnormal ECG, Stroke,                 Signs/Symptoms:Bacteremia and Altered Mental Status; Risk                 Factors:Current Smoker, Hypertension and Diabetes. Cardiac                 shock.  Sonographer:    Sheralyn Boatman RDCS Referring Phys: [id number] LAURA R GLEASON IMPRESSIONS  1. Left ventricular ejection fraction, by estimation, is 45 to 50%. The left ventricle has mildly decreased function. The left ventricle demonstrates global hypokinesis. There is severe concentric left ventricular hypertrophy. Left ventricular diastolic  parameters are indeterminate. The average left ventricular global longitudinal strain is -13.8 %. The global longitudinal strain is abnormal.  2. Right ventricular systolic function is normal. The right ventricular size is normal.  3. The mitral valve is normal in structure. Mild mitral valve regurgitation. No evidence of mitral stenosis.  4. The aortic valve is normal in structure. Aortic valve regurgitation is not visualized. Mild aortic valve sclerosis is present, with no evidence of aortic valve stenosis.  5. The inferior vena cava is normal in size with greater than 50% respiratory variability, suggesting right atrial pressure of 3 mmHg. FINDINGS  Left Ventricle: Left ventricular ejection fraction, by estimation, is 45 to  50%. The left ventricle has mildly decreased function. The left ventricle demonstrates global hypokinesis. The average left ventricular global longitudinal strain is -13.8 %. The global longitudinal strain is abnormal. The left ventricular internal cavity size was small. There is severe concentric left ventricular hypertrophy. Left ventricular diastolic parameters are indeterminate. Right Ventricle: The right ventricular size is normal. No increase in right ventricular wall thickness. Right ventricular systolic function is normal. Left Atrium: Left atrial size was normal in size. Right Atrium: Right atrial size was normal in size. Pericardium: There is no evidence of pericardial effusion. Mitral Valve: The mitral valve is normal in structure. Mild mitral valve regurgitation. No evidence of mitral valve stenosis. Tricuspid Valve: The tricuspid valve is normal in structure. Tricuspid valve regurgitation is trivial. No evidence of tricuspid stenosis. Aortic Valve: The aortic valve is normal in structure. Aortic valve regurgitation is not visualized. Mild aortic valve sclerosis is present, with no evidence of aortic valve stenosis. Pulmonic Valve: The pulmonic valve was normal in structure. Pulmonic valve regurgitation is not visualized. No evidence of pulmonic stenosis. Aorta: The aortic root is normal in size and structure. Venous: The inferior vena cava is normal in size  with greater than 50% respiratory variability, suggesting right atrial pressure of 3 mmHg. IAS/Shunts: No atrial level shunt detected by color flow Doppler. LEFT VENTRICLE PLAX 2D LVIDd:         3.10 cm LVIDs:         2.50 cm     2D Longitudinal Strain LV PW:         1.70 cm     2D Strain GLS Avg:     -13.8 % LV IVS:        1.60 cm  LV Volumes (MOD)           3D Volume EF: LV vol d, MOD A2C: 55.8 ml 3D EF:        47 % LV vol d, MOD A4C: 46.9 ml LV EDV:       88 ml LV vol s, MOD A2C: 27.6 ml LV ESV:       46 ml LV vol s, MOD A4C: 26.8 ml LV SV:         41 ml LV SV MOD A2C:     28.2 ml LV SV MOD A4C:     46.9 ml LV SV MOD BP:      25.6 ml LEFT ATRIUM         Index LA diam:    2.10 cm 1.52 cm/m   AORTA Ao Root diam: 2.60 cm Kardie Tobb DO Electronically signed by Thomasene Ripple DO Signature Date/Time: 02/25/2021/3:13:12 PM    Final         Scheduled Meds:  atorvastatin  40 mg Oral QHS   Chlorhexidine Gluconate Cloth  6 each Topical Daily   heparin  5,000 Units Subcutaneous Q8H   insulin aspart  2-6 Units Subcutaneous Q4H   insulin detemir  8 Units Subcutaneous Q12H   mouth rinse  15 mL Mouth Rinse BID   metoprolol tartrate  12.5 mg Oral BID   multivitamin with minerals  1 tablet Oral Daily   sodium chloride flush  10-40 mL Intracatheter Q12H   Continuous Infusions:  cefTRIAXone (ROCEPHIN)  IV Stopped (02/25/21 1050)   feeding supplement (OSMOLITE 1.2 CAL) 1,000 mL (02/26/21 0535)     LOS: 3 days    Time spent: 35 minutes.     Alba Cory, MD [facility]   If 7PM-7AM, please contact night-coverage [email or web address]  02/26/2021, 8:14 AM

## 2021-02-27 ENCOUNTER — Inpatient Hospital Stay (HOSPITAL_COMMUNITY): Payer: Medicaid Other

## 2021-02-27 DIAGNOSIS — Z515 Encounter for palliative care: Secondary | ICD-10-CM

## 2021-02-27 LAB — CBC
HCT: 23.2 % — ABNORMAL LOW (ref 36.0–46.0)
Hemoglobin: 7.5 g/dL — ABNORMAL LOW (ref 12.0–15.0)
MCH: 30.7 pg (ref 26.0–34.0)
MCHC: 32.3 g/dL (ref 30.0–36.0)
MCV: 95.1 fL (ref 80.0–100.0)
Platelets: 150 10*3/uL (ref 150–400)
RBC: 2.44 MIL/uL — ABNORMAL LOW (ref 3.87–5.11)
RDW: 14.7 % (ref 11.5–15.5)
WBC: 5 10*3/uL (ref 4.0–10.5)
nRBC: 0 % (ref 0.0–0.2)

## 2021-02-27 LAB — GLUCOSE, CAPILLARY
Glucose-Capillary: 138 mg/dL — ABNORMAL HIGH (ref 70–99)
Glucose-Capillary: 122 mg/dL — ABNORMAL HIGH (ref 70–99)
Glucose-Capillary: 271 mg/dL — ABNORMAL HIGH (ref 70–99)
Glucose-Capillary: 145 mg/dL — ABNORMAL HIGH (ref 70–99)
Glucose-Capillary: 147 mg/dL — ABNORMAL HIGH (ref 70–99)

## 2021-02-27 LAB — MAGNESIUM: Magnesium: 1.9 mg/dL (ref 1.7–2.4)

## 2021-02-27 LAB — PHOSPHORUS: Phosphorus: 5 mg/dL — ABNORMAL HIGH (ref 2.5–4.6)

## 2021-02-27 MED ORDER — SODIUM CHLORIDE 0.9 % IV SOLN
2.0000 g | INTRAVENOUS | Status: AC
  Administered 2021-02-28 – 2021-03-02 (×3): 2 g via INTRAVENOUS
  Filled 2021-02-27 (×3): qty 20

## 2021-02-27 MED ORDER — IOHEXOL 350 MG/ML SOLN
80.0000 mL | Freq: Once | INTRAVENOUS | Status: AC | PRN
  Administered 2021-02-27: 80 mL via INTRAVENOUS

## 2021-02-27 MED ORDER — OSMOLITE 1.2 CAL PO LIQD
1000.0000 mL | ORAL | Status: DC
  Administered 2021-02-27: 1000 mL
  Filled 2021-02-27 (×2): qty 1000

## 2021-02-27 MED ORDER — SODIUM CHLORIDE 0.9 % IV BOLUS
1000.0000 mL | Freq: Once | INTRAVENOUS | Status: AC
  Administered 2021-02-27: 1000 mL via INTRAVENOUS

## 2021-02-27 MED ORDER — ACETAMINOPHEN 325 MG PO TABS
650.0000 mg | ORAL_TABLET | Freq: Four times a day (QID) | ORAL | Status: DC | PRN
  Administered 2021-02-27 – 2021-03-03 (×2): 650 mg via ORAL
  Filled 2021-02-27 (×2): qty 2

## 2021-02-27 MED ORDER — METRONIDAZOLE 500 MG/100ML IV SOLN
500.0000 mg | Freq: Two times a day (BID) | INTRAVENOUS | Status: DC
  Administered 2021-02-27 – 2021-03-04 (×10): 500 mg via INTRAVENOUS
  Filled 2021-02-27 (×11): qty 100

## 2021-02-27 NOTE — Progress Notes (Signed)
PROGRESS NOTE    Christine Benitez  MBW:[id number] DOB: 1960/10/26 DOA: 02/23/2021 PCP: Lupita Dawn, MD   Brief Narrative: 60 year old with past medical history significant for diabetes, hypertension, nonischemic cardiomyopathy, pulmonary hypertension, CVA, history of cardiogenic shock, AKI, encephalopathy, cachexia, failure to thrive multiple admissions for DKA.  Presents to [facility], ED 10/17 with altered mental status and hyperglycemia.  In the ED patient was altered, CO2 less than 7, glucose 486, lactic acid 3.7, beta hydroxybutyric acid more than 8.  She was given 2 L of LR and insulin infusion.  Patient was admitted by CCM.  Care transferred to [facility] 30/20/2022.  Family confirmed DNR status.  Patient has been followed by palliative care as an outpatient.  Patient was started on tube feeding on 10/19 due to poor oral intake.  Palliative care consulted for goals of care.   Assessment & Plan:   Active Problems:   DKA (diabetic ketoacidosis) (HCC)   Encephalopathy acute   Metabolic acidosis  1-DKA: Unclear if patient has been using insulin. She was transitioned to Levemir 8 units twice daily with subsequently discontinued due to CBG in the 70s.  Will resume Levemir 7 units daily Daily with a sliding scale insulin Ac 13.   2-Possible sepsis: 1-3 Blood culture grew Micococcus species ? Contaminate.  Chest x ray: Atelectasis at the bases. UA: Negative white blood cell Urine culture was not sent. She did have some folliculitis in the pubic area, that was draining purulent secretion. Plan to check CT pelvis to further.  Continue with IV antibiotics Will continue with Ceftriaxone.   Anemia; probably anemia of acute illness and chronic disease B12: 737, iron 79, ferritin 423.  Folic acid 6.5 Follow trend. Blood transfusion for hb less than 7  Acute metabolic encephalopathy: Status likely related to DKA and metabolic derangement.  Acute on chronic.   AKI: Resolved with IV  fluid  Diastolic heart failure, nonischemic cardiomyopathy: Holding aspirin and Plavix today due to drop in hemoglobin Severe protein caloric malnutrition: See below She was a started on tube feedings in the ICU.  Plan to consult palliative care for goals of care  History of CVA: holding aspirin/plavix due to anemia   Nutrition Problem: Severe Malnutrition Etiology: chronic illness (DM with frequent DKA, NICM, CHF)    Signs/Symptoms: severe muscle depletion, severe fat depletion, percent weight loss (16% weight loss within 3 months) Percent weight loss: 16 %    Interventions: Tube feeding, Ensure Enlive (each supplement provides 350kcal and 20 grams of protein), MVI, Magic cup  Estimated body mass index is 14.78 kg/m as calculated from the following:   Height as of this encounter: 5\' 5"  (1.651 m).   Weight as of this encounter: 40.3 kg.   DVT prophylaxis: Heparin Code Status: DNR Family Communication: Daughter over phone 10/20 Disposition Plan:  Status is: Inpatient  Remains inpatient appropriate because:  DKA hyperglycemia        Consultants:  CCM admitted patient.  Palliative care.   Procedures:    Antimicrobials:    Subjective: Alert, not answering questions today    Objective: Vitals:   02/27/21 0452 02/27/21 0800 02/27/21 0905 02/27/21 0920  BP:   (!) 110/58 112/63  Pulse:  (!) 102 (!) 107 100  Resp:  16  18  Temp:    99.8 F (37.7 C)  TempSrc:    Oral  SpO2:  100%  100%  Weight: 40.3 kg     Height: 5\' 5"  (1.651 m)  Intake/Output Summary (Last 24 hours) at 02/27/2021 1440 Last data filed at 02/26/2021 2343 Gross per 24 hour  Intake --  Output 450 ml  Net -450 ml    Filed Weights   02/25/21 0400 02/26/21 0500 02/27/21 0452  Weight: 38.9 kg 38.6 kg 40.3 kg    Examination:  General exam: Frail, cachectic Respiratory system: Clear to auscultation Cardiovascular system: S1, S2 regular rhythm or rate Gastrointestinal system:  Bowel sounds present, soft nontender nondistended Central nervous system: Alert, not very interactive today Extremities: no edema   Data Reviewed: I have personally reviewed following labs and imaging studies  CBC: Recent Labs  Lab 02/23/21 1547 02/23/21 2201 02/26/21 0419 02/27/21 0143  WBC 19.1* 18.0* 5.3 5.0  NEUTROABS 16.3*  --   --   --   HGB 11.4* 10.5* 7.7* 7.5*  HCT 39.4 35.3* 22.9* 23.2*  MCV 105.6* 103.5* 93.9 95.1  PLT 329 236 144* 150    Basic Metabolic Panel: Recent Labs  Lab 02/25/21 1016 02/25/21 1535 02/25/21 1746 02/25/21 2156 02/26/21 0419 02/26/21 1600 02/27/21 0143  NA 137 137 138  --  137 136  --   K 3.8 3.8 3.4*  --  3.6 4.6  --   CL 104 106 102  --  101 103  --   CO2 27 25 24   --  28 25  --   GLUCOSE 108* 83 128*  --  144* 223*  --   BUN 6 8 8   --  13 14  --   CREATININE 0.79 0.79 0.75  --  0.73 0.78  --   CALCIUM 8.4* 8.3* 8.0*  --  8.1* 8.3*  --   MG 1.2*  --  2.0  --  2.0 2.0 1.9  PHOS <1.0*  --  2.1* 4.6 2.8 1.7* 5.0*    GFR: Estimated Creatinine Clearance: 48.2 mL/min (by C-G formula based on SCr of 0.78 mg/dL). Liver Function Tests: Recent Labs  Lab 02/23/21 1547  AST 15  ALT 9  ALKPHOS 61  BILITOT 1.8*  PROT 7.3  ALBUMIN 3.7    No results for input(s): LIPASE, AMYLASE in the last 168 hours. No results for input(s): AMMONIA in the last 168 hours. Coagulation Profile: No results for input(s): INR, PROTIME in the last 168 hours. Cardiac Enzymes: No results for input(s): CKTOTAL, CKMB, CKMBINDEX, TROPONINI in the last 168 hours. BNP (last 3 results) No results for input(s): PROBNP in the last 8760 hours. HbA1C: Recent Labs    02/26/21 0836  HGBA1C 13.8*   CBG: Recent Labs  Lab 02/26/21 1736 02/26/21 2030 02/26/21 2326 02/27/21 0440 02/27/21 1130  GLUCAP 254* 235* 116* 138* 271*    Lipid Profile: No results for input(s): CHOL, HDL, LDLCALC, TRIG, CHOLHDL, LDLDIRECT in the last 72 hours. Thyroid Function  Tests: Recent Labs    02/26/21 0836  TSH 1.475   Anemia Panel: Recent Labs    02/26/21 0836  VITAMINB12 737  FOLATE 6.5  FERRITIN 423*  TIBC 164*  IRON 79  RETICCTPCT 1.6   Sepsis Labs: Recent Labs  Lab 02/23/21 1555 02/24/21 0700  LATICACIDVEN 3.7* 1.9     Recent Results (from the past 240 hour(s))  Blood culture (routine x 2)     Status: Abnormal   Collection Time: 02/23/21  5:28 PM   Specimen: BLOOD  Result Value Ref Range Status   Specimen Description BLOOD CENTRAL LINE  Final   Special Requests   Final    BOTTLES DRAWN  AEROBIC AND ANAEROBIC Blood Culture adequate volume   Culture  Setup Time   Final    GRAM POSITIVE COCCI IN CLUSTERS AEROBIC BOTTLE ONLY CRITICAL RESULT CALLED TO, READ BACK BY AND VERIFIED WITH: PHARMD C AMEND [id number] FCP    Culture (A)  Final    MICROCOCCUS SPECIES Standardized susceptibility testing for this organism is not available. Performed at [facility], [address], [place], [place] [address]    Report Status 02/26/2021 FINAL  Final  Blood Culture ID Panel (Reflexed)     Status: None   Collection Time: 02/23/21  5:28 PM  Result Value Ref Range Status   Enterococcus faecalis NOT DETECTED NOT DETECTED Final   Enterococcus Faecium NOT DETECTED NOT DETECTED Final   Listeria monocytogenes NOT DETECTED NOT DETECTED Final   Staphylococcus species NOT DETECTED NOT DETECTED Final   Staphylococcus aureus (BCID) NOT DETECTED NOT DETECTED Final   Staphylococcus epidermidis NOT DETECTED NOT DETECTED Final   Staphylococcus lugdunensis NOT DETECTED NOT DETECTED Final   Streptococcus species NOT DETECTED NOT DETECTED Final   Streptococcus agalactiae NOT DETECTED NOT DETECTED Final   Streptococcus pneumoniae NOT DETECTED NOT DETECTED Final   Streptococcus pyogenes NOT DETECTED NOT DETECTED Final   A.calcoaceticus-baumannii NOT DETECTED NOT DETECTED Final   Bacteroides fragilis NOT DETECTED NOT DETECTED Final   Enterobacterales  NOT DETECTED NOT DETECTED Final   Enterobacter cloacae complex NOT DETECTED NOT DETECTED Final   Escherichia coli NOT DETECTED NOT DETECTED Final   Klebsiella aerogenes NOT DETECTED NOT DETECTED Final   Klebsiella oxytoca NOT DETECTED NOT DETECTED Final   Klebsiella pneumoniae NOT DETECTED NOT DETECTED Final   Proteus species NOT DETECTED NOT DETECTED Final   Salmonella species NOT DETECTED NOT DETECTED Final   Serratia marcescens NOT DETECTED NOT DETECTED Final   Haemophilus influenzae NOT DETECTED NOT DETECTED Final   Neisseria meningitidis NOT DETECTED NOT DETECTED Final   Pseudomonas aeruginosa NOT DETECTED NOT DETECTED Final   Stenotrophomonas maltophilia NOT DETECTED NOT DETECTED Final   Candida albicans NOT DETECTED NOT DETECTED Final   Candida auris NOT DETECTED NOT DETECTED Final   Candida glabrata NOT DETECTED NOT DETECTED Final   Candida krusei NOT DETECTED NOT DETECTED Final   Candida parapsilosis NOT DETECTED NOT DETECTED Final   Candida tropicalis NOT DETECTED NOT DETECTED Final   Cryptococcus neoformans/gattii NOT DETECTED NOT DETECTED Final    Comment: Performed at [facility], [address], [place], [place] [address]  Resp Panel by RT-PCR (Flu A&B, Covid) Nasopharyngeal Swab     Status: None   Collection Time: 02/23/21  8:24 PM   Specimen: Nasopharyngeal Swab; Nasopharyngeal(NP) swabs in vial transport medium  Result Value Ref Range Status   SARS Coronavirus 2 by RT PCR NEGATIVE NEGATIVE Final    Comment: (NOTE) SARS-CoV-2 target nucleic acids are NOT DETECTED.  The SARS-CoV-2 RNA is generally detectable in upper respiratory specimens during the acute phase of infection. The lowest concentration of SARS-CoV-2 viral copies this assay can detect is 138 copies/mL. A negative result does not preclude SARS-Cov-2 infection and should not be used as the sole basis for treatment or other patient management decisions. A negative result may occur with  improper  specimen collection/handling, submission of specimen other than nasopharyngeal swab, presence of viral mutation(s) within the areas targeted by this assay, and inadequate number of viral copies(<138 copies/mL). A negative result must be combined with clinical observations, patient history, and epidemiological information. The expected result is  Negative.  Fact Sheet for Patients:  BloggerCourse.com  Fact Sheet for Healthcare Providers:  SeriousBroker.it  This test is no t yet approved or cleared by the [place] FDA and  has been authorized for detection and/or diagnosis of SARS-CoV-2 by FDA under an Emergency Use Authorization (EUA). This EUA will remain  in effect (meaning this test can be used) for the duration of the COVID-19 declaration under Section 564(b)(1) of the Act, 21 U.S.C.section 360bbb-3(b)(1), unless the authorization is terminated  or revoked sooner.       Influenza A by PCR NEGATIVE NEGATIVE Final   Influenza B by PCR NEGATIVE NEGATIVE Final    Comment: (NOTE) The Xpert Xpress SARS-CoV-2/FLU/RSV plus assay is intended as an aid in the diagnosis of influenza from Nasopharyngeal swab specimens and should not be used as a sole basis for treatment. Nasal washings and aspirates are unacceptable for Xpert Xpress SARS-CoV-2/FLU/RSV testing.  Fact Sheet for Patients: BloggerCourse.com  Fact Sheet for Healthcare Providers: SeriousBroker.it  This test is not yet approved or cleared by the [place] FDA and has been authorized for detection and/or diagnosis of SARS-CoV-2 by FDA under an Emergency Use Authorization (EUA). This EUA will remain in effect (meaning this test can be used) for the duration of the COVID-19 declaration under Section 564(b)(1) of the Act, 21 U.S.C. section 360bbb-3(b)(1), unless the authorization is terminated or revoked.  Performed at  [facility], [address], [place], [place] [address]   MRSA Next Gen by PCR, Nasal     Status: None   Collection Time: 02/23/21 11:37 PM   Specimen: Nasal Mucosa; Nasal Swab  Result Value Ref Range Status   MRSA by PCR Next Gen NOT DETECTED NOT DETECTED Final    Comment: (NOTE) The GeneXpert MRSA Assay (FDA approved for NASAL specimens only), is one component of a comprehensive MRSA colonization surveillance program. It is not intended to diagnose MRSA infection nor to guide or monitor treatment for MRSA infections. Test performance is not FDA approved in patients less than 77 years old. Performed at [facility], [address], [place], [place] [address]   Blood culture (routine x 2)     Status: None (Preliminary result)   Collection Time: 02/24/21  2:04 AM   Specimen: BLOOD LEFT HAND  Result Value Ref Range Status   Specimen Description BLOOD LEFT HAND  Final   Special Requests   Final    BOTTLES DRAWN AEROBIC ONLY Blood Culture adequate volume   Culture   Final    NO GROWTH 3 DAYS Performed at [facility], [address] [place], [place] [address]    Report Status PENDING  Incomplete          Radiology Studies: ECHOCARDIOGRAM LIMITED  Result Date: 02/25/2021    ECHOCARDIOGRAM LIMITED REPORT   Patient Name:   JAKITA WIRE Date of Exam: 02/25/2021 Medical Rec #:  [id number]       Height:       65.0 in Accession #:    [id number]      Weight:       85.8 lb Date of Birth:  12-25-1960       BSA:          1.380 m Patient Age:    60 years        BP:           109/69 mmHg Patient Gender: F  HR:           97 bpm. Exam Location:  Inpatient Procedure: Limited Echo, Cardiac Doppler, Color Doppler and 3D Echo Indications:    Fever  History:        Patient has prior history of Echocardiogram examinations, most                 recent 10/25/2020. CHF and Cardiomyopathy, Abnormal ECG, Stroke,                 Signs/Symptoms:Bacteremia and Altered Mental  Status; Risk                 Factors:Current Smoker, Hypertension and Diabetes. Cardiac                 shock.  Sonographer:    Sheralyn Boatman RDCS Referring Phys: [id number] LAURA R GLEASON IMPRESSIONS  1. Left ventricular ejection fraction, by estimation, is 45 to 50%. The left ventricle has mildly decreased function. The left ventricle demonstrates global hypokinesis. There is severe concentric left ventricular hypertrophy. Left ventricular diastolic  parameters are indeterminate. The average left ventricular global longitudinal strain is -13.8 %. The global longitudinal strain is abnormal.  2. Right ventricular systolic function is normal. The right ventricular size is normal.  3. The mitral valve is normal in structure. Mild mitral valve regurgitation. No evidence of mitral stenosis.  4. The aortic valve is normal in structure. Aortic valve regurgitation is not visualized. Mild aortic valve sclerosis is present, with no evidence of aortic valve stenosis.  5. The inferior vena cava is normal in size with greater than 50% respiratory variability, suggesting right atrial pressure of 3 mmHg. FINDINGS  Left Ventricle: Left ventricular ejection fraction, by estimation, is 45 to 50%. The left ventricle has mildly decreased function. The left ventricle demonstrates global hypokinesis. The average left ventricular global longitudinal strain is -13.8 %. The global longitudinal strain is abnormal. The left ventricular internal cavity size was small. There is severe concentric left ventricular hypertrophy. Left ventricular diastolic parameters are indeterminate. Right Ventricle: The right ventricular size is normal. No increase in right ventricular wall thickness. Right ventricular systolic function is normal. Left Atrium: Left atrial size was normal in size. Right Atrium: Right atrial size was normal in size. Pericardium: There is no evidence of pericardial effusion. Mitral Valve: The mitral valve is normal in structure. Mild  mitral valve regurgitation. No evidence of mitral valve stenosis. Tricuspid Valve: The tricuspid valve is normal in structure. Tricuspid valve regurgitation is trivial. No evidence of tricuspid stenosis. Aortic Valve: The aortic valve is normal in structure. Aortic valve regurgitation is not visualized. Mild aortic valve sclerosis is present, with no evidence of aortic valve stenosis. Pulmonic Valve: The pulmonic valve was normal in structure. Pulmonic valve regurgitation is not visualized. No evidence of pulmonic stenosis. Aorta: The aortic root is normal in size and structure. Venous: The inferior vena cava is normal in size with greater than 50% respiratory variability, suggesting right atrial pressure of 3 mmHg. IAS/Shunts: No atrial level shunt detected by color flow Doppler. LEFT VENTRICLE PLAX 2D LVIDd:         3.10 cm LVIDs:         2.50 cm     2D Longitudinal Strain LV PW:         1.70 cm     2D Strain GLS Avg:     -13.8 % LV IVS:        1.60 cm  LV  Volumes (MOD)           3D Volume EF: LV vol d, MOD A2C: 55.8 ml 3D EF:        47 % LV vol d, MOD A4C: 46.9 ml LV EDV:       88 ml LV vol s, MOD A2C: 27.6 ml LV ESV:       46 ml LV vol s, MOD A4C: 26.8 ml LV SV:        41 ml LV SV MOD A2C:     28.2 ml LV SV MOD A4C:     46.9 ml LV SV MOD BP:      25.6 ml LEFT ATRIUM         Index LA diam:    2.10 cm 1.52 cm/m   AORTA Ao Root diam: 2.60 cm Kardie Tobb DO Electronically signed by Thomasene Ripple DO Signature Date/Time: 02/25/2021/3:13:12 PM    Final         Scheduled Meds:  atorvastatin  40 mg Per Tube QHS   docusate  100 mg Per Tube BID   heparin  5,000 Units Subcutaneous Q8H   insulin aspart  0-9 Units Subcutaneous Q4H   insulin detemir  7 Units Subcutaneous Daily   mouth rinse  15 mL Mouth Rinse BID   metoprolol tartrate  12.5 mg Per Tube BID   multivitamin with minerals  1 tablet Per Tube Daily   senna  1 tablet Per Tube Daily   sodium chloride flush  10-40 mL Intracatheter Q12H   Continuous  Infusions:  [START ON 02/28/2021] cefTRIAXone (ROCEPHIN)  IV     feeding supplement (OSMOLITE 1.2 CAL)       LOS: 4 days    Time spent: 35 minutes.     Alba Cory, MD [facility]   If 7PM-7AM, please contact night-coverage [email or web address]  02/27/2021, 2:40 PM

## 2021-02-27 NOTE — Evaluation (Signed)
Occupational Therapy Evaluation Patient Details Name: Christine Benitez MRN: [id number] DOB: Mar 09, 1961 Today's Date: 02/27/2021   History of Present Illness Pt is 60 yo female who presented to ED with altered mental status and hyperglycemia. PMH significant for diabetes, hypertension, nonischemic cardiomyopathy, pulmonary hypertension, CVA, history of cardiogenic shock, AKI, encephalopathy, cachexia, failure to thrive multiple admissions for DKA.   Clinical Impression   Pt admitted with the above diagnoses and presents with below problem list. Pt will benefit from continued acute OT to address the below listed deficits and maximize independence with basic ADLs prior to d/c to venue below. At baseline, pt needs some assistance with at least bathing, dressing, meal prep, and medication management. Lives with her daughter per chart review. Pt able to sit EOB on eval with up to min A for about 30 seconds d/t onset of dizziness and pt initiating return to supine. Currently needs up to total A with bathing, dressing, pericare. Full session details below.       Recommendations for follow up therapy are one component of a multi-disciplinary discharge planning process, led by the attending physician.  Recommendations may be updated based on patient status, additional functional criteria and insurance authorization.   Follow Up Recommendations  SNF;Supervision/Assistance - 24 hour    Equipment Recommendations  Other (comment) (TBD)    Recommendations for Other Services PT consult     Precautions / Restrictions Precautions Precautions: Fall Restrictions Weight Bearing Restrictions: No      Mobility Bed Mobility Overal bed mobility: Needs Assistance Bed Mobility: Supine to Sit;Sit to Supine;Rolling Rolling: Max assist   Supine to sit: Max assist Sit to supine: Max assist   General bed mobility comments: assist for all aspects. Pt able to hold bed rail in sidelying to faciliate this  position.    Transfers                 General transfer comment: unable to assess    Balance Overall balance assessment: Needs assistance Sitting-balance support: Bilateral upper extremity supported;Feet supported;Feet unsupported Sitting balance-Leahy Scale: Poor Sitting balance - Comments: BUE support for static sitting                                   ADL either performed or assessed with clinical judgement   ADL Overall ADL's : Needs assistance/impaired Eating/Feeding: Bed level;Minimal assistance   Grooming: Moderate assistance;Bed level   Upper Body Bathing: Total assistance;Bed level   Lower Body Bathing: Total assistance   Upper Body Dressing : Total assistance;Maximal assistance   Lower Body Dressing: Total assistance;Bed level                 General ADL Comments: Able to sit EOB for <1 minute. Pt reported dizziness and inititatied lying back down despite encouragement to try to maintain sitting a bit longer to see if dizziness would resolve with time.     Vision         Perception     Praxis      Pertinent Vitals/Pain Pain Assessment: Faces Faces Pain Scale: Hurts even more Pain Location: during pericare/bowel movment. visually appeared impacted. NT assisting pt during session. Pain Descriptors / Indicators: Discomfort;Grimacing Pain Intervention(s): Limited activity within patient's tolerance;Monitored during session;Repositioned     Hand Dominance Right   Extremity/Trunk Assessment Upper Extremity Assessment Upper Extremity Assessment: Difficult to assess due to impaired cognition;Generalized weakness   Lower Extremity Assessment  Lower Extremity Assessment: Defer to PT evaluation   Cervical / Trunk Assessment Cervical / Trunk Assessment: Kyphotic   Communication Communication Communication: HOH;Other (comment) (receptive difficulties?)   Cognition Arousal/Alertness: Awake/alert Behavior During Therapy: Flat  affect Overall Cognitive Status: No family/caregiver present to determine baseline cognitive functioning                                     General Comments       Exercises     Shoulder Instructions      Home Living Family/patient expects to be discharged to:: Unsure                                 Additional Comments: no family present to confirm      Prior Functioning/Environment Level of Independence: Needs assistance        Comments: Per prior OT note in August pt was using rollator. Daughter assisted with bathing, dressing, med management, and meal prep. Unclear how much assist pt has been needing since then. Pt unable to provide history. No family present on OT eval.        OT Problem List: Decreased strength;Decreased activity tolerance;Impaired balance (sitting and/or standing);Decreased cognition;Decreased safety awareness;Decreased knowledge of use of DME or AE;Decreased knowledge of precautions;Cardiopulmonary status limiting activity;Pain      OT Treatment/Interventions: Self-care/ADL training;Therapeutic exercise;Energy conservation;DME and/or AE instruction;Therapeutic activities;Patient/family education;Balance training;Cognitive remediation/compensation    OT Goals(Current goals can be found in the care plan section) Acute Rehab OT Goals Patient Stated Goal: not stated OT Goal Formulation: With patient Time For Goal Achievement: 03/13/21 Potential to Achieve Goals: Good ADL Goals Pt Will Perform Eating: with supervision;with set-up;bed level Pt Will Perform Grooming: with min assist;bed level Pt Will Perform Upper Body Bathing: with mod assist Pt Will Perform Lower Body Bathing: with mod assist;sitting/lateral leans;bed level Pt Will Transfer to Toilet: with min assist;stand pivot transfer;bedside commode;squat pivot transfer Pt Will Perform Toileting - Clothing Manipulation and hygiene: sit to/from stand;sitting/lateral  leans;with mod assist Additional ADL Goal #1: Pt will complete bed mobility at min A level to prepare for EOB/OOB.  OT Frequency: Min 2X/week   Barriers to D/C:            Co-evaluation              AM-PAC OT "6 Clicks" Daily Activity     Outcome Measure Help from another person eating meals?: Total Help from another person taking care of personal grooming?: A Lot Help from another person toileting, which includes using toliet, bedpan, or urinal?: Total Help from another person bathing (including washing, rinsing, drying)?: Total Help from another person to put on and taking off regular upper body clothing?: Total Help from another person to put on and taking off regular lower body clothing?: Total 6 Click Score: 7   End of Session Nurse Communication: Other (comment) (NT assisting pt during session)  Activity Tolerance: Other (comment) (+ dizzziness. significant bowel movement after sitting EOB briefly) Patient left: in bed;with call bell/phone within reach;Other (comment) (hand mitts reapplied)  OT Visit Diagnosis: Muscle weakness (generalized) (M62.81);Unsteadiness on feet (R26.81);Other symptoms and signs involving the nervous system (R29.898);Other symptoms and signs involving cognitive function;Dizziness and giddiness (R42);Adult, failure to thrive (R62.7);Pain;Feeding difficulties (R63.3)                Time:  6837-2902 OT Time Calculation (min): 57 min Charges:  OT General Charges $OT Visit: 1 Visit OT Evaluation $OT Eval Moderate Complexity: 1 Mod OT Treatments $Self Care/Home Management : 38-52 mins  Raynald Kemp, OT Acute Rehabilitation Services Pager: [phone] Office: [phone]   Pilar Grammes 02/27/2021, 11:34 AM

## 2021-02-27 NOTE — Progress Notes (Signed)
This chaplain responded to PMT consult for creating/updating the Pt. Advance Directive through communication with PMT NP-Michele.  The chaplain understands the Pt. is unable to complete an Advance Directive at this time due to the presentation of altered mental status.  This chaplain will communicate with the PMT to determine the best time for a re-visit.  Chaplain Stephanie Acre [phone]

## 2021-02-27 NOTE — Consult Note (Addendum)
Palliative Medicine Inpatient Consult Note  Consulting Provider: Margaretha Seeds, MD  Reason for consult:   [facility] Consult  Reason for Consult? Seen by palliative as outpatient. Readmission for DKA. Brittle DKA   HPI:  Per intake H&P --> 60 year old with past medical history significant for diabetes, hypertension, nonischemic cardiomyopathy, pulmonary hypertension, CVA, history of cardiogenic shock, AKI, encephalopathy, cachexia, failure to thrive multiple admissions for DKA.  [facility]17 with altered mental status and hyperglycemia. Patient was started on tube feeding on 10/19 due to poor oral intake.  Palliative care consulted for goals of care.  The [facility] Palliative care team has seen Makaylen multiple times from November 2021, June 2022, & July 2022. She is followed by OP Palliative support through [facility] though per  note review she has not been seen since August 2022.   Clinical Assessment/Goals of Care:  *Please note that this is a verbal dictation therefore any spelling or grammatical errors are due to the "[employer] One" system interpretation.  I have reviewed medical records including EPIC notes, labs and imaging, received report from bedside RN, assessed the patient who is lying in bed, she is slow to respond and .    I met with Blair Promise. Raborn and called her daughter, Lorissa Kishbaugh to further discuss diagnosis prognosis, GOC, EOL wishes, disposition and options.  We reviewed patients co-morbidities inclusive of prior stroke(s), CHF (EF 20 to 25%), pulmonary HTN, DM type II.    I introduced Palliative Medicine as specialized medical care for people living with serious illness. It focuses on providing relief from the symptoms and stress of a serious illness. The goal is to improve quality of life for both the patient and the family.  A brief life review was had: Miku shares with me that she is from [place],  [place]. She is divorced. She has a son and a daughter ages 31 and 49 who live locally. 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 She is a woman of faith and practices within the [employer] denomination.  She has been dependent on her daughter, [name]. Cyani has been doing some bADL's on her own, she uses a walker.   A detailed discussion was had today regarding advanced directives - there are none on file though per chart review in the past her mother, [name] has helped with decision making.    Concepts specific to code status, artifical feeding and hydration, continued IV antibiotics and rehospitalization was had.  Kiarah's prior MOST form was reviewed to verify the following wishes:   Cardiopulmonary Resuscitation: Do Not Attempt Resuscitation (DNR/No CPR)  Medical Interventions: Limited Additional Interventions: Use medical treatment, IV fluids and cardiac monitoring as indicated, DO NOT USE intubation or mechanical ventilation. May consider use of less invasive airway support such as BiPAP or CPAP. Also provide comfort measures. Transfer to the hospital if indicated. Avoid intensive care.   Antibiotics: Antibiotics if indicated  IV Fluids: IV fluids for a defined trial period  Feeding Tube: No feeding tube    The difference between aggressive medical intervention path  and a palliative comfort care path for this patient at this time was had. I broached the topic of hospice given Chauntel's declining health state. She shared, " I don't like hospice" but couldn't explain her reasoning.   We reviewed that Brandi is presently dependent on artificial nutrition. We discussed if she would even wants a permanent gastrostomy  tube which Caelie was unable to answer as she kept perseverating on her dislike of the word hospice.   Discussed the importance of continued conversation with family and their  medical providers regarding overall plan of care and treatment  options, ensuring decisions are within the context of the patients values and GOCs. ________________________________________________________________________________ Addendum:  Again, I spoke to patient's daughter, [name] over the telephone.  A brief review of Avalon's acute on chronic medical conditions was had.  We discussed her overall clinical condition in the setting of her brittle diabetes, [name] shares that she too suffers from diabetes which has been hard to manage.    We reviewed that [name] is her mom's primary caregiver when her mother is at home and for the most part her mother had been doing all right.  I shared my concern and wary given Anaisabel's recurrent hospitalizations which have been 5 in the past 6 months.  That this indicates that she is not doing well in the greater scheme of things.  We reviewed that Haile now needs artificial nutrition.   I asked [name] if she had considered hospice for her mom as we are getting closer to a point when that would be of the greatest benefit for her, especially if long term tube feeding is not aligned with patients goals which prior it had not been.  We plan to meet in person over the weekend for more detailed in person conversations.   Decision Maker: Saralyn Willison (daughter) [phone]  SUMMARY OF RECOMMENDATIONS   DNAR/DNI  MOST and DNR form are on Vynca  Appreciate advance directive completion when patient more alert and oriented  I will discuss with patient and her daughter the idea of a long term gastrostomy tube given patients poor PO's. I have requested for patients daughter, [name] to come in for an in person conversation this weekend.  Being followed as an outpatient by [facility] would benefit from additional conversations on their hospice services given patient's poor overall clinical condition  Ongoing palliative support while inpatient  Code Status/Advance Care Planning: DNAR/DNI  Palliative Prophylaxis:  Oral care,  mobility  Additional Recommendations (Limitations, Scope, Preferences): Continue current scope of care   Psycho-social/Spiritual:  Desire for further Chaplaincy support: Yes, patient is [facility] Additional Recommendations: Education on chronic comorbidities and artificial tube feeding   Prognosis: Exceptionally poor in the setting of recurrent readmissions, failure to thrive, severe frailty  Discharge Planning: Discharge plan uncertain at this time.  Vitals:   02/27/21 0009 02/27/21 0442  BP: (!) 93/58 106/66  Pulse: 80 92  Resp: 15 14  Temp: 98.9 F (37.2 C) 99.1 F (37.3 C)  SpO2: 100% 100%    Intake/Output Summary (Last 24 hours) at 02/27/2021 7253 Last data filed at 02/26/2021 2343 Gross per 24 hour  Intake 304.22 ml  Output 450 ml  Net -145.78 ml   Last Weight  Most recent update: 02/27/2021  4:53 AM    Weight  40.3 kg (88 lb 13.5 oz)            Gen: Elderly female appears older than stated age HEENT: Dry mucous membranes, core track is in place CV: Regular rate and rhythm PULM: On room air ABD: soft/nontender EXT: No edema Neuro: Alert and oriented x2  PPS: 20%   This conversation/these recommendations were discussed with patient primary care team, Dr. Tyrell Antonio  Time In: 1420 Time Out:1532 Total Time: 72 Greater than 50%  of this time was spent counseling and coordinating care related  to the above assessment and plan.  [facility] Team Team Cell Phone: [phone] Please utilize secure chat with additional questions, if there is no response within 30 minutes please call the above phone number  Palliative Medicine Team providers are available by phone from 7am to 7pm daily and can be reached through the team cell phone.  Should this patient require assistance outside of these hours, please call the patient's attending physician.

## 2021-02-27 NOTE — Progress Notes (Signed)
Inpatient Diabetes Program Recommendations  AACE/ADA: New Consensus Statement on Inpatient Glycemic Control   Target Ranges:  Prepandial:   less than 140 mg/dL      Peak postprandial:   less than 180 mg/dL (1-2 hours)      Critically ill patients:  140 - 180 mg/dL   Results for KATELEE, SCHUPP (MRN [id number]) as of 02/27/2021 09:39  Ref. Range 02/26/2021 08:28 02/26/2021 11:10 02/26/2021 17:36 02/26/2021 20:30 02/26/2021 23:26 02/27/2021 04:40  Glucose-Capillary Latest Ref Range: 70 - 99 mg/dL 294 (H) 765 (H) 465 (H) 235 (H) 116 (H) 138 (H)   Review of Glycemic Control  Diabetes history: DM Outpatient Diabetes medications: Lantus 10 units BID, Novolog 5 units TID with meals Current orders for Inpatient glycemic control: Leveimr 7 units daily, Novolog 0-9 units Q4H; Osmolite @ 20 ml/hr  Inpatient Diabetes Program Recommendations:    Insulin: Please consider ordering Novolog 3 units TID with meals for meal coverage if patient eats at least 50% of meals.    Thanks, Orlando Penner, RN, MSN, CDE Diabetes Coordinator Inpatient Diabetes Program [phone] (Team Pager from 8am to 5pm)

## 2021-02-27 NOTE — Progress Notes (Signed)
Nutrition Follow-up  DOCUMENTATION CODES:   Severe malnutrition in context of chronic illness, Underweight  INTERVENTION:   Continue Ensure Enlive po TID, each supplement provides 350 kcal and 20 grams of protein. Continue Magic cup TID with meals, each supplement provides 290 kcal and 9 grams of protein. Continue MVI with minerals daily. TF via Cortrak tube: Osmolite 1.2 increase to goal rate of 50 ml/h to provide 1440 kcal, 67 gm protein, 984 ml free water daily.  NUTRITION DIAGNOSIS:   Severe Malnutrition related to chronic illness (DM with frequent DKA, NICM, CHF) as evidenced by severe muscle depletion, severe fat depletion, percent weight loss (16% weight loss within 3 months).  Ongoing  GOAL:   Patient will meet greater than or equal to 90% of their needs  Progressing   MONITOR:   PO intake, TF tolerance, Labs  REASON FOR ASSESSMENT:   Consult Enteral/tube feeding initiation and management  ASSESSMENT:   60 yo female admitted with AMS, DKA. PMH includes DM, frequent admissions for DKA, HTN, NICM, pulmonary HTN, chronic cholecystitis with calculus, anemia, CHF, tobacco abuse.  Cortrak feeding tube placed 10/19. Tip is at the duodenal bulb.  Currently receiving Osmolite 1.2 at 20 ml/h. Also receiving a dysphagia 3 diet with thin liquids. Meal intake is minimal, 0-10% meal intakes recorded 10/19. RN reports patient is taking bites of meals today.   Labs reviewed. Phos down to 1.7 10/20, up to 5 today after supplementation with sodium phosphate 10/20. K 3.4 on 10/19, up to 4.6 10/20. Suspect alterations in phos and K are related to refeeding syndrome. Phos and mag have been supplemented and are no longer below normal.  CBG: 138-271  Medications reviewed and include Colace, Novolog SSI q 4 hours, Levemir, MVI with minerals.  Admission weight 37.1 kg Current weight 40.3 kg  Diet Order:   Diet Order             DIET DYS 3 Room service appropriate? No; Fluid  consistency: Thin  Diet effective now                   EDUCATION NEEDS:   Not appropriate for education at this time  Skin:  Skin Assessment: Reviewed RN Assessment  Last BM:  10/21  Height:   Ht Readings from Last 1 Encounters:  02/27/21 5\' 5"  (1.651 m)    Weight:   Wt Readings from Last 1 Encounters:  02/27/21 40.3 kg    BMI:  Body mass index is 14.78 kg/m.  Estimated Nutritional Needs:   Kcal:  1350-1550  Protein:  60-70 gm  Fluid:  1.4-1.6 L    03/01/21, RD, LDN, CNSC Please refer to Amion for contact information.

## 2021-02-27 NOTE — Evaluation (Signed)
Physical Therapy Evaluation Patient Details Name: Christine Benitez MRN: [id number] DOB: 22-Apr-1961 Today's Date: 02/27/2021  History of Present Illness  Pt is a 60 y.o. female who presented 02/23/21 with AMS and hyperglycemia. Admitted with DKA, acute encephalopathy, possible sepsis, and metabolic acidosis. PMH: diabetes, hypertension, nonischemic cardiomyopathy, pulmonary hypertension, CVA, history of cardiogenic shock, AKI, encephalopathy, cachexia, failure to thrive multiple admissions   Clinical Impression  Pt presents with condition above and deficits mentioned below, see PT Problem List. No family present and pt unable to provide info on PLOF or home set-up. Per prior PT session in hospital in August pt ambulated ~40 ft with a RW and min guard-minA. Per prior therapy notes in August pt was using rollator. Daughter assisted with bathing, dressing, med management, and meal prep. Unclear how much assist pt has been needing since then. Currently, pt requires min guard assist to roll and sit statically EOB ~2 minutes, maxA to transition supine <> sit EOB, and TA to scoot laterally on EOB. Pt with deficits in cognition, balance, activity tolerance, and strength. She is at high risk for falls. In addition, pt reported dizziness with changes in position, declining to stand and requesting to return to supine not long after sitting EOB, see BP measures below. Recommending pt receive skilled PT at SNF setting to maximize her independence and safety with all functional mobility. Will continue to follow acutely.  BP:  120/70 supine 111/72 sitting with reports of dizziness     Recommendations for follow up therapy are one component of a multi-disciplinary discharge planning process, led by the attending physician.  Recommendations may be updated based on patient status, additional functional criteria and insurance authorization.  Follow Up Recommendations SNF;Supervision/Assistance - 24 hour    Equipment  Recommendations  Other (comment) (defer to next venue of care)    Recommendations for Other Services       Precautions / Restrictions Precautions Precautions: Fall Precaution Comments: orthostatic hypotension?; HOH Restrictions Weight Bearing Restrictions: No      Mobility  Bed Mobility Overal bed mobility: Needs Assistance Bed Mobility: Supine to Sit;Sit to Supine;Rolling Rolling: Min guard   Supine to sit: Max assist Sit to supine: Max assist   General bed mobility comments: Pt able to roll either direction using bed rails with min guard assist and extra time. MaxA to manage trunk and legs for transition supine <> sit EOB.    Transfers Overall transfer level: Needs assistance   Transfers: Lateral/Scoot Transfers          Lateral/Scoot Transfers: Total assist General transfer comment: Pt declined to stand and displaying symptomatic orthostatic hypotension, thus deferred standing. TA to scoot to R on EOB.  Ambulation/Gait             General Gait Details: Pt declined and displaying symptomatic orthostatic hypotension, thus deferred  Stairs            Wheelchair Mobility    Modified Rankin (Stroke Patients Only) Modified Rankin (Stroke Patients Only) Pre-Morbid Rankin Score: Moderate disability Modified Rankin: Severe disability     Balance Overall balance assessment: Needs assistance Sitting-balance support: Bilateral upper extremity supported;Feet supported Sitting balance-Leahy Scale: Poor Sitting balance - Comments: Min guard and bil UE support for static sitting EOB for up to ~2 min.                                     Pertinent  Vitals/Pain Pain Assessment: Faces Faces Pain Scale: Hurts little more Pain Location: during pericare Pain Descriptors / Indicators: Discomfort;Grimacing Pain Intervention(s): Limited activity within patient's tolerance;Monitored during session;Repositioned    Home Living Family/patient expects  to be discharged to:: Unsure                 Additional Comments: no family present to confirm    Prior Function Level of Independence: Needs assistance         Comments: Per prior PT session in hospital in August pt ambulated ~40 ft with a RW and min guard-minA. Per prior therapy notes in August pt was using rollator. Daughter assisted with bathing, dressing, med management, and meal prep. Unclear how much assist pt has been needing since then. Pt unable to provide history. No family present on PT eval.     Hand Dominance   Dominant Hand: Right    Extremity/Trunk Assessment   Upper Extremity Assessment Upper Extremity Assessment: Defer to OT evaluation    Lower Extremity Assessment Lower Extremity Assessment: Difficult to assess due to impaired cognition;Generalized weakness    Cervical / Trunk Assessment Cervical / Trunk Assessment: Kyphotic  Communication   Communication: HOH;Other (comment) (receptive difficulties?)  Cognition Arousal/Alertness: Awake/alert Behavior During Therapy: Flat affect Overall Cognitive Status: No family/caregiver present to determine baseline cognitive functioning                                        General Comments General comments (skin integrity, edema, etc.): BP: 120/70 supine, 111/72 sitting with reports of dizziness; extra time for pericare    Exercises     Assessment/Plan    PT Assessment Patient needs continued PT services  PT Problem List Decreased strength;Decreased activity tolerance;Decreased range of motion;Decreased balance;Decreased mobility;Decreased coordination;Decreased cognition;Cardiopulmonary status limiting activity       PT Treatment Interventions DME instruction;Gait training;Functional mobility training;Therapeutic activities;Therapeutic exercise;Balance training;Neuromuscular re-education;Cognitive remediation;Patient/family education;Wheelchair mobility training    PT Goals (Current  goals can be found in the Care Plan section)  Acute Rehab PT Goals Patient Stated Goal: not stated PT Goal Formulation: With patient Time For Goal Achievement: 03/13/21 Potential to Achieve Goals: Fair    Frequency Min 2X/week   Barriers to discharge        Co-evaluation               AM-PAC PT "6 Clicks" Mobility  Outcome Measure Help needed turning from your back to your side while in a flat bed without using bedrails?: A Little Help needed moving from lying on your back to sitting on the side of a flat bed without using bedrails?: A Lot Help needed moving to and from a bed to a chair (including a wheelchair)?: Total Help needed standing up from a chair using your arms (e.g., wheelchair or bedside chair)?: Total Help needed to walk in hospital room?: Total Help needed climbing 3-5 steps with a railing? : Total 6 Click Score: 9    End of Session   Activity Tolerance: Patient tolerated treatment well;Other (comment) (limited by dizziness) Patient left: in bed;with call bell/phone within reach;with bed alarm set Nurse Communication: Mobility status;Other (comment) (BP) PT Visit Diagnosis: Muscle weakness (generalized) (M62.81);Difficulty in walking, not elsewhere classified (R26.2);Dizziness and giddiness (R42)    Time: 1332-1401 PT Time Calculation (min) (ACUTE ONLY): 29 min   Charges:   PT Evaluation $PT Eval Moderate Complexity: 1 Mod  PT Treatments $Therapeutic Activity: 8-22 mins        Raymond Gurney, PT, DPT Acute Rehabilitation Services  Pager: [phone] Office: [phone]   Jewel Baize 02/27/2021, 3:40 PM

## 2021-02-28 ENCOUNTER — Encounter (HOSPITAL_COMMUNITY): Payer: Self-pay | Admitting: Internal Medicine

## 2021-02-28 ENCOUNTER — Other Ambulatory Visit: Payer: Self-pay

## 2021-02-28 DIAGNOSIS — Z66 Do not resuscitate: Secondary | ICD-10-CM

## 2021-02-28 DIAGNOSIS — Z7189 Other specified counseling: Secondary | ICD-10-CM

## 2021-02-28 LAB — CBC
HCT: 22.1 % — ABNORMAL LOW (ref 36.0–46.0)
Hemoglobin: 7.1 g/dL — ABNORMAL LOW (ref 12.0–15.0)
MCH: 31 pg (ref 26.0–34.0)
MCHC: 32.1 g/dL (ref 30.0–36.0)
MCV: 96.5 fL (ref 80.0–100.0)
Platelets: 189 10*3/uL (ref 150–400)
RBC: 2.29 MIL/uL — ABNORMAL LOW (ref 3.87–5.11)
RDW: 14.8 % (ref 11.5–15.5)
WBC: 6 10*3/uL (ref 4.0–10.5)
nRBC: 0 % (ref 0.0–0.2)

## 2021-02-28 LAB — PHOSPHORUS: Phosphorus: 2.6 mg/dL (ref 2.5–4.6)

## 2021-02-28 LAB — BASIC METABOLIC PANEL
Anion gap: 4 — ABNORMAL LOW (ref 5–15)
BUN: 14 mg/dL (ref 6–20)
CO2: 26 mmol/L (ref 22–32)
Calcium: 8.1 mg/dL — ABNORMAL LOW (ref 8.9–10.3)
Chloride: 105 mmol/L (ref 98–111)
Creatinine, Ser: 0.84 mg/dL (ref 0.44–1.00)
GFR, Estimated: >60 mL/min (ref >60)
Glucose, Bld: 264 mg/dL — ABNORMAL HIGH (ref 70–99)
Potassium: 4.7 mmol/L (ref 3.5–5.1)
Sodium: 135 mmol/L (ref 135–145)

## 2021-02-28 LAB — GLUCOSE, CAPILLARY
Glucose-Capillary: 160 mg/dL — ABNORMAL HIGH (ref 70–99)
Glucose-Capillary: 224 mg/dL — ABNORMAL HIGH (ref 70–99)
Glucose-Capillary: 240 mg/dL — ABNORMAL HIGH (ref 70–99)
Glucose-Capillary: 263 mg/dL — ABNORMAL HIGH (ref 70–99)
Glucose-Capillary: 292 mg/dL — ABNORMAL HIGH (ref 70–99)
Glucose-Capillary: 80 mg/dL (ref 70–99)
Glucose-Capillary: 90 mg/dL (ref 70–99)

## 2021-02-28 LAB — MAGNESIUM: Magnesium: 1.9 mg/dL (ref 1.7–2.4)

## 2021-02-28 MED ORDER — K PHOS MONO-SOD PHOS DI & MONO 155-852-130 MG PO TABS
250.0000 mg | ORAL_TABLET | Freq: Two times a day (BID) | ORAL | Status: DC
  Administered 2021-02-28 – 2021-03-01 (×2): 250 mg
  Filled 2021-02-28 (×3): qty 1

## 2021-02-28 MED ORDER — INSULIN DETEMIR 100 UNIT/ML ~~LOC~~ SOLN
3.0000 [IU] | Freq: Once | SUBCUTANEOUS | Status: AC
  Administered 2021-02-28: 3 [IU] via SUBCUTANEOUS
  Filled 2021-02-28: qty 0.03

## 2021-02-28 MED ORDER — K PHOS MONO-SOD PHOS DI & MONO 155-852-130 MG PO TABS
250.0000 mg | ORAL_TABLET | Freq: Two times a day (BID) | ORAL | Status: DC
  Administered 2021-02-28: 250 mg via ORAL
  Filled 2021-02-28 (×2): qty 1

## 2021-02-28 MED ORDER — SODIUM CHLORIDE 0.9 % IV BOLUS
500.0000 mL | Freq: Once | INTRAVENOUS | Status: AC
  Administered 2021-02-28: 500 mL via INTRAVENOUS

## 2021-02-28 MED ORDER — SACCHAROMYCES BOULARDII 250 MG PO CAPS
250.0000 mg | ORAL_CAPSULE | Freq: Two times a day (BID) | ORAL | Status: DC
  Administered 2021-02-28: 250 mg via ORAL
  Filled 2021-02-28 (×2): qty 1

## 2021-02-28 MED ORDER — METOPROLOL TARTRATE 12.5 MG HALF TABLET
12.5000 mg | ORAL_TABLET | Freq: Two times a day (BID) | ORAL | Status: DC
  Administered 2021-02-28 – 2021-03-01 (×3): 12.5 mg
  Filled 2021-02-28 (×3): qty 1

## 2021-02-28 MED ORDER — INSULIN DETEMIR 100 UNIT/ML ~~LOC~~ SOLN
10.0000 [IU] | Freq: Every day | SUBCUTANEOUS | Status: DC
  Administered 2021-03-01 – 2021-03-04 (×4): 10 [IU] via SUBCUTANEOUS
  Filled 2021-02-28 (×4): qty 0.1

## 2021-02-28 MED ORDER — GLUCERNA 1.2 CAL PO LIQD
1000.0000 mL | ORAL | Status: DC
  Administered 2021-02-28: 1000 mL
  Filled 2021-02-28 (×2): qty 1000

## 2021-02-28 MED ORDER — SACCHAROMYCES BOULARDII 250 MG PO CAPS
250.0000 mg | ORAL_CAPSULE | Freq: Two times a day (BID) | ORAL | Status: DC
  Administered 2021-02-28 – 2021-03-01 (×2): 250 mg
  Filled 2021-02-28 (×3): qty 1

## 2021-02-28 NOTE — Social Work (Signed)
  CSW received a call back from pt's daughter stating that she did not think that pt had any insurance and would like for pt to be discharged back home with her.  Please refer to palliative note which states that pt's mother is concerned with pt's daughter cognitive functioning and ability to care for pt at home.  I asked daughter to please check to ascertain If she had BCBS as previously mentioned by pt's mother who was not sure.  TOC team will follow up again tomorrow to continue to discuss viable DC plans.

## 2021-02-28 NOTE — Progress Notes (Signed)
Civil engineer, contracting [facility])  Hospice hospital liaison note  Reached out to patient's mother [name] per request of PMT to discuss differences between Palliative and Hospice and how patient might be served differently. Provided overview of difference between both programs, patient is currently followed by our palliative program.   All questions answered as able.   [facility] will follow along and support as able.   Thanks,  Thea Gist, BSN, Charity fundraiser, [facility] hospital liaison [phone]

## 2021-02-28 NOTE — Progress Notes (Signed)
PROGRESS NOTE    Christine Benitez  IRS:[id number] DOB: [birth year] DOA: 02/23/2021 PCP: Lupita Dawn, MD   Brief Narrative: 60 year old with past medical history significant for diabetes, hypertension, nonischemic cardiomyopathy, pulmonary hypertension, CVA, history of cardiogenic shock, AKI, encephalopathy, cachexia, failure to thrive multiple admissions for DKA.  Presents to [facility], ED 10/17 with altered mental status and hyperglycemia.  In the ED patient was altered, CO2 less than 7, glucose 486, lactic acid 3.7, beta hydroxybutyric acid more than 8.  She was given 2 L of LR and insulin infusion.  Patient was admitted by CCM.  Care transferred to [facility] 30/20/2022.  Family confirmed DNR status.  Patient has been followed by palliative care as an outpatient.  Patient was started on tube feeding on 10/19 due to poor oral intake.  Palliative care consulted for goals of care.   Assessment & Plan:   Active Problems:   DKA (diabetic ketoacidosis) (HCC)   Encephalopathy acute   Metabolic acidosis  1-DKA: Unclear if patient has been using insulin. She was transitioned to Levemir 8 units twice daily with subsequently discontinued due to CBG in the 70s.   Change Levemir 10 units daily Daily with a sliding scale insulin Ac 13.   2-Sepsis: patient presented with tachycardia, fevers, leukocytosis wbc 18./  1-3 Blood culture grew Micococcus species, CT pelvis: Small 9 mm subcutaneous phlegmon or abscess within the left inguinal fold.  This Discontinued to drain Chest x ray: Atelectasis at the bases. UA: Negative white blood cell Urine culture was not sent. Continue with Ceftriaxone and flagyl   Anemia; probably anemia of acute illness and chronic disease B12: 737, iron 79, ferritin 423.  Folic acid 6.5 Follow trend. Blood transfusion for hb less than 7  Acute metabolic encephalopathy: Status likely related to DKA and metabolic derangement.  Acute on chronic.   AKI: Resolved with IV  fluid  Diastolic heart failure, nonischemic cardiomyopathy: Holding aspirin and Plavix today due to drop in hemoglobin Severe protein caloric malnutrition: See below She was a started on tube feedings in the ICU.   Palliative care consulted for goals of care. Family meeting this weekend.   History of CVA: holding aspirin/plavix due to anemia   Nutrition Problem: Severe Malnutrition Etiology: chronic illness (DM with frequent DKA, NICM, CHF)    Signs/Symptoms: severe muscle depletion, severe fat depletion, percent weight loss (16% weight loss within 3 months) Percent weight loss: 16 %    Interventions: Tube feeding, Ensure Enlive (each supplement provides 350kcal and 20 grams of protein), MVI, Magic cup  Estimated body mass index is 15.13 kg/m as calculated from the following:   Height as of this encounter: 5\' 5"  (1.651 m).   Weight as of this encounter: 41.2 kg.   DVT prophylaxis: Heparin Code Status: DNR Family Communication: Daughter over phone 10/20 Disposition Plan:  Status is: Inpatient  Remains inpatient appropriate because:  DKA hyperglycemia        Consultants:  CCM admitted patient.  Palliative care.   Procedures:    Antimicrobials:    Subjective: She is alert, more responsive today   Objective: Vitals:   02/28/21 0101 02/28/21 0314 02/28/21 0746 02/28/21 0853  BP: 98/61 (!) 102/53 126/67 (!) 101/59  Pulse: 85 91 100 82  Resp: 15 17 13    Temp:  99.7 F (37.6 C) 99.9 F (37.7 C)   TempSrc:  Oral Oral   SpO2: 100% 100% 100%   Weight:  41.2 kg    Height:  Intake/Output Summary (Last 24 hours) at 02/28/2021 1131 Last data filed at 02/28/2021 0433 Gross per 24 hour  Intake 101.09 ml  Output --  Net 101.09 ml    Filed Weights   02/26/21 0500 02/27/21 0452 02/28/21 0314  Weight: 38.6 kg 40.3 kg 41.2 kg    Examination:  General exam: Frail, cachectic Respiratory system: Clear to auscultation Cardiovascular system: S1-S2  regular rhythm and rate Gastrointestinal system: Bowel sounds present, soft nontender non-distended Central nervous system: He is alert, she is talking more, repeating herself over and over Extremities: No edema Pubic area; small open are left inguinal fold, drain some purulence.   Data Reviewed: I have personally reviewed following labs and imaging studies  CBC: Recent Labs  Lab 02/23/21 1547 02/23/21 2201 02/26/21 0419 02/27/21 0143 02/28/21 0226  WBC 19.1* 18.0* 5.3 5.0 6.0  NEUTROABS 16.3*  --   --   --   --   HGB 11.4* 10.5* 7.7* 7.5* 7.1*  HCT 39.4 35.3* 22.9* 23.2* 22.1*  MCV 105.6* 103.5* 93.9 95.1 96.5  PLT 329 236 144* 150 189    Basic Metabolic Panel: Recent Labs  Lab 02/25/21 1535 02/25/21 1746 02/25/21 2156 02/26/21 0419 02/26/21 1600 02/27/21 0143 02/28/21 0226  NA 137 138  --  137 136  --  135  K 3.8 3.4*  --  3.6 4.6  --  4.7  CL 106 102  --  101 103  --  105  CO2 25 24  --  28 25  --  26  GLUCOSE 83 128*  --  144* 223*  --  264*  BUN 8 8  --  13 14  --  14  CREATININE 0.79 0.75  --  0.73 0.78  --  0.84  CALCIUM 8.3* 8.0*  --  8.1* 8.3*  --  8.1*  MG  --  2.0  --  2.0 2.0 1.9 1.9  PHOS  --  2.1* 4.6 2.8 1.7* 5.0* 2.6    GFR: Estimated Creatinine Clearance: 46.9 mL/min (by C-G formula based on SCr of 0.84 mg/dL). Liver Function Tests: Recent Labs  Lab 02/23/21 1547  AST 15  ALT 9  ALKPHOS 61  BILITOT 1.8*  PROT 7.3  ALBUMIN 3.7    No results for input(s): LIPASE, AMYLASE in the last 168 hours. No results for input(s): AMMONIA in the last 168 hours. Coagulation Profile: No results for input(s): INR, PROTIME in the last 168 hours. Cardiac Enzymes: No results for input(s): CKTOTAL, CKMB, CKMBINDEX, TROPONINI in the last 168 hours. BNP (last 3 results) No results for input(s): PROBNP in the last 8760 hours. HbA1C: Recent Labs    02/26/21 0836  HGBA1C 13.8*    CBG: Recent Labs  Lab 02/27/21 1637 02/27/21 1958 02/28/21 0045  02/28/21 0319 02/28/21 0744  GLUCAP 145* 147* 224* 292* 263*    Lipid Profile: No results for input(s): CHOL, HDL, LDLCALC, TRIG, CHOLHDL, LDLDIRECT in the last 72 hours. Thyroid Function Tests: Recent Labs    02/26/21 0836  TSH 1.475    Anemia Panel: Recent Labs    02/26/21 0836  VITAMINB12 737  FOLATE 6.5  FERRITIN 423*  TIBC 164*  IRON 79  RETICCTPCT 1.6    Sepsis Labs: Recent Labs  Lab 02/23/21 1555 02/24/21 0700  LATICACIDVEN 3.7* 1.9     Recent Results (from the past 240 hour(s))  Blood culture (routine x 2)     Status: Abnormal   Collection Time: 02/23/21  5:28 PM  Specimen: BLOOD  Result Value Ref Range Status   Specimen Description BLOOD CENTRAL LINE  Final   Special Requests   Final    BOTTLES DRAWN AEROBIC AND ANAEROBIC Blood Culture adequate volume   Culture  Setup Time   Final    GRAM POSITIVE COCCI IN CLUSTERS AEROBIC BOTTLE ONLY CRITICAL RESULT CALLED TO, READ BACK BY AND VERIFIED WITH: PHARMD C AMEND [id number] FCP    Culture (A)  Final    MICROCOCCUS SPECIES Standardized susceptibility testing for this organism is not available. Performed at [facility], [address], [place], [place] [address]    Report Status 02/26/2021 FINAL  Final  Blood Culture ID Panel (Reflexed)     Status: None   Collection Time: 02/23/21  5:28 PM  Result Value Ref Range Status   Enterococcus faecalis NOT DETECTED NOT DETECTED Final   Enterococcus Faecium NOT DETECTED NOT DETECTED Final   Listeria monocytogenes NOT DETECTED NOT DETECTED Final   Staphylococcus species NOT DETECTED NOT DETECTED Final   Staphylococcus aureus (BCID) NOT DETECTED NOT DETECTED Final   Staphylococcus epidermidis NOT DETECTED NOT DETECTED Final   Staphylococcus lugdunensis NOT DETECTED NOT DETECTED Final   Streptococcus species NOT DETECTED NOT DETECTED Final   Streptococcus agalactiae NOT DETECTED NOT DETECTED Final   Streptococcus pneumoniae NOT DETECTED NOT DETECTED  Final   Streptococcus pyogenes NOT DETECTED NOT DETECTED Final   A.calcoaceticus-baumannii NOT DETECTED NOT DETECTED Final   Bacteroides fragilis NOT DETECTED NOT DETECTED Final   Enterobacterales NOT DETECTED NOT DETECTED Final   Enterobacter cloacae complex NOT DETECTED NOT DETECTED Final   Escherichia coli NOT DETECTED NOT DETECTED Final   Klebsiella aerogenes NOT DETECTED NOT DETECTED Final   Klebsiella oxytoca NOT DETECTED NOT DETECTED Final   Klebsiella pneumoniae NOT DETECTED NOT DETECTED Final   Proteus species NOT DETECTED NOT DETECTED Final   Salmonella species NOT DETECTED NOT DETECTED Final   Serratia marcescens NOT DETECTED NOT DETECTED Final   Haemophilus influenzae NOT DETECTED NOT DETECTED Final   Neisseria meningitidis NOT DETECTED NOT DETECTED Final   Pseudomonas aeruginosa NOT DETECTED NOT DETECTED Final   Stenotrophomonas maltophilia NOT DETECTED NOT DETECTED Final   Candida albicans NOT DETECTED NOT DETECTED Final   Candida auris NOT DETECTED NOT DETECTED Final   Candida glabrata NOT DETECTED NOT DETECTED Final   Candida krusei NOT DETECTED NOT DETECTED Final   Candida parapsilosis NOT DETECTED NOT DETECTED Final   Candida tropicalis NOT DETECTED NOT DETECTED Final   Cryptococcus neoformans/gattii NOT DETECTED NOT DETECTED Final    Comment: Performed at [facility], [address], [place], [place] [address]  Resp Panel by RT-PCR (Flu A&B, Covid) Nasopharyngeal Swab     Status: None   Collection Time: 02/23/21  8:24 PM   Specimen: Nasopharyngeal Swab; Nasopharyngeal(NP) swabs in vial transport medium  Result Value Ref Range Status   SARS Coronavirus 2 by RT PCR NEGATIVE NEGATIVE Final    Comment: (NOTE) SARS-CoV-2 target nucleic acids are NOT DETECTED.  The SARS-CoV-2 RNA is generally detectable in upper respiratory specimens during the acute phase of infection. The lowest concentration of SARS-CoV-2 viral copies this assay can detect is 138 copies/mL.  A negative result does not preclude SARS-Cov-2 infection and should not be used as the sole basis for treatment or other patient management decisions. A negative result may occur with  improper specimen collection/handling, submission of specimen other than nasopharyngeal swab, presence of viral mutation(s) within the areas  targeted by this assay, and inadequate number of viral copies(<138 copies/mL). A negative result must be combined with clinical observations, patient history, and epidemiological information. The expected result is Negative.  Fact Sheet for Patients:  BloggerCourse.com  Fact Sheet for Healthcare Providers:  SeriousBroker.it  This test is no t yet approved or cleared by the [place] FDA and  has been authorized for detection and/or diagnosis of SARS-CoV-2 by FDA under an Emergency Use Authorization (EUA). This EUA will remain  in effect (meaning this test can be used) for the duration of the COVID-19 declaration under Section 564(b)(1) of the Act, 21 U.S.C.section 360bbb-3(b)(1), unless the authorization is terminated  or revoked sooner.       Influenza A by PCR NEGATIVE NEGATIVE Final   Influenza B by PCR NEGATIVE NEGATIVE Final    Comment: (NOTE) The Xpert Xpress SARS-CoV-2/FLU/RSV plus assay is intended as an aid in the diagnosis of influenza from Nasopharyngeal swab specimens and should not be used as a sole basis for treatment. Nasal washings and aspirates are unacceptable for Xpert Xpress SARS-CoV-2/FLU/RSV testing.  Fact Sheet for Patients: BloggerCourse.com  Fact Sheet for Healthcare Providers: SeriousBroker.it  This test is not yet approved or cleared by the [place] FDA and has been authorized for detection and/or diagnosis of SARS-CoV-2 by FDA under an Emergency Use Authorization (EUA). This EUA will remain in effect (meaning this test  can be used) for the duration of the COVID-19 declaration under Section 564(b)(1) of the Act, 21 U.S.C. section 360bbb-3(b)(1), unless the authorization is terminated or revoked.  Performed at [facility], [address], [place], [place] [address]   MRSA Next Gen by PCR, Nasal     Status: None   Collection Time: 02/23/21 11:37 PM   Specimen: Nasal Mucosa; Nasal Swab  Result Value Ref Range Status   MRSA by PCR Next Gen NOT DETECTED NOT DETECTED Final    Comment: (NOTE) The GeneXpert MRSA Assay (FDA approved for NASAL specimens only), is one component of a comprehensive MRSA colonization surveillance program. It is not intended to diagnose MRSA infection nor to guide or monitor treatment for MRSA infections. Test performance is not FDA approved in patients less than 20 years old. Performed at [facility], [address] [place], [place] [address]   Blood culture (routine x 2)     Status: None (Preliminary result)   Collection Time: 02/24/21  2:04 AM   Specimen: BLOOD LEFT HAND  Result Value Ref Range Status   Specimen Description BLOOD LEFT HAND  Final   Special Requests   Final    BOTTLES DRAWN AEROBIC ONLY Blood Culture adequate volume   Culture   Final    NO GROWTH 4 DAYS Performed at [facility], [address], [place], [place] [address]    Report Status PENDING  Incomplete          Radiology Studies: CT PELVIS W CONTRAST  Result Date: 02/27/2021 CLINICAL DATA:  Sepsis EXAM: CT PELVIS WITH CONTRAST TECHNIQUE: Multidetector CT imaging of the pelvis was performed using the standard protocol following the bolus administration of intravenous contrast. CONTRAST:  68mL OMNIPAQUE IOHEXOL 350 MG/ML SOLN COMPARISON:  11/25/2020, 12/30/2020 FINDINGS: Urinary Tract:  Distal ureters and bladder are grossly unremarkable. Bowel: No bowel obstruction or ileus. Moderate fecal retention within the distal colon. No bowel wall thickening or inflammatory change.  Vascular/Lymphatic: Mild atherosclerosis.  No pathologic adenopathy. Reproductive:  No mass or other significant abnormality Other:  There is no free intraperitoneal fluid or free gas. Interval development of moderate presacral edema of uncertain etiology. No abdominal wall hernia. Within the inferior margin of the left inguinal crease there is a focal area of subcutaneous fat stranding measuring 9 mm, which could reflect a small phlegmon. This is best seen on image 38/3. Musculoskeletal: There are no acute or destructive bony lesions. There is subcutaneous fat stranding overlying the sacrococcygeal junction without frank ulceration or tissue defect. No fluid collection or abscess. Reconstructed images demonstrate no additional findings. IMPRESSION: 1. Small 9 mm subcutaneous phlegmon or abscess within the left inguinal fold. 2. Subcutaneous fat stranding overlying the sacrococcygeal junction which could reflect developing changes of decubitus ulcer. No frank ulceration or soft tissue defect at this time. 3. Presacral edema, new since prior study, of uncertain etiology or significance. 4. Moderate fecal retention. 5.  Aortic Atherosclerosis (ICD10-I70.0). Electronically Signed   By: Sharlet Salina M.D.   On: 02/27/2021 16:37        Scheduled Meds:  atorvastatin  40 mg Per Tube QHS   docusate  100 mg Per Tube BID   heparin  5,000 Units Subcutaneous Q8H   insulin aspart  0-9 Units Subcutaneous Q4H   [START ON 03/01/2021] insulin detemir  10 Units Subcutaneous Daily   insulin detemir  3 Units Subcutaneous Once   mouth rinse  15 mL Mouth Rinse BID   metoprolol tartrate  12.5 mg Per Tube BID   multivitamin with minerals  1 tablet Per Tube Daily   saccharomyces boulardii  250 mg Oral BID   sodium chloride flush  10-40 mL Intracatheter Q12H   Continuous Infusions:  cefTRIAXone (ROCEPHIN)  IV 2 g (02/28/21 0859)   feeding supplement (OSMOLITE 1.2 CAL) 1,000 mL (02/27/21 1451)   metronidazole 500 mg  (02/28/21 0530)   sodium chloride       LOS: 5 days    Time spent: 35 minutes.     Alba Cory, MD [facility]   If 7PM-7AM, please contact night-coverage [email or web address]  02/28/2021, 11:31 AM

## 2021-02-28 NOTE — Social Work (Addendum)
  CSW called and spoke with pt's mother Christine Benitez to discuss dc planning needs.CSW mentioned the recommendation of a SNF. CSW tried to ascertain pt's insurance because on her face sheet it is medicaid pending. [name] explained that she was unsure of pt's insurance however would follow up with pt's daughter about the insurance.  Christine Benitez stated that pt has had [name] [facility] in the past.CSW shared that there are barriers with finding placement without insurance being known. [name] informed CSW that she wants pt to go to facility because daughter and her cannot care for pt. [name] explained that she does not have the resources for the pt's care. Pt's discharge plan is still unknown however CSW will follow up tomorrow with [name].

## 2021-02-28 NOTE — Progress Notes (Signed)
Palliative Medicine Inpatient Follow Up Note  Consulting Provider: Luciano Cutter, MD   Reason for consult:   Palliative Care Consult Services Palliative Medicine Consult  Reason for Consult? Seen by palliative as outpatient. Readmission for DKA. Brittle DKA    HPI:  Per intake H&P --> 60 year old with past medical history significant for diabetes, hypertension, nonischemic cardiomyopathy, pulmonary hypertension, CVA, history of cardiogenic shock, AKI, encephalopathy, cachexia, failure to thrive multiple admissions for DKA.  Presents to [facility] ED 10/17 with altered mental status and hyperglycemia. Patient was started on tube feeding on 10/19 due to poor oral intake.  Palliative care consulted for goals of care.   The Christine Benitez Palliative care team has seen Christine Benitez multiple times from November 2021, June 2022, & July 2022. She is followed by OP Palliative support through [facility] though per  note review she has not been seen since August 2022.   Today's Discussion (02/28/2021):  *Please note that this is a verbal dictation therefore any spelling or grammatical errors are due to the "Dragon Medical One" system interpretation.  Chart reviewed.  Christine Benitez has remained on supportive measures through nasogastric tube feeding.    I spoke with Christine Benitez's mother and explained to her my concerns about Christine Benitez's declining health state over the past 6 months.  I expressed that Christine Benitez has a significant medical history and active medical problems which I fear will be resolvable given that she has had recurrent hospitalizations and has malingering encephalopathy secondary to that.  She is also suffered from very poorly controlled diabetes and presently failure to thrive.  Christine Benitez shares that Christine Benitez has really been declining in since her last stroke Christine Benitez feels like Christine Benitez has lost hope in life.  Christine Benitez states that Christine Benitez was a very proud woman who would wake up put her hair and make-up on and enjoyed being able to do things for  herself.  This is dramatically changed in the last few months where as she has become increasingly dependent upon other people for care needs.  Christine Benitez shares that this has been very very difficult on Christine Benitez.   Christine Benitez went on to share with me her concerns about Christine Benitez after her hospitalization in [place] whereby she had a diabetic coma and was unresponsive for greater than 3 weeks.  Christine Benitez feels that Christine Benitez has not been as well functioning from a cognitive state since then.  Christine Benitez expresses that she has been the want to intervene and make decisions as Christine Benitez overestimates her ability to care for Christine Benitez at this time given that she herself has severe brittle diabetes.  I asked Christine Benitez if she had considered that Christine Benitez is encroaching a point where hospice would potentially be the next best step.  I described hospice as a service for patients for have a life expectancy of < 6 months. It preserves dignity and quality at the end phases of life. The focus changes from curative to symptom relief.  Christine Benitez expresses that she honestly feels this would be the best thing for Christine Benitez given her declining health and overall poor functional state.  We reviewed the idea of a long-term gastrostomy tube which days he was also apprehensive about I shared the risks and benefits of such a intervention in the long run.  Christine Benitez did not seem convinced that this would benefit Christine Benitez.  We reviewed the next steps to have a hospice liaison speak to Christine Benitez to better explain their services.  Christine Benitez shares that there is no way Christine Benitez will  be able to care for Christine Benitez again in the future and she will need long-term placement.  We reviewed again the idea of hospice caring for Christine Benitez and the skilled nursing facility as this would preclude her from needing to come back and forth to the hospital and allow her to have as much dignity and quality as she could maintain at this juncture in her life.  Questions and concerns addressed   Objective  Assessment: Vital Signs Vitals:   02/28/21 0746 02/28/21 0853  BP: 126/67 (!) 101/59  Pulse: 100 82  Resp: 13   Temp: 99.9 F (37.7 C)   SpO2: 100%     Intake/Output Summary (Last 24 hours) at 02/28/2021 1408 Last data filed at 02/28/2021 1345 Gross per 24 hour  Intake 201.09 ml  Output --  Net 201.09 ml   Last Weight  Most recent update: 02/28/2021  3:16 AM    Weight  41.2 kg (90 lb 14.4 oz)            Gen: Elderly female appears older than stated age HEENT: Dry mucous membranes, core track is in place CV: Regular rate and rhythm PULM: On room air ABD: soft/nontender EXT: No edema Neuro: Alert and oriented x2  SUMMARY OF RECOMMENDATIONS   DNAR/DNI   MOST and DNR form are on Vynca   Patient's mother does not think a gastrostomy tube would be of benefit to her   Being followed as an outpatient by [facility] - appreciate additional conversations on their hospice services given patient's poor overall clinical condition and ongoing failure to thrive  MSW - Appreciate support to identify the safest placement option from hospitalization   Ongoing palliative support while inpatient  Time Spent: 50 Greater than 50% of the time was spent in counseling and coordination of care ______________________________________________________________________________________ Lamarr Lulas [place] Palliative Medicine Team Team Cell Phone: [phone] Please utilize secure chat with additional questions, if there is no response within 30 minutes please call the above phone number  Palliative Medicine Team providers are available by phone from 7am to 7pm daily and can be reached through the team cell phone.  Should this patient require assistance outside of these hours, please call the patient's attending physician.

## 2021-02-28 NOTE — TOC Progression Note (Signed)
Transition of Care [facility]) - Progression Note    Patient Details  Name: Christine Benitez MRN: [id number] Date of Birth: Nov 23, 1960  [facility]) CM/SW Contact  Patrice Paradise, LCSW Phone Number: [phone] 02/28/2021, 12:04 PM  Clinical Narrative:     CSW attempted to call pt's daughter Duwayne Heck in regards to pt's dc planning needs and to discuss the SNF recommendation. Pt is only oriented to self. Danielle's phone does not have voice mail set up therefore sent a HIPPA compliant text.  TOC team will continue to assist with discharge planning needs.        Expected Discharge Plan and Services                                                 Social Determinants of Health (SDOH) Interventions    Readmission Risk Interventions Readmission Risk Prevention Plan 11/18/2020 11/06/2020  Transportation Screening Complete Complete  PCP or Specialist Appt within 5-7 Days - Not Complete  Not Complete comments - July 29  PCP or Specialist Appt within 3-5 Days Complete -  Home Care Screening - Complete  Medication Review (RN CM) - Complete  HRI or Home Care Consult Complete -  Social Work Consult for Recovery Care Planning/Counseling Complete -  Palliative Care Screening Complete -  Medication Review Oceanographer) Complete -  Some recent data might be hidden

## 2021-02-28 NOTE — Progress Notes (Signed)
Patient had loose stools x6. CO being sore in her perineal and sacral area from the irritation. Pt. States "stop hurting me your rubbing too hard" when trying to clean her up. Also calling out "help me" or "help"  since she was so uncomfortable.  Placed flexiseal per orders Pt. tolerated well.

## 2021-02-28 NOTE — Progress Notes (Addendum)
Nutrition Follow-up / Consult  DOCUMENTATION CODES:   Severe malnutrition in context of chronic illness, Underweight  INTERVENTION:   Continue Ensure Enlive po TID, each supplement provides 350 kcal and 20 grams of protein. Continue Magic cup TID with meals, each supplement provides 290 kcal and 9 grams of protein. Continue MVI with minerals daily. TF via Cortrak tube: change to Glucerna 1.2 at 50 ml/h to provide 1440 kcal, 72 gm protein, 966 ml free water daily.  NUTRITION DIAGNOSIS:   Severe Malnutrition related to chronic illness (DM with frequent DKA, NICM, CHF) as evidenced by severe muscle depletion, severe fat depletion, percent weight loss (16% weight loss within 3 months).  Ongoing  GOAL:   Patient will meet greater than or equal to 90% of their needs  Progressing   MONITOR:   PO intake, TF tolerance, Labs  REASON FOR ASSESSMENT:   Consult Enteral/tube feeding initiation and management  ASSESSMENT:   60 yo female admitted with AMS, DKA. PMH includes DM, frequent admissions for DKA, HTN, NICM, pulmonary HTN, chronic cholecystitis with calculus, anemia, CHF, tobacco abuse.  Cortrak feeding tube placed 10/19. Tip is at the duodenal bulb.  Currently receiving Osmolite 1.2 at 50 ml/h.  Received consult regarding concern for tube feeding causing diarrhea. One type 7, large, brown BM documented today at 11am. Multiple loose stools documented in GI Symptoms earlier this morning.  Diarrhea could be r/t medications, including IV antibiotics, Colace, and K-Phos. Colace was given once on 10/20 and twice on 10/21.  Florastor added today.  Rectal tube has been ordered. Will change TF to a fiber containing formula to help decrease loose stools.   Diet: dysphagia 3 with thin liquids Meal intakes: 0-10% recorded 10/19  Labs reviewed. K and Phos WNL today  CBG: [phone]  Medications reviewed and include Colace, Novolog SSI q 4 hours, Levemir, MVI with minerals,  K-phos, Florastor, IV Rocephin, IV Flagyl.  Admission weight 37.1 kg Current weight 41.2 kg  Diet Order:   Diet Order             DIET DYS 3 Room service appropriate? No; Fluid consistency: Thin  Diet effective now                   EDUCATION NEEDS:   Not appropriate for education at this time  Skin:  Skin Assessment: Skin Integrity Issues: Skin Integrity Issues:: Other (Comment) Other: non pressure wound to groin  Last BM:  10/22  Height:   Ht Readings from Last 1 Encounters:  02/27/21 5\' 5"  (1.651 m)    Weight:   Wt Readings from Last 1 Encounters:  02/28/21 41.2 kg    BMI:  Body mass index is 15.13 kg/m.  Estimated Nutritional Needs:   Kcal:  1350-1550  Protein:  60-70 gm  Fluid:  1.4-1.6 L    03/02/21, RD, LDN, CNSC Please refer to Amion for contact information.

## 2021-03-01 LAB — MAGNESIUM: Magnesium: 1.7 mg/dL (ref 1.7–2.4)

## 2021-03-01 LAB — BASIC METABOLIC PANEL
Anion gap: 6 (ref 5–15)
BUN: 11 mg/dL (ref 6–20)
CO2: 27 mmol/L (ref 22–32)
Calcium: 8.4 mg/dL — ABNORMAL LOW (ref 8.9–10.3)
Chloride: 102 mmol/L (ref 98–111)
Creatinine, Ser: 0.77 mg/dL (ref 0.44–1.00)
GFR, Estimated: >60 mL/min (ref >60)
Glucose, Bld: 146 mg/dL — ABNORMAL HIGH (ref 70–99)
Potassium: 4.9 mmol/L (ref 3.5–5.1)
Sodium: 135 mmol/L (ref 135–145)

## 2021-03-01 LAB — CULTURE, BLOOD (ROUTINE X 2)
Culture: NO GROWTH
Special Requests: ADEQUATE

## 2021-03-01 LAB — CBC
HCT: 22.7 % — ABNORMAL LOW (ref 36.0–46.0)
Hemoglobin: 7.4 g/dL — ABNORMAL LOW (ref 12.0–15.0)
MCH: 31.6 pg (ref 26.0–34.0)
MCHC: 32.6 g/dL (ref 30.0–36.0)
MCV: 97 fL (ref 80.0–100.0)
Platelets: 230 10*3/uL (ref 150–400)
RBC: 2.34 MIL/uL — ABNORMAL LOW (ref 3.87–5.11)
RDW: 14.9 % (ref 11.5–15.5)
WBC: 6.6 10*3/uL (ref 4.0–10.5)
nRBC: 0 % (ref 0.0–0.2)

## 2021-03-01 LAB — GLUCOSE, CAPILLARY
Glucose-Capillary: 174 mg/dL — ABNORMAL HIGH (ref 70–99)
Glucose-Capillary: 220 mg/dL — ABNORMAL HIGH (ref 70–99)
Glucose-Capillary: 87 mg/dL (ref 70–99)
Glucose-Capillary: 195 mg/dL — ABNORMAL HIGH (ref 70–99)
Glucose-Capillary: 80 mg/dL (ref 70–99)

## 2021-03-01 LAB — PHOSPHORUS: Phosphorus: 2.9 mg/dL (ref 2.5–4.6)

## 2021-03-01 MED ORDER — ATORVASTATIN CALCIUM 40 MG PO TABS
40.0000 mg | ORAL_TABLET | Freq: Every day | ORAL | Status: DC
  Administered 2021-03-02 – 2021-03-03 (×3): 40 mg via ORAL
  Filled 2021-03-01 (×3): qty 1

## 2021-03-01 MED ORDER — SACCHAROMYCES BOULARDII 250 MG PO CAPS
250.0000 mg | ORAL_CAPSULE | Freq: Two times a day (BID) | ORAL | Status: DC
  Administered 2021-03-02 – 2021-03-04 (×6): 250 mg via ORAL
  Filled 2021-03-01 (×7): qty 1

## 2021-03-01 MED ORDER — DOCUSATE SODIUM 50 MG/5ML PO LIQD
100.0000 mg | Freq: Two times a day (BID) | ORAL | Status: DC
  Administered 2021-03-02 (×2): 100 mg via ORAL
  Filled 2021-03-01 (×3): qty 10

## 2021-03-01 MED ORDER — METOPROLOL TARTRATE 12.5 MG HALF TABLET
12.5000 mg | ORAL_TABLET | Freq: Two times a day (BID) | ORAL | Status: DC
  Administered 2021-03-02: 12.5 mg via ORAL
  Filled 2021-03-01 (×2): qty 1

## 2021-03-01 MED ORDER — ADULT MULTIVITAMIN W/MINERALS CH
1.0000 | ORAL_TABLET | Freq: Every day | ORAL | Status: DC
  Administered 2021-03-02 – 2021-03-04 (×3): 1 via ORAL
  Filled 2021-03-01 (×3): qty 1

## 2021-03-01 MED ORDER — MAGNESIUM SULFATE 2 GM/50ML IV SOLN
2.0000 g | Freq: Once | INTRAVENOUS | Status: AC
  Administered 2021-03-01: 2 g via INTRAVENOUS
  Filled 2021-03-01: qty 50

## 2021-03-01 MED ORDER — K PHOS MONO-SOD PHOS DI & MONO 155-852-130 MG PO TABS
250.0000 mg | ORAL_TABLET | Freq: Two times a day (BID) | ORAL | Status: DC
  Administered 2021-03-02 (×2): 250 mg via ORAL
  Filled 2021-03-01 (×3): qty 1

## 2021-03-01 NOTE — Social Work (Signed)
  CSW spoke with pt's daughter Christine Benitez and followed up about pt's insurance. Christine Benitez states that pt currently does not have any insurance.She stated that she applied for Medicaid about a week ago and that can be a lengthy process.  CSW confirmed that pt was living with her PTA. Due to the chart stating that pt is only oriented to self, CSW discussed the recommendation of a SNF. Christine Benitez states that she prefers pt to come home with her.  Pt not having insurance will be a barrier for SNF. CSW notified MD, RN and Palliative NP of such barriers and the wishes of daughter to bring her home. I know that pt's mother discussed capacity of pt's daughter making decisions however pt's daughter is next in line for decisions.  TOC will continue to explore feasible options for pt's DC planning needs.

## 2021-03-01 NOTE — Evaluation (Signed)
Clinical/Bedside Swallow Evaluation Patient Details  Name: Christine Benitez MRN: [id number] Date of Birth: 04/21/1961  Today's Date: 03/01/2021 Time: SLP Start Time (ACUTE ONLY): 1500 SLP Stop Time (ACUTE ONLY): 1520 SLP Time Calculation (min) (ACUTE ONLY): 20 min  Past Medical History:  Past Medical History:  Diagnosis Date   Abnormal LFTs    a. 05/2014 -  ALT 52, alk phos 130.   Chronic systolic CHF (congestive heart failure) ([place])    a. Dx 05/2014 - echo at [facility] - EF 30-35%, moderate LVH, no rWMA, mild to moderate MR, moderate PH with PA pressure 60-32mHg, mild to moderate pericardial effusion. EF 30-35% by cath.   History of blood transfusion 1986   "related to C-section"   Hypertension    Insulin dependent diabetes mellitus    Iron deficiency anemia    [place]mic [place]) [place]    [place]  minor nonobstructive CAD with mild irregularities in the LAD less than 10-20%, LCx with mild disease less than 20%, no significant disease in the RCA, EF 30-35%.   Pericardial effusion    a. [facility]   [place]    [facility]   Tobacco abuse    Past Surgical History:  Past Surgical History:  Procedure Laterality Date   APPENDECTOMY  ~ 2003   [place] 06/[place]/2014   Procedure: LEFT HEART CATHETERIZATION WITH CORONARY ANGIOGRAM;  Surgeon: Peter M JMartinique MD;  [facility];  Service: Cardiovascular;  Laterality: N/A;   HPI:  Patient is a 60y.o. female with PMH: DM, HTN, nonischemic cardiomyopathy, pulmonary hypertension, CVA, history of cardiogenic shock, AKI, encephalopathy, cachexia, failure to thrive multiple admissions for DKA. [facility] and hyperglycemia (glucose 486). She has been followed by palliative care as an outpatient. NG was placed 10/19 due to poor oral intake but  removed 10/23 due to patient/family request. Bedside swallow evaluation requested due to concern for patient's ability to safely masticate and swallow solids and liquids.    Assessment / Plan / Recommendation  Clinical Impression  Patient presents with clinical s/s of an oral dysphagia but without overt s/s indicating a pharyngeal phase dysphagia. She consumed two pieces of soft solids (soft bread and deli tKuwaitsandwich) broken into small pieces and exhibited mastication and oral transit delays, however oral phase impacted by her lack of dentition. Sips of liquids helped to clear oral residuals. Swallow initiation appeared timely and patient did not exhibit any coughing or throat clearing even with successive straw sips of thin liquids. Of note, patient had MBS during previous admission 11/26/2020 and at that time, her oral and pharyngeal swallow [facility] SLP will follow briefly for diet toleration and if any need for family education. Recommendation is for regular texture solids to give patient options due to poor intake, but she will do best with softer solids and hard, tough to chew solids should be avoided. SLP Visit Diagnosis: Dysphagia, unspecified (R13.10)    Aspiration Risk  No limitations;Mild aspiration risk    Diet Recommendation Regular;Thin liquid   Liquid Administration via: Cup;Straw Medication Administration: Whole meds with puree Supervision: Full supervision/cueing for compensatory strategies;Staff to assist with self feeding Compensations: Slow rate;Small sips/bites;Lingual sweep for clearance of pocketing    Other  Recommendations Oral Care Recommendations: Oral care BID;Other (Comment) (check  for oral residuals after PO intake)    Recommendations for follow up therapy are one component of a multi-disciplinary discharge planning process, led by the attending physician.  Recommendations may be updated based on patient status, additional functional  criteria and insurance authorization.  Follow up Recommendations None      Frequency and Duration min 1 x/week  1 week       Prognosis Prognosis for Safe Diet Advancement: Good      Swallow Study   General Date of Onset: 03/01/21 HPI: Patient is a 60 y.o. female with PMH: DM, HTN, nonischemic cardiomyopathy, pulmonary hypertension, CVA, history of cardiogenic shock, AKI, encephalopathy, cachexia, failure to thrive multiple admissions for DKA. She presented to [facility] ED 10/17  with AMS and hyperglycemia (glucose 486). She has been followed by palliative care as an outpatient. NG was placed 10/19 due to poor oral intake but removed 10/23 due to patient/family request. Bedside swallow evaluation requested due to concern for patient's ability to safely masticate and swallow solids and liquids. Type of Study: Bedside Swallow Evaluation Previous Swallow Assessment: MBS during previous admission, 11/26/2020, recommending regular solids, thin liquids Diet Prior to this Study: Regular;Thin liquids Temperature Spikes Noted: Yes (99.9) Respiratory Status: Room air History of Recent Intubation: No Behavior/Cognition: Alert;Cooperative;Pleasant mood Oral Cavity Assessment: Within Functional Limits Oral Care Completed by SLP: Yes Oral Cavity - Dentition: Edentulous Self-Feeding Abilities: Total assist Patient Positioning: Upright in bed Baseline Vocal Quality: Normal Volitional Cough: Cognitively unable to elicit Volitional Swallow: Able to elicit    Oral/Motor/Sensory Function Overall Oral Motor/Sensory Function: Within functional limits   Ice Chips     Thin Liquid Thin Liquid: Within functional limits Presentation: Straw    Nectar Thick     Honey Thick     Puree Puree: Within functional limits Presentation: Spoon   Solid     Solid: Impaired Oral Phase Impairments: Impaired mastication Oral Phase Functional Implications: Impaired mastication;Prolonged oral transit      Sonia Baller, MA, CCC-SLP Speech Therapy

## 2021-03-01 NOTE — Progress Notes (Signed)
PROGRESS NOTE    Christine Benitez  ZDG:[id number] DOB: March 16, 1961 DOA: 02/23/2021 PCP: Lupita Dawn, MD   Brief Narrative: 60 year old with past medical history significant for diabetes, hypertension, nonischemic cardiomyopathy, pulmonary hypertension, CVA, history of cardiogenic shock, AKI, encephalopathy, cachexia, failure to thrive multiple admissions for DKA.  Presents to [facility], ED 10/17 with altered mental status and hyperglycemia.  In the ED patient was altered, CO2 less than 7, glucose 486, lactic acid 3.7, beta hydroxybutyric acid more than 8.  She was given 2 L of LR and insulin infusion.  Patient was admitted by CCM.  Care transferred to [facility] 30/20/2022.  Family confirmed DNR status.  Patient has been followed by palliative care as an outpatient.  Patient was started on tube feeding on 10/19 due to poor oral intake.  Palliative care consulted for goals of care.   Assessment & Plan:   Active Problems:   DKA (diabetic ketoacidosis) (HCC)   Encephalopathy acute   Metabolic acidosis  1-DKA: Unclear if patient has been using insulin. She was transitioned to Levemir 8 units twice daily with subsequently discontinued due to CBG in the 70s.   Continue  Levemir 10 units daily Daily with a sliding scale insulin Ac 13.   2-Sepsis: patient presented with tachycardia, fevers, leukocytosis wbc 18./  1-3 Blood culture grew Micococcus species, CT pelvis: Small 9 mm subcutaneous phlegmon or abscess within the left inguinal fold.  This Discontinued to drain Chest x ray: Atelectasis at the bases. UA: Negative white blood cell Urine culture was not sent. Continue with Ceftriaxone and flagyl day 6 Abscess smaller.   Anemia; probably anemia of acute illness and chronic disease B12: 737, iron 79, ferritin 423.  Folic acid 6.5 Follow trend. Blood transfusion for hb less than 7  Acute metabolic encephalopathy: Status likely related to DKA and metabolic derangement.  Acute on chronic.    AKI: Resolved with IV fluid  Diastolic heart failure, nonischemic cardiomyopathy: Holding aspirin and Plavix today due to drop in hemoglobin  Severe protein caloric malnutrition: See below She was a started on tube feedings in the ICU.   Palliative care consulted for goals of care.  Palliative care discussed with patient mother, no plan for G tube or tube feeding. Stop tube feeding.   History of CVA: holding aspirin/plavix due to anemia   Nutrition Problem: Severe Malnutrition Etiology: chronic illness (DM with frequent DKA, NICM, CHF)    Signs/Symptoms: severe muscle depletion, severe fat depletion, percent weight loss (16% weight loss within 3 months) Percent weight loss: 16 %    Interventions: Tube feeding, Ensure Enlive (each supplement provides 350kcal and 20 grams of protein), MVI, Magic cup  Estimated body mass index is 14.97 kg/m as calculated from the following:   Height as of this encounter: 5\' 5"  (1.651 m).   Weight as of this encounter: 40.8 kg.   DVT prophylaxis: Heparin Code Status: DNR Family Communication: Daughter over phone 10/20 Disposition Plan:  Status is: Inpatient  Remains inpatient appropriate because:  DKA hyperglycemia        Consultants:  CCM admitted patient.  Palliative care.   Procedures:    Antimicrobials:    Subjective: Alert, denies pain.   Objective: Vitals:   03/01/21 0440 03/01/21 0712 03/01/21 1021 03/01/21 1100  BP: 105/62 (!) 98/51 101/62 110/60  Pulse: 78 81 84 78  Resp: 16 12  17   Temp: 98.7 F (37.1 C) 98.5 F (36.9 C)  99.4 F (37.4 C)  TempSrc: Oral Oral  Oral  SpO2: 100% 99%  100%  Weight: 40.8 kg     Height:        Intake/Output Summary (Last 24 hours) at 03/01/2021 1512 Last data filed at 03/01/2021 1449 Gross per 24 hour  Intake 758.33 ml  Output 1450 ml  Net -691.67 ml    Filed Weights   02/27/21 0452 02/28/21 0314 03/01/21 0440  Weight: 40.3 kg 41.2 kg 40.8 kg     Examination:  General exam: Frail cachetic Respiratory system: CTA Cardiovascular system: S 1, S 2 RRR Gastrointestinal system: BS present, soft, nt Central nervous system: alert Extremities: no edema Pubic area; small open are left inguinal fold, drain some purulence.   Data Reviewed: I have personally reviewed following labs and imaging studies  CBC: Recent Labs  Lab 02/23/21 1547 02/23/21 2201 02/26/21 0419 02/27/21 0143 02/28/21 0226 03/01/21 0204  WBC 19.1* 18.0* 5.3 5.0 6.0 6.6  NEUTROABS 16.3*  --   --   --   --   --   HGB 11.4* 10.5* 7.7* 7.5* 7.1* 7.4*  HCT 39.4 35.3* 22.9* 23.2* 22.1* 22.7*  MCV 105.6* 103.5* 93.9 95.1 96.5 97.0  PLT 329 236 144* 150 189 230    Basic Metabolic Panel: Recent Labs  Lab 02/25/21 1746 02/25/21 2156 02/26/21 0419 02/26/21 1600 02/27/21 0143 02/28/21 0226 03/01/21 0204  NA 138  --  137 136  --  135 135  K 3.4*  --  3.6 4.6  --  4.7 4.9  CL 102  --  101 103  --  105 102  CO2 24  --  28 25  --  26 27  GLUCOSE 128*  --  144* 223*  --  264* 146*  BUN 8  --  13 14  --  14 11  CREATININE 0.75  --  0.73 0.78  --  0.84 0.77  CALCIUM 8.0*  --  8.1* 8.3*  --  8.1* 8.4*  MG 2.0  --  2.0 2.0 1.9 1.9 1.7  PHOS 2.1*   < > 2.8 1.7* 5.0* 2.6 2.9   < > = values in this interval not displayed.    GFR: Estimated Creatinine Clearance: 48.8 mL/min (by C-G formula based on SCr of 0.77 mg/dL). Liver Function Tests: Recent Labs  Lab 02/23/21 1547  AST 15  ALT 9  ALKPHOS 61  BILITOT 1.8*  PROT 7.3  ALBUMIN 3.7    No results for input(s): LIPASE, AMYLASE in the last 168 hours. No results for input(s): AMMONIA in the last 168 hours. Coagulation Profile: No results for input(s): INR, PROTIME in the last 168 hours. Cardiac Enzymes: No results for input(s): CKTOTAL, CKMB, CKMBINDEX, TROPONINI in the last 168 hours. BNP (last 3 results) No results for input(s): PROBNP in the last 8760 hours. HbA1C: No results for input(s): HGBA1C  in the last 72 hours.  CBG: Recent Labs  Lab 02/28/21 1940 02/28/21 2351 03/01/21 0437 03/01/21 0710 03/01/21 1203  GLUCAP 90 80 174* 220* 87    Lipid Profile: No results for input(s): CHOL, HDL, LDLCALC, TRIG, CHOLHDL, LDLDIRECT in the last 72 hours. Thyroid Function Tests: No results for input(s): TSH, T4TOTAL, FREET4, T3FREE, THYROIDAB in the last 72 hours.  Anemia Panel: No results for input(s): VITAMINB12, FOLATE, FERRITIN, TIBC, IRON, RETICCTPCT in the last 72 hours.  Sepsis Labs: Recent Labs  Lab 02/23/21 1555 02/24/21 0700  LATICACIDVEN 3.7* 1.9     Recent Results (from the past 240 hour(s))  Blood culture (routine  x 2)     Status: Abnormal   Collection Time: 02/23/21  5:28 PM   Specimen: BLOOD  Result Value Ref Range Status   Specimen Description BLOOD CENTRAL LINE  Final   Special Requests   Final    BOTTLES DRAWN AEROBIC AND ANAEROBIC Blood Culture adequate volume   Culture  Setup Time   Final    GRAM POSITIVE COCCI IN CLUSTERS AEROBIC BOTTLE ONLY CRITICAL RESULT CALLED TO, READ BACK BY AND VERIFIED WITH: PHARMD C AMEND [id number] FCP    Culture (A)  Final    MICROCOCCUS SPECIES Standardized susceptibility testing for this organism is not available. Performed at [facility], [address] [place], [place] [address]    Report Status 02/26/2021 FINAL  Final  Blood Culture ID Panel (Reflexed)     Status: None   Collection Time: 02/23/21  5:28 PM  Result Value Ref Range Status   Enterococcus faecalis NOT DETECTED NOT DETECTED Final   Enterococcus Faecium NOT DETECTED NOT DETECTED Final   Listeria monocytogenes NOT DETECTED NOT DETECTED Final   Staphylococcus species NOT DETECTED NOT DETECTED Final   Staphylococcus aureus (BCID) NOT DETECTED NOT DETECTED Final   Staphylococcus epidermidis NOT DETECTED NOT DETECTED Final   Staphylococcus lugdunensis NOT DETECTED NOT DETECTED Final   Streptococcus species NOT DETECTED NOT DETECTED Final    Streptococcus agalactiae NOT DETECTED NOT DETECTED Final   Streptococcus pneumoniae NOT DETECTED NOT DETECTED Final   Streptococcus pyogenes NOT DETECTED NOT DETECTED Final   A.calcoaceticus-baumannii NOT DETECTED NOT DETECTED Final   Bacteroides fragilis NOT DETECTED NOT DETECTED Final   Enterobacterales NOT DETECTED NOT DETECTED Final   Enterobacter cloacae complex NOT DETECTED NOT DETECTED Final   Escherichia coli NOT DETECTED NOT DETECTED Final   Klebsiella aerogenes NOT DETECTED NOT DETECTED Final   Klebsiella oxytoca NOT DETECTED NOT DETECTED Final   Klebsiella pneumoniae NOT DETECTED NOT DETECTED Final   Proteus species NOT DETECTED NOT DETECTED Final   Salmonella species NOT DETECTED NOT DETECTED Final   Serratia marcescens NOT DETECTED NOT DETECTED Final   Haemophilus influenzae NOT DETECTED NOT DETECTED Final   Neisseria meningitidis NOT DETECTED NOT DETECTED Final   Pseudomonas aeruginosa NOT DETECTED NOT DETECTED Final   Stenotrophomonas maltophilia NOT DETECTED NOT DETECTED Final   Candida albicans NOT DETECTED NOT DETECTED Final   Candida auris NOT DETECTED NOT DETECTED Final   Candida glabrata NOT DETECTED NOT DETECTED Final   Candida krusei NOT DETECTED NOT DETECTED Final   Candida parapsilosis NOT DETECTED NOT DETECTED Final   Candida tropicalis NOT DETECTED NOT DETECTED Final   Cryptococcus neoformans/gattii NOT DETECTED NOT DETECTED Final    Comment: Performed at [facility], [address] [place], [place] [address]  Resp Panel by RT-PCR (Flu A&B, Covid) Nasopharyngeal Swab     Status: None   Collection Time: 02/23/21  8:24 PM   Specimen: Nasopharyngeal Swab; Nasopharyngeal(NP) swabs in vial transport medium  Result Value Ref Range Status   SARS Coronavirus 2 by RT PCR NEGATIVE NEGATIVE Final    Comment: (NOTE) SARS-CoV-2 target nucleic acids are NOT DETECTED.  The SARS-CoV-2 RNA is generally detectable in upper respiratory specimens during the acute  phase of infection. The lowest concentration of SARS-CoV-2 viral copies this assay can detect is 138 copies/mL. A negative result does not preclude SARS-Cov-2 infection and should not be used as the sole basis for treatment or other patient management decisions. A negative result may occur with  improper specimen collection/handling, submission of specimen other than nasopharyngeal swab, presence of viral mutation(s) within the areas targeted by this assay, and inadequate number of viral copies(<138 copies/mL). A negative result must be combined with clinical observations, patient history, and epidemiological information. The expected result is Negative.  Fact Sheet for Patients:  BloggerCourse.com  Fact Sheet for Healthcare Providers:  SeriousBroker.it  This test is no t yet approved or cleared by the [place] FDA and  has been authorized for detection and/or diagnosis of SARS-CoV-2 by FDA under an Emergency Use Authorization (EUA). This EUA will remain  in effect (meaning this test can be used) for the duration of the COVID-19 declaration under Section 564(b)(1) of the Act, 21 U.S.C.section 360bbb-3(b)(1), unless the authorization is terminated  or revoked sooner.       Influenza A by PCR NEGATIVE NEGATIVE Final   Influenza B by PCR NEGATIVE NEGATIVE Final    Comment: (NOTE) The Xpert Xpress SARS-CoV-2/FLU/RSV plus assay is intended as an aid in the diagnosis of influenza from Nasopharyngeal swab specimens and should not be used as a sole basis for treatment. Nasal washings and aspirates are unacceptable for Xpert Xpress SARS-CoV-2/FLU/RSV testing.  Fact Sheet for Patients: BloggerCourse.com  Fact Sheet for Healthcare Providers: SeriousBroker.it  This test is not yet approved or cleared by the [place] FDA and has been authorized for detection and/or diagnosis of  SARS-CoV-2 by FDA under an Emergency Use Authorization (EUA). This EUA will remain in effect (meaning this test can be used) for the duration of the COVID-19 declaration under Section 564(b)(1) of the Act, 21 U.S.C. section 360bbb-3(b)(1), unless the authorization is terminated or revoked.  Performed at [facility], [address] [place], [place] [address]   MRSA Next Gen by PCR, Nasal     Status: None   Collection Time: 02/23/21 11:37 PM   Specimen: Nasal Mucosa; Nasal Swab  Result Value Ref Range Status   MRSA by PCR Next Gen NOT DETECTED NOT DETECTED Final    Comment: (NOTE) The GeneXpert MRSA Assay (FDA approved for NASAL specimens only), is one component of a comprehensive MRSA colonization surveillance program. It is not intended to diagnose MRSA infection nor to guide or monitor treatment for MRSA infections. Test performance is not FDA approved in patients less than 91 years old. Performed at [facility], [address], [place], [place] [address]   Blood culture (routine x 2)     Status: None   Collection Time: 02/24/21  2:04 AM   Specimen: BLOOD LEFT HAND  Result Value Ref Range Status   Specimen Description BLOOD LEFT HAND  Final   Special Requests   Final    BOTTLES DRAWN AEROBIC ONLY Blood Culture adequate volume   Culture   Final    NO GROWTH 5 DAYS Performed at [facility], [address] [place], [place] [address]    Report Status 03/01/2021 FINAL  Final          Radiology Studies: CT PELVIS W CONTRAST  Result Date: 02/27/2021 CLINICAL DATA:  Sepsis EXAM: CT PELVIS WITH CONTRAST TECHNIQUE: Multidetector CT imaging of the pelvis was performed using the standard protocol following the bolus administration of intravenous contrast. CONTRAST:  55mL OMNIPAQUE IOHEXOL 350 MG/ML SOLN COMPARISON:  11/25/2020, 12/30/2020 FINDINGS: Urinary Tract:  Distal ureters and bladder are grossly unremarkable. Bowel: No bowel obstruction or ileus.  Moderate fecal retention within the distal colon. No bowel wall thickening or inflammatory change.  Vascular/Lymphatic: Mild atherosclerosis.  No pathologic adenopathy. Reproductive:  No mass or other significant abnormality Other: There is no free intraperitoneal fluid or free gas. Interval development of moderate presacral edema of uncertain etiology. No abdominal wall hernia. Within the inferior margin of the left inguinal crease there is a focal area of subcutaneous fat stranding measuring 9 mm, which could reflect a small phlegmon. This is best seen on image 38/3. Musculoskeletal: There are no acute or destructive bony lesions. There is subcutaneous fat stranding overlying the sacrococcygeal junction without frank ulceration or tissue defect. No fluid collection or abscess. Reconstructed images demonstrate no additional findings. IMPRESSION: 1. Small 9 mm subcutaneous phlegmon or abscess within the left inguinal fold. 2. Subcutaneous fat stranding overlying the sacrococcygeal junction which could reflect developing changes of decubitus ulcer. No frank ulceration or soft tissue defect at this time. 3. Presacral edema, new since prior study, of uncertain etiology or significance. 4. Moderate fecal retention. 5.  Aortic Atherosclerosis (ICD10-I70.0). Electronically Signed   By: Sharlet Salina M.D.   On: 02/27/2021 16:37        Scheduled Meds:  atorvastatin  40 mg Per Tube QHS   docusate  100 mg Per Tube BID   heparin  5,000 Units Subcutaneous Q8H   insulin aspart  0-9 Units Subcutaneous Q4H   insulin detemir  10 Units Subcutaneous Daily   mouth rinse  15 mL Mouth Rinse BID   metoprolol tartrate  12.5 mg Per Tube BID   multivitamin with minerals  1 tablet Per Tube Daily   phosphorus  250 mg Per Tube BID   saccharomyces boulardii  250 mg Per Tube BID   sodium chloride flush  10-40 mL Intracatheter Q12H   Continuous Infusions:  cefTRIAXone (ROCEPHIN)  IV 2 g (03/01/21 1018)   metronidazole Stopped  (03/01/21 0744)     LOS: 6 days    Time spent: 35 minutes.     Alba Cory, MD [facility]   If 7PM-7AM, please contact night-coverage [email or web address]  03/01/2021, 3:12 PM

## 2021-03-01 NOTE — Progress Notes (Addendum)
Palliative Medicine Inpatient Follow Up Note  Consulting Provider: Margaretha Seeds, MD   Reason for consult:   [place] Palliative Medicine Consult  Reason for Consult? Seen by palliative as outpatient. Readmission for DKA. Brittle DKA    HPI:  Per intake H&P --> 60 year old with past medical history significant for diabetes, hypertension, nonischemic cardiomyopathy, pulmonary hypertension, CVA, history of cardiogenic shock, AKI, encephalopathy, cachexia, failure to thrive multiple admissions for DKA.  Presents to [facility] ED 10/17 with altered mental status and hyperglycemia. Patient was started on tube feeding on 10/19 due to poor oral intake.  Palliative care consulted for goals of care.   The [facility] Palliative care team has seen Christine Benitez multiple times from November 2021, June 2022, & July 2022. She is followed by OP Palliative support through [facility] though per  note review she has not been seen since August 2022.   Today's Discussion (03/01/2021):  *Please note that this is a verbal dictation therefore any spelling or grammatical errors are due to the "[place] [employer]" system interpretation.  Chart reviewed.    I met with Christine Benitez this morning and shared with her the conversation I had with her mother and daughter yesterday. We reviewed that she is not eating sufficiently which is why she has an NGT. I shared that per my conversation with Christine Benitez (Christine Benitez's mother) she does not believe that Christine Benitez would want a long term gastrostomy tube. Christine Benitez agrees with this and asks when we can "take the tube out her nose". She expresses that she would like to try to eat on her own.   I asked Christine Benitez what her wishes are right now. She expresses, "I want to go to rehabilitation and get up to walk out that door." I asked her what if we could not get to that point? She expresses that she would at least like to try.   I asked Christine Benitez her fears about hospice care. She shares that this is  mentioned to "try to kill her." I explained that is not at all the case and we reviewed Christine Benitez's declining health state over the past year. I shared in an effort to avoid additional suffering is why hospice was mentioned not to terminate anyone's life. I shared that many people live longer on hospice than without it. Christine Benitez remains to share that she would like to transition to a rehabilitation to get stronger.  ____________________________________________   Objective Assessment: Vital Signs Vitals:   03/01/21 0712 03/01/21 1021  BP: (!) 98/51 101/62  Pulse: 81 84  Resp: 12   Temp: 98.5 F (36.9 C)   SpO2: 99%     Intake/Output Summary (Last 24 hours) at 03/01/2021 1114 Last data filed at 03/01/2021 0442 Gross per 24 hour  Intake 1498.33 ml  Output 1450 ml  Net 48.33 ml    Last Weight  Most recent update: 03/01/2021  4:41 AM    Weight  40.8 kg (89 lb 15.2 oz)            Gen: Elderly female appears older than stated age HEENT: Dry mucous membranes, core track is in place CV: Regular rate and rhythm PULM: On room air ABD: soft/nontender EXT: No edema Neuro: Alert and oriented x2  SUMMARY OF RECOMMENDATIONS   DNAR/DNI   MOST and DNR form are on Vynca   Patient and her mother agree that a gastrostomy tube would be of not benefit presently   MSW - Appreciate support to identify the safest  placement option from hospitalization  This situation is complicated by Christine Benitez's lack of insurance at this time. I suspect she would most benefit from hospice care though given the patients consistent wishes she would like to do a trial at rehabilitation.   Given ongoing FTT and the lack of desire for long tube artificial nutrition I anticipate Christine Benitez will need hospice sooner than later. This can be assessed when she transitions as this change would be led by [facility] as she is being followed as an outpatient by them   Ongoing palliative support while inpatient  Time Spent: 35 Greater  than 50% of the time was spent in counseling and coordination of care ______________________________________________________________________________________ [place] Team Team Cell Phone: [phone] Please utilize secure chat with additional questions, if there is no response within 30 minutes please call the above phone number  Palliative Medicine Team providers are available by phone from 7am to 7pm daily and can be reached through the team cell phone.  Should this patient require assistance outside of these hours, please call the patient's attending physician.

## 2021-03-02 LAB — MAGNESIUM: Magnesium: 2.1 mg/dL (ref 1.7–2.4)

## 2021-03-02 LAB — BASIC METABOLIC PANEL
Anion gap: 8 (ref 5–15)
BUN: 10 mg/dL (ref 6–20)
CO2: 24 mmol/L (ref 22–32)
Calcium: 8.3 mg/dL — ABNORMAL LOW (ref 8.9–10.3)
Chloride: 102 mmol/L (ref 98–111)
Creatinine, Ser: 1 mg/dL (ref 0.44–1.00)
GFR, Estimated: >60 mL/min (ref >60)
Glucose, Bld: 207 mg/dL — ABNORMAL HIGH (ref 70–99)
Potassium: 5 mmol/L (ref 3.5–5.1)
Sodium: 134 mmol/L — ABNORMAL LOW (ref 135–145)

## 2021-03-02 LAB — CBC
HCT: 23.6 % — ABNORMAL LOW (ref 36.0–46.0)
Hemoglobin: 7.5 g/dL — ABNORMAL LOW (ref 12.0–15.0)
MCH: 31.9 pg (ref 26.0–34.0)
MCHC: 31.8 g/dL (ref 30.0–36.0)
MCV: 100.4 fL — ABNORMAL HIGH (ref 80.0–100.0)
Platelets: 299 10*3/uL (ref 150–400)
RBC: 2.35 MIL/uL — ABNORMAL LOW (ref 3.87–5.11)
RDW: 15.4 % (ref 11.5–15.5)
WBC: 6.5 10*3/uL (ref 4.0–10.5)
nRBC: 0.3 % — ABNORMAL HIGH (ref 0.0–0.2)

## 2021-03-02 LAB — GLUCOSE, CAPILLARY
Glucose-Capillary: 122 mg/dL — ABNORMAL HIGH (ref 70–99)
Glucose-Capillary: 176 mg/dL — ABNORMAL HIGH (ref 70–99)
Glucose-Capillary: 174 mg/dL — ABNORMAL HIGH (ref 70–99)
Glucose-Capillary: 251 mg/dL — ABNORMAL HIGH (ref 70–99)
Glucose-Capillary: 91 mg/dL (ref 70–99)
Glucose-Capillary: 96 mg/dL (ref 70–99)

## 2021-03-02 MED ORDER — SODIUM CHLORIDE 0.9 % IV BOLUS
500.0000 mL | Freq: Once | INTRAVENOUS | Status: AC
  Administered 2021-03-02: 500 mL via INTRAVENOUS

## 2021-03-02 MED ORDER — BACITRACIN-NEOMYCIN-POLYMYXIN OINTMENT TUBE
TOPICAL_OINTMENT | Freq: Two times a day (BID) | CUTANEOUS | Status: DC
  Administered 2021-03-02 – 2021-03-03 (×2): 1 via TOPICAL
  Filled 2021-03-02: qty 14

## 2021-03-02 MED ORDER — LACTATED RINGERS IV BOLUS
500.0000 mL | Freq: Once | INTRAVENOUS | Status: AC
  Administered 2021-03-02: 500 mL via INTRAVENOUS

## 2021-03-02 MED ORDER — SODIUM CHLORIDE 0.9 % IV SOLN: INTRAVENOUS | Status: DC

## 2021-03-02 NOTE — Progress Notes (Signed)
PROGRESS NOTE    Christine Benitez  WGN:[id number] DOB: 06-29-1960 DOA: 02/23/2021 PCP: Lupita Dawn, MD   Brief Narrative: 60 year old with past medical history significant for diabetes, hypertension, nonischemic cardiomyopathy, pulmonary hypertension, CVA, history of cardiogenic shock, AKI, encephalopathy, cachexia, failure to thrive multiple admissions for DKA.  Presents to [facility], ED 10/17 with altered mental status and hyperglycemia.  In the ED patient was altered, CO2 less than 7, glucose 486, lactic acid 3.7, beta hydroxybutyric acid more than 8.  She was given 2 L of LR and insulin infusion.  Patient was admitted by CCM.  Care transferred to [facility] 30/20/2022.  Family confirmed DNR status.  Patient has been followed by palliative care as an outpatient.  Patient was started on tube feeding on 10/19 due to poor oral intake.  Palliative care consulted for goals of care.   Assessment & Plan:   Active Problems:   DKA (diabetic ketoacidosis) (HCC)   Encephalopathy acute   Metabolic acidosis  1-DKA: Unclear if patient has been using insulin. Continue  Levemir 10 units daily Daily with a sliding scale insulin Ac 13.   2-Sepsis: patient presented with tachycardia, fevers, leukocytosis wbc 18./  1-3 Blood culture grew Micococcus species, CT pelvis: Small 9 mm subcutaneous phlegmon or abscess within the left inguinal fold.  This Discontinued to drain Chest x ray: Atelectasis at the bases. UA: Negative white blood cell Urine culture was not sent. Treated with Ceftriaxone and flagyl  for 6 days.  Abscess smaller, draining.   Anemia; probably anemia of acute illness and chronic disease B12: 737, iron 79, ferritin 423.  Folic acid 6.5 Follow trend. Blood transfusion for hb less than 7  Hypotension; hold metoprolol/   Acute metabolic encephalopathy: Status likely related to DKA and metabolic derangement.  Acute on chronic.   AKI: Resolved with IV fluid  Diastolic heart failure,  nonischemic cardiomyopathy: Holding aspirin and Plavix today due to drop in hemoglobin  Severe protein caloric malnutrition: See below She was a started on tube feedings in the ICU.   Palliative care consulted for goals of care.  Palliative care discussed with patient mother, no plan for G tube or tube feeding. Stop tube feeding.   History of CVA: holding aspirin/plavix due to anemia   Nutrition Problem: Severe Malnutrition Etiology: chronic illness (DM with frequent DKA, NICM, CHF)    Signs/Symptoms: severe muscle depletion, severe fat depletion, percent weight loss (16% weight loss within 3 months) Percent weight loss: 16 %    Interventions: Tube feeding, Ensure Enlive (each supplement provides 350kcal and 20 grams of protein), MVI, Magic cup  Estimated body mass index is 15.67 kg/m as calculated from the following:   Height as of this encounter: 5\' 5"  (1.651 m).   Weight as of this encounter: 42.7 kg.   DVT prophylaxis: Heparin Code Status: DNR Family Communication: Daughter over phone 10/20 Disposition Plan:  Status is: Inpatient  Remains inpatient appropriate because:  DKA hyperglycemia        Consultants:  CCM admitted patient.  Palliative care.   Procedures:    Antimicrobials:    Subjective: She is alert, asking to be fed.    Objective: Vitals:   03/02/21 1000 03/02/21 1044 03/02/21 1200 03/02/21 1305  BP: (!) 87/48 (!) 90/46  (!) 104/56  Pulse: 73 79 79 85  Resp: 18 19 18 13   Temp:      TempSrc:      SpO2: 100%   100%  Weight:  Height:        Intake/Output Summary (Last 24 hours) at 03/02/2021 1346 Last data filed at 03/02/2021 1040 Gross per 24 hour  Intake 300 ml  Output 1425 ml  Net -1125 ml    Filed Weights   02/28/21 0314 03/01/21 0440 03/02/21 0500  Weight: 41.2 kg 40.8 kg 42.7 kg    Examination:  General exam: Frail , cachetic Respiratory system: CTA Cardiovascular system: S 1, S 2 RRR Gastrointestinal system:  BS present, soft, nt Central nervous system: Alert Extremities no edema Pubic area; small open are left inguinal fold, express some  purulence. Area smaller.   Data Reviewed: I have personally reviewed following labs and imaging studies  CBC: Recent Labs  Lab 02/23/21 1547 02/23/21 2201 02/26/21 0419 02/27/21 0143 02/28/21 0226 03/01/21 0204 03/02/21 0138  WBC 19.1*   < > 5.3 5.0 6.0 6.6 6.5  NEUTROABS 16.3*  --   --   --   --   --   --   HGB 11.4*   < > 7.7* 7.5* 7.1* 7.4* 7.5*  HCT 39.4   < > 22.9* 23.2* 22.1* 22.7* 23.6*  MCV 105.6*   < > 93.9 95.1 96.5 97.0 100.4*  PLT 329   < > 144* 150 189 230 299   < > = values in this interval not displayed.    Basic Metabolic Panel: Recent Labs  Lab 02/26/21 0419 02/26/21 1600 02/27/21 0143 02/28/21 0226 03/01/21 0204 03/02/21 0835  NA 137 136  --  135 135 134*  K 3.6 4.6  --  4.7 4.9 5.0  CL 101 103  --  105 102 102  CO2 28 25  --  26 27 24   GLUCOSE 144* 223*  --  264* 146* 207*  BUN 13 14  --  14 11 10   CREATININE 0.73 0.78  --  0.84 0.77 1.00  CALCIUM 8.1* 8.3*  --  8.1* 8.4* 8.3*  MG 2.0 2.0 1.9 1.9 1.7 2.1  PHOS 2.8 1.7* 5.0* 2.6 2.9  --     GFR: Estimated Creatinine Clearance: 40.8 mL/min (by C-G formula based on SCr of 1 mg/dL). Liver Function Tests: Recent Labs  Lab 02/23/21 1547  AST 15  ALT 9  ALKPHOS 61  BILITOT 1.8*  PROT 7.3  ALBUMIN 3.7    No results for input(s): LIPASE, AMYLASE in the last 168 hours. No results for input(s): AMMONIA in the last 168 hours. Coagulation Profile: No results for input(s): INR, PROTIME in the last 168 hours. Cardiac Enzymes: No results for input(s): CKTOTAL, CKMB, CKMBINDEX, TROPONINI in the last 168 hours. BNP (last 3 results) No results for input(s): PROBNP in the last 8760 hours. HbA1C: No results for input(s): HGBA1C in the last 72 hours.  CBG: Recent Labs  Lab 03/01/21 2006 03/02/21 0108 03/02/21 0439 03/02/21 0822 03/02/21 1141  GLUCAP 80 122*  176* 174* 251*    Lipid Profile: No results for input(s): CHOL, HDL, LDLCALC, TRIG, CHOLHDL, LDLDIRECT in the last 72 hours. Thyroid Function Tests: No results for input(s): TSH, T4TOTAL, FREET4, T3FREE, THYROIDAB in the last 72 hours.  Anemia Panel: No results for input(s): VITAMINB12, FOLATE, FERRITIN, TIBC, IRON, RETICCTPCT in the last 72 hours.  Sepsis Labs: Recent Labs  Lab 02/23/21 1555 02/24/21 0700  LATICACIDVEN 3.7* 1.9     Recent Results (from the past 240 hour(s))  Blood culture (routine x 2)     Status: Abnormal   Collection Time: 02/23/21  5:28 PM  Specimen: BLOOD  Result Value Ref Range Status   Specimen Description BLOOD CENTRAL LINE  Final   Special Requests   Final    BOTTLES DRAWN AEROBIC AND ANAEROBIC Blood Culture adequate volume   Culture  Setup Time   Final    GRAM POSITIVE COCCI IN CLUSTERS AEROBIC BOTTLE ONLY CRITICAL RESULT CALLED TO, READ BACK BY AND VERIFIED WITH: PHARMD C AMEND [id number] FCP    Culture (A)  Final    MICROCOCCUS SPECIES Standardized susceptibility testing for this organism is not available. Performed at [facility], [address], [place], [place] [address]    Report Status 02/26/2021 FINAL  Final  Blood Culture ID Panel (Reflexed)     Status: None   Collection Time: 02/23/21  5:28 PM  Result Value Ref Range Status   Enterococcus faecalis NOT DETECTED NOT DETECTED Final   Enterococcus Faecium NOT DETECTED NOT DETECTED Final   Listeria monocytogenes NOT DETECTED NOT DETECTED Final   Staphylococcus species NOT DETECTED NOT DETECTED Final   Staphylococcus aureus (BCID) NOT DETECTED NOT DETECTED Final   Staphylococcus epidermidis NOT DETECTED NOT DETECTED Final   Staphylococcus lugdunensis NOT DETECTED NOT DETECTED Final   Streptococcus species NOT DETECTED NOT DETECTED Final   Streptococcus agalactiae NOT DETECTED NOT DETECTED Final   Streptococcus pneumoniae NOT DETECTED NOT DETECTED Final   Streptococcus  pyogenes NOT DETECTED NOT DETECTED Final   A.calcoaceticus-baumannii NOT DETECTED NOT DETECTED Final   Bacteroides fragilis NOT DETECTED NOT DETECTED Final   Enterobacterales NOT DETECTED NOT DETECTED Final   Enterobacter cloacae complex NOT DETECTED NOT DETECTED Final   Escherichia coli NOT DETECTED NOT DETECTED Final   Klebsiella aerogenes NOT DETECTED NOT DETECTED Final   Klebsiella oxytoca NOT DETECTED NOT DETECTED Final   Klebsiella pneumoniae NOT DETECTED NOT DETECTED Final   Proteus species NOT DETECTED NOT DETECTED Final   Salmonella species NOT DETECTED NOT DETECTED Final   Serratia marcescens NOT DETECTED NOT DETECTED Final   Haemophilus influenzae NOT DETECTED NOT DETECTED Final   Neisseria meningitidis NOT DETECTED NOT DETECTED Final   Pseudomonas aeruginosa NOT DETECTED NOT DETECTED Final   Stenotrophomonas maltophilia NOT DETECTED NOT DETECTED Final   Candida albicans NOT DETECTED NOT DETECTED Final   Candida auris NOT DETECTED NOT DETECTED Final   Candida glabrata NOT DETECTED NOT DETECTED Final   Candida krusei NOT DETECTED NOT DETECTED Final   Candida parapsilosis NOT DETECTED NOT DETECTED Final   Candida tropicalis NOT DETECTED NOT DETECTED Final   Cryptococcus neoformans/gattii NOT DETECTED NOT DETECTED Final    Comment: Performed at [facility], [address], [place], [place] [address]  Resp Panel by RT-PCR (Flu A&B, Covid) Nasopharyngeal Swab     Status: None   Collection Time: 02/23/21  8:24 PM   Specimen: Nasopharyngeal Swab; Nasopharyngeal(NP) swabs in vial transport medium  Result Value Ref Range Status   SARS Coronavirus 2 by RT PCR NEGATIVE NEGATIVE Final    Comment: (NOTE) SARS-CoV-2 target nucleic acids are NOT DETECTED.  The SARS-CoV-2 RNA is generally detectable in upper respiratory specimens during the acute phase of infection. The lowest concentration of SARS-CoV-2 viral copies this assay can detect is 138 copies/mL. A negative result does  not preclude SARS-Cov-2 infection and should not be used as the sole basis for treatment or other patient management decisions. A negative result may occur with  improper specimen collection/handling, submission of specimen other than nasopharyngeal swab, presence of viral mutation(s) within the areas  targeted by this assay, and inadequate number of viral copies(<138 copies/mL). A negative result must be combined with clinical observations, patient history, and epidemiological information. The expected result is Negative.  Fact Sheet for Patients:  BloggerCourse.com  Fact Sheet for Healthcare Providers:  SeriousBroker.it  This test is no t yet approved or cleared by the [place] FDA and  has been authorized for detection and/or diagnosis of SARS-CoV-2 by FDA under an Emergency Use Authorization (EUA). This EUA will remain  in effect (meaning this test can be used) for the duration of the COVID-19 declaration under Section 564(b)(1) of the Act, 21 U.S.C.section 360bbb-3(b)(1), unless the authorization is terminated  or revoked sooner.       Influenza A by PCR NEGATIVE NEGATIVE Final   Influenza B by PCR NEGATIVE NEGATIVE Final    Comment: (NOTE) The Xpert Xpress SARS-CoV-2/FLU/RSV plus assay is intended as an aid in the diagnosis of influenza from Nasopharyngeal swab specimens and should not be used as a sole basis for treatment. Nasal washings and aspirates are unacceptable for Xpert Xpress SARS-CoV-2/FLU/RSV testing.  Fact Sheet for Patients: BloggerCourse.com  Fact Sheet for Healthcare Providers: SeriousBroker.it  This test is not yet approved or cleared by the [place] FDA and has been authorized for detection and/or diagnosis of SARS-CoV-2 by FDA under an Emergency Use Authorization (EUA). This EUA will remain in effect (meaning this test can be used) for the  duration of the COVID-19 declaration under Section 564(b)(1) of the Act, 21 U.S.C. section 360bbb-3(b)(1), unless the authorization is terminated or revoked.  Performed at [facility], [address] [place], [place] [address]   MRSA Next Gen by PCR, Nasal     Status: None   Collection Time: 02/23/21 11:37 PM   Specimen: Nasal Mucosa; Nasal Swab  Result Value Ref Range Status   MRSA by PCR Next Gen NOT DETECTED NOT DETECTED Final    Comment: (NOTE) The GeneXpert MRSA Assay (FDA approved for NASAL specimens only), is one component of a comprehensive MRSA colonization surveillance program. It is not intended to diagnose MRSA infection nor to guide or monitor treatment for MRSA infections. Test performance is not FDA approved in patients less than 45 years old. Performed at [facility], [address] [place], [place] [address]   Blood culture (routine x 2)     Status: None   Collection Time: 02/24/21  2:04 AM   Specimen: BLOOD LEFT HAND  Result Value Ref Range Status   Specimen Description BLOOD LEFT HAND  Final   Special Requests   Final    BOTTLES DRAWN AEROBIC ONLY Blood Culture adequate volume   Culture   Final    NO GROWTH 5 DAYS Performed at [facility], [address], [place], [place] [address]    Report Status 03/01/2021 FINAL  Final          Radiology Studies: No results found.      Scheduled Meds:  atorvastatin  40 mg Oral QHS   docusate  100 mg Oral BID   heparin  5,000 Units Subcutaneous Q8H   insulin aspart  0-9 Units Subcutaneous Q4H   insulin detemir  10 Units Subcutaneous Daily   mouth rinse  15 mL Mouth Rinse BID   metoprolol tartrate  12.5 mg Oral BID   multivitamin with minerals  1 tablet Oral Daily   neomycin-bacitracin-polymyxin   Topical BID   phosphorus  250 mg Oral BID   saccharomyces  boulardii  250 mg Oral BID   sodium chloride flush  10-40 mL Intracatheter Q12H   Continuous Infusions:  metronidazole 500 mg  (03/02/21 0522)     LOS: 7 days    Time spent: 35 minutes.     Alba Cory, MD [facility]   If 7PM-7AM, please contact night-coverage [email or web address]  03/02/2021, 1:46 PM

## 2021-03-02 NOTE — TOC Progression Note (Signed)
Transition of Care [facility]) - Progression Note    Patient Details  Name: Christine Benitez MRN: [id number] Date of Birth: 01/10/1961  [facility] CM/SW Contact  Delilah Shan, LCSWA Phone Number: 03/02/2021, 3:48 PM  Clinical Narrative:      CSW spoke with patients daughter Christine Benitez who reports to CSW that patient comes from home with her. CSW and patients daughter discussed SNF recommendation. Patients daughter confirmed with CSW that she wants patient to return home when medically ready for dc. Christine Benitez reports to CSW that she is able to take care of her mother at home. Christine Benitez reports she recently applied for medicaid and disability for her mother. Medicaid is currently pending. CSW spoke with patients mother Christine Benitez and addressed concerns. CSW inquired about any conerns she has in the home. [name] expressed concern of patient getting necessary resources.Christine Benitez reports she checks on Christine Benitez and patient when she can. Christine Benitez reports she can continue to check on them and help when she can. All questions answered. No further questions at this time.My overall assessment is I do not feel like patients safety is compromised . CSW discussed with case Production designer, theatre/television/film. Case manager to look into charity home health with a social worker added so that patient and daughter can receive as many resources as possible to help assist the daughter and patient at home.                                              Social Determinants of Health (SDOH) Interventions    Readmission Risk Interventions Readmission Risk Prevention Plan 11/18/2020 11/06/2020  Transportation Screening Complete Complete  PCP or Specialist Appt within 5-7 Days - Not Complete  Not Complete comments - July 29  PCP or Specialist Appt within 3-5 Days Complete -  Home Care Screening - Complete  Medication Review (RN CM) - Complete  HRI or Home Care Consult Complete -  Social Work Consult for Recovery Care Planning/Counseling Complete -   Palliative Care Screening Complete -  Medication Review Oceanographer) Complete -  Some recent data might be hidden

## 2021-03-02 NOTE — Progress Notes (Signed)
Pt having low blood pressures with maps below 65. Pt does not seem to be symptomatic at this time. Paged attending, Dr. Sunnie Nielsen.

## 2021-03-03 LAB — GLUCOSE, CAPILLARY
Glucose-Capillary: 151 mg/dL — ABNORMAL HIGH (ref 70–99)
Glucose-Capillary: 171 mg/dL — ABNORMAL HIGH (ref 70–99)
Glucose-Capillary: 171 mg/dL — ABNORMAL HIGH (ref 70–99)
Glucose-Capillary: 149 mg/dL — ABNORMAL HIGH (ref 70–99)
Glucose-Capillary: 122 mg/dL — ABNORMAL HIGH (ref 70–99)
Glucose-Capillary: 85 mg/dL (ref 70–99)

## 2021-03-03 LAB — BASIC METABOLIC PANEL
Anion gap: 6 (ref 5–15)
Anion gap: 5 (ref 5–15)
Anion gap: 6 (ref 5–15)
BUN: 9 mg/dL (ref 6–20)
BUN: 8 mg/dL (ref 6–20)
BUN: 7 mg/dL (ref 6–20)
CO2: 21 mmol/L — ABNORMAL LOW (ref 22–32)
CO2: 21 mmol/L — ABNORMAL LOW (ref 22–32)
CO2: 23 mmol/L (ref 22–32)
Calcium: 8.4 mg/dL — ABNORMAL LOW (ref 8.9–10.3)
Calcium: 8.4 mg/dL — ABNORMAL LOW (ref 8.9–10.3)
Calcium: 8.9 mg/dL (ref 8.9–10.3)
Chloride: 107 mmol/L (ref 98–111)
Chloride: 107 mmol/L (ref 98–111)
Chloride: 107 mmol/L (ref 98–111)
Creatinine, Ser: 0.83 mg/dL (ref 0.44–1.00)
Creatinine, Ser: 0.9 mg/dL (ref 0.44–1.00)
Creatinine, Ser: 0.82 mg/dL (ref 0.44–1.00)
GFR, Estimated: >60 mL/min (ref >60)
GFR, Estimated: >60 mL/min (ref >60)
GFR, Estimated: >60 mL/min (ref >60)
Glucose, Bld: 174 mg/dL — ABNORMAL HIGH (ref 70–99)
Glucose, Bld: 245 mg/dL — ABNORMAL HIGH (ref 70–99)
Glucose, Bld: 76 mg/dL (ref 70–99)
Potassium: 5.5 mmol/L — ABNORMAL HIGH (ref 3.5–5.1)
Potassium: 5.4 mmol/L — ABNORMAL HIGH (ref 3.5–5.1)
Potassium: 4.8 mmol/L (ref 3.5–5.1)
Sodium: 134 mmol/L — ABNORMAL LOW (ref 135–145)
Sodium: 133 mmol/L — ABNORMAL LOW (ref 135–145)
Sodium: 136 mmol/L (ref 135–145)

## 2021-03-03 LAB — CBC
HCT: 21.8 % — ABNORMAL LOW (ref 36.0–46.0)
Hemoglobin: 6.8 g/dL — CL (ref 12.0–15.0)
MCH: 32.1 pg (ref 26.0–34.0)
MCHC: 31.2 g/dL (ref 30.0–36.0)
MCV: 102.8 fL — ABNORMAL HIGH (ref 80.0–100.0)
Platelets: 344 10*3/uL (ref 150–400)
RBC: 2.12 MIL/uL — ABNORMAL LOW (ref 3.87–5.11)
RDW: 16.3 % — ABNORMAL HIGH (ref 11.5–15.5)
WBC: 5.7 10*3/uL (ref 4.0–10.5)
nRBC: 0 % (ref 0.0–0.2)

## 2021-03-03 LAB — HEMOGLOBIN AND HEMATOCRIT, BLOOD
HCT: 32.9 % — ABNORMAL LOW (ref 36.0–46.0)
Hemoglobin: 11 g/dL — ABNORMAL LOW (ref 12.0–15.0)

## 2021-03-03 LAB — PREPARE RBC (CROSSMATCH)

## 2021-03-03 LAB — MAGNESIUM: Magnesium: 1.8 mg/dL (ref 1.7–2.4)

## 2021-03-03 LAB — ABO/RH: ABO/RH(D): AB POS

## 2021-03-03 MED ORDER — CEFTRIAXONE SODIUM 2 G IJ SOLR
2.0000 g | INTRAMUSCULAR | Status: AC
Start: 2021-03-03 — End: 2021-03-04
  Administered 2021-03-03: 2 g via INTRAVENOUS
  Filled 2021-03-03: qty 20

## 2021-03-03 MED ORDER — SODIUM CHLORIDE 0.9% IV SOLUTION: Freq: Once | INTRAVENOUS | Status: AC

## 2021-03-03 MED ORDER — SODIUM ZIRCONIUM CYCLOSILICATE 10 G PO PACK
10.0000 g | PACK | Freq: Once | ORAL | Status: AC
  Administered 2021-03-03: 10 g via ORAL
  Filled 2021-03-03: qty 1

## 2021-03-03 MED ORDER — SODIUM ZIRCONIUM CYCLOSILICATE 10 G PO PACK: 10.0000 g | PACK | Freq: Once | ORAL | Status: DC

## 2021-03-03 NOTE — Progress Notes (Signed)
Physical Therapy Treatment Patient Details Name: TARANEH METHENEY MRN: [id number] DOB: 04-Nov-1960 Today's Date: 03/03/2021   History of Present Illness Pt is a 60 y.o. female who presented 02/23/21 with AMS and hyperglycemia. Admitted with DKA, acute encephalopathy, possible sepsis, and metabolic acidosis. PMH: diabetes, hypertension, nonischemic cardiomyopathy, pulmonary hypertension, CVA, history of cardiogenic shock, AKI, encephalopathy, cachexia, failure to thrive multiple admissions    PT Comments    Patient with much improvement with mobility this session getting up to EOB with min A and able to stand briefly and self support sitting EOB to eat most of a cup of pudding.  She was eager to eat dinner but wanted back in bed so RN in the room to position for when dinner tray arrived.  She was getting her first of two units of blood and BP stable and reported no dizziness.  PT will continue to follow acutely.  Feel she can go home with family support, HHPT/OT/RN/aide and not sure what equipment already in the home, but listed below if needed.     Recommendations for follow up therapy are one component of a multi-disciplinary discharge planning process, led by the attending physician.  Recommendations may be updated based on patient status, additional functional criteria and insurance authorization.  Follow Up Recommendations  Home health PT     Assistance Recommended at Discharge Frequent or constant Supervision/Assistance  Equipment Recommendations  Wheelchair (measurements PT);Hospital bed;3in1 (PT) (unsure what equipment she already has at home)    Recommendations for Other Services       Precautions / Restrictions Precautions Precautions: Fall     Mobility  Bed Mobility Overal bed mobility: Needs Assistance Bed Mobility: Supine to Sit;Sit to Supine     Supine to sit: Min assist Sit to supine: Mod assist   General bed mobility comments: patient initiating with increased time  and cues, then assist to lift trunk, patient to supine assist for legs after pt initiated    Transfers Overall transfer level: Needs assistance Equipment used: Rolling walker (2 wheels) Transfers: Sit to/from Stand;Lateral/Scoot Transfers Sit to Stand: Mod assist          Lateral/Scoot Transfers: Mod assist General transfer comment: up to stand to RW with some lifting help, scooting up to [facility] with scoot pivots mod A for lifting and increased time after cues    Ambulation/Gait                 Stairs             Wheelchair Mobility    Modified Rankin (Stroke Patients Only)       Balance Overall balance assessment: Needs assistance   Sitting balance-Leahy Scale: Fair Sitting balance - Comments: sitting EOB with S using UE's for self feeding pudding without LOB   Standing balance support: Bilateral upper extremity supported Standing balance-Leahy Scale: Poor Standing balance comment: UE support for balance and min A not tolerated long, about 15 seconds, then requesting to return to supine                            Cognition Arousal/Alertness: Awake/alert Behavior During Therapy: Flat affect Overall Cognitive Status: No family/caregiver present to determine baseline cognitive functioning                                 General Comments: follows repeated one step commands with increased  time, able to relate some about her pain and excited with her ability to stand today, almost childlike        Exercises      General Comments General comments (skin integrity, edema, etc.): denies dizziness at EOB and sitting BP 117/68; recieving first of 2 units of blood      Pertinent Vitals/Pain Pain Assessment: Faces Faces Pain Scale: Hurts little more Pain Location: ganeralized Pain Descriptors / Indicators: Aching Pain Intervention(s): Monitored during session;Premedicated before session;Repositioned    Home Living                           Prior Function            PT Goals (current goals can now be found in the care plan section) Progress towards PT goals: Progressing toward goals    Frequency    Min 3X/week      PT Plan Discharge plan needs to be updated;Frequency needs to be updated    Co-evaluation              AM-PAC PT "6 Clicks" Mobility   Outcome Measure  Help needed turning from your back to your side while in a flat bed without using bedrails?: A Little Help needed moving from lying on your back to sitting on the side of a flat bed without using bedrails?: A Little Help needed moving to and from a bed to a chair (including a wheelchair)?: A Lot Help needed standing up from a chair using your arms (e.g., wheelchair or bedside chair)?: A Lot Help needed to walk in hospital room?: Total Help needed climbing 3-5 steps with a railing? : Total 6 Click Score: 12    End of Session   Activity Tolerance: Patient limited by fatigue Patient left: in bed;with call bell/phone within reach;with bed alarm set;with nursing/sitter in room   PT Visit Diagnosis: Muscle weakness (generalized) (M62.81);Other symptoms and signs involving the nervous system (R29.898);Other abnormalities of gait and mobility (R26.89)     Time: 8295-6213 PT Time Calculation (min) (ACUTE ONLY): 17 min  Charges:  $Therapeutic Activity: 8-22 mins                     Sheran Lawless, PT Acute Rehabilitation Services Pager:[phone] Office:[phone] 03/03/2021    Elray Mcgregor 03/03/2021, 5:28 PM

## 2021-03-03 NOTE — Progress Notes (Signed)
Palliative Medicine Inpatient Follow Up Note  Consulting Provider: Margaretha Seeds, MD   Reason for consult:   Christine Benitez Palliative Medicine Consult  Reason for Consult? Seen by palliative as outpatient. Readmission for DKA. Brittle DKA    HPI:  Per intake H&P --> 60 year old with past medical history significant for diabetes, hypertension, nonischemic cardiomyopathy, pulmonary hypertension, CVA, history of cardiogenic shock, AKI, encephalopathy, cachexia, failure to thrive multiple admissions for DKA.  [facility] 10/17 with altered mental status and hyperglycemia. Patient was started on tube feeding on 10/19 due to poor oral intake.  Palliative care consulted for goals of care.   The [facility] Palliative care team has seen Christine Benitez multiple times from November 2021, June 2022, & July 2022. She is followed by OP Palliative support through [facility] though per  note review she has not been seen since August 2022.   Today's Discussion (03/03/2021):  *Please note that this is a verbal dictation therefore any spelling or grammatical errors are due to the "[place]" system interpretation.  Chart reviewed.    I met with Christine Benitez this morning she was alert and in fair spirits. She endorses no complaints this morning and continues to be eating as well as she can though this appears to be fairly minimal. She concedes to not wanting a feeding tube. She continues to hope for improvements.   Per secure chat with the MSW team they will try to get temporary funding for services PT?OT until insurance goes through.   I called patients mother, Christine Benitez who did not answer the call. I will try again later.    Objective Assessment: Vital Signs Vitals:   03/03/21 0345 03/03/21 1336  BP: (!) 105/57 117/70  Pulse: 69 77  Resp: 20 19  Temp: 99.2 F (37.3 C) 98.8 F (37.1 C)  SpO2: 100% 100%    Intake/Output Summary (Last 24 hours) at 03/03/2021 1516 Last data  filed at 03/02/2021 1813 Gross per 24 hour  Intake 1517.73 ml  Output 850 ml  Net 667.73 ml    Last Weight  Most recent update: 03/02/2021  5:24 AM    Weight  42.7 kg (94 lb 2.2 oz)            Gen: Elderly female appears older than stated age HEENT: Dry mucous membranes, core track is in place CV: Regular rate and rhythm PULM: On room air ABD: soft/nontender EXT: No edema Neuro: Alert and oriented x2  SUMMARY OF RECOMMENDATIONS   DNAR/DNI   MOST and DNR form are on Vynca   Patient and her mother agree that a gastrostomy tube would be of not benefit presently  This situation is complicated by Christine Benitez's lack of insurance at this time. I suspect she would most benefit from hospice care though given the patients consistent wishes she would like to do a trial of rehabilitation. For now the [facility] will try to provide a "LOG" for 21 days of service with [facility]. I highly suspect she will not advance far with services and will maintain ongoing FTT whereby hospice will need to be involved sooner than later. She prior had been a member of the [facility] palliative services which will need to continue.    Ongoing palliative support while inpatient  Time Spent: 25 Greater than 50% of the time was spent in counseling and coordination of care ______________________________________________________________________________________ [place] Team Team Cell Phone: [phone] Please utilize secure chat with additional questions,  if there is no response within 30 minutes please call the above phone number  Palliative Medicine Team providers are available by phone from 7am to 7pm daily and can be reached through the team cell phone.  Should this patient require assistance outside of these hours, please call the patient's attending physician.

## 2021-03-03 NOTE — Progress Notes (Signed)
[facility] ([facility])  Hospital Liaison: RN note         This patient has been referred to our palliative care services in the community.  [facility] will continue to follow for any discharge planning needs and to coordinate continuation of palliative care in the outpatient setting.    If you have questions or need assistance, please call [phone] or contact the hospital Liaison listed on AMION.      Thank you for this referral.         Mary Anne Robertson, RN, CCM  [facility] Hospital Liaison   [phone] 

## 2021-03-03 NOTE — TOC Progression Note (Addendum)
Transition of Care [facility]) - Progression Note    Patient Details  Name: Christine Benitez MRN: [id number] Date of Birth: 1960/09/29  Transition of Care [facility]) CM/SW Contact  Delilah Shan, LCSWA Phone Number: 03/03/2021, 4:42 PM  Clinical Narrative:     CSW spoke with Latori patients daughter and requested information about when medicaid started to help with possible Log for patient. Patients daughter confirmed she will follow up with her tomorrow on additional information. Patients daughter gave CSW permission to reach out to financial counseling to assist with medicaid questions needed to help assist with possible Log for patient. CSW will continue to follow and assist with patients dc planning needs.  Expected Discharge Plan: Home w Home Health Services Barriers to Discharge: Continued Medical Work up  Expected Discharge Plan and Services Expected Discharge Plan: Home w Home Health Services In-house Referral: NA Discharge Planning Services: CM Consult Post Acute Care Choice: Home Health Living arrangements for the past 2 months: Single Family Home                   DME Agency: NA       HH Arranged: Disease Management, RN, PT, Social Work HH Agency: [facility] (Letter of Guarantee) Date HH Agency Contacted: 03/03/21 Time HH Agency Contacted: 1637 Representative spoke with at [facility] Agency: Kandee Keen   Social Determinants of Health (SDOH) Interventions    Readmission Risk Interventions Readmission Risk Prevention Plan 11/18/2020 11/06/2020  Transportation Screening Complete Complete  PCP or Specialist Appt within 5-7 Days - Not Complete  Not Complete comments - July 29  PCP or Specialist Appt within 3-5 Days Complete -  Home Care Screening - Complete  Medication Review (RN CM) - Complete  HRI or Home Care Consult Complete -  Social Work Consult for Recovery Care Planning/Counseling Complete -  Palliative Care Screening Complete -  Medication Review Oceanographer)  Complete -  Some recent data might be hidden

## 2021-03-03 NOTE — TOC Initial Note (Signed)
Transition of Care [facility]) - Initial/Assessment Note    Patient Details  Name: Christine Benitez MRN: [id number] Date of Birth: 03/28/1961  Transition of Care [facility]) CM/SW Contact:    Gala Lewandowsky, RN Phone Number: 03/03/2021, 4:38 PM  Clinical Narrative:   Patient is unable to use charity care due to she lives in [place]. Case Manager reached out to Lead supervisor Jiles Crocker and she approved a letter of guarantee for Registered Nurse for medication and disease management, Physical Therapy for evaluation and treatment, and social worker for community resources and assistance with Medicaid application that has been initiated-orders will need to be written with [employer]. Frances Furbish is willing to accept the letter of guarantee. Awaiting to hear who the Medicaid Worker is in [place] to apply to log before the log is processed. Start of care to begin within 24-48 hours post transition home. Patient has a rolling walker in the home. Outpatient palliative consult sent to [facility] collective. Case Manager will continue to follow for additional needs.           Expected Discharge Plan: Home w Home Health Services Barriers to Discharge: Continued Medical Work up   Patient Goals and CMS Choice Patient states their goals for this hospitalization and ongoing recovery are:: to return home   Choice offered to / list presented to :  (Letter of Guarantee initiated.)  Expected Discharge Plan and Services Expected Discharge Plan: Home w Home Health Services In-house Referral: NA Discharge Planning Services: CM Consult Post Acute Care Choice: Home Health Living arrangements for the past 2 months: Single Family Home                   DME Agency: NA       HH Arranged: Disease Management, RN, PT, Social Work HH Agency: [facility] (Letter of Guarantee) Date HH Agency Contacted: 03/03/21 Time HH Agency Contacted: 1637 Representative spoke with at [facility] Agency:  Kandee Keen  Prior Living Arrangements/Services Living arrangements for the past 2 months: Single Family Home Lives with:: Adult Children Patient language and need for interpreter reviewed:: Yes Do you feel safe going back to the place where you live?: Yes      Need for Family Participation in Patient Care: Yes (Comment) Care giver support system in place?: Yes (comment) Current home services:  (Patient has a rolling walker at home) Criminal Activity/Legal Involvement Pertinent to Current Situation/Hospitalization: No - Comment as needed  Activities of Daily Living   ADL Screening (condition at time of admission) Is the patient deaf or have difficulty hearing?: No Does the patient have difficulty seeing, even when wearing glasses/contacts?: No Does the patient have difficulty concentrating, remembering, or making decisions?: Yes Does the patient have difficulty dressing or bathing?: Yes Does the patient have difficulty walking or climbing stairs?: Yes  Permission Sought/Granted Permission sought to share information with : Family Supports, Magazine features editor, Case Estate manager/land agent granted to share information with : Yes, Verbal Permission Granted     Permission granted to share info w AGENCY: [facility]        Emotional Assessment Appearance:: Appears stated age Attitude/Demeanor/Rapport: Engaged Affect (typically observed): Appropriate Orientation: : Oriented to Situation, Oriented to  Time, Oriented to Place, Oriented to Self Alcohol / Substance Use: Not Applicable Psych Involvement: No (comment)  Admission diagnosis:  DKA (diabetic ketoacidosis) (HCC) [E11.10] Unresponsive [R41.89] Central line insertion site infection [T80.212A] Patient Active Problem List   Diagnosis Date Noted   Encephalopathy  acute    Metabolic acidosis    Nausea & vomiting 12/26/2020   Dehydration 12/26/2020   Chronic diastolic CHF (congestive heart failure) (HCC) 12/26/2020   DKA (diabetic  ketoacidosis) (HCC) 12/25/2020   Severe sepsis (HCC) 11/25/2020   Sepsis due to pneumonia (HCC)    Hyperkalemia 11/11/2020   Constipation 11/03/2020   Cachexia (HCC) 11/03/2020   Malnutrition of moderate degree 11/03/2020   Hypoglycemia 11/02/2020   Proteus mirabilis infection 10/25/2020   Acute lower UTI 10/24/2020   Urinary tract infection 10/24/2020   Failure to thrive in adult 10/24/2020   Pericardial effusion    Iron deficiency anemia    Chronic systolic CHF (congestive heart failure) (HCC)    Heart failure with reduced ejection fraction (HCC) 07/18/2020   History of CVA (cerebrovascular accident) 07/18/2020   Nonischemic cardiomyopathy (HCC) 07/18/2020   Type 2 diabetes mellitus with complication, with long-term current use of insulin (HCC) 07/18/2020   Tobacco use 07/18/2020   Mixed hyperlipidemia 07/18/2020   Abnormal echocardiogram 07/18/2020   Resides in long term care facility 06/20/2020   Protein-calorie malnutrition, severe 03/24/2020   Pain    Altered mental status    DNR (do not resuscitate)    DNI (do not intubate)    Encounter for hospice care discussion    Goals of care, counseling/discussion    Palliative care by specialist    AKI (acute kidney injury) (HCC)    Cardiogenic shock (HCC) 03/14/2020   Acute urinary retention 03/13/2020   Chronic cholecystitis with calculus 03/12/2020   Cerebral thrombosis with cerebral infarction 03/12/2020   Abnormal LFTs    Hypertension    NICM (nonischemic cardiomyopathy) (HCC)    Pulmonary hypertension (HCC)    Tobacco abuse 06/10/2014   Diabetes mellitus (HCC) 06/10/2014   Family history of premature CAD 06/10/2014   History of blood transfusion 1986   PCP:  Lupita Dawn, MD Pharmacy:   [phone] [place], [place] - [address] AT [facility] [address] [place] [address] Phone: [phone] Fax: [phone]  [facility] [address] [place] [address] Phone: [phone] Fax: [phone]     Social Determinants of Health (SDOH) Interventions    Readmission Risk Interventions Readmission Risk Prevention Plan 11/18/2020 11/06/2020  Transportation Screening Complete Complete  PCP or Specialist Appt within 5-7 Days - Not Complete  Not Complete comments - July 29  PCP or Specialist Appt within 3-5 Days Complete -  Home Care Screening - Complete  Medication Review (RN CM) - Complete  HRI or Home Care Consult Complete -  Social Work Consult for Recovery Care Planning/Counseling Complete -  Palliative Care Screening Complete -  Medication Review Oceanographer) Complete -  Some recent data might be hidden

## 2021-03-03 NOTE — Progress Notes (Signed)
PROGRESS NOTE    ARIE GABLE  XKG:[id number] DOB: 09-21-1960 DOA: 02/23/2021 PCP: Lupita Dawn, MD   Brief Narrative: 60 year old with past medical history significant for diabetes, hypertension, nonischemic cardiomyopathy, pulmonary hypertension, CVA, history of cardiogenic shock, AKI, encephalopathy, cachexia, failure to thrive multiple admissions for DKA.  Presents to [facility], ED 10/17 with altered mental status and hyperglycemia.  In the ED patient was altered, CO2 less than 7, glucose 486, lactic acid 3.7, beta hydroxybutyric acid more than 8.  She was given 2 L of LR and insulin infusion.  Patient was admitted by CCM.  Care transferred to triad 30/20/2022.  Family confirmed DNR status.  Patient has been followed by palliative care as an outpatient.  Patient was started on tube feeding on 10/19 due to poor oral intake.  Palliative care consulted for goals of care. Tube feeding discontinue after goals of care discussion with patient's Mother.   Patient also diagnosed with sepsis secondary to small abscess in the left inguinal fold, 1 blood culture grew micrococcus.  She received 7 days of IV antibiotics.  Abscess has been draining on its own.  Almost resolved.  Developed diarrhea secondary to tube feedings, diarrhea has resolved.  Patient hemoglobin decreased to 6.8, no evidence of acute bleed, plan to proceed with 2 units of packed red blood cell transfusion.   Assessment & Plan:   Active Problems:   DKA (diabetic ketoacidosis) (HCC)   Encephalopathy acute   Metabolic acidosis  1-DKA: Unclear if patient has been using insulin. Continue  Levemir 10 units daily Daily with a sliding scale insulin Ac 13.   2-Sepsis: patient presented with tachycardia, fevers, leukocytosis wbc 18./  1-3 Blood culture grew Micococcus species, CT pelvis: Small 9 mm subcutaneous phlegmon or abscess within the left inguinal fold.  This Discontinued to drain Chest x ray: Atelectasis at the bases. UA:  Negative white blood cell Urine culture was not sent. Treated with Ceftriaxone and flagyl  for 7 days.  Abscess smaller, draining.   Anemia; probably anemia of acute illness and chronic disease B12: 737, iron 79, ferritin 423.  Folic acid 6.5 No evidence of acute blood loss Hb down to 6.9 will proceed with 2 units PRBC  Hypotension; hold metoprolol/   Acute metabolic encephalopathy: Status likely related to DKA and metabolic derangement.  Acute on chronic.   AKI: Resolved with IV fluid Hyperkalemia;  Will give a dose of lokelma.   Diastolic heart failure, nonischemic cardiomyopathy: Holding aspirin and Plavix today due to drop in hemoglobin  Severe protein caloric malnutrition: See below She was a started on tube feedings in the ICU.   Palliative care consulted for goals of care.  Palliative care discussed with patient mother, no plan for G tube or tube feeding. Stop tube feeding.   History of CVA: holding aspirin/plavix due to anemia   Nutrition Problem: Severe Malnutrition Etiology: chronic illness (DM with frequent DKA, NICM, CHF)    Signs/Symptoms: severe muscle depletion, severe fat depletion, percent weight loss (16% weight loss within 3 months) Percent weight loss: 16 %    Interventions: Tube feeding, Ensure Enlive (each supplement provides 350kcal and 20 grams of protein), MVI, Magic cup  Estimated body mass index is 15.67 kg/m as calculated from the following:   Height as of this encounter: 5\' 5"  (1.651 m).   Weight as of this encounter: 42.7 kg.   DVT prophylaxis: Heparin Code Status: DNR Family Communication: Daughter over phone 10/20 Disposition Plan:  Status is: Inpatient  Remains inpatient appropriate because:  anemia, getting blood transfusion today. Lokelma for hyperkalemia. Plan to discharge home with [facility] and palliative care 10/26        Consultants:  CCM admitted patient.  Palliative care.   Procedures:    Antimicrobials:     Subjective: She is alert, denies pain. Likely chronic cognitive deficit.   Objective: Vitals:   03/02/21 1805 03/02/21 2010 03/03/21 0345 03/03/21 1336  BP: 116/67 (!) 96/57 (!) 105/57 117/70  Pulse:  68 69 77  Resp: (!) 21 17 20 19   Temp:  99.5 F (37.5 C) 99.2 F (37.3 C) 98.8 F (37.1 C)  TempSrc:  Oral Oral Oral  SpO2:  98% 100% 100%  Weight:      Height:        Intake/Output Summary (Last 24 hours) at 03/03/2021 1505 Last data filed at 03/02/2021 1813 Gross per 24 hour  Intake 1517.73 ml  Output 850 ml  Net 667.73 ml    Filed Weights   02/28/21 0314 03/01/21 0440 03/02/21 0500  Weight: 41.2 kg 40.8 kg 42.7 kg    Examination:  General exam: Frail , cachetic Respiratory system: CTA Cardiovascular system: S 1, S 2 RRR Gastrointestinal system: BS present, soft, nt Central nervous system: Alert Extremities no edema Pubic area; small open are left inguinal fold, express some  purulence. Area smaller.   Data Reviewed: I have personally reviewed following labs and imaging studies  CBC: Recent Labs  Lab 02/27/21 0143 02/28/21 0226 03/01/21 0204 03/02/21 0138 03/03/21 0818  WBC 5.0 6.0 6.6 6.5 5.7  HGB 7.5* 7.1* 7.4* 7.5* 6.8*  HCT 23.2* 22.1* 22.7* 23.6* 21.8*  MCV 95.1 96.5 97.0 100.4* 102.8*  PLT 150 189 230 299 344    Basic Metabolic Panel: Recent Labs  Lab 02/26/21 0419 02/26/21 1600 02/27/21 0143 02/28/21 0226 03/01/21 0204 03/02/21 0835 03/03/21 0818 03/03/21 1051  NA 137 136  --  135 135 134* 134* 133*  K 3.6 4.6  --  4.7 4.9 5.0 5.5* 5.4*  CL 101 103  --  105 102 102 107 107  CO2 28 25  --  26 27 24  21* 21*  GLUCOSE 144* 223*  --  264* 146* 207* 174* 245*  BUN 13 14  --  14 11 10 9 8   CREATININE 0.73 0.78  --  0.84 0.77 1.00 0.83 0.90  CALCIUM 8.1* 8.3*  --  8.1* 8.4* 8.3* 8.4* 8.4*  MG 2.0 2.0 1.9 1.9 1.7 2.1 1.8  --   PHOS 2.8 1.7* 5.0* 2.6 2.9  --   --   --     GFR: Estimated Creatinine Clearance: 45.4 mL/min (by C-G  formula based on SCr of 0.9 mg/dL). Liver Function Tests: No results for input(s): AST, ALT, ALKPHOS, BILITOT, PROT, ALBUMIN in the last 168 hours.  No results for input(s): LIPASE, AMYLASE in the last 168 hours. No results for input(s): AMMONIA in the last 168 hours. Coagulation Profile: No results for input(s): INR, PROTIME in the last 168 hours. Cardiac Enzymes: No results for input(s): CKTOTAL, CKMB, CKMBINDEX, TROPONINI in the last 168 hours. BNP (last 3 results) No results for input(s): PROBNP in the last 8760 hours. HbA1C: No results for input(s): HGBA1C in the last 72 hours.  CBG: Recent Labs  Lab 03/02/21 2020 03/03/21 0401 03/03/21 0812 03/03/21 1226 03/03/21 1314  GLUCAP 96 151* 171* 171* 149*    Lipid Profile: No results for input(s): CHOL, HDL, LDLCALC, TRIG, CHOLHDL, LDLDIRECT in the  last 72 hours. Thyroid Function Tests: No results for input(s): TSH, T4TOTAL, FREET4, T3FREE, THYROIDAB in the last 72 hours.  Anemia Panel: No results for input(s): VITAMINB12, FOLATE, FERRITIN, TIBC, IRON, RETICCTPCT in the last 72 hours.  Sepsis Labs: No results for input(s): PROCALCITON, LATICACIDVEN in the last 168 hours.   Recent Results (from the past 240 hour(s))  Blood culture (routine x 2)     Status: Abnormal   Collection Time: 02/23/21  5:28 PM   Specimen: BLOOD  Result Value Ref Range Status   Specimen Description BLOOD CENTRAL LINE  Final   Special Requests   Final    BOTTLES DRAWN AEROBIC AND ANAEROBIC Blood Culture adequate volume   Culture  Setup Time   Final    GRAM POSITIVE COCCI IN CLUSTERS AEROBIC BOTTLE ONLY CRITICAL RESULT CALLED TO, READ BACK BY AND VERIFIED WITH: PHARMD C AMEND [id number] FCP    Culture (A)  Final    MICROCOCCUS SPECIES Standardized susceptibility testing for this organism is not available. Performed at [facility], [address] [place], [place] [address]    Report Status 02/26/2021 FINAL  Final  Blood Culture ID  Panel (Reflexed)     Status: None   Collection Time: 02/23/21  5:28 PM  Result Value Ref Range Status   Enterococcus faecalis NOT DETECTED NOT DETECTED Final   Enterococcus Faecium NOT DETECTED NOT DETECTED Final   Listeria monocytogenes NOT DETECTED NOT DETECTED Final   Staphylococcus species NOT DETECTED NOT DETECTED Final   Staphylococcus aureus (BCID) NOT DETECTED NOT DETECTED Final   Staphylococcus epidermidis NOT DETECTED NOT DETECTED Final   Staphylococcus lugdunensis NOT DETECTED NOT DETECTED Final   Streptococcus species NOT DETECTED NOT DETECTED Final   Streptococcus agalactiae NOT DETECTED NOT DETECTED Final   Streptococcus pneumoniae NOT DETECTED NOT DETECTED Final   Streptococcus pyogenes NOT DETECTED NOT DETECTED Final   A.calcoaceticus-baumannii NOT DETECTED NOT DETECTED Final   Bacteroides fragilis NOT DETECTED NOT DETECTED Final   Enterobacterales NOT DETECTED NOT DETECTED Final   Enterobacter cloacae complex NOT DETECTED NOT DETECTED Final   Escherichia coli NOT DETECTED NOT DETECTED Final   Klebsiella aerogenes NOT DETECTED NOT DETECTED Final   Klebsiella oxytoca NOT DETECTED NOT DETECTED Final   Klebsiella pneumoniae NOT DETECTED NOT DETECTED Final   Proteus species NOT DETECTED NOT DETECTED Final   Salmonella species NOT DETECTED NOT DETECTED Final   Serratia marcescens NOT DETECTED NOT DETECTED Final   Haemophilus influenzae NOT DETECTED NOT DETECTED Final   Neisseria meningitidis NOT DETECTED NOT DETECTED Final   Pseudomonas aeruginosa NOT DETECTED NOT DETECTED Final   Stenotrophomonas maltophilia NOT DETECTED NOT DETECTED Final   Candida albicans NOT DETECTED NOT DETECTED Final   Candida auris NOT DETECTED NOT DETECTED Final   Candida glabrata NOT DETECTED NOT DETECTED Final   Candida krusei NOT DETECTED NOT DETECTED Final   Candida parapsilosis NOT DETECTED NOT DETECTED Final   Candida tropicalis NOT DETECTED NOT DETECTED Final   Cryptococcus  neoformans/gattii NOT DETECTED NOT DETECTED Final    Comment: Performed at [facility], [address] [place], [place] [address]  Resp Panel by RT-PCR (Flu A&B, Covid) Nasopharyngeal Swab     Status: None   Collection Time: 02/23/21  8:24 PM   Specimen: Nasopharyngeal Swab; Nasopharyngeal(NP) swabs in vial transport medium  Result Value Ref Range Status   SARS Coronavirus 2 by RT PCR NEGATIVE NEGATIVE Final    Comment: (NOTE) SARS-CoV-2 target nucleic  acids are NOT DETECTED.  The SARS-CoV-2 RNA is generally detectable in upper respiratory specimens during the acute phase of infection. The lowest concentration of SARS-CoV-2 viral copies this assay can detect is 138 copies/mL. A negative result does not preclude SARS-Cov-2 infection and should not be used as the sole basis for treatment or other patient management decisions. A negative result may occur with  improper specimen collection/handling, submission of specimen other than nasopharyngeal swab, presence of viral mutation(s) within the areas targeted by this assay, and inadequate number of viral copies(<138 copies/mL). A negative result must be combined with clinical observations, patient history, and epidemiological information. The expected result is Negative.  Fact Sheet for Patients:  BloggerCourse.com  Fact Sheet for Healthcare Providers:  SeriousBroker.it  This test is no t yet approved or cleared by the [place] FDA and  has been authorized for detection and/or diagnosis of SARS-CoV-2 by FDA under an Emergency Use Authorization (EUA). This EUA will remain  in effect (meaning this test can be used) for the duration of the COVID-19 declaration under Section 564(b)(1) of the Act, 21 U.S.C.section 360bbb-3(b)(1), unless the authorization is terminated  or revoked sooner.       Influenza A by PCR NEGATIVE NEGATIVE Final   Influenza B by PCR NEGATIVE NEGATIVE  Final    Comment: (NOTE) The Xpert Xpress SARS-CoV-2/FLU/RSV plus assay is intended as an aid in the diagnosis of influenza from Nasopharyngeal swab specimens and should not be used as a sole basis for treatment. Nasal washings and aspirates are unacceptable for Xpert Xpress SARS-CoV-2/FLU/RSV testing.  Fact Sheet for Patients: BloggerCourse.com  Fact Sheet for Healthcare Providers: SeriousBroker.it  This test is not yet approved or cleared by the [place] FDA and has been authorized for detection and/or diagnosis of SARS-CoV-2 by FDA under an Emergency Use Authorization (EUA). This EUA will remain in effect (meaning this test can be used) for the duration of the COVID-19 declaration under Section 564(b)(1) of the Act, 21 U.S.C. section 360bbb-3(b)(1), unless the authorization is terminated or revoked.  Performed at [facility], [address], [place], [place] [address]   MRSA Next Gen by PCR, Nasal     Status: None   Collection Time: 02/23/21 11:37 PM   Specimen: Nasal Mucosa; Nasal Swab  Result Value Ref Range Status   MRSA by PCR Next Gen NOT DETECTED NOT DETECTED Final    Comment: (NOTE) The GeneXpert MRSA Assay (FDA approved for NASAL specimens only), is one component of a comprehensive MRSA colonization surveillance program. It is not intended to diagnose MRSA infection nor to guide or monitor treatment for MRSA infections. Test performance is not FDA approved in patients less than 53 years old. Performed at [facility], [address] [place], [place] [address]   Blood culture (routine x 2)     Status: None   Collection Time: 02/24/21  2:04 AM   Specimen: BLOOD LEFT HAND  Result Value Ref Range Status   Specimen Description BLOOD LEFT HAND  Final   Special Requests   Final    BOTTLES DRAWN AEROBIC ONLY Blood Culture adequate volume   Culture   Final    NO GROWTH 5 DAYS Performed at [facility], [address], [place], [place] [address]    Report Status 03/01/2021 FINAL  Final          Radiology Studies: No results found.      Scheduled Meds:  sodium chloride  Intravenous Once   atorvastatin  40 mg Oral QHS   heparin  5,000 Units Subcutaneous Q8H   insulin aspart  0-9 Units Subcutaneous Q4H   insulin detemir  10 Units Subcutaneous Daily   mouth rinse  15 mL Mouth Rinse BID   multivitamin with minerals  1 tablet Oral Daily   neomycin-bacitracin-polymyxin   Topical BID   saccharomyces boulardii  250 mg Oral BID   sodium chloride flush  10-40 mL Intracatheter Q12H   sodium zirconium cyclosilicate  10 g Oral Once   Continuous Infusions:  metronidazole 500 mg (03/03/21 0649)     LOS: 8 days    Time spent: 35 minutes.     Alba Cory, MD [facility]   If 7PM-7AM, please contact night-coverage [email or web address]  03/03/2021, 3:05 PM

## 2021-03-04 DIAGNOSIS — E1165 Type 2 diabetes mellitus with hyperglycemia: Secondary | ICD-10-CM

## 2021-03-04 LAB — CBC
HCT: 31.2 % — ABNORMAL LOW (ref 36.0–46.0)
Hemoglobin: 10.2 g/dL — ABNORMAL LOW (ref 12.0–15.0)
MCH: 31.1 pg (ref 26.0–34.0)
MCHC: 32.7 g/dL (ref 30.0–36.0)
MCV: 95.1 fL (ref 80.0–100.0)
Platelets: 342 10*3/uL (ref 150–400)
RBC: 3.28 MIL/uL — ABNORMAL LOW (ref 3.87–5.11)
RDW: 20.3 % — ABNORMAL HIGH (ref 11.5–15.5)
WBC: 6.7 10*3/uL (ref 4.0–10.5)
nRBC: 0 % (ref 0.0–0.2)

## 2021-03-04 LAB — BASIC METABOLIC PANEL
Anion gap: 5 (ref 5–15)
BUN: 7 mg/dL (ref 6–20)
CO2: 20 mmol/L — ABNORMAL LOW (ref 22–32)
Calcium: 8.7 mg/dL — ABNORMAL LOW (ref 8.9–10.3)
Chloride: 109 mmol/L (ref 98–111)
Creatinine, Ser: 0.68 mg/dL (ref 0.44–1.00)
GFR, Estimated: >60 mL/min (ref >60)
Glucose, Bld: 88 mg/dL (ref 70–99)
Potassium: 4.9 mmol/L (ref 3.5–5.1)
Sodium: 134 mmol/L — ABNORMAL LOW (ref 135–145)

## 2021-03-04 LAB — GLUCOSE, CAPILLARY
Glucose-Capillary: 112 mg/dL — ABNORMAL HIGH (ref 70–99)
Glucose-Capillary: 124 mg/dL — ABNORMAL HIGH (ref 70–99)
Glucose-Capillary: 209 mg/dL — ABNORMAL HIGH (ref 70–99)
Glucose-Capillary: 76 mg/dL (ref 70–99)

## 2021-03-04 MED ORDER — GLUCERNA SHAKE PO LIQD: 237.0000 mL | Freq: Three times a day (TID) | ORAL | Status: DC

## 2021-03-04 MED ORDER — SACCHAROMYCES BOULARDII 250 MG PO CAPS: 250.0000 mg | ORAL_CAPSULE | Freq: Two times a day (BID) | ORAL | 0 refills | Status: AC

## 2021-03-04 MED ORDER — METRONIDAZOLE 500 MG PO TABS: 500.0000 mg | ORAL_TABLET | Freq: Two times a day (BID) | ORAL | 0 refills | Status: AC

## 2021-03-04 MED ORDER — INSULIN GLARGINE 100 UNIT/ML SOLOSTAR PEN: 8.0000 [IU] | PEN_INJECTOR | Freq: Every day | SUBCUTANEOUS | 0 refills | Status: DC

## 2021-03-04 MED ORDER — BACITRACIN-NEOMYCIN-POLYMYXIN OINTMENT TUBE: 1.0000 "application " | TOPICAL_OINTMENT | Freq: Two times a day (BID) | CUTANEOUS | 0 refills | Status: DC

## 2021-03-04 NOTE — Progress Notes (Signed)
Palliative Medicine Inpatient Follow Up Note  Consulting Provider: Margaretha Seeds, MD   Reason for consult:   [place] Palliative Medicine Consult  Reason for Consult? Seen by palliative as outpatient. Readmission for DKA. Brittle DKA    HPI:  Per intake H&P --> 60 year old with past medical history significant for diabetes, hypertension, nonischemic cardiomyopathy, pulmonary hypertension, CVA, history of cardiogenic shock, AKI, encephalopathy, cachexia, failure to thrive multiple admissions for DKA.  P[facility] to [facility] ED 10/17 with altered mental status and hyperglycemia. Patient was started on tube feeding on 10/19 due to poor oral intake.  Palliative care consulted for goals of care.   [facility] Palliative care team has seen Christine Benitez multiple times from November 2021, June 2022, & July 2022. She is followed by OP Palliative support through [facility] though per  note review she has not been seen since August 2022.   Today's Discussion (03/04/2021):  *Please note that this is a verbal dictation therefore any spelling or grammatical errors are due to the "[place]" system interpretation.  Chart reviewed.    I met with Christine Benitez this morning. She was resting and appeared to be in NAD.  I spoke to the [facility] team. We reviewed the plan for Christine Benitez to transition home. Discussed that they will accommodate care through a LOG for 21 days. Patient will also have follow up with OP Palliative Care through [place].   Objective Assessment: Vital Signs Vitals:   03/03/21 1944 03/04/21 0458  BP: 113/67 109/63  Pulse: 73 72  Resp: 18 18  Temp: 99.2 F (37.3 C) 99 F (37.2 C)  SpO2: 100% 99%    Intake/Output Summary (Last 24 hours) at 03/04/2021 1151 Last data filed at 03/04/2021 0900 Gross per 24 hour  Intake 593.95 ml  Output 1725 ml  Net -1131.05 ml    Last Weight  Most recent update: 03/04/2021  7:02 AM    Weight  42 kg (92 lb 9.5 oz)             Gen: Elderly female appears older than stated age HEENT: Dry mucous membranes, core track is in place CV: Regular rate and rhythm PULM: On room air ABD: soft/nontender EXT: No edema Neuro: Alert and oriented x2  SUMMARY OF RECOMMENDATIONS   DNAR/DNI   MOST and DNR form are on Vynca   Patient and her mother agree that a gastrostomy tube would be of not benefit presently  This situation is complicated by Christine Benitez's lack of insurance at this time. I suspect she would most benefit from hospice care though given the patients consistent wishes she would like to do a trial of rehabilitation. For now the [facility] will try to provide a "LOG" for 21 days of service with [facility]. I highly suspect she will not advance far with services and will maintain ongoing FTT whereby hospice will need to be involved sooner than later. She prior had been a member of the [facility] palliative services which will need to continue.    Patient will discharge to home today.  Time Spent: 15 Greater than 50% of the time was spent in counseling and coordination of care ______________________________________________________________________________________ [place] Team Team Cell Phone: [phone] Please utilize secure chat with additional questions, if there is no response within 30 minutes please call the above phone number  Palliative Medicine Team providers are available by phone from 7am to 7pm daily and can be reached through the team cell phone.  Should this patient require assistance outside of these hours, please call the patient's attending physician.     

## 2021-03-04 NOTE — Progress Notes (Signed)
Nutrition Follow-up / Consult  DOCUMENTATION CODES:   Severe malnutrition in context of chronic illness, Underweight  INTERVENTION:   Glucerna Shake po QID, each supplement provides 220 kcal and 10 grams of protein Continue Magic cup TID with meals, each supplement provides 290 kcal and 9 grams of protein. Continue MVI with minerals daily.  NUTRITION DIAGNOSIS:   Severe Malnutrition related to chronic illness (DM with frequent DKA, NICM, CHF) as evidenced by severe muscle depletion, severe fat depletion, percent weight loss (16% weight loss within 3 months).  Ongoing  GOAL:   Patient will meet greater than or equal to 90% of their needs  Progressing   MONITOR:   PO intake, TF tolerance, Labs  REASON FOR ASSESSMENT:   Consult Enteral/tube feeding initiation and management  ASSESSMENT:   60 yo female admitted with AMS, DKA. PMH includes DM, frequent admissions for DKA, HTN, NICM, pulmonary HTN, chronic cholecystitis with calculus, anemia, CHF, tobacco abuse.  Cortrak feeding tube removed 10/23. Tube feeding off since then.   Received consult for calorie count. No records of meal intake saved.  Spoke with nurse tech, who reports that patient ate most of breakfast today (all of the pancakes, sausage, milk, and juice. She just drank a chocolate Glucerna shake and is requesting another one.   Intake of breakfast today provided 640 kcal and 16 gm protein. Glucerna Shake provides 220 kcal and 10 gm protein. Intake of breakfast and one supplement today has already met > 64% of kcal needs and 43% of protein needs.  Suspect PO intake is adequate with addition of PO supplements.  Diet: dysphagia 3 with thin liquids Meal intakes: 80% of breakfast today  Labs reviewed. Na 134 CBG: 76-112-124  Medications reviewed and include Novolog SSI q 4 hours, Levemir, MVI with minerals, Florastor, IV Flagyl.  Admission weight 37.1 kg Current weight 42 kg  Diet Order:   Diet Order              DIET DYS 3 Room service appropriate? No; Fluid consistency: Thin  Diet effective now                   EDUCATION NEEDS:   Not appropriate for education at this time  Skin:  Skin Assessment: Skin Integrity Issues: Skin Integrity Issues:: Other (Comment) Other: non pressure wound to groin  Last BM:  10/24 type 7  Height:   Ht Readings from Last 1 Encounters:  02/27/21 $RemoveB'5\' 5"'rPbXOxLU$  (1.651 m)    Weight:   Wt Readings from Last 1 Encounters:  03/04/21 42 kg    BMI:  Body mass index is 15.41 kg/m.  Estimated Nutritional Needs:   Kcal:  1350-1550  Protein:  60-70 gm  Fluid:  1.4-1.6 L    Lucas Mallow, RD, LDN, CNSC Please refer to Amion for contact information.

## 2021-03-04 NOTE — Care Management (Cosign Needed)
    Durable Medical Equipment  (From admission, onward)           Start     Ordered   03/04/21 1107  For home use only DME lightweight manual wheelchair with seat cushion  Once       Comments: Patient suffers from previous CVA and failure to thrive which impairs their ability to perform daily activities like ambulating in the home.  A rolling walker will not resolve  issue with performing activities of daily living. A wheelchair will allow patient to safely perform daily activities. Patient is not able to propel themselves in the home using a standard weight wheelchair due to general weakness. Patient can self propel in the lightweight wheelchair. Length of need lifetime. Accessories: elevating leg rests (ELRs), wheel locks, extensions and anti-tippers.   03/04/21 1108

## 2021-03-04 NOTE — Progress Notes (Signed)
Physical Therapy Treatment Patient Details Name: Christine Benitez MRN: [id number] DOB: 23-May-1960 Today's Date: 03/04/2021   History of Present Illness Pt is a 60 y.o. female who presented 02/23/21 with AMS and hyperglycemia. Admitted with DKA, acute encephalopathy, possible sepsis, and metabolic acidosis. PMH: diabetes, hypertension, nonischemic cardiomyopathy, pulmonary hypertension, CVA, history of cardiogenic shock, AKI, encephalopathy, cachexia, failure to thrive multiple admissions    PT Comments    Pt was seen for visit and was surprisingly able to help stand and walk after starting with bed ex and feeding her some bites of frozen supplement.  Pt was more interactive, took the cup of supplement and fed herself.  Follow along with her as her stay permits, with expectation of further progress and increased mobility.  Pt is going home with family support, HHPT expecting to see her.   Encourage longer walks and better intake, increase time OOB to chair as pt tolerates.  Pt is HOH but able to understand PT and follow safety information to walk with help.   Recommendations for follow up therapy are one component of a multi-disciplinary discharge planning process, led by the attending physician.  Recommendations may be updated based on patient status, additional functional criteria and insurance authorization.  Follow Up Recommendations  Home health PT     Assistance Recommended at Discharge Frequent or constant Supervision/Assistance  Equipment Recommendations  Wheelchair (measurements PT);Hospital bed;3in1 (PT)    Recommendations for Other Services       Precautions / Restrictions Precautions Precautions: Fall Precaution Comments: very HOH Restrictions Weight Bearing Restrictions: No     Mobility  Bed Mobility Overal bed mobility: Needs Assistance Bed Mobility: Supine to Sit;Sit to Supine Rolling: Min guard   Supine to sit: Min guard Sit to supine: Min assist   General bed  mobility comments: assist to get legs to bed    Transfers Overall transfer level: Needs assistance Equipment used: Rolling walker (2 wheels) Transfers: Sit to/from Stand Sit to Stand: Min guard;Min assist Stand pivot transfers: Min assist         General transfer comment: light power up and steadying    Ambulation/Gait Ambulation/Gait assistance: Min guard Gait Distance (Feet): 70 Feet (35 x 2) Assistive device: Rolling walker (2 wheels);1 person hand held assist Gait Pattern/deviations: Step-to pattern;Step-through pattern;Decreased stride length;Wide base of support Gait velocity: reduced   General Gait Details: pt is up with O2 sats controlled and denies any dizziness   Stairs             Wheelchair Mobility    Modified Rankin (Stroke Patients Only)       Balance Overall balance assessment: Needs assistance Sitting-balance support: Feet supported;Bilateral upper extremity supported Sitting balance-Leahy Scale: Fair     Standing balance support: Bilateral upper extremity supported;During functional activity Standing balance-Leahy Scale: Poor Standing balance comment: BUE support in static standing. stood almost 2 minutes.                            Cognition Arousal/Alertness: Awake/alert Behavior During Therapy: Flat affect Overall Cognitive Status: No family/caregiver present to determine baseline cognitive functioning                                 General Comments: talked with PT after eating a bit of frozen chocolate supplement        Exercises General Exercises - Lower Extremity Ankle  Circles/Pumps: AAROM;5 reps Quad Sets: AAROM;10 reps    General Comments General comments (skin integrity, edema, etc.): pt is up to walk with help and motivated after eating some frozen supplement, talking with PT about her interest in moving      Pertinent Vitals/Pain Pain Assessment: Faces Faces Pain Scale: Hurts a little  bit Breathing: normal Negative Vocalization: none Facial Expression: smiling or inexpressive Body Language: relaxed Consolability: no need to console PAINAD Score: 0 Facial Expression: Relaxed, neutral Body Movements: Absence of movements Muscle Tension: Relaxed Compliance with ventilator (intubated pts.): N/A Vocalization (extubated pts.): Talking in normal tone or no sound CPOT Total: 0 Pain Location: stiffness in trunk and legs Pain Descriptors / Indicators: Guarding Pain Intervention(s): Monitored during session;Repositioned    Home Living                          Prior Function            PT Goals (current goals can now be found in the care plan section) Acute Rehab PT Goals Patient Stated Goal: look out the door of her room Progress towards PT goals: Progressing toward goals    Frequency    Min 3X/week      PT Plan Current plan remains appropriate    Co-evaluation              AM-PAC PT "6 Clicks" Mobility   Outcome Measure  Help needed turning from your back to your side while in a flat bed without using bedrails?: A Little Help needed moving from lying on your back to sitting on the side of a flat bed without using bedrails?: A Little Help needed moving to and from a bed to a chair (including a wheelchair)?: A Little Help needed standing up from a chair using your arms (e.g., wheelchair or bedside chair)?: A Little Help needed to walk in hospital room?: A Lot Help needed climbing 3-5 steps with a railing? : Total 6 Click Score: 15    End of Session Equipment Utilized During Treatment: Gait belt Activity Tolerance: Patient limited by fatigue Patient left: in bed;with call bell/phone within reach;with bed alarm set Nurse Communication: Mobility status PT Visit Diagnosis: Muscle weakness (generalized) (M62.81);Other symptoms and signs involving the nervous system (R29.898);Other abnormalities of gait and mobility (R26.89)     Time:  2947-6546 PT Time Calculation (min) (ACUTE ONLY): 30 min  Charges:  $Gait Training: 8-22 mins $Therapeutic Exercise: 8-22 mins             Ivar Drape 03/04/2021, 4:44 PM  Samul Dada, PT PhD Acute Rehab Dept. Number: [facility] [id number] and [facility] [phone]

## 2021-03-04 NOTE — Discharge Summary (Signed)
[facility]  Physician Discharge Summary   Patient ID: Christine Benitez MRN: [id number] DOB/AGE: Sep 20, 1960 60 y.o.  Admit date: 02/23/2021 Discharge date: 03/04/2021    PCP: Lupita Dawn, MD  DISCHARGE DIAGNOSES:  Diabetic ketoacidosis, resolved Diabetes mellitus type 2, uncontrolled with hyperglycemia Abscess involving the left groin area Sepsis, present on admission Bacteremia Anemia due to chronic disease Acute metabolic encephalopathy, resolved Chronic diastolic CHF/nonischemic cardiomyopathy   RECOMMENDATIONS FOR OUTPATIENT FOLLOW UP: Palliative care to follow at home   Home Health: PT/RN/social worker Equipment/Devices: Wheelchair  CODE STATUS: DNR  DISCHARGE CONDITION: fair  Diet recommendation: As before  INITIAL HISTORY: 60 year old with past medical history significant for diabetes, hypertension, nonischemic cardiomyopathy, pulmonary hypertension, CVA, history of cardiogenic shock, AKI, encephalopathy, cachexia, failure to thrive multiple admissions for DKA.  Presents to [facility], ED 10/17 with altered mental status and hyperglycemia.  In the ED patient was altered, CO2 less than 7, glucose 486, lactic acid 3.7, beta hydroxybutyric acid more than 8.  She was given 2 L of LR and insulin infusion.  Patient was admitted by CCM.  [facility].  Family confirmed DNR status.  Patient has been followed by palliative care as an outpatient.  Patient was started on tube feeding on 10/19 due to poor oral intake.  Palliative care consulted for goals of care. Tube feeding discontinue after goals of care discussion with patient's Mother.    Patient also diagnosed with sepsis secondary to small abscess in the left inguinal fold, 1 blood culture grew micrococcus.  She received 7 days of IV antibiotics.  Abscess has been draining on its own.  Almost resolved.  Developed diarrhea secondary to tube feedings, diarrhea has resolved.  Patient hemoglobin  decreased to 6.8, no evidence of acute bleed, plan to proceed with 2 units of packed red blood cell transfusion.    Consultations: Critical care medicine Palliative care  Procedures: None   HOSPITAL COURSE:   DKA/diabetes mellitus type 2, uncontrolled with hyperglycemia Compliance with insulin was questionable at the time of admission.  HbA1c 13.  Patient was given usual treatment for DKA with improvement.  Transitioned to subcutaneous insulin and discharged on the same.     Left groin/inguinal abscess/Micrococcus bacteremia/sepsis, present admission  Patient presented with tachycardia, fevers, leukocytosis wbc 18 Treated with antibiotics with improvement.  Abscess is noted to be smaller.  Outpatient monitoring.  Chest x ray: Atelectasis at the bases. UA: Negative white blood cell Urine culture was not sent. Treated with Ceftriaxone and flagyl  for 7 days.  Abscess smaller, draining.    Anemia chronic disease Anemia panel did not show any clear deficiencies.  She was transfused PRBCs during this hospital stay.  No overt bleeding was identified   Hypotension Resolved after beta-blocker was discontinued.   Acute metabolic encephalopathy: Status likely related to DKA and metabolic derangement.  Acute on chronic.    AKI Resolved with IV fluid  Hyperkalemia;  Improved after she was given Lokelma.   Chronic diastolic heart failure, nonischemic cardiomyopathy: Stable.   History of CVA Stable.  Resume antiplatelet agents.   Goals of care Seen by palliative care.  Prognosis thought to be guarded.  Palliative care to continue to follow the patient at home.  Patient is DNR.  Severe protein calorie malnutrition Nutrition Problem: Severe Malnutrition Etiology: chronic illness (DM with frequent DKA, NICM, CHF)  Signs/Symptoms: severe muscle depletion, severe fat depletion, percent weight loss (16% weight loss within 3 months) Percent weight loss: 16 %  Interventions: Tube  feeding, Ensure Enlive (each supplement provides 350kcal and 20 grams of protein), MVI, Magic cup  Patient stable.  Home health has been arranged.  Prognosis remains guarded.  Okay for discharge today with close follow-up by palliative care.  PERTINENT LABS:  The results of significant diagnostics from this hospitalization (including imaging, microbiology, ancillary and laboratory) are listed below for reference.    Microbiology: Recent Results (from the past 240 hour(s))  Blood culture (routine x 2)     Status: Abnormal   Collection Time: 02/23/21  5:28 PM   Specimen: BLOOD  Result Value Ref Range Status   Specimen Description BLOOD CENTRAL LINE  Final   Special Requests   Final    BOTTLES DRAWN AEROBIC AND ANAEROBIC Blood Culture adequate volume   Culture  Setup Time   Final    GRAM POSITIVE COCCI IN CLUSTERS AEROBIC BOTTLE ONLY CRITICAL RESULT CALLED TO, READ BACK BY AND VERIFIED WITH: PHARMD C AMEND [id number] FCP    Culture (A)  Final    MICROCOCCUS SPECIES Standardized susceptibility testing for this organism is not available. Performed at [facility], [address], [place], [place] [address]    Report Status 02/26/2021 FINAL  Final  Blood Culture ID Panel (Reflexed)     Status: None   Collection Time: 02/23/21  5:28 PM  Result Value Ref Range Status   Enterococcus faecalis NOT DETECTED NOT DETECTED Final   Enterococcus Faecium NOT DETECTED NOT DETECTED Final   Listeria monocytogenes NOT DETECTED NOT DETECTED Final   Staphylococcus species NOT DETECTED NOT DETECTED Final   Staphylococcus aureus (BCID) NOT DETECTED NOT DETECTED Final   Staphylococcus epidermidis NOT DETECTED NOT DETECTED Final   Staphylococcus lugdunensis NOT DETECTED NOT DETECTED Final   Streptococcus species NOT DETECTED NOT DETECTED Final   Streptococcus agalactiae NOT DETECTED NOT DETECTED Final   Streptococcus pneumoniae NOT DETECTED NOT DETECTED Final   Streptococcus pyogenes NOT DETECTED  NOT DETECTED Final   A.calcoaceticus-baumannii NOT DETECTED NOT DETECTED Final   Bacteroides fragilis NOT DETECTED NOT DETECTED Final   Enterobacterales NOT DETECTED NOT DETECTED Final   Enterobacter cloacae complex NOT DETECTED NOT DETECTED Final   Escherichia coli NOT DETECTED NOT DETECTED Final   Klebsiella aerogenes NOT DETECTED NOT DETECTED Final   Klebsiella oxytoca NOT DETECTED NOT DETECTED Final   Klebsiella pneumoniae NOT DETECTED NOT DETECTED Final   Proteus species NOT DETECTED NOT DETECTED Final   Salmonella species NOT DETECTED NOT DETECTED Final   Serratia marcescens NOT DETECTED NOT DETECTED Final   Haemophilus influenzae NOT DETECTED NOT DETECTED Final   Neisseria meningitidis NOT DETECTED NOT DETECTED Final   Pseudomonas aeruginosa NOT DETECTED NOT DETECTED Final   Stenotrophomonas maltophilia NOT DETECTED NOT DETECTED Final   Candida albicans NOT DETECTED NOT DETECTED Final   Candida auris NOT DETECTED NOT DETECTED Final   Candida glabrata NOT DETECTED NOT DETECTED Final   Candida krusei NOT DETECTED NOT DETECTED Final   Candida parapsilosis NOT DETECTED NOT DETECTED Final   Candida tropicalis NOT DETECTED NOT DETECTED Final   Cryptococcus neoformans/gattii NOT DETECTED NOT DETECTED Final    Comment: Performed at [facility], [address], [place], [place] [address]  Resp Panel by RT-PCR (Flu A&B, Covid) Nasopharyngeal Swab     Status: None   Collection Time: 02/23/21  8:24 PM   Specimen: Nasopharyngeal Swab; Nasopharyngeal(NP) swabs in vial transport medium  Result Value Ref Range Status   SARS Coronavirus 2  by RT PCR NEGATIVE NEGATIVE Final    Comment: (NOTE) SARS-CoV-2 target nucleic acids are NOT DETECTED.  The SARS-CoV-2 RNA is generally detectable in upper respiratory specimens during the acute phase of infection. The lowest concentration of SARS-CoV-2 viral copies this assay can detect is 138 copies/mL. A negative result does not preclude  SARS-Cov-2 infection and should not be used as the sole basis for treatment or other patient management decisions. A negative result may occur with  improper specimen collection/handling, submission of specimen other than nasopharyngeal swab, presence of viral mutation(s) within the areas targeted by this assay, and inadequate number of viral copies(<138 copies/mL). A negative result must be combined with clinical observations, patient history, and epidemiological information. The expected result is Negative.  Fact Sheet for Patients:  BloggerCourse.com  Fact Sheet for Healthcare Providers:  SeriousBroker.it  This test is no t yet approved or cleared by the [place] FDA and  has been authorized for detection and/or diagnosis of SARS-CoV-2 by FDA under an Emergency Use Authorization (EUA). This EUA will remain  in effect (meaning this test can be used) for the duration of the COVID-19 declaration under Section 564(b)(1) of the Act, 21 U.S.C.section 360bbb-3(b)(1), unless the authorization is terminated  or revoked sooner.       Influenza A by PCR NEGATIVE NEGATIVE Final   Influenza B by PCR NEGATIVE NEGATIVE Final    Comment: (NOTE) The Xpert Xpress SARS-CoV-2/FLU/RSV plus assay is intended as an aid in the diagnosis of influenza from Nasopharyngeal swab specimens and should not be used as a sole basis for treatment. Nasal washings and aspirates are unacceptable for Xpert Xpress SARS-CoV-2/FLU/RSV testing.  Fact Sheet for Patients: BloggerCourse.com  Fact Sheet for Healthcare Providers: SeriousBroker.it  This test is not yet approved or cleared by the [place] FDA and has been authorized for detection and/or diagnosis of SARS-CoV-2 by FDA under an Emergency Use Authorization (EUA). This EUA will remain in effect (meaning this test can be used) for the duration of  the COVID-19 declaration under Section 564(b)(1) of the Act, 21 U.S.C. section 360bbb-3(b)(1), unless the authorization is terminated or revoked.  Performed at [facility], [address], [place], [place] [address]   MRSA Next Gen by PCR, Nasal     Status: None   Collection Time: 02/23/21 11:37 PM   Specimen: Nasal Mucosa; Nasal Swab  Result Value Ref Range Status   MRSA by PCR Next Gen NOT DETECTED NOT DETECTED Final    Comment: (NOTE) The GeneXpert MRSA Assay (FDA approved for NASAL specimens only), is one component of a comprehensive MRSA colonization surveillance program. It is not intended to diagnose MRSA infection nor to guide or monitor treatment for MRSA infections. Test performance is not FDA approved in patients less than 53 years old. Performed at [facility], [address] [place], [place] [address]   Blood culture (routine x 2)     Status: None   Collection Time: 02/24/21  2:04 AM   Specimen: BLOOD LEFT HAND  Result Value Ref Range Status   Specimen Description BLOOD LEFT HAND  Final   Special Requests   Final    BOTTLES DRAWN AEROBIC ONLY Blood Culture adequate volume   Culture   Final    NO GROWTH 5 DAYS Performed at [facility], [address] [place], [place] [address]    Report Status 03/01/2021 FINAL  Final     Labs:  COVID-19 Labs   Lab  Results  Component Value Date   SARSCOV2NAA NEGATIVE 02/23/2021   SARSCOV2NAA NEGATIVE 12/25/2020   SARSCOV2NAA NEGATIVE 11/02/2020   SARSCOV2NAA NEGATIVE 10/24/2020      Basic Metabolic Panel: Recent Labs  Lab 02/26/21 1600 02/27/21 0143 02/28/21 0226 03/01/21 0204 03/02/21 0835 03/03/21 0818 03/03/21 1051 03/03/21 2139 03/04/21 0257  NA 136  --  135 135 134* 134* 133* 136 134*  K 4.6  --  4.7 4.9 5.0 5.5* 5.4* 4.8 4.9  CL 103  --  105 102 102 107 107 107 109  CO2 25  --  26 27 24  21* 21* 23 20*  GLUCOSE 223*  --  264* 146* 207* 174* 245* 76 88  BUN 14  --  14 11 10 9 8  7 7   CREATININE 0.78  --  0.84 0.77 1.00 0.83 0.90 0.82 0.68  CALCIUM 8.3*  --  8.1* 8.4* 8.3* 8.4* 8.4* 8.9 8.7*  MG 2.0 1.9 1.9 1.7 2.1 1.8  --   --   --   PHOS 1.7* 5.0* 2.6 2.9  --   --   --   --   --     CBC: Recent Labs  Lab 02/28/21 0226 03/01/21 0204 03/02/21 0138 03/03/21 0818 03/03/21 2139 03/04/21 0257  WBC 6.0 6.6 6.5 5.7  --  6.7  HGB 7.1* 7.4* 7.5* 6.8* 11.0* 10.2*  HCT 22.1* 22.7* 23.6* 21.8* 32.9* 31.2*  MCV 96.5 97.0 100.4* 102.8*  --  95.1  PLT 189 230 299 344  --  342    CBG: Recent Labs  Lab 03/03/21 2106 03/04/21 0027 03/04/21 0448 03/04/21 0717 03/04/21 1109  GLUCAP 85 76 112* 124* 209*     IMAGING STUDIES CT HEAD WO CONTRAST (03/06/21)  Result Date: 02/23/2021 CLINICAL DATA:  Altered mental status for several hours EXAM: CT HEAD WITHOUT CONTRAST TECHNIQUE: Contiguous axial images were obtained from the base of the skull through the vertex without intravenous contrast. COMPARISON:  11/25/2020 FINDINGS: Brain: No evidence of acute infarction, hemorrhage, hydrocephalus, extra-axial collection or mass lesion/mass effect. Mild atrophic changes and chronic white matter ischemic changes are seen. Vascular: No hyperdense vessel or unexpected calcification. Skull: Normal. Negative for fracture or focal lesion. Sinuses/Orbits: No acute finding. Other: None. IMPRESSION: Chronic atrophic and ischemic changes without acute abnormality. Electronically Signed   By: 02/25/2021 M.D.   On: 02/23/2021 19:51   CT PELVIS W CONTRAST  Result Date: 02/27/2021 CLINICAL DATA:  Sepsis EXAM: CT PELVIS WITH CONTRAST TECHNIQUE: Multidetector CT imaging of the pelvis was performed using the standard protocol following the bolus administration of intravenous contrast. CONTRAST:  31mL OMNIPAQUE IOHEXOL 350 MG/ML SOLN COMPARISON:  11/25/2020, 12/30/2020 FINDINGS: Urinary Tract:  Distal ureters and bladder are grossly unremarkable. Bowel: No bowel obstruction or ileus. Moderate fecal  retention within the distal colon. No bowel wall thickening or inflammatory change. Vascular/Lymphatic: Mild atherosclerosis.  No pathologic adenopathy. Reproductive:  No mass or other significant abnormality Other: There is no free intraperitoneal fluid or free gas. Interval development of moderate presacral edema of uncertain etiology. No abdominal wall hernia. Within the inferior margin of the left inguinal crease there is a focal area of subcutaneous fat stranding measuring 9 mm, which could reflect a small phlegmon. This is best seen on image 38/3. Musculoskeletal: There are no acute or destructive bony lesions. There is subcutaneous fat stranding overlying the sacrococcygeal junction without frank ulceration or tissue defect. No fluid collection or abscess. Reconstructed images demonstrate no additional findings. IMPRESSION:  1. Small 9 mm subcutaneous phlegmon or abscess within the left inguinal fold. 2. Subcutaneous fat stranding overlying the sacrococcygeal junction which could reflect developing changes of decubitus ulcer. No frank ulceration or soft tissue defect at this time. 3. Presacral edema, new since prior study, of uncertain etiology or significance. 4. Moderate fecal retention. 5.  Aortic Atherosclerosis (ICD10-I70.0). Electronically Signed   By: Sharlet Salina M.D.   On: 02/27/2021 16:37   DG CHEST PORT 1 VIEW  Result Date: 02/24/2021 CLINICAL DATA:  Fever EXAM: PORTABLE CHEST 1 VIEW COMPARISON:  02/23/2021 FINDINGS: Central line unchanged with the tip in the SVC above the right atrium. Slight increase in atelectasis at both lung bases. Upper lungs remain clear. No visible effusion. IMPRESSION: Slight worsening of atelectasis at the lung bases. Electronically Signed   By: Paulina Fusi M.D.   On: 02/24/2021 12:39   DG CHEST PORT 1 VIEW  Result Date: 02/23/2021 CLINICAL DATA:  Central line placement. EXAM: PORTABLE CHEST 1 VIEW COMPARISON:  Chest x-ray from same day at 1615 hours.  FINDINGS: New right internal jugular dialysis catheter with tip near the cavoatrial junction. The heart size and mediastinal contours are within normal limits. Normal pulmonary vascularity. No focal consolidation, pleural effusion, or pneumothorax. No acute osseous abnormality. IMPRESSION: 1. New right internal jugular dialysis catheter without complication. Electronically Signed   By: Obie Dredge M.D.   On: 02/23/2021 17:22   DG CHEST PORT 1 VIEW  Result Date: 02/23/2021 CLINICAL DATA:  Altered mental status EXAM: PORTABLE CHEST 1 VIEW COMPARISON:  Chest x-ray dated December 26, 2020 FINDINGS: Cardiac and mediastinal contours are unchanged within normal limits. Lungs are clear. No large pleural effusion or evidence of pneumothorax. IMPRESSION: No active disease. Electronically Signed   By: Allegra Lai M.D.   On: 02/23/2021 16:50   DG Abd Portable 1V  Result Date: 02/25/2021 CLINICAL DATA:  Feeding tube placement. EXAM: PORTABLE ABDOMEN - 1 VIEW COMPARISON:  CT chest, abdomen, and pelvis 11/25/2020. Abdominal radiograph 10/24/2020. FINDINGS: A feeding tube has been placed with tip at the level of the duodenal bulb. There is mild gaseous distension of the stomach. A moderately large amount of stool is present throughout the colon. No dilated loops of bowel are seen to suggest obstruction. No acute osseous abnormality is seen. IMPRESSION: Feeding tube terminate at the level of the duodenal bulb. Electronically Signed   By: Sebastian Ache M.D.   On: 02/25/2021 15:00   ECHOCARDIOGRAM LIMITED  Result Date: 02/25/2021    ECHOCARDIOGRAM LIMITED REPORT   Patient Name:   Christine Benitez Date of Exam: 02/25/2021 Medical Rec #:  [id number]       Height:       65.0 in Accession #:    [id number]      Weight:       85.8 lb Date of Birth:  [birth year]       BSA:          1.380 m Patient Age:    60 years        BP:           109/69 mmHg Patient Gender: F               HR:           97 bpm. Exam Location:   Inpatient Procedure: Limited Echo, Cardiac Doppler, Color Doppler and 3D Echo Indications:    Fever  History:        Patient has prior  history of Echocardiogram examinations, most                 recent 10/25/2020. CHF and Cardiomyopathy, Abnormal ECG, Stroke,                 Signs/Symptoms:Bacteremia and Altered Mental Status; Risk                 Factors:Current Smoker, Hypertension and Diabetes. Cardiac                 shock.  Sonographer:    Sheralyn Boatman RDCS Referring Phys: [id number] LAURA R GLEASON IMPRESSIONS  1. Left ventricular ejection fraction, by estimation, is 45 to 50%. The left ventricle has mildly decreased function. The left ventricle demonstrates global hypokinesis. There is severe concentric left ventricular hypertrophy. Left ventricular diastolic  parameters are indeterminate. The average left ventricular global longitudinal strain is -13.8 %. The global longitudinal strain is abnormal.  2. Right ventricular systolic function is normal. The right ventricular size is normal.  3. The mitral valve is normal in structure. Mild mitral valve regurgitation. No evidence of mitral stenosis.  4. The aortic valve is normal in structure. Aortic valve regurgitation is not visualized. Mild aortic valve sclerosis is present, with no evidence of aortic valve stenosis.  5. The inferior vena cava is normal in size with greater than 50% respiratory variability, suggesting right atrial pressure of 3 mmHg. FINDINGS  Left Ventricle: Left ventricular ejection fraction, by estimation, is 45 to 50%. The left ventricle has mildly decreased function. The left ventricle demonstrates global hypokinesis. The average left ventricular global longitudinal strain is -13.8 %. The global longitudinal strain is abnormal. The left ventricular internal cavity size was small. There is severe concentric left ventricular hypertrophy. Left ventricular diastolic parameters are indeterminate. Right Ventricle: The right ventricular size is normal.  No increase in right ventricular wall thickness. Right ventricular systolic function is normal. Left Atrium: Left atrial size was normal in size. Right Atrium: Right atrial size was normal in size. Pericardium: There is no evidence of pericardial effusion. Mitral Valve: The mitral valve is normal in structure. Mild mitral valve regurgitation. No evidence of mitral valve stenosis. Tricuspid Valve: The tricuspid valve is normal in structure. Tricuspid valve regurgitation is trivial. No evidence of tricuspid stenosis. Aortic Valve: The aortic valve is normal in structure. Aortic valve regurgitation is not visualized. Mild aortic valve sclerosis is present, with no evidence of aortic valve stenosis. Pulmonic Valve: The pulmonic valve was normal in structure. Pulmonic valve regurgitation is not visualized. No evidence of pulmonic stenosis. Aorta: The aortic root is normal in size and structure. Venous: The inferior vena cava is normal in size with greater than 50% respiratory variability, suggesting right atrial pressure of 3 mmHg. IAS/Shunts: No atrial level shunt detected by color flow Doppler. LEFT VENTRICLE PLAX 2D LVIDd:         3.10 cm LVIDs:         2.50 cm     2D Longitudinal Strain LV PW:         1.70 cm     2D Strain GLS Avg:     -13.8 % LV IVS:        1.60 cm  LV Volumes (MOD)           3D Volume EF: LV vol d, MOD A2C: 55.8 ml 3D EF:        47 % LV vol d, MOD A4C: 46.9 ml LV EDV:  88 ml LV vol s, MOD A2C: 27.6 ml LV ESV:       46 ml LV vol s, MOD A4C: 26.8 ml LV SV:        41 ml LV SV MOD A2C:     28.2 ml LV SV MOD A4C:     46.9 ml LV SV MOD BP:      25.6 ml LEFT ATRIUM         Index LA diam:    2.10 cm 1.52 cm/m   AORTA Ao Root diam: 2.60 cm Kardie Tobb DO Electronically signed by Thomasene Ripple DO Signature Date/Time: 02/25/2021/3:13:12 PM    Final     DISCHARGE EXAMINATION: Vitals:   03/03/21 1944 03/04/21 0458 03/04/21 0500 03/04/21 1347  BP: 113/67 109/63  104/63  Pulse: 73 72  66  Resp: 18 18   17   Temp: 99.2 F (37.3 C) 99 F (37.2 C)  98.5 F (36.9 C)  TempSrc: Oral Oral  Oral  SpO2: 100% 99%  99%  Weight:   42 kg   Height:       General appearance: Awake alert.  In no distress distracted Resp: Clear to auscultation bilaterally.  Normal effort Cardio: S1-S2 is normal regular.  No S3-S4.  No rubs murmurs or bruit GI: Abdomen is soft.  Nontender nondistended.  Bowel sounds are present normal.  No masses organomegaly    DISPOSITION: Home with home health  Discharge Instructions     Call MD for:  difficulty breathing, headache or visual disturbances   Complete by: As directed    Call MD for:  extreme fatigue   Complete by: As directed    Call MD for:  persistant dizziness or light-headedness   Complete by: As directed    Call MD for:  persistant nausea and vomiting   Complete by: As directed    Call MD for:  severe uncontrolled pain   Complete by: As directed    Call MD for:  temperature >100.4   Complete by: As directed    Discharge instructions   Complete by: As directed    Please take your medications as prescribed.  You were cared for by a hospitalist during your hospital stay. If you have any questions about your discharge medications or the care you received while you were in the hospital after you are discharged, you can call the unit and asked to speak with the hospitalist on call if the hospitalist that took care of you is not available. Once you are discharged, your primary care physician will handle any further medical issues. Please note that NO REFILLS for any discharge medications will be authorized once you are discharged, as it is imperative that you return to your primary care physician (or establish a relationship with a primary care physician if you do not have one) for your aftercare needs so that they can reassess your need for medications and monitor your lab values. If you do not have a primary care physician, you can call [phone] for a physician  referral.   Increase activity slowly   Complete by: As directed    No wound care   Complete by: As directed          Allergies as of 03/04/2021   No Known Allergies      Medication List     STOP taking these medications    aspirin 81 MG EC tablet   dronabinol 5 MG capsule Commonly known as: MARINOL  feeding supplement Liqd   ivabradine 5 MG Tabs tablet Commonly known as: Corlanor   mirtazapine 15 MG disintegrating tablet Commonly known as: REMERON SOL-TAB   multivitamin with minerals Tabs tablet       TAKE these medications    acetaminophen 650 MG CR tablet Commonly known as: TYLENOL Take 1,300 mg by mouth every 8 (eight) hours as needed for pain.   albuterol 108 (90 Base) MCG/ACT inhaler Commonly known as: VENTOLIN HFA Inhale 2 puffs into the lungs every 6 (six) hours as needed for wheezing or shortness of breath.   atorvastatin 40 MG tablet Commonly known as: LIPITOR Take 1 tablet (40 mg total) by mouth every evening.   clopidogrel 75 MG tablet Commonly known as: Plavix Take 1 tablet (75 mg total) by mouth daily.   insulin glargine 100 UNIT/ML Solostar Pen Commonly known as: LANTUS Inject 8 Units into the skin daily. What changed:  how much to take when to take this   metroNIDAZOLE 500 MG tablet Commonly known as: Flagyl Take 1 tablet (500 mg total) by mouth 2 (two) times daily for 3 days.   neomycin-bacitracin-polymyxin Oint Commonly known as: NEOSPORIN Apply 1 application topically 2 (two) times daily. To wound in the groin   NovoLOG FlexPen 100 UNIT/ML FlexPen Generic drug: insulin aspart Inject 5 Units into the skin 3 (three) times daily with meals.   Pentips 32G X 4 MM Misc Generic drug: Insulin Pen Needle Use as directed   saccharomyces boulardii 250 MG capsule Commonly known as: FLORASTOR Take 1 capsule (250 mg total) by mouth 2 (two) times daily for 5 days.          Follow-up Information     [facility]  Follow up.   Specialty: Home Health Services Why: Registered Nurse, Physical Therapy, Social Work-letter of guarantee- office to call with visit times. Contact information: [address] [place] [address] [phone]         Lupita Dawn, MD Follow up.   Specialty: Family Medicine Contact information: [address] [place] [address] [phone]         [facility] Follow up.   Why: Wheelchair to be delivered to the home. Contact information: [address] [place] [address] [phone]                 TOTAL DISCHARGE TIME: 35 minutes  Deddrick Saindon  [facility] Pager on [email or web address]  03/05/2021, 12:58 PM

## 2021-03-04 NOTE — Care Management (Signed)
1110 03-04-21 Case Manager spoke with patients daughter regarding durable medical equipment wheelchair, bedside commode, and hospital bed. Daughter declined the bedside commode-per daughter the patient has one at home and she declined the hospital bed at this time. Per daughter she is still comfortable in her own bed. Patient has a rolling walker in the home. Case Manager called [employer] for charity wheelchair and it will be delivered to the home. Patient will be transported home via [facility]. No further needs from Case Manager at this time.

## 2021-03-04 NOTE — Progress Notes (Signed)
Occupational Therapy Treatment Patient Details Name: Christine Benitez MRN: [id number] DOB: 06-29-1960 Today's Date: 03/04/2021   History of present illness Pt is a 60 y.o. female who presented 02/23/21 with AMS and hyperglycemia. Admitted with DKA, acute encephalopathy, possible sepsis, and metabolic acidosis. PMH: diabetes, hypertension, nonischemic cardiomyopathy, pulmonary hypertension, CVA, history of cardiogenic shock, AKI, encephalopathy, cachexia, failure to thrive multiple admissions   OT comments  Pt progressing towards acute OT goals. Focus of session was taking pivotal steps to access BSC. Pt then able to stand and support self with BUE support of rw, total A for pericare tasks. D/c plan updated to below.    Recommendations for follow up therapy are one component of a multi-disciplinary discharge planning process, led by the attending physician.  Recommendations may be updated based on patient status, additional functional criteria and insurance authorization.    Follow Up Recommendations  Home health OT    Assistance Recommended at Discharge Frequent or constant Supervision/Assistance  Equipment Recommendations  Wheelchair (measurements OT);Wheelchair cushion (measurements OT)    Recommendations for Other Services      Precautions / Restrictions Precautions Precautions: Fall Precaution Comments: very HOH Restrictions Weight Bearing Restrictions: No       Mobility Bed Mobility Overal bed mobility: Needs Assistance Bed Mobility: Supine to Sit;Sit to Supine     Supine to sit: Min guard Sit to supine: Mod assist   General bed mobility comments: extra time and effort to come to EOB position. Mod A to advance BLE onto bed at to return to supine    Transfers Overall transfer level: Needs assistance Equipment used: Rolling walker (2 wheels) Transfers: Sit to/from [employer] Sit to Stand: Min assist Stand pivot transfers: Min assist          General transfer comment: EOB<>BSC. min A to steady and manuever rw leg around BSC leg (decreased awareness that rw was stuck).     Balance Overall balance assessment: Needs assistance Sitting-balance support: Bilateral upper extremity supported;Feet supported Sitting balance-Leahy Scale: Fair     Standing balance support: Bilateral upper extremity supported Standing balance-Leahy Scale: Poor Standing balance comment: BUE support in static standing. stood almost 2 minutes.                           ADL either performed or assessed with clinical judgement   ADL Overall ADL's : Needs assistance/impaired                         Toilet Transfer: Minimal assistance;Stand-pivot;BSC;Rolling walker (2 wheels)   Toileting- Clothing Manipulation and Hygiene: Moderate assistance;Sit to/from stand Toileting - Clothing Manipulation Details (indicate cue type and reason): Pt able to static stand with BUE support of rw, total A for pericare.       General ADL Comments: Improvement in functional transfers this session. Able to sit-pivot with min A and rw. EOB<>BSC. Pericare total assist in standing     Vision   Additional Comments: decreased visual attention/focus, suspect some baseline vision impairments. difficult to assess d/t impaired cognition   Perception     Praxis      Cognition Arousal/Alertness: Awake/alert Behavior During Therapy: Flat affect Overall Cognitive Status: No family/caregiver present to determine baseline cognitive functioning                                 General  Comments: more verbally interactive today.          Exercises     Shoulder Instructions       General Comments      Pertinent Vitals/ Pain       Pain Assessment: Faces Faces Pain Scale: Hurts a little bit Pain Location: generalized Pain Descriptors / Indicators: Grimacing Pain Intervention(s): Monitored during session  Home Living                                           Prior Functioning/Environment              Frequency  Min 2X/week        Progress Toward Goals  OT Goals(current goals can now be found in the care plan section)  Progress towards OT goals: Progressing toward goals  Acute Rehab OT Goals OT Goal Formulation: With patient Time For Goal Achievement: 03/13/21 Potential to Achieve Goals: Good ADL Goals Pt Will Perform Eating: with supervision;with set-up;bed level Pt Will Perform Grooming: with min assist;bed level Pt Will Perform Upper Body Bathing: with mod assist Pt Will Perform Lower Body Bathing: with mod assist;sitting/lateral leans;bed level Pt Will Transfer to Toilet: with min assist;stand pivot transfer;bedside commode;squat pivot transfer Pt Will Perform Toileting - Clothing Manipulation and hygiene: sit to/from stand;sitting/lateral leans;with mod assist Additional ADL Goal #1: Pt will complete bed mobility at min A level to prepare for EOB/OOB.  Plan Discharge plan remains appropriate    Co-evaluation                 AM-PAC OT "6 Clicks" Daily Activity     Outcome Measure   Help from another person eating meals?: A Lot Help from another person taking care of personal grooming?: A Lot Help from another person toileting, which includes using toliet, bedpan, or urinal?: A Little Help from another person bathing (including washing, rinsing, drying)?: A Lot Help from another person to put on and taking off regular upper body clothing?: A Lot Help from another person to put on and taking off regular lower body clothing?: A Lot 6 Click Score: 13    End of Session Equipment Utilized During Treatment: Rolling walker (2 wheels)  OT Visit Diagnosis: Muscle weakness (generalized) (M62.81);Unsteadiness on feet (R26.81);Other symptoms and signs involving the nervous system (R29.898);Other symptoms and signs involving cognitive function;Dizziness and giddiness (R42);Adult,  failure to thrive (R62.7);Pain;Feeding difficulties (R63.3)   Activity Tolerance Patient tolerated treatment well   Patient Left in bed;with call bell/phone within reach;with bed alarm set   Nurse Communication Mobility status        Time: 1141-1210 OT Time Calculation (min): 29 min  Charges: OT General Charges $OT Visit: 1 Visit OT Treatments $Self Care/Home Management : 23-37 mins  Raynald Kemp, OT Acute Rehabilitation Services Pager: [phone] Office: [phone]   Pilar Grammes 03/04/2021, 1:10 PM

## 2021-03-04 NOTE — Progress Notes (Signed)
CSW spoke with patients daughter by phone. Patients daughter confirmed she can receive [facility] from [facility]. Patients daughter confirmed she will receive patient and will have walker ready for patient when patient arrives. CSW informed case Production designer, theatre/television/film. No further questions reported at this time.

## 2021-03-05 LAB — TYPE AND SCREEN
ABO/RH(D): AB POS
Antibody Screen: NEGATIVE
Unit division: 0
Unit division: 0

## 2021-03-05 LAB — BPAM RBC
Blood Product Expiration Date: 202210312359
Blood Product Expiration Date: 202211012359
ISSUE DATE / TIME: 202210251549
Unit Type and Rh: 600
Unit Type and Rh: 600

## 2021-03-11 ENCOUNTER — Telehealth: Payer: Self-pay

## 2021-03-11 NOTE — Telephone Encounter (Signed)
Attempted to contact patient/daughter to schedule a hospital follow up appointment. No answer and voicemail is not setup.

## 2021-05-03 ENCOUNTER — Emergency Department (HOSPITAL_COMMUNITY): Payer: Medicaid Other

## 2021-05-03 ENCOUNTER — Inpatient Hospital Stay (HOSPITAL_COMMUNITY)
Admission: EM | Admit: 2021-05-03 | Discharge: 2021-05-05 | DRG: 637 | Disposition: A | Discharge status: Hospice | Payer: Medicaid Other | Attending: Internal Medicine | Admitting: Internal Medicine

## 2021-05-03 DIAGNOSIS — Z66 Do not resuscitate: Secondary | ICD-10-CM | POA: Diagnosis present

## 2021-05-03 DIAGNOSIS — Z823 Family history of stroke: Secondary | ICD-10-CM

## 2021-05-03 DIAGNOSIS — G40901 Epilepsy, unspecified, not intractable, with status epilepticus: Secondary | ICD-10-CM | POA: Diagnosis present

## 2021-05-03 DIAGNOSIS — Z8249 Family history of ischemic heart disease and other diseases of the circulatory system: Secondary | ICD-10-CM

## 2021-05-03 DIAGNOSIS — I509 Heart failure, unspecified: Secondary | ICD-10-CM | POA: Diagnosis not present

## 2021-05-03 DIAGNOSIS — E785 Hyperlipidemia, unspecified: Secondary | ICD-10-CM | POA: Diagnosis present

## 2021-05-03 DIAGNOSIS — Z8673 Personal history of transient ischemic attack (TIA), and cerebral infarction without residual deficits: Secondary | ICD-10-CM

## 2021-05-03 DIAGNOSIS — Z79899 Other long term (current) drug therapy: Secondary | ICD-10-CM

## 2021-05-03 DIAGNOSIS — Z7902 Long term (current) use of antithrombotics/antiplatelets: Secondary | ICD-10-CM | POA: Diagnosis not present

## 2021-05-03 DIAGNOSIS — I428 Other cardiomyopathies: Secondary | ICD-10-CM | POA: Diagnosis present

## 2021-05-03 DIAGNOSIS — E1010 Type 1 diabetes mellitus with ketoacidosis without coma: Secondary | ICD-10-CM | POA: Diagnosis present

## 2021-05-03 DIAGNOSIS — E875 Hyperkalemia: Secondary | ICD-10-CM | POA: Diagnosis present

## 2021-05-03 DIAGNOSIS — Z515 Encounter for palliative care: Secondary | ICD-10-CM

## 2021-05-03 DIAGNOSIS — Z794 Long term (current) use of insulin: Secondary | ICD-10-CM | POA: Diagnosis not present

## 2021-05-03 DIAGNOSIS — R64 Cachexia: Secondary | ICD-10-CM | POA: Diagnosis present

## 2021-05-03 DIAGNOSIS — R54 Age-related physical debility: Secondary | ICD-10-CM | POA: Diagnosis present

## 2021-05-03 DIAGNOSIS — E43 Unspecified severe protein-calorie malnutrition: Secondary | ICD-10-CM | POA: Diagnosis present

## 2021-05-03 DIAGNOSIS — F17210 Nicotine dependence, cigarettes, uncomplicated: Secondary | ICD-10-CM | POA: Diagnosis present

## 2021-05-03 DIAGNOSIS — I272 Pulmonary hypertension, unspecified: Secondary | ICD-10-CM | POA: Diagnosis present

## 2021-05-03 DIAGNOSIS — G9341 Metabolic encephalopathy: Secondary | ICD-10-CM | POA: Diagnosis present

## 2021-05-03 DIAGNOSIS — I5022 Chronic systolic (congestive) heart failure: Secondary | ICD-10-CM | POA: Diagnosis present

## 2021-05-03 DIAGNOSIS — Z7189 Other specified counseling: Secondary | ICD-10-CM | POA: Diagnosis not present

## 2021-05-03 DIAGNOSIS — I110 Hypertensive heart disease with heart failure: Secondary | ICD-10-CM | POA: Diagnosis present

## 2021-05-03 DIAGNOSIS — Z20822 Contact with and (suspected) exposure to covid-19: Secondary | ICD-10-CM | POA: Diagnosis present

## 2021-05-03 DIAGNOSIS — Z681 Body mass index (BMI) 19 or less, adult: Secondary | ICD-10-CM

## 2021-05-03 LAB — CBC WITH DIFFERENTIAL/PLATELET
Abs Immature Granulocytes: 0.08 10*3/uL — ABNORMAL HIGH (ref 0.00–0.07)
Basophils Absolute: 0.1 10*3/uL (ref 0.0–0.1)
Basophils Relative: 0 %
Eosinophils Absolute: 0 10*3/uL (ref 0.0–0.5)
Eosinophils Relative: 0 %
HCT: 40.5 % (ref 36.0–46.0)
Hemoglobin: 12.6 g/dL (ref 12.0–15.0)
Immature Granulocytes: 0 %
Lymphocytes Relative: 3 %
Lymphs Abs: 0.6 10*3/uL — ABNORMAL LOW (ref 0.7–4.0)
MCH: 31.5 pg (ref 26.0–34.0)
MCHC: 31.1 g/dL (ref 30.0–36.0)
MCV: 101.3 fL — ABNORMAL HIGH (ref 80.0–100.0)
Monocytes Absolute: 0.9 10*3/uL (ref 0.1–1.0)
Monocytes Relative: 5 %
Neutro Abs: 17.4 10*3/uL — ABNORMAL HIGH (ref 1.7–7.7)
Neutrophils Relative %: 92 %
Platelets: 286 10*3/uL (ref 150–400)
RBC: 4 MIL/uL (ref 3.87–5.11)
RDW: 13.3 % (ref 11.5–15.5)
WBC: 19 10*3/uL — ABNORMAL HIGH (ref 4.0–10.5)
nRBC: 0 % (ref 0.0–0.2)

## 2021-05-03 LAB — RESP PANEL BY RT-PCR (FLU A&B, COVID) ARPGX2
Influenza A by PCR: NEGATIVE
Influenza B by PCR: NEGATIVE
SARS Coronavirus 2 by RT PCR: NEGATIVE

## 2021-05-03 LAB — URINALYSIS, MICROSCOPIC (REFLEX)

## 2021-05-03 LAB — VITAMIN B12: Vitamin B-12: 866 pg/mL (ref 180–914)

## 2021-05-03 LAB — URINALYSIS, ROUTINE W REFLEX MICROSCOPIC
Bilirubin Urine: NEGATIVE
Glucose, UA: >=500 mg/dL — AB
Ketones, ur: 40 mg/dL — AB
Leukocytes,Ua: NEGATIVE
Nitrite: NEGATIVE
Protein, ur: NEGATIVE mg/dL
Specific Gravity, Urine: 1.025 (ref 1.005–1.030)
pH: 5.5 (ref 5.0–8.0)

## 2021-05-03 LAB — BLOOD GAS, ARTERIAL
Acid-base deficit: 21.3 mmol/L — ABNORMAL HIGH (ref 0.0–2.0)
Bicarbonate: 6 mmol/L — ABNORMAL LOW (ref 20.0–28.0)
Drawn by: 27011
FIO2: 21
O2 Saturation: 98.1 %
Patient temperature: 37
pCO2 arterial: <19 mmHg — CL (ref 32.0–48.0)
pH, Arterial: 7.164 — CL (ref 7.350–7.450)
pO2, Arterial: 125 mmHg — ABNORMAL HIGH (ref 83.0–108.0)

## 2021-05-03 LAB — BASIC METABOLIC PANEL
Anion gap: 15 (ref 5–15)
Anion gap: 13 (ref 5–15)
BUN: 30 mg/dL — ABNORMAL HIGH (ref 6–20)
BUN: 27 mg/dL — ABNORMAL HIGH (ref 6–20)
BUN: 27 mg/dL — ABNORMAL HIGH (ref 6–20)
BUN: 25 mg/dL — ABNORMAL HIGH (ref 6–20)
BUN: 22 mg/dL — ABNORMAL HIGH (ref 6–20)
CO2: <7 mmol/L — ABNORMAL LOW (ref 22–32)
CO2: <7 mmol/L — ABNORMAL LOW (ref 22–32)
CO2: <7 mmol/L — ABNORMAL LOW (ref 22–32)
CO2: 19 mmol/L — ABNORMAL LOW (ref 22–32)
CO2: 23 mmol/L (ref 22–32)
Calcium: 9.7 mg/dL (ref 8.9–10.3)
Calcium: 8.8 mg/dL — ABNORMAL LOW (ref 8.9–10.3)
Calcium: 10.1 mg/dL (ref 8.9–10.3)
Calcium: 9.1 mg/dL (ref 8.9–10.3)
Calcium: 9.4 mg/dL (ref 8.9–10.3)
Chloride: 99 mmol/L (ref 98–111)
Chloride: 105 mmol/L (ref 98–111)
Chloride: 109 mmol/L (ref 98–111)
Chloride: 104 mmol/L (ref 98–111)
Chloride: 103 mmol/L (ref 98–111)
Creatinine, Ser: 1.94 mg/dL — ABNORMAL HIGH (ref 0.44–1.00)
Creatinine, Ser: 1.71 mg/dL — ABNORMAL HIGH (ref 0.44–1.00)
Creatinine, Ser: 1.71 mg/dL — ABNORMAL HIGH (ref 0.44–1.00)
Creatinine, Ser: 1.39 mg/dL — ABNORMAL HIGH (ref 0.44–1.00)
Creatinine, Ser: 1.13 mg/dL — ABNORMAL HIGH (ref 0.44–1.00)
GFR, Estimated: 29 mL/min — ABNORMAL LOW (ref >60)
GFR, Estimated: 34 mL/min — ABNORMAL LOW (ref >60)
GFR, Estimated: 34 mL/min — ABNORMAL LOW (ref >60)
GFR, Estimated: 44 mL/min — ABNORMAL LOW (ref >60)
GFR, Estimated: 56 mL/min — ABNORMAL LOW (ref >60)
Glucose, Bld: 572 mg/dL (ref 70–99)
Glucose, Bld: 404 mg/dL — ABNORMAL HIGH (ref 70–99)
Glucose, Bld: 185 mg/dL — ABNORMAL HIGH (ref 70–99)
Glucose, Bld: 239 mg/dL — ABNORMAL HIGH (ref 70–99)
Glucose, Bld: 191 mg/dL — ABNORMAL HIGH (ref 70–99)
Potassium: 5.2 mmol/L — ABNORMAL HIGH (ref 3.5–5.1)
Potassium: 4.5 mmol/L (ref 3.5–5.1)
Potassium: 5.8 mmol/L — ABNORMAL HIGH (ref 3.5–5.1)
Potassium: 4.5 mmol/L (ref 3.5–5.1)
Potassium: 4.1 mmol/L (ref 3.5–5.1)
Sodium: 134 mmol/L — ABNORMAL LOW (ref 135–145)
Sodium: 136 mmol/L (ref 135–145)
Sodium: 143 mmol/L (ref 135–145)
Sodium: 138 mmol/L (ref 135–145)
Sodium: 139 mmol/L (ref 135–145)

## 2021-05-03 LAB — GLUCOSE, CAPILLARY
Glucose-Capillary: 161 mg/dL — ABNORMAL HIGH (ref 70–99)
Glucose-Capillary: 192 mg/dL — ABNORMAL HIGH (ref 70–99)
Glucose-Capillary: 229 mg/dL — ABNORMAL HIGH (ref 70–99)
Glucose-Capillary: 222 mg/dL — ABNORMAL HIGH (ref 70–99)
Glucose-Capillary: 213 mg/dL — ABNORMAL HIGH (ref 70–99)
Glucose-Capillary: 182 mg/dL — ABNORMAL HIGH (ref 70–99)
Glucose-Capillary: 188 mg/dL — ABNORMAL HIGH (ref 70–99)

## 2021-05-03 LAB — LACTIC ACID, PLASMA
Lactic Acid, Venous: 3.3 mmol/L (ref 0.5–1.9)
Lactic Acid, Venous: 3.6 mmol/L (ref 0.5–1.9)
Lactic Acid, Venous: 3.7 mmol/L (ref 0.5–1.9)

## 2021-05-03 LAB — CBG MONITORING, ED
Glucose-Capillary: 531 mg/dL (ref 70–99)
Glucose-Capillary: 448 mg/dL — ABNORMAL HIGH (ref 70–99)
Glucose-Capillary: 483 mg/dL — ABNORMAL HIGH (ref 70–99)
Glucose-Capillary: 386 mg/dL — ABNORMAL HIGH (ref 70–99)
Glucose-Capillary: 322 mg/dL — ABNORMAL HIGH (ref 70–99)

## 2021-05-03 LAB — HEMOGLOBIN A1C
Hgb A1c MFr Bld: 13 % — ABNORMAL HIGH (ref 4.8–5.6)
Mean Plasma Glucose: 326.4 mg/dL

## 2021-05-03 LAB — HEPATIC FUNCTION PANEL
ALT: 15 U/L (ref 0–44)
AST: 19 U/L (ref 15–41)
Albumin: 3.5 g/dL (ref 3.5–5.0)
Alkaline Phosphatase: 58 U/L (ref 38–126)
Bilirubin, Direct: 0.3 mg/dL — ABNORMAL HIGH (ref 0.0–0.2)
Indirect Bilirubin: 2.2 mg/dL — ABNORMAL HIGH (ref 0.3–0.9)
Total Bilirubin: 2.5 mg/dL — ABNORMAL HIGH (ref 0.3–1.2)
Total Protein: 6.3 g/dL — ABNORMAL LOW (ref 6.5–8.1)

## 2021-05-03 LAB — BETA-HYDROXYBUTYRIC ACID
Beta-Hydroxybutyric Acid: >8 mmol/L — ABNORMAL HIGH (ref 0.05–0.27)
Beta-Hydroxybutyric Acid: 1.67 mmol/L — ABNORMAL HIGH (ref 0.05–0.27)

## 2021-05-03 LAB — OSMOLALITY: Osmolality: 338 mOsm/kg (ref 275–295)

## 2021-05-03 LAB — HIV ANTIBODY (ROUTINE TESTING W REFLEX): HIV Screen 4th Generation wRfx: NONREACTIVE

## 2021-05-03 LAB — TROPONIN I (HIGH SENSITIVITY)
Troponin I (High Sensitivity): 6 ng/L (ref <18)
Troponin I (High Sensitivity): 6 ng/L (ref <18)

## 2021-05-03 LAB — MAGNESIUM: Magnesium: 2 mg/dL (ref 1.7–2.4)

## 2021-05-03 LAB — AMMONIA: Ammonia: 38 umol/L — ABNORMAL HIGH (ref 9–35)

## 2021-05-03 IMAGING — CT CT HEAD W/O CM
4 series · 15 of 47 positions shown, 17 images · non-contrast
Comparison: [DATE]

CLINICAL DATA: Altered mental status

EXAM:
CT HEAD WITHOUT CONTRAST
TECHNIQUE: Contiguous axial images were obtained from the base of the skull
through the vertex without intravenous contrast.

[Series 3: head wo · axial · 0.30mm/px · z∈[-152,-50]mm · 7 of 29 slices shown, 9 images]
[im 4/29  brain]
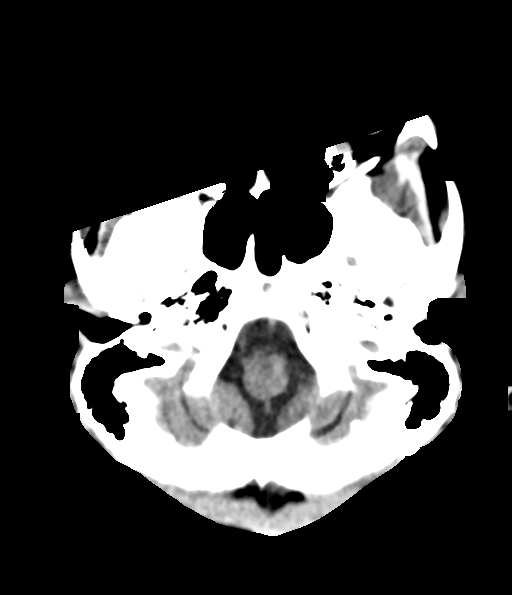
[im 4/29  bone]
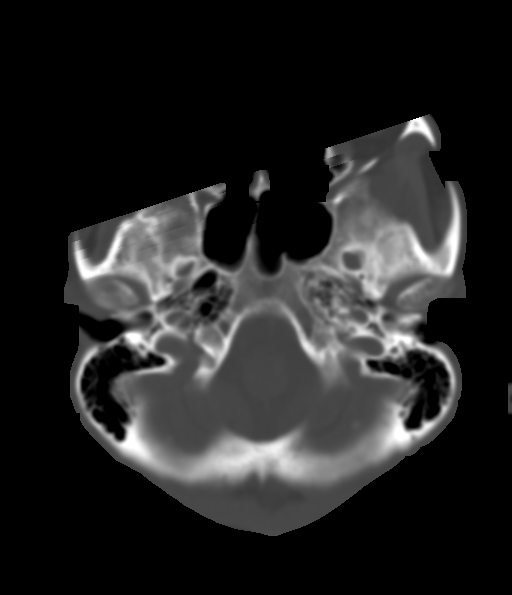
[im 8/29  brain]
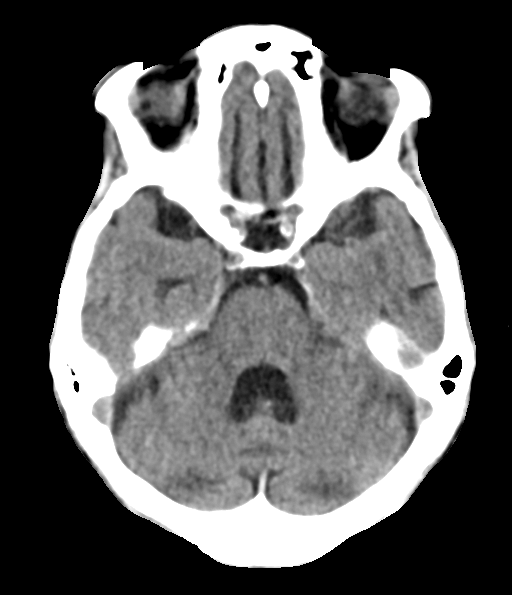
[im 11/29  brain]
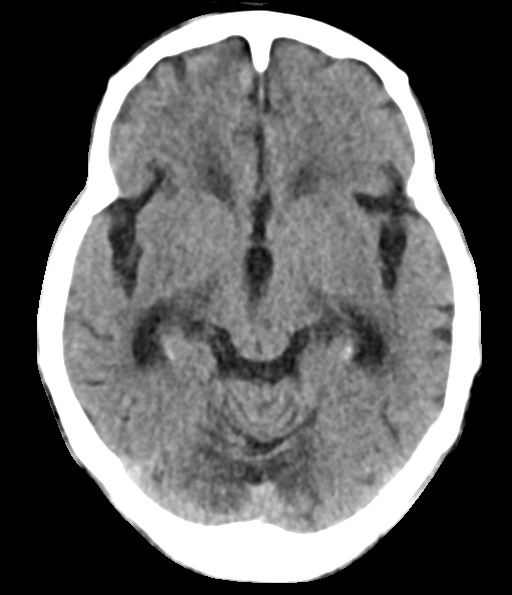
[im 15/29  brain]
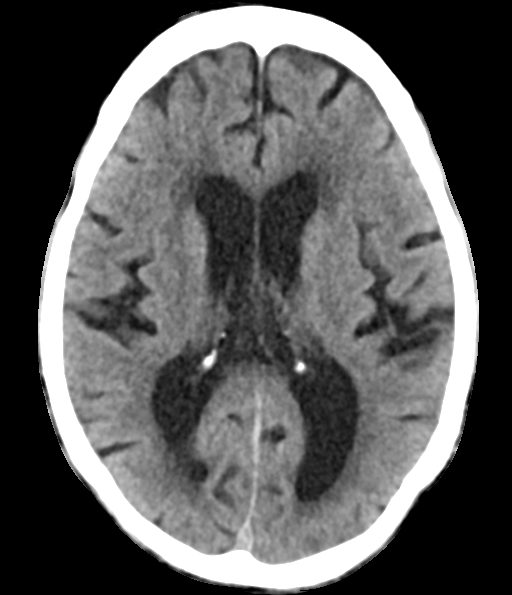
[im 18/29  brain]
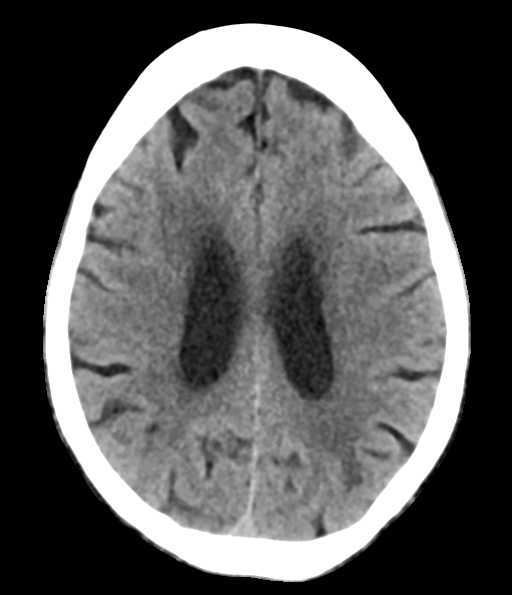
[im 18/29  bone]
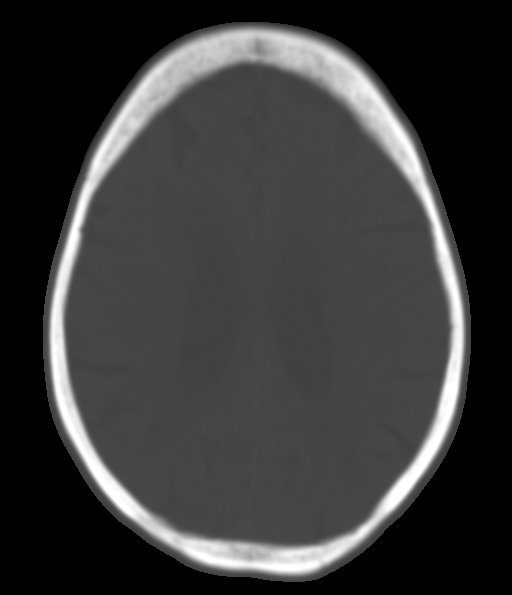
[im 22/29  brain]
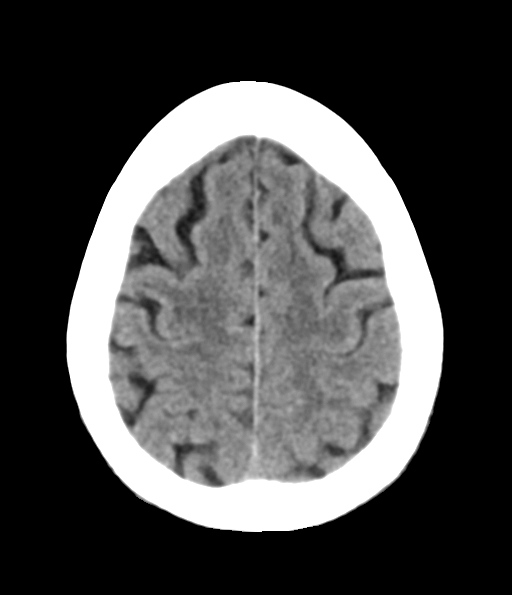
[im 25/29  brain]
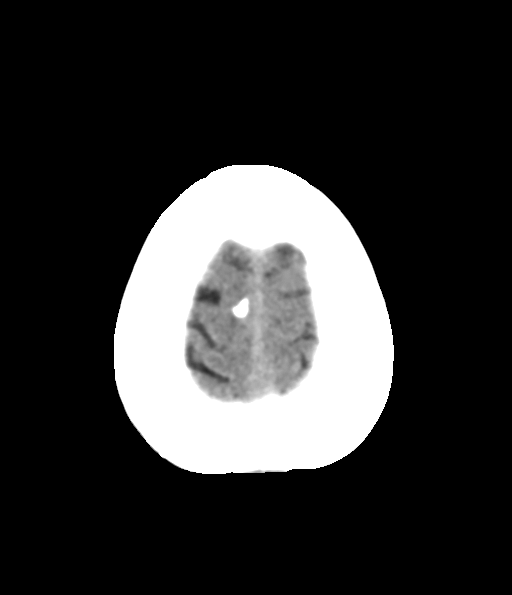

[Series 4: head bone · axial · 0.39mm/px · z∈[-118,-104]mm · 2 of 69 slices shown]
[im 7/69  bone]
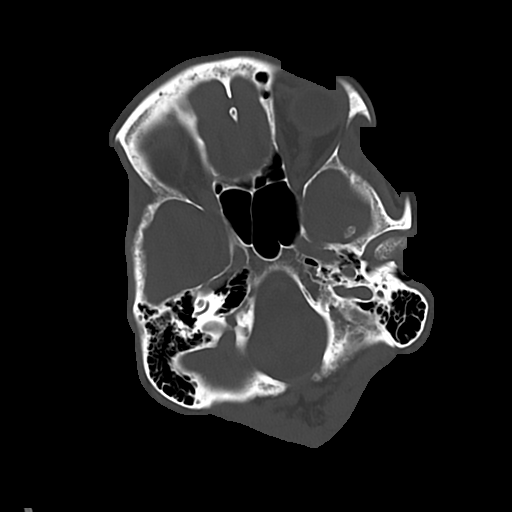
[im 14/69  bone]
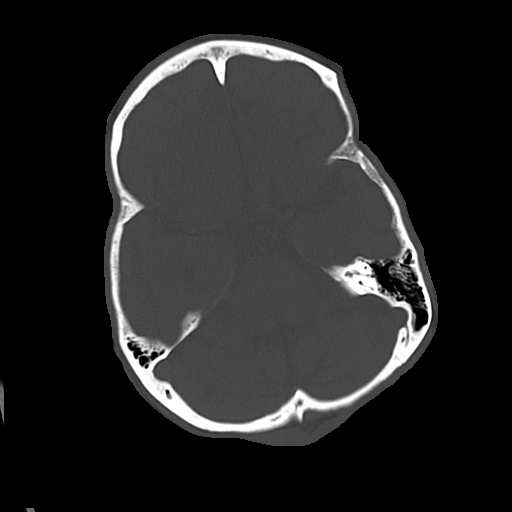

[Series 5: cor soft · coronal · 0.28mm/px · 3 of 60 slices shown]
[im 20/60  brain]
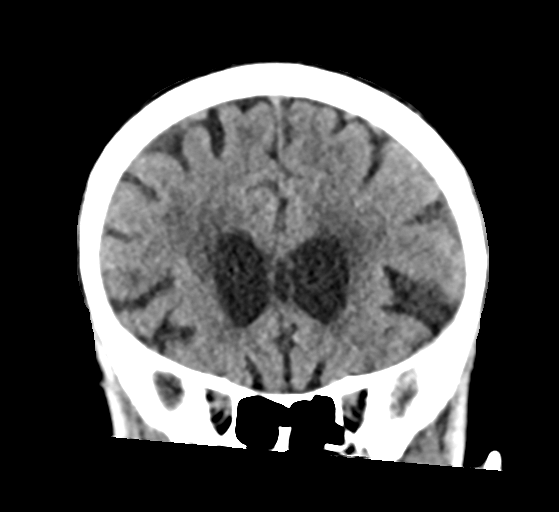
[im 27/60  brain]
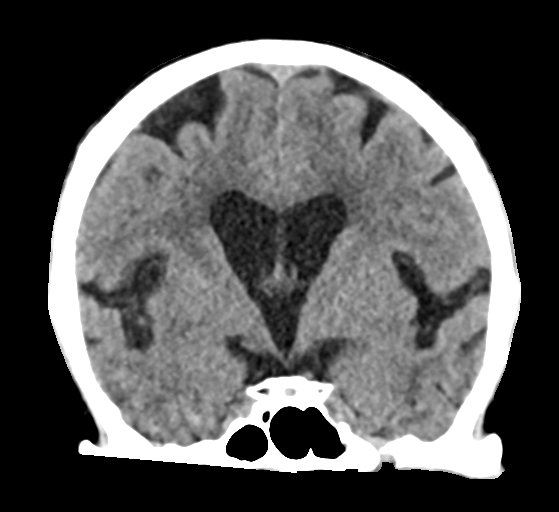
[im 33/60  brain]
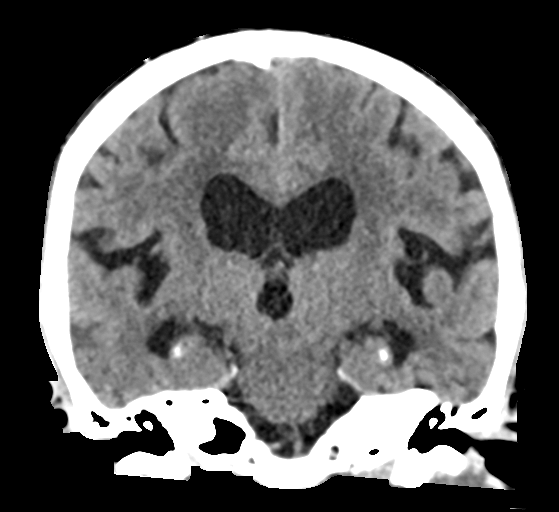

[Series 6: sag soft · sagittal · 0.28mm/px · 3 of 51 slices shown]
[im 17/51  brain]
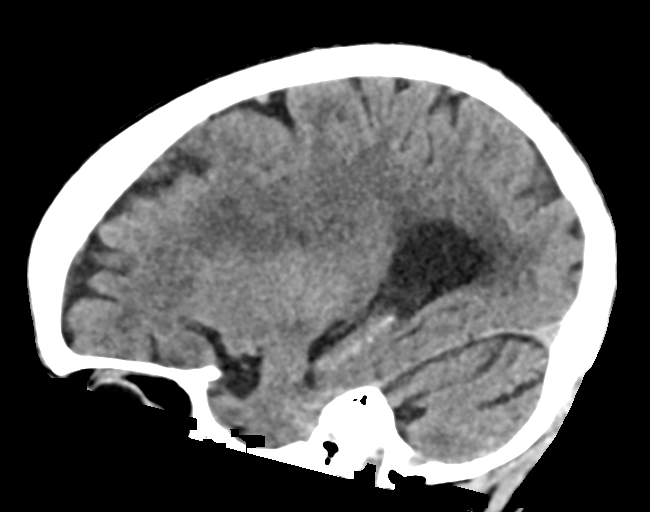
[im 26/51  brain]
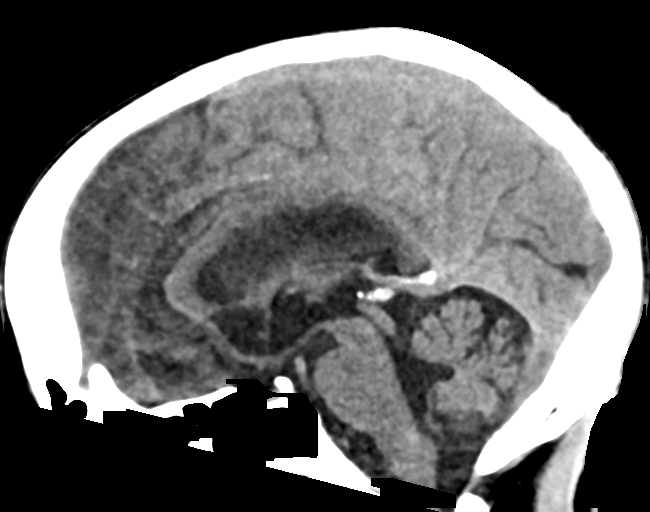
[im 34/51  brain]
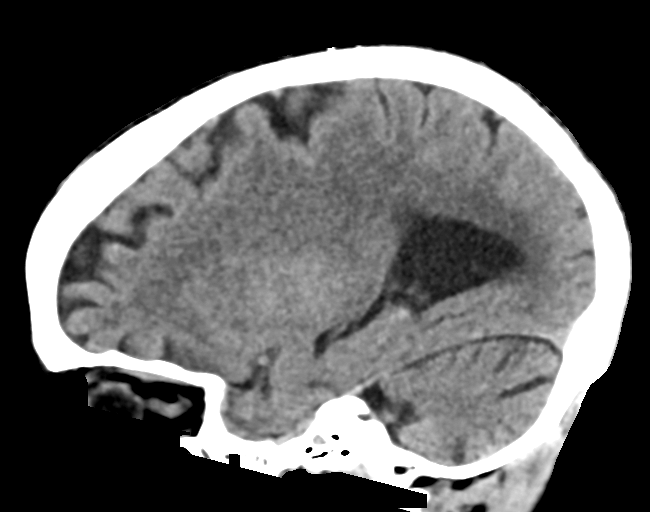

[15 of 47 positions shown; findings below may reference images not displayed]

FINDINGS: Brain: There is atrophy and chronic small vessel disease changes. No
acute intracranial abnormality. Specifically, no hemorrhage,
hydrocephalus, mass lesion, acute infarction, or significant
intracranial injury.

Vascular: No hyperdense vessel or unexpected calcification.

Skull: No acute calvarial abnormality.

Sinuses/Orbits: No acute findings

Other: None
IMPRESSION: Atrophy, chronic microvascular disease.

No acute intracranial abnormality.

## 2021-05-03 IMAGING — DX DG CHEST 1V PORT
1 series · 1 of 1 positions shown · non-contrast
Comparison: [DATE]

CLINICAL DATA: Altered mental status

EXAM:
PORTABLE CHEST 1 VIEW

[chest]
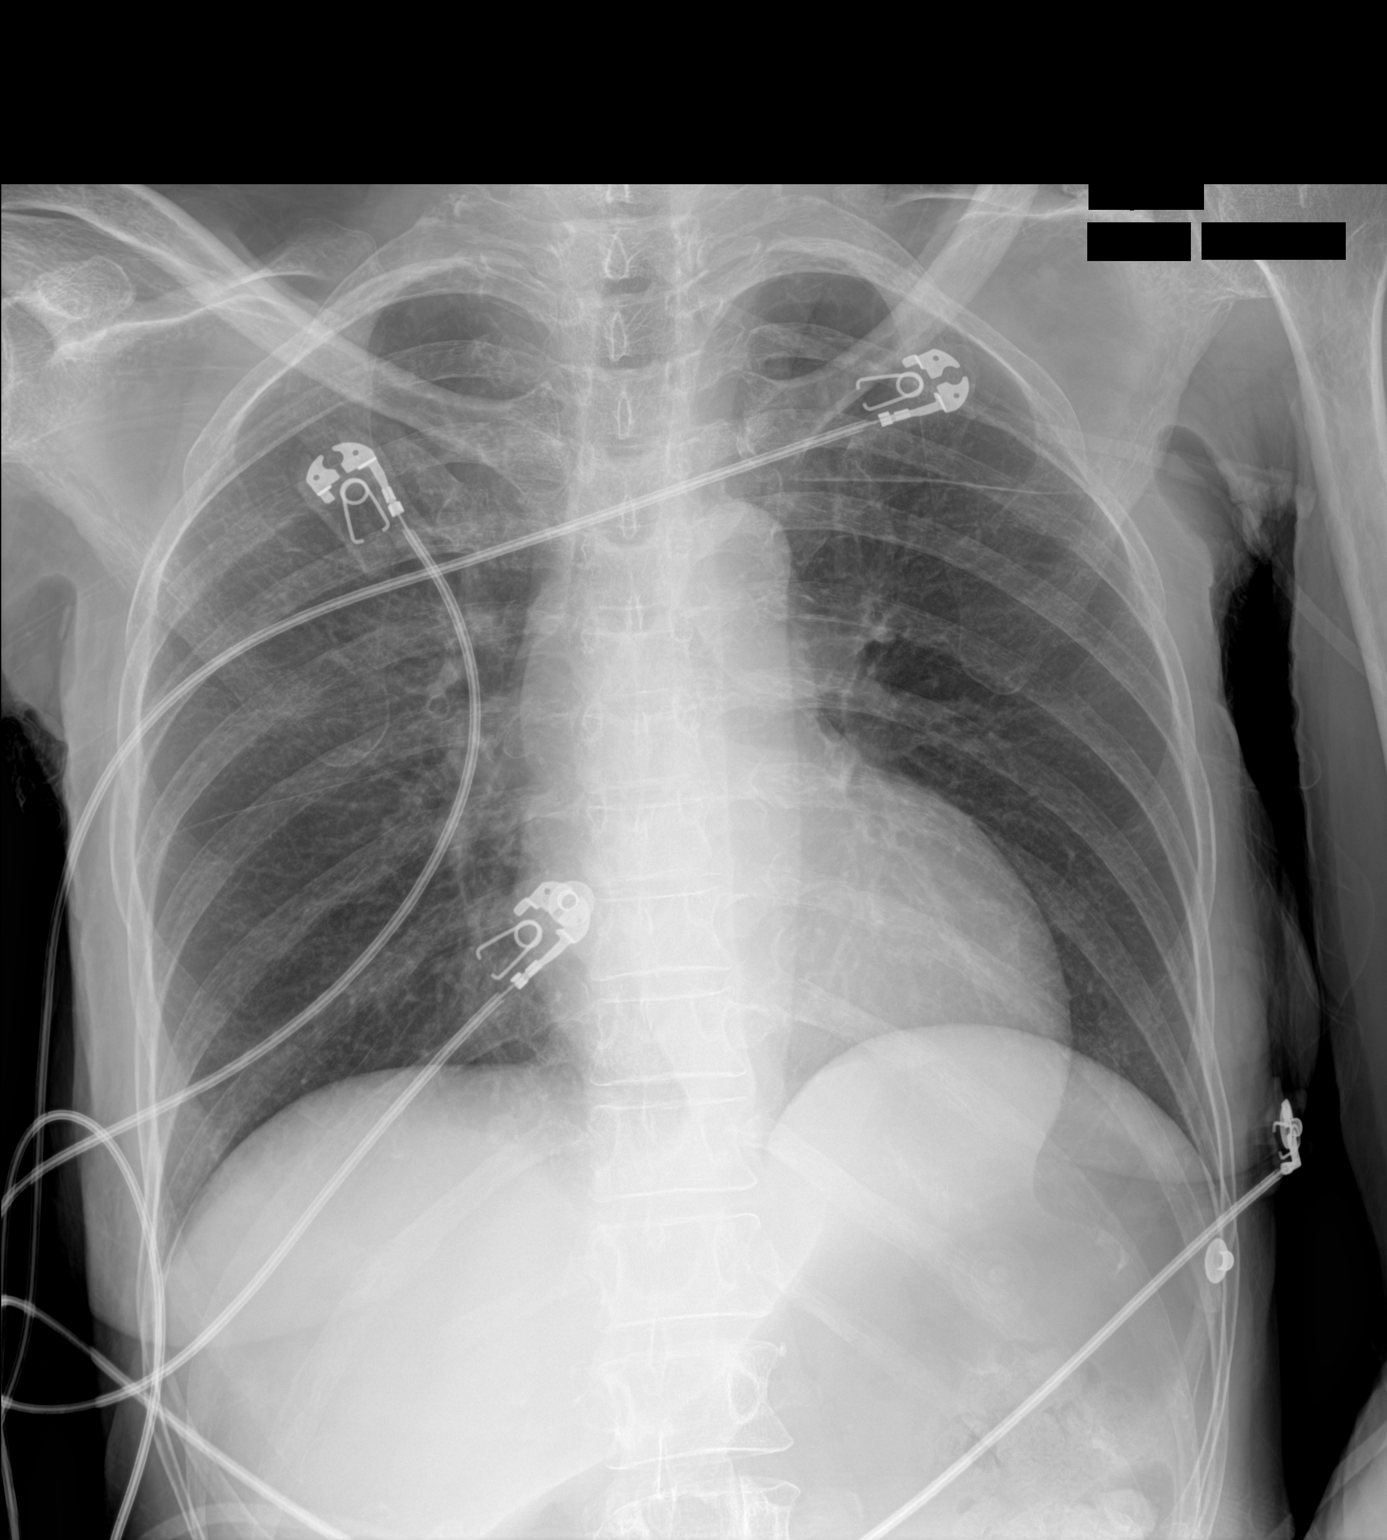

[1 of 1 positions shown; findings below may reference images not displayed]

FINDINGS: Transverse diameter of heart is in the upper limits of normal. There
are no signs of pulmonary edema or focal pulmonary consolidation.
There is no pleural effusion or pneumothorax.
IMPRESSION: No active disease is seen in the chest.

## 2021-05-03 MED ORDER — LACTATED RINGERS IV SOLN: INTRAVENOUS | Status: DC

## 2021-05-03 MED ORDER — HEPARIN SODIUM (PORCINE) 5000 UNIT/ML IJ SOLN
5000.0000 [IU] | Freq: Three times a day (TID) | INTRAMUSCULAR | Status: DC
  Administered 2021-05-03 – 2021-05-04 (×3): 5000 [IU] via SUBCUTANEOUS
  Filled 2021-05-03 (×3): qty 1

## 2021-05-03 MED ORDER — DEXTROSE 50 % IV SOLN: 0.0000 mL | INTRAVENOUS | Status: DC | PRN

## 2021-05-03 MED ORDER — LACTATED RINGERS IV BOLUS
500.0000 mL | Freq: Once | INTRAVENOUS | Status: AC
Start: 2021-05-03 — End: 2021-05-03
  Administered 2021-05-03: 20:00:00 500 mL via INTRAVENOUS

## 2021-05-03 MED ORDER — SODIUM BICARBONATE 8.4 % IV SOLN
100.0000 meq | Freq: Once | INTRAVENOUS | Status: AC
  Administered 2021-05-03: 17:00:00 100 meq via INTRAVENOUS
  Filled 2021-05-03: qty 100

## 2021-05-03 MED ORDER — LACTATED RINGERS IV BOLUS
20.0000 mL/kg | Freq: Once | INTRAVENOUS | Status: AC
  Administered 2021-05-03: 10:00:00 840 mL via INTRAVENOUS

## 2021-05-03 MED ORDER — NICOTINE 21 MG/24HR TD PT24
21.0000 mg | MEDICATED_PATCH | Freq: Every day | TRANSDERMAL | Status: DC
  Administered 2021-05-03 – 2021-05-05 (×3): 21 mg via TRANSDERMAL
  Filled 2021-05-03 (×3): qty 1

## 2021-05-03 MED ORDER — INSULIN REGULAR(HUMAN) IN NACL 100-0.9 UT/100ML-% IV SOLN
INTRAVENOUS | Status: DC
  Administered 2021-05-03: 11:00:00 5.5 [IU]/h via INTRAVENOUS
  Filled 2021-05-03: qty 100

## 2021-05-03 MED ORDER — DEXTROSE IN LACTATED RINGERS 5 % IV SOLN: INTRAVENOUS | Status: DC

## 2021-05-03 MED ORDER — ALBUTEROL SULFATE (2.5 MG/3ML) 0.083% IN NEBU: 2.5000 mg | INHALATION_SOLUTION | Freq: Four times a day (QID) | RESPIRATORY_TRACT | Status: DC | PRN

## 2021-05-03 MED ORDER — LACTATED RINGERS IV BOLUS
500.0000 mL | Freq: Once | INTRAVENOUS | Status: AC
  Administered 2021-05-03: 500 mL via INTRAVENOUS

## 2021-05-03 MED ORDER — ATORVASTATIN CALCIUM 40 MG PO TABS
40.0000 mg | ORAL_TABLET | Freq: Every evening | ORAL | Status: DC
  Administered 2021-05-03: 18:00:00 40 mg via ORAL
  Filled 2021-05-03: qty 1

## 2021-05-03 MED ORDER — ALBUTEROL SULFATE HFA 108 (90 BASE) MCG/ACT IN AERS: 2.0000 | INHALATION_SPRAY | Freq: Four times a day (QID) | RESPIRATORY_TRACT | Status: DC | PRN

## 2021-05-03 MED ORDER — CLOPIDOGREL BISULFATE 75 MG PO TABS
75.0000 mg | ORAL_TABLET | Freq: Every day | ORAL | Status: DC
  Administered 2021-05-03: 14:00:00 75 mg via ORAL
  Filled 2021-05-03: qty 1

## 2021-05-03 NOTE — Progress Notes (Signed)
Patient arrived to unit. Vital signs stable, patient alert. Patient left in lowest position with bed alarm on and call bell within reach Melony Overly, RN

## 2021-05-03 NOTE — ED Notes (Signed)
Pt transported to CT via stretcher at this time.  

## 2021-05-03 NOTE — ED Notes (Signed)
MD notified of critical ABG results.

## 2021-05-03 NOTE — Progress Notes (Signed)
Inpatient Diabetes Program Recommendations  AACE/ADA: New Consensus Statement on Inpatient Glycemic Control (2015)  Target Ranges:  Prepandial:   less than 140 mg/dL      Peak postprandial:   less than 180 mg/dL (1-2 hours)      Critically ill patients:  140 - 180 mg/dL    Latest Reference Range & Units 05/03/21 09:50  Sodium 135 - 145 mmol/L 134 (L)  Potassium 3.5 - 5.1 mmol/L 5.2 (H)  Chloride 98 - 111 mmol/L 99  CO2 22 - 32 mmol/L <7 (L)  Glucose 70 - 99 mg/dL 248 (HH)  BUN 6 - 20 mg/dL 30 (H)  Creatinine 2.50 - 1.00 mg/dL 0.37 (H)  Calcium 8.9 - 10.3 mg/dL 9.7  Anion gap 5 - 15  NOT CALCULATED    Latest Reference Range & Units 05/03/21 08:36 05/03/21 11:52 05/03/21 12:28 05/03/21 13:18 05/03/21 14:09  Glucose-Capillary 70 - 99 mg/dL 048 (HH) 889 (H)  IV insulin Drip started 1118am 448 (H) 386 (H) 322 (H)    Latest Reference Range & Units 12/26/20 09:32 02/26/21 08:36 05/03/21 12:30  Hemoglobin A1C 4.8 - 5.6 % 10.1 (H) 13.8 (H) 13.0 (H)  (326 mg/dl)    Admit with: DKA  History: Type 1 Diabetes  Home DM Meds: Lantus 8 units daily        Novolog 5 units TID with meals  Current Orders: IV Insulin Drip    Note current A1c shows extremely poor glucose control at home.  Please leave pt on the IV Insulin Drip until CO2 level is 20 or higher, Anion Gap 12 or less, and CBGs stable.  Will see pt to discuss elevated A1c and home diabetes care when appropriate    --Will follow patient during hospitalization--  Ambrose Finland RN, MSN, CDE Diabetes Coordinator Inpatient Glycemic Control Team Team Pager: [phone] (8a-5p)

## 2021-05-03 NOTE — ED Provider Notes (Signed)
[facility] EMERGENCY DEPARTMENT Provider Note   CSN: [id number] Arrival date & time: 05/03/21  0825     History Chief Complaint  Patient presents with   Altered Mental Status    Christine Benitez is a 60 y.o. female.   Altered Mental Status  Patient presented to the ED for evaluation of altered mental status.  According to the EMS report patient's had altered mental status and vomiting.  She did have a fall yesterday when she hit her head.  Prior records reviewed indicate the patient does have history of DKA.  She is also had sepsis and hypoglycemia.  In the emergency room the patient is looking at me but she is not answering any questions.  Unable to obtain additional history from the patient at this time  Past Medical History:  Diagnosis Date   Abnormal LFTs    a. 05/2014 -  ALT 52, alk phos 130.   Chronic systolic CHF (congestive heart failure) ([place])    a. Dx 05/2014 - echo at [facility] - EF 30-35%, moderate LVH, no rWMA, mild to moderate MR, moderate PH with PA pressure 60-67mmHg, mild to moderate pericardial effusion. EF 30-35% by cath.   History of blood transfusion 1986   "related to C-section"   Hypertension    Insulin dependent diabetes mellitus    Iron deficiency anemia    NICM (nonischemic cardiomyopathy) ([place])    a. [place] 06/11/14:  minor nonobstructive CAD with mild irregularities in the LAD less than 10-20%, LCx with mild disease less than 20%, no significant disease in the RCA, EF 30-35%.   Pericardial effusion    a. Mild-moderate pericardial effusion by echo at [facility].   Pulmonary hypertension ([place])    a. Moderate PH by echo at [facility].   Tobacco abuse     Patient Active Problem List   Diagnosis Date Noted   Encephalopathy acute    Metabolic acidosis    Nausea & vomiting 12/26/2020   Dehydration 12/26/2020   Chronic diastolic CHF (congestive heart failure) ([place]) 12/26/2020   DKA (diabetic ketoacidosis) ([place]) 12/25/2020   Severe sepsis ([place])  11/25/2020   Sepsis due to pneumonia ([place])    Hyperkalemia 11/11/2020   Constipation 11/03/2020   Cachexia ([place]) 11/03/2020   Malnutrition of moderate degree 11/03/2020   Hypoglycemia 11/02/2020   Proteus mirabilis infection 10/25/2020   Acute lower UTI 10/24/2020   Urinary tract infection 10/24/2020   Failure to thrive in adult 10/24/2020   Pericardial effusion    Iron deficiency anemia    Chronic systolic CHF (congestive heart failure) ([place])    Heart failure with reduced ejection fraction ([place]) 07/18/2020   History of CVA (cerebrovascular accident) 07/18/2020   Nonischemic cardiomyopathy ([place]) 07/18/2020   Type 2 diabetes mellitus with complication, with long-term current use of insulin ([place]) 07/18/2020   Tobacco use 07/18/2020   Mixed hyperlipidemia 07/18/2020   Abnormal echocardiogram 07/18/2020   Resides in long term care facility 06/20/2020   Protein-calorie malnutrition, severe 03/24/2020   Pain    Altered mental status    DNR (do not resuscitate)    DNI (do not intubate)    Encounter for hospice care discussion    Goals of care, counseling/discussion    Palliative care by specialist    AKI (acute kidney injury) ([place])    Cardiogenic shock ([name]) 03/14/2020   Acute urinary retention 03/13/2020   Chronic cholecystitis with calculus 03/12/2020   Cerebral thrombosis with cerebral infarction 03/12/2020   Abnormal LFTs  Hypertension    NICM (nonischemic cardiomyopathy) ([place])    Pulmonary hypertension ([place])    Tobacco abuse 06/10/2014   Diabetes mellitus ([place]) 06/10/2014   Family history of premature CAD 06/10/2014   History of blood transfusion 1986    Past Surgical History:  Procedure Laterality Date   APPENDECTOMY  ~ 2003   [facility]   LEFT HEART CATHETERIZATION WITH CORONARY ANGIOGRAM N/A 06/11/2014   Procedure: LEFT HEART CATHETERIZATION WITH CORONARY ANGIOGRAM;  Surgeon: Peter M Martinique, MD;  Location: [facility] CATH LAB;  Service: Cardiovascular;   Laterality: N/A;     OB History   No obstetric history on file.     Family History  Problem Relation Age of Onset   Coronary artery disease Mother    Diabetes Mother    Lupus Mother    Stroke Mother    Lung cancer Father     Social History   Tobacco Use   Smoking status: Former    Packs/day: 0.50    Years: 22.00    Pack years: 11.00    Types: Cigarettes    Quit date: 03/26/2015    Years since quitting: 6.1   Smokeless tobacco: Never  Vaping Use   Vaping Use: Never used  Substance Use Topics   Alcohol use: No   Drug use: No    Home Medications Prior to Admission medications   Medication Sig Start Date End Date Taking? Authorizing Provider  acetaminophen (TYLENOL) 650 MG CR tablet Take 1,300 mg by mouth every 8 (eight) hours as needed for pain.    [provider]  albuterol (VENTOLIN HFA) 108 (90 Base) MCG/ACT inhaler Inhale 2 puffs into the lungs every 6 (six) hours as needed for wheezing or shortness of breath. 10/28/20   Allie Bossier, MD  atorvastatin (LIPITOR) 40 MG tablet Take 1 tablet (40 mg total) by mouth every evening. Patient not taking: Reported on 02/23/2021 03/26/20   Sanjuan Dame, MD  clopidogrel (PLAVIX) 75 MG tablet Take 1 tablet (75 mg total) by mouth daily. 10/28/20 10/28/21  Allie Bossier, MD  insulin aspart (NOVOLOG FLEXPEN) 100 UNIT/ML FlexPen Inject 5 Units into the skin 3 (three) times daily with meals. 12/03/20   Antonieta Pert, MD  insulin glargine (LANTUS) 100 UNIT/ML Solostar Pen Inject 8 Units into the skin daily. 03/04/21   Bonnielee Haff, MD  Insulin Pen Needle (PENTIPS) 32G X 4 MM MISC Use as directed 11/28/20   Kayleen Memos, DO  neomycin-bacitracin-polymyxin (NEOSPORIN) OINT Apply 1 application topically 2 (two) times daily. To wound in the groin 03/04/21   Bonnielee Haff, MD  insulin aspart protamine - aspart (NOVOLOG 70/30 MIX) (70-30) 100 UNIT/ML FlexPen Inject 0.04 mLs (4 Units total) into the skin 2 (two) times daily with  a meal. 11/28/20 12/01/20  Kayleen Memos, DO    Allergies    Patient has no known allergies.  Review of Systems   Review of Systems  Unable to perform ROS: Mental status change   Physical Exam Updated Vital Signs BP 133/71    Pulse (!) 118    Temp (!) 96.9 F (36.1 C) (Axillary)    Resp (!) 21    Wt 42 kg    SpO2 100%    BMI 15.41 kg/m   Physical Exam Vitals and nursing note reviewed.  Constitutional:      Appearance: She is well-developed. She is ill-appearing.     Comments: Frail, underweight  HENT:     Head:  Normocephalic and atraumatic.     Right Ear: External ear normal.     Left Ear: External ear normal.     Mouth/Throat:     Mouth: Mucous membranes are dry.  Eyes:     General: No scleral icterus.       Right eye: No discharge.        Left eye: No discharge.     Conjunctiva/sclera: Conjunctivae normal.  Neck:     Trachea: No tracheal deviation.  Cardiovascular:     Rate and Rhythm: Regular rhythm. Tachycardia present.  Pulmonary:     Effort: Pulmonary effort is normal. No respiratory distress.     Breath sounds: Normal breath sounds. No stridor. No wheezing or rales.  Abdominal:     General: Bowel sounds are normal. There is no distension.     Palpations: Abdomen is soft.     Tenderness: There is no abdominal tenderness. There is no guarding or rebound.  Musculoskeletal:        General: No tenderness or deformity.     Cervical back: Neck supple.     Comments: No tenderness palpation extremities, no deformities noted  Skin:    General: Skin is warm and dry.     Findings: No rash.  Neurological:     Cranial Nerves: No cranial nerve deficit (no facial droop,  ,).     Motor: No abnormal muscle tone or seizure activity.     Comments: Patient looks at me as I speak to her, she does track across the midline, does move her extremities slightly but does not follow commands  Psychiatric:        Mood and Affect: Mood normal.    ED Results / Procedures / Treatments    Labs (all labs ordered are listed, but only abnormal results are displayed) Labs Reviewed  BASIC METABOLIC PANEL - Abnormal; Notable for the following components:      Result Value   Sodium 134 (*)    Potassium 5.2 (*)    CO2 <7 (*)    Glucose, Bld 572 (*)    BUN 30 (*)    Creatinine, Ser 1.94 (*)    GFR, Estimated 29 (*)    All other components within normal limits  OSMOLALITY - Abnormal; Notable for the following components:   Osmolality 338 (*)    All other components within normal limits  CBC WITH DIFFERENTIAL/PLATELET - Abnormal; Notable for the following components:   WBC 19.0 (*)    MCV 101.3 (*)    Neutro Abs 17.4 (*)    Lymphs Abs 0.6 (*)    Abs Immature Granulocytes 0.08 (*)    All other components within normal limits  AMMONIA - Abnormal; Notable for the following components:   Ammonia 38 (*)    All other components within normal limits  CBG MONITORING, ED - Abnormal; Notable for the following components:   Glucose-Capillary 531 (*)    All other components within normal limits  RESP PANEL BY RT-PCR (FLU A&B, COVID) ARPGX2  BASIC METABOLIC PANEL  BASIC METABOLIC PANEL  BASIC METABOLIC PANEL  URINALYSIS, ROUTINE W REFLEX MICROSCOPIC  CBG MONITORING, ED    EKG EKG Interpretation  Date/Time:  Sunday May 03 2021 08:42:47 EST Ventricular Rate:  122 PR Interval:  153 QRS Duration: 87 QT Interval:  307 QTC Calculation: 438 R Axis:   63 Text Interpretation: Sinus tachycardia Consider right atrial enlargement Probable LVH with secondary repol abnrm No significant change since last tracing Confirmed  by Dorie Rank [phone]) on 05/03/2021 9:14:44 AM  Radiology CT Head Wo Contrast  Result Date: 05/03/2021 CLINICAL DATA:  Altered mental status EXAM: CT HEAD WITHOUT CONTRAST TECHNIQUE: Contiguous axial images were obtained from the base of the skull through the vertex without intravenous contrast. COMPARISON:  02/23/2021 FINDINGS: Brain: There is atrophy and  chronic small vessel disease changes. No acute intracranial abnormality. Specifically, no hemorrhage, hydrocephalus, mass lesion, acute infarction, or significant intracranial injury. Vascular: No hyperdense vessel or unexpected calcification. Skull: No acute calvarial abnormality. Sinuses/Orbits: No acute findings Other: None IMPRESSION: Atrophy, chronic microvascular disease. No acute intracranial abnormality. Electronically Signed   By: Rolm Baptise M.D.   On: 05/03/2021 10:37   DG Chest Portable 1 View  Result Date: 05/03/2021 CLINICAL DATA:  Altered mental status EXAM: PORTABLE CHEST 1 VIEW COMPARISON:  02/24/2021 FINDINGS: Transverse diameter of heart is in the upper limits of normal. There are no signs of pulmonary edema or focal pulmonary consolidation. There is no pleural effusion or pneumothorax. IMPRESSION: No active disease is seen in the chest. Electronically Signed   By: Elmer Picker M.D.   On: 05/03/2021 09:27    Procedures .Critical Care Performed by: Dorie Rank, MD Authorized by: Dorie Rank, MD   Critical care provider statement:    Critical care time (minutes):  30   Critical care was time spent personally by me on the following activities:  Development of treatment plan with patient or surrogate, discussions with consultants, evaluation of patient's response to treatment, examination of patient, ordering and review of laboratory studies, ordering and review of radiographic studies, ordering and performing treatments and interventions, pulse oximetry, re-evaluation of patient's condition and review of old charts   Medications Ordered in ED Medications  insulin regular, human (MYXREDLIN) 100 units/ 100 mL infusion (5.5 Units/hr Intravenous New Bag/Given 05/03/21 1118)  lactated ringers infusion ( Intravenous New Bag/Given 05/03/21 1120)  dextrose 5 % in lactated ringers infusion (has no administration in time range)  dextrose 50 % solution 0-50 mL (has no administration  in time range)  lactated ringers bolus 840 mL (0 mLs Intravenous Stopped 05/03/21 1120)    ED Course  I have reviewed the triage vital signs and the nursing notes.  Pertinent labs & imaging results that were available during my care of the patient were reviewed by me and considered in my medical decision making (see chart for details).  Clinical Course as of 05/03/21 1123  Sun May 03, 2021  0900 Pt now awake, yelling asking for water [JK]  6389 Metabolic panel shows bicarb down to 7.  Glucose is elevated at 572.  Presentation consistent with diabetic ketoacidosis. [HT]  3428 Head CT without acute findings. [JG]  8115 Chest x-ray without acute findings. [JK]    Clinical Course User Index [JK] Dorie Rank, MD   MDM Rules/Calculators/A&P                         Patient presented to the ED for evaluation of altered mental status and hyperglycemia.  Patient's mental status did improve with hydration.  She complained of creased thirst.  Patient's laboratory tests showed elevated osmolality as well as hyperglycemia and an anion gap metabolic acidosis.  Patient's COVID and flu are negative.  No signs of pneumonia.  Precipitating factor for her DKA unclear but could be related to medical noncompliance.  Urinalysis pending to assess for urinary tract infection.  No fever or signs of sepsis at this  time.  We will continue to monitor closely.  Continue IV infusion of insulin.  Need to monitor electrolytes closely.  I will consult the medical service for admission.    Final Clinical Impression(s) / ED Diagnoses Final diagnoses:  Diabetic ketoacidosis without coma associated with type 1 diabetes mellitus ([place])     Dorie Rank, MD 05/03/21 1123

## 2021-05-03 NOTE — ED Triage Notes (Signed)
Pt BIB [facility] from home C/O AMS and vomiting.  Per EMS, pt fell yesterday and hit head, but did not get checked out. Pt is on blood thinners.   CBG 412 144/77 HR 121 96% 4 Zofran, 750 mL NS  20G LEJ

## 2021-05-03 NOTE — ED Notes (Signed)
RT called to recollect ABG.

## 2021-05-03 NOTE — ED Notes (Signed)
EJ has blood return, this RN attempted [place] IV x2 unsuccessful, clarified if okay to use EJ with Dr Lynelle Doctor, he says okay to use for bolus while we wait for IV team assistance.

## 2021-05-03 NOTE — Progress Notes (Signed)
2109: Critical lab rec'd: Lactic Acid=3.6. Provider aware. Will continue to monitor.

## 2021-05-03 NOTE — Evaluation (Signed)
Clinical/Bedside Swallow Evaluation Patient Details  Name: Christine Benitez MRN: [id number] Date of Birth: 03-Dec-1960  Today's Date: 05/03/2021 Time: SLP Start Time (ACUTE ONLY): 32 SLP Stop Time (ACUTE ONLY): 1636 SLP Time Calculation (min) (ACUTE ONLY): 24 min  Past Medical History:  Past Medical History:  Diagnosis Date   Abnormal LFTs    a. 05/2014 -  ALT 52, alk phos 130.   Chronic systolic CHF (congestive heart failure) ([place])    a. Dx 05/2014 - echo at [facility] - EF 30-35%, moderate LVH, no rWMA, mild to moderate MR, moderate PH with PA pressure 60-26mHg, mild to moderate pericardial effusion. EF 30-35% by cath.   History of blood transfusion 1986   "related to C-section"   Hypertension    Insulin dependent diabetes mellitus    Iron deficiency anemia    NICM (nonischemic cardiomyopathy) ([place]    [place]:  minor nonobstructive CAD with mild irregularities in the LAD less than 10-20%, LCx with mild disease less than 20%, no significant disease in the RCA, EF 30-35%.   Pericardial effusion    a. [facility]   Pulmonary hypertension ([place]    [facility]   Tobacco abuse    Past Surgical History:  Past Surgical History:  Procedure Laterality Date   APP[place]CTOMY  ~ 2003   [place]   Procedure: LEFT HEART CATHETERIZATION WITH CORONARY ANGIOGRAM;  Surgeon: Peter M JMartinique MD;  [facility];  Service: Cardiovascular;  Laterality: N/A;   HPI:  Christine COUZENSis a 60yo female with nonischemic cardiomyopathy, HFrEF (EF 45-50%), HTN, and T1DM (previous hx of DKA). She presented to the ED with AMS - staring straight ahead and hyperglycemic. Per EMS, the patient sustained a fall yesterday where she hit may have her head.  CT head and CXR negative. Swallow eval ordered.    Assessment / Plan / Recommendation  Clinical  Impression  Oral mechanism exam was limited due to pt's difficulty following commands nor attempt to help self feed.  She was able to seal lips on straw and spoon and no obvious focal CN deficits noted.   She demonstrated prolonged mastication secondary to edentulous status but also ? AMS impact.  Use of yogurt helpful to aid oral solid clearance.  Multiple swallows noted with yogurt along with minimal anterior exposure of tongue *? lingual pumping.  Pt verbalized what she wanted to SLP but does not answer SLP questions re: swallowing.  She read SlP name tag and asked to be covered however.  Recommend consider liquid vs puree diet and SLP will follow up for dyspahgia management, readiness for dietary advancement.  Recommend esophageal precautions due to pt's eructation and her vomiting PTA- ? GI issues from DM.  Anticipate that as pt medicall improves, her possible mild oral dysphagia will resolve and she will be appropriate for clinical advancement.  Informed RN to recommendations and messaged referring MD.  Thanks. (Oral mechanism exam was limited due to pt's difficulty following commands; pt able to seal lips on starw.  She demonstrated prolonged mastication secondary to edentulous status but also ? AMS impact.  Use of yogurt helpful to aid oral solid clearance.) SLP Visit Diagnosis: Dysphagia, unspecified (R13.10)    Aspiration Risk  Mild aspiration risk    Diet Recommendation Thin liquid (clears, fulls, puree- defer to md)   Liquid Administration  via: Straw Medication Administration: Whole meds with puree Supervision: Staff to assist with self feeding Compensations: Slow rate;Small sips/bites Postural Changes: Seated upright at 90 degrees;Remain upright for at least 30 minutes after po intake    Other  Recommendations Oral Care Recommendations: Oral care BID    Recommendations for follow up therapy are one component of a multi-disciplinary discharge planning process, led by the attending  physician.  Recommendations may be updated based on patient status, additional functional criteria and insurance authorization.  Follow up Recommendations Follow physician's recommendations for discharge plan and follow up therapies  TBD    Assistance Recommended at Discharge Frequent or constant Supervision/AssistanceTBD  Functional Status Assessment Patient has had a recent decline in their functional status and demonstrates the ability to make significant improvements in function in a reasonable and predictable amount of time.  Frequency and Duration min 1 x/week  1 week       Prognosis Prognosis for Safe Diet Advancement: Good      Swallow Study   General Date of Onset: 05/03/21 HPI: [name] is a 60 yo female with nonischemic cardiomyopathy, HFrEF (EF 45-50%), HTN, and T1DM (previous hx of DKA). She presented to the ED with AMS - staring straight ahead and hyperglycemic. Per EMS, the patient sustained a fall yesterday where she hit may have her head.  CT head and CXR negative. Swallow eval ordered. Type of Study: Bedside Swallow Evaluation Previous Swallow Assessment: prior MBS 2022 11/2020 pt was edentulous, overall functional swallow - regular/thin diet Diet Prior to this Study: NPO Temperature Spikes Noted: No Respiratory Status: Room air History of Recent Intubation: No Behavior/Cognition: Alert;Doesn't follow directions Oral Cavity Assessment: Within Functional Limits Oral Care Completed by SLP: No (pt only wanted to drink) Oral Cavity - Dentition: Edentulous Vision: Impaired for self-feeding Self-Feeding Abilities: Total assist Patient Positioning: Upright in bed Baseline Vocal Quality: Normal Volitional Cough: Cognitively unable to elicit Volitional Swallow: Unable to elicit    Oral/Motor/Sensory Function Overall Oral Motor/Sensory Function:  (pt did not follow directions, able to seal lips on straw, no overt indication of cranial nerve deficits)   Ice Chips  Ice chips: Not tested   Thin Liquid Thin Liquid: Within functional limits Presentation: Straw Other Comments: 3 ounce [place]    Nectar Thick Nectar Thick Liquid: Within functional limits Presentation: Straw Other Comments: entire Glucerna   Honey Thick Honey Thick Liquid: Not tested   Puree Puree: Impaired Presentation: Spoon Oral Phase Impairments: Reduced lingual movement/coordination;Other (comment) (minimal lingual protrusion) Oral Phase Functional Implications: Other (comment) Pharyngeal Phase Impairments: Multiple swallows   Solid     Solid: Impaired Oral Phase Impairments: Impaired mastication Oral Phase Functional Implications: Impaired mastication;Other (comment) Other Comments: prolonged mastication with bolus initially in left sulci - moved to middle - and eventually SLP provided pt with yogurt to aid oral transiting      Macario Golds 05/03/2021,5:04 PM   Kathleen Lime, MS [place] Office [phone] Pager [phone]

## 2021-05-03 NOTE — ED Notes (Signed)
Dr. Lynelle Doctor aware of critical glucose.

## 2021-05-03 NOTE — H&P (Signed)
Date: 05/03/2021               Patient Name:  Christine Benitez MRN: [id number]  DOB: 10/11/1960 Age / Sex: 60 y.o., female   PCP: Claiborne Billings, MD         Medical Service: Internal Medicine Teaching Service         Attending Physician: Dr. Jimmye Norman, Christine Pattee, MD    First Contact: Dr. Raymondo Band Pager: [phone]  Second Contact: Dr. Lisabeth Devoid Pager: [phone]       After Hours (After 5p/  First Contact Pager: [phone]  weekends / holidays): Second Contact Pager: [phone]   Chief Complaint: AMS, hyperglycemia  History of Present Illness: Christine Benitez is a 60 yo female with nonischemic cardiomyopathy, HFrEF (EF 45-50%), HTN, and T1DM (previous hx of DKA). She presented to the ED with AMS and hyperglycemia. Per EMS, the patient sustained a fall yesterday where she hit may have her head. Upon presentation to the ED, she was altered to the point that she could not answer any questions. She is not verbally interactive with our team upon questioning, and only is able to repeatedly state "I want water". She is also not able to follow any commands at this time, thus history was obtained from chart review, and from the patient's mother, Christine Benitez, and her friend, Christine Benitez, who were both called. According to [place], the patient sustained a fall yesterday, but she does not believe she hit her head. She states that the patient hit her nose and was a little confused after her fall, but she was not able to specify in what ways she was confused. The patient went to sleep that night and woke up around 4 am with multiple episodes of vomiting and was unable to keep any food/water down. Christine Benitez noted the patient to be confused at this time and stated that she just kept "looking all around" and wouldn't make eye contact with her. Christine Benitez then called the patient's mother, Christine Benitez, who then advised her to call EMS.   Of note, patient was admitted to the ICU in October 2022 for DKA.   ED course: Glucose elevated 572,  bicarb <7, and anion gap >20. K elevated to 5.2. Osmolality high, 338. Leukocytosis to 19 and Hb within normal limits. Started on IVF and insulin drip.  CT head with no acute intracranial abnormality, only noted atrophy and chronic microvascular disease. CXR with no active disease.   Meds:  Lantus 8u daily Novolog 5u tid with meals Plavix 75 mg  Lipitor 40 mg Albuterol prn No outpatient medications have been marked as taking for the 05/03/21 encounter [facility] Encounter).     Allergies: Allergies as of 05/03/2021   (No Known Allergies)   Past Medical History:  Diagnosis Date   Abnormal LFTs    a. 05/2014 -  ALT 52, alk phos 130.   Chronic systolic CHF (congestive heart failure) ([place])    a. Dx 05/2014 - echo at [facility] - EF 30-35%, moderate LVH, no rWMA, mild to moderate MR, moderate PH with PA pressure 60-15mmHg, mild to moderate pericardial effusion. EF 30-35% by cath.   History of blood transfusion 1986   "related to C-section"   Hypertension    Insulin dependent diabetes mellitus    Iron deficiency anemia    NICM (nonischemic cardiomyopathy) ([place])    a. [place] 06/11/14:  minor nonobstructive CAD with mild irregularities in the LAD less than 10-20%, LCx with mild disease less than 20%, no  significant disease in the RCA, EF 30-35%.   Pericardial effusion    a. Mild-moderate pericardial effusion by echo at [facility].   Pulmonary hypertension ([place])    a. Moderate PH by echo at [facility].   Tobacco abuse     Family History: CAD, diabetes, stroke Hx of lung cancer in father  Social History: Per Risk manager (friend): No alcohol or illicit drug use. Smokes about 1 pack cigarettes/day. Lives with her friend, Christine Benitez. Previously lived with daughter, although daughter passed away 1 week ago.  Unsure of PCP.   Review of Systems: A complete ROS was negative except as per HPI.   Physical Exam: Blood pressure 126/71, pulse (!) 113, temperature (!) 96.9 F (36.1 C), temperature source  Axillary, resp. rate 19, weight 42 kg, SpO2 100 %.  General: Frail/cachectic, ill appearing female laying in bed. No acute distress. Head: Normocephalic. Atraumatic. HENT: Nose with small laceration. PERRL. Unable to assess EOM. Dry mucous membranes.  CV: Tachycardic rate, regular rhythm. +Murmur. No LE edema Pulmonary: Lungs CTAB. Normal effort. No wheezing or rales. Protecting airway.  Abdominal: Soft, nontender, nondistended. Normal bowel sounds. Extremities: Palpable radial and DP pulses. Skin: Warm and dry. Bilateral ankles with peeling skin, no ulcerations noted.  Neuro: A&Ox 0. Unable to follow any commands. Patient repeatedly stating "give me some water" but unable to converse with me beyond that. Opens eyes to voice and will respond to painful stimuli, but not verbally interactive beyond that.  Psych: Unable to assess   EKG: personally reviewed my interpretation is sinus tachycardia with no significant changes compared to prior EKG  CXR: personally reviewed my interpretation is no acute cardiopulmonary process  Assessment & Plan by Problem: Principal Problem:   Acute metabolic encephalopathy  #Acute metabolic encephalopathy likely 2/2 to DKA #Hx of T1DM Patient presented with AMS and is A&Ox 0 on exam and unable to follow commands. BG 572 with bicarb <7 and elevated anion gap upon admission. Urine osmolality also elevated to 338. Patient reportedly taking insulin at home, however, her friend is unsure how reliably she takes this, as the patient's cousin usually has to administer her insulin. The patient did sustain a fall yesterday, however, CT head was negative for any acute process.  - MRI to rule out stroke - Urinalysis  - Beta hydroxybutyrate and ABG - Blood cultures - A1c - Continue insulin infusion until anion gap closed x2 and patient able to tolerate po intake - NPO, SLP eval and treat  #Chronic heart failure 2/2 NICM #HTN #Hx of CVA Last EF 45-50% in October with  LV global hypokinesis and severe LVH. Reportedly takes clopidogrel 75 mg at home, although not on any other medications for heart failure or hypertension .  - Cardiac monitoring  #Hyperkalemia K 5.2 on admission. On insulin drip for DKA which will likely improve potassium level. - Recheck BMP`  #Severe protein-calorie malnutrition BMI 15.4, patient is very cachectic and frail appearing. During last hospitalization in October, was receiving tube feeds.  #Tobacco use disorder Smokes 1 ppd for ~40 years. - Nicotine patch ordered  #HLD - Resumed home lipitor 40 mg   #Possible history of urinary retention - Obtain bladder scan - Urinalysis to be collected  #Goals of care Patient has been introduced to palliative care and has established with them as an outpatient. Patient wanted to trial rehabilitation after her last admission, although it seems that hospice was also being considered for the near future. - Palliative care consulted for updated goals of  care discussion, appreciate assistance    Best practices: Code: DNR, need to readdress VTE: Heparin  Diet: NPO Family contact: Mother called and notified Fluids: LR 125 cc/hr, will switch to D5 LR 125 cc/hr when CBG<250  Dispo: Admit patient to Inpatient with expected length of stay greater than 2 midnights.  SignedDorethea Clan, DO 05/03/2021, 12:31 PM  Pager: [phone] After 5pm on weekdays and 1pm on weekends: On Call pager: [phone]

## 2021-05-03 NOTE — Progress Notes (Signed)
Nurse received critical Lactic acid from lab. Night time IM resident paged.  Melony Overly, RN

## 2021-05-04 ENCOUNTER — Inpatient Hospital Stay (HOSPITAL_COMMUNITY): Payer: Medicaid Other

## 2021-05-04 DIAGNOSIS — Z66 Do not resuscitate: Secondary | ICD-10-CM

## 2021-05-04 DIAGNOSIS — Z515 Encounter for palliative care: Secondary | ICD-10-CM

## 2021-05-04 DIAGNOSIS — Z7189 Other specified counseling: Secondary | ICD-10-CM

## 2021-05-04 LAB — CBC
HCT: 30.3 % — ABNORMAL LOW (ref 36.0–46.0)
Hemoglobin: 10.5 g/dL — ABNORMAL LOW (ref 12.0–15.0)
MCH: 31.7 pg (ref 26.0–34.0)
MCHC: 34.7 g/dL (ref 30.0–36.0)
MCV: 91.5 fL (ref 80.0–100.0)
Platelets: 221 10*3/uL (ref 150–400)
RBC: 3.31 MIL/uL — ABNORMAL LOW (ref 3.87–5.11)
RDW: 13.2 % (ref 11.5–15.5)
WBC: 10.5 10*3/uL (ref 4.0–10.5)
nRBC: 0 % (ref 0.0–0.2)

## 2021-05-04 LAB — BASIC METABOLIC PANEL
Anion gap: 11 (ref 5–15)
Anion gap: 8 (ref 5–15)
BUN: 17 mg/dL (ref 6–20)
BUN: 13 mg/dL (ref 6–20)
CO2: 24 mmol/L (ref 22–32)
CO2: 27 mmol/L (ref 22–32)
Calcium: 9.5 mg/dL (ref 8.9–10.3)
Calcium: 9 mg/dL (ref 8.9–10.3)
Chloride: 103 mmol/L (ref 98–111)
Chloride: 101 mmol/L (ref 98–111)
Creatinine, Ser: 1.01 mg/dL — ABNORMAL HIGH (ref 0.44–1.00)
Creatinine, Ser: 0.94 mg/dL (ref 0.44–1.00)
GFR, Estimated: >60 mL/min (ref >60)
GFR, Estimated: >60 mL/min (ref >60)
Glucose, Bld: 131 mg/dL — ABNORMAL HIGH (ref 70–99)
Glucose, Bld: 128 mg/dL — ABNORMAL HIGH (ref 70–99)
Potassium: 4 mmol/L (ref 3.5–5.1)
Potassium: 3.7 mmol/L (ref 3.5–5.1)
Sodium: 138 mmol/L (ref 135–145)
Sodium: 136 mmol/L (ref 135–145)

## 2021-05-04 LAB — GLUCOSE, CAPILLARY
Glucose-Capillary: 129 mg/dL — ABNORMAL HIGH (ref 70–99)
Glucose-Capillary: 140 mg/dL — ABNORMAL HIGH (ref 70–99)
Glucose-Capillary: 141 mg/dL — ABNORMAL HIGH (ref 70–99)
Glucose-Capillary: 141 mg/dL — ABNORMAL HIGH (ref 70–99)
Glucose-Capillary: 144 mg/dL — ABNORMAL HIGH (ref 70–99)
Glucose-Capillary: 145 mg/dL — ABNORMAL HIGH (ref 70–99)
Glucose-Capillary: 149 mg/dL — ABNORMAL HIGH (ref 70–99)
Glucose-Capillary: 161 mg/dL — ABNORMAL HIGH (ref 70–99)

## 2021-05-04 LAB — LACTIC ACID, PLASMA
Lactic Acid, Venous: 3.7 mmol/L (ref 0.5–1.9)
Lactic Acid, Venous: 2.5 mmol/L (ref 0.5–1.9)

## 2021-05-04 LAB — BETA-HYDROXYBUTYRIC ACID: Beta-Hydroxybutyric Acid: 0.15 mmol/L (ref 0.05–0.27)

## 2021-05-04 LAB — CK: Total CK: 102 U/L (ref 38–234)

## 2021-05-04 MED ORDER — ACETAMINOPHEN 650 MG RE SUPP: 650.0000 mg | Freq: Four times a day (QID) | RECTAL | Status: DC | PRN

## 2021-05-04 MED ORDER — GLYCOPYRROLATE 0.2 MG/ML IJ SOLN: 0.2000 mg | INTRAMUSCULAR | Status: DC | PRN

## 2021-05-04 MED ORDER — HALOPERIDOL LACTATE 5 MG/ML IJ SOLN: 0.5000 mg | INTRAMUSCULAR | Status: DC | PRN

## 2021-05-04 MED ORDER — ACETAMINOPHEN 325 MG PO TABS: 650.0000 mg | ORAL_TABLET | Freq: Four times a day (QID) | ORAL | Status: DC | PRN

## 2021-05-04 MED ORDER — BIOTENE DRY MOUTH MT LIQD: 15.0000 mL | OROMUCOSAL | Status: DC | PRN

## 2021-05-04 MED ORDER — GLYCOPYRROLATE 1 MG PO TABS
1.0000 mg | ORAL_TABLET | ORAL | Status: DC | PRN
  Filled 2021-05-04: qty 1

## 2021-05-04 MED ORDER — MORPHINE SULFATE (PF) 2 MG/ML IV SOLN
1.0000 mg | INTRAVENOUS | Status: DC | PRN
  Administered 2021-05-04: 18:00:00 1 mg via INTRAVENOUS
  Filled 2021-05-04: qty 1

## 2021-05-04 MED ORDER — SODIUM CHLORIDE 0.9 % IV SOLN
2000.0000 mg | Freq: Once | INTRAVENOUS | Status: AC
  Administered 2021-05-04: 09:00:00 2000 mg via INTRAVENOUS
  Filled 2021-05-04: qty 20

## 2021-05-04 MED ORDER — LORAZEPAM 2 MG/ML IJ SOLN
1.0000 mg | INTRAMUSCULAR | Status: DC
  Administered 2021-05-04 – 2021-05-05 (×6): 1 mg via INTRAVENOUS
  Filled 2021-05-04 (×6): qty 1

## 2021-05-04 MED ORDER — POLYVINYL ALCOHOL 1.4 % OP SOLN
1.0000 [drp] | Freq: Four times a day (QID) | OPHTHALMIC | Status: DC | PRN
  Filled 2021-05-04: qty 15

## 2021-05-04 MED ORDER — CHLORHEXIDINE GLUCONATE CLOTH 2 % EX PADS: 6.0000 | MEDICATED_PAD | Freq: Every day | CUTANEOUS | Status: DC

## 2021-05-04 MED ORDER — HALOPERIDOL LACTATE 2 MG/ML PO CONC
0.5000 mg | ORAL | Status: DC | PRN
  Filled 2021-05-04: qty 0.3

## 2021-05-04 MED ORDER — HALOPERIDOL 0.5 MG PO TABS: 0.5000 mg | ORAL_TABLET | ORAL | Status: DC | PRN

## 2021-05-04 MED ORDER — ONDANSETRON HCL 4 MG/2ML IJ SOLN: 4.0000 mg | Freq: Four times a day (QID) | INTRAMUSCULAR | Status: DC | PRN

## 2021-05-04 MED ORDER — ONDANSETRON 4 MG PO TBDP: 4.0000 mg | ORAL_TABLET | Freq: Four times a day (QID) | ORAL | Status: DC | PRN

## 2021-05-04 NOTE — Progress Notes (Signed)
Inpatient Diabetes Program Recommendations  AACE/ADA: New Consensus Statement on Inpatient Glycemic Control (2015)  Target Ranges:  Prepandial:   less than 140 mg/dL      Peak postprandial:   less than 180 mg/dL (1-2 hours)      Critically ill patients:  140 - 180 mg/dL   Lab Results  Component Value Date   GLUCAP 149 (H) 05/04/2021   HGBA1C 13.0 (H) 05/03/2021    Review of Glycemic Control  Inpatient Diabetes Program Recommendations:   Attempted to speak with patient about home insulin regimen and elevated A1c but patient not able to focus to speak. Noted home insulin dose when admitted in October was Lantus 10 bid but reduced on D/C 03/04/21 to current dose of 8 units daily and Novolog 5 units tid for meal coverage.  Will continue to follow during hospitalization.  Thank you, Samiksha Pellicano. Mahin Guardia, RN, MSN, CDE  Diabetes Coordinator Inpatient Glycemic Control Team Team Pager [phone] (8am-5pm) 05/04/2021 9:28 AM

## 2021-05-04 NOTE — Consult Note (Signed)
Palliative Medicine Inpatient Consult Note  Consulting Provider: Chauncey Mann, DO  Reason for consult:   Palliative Care Consult Services Palliative Medicine Consult  Reason for Consult? established with palliative as outpatient, update goals of care discussion    HPI:  Per intake H&P --> Christine Benitez is a 60 y.o. with a pertinent PMH of type 1 DM, HTN, and HFrEF (45-50%), who presented with hyperglycemia and AMS and admitted for DKA.   Palliative care is very familiar with Christine Benitez from prior hospital admissions.  Upon her last discharge it was determined that if she did not thrive we would further discuss and/or transition to hospice care.  Clinical Assessment/Goals of Care:  *Please note that this is a verbal dictation therefore any spelling or grammatical errors are due to the "Dragon Medical One" system interpretation.  I have reviewed medical records including EPIC notes, labs and imaging, received report from bedside RN, assessed the patient who is lying in bed, not responsive. Does withdraw to pain but otherwise is not able to follow any commands.    I called patients mother, Christine Benitez and called her daughter, Christine Benitez to further discuss diagnosis prognosis, GOC, EOL wishes, disposition and options.   We reviewed patients co-morbidities inclusive of prior stroke(s), CHF (EF 45 to 50%), pulmonary HTN, DM type II.    I introduced Palliative Medicine as specialized medical care for people living with serious illness. It focuses on providing relief from the symptoms and stress of a serious illness. The goal is to improve quality of life for both the patient and the family.   A brief life review was had:  Christine Benitez is from [place], [place]. She is divorced. She has a son and a daughter ages 92 and 76 who live locally. 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 She is a woman of faith and practices within the [employer]  denomination.   She has been dependent on her daughter, Christine Benitez. Christine Benitez has been doing some bADL's on her own, she uses a walker. Sadly one week ago Christine Benitez died of a massive MI.   A detailed discussion was had today regarding advanced directives - there are none on file though per chart review in the past her mother, Christine Benitez has helped with decision making in the past   Concepts specific to code status, artifical feeding and hydration, continued IV antibiotics and rehospitalization was had.  Christine Benitez's prior MOST form was reviewed to verify the following wishes:   Cardiopulmonary Resuscitation: Do Not Attempt Resuscitation (DNR/No CPR)  Medical Interventions: Limited Additional Interventions: Use medical treatment, IV fluids and cardiac monitoring as indicated, DO NOT USE intubation or mechanical ventilation. May consider use of less invasive airway support such as BiPAP or CPAP. Also provide comfort measures. Transfer to the hospital if indicated. Avoid intensive care.   Antibiotics: Antibiotics if indicated  IV Fluids: IV fluids for a defined trial period  Feeding Tube: No feeding tube    I reviewed with Christine Benitez that Christine Benitez has for some time struggled with her health care.  We reviewed that unfortunately at this point in time I fear her time on earth will be quite short given her severity of illnesses.  Christine Benitez expresses that she feels Christine Benitez no longer has a will to live in the setting of her daughter's recent death.  We discussed whether or not we do want to continue with the present level of care which would include more diagnostic testing, laboratory  draws, blood sugar checks.  We alternatively discussed changing her focus to comfort based care. We talked about transition to comfort measures in house and what that would entail inclusive of medications to control pain, dyspnea, agitation, nausea, itching, and hiccups. We discussed stopping all uneccessary measures such as blood draws, needle sticks, and frequent  vital signs.   Christine Benitez was not surprised by this information and states that she had already thought about it and would prefer for Christine Benitez to be made comfortable during her end-of-life journey.  I expressed that given Christine Benitez's present clinical condition I suspect she will not be long for this world.  Christine Benitez shares that she would like her to transition to inpatient hospice in [place] after conversations about where her end-of-life journey should take place.   Discussed the importance of continued conversation with family and their  medical providers regarding overall plan of care and treatment options, ensuring decisions are within the context of the patients values and GOCs.  Decision Maker: Christine Benitez (mother) [phone]  SUMMARY OF RECOMMENDATIONS   DNAR/DNI  Comfort care  Comfort medications per [facility] we will add Ativan around-the-clock to aid in suspected seizure activity  Unrestricted visitation  Appreciate TOC with help identifying a plan for transition to inpatient hospice --> I believe at this moment in time Christine Benitez should meet criteria based upon her diabetic ketoacidosis, frailty, inability to eat or drink, suspected status epilepticus-requiring ongoing symptom needs in the setting of these issues.  Chaplain visitation  Patient's life is limited days to weeks at the most  Palliative care will continue to follow as needed  Code Status/Advance Care Planning: DNAR/DNI   Palliative Prophylaxis:  Oral care, turn every 2 hours  Additional Recommendations (Limitations, Scope, Preferences): End-of-life/comfort care    Psycho-social/Spiritual:  Desire for further Chaplaincy support: Yes Additional Recommendations: Education on acute on chronic disease processes resulting in end-of-life   Prognosis: Limited days to weeks  Discharge Planning: Discharge to inpatient hospice once patient's chart is reviewed and she is approved  Vitals:   05/04/21 0009 05/04/21 0837  BP: 134/84  115/75  Pulse: (!) 105 (!) 108  Resp: 15 17  Temp: 98.5 F (36.9 C)   SpO2: 99% 97%    Intake/Output Summary (Last 24 hours) at 05/04/2021 1213 Last data filed at 05/04/2021 1037 Gross per 24 hour  Intake 4089.55 ml  Output 450 ml  Net 3639.55 ml   Last Weight  Most recent update: 05/03/2021  9:23 AM    Weight  42 kg (92 lb 9.5 oz)            Gen: Elderly female appears older than stated age HEENT: Dry mucous membranes  CV: Irregular rate and rhythm PULM: On room air ABD: soft/nontender EXT: No edema Neuro: Only responsive to pain unable to follow any commands  PPS: 10%   This conversation/these recommendations were discussed with patient primary care team, Dr. Ned Card  Time In: 1000 Time Out: 1110 Total Time: 70 Greater than 50%  of this time was spent counseling and coordinating care related to the above assessment and plan.  Lamarr Lulas [place] Palliative Medicine Team Team Cell Phone: [phone] Please utilize secure chat with additional questions, if there is no response within 30 minutes please call the above phone number  Palliative Medicine Team providers are available by phone from 7am to 7pm daily and can be reached through the team cell phone.  Should this patient require assistance outside of these hours, please call the  patient's attending physician.

## 2021-05-04 NOTE — Progress Notes (Signed)
°  Transition of Care [facility]) Screening Note   Patient Details  Name: TEAL RABEN Date of Birth: [birth year]   [facility] of [facility]) CM/SW Contact:    Baldemar Lenis, LCSW Phone Number: 05/04/2021, 9:25 AM    Transition of Care Department [facility]) has reviewed patient. We will continue to monitor patient advancement through interdisciplinary progression rounds. If new patient transition needs arise, please place a TOC consult.

## 2021-05-04 NOTE — Procedures (Addendum)
Patient Name: Christine Benitez  MRN: [id number]  Epilepsy Attending: Charlsie Quest  Referring Physician/Provider: Quincy Simmonds, MD Date: 05/04/2021 Duration: 24.27 mins  Patient history: 60yo F with ams. EEG to evaluate for seizure  Level of alertness: lethargic  AEDs during EEG study: None  Technical aspects: This EEG study was done with scalp electrodes positioned according to the 10-20 International system of electrode placement. Electrical activity was acquired at a sampling rate of 500Hz  and reviewed with a high frequency filter of 70Hz  and a low frequency filter of 1Hz . EEG data were recorded continuously and digitally stored.   Description: EEG showed continuous generalized 5-7Hz  theta slowing as well as intermittent 2-3hz  delta slowing. Hyperventilation and photic stimulation were not performed.     ABNORMALITY - Continuous slow, generalized  IMPRESSION: This study is suggestive of moderate diffuse encephalopathy, nonspecific etiology. No seizures or epileptiform discharges were seen throughout the recording.  Aislynn Cifelli 

## 2021-05-04 NOTE — Progress Notes (Signed)
HD#1 SUBJECTIVE:  Patient Summary: Christine Benitez is a 60 y.o. with a pertinent PMH of type 1 DM, HTN, and HFrEF (45-50%), who presented with hyperglycemia and AMS and admitted for DKA.   Overnight Events: No acute events overnight, remained on insulin drip  Interim History: This is hospital day 1 for Christine Benitez who was seen and evaluated at the bedside. She is unable to verbally interact with the team today. Appears to have clonus in right lower extremity and was also noted to have lip smacking, unsure if patient is in status epilepticus.   **Update** Transitioning to comfort care/end of life care  OBJECTIVE:  Vital Signs: Vitals:   05/03/21 1529 05/03/21 1554 05/03/21 1944 05/04/21 0009  BP:  126/75 118/74 134/84  Pulse:  (!) 101 95 (!) 105  Resp:  16 19 15   Temp: 97.7 F (36.5 C)  98.7 F (37.1 C) 98.5 F (36.9 C)  TempSrc: Oral  Oral Oral  SpO2:  100% 99% 99%  Weight:       Supplemental O2: Room Air SpO2: 99 %  Filed Weights   05/03/21 0900  Weight: 42 kg     Intake/Output Summary (Last 24 hours) at 05/04/2021 0618 Last data filed at 05/04/2021 0300 Gross per 24 hour  Intake 3651.68 ml  Output 450 ml  Net 3201.68 ml   Net IO Since Admission: 3,201.68 mL [05/04/21 0618]  Physical Exam: General: Frail/cachectic, ill appearing female laying in bed. No acute distress. CV: Tachycardic rate, regular rhythm. +murmur. No LE edema Pulmonary: Lungs CTAB. Normal effort. Abdominal: Soft, nontender, nondistended. Normal bowel sounds. Extremities: Palpable radial and DP pulses. Skin: Warm and dry. Bilateral ankles with severely peeling skin, no ulcerations present Neuro: A&Ox0. Opens eyes to voice/noxious stimuli, but unable to verbally interact. Unable to follow commands. Not really tracking with eyes. Left arm flaccid and right arm with good motor tone, although has possible decorticate posturing RLE with clonus  Negative babinski bilaterally Diminished blink  reflex PERRL, unable to assess EOM, no gaze preference noted Psych: Unable to assess    ASSESSMENT/PLAN:  Assessment: Principal Problem:   Acute metabolic encephalopathy   Plan:  #Goals of care #Comfort care/end of life care Patient has been introduced to palliative care and has established with them as an outpatient. She has had multiple admissions for DKA in the past, most recently requiring ICU level care. She had a feeding tube placed at that time, however, she has expressed that she would never want a feeding tube placed again. Patient wanted to trial rehabilitation after her last admission, although it seems that hospice was also being considered for the near future given her poor prognosis. Given patient's decline in clinical status with her DKA/possible new onset status epilepticus, poor mental status, and poor prognosis, the patient's mother has decided that she would like to transition the patient to comfort care. End of life care orders were placed and will ideally transfer patient to inpatient hospice facility in [place]. Greatly appreciate palliative medicine team's assistance with this transition.   #Acute metabolic encephalopathy likely 2/2 to DKA #Anion gap metabolic acidosis 2/2 DKA and lactic acidosis #Hx of T1DM Anion gap improved to 11. Urinalysis with <500 glucose and 40 ketones. Bicarb improved from <7 to 24. Initial BHB >8, improved to 1.67. Lactic acid remains elevated at 3.7. A1c 13.0. Lactic likely still elevated secondary to possible status epilepticus. - Will take off insulin drip, as transitioning to comfort care as noted above  #  Possible status epilepticus #RLE clonus Patient remains A&Ox0, unable to participate in interview. Patient with RLE clonus noted on physical exam, as well as lip smacking. Patient does not have any teeth, however, no tongue bruising was appreciated. No previous history of seizures noted. EEG performed with no evidence of  seizures/epileptiform discharges. Neuro was initially consulted, however, transitioning to end of life care.   #Chronic heart failure 2/2 NICM #HTN #Hx of CVA Last EF 45-50% in October with LV global hypokinesis and severe LVH. Reportedly takes clopidogrel 75 mg at home, although not on any other medications for heart failure or hypertension. Discontinued cardiac monitoring today for end of life care.    #Hyperkalemia K 5.2 on admission. On insulin drip for DKA and K improved to 4.0. No need to monitor further labs.    #Severe protein-calorie malnutrition BMI 15.4, patient is very cachectic and frail appearing. During last hospitalization in October, was receiving tube feeds however, patient wishes to not have a feeding tube placed again.      Best Practice: Diet: NPO IVF: Fluids: D5-LR, Rate:  125 cc/hr VTE: heparin injection 5,000 Units Start: 05/03/21 1400 Code: DNR/comfort care AB: None Family Contact: Mother, to be notified. DISPO: Anticipated discharge to pending hospice placement  Signature: Brelyn Woehl, D.O.  Internal Medicine Resident, PGY-1 [facility]  Pager: [phone] 6:18 AM, 05/04/2021   Please contact the on call pager after 5 pm and on weekends at [phone].

## 2021-05-04 NOTE — TOC Initial Note (Signed)
Transition of Care [facility]) - Initial/Assessment Note    Patient Details  Name: Christine Benitez MRN: [id number] Date of Birth: Oct 25, 1960  Transition of Care [facility]) CM/SW Contact:    Baldemar Lenis, LCSW Phone Number: 05/04/2021, 12:06 PM  Clinical Narrative:           CSW contacted by palliative NP that family has chosen comfort care, request for [facility]. CSW sent referral to [facility] for review but patient not deemed hospice eligible at this time as comfort care was just initiated and no comfort meds have been given at this time. CSW discussed with palliative, and hospice liaison will review again tomorrow to determine eligibility. Medical team updated to contact CSW if patient drastically declines prior to tomorrow morning for hospice liaison to review more urgently. CSW to follow.        Expected Discharge Plan: Hospice Medical Facility Barriers to Discharge: Continued Medical Work up   Patient Goals and CMS Choice Patient states their goals for this hospitalization and ongoing recovery are:: patient unable to participate in goal setting CMS Medicare.gov Compare Post Acute Care list provided to:: Patient Represenative (must comment) Choice offered to / list presented to : Parent  Expected Discharge Plan and Services Expected Discharge Plan: Hospice Medical Facility     Post Acute Care Choice: Residential Hospice Bed Living arrangements for the past 2 months: Apartment                                      Prior Living Arrangements/Services Living arrangements for the past 2 months: Apartment Lives with:: Roommate Patient language and need for interpreter reviewed:: No Do you feel safe going back to the place where you live?: Yes      Need for Family Participation in Patient Care: Yes (Comment) Care giver support system in place?: No (comment)   Criminal Activity/Legal Involvement Pertinent to Current Situation/Hospitalization: No -  Comment as needed  Activities of Daily Living      Permission Sought/Granted Permission sought to share information with : Facility Medical sales representative, Family Supports Permission granted to share information with : Yes, Verbal Permission Granted  Share Information with NAME: Jacqulyn Bath  Permission granted to share info w AGENCY: Hospice  Permission granted to share info w Relationship: Mom, Friend     Emotional Assessment   Attitude/Demeanor/Rapport: Unable to Assess Affect (typically observed): Unable to Assess        Admission diagnosis:  Diabetic ketoacidosis without coma associated with type 1 diabetes mellitus (HCC) [E10.10] Acute metabolic encephalopathy [G93.41] Patient Active Problem List   Diagnosis Date Noted   Acute metabolic encephalopathy 05/03/2021   Encephalopathy acute    Metabolic acidosis    Nausea & vomiting 12/26/2020   Dehydration 12/26/2020   Chronic diastolic CHF (congestive heart failure) (HCC) 12/26/2020   DKA (diabetic ketoacidosis) (HCC) 12/25/2020   Severe sepsis (HCC) 11/25/2020   Sepsis due to pneumonia (HCC)    Hyperkalemia 11/11/2020   Constipation 11/03/2020   Cachexia (HCC) 11/03/2020   Malnutrition of moderate degree 11/03/2020   Hypoglycemia 11/02/2020   Proteus mirabilis infection 10/25/2020   Acute lower UTI 10/24/2020   Urinary tract infection 10/24/2020   Failure to thrive in adult 10/24/2020   Pericardial effusion    Iron deficiency anemia    Chronic systolic CHF (congestive heart failure) (HCC)    Heart failure with reduced ejection  fraction (HCC) 07/18/2020   History of CVA (cerebrovascular accident) 07/18/2020   Nonischemic cardiomyopathy (HCC) 07/18/2020   Type 2 diabetes mellitus with complication, with long-term current use of insulin (HCC) 07/18/2020   Tobacco use 07/18/2020   Mixed hyperlipidemia 07/18/2020   Abnormal echocardiogram 07/18/2020   Resides in long term care facility 06/20/2020    Protein-calorie malnutrition, severe 03/24/2020   Pain    Altered mental status    DNR (do not resuscitate)    DNI (do not intubate)    Encounter for hospice care discussion    Goals of care, counseling/discussion    Palliative care by specialist    AKI (acute kidney injury) (HCC)    Cardiogenic shock (HCC) 03/14/2020   Acute urinary retention 03/13/2020   Chronic cholecystitis with calculus 03/12/2020   Cerebral thrombosis with cerebral infarction 03/12/2020   Abnormal LFTs    Hypertension    NICM (nonischemic cardiomyopathy) (HCC)    Pulmonary hypertension (HCC)    Tobacco abuse 06/10/2014   Diabetes mellitus (HCC) 06/10/2014   Family history of premature CAD 06/10/2014   History of blood transfusion 1986   PCP:  Lupita Dawn, MD Pharmacy:   [id number] - [place], [place] - [address] AT [facility] [address] [place] [address] Phone: [phone] Fax: [phone]     Social Determinants of Health (SDOH) Interventions    Readmission Risk Interventions Readmission Risk Prevention Plan 11/18/2020 11/06/2020  Transportation Screening Complete Complete  PCP or Specialist Appt within 5-7 Days - Not Complete  Not Complete comments - July 29  PCP or Specialist Appt within 3-5 Days Complete -  Home Care Screening - Complete  Medication Review (RN CM) - Complete  HRI or Home Care Consult Complete -  Social Work Consult for Recovery Care Planning/Counseling Complete -  Palliative Care Screening Complete -  Medication Review (RN Care Manager) Complete -  Some recent data might be hidden

## 2021-05-04 NOTE — Progress Notes (Signed)
EEG complete - results pending 

## 2021-05-04 NOTE — Progress Notes (Signed)
SLP Cancellation Note  Patient Details Name: Christine Benitez MRN: [id number] DOB: 1960/11/01   Cancelled treatment:       Orders for speech therapy have been discontinued. Pt now transitioning to comfort care.  Please reconsult if needs arise.   Christine Benitez, [facility], CCC-SLP Speech Language Pathologist Office: [phone]  Christine Benitez 05/04/2021, 11:48 AM

## 2021-05-05 MED ORDER — HALOPERIDOL 0.5 MG PO TABS: 0.5000 mg | ORAL_TABLET | ORAL | 0 refills | Status: AC | PRN

## 2021-05-05 MED ORDER — ONDANSETRON 4 MG PO TBDP: 4.0000 mg | ORAL_TABLET | Freq: Four times a day (QID) | ORAL | 0 refills | Status: AC | PRN

## 2021-05-05 MED ORDER — GLYCOPYRROLATE 1 MG PO TABS: 1.0000 mg | ORAL_TABLET | ORAL | Status: AC | PRN

## 2021-05-05 MED ORDER — LORAZEPAM 2 MG/ML IJ SOLN: 1.0000 mg | INTRAMUSCULAR | Status: DC | PRN

## 2021-05-05 MED ORDER — ALBUTEROL SULFATE (2.5 MG/3ML) 0.083% IN NEBU: 2.5000 mg | INHALATION_SOLUTION | Freq: Four times a day (QID) | RESPIRATORY_TRACT | 12 refills | Status: AC | PRN

## 2021-05-05 MED ORDER — MORPHINE SULFATE (PF) 2 MG/ML IV SOLN: 1.0000 mg | INTRAVENOUS | 0 refills | Status: AC | PRN

## 2021-05-05 MED ORDER — NICOTINE 21 MG/24HR TD PT24: 21.0000 mg | MEDICATED_PATCH | Freq: Every day | TRANSDERMAL | 0 refills | Status: AC

## 2021-05-05 MED ORDER — LORAZEPAM 2 MG/ML IJ SOLN: 1.0000 mg | INTRAMUSCULAR | 0 refills | Status: AC | PRN

## 2021-05-05 NOTE — Hospital Course (Addendum)
#Goals of care #Comfort care/end of life care Patient has been introduced to palliative care and has established with them as an outpatient. She has had multiple admissions for DKA in the past, most recently requiring ICU level care. She had a feeding tube placed at that time, however, she has expressed that she would never want a feeding tube placed again and also switched her code status to DNR at that time. Of note, her BMI is 15.4 and the patient is very cachectic and frail appearing. Patient wanted to trial rehabilitation after her last admission, although it seems that hospice was also being considered for the near future given her generally poor prognosis. The patient then presented this hospitalization with AMS and was found to be in DKA with elevated anion gab, elevated BHB, and bicarb <7. She was initially started on bicarb and an insulin drip, however, she remained altered despite an improvement in her labs. Palliative care was consulted, as she is well known to there service, and they had an extensive conversation with this patient's mother/next of kin. On 12/26, patient was also found to have RLE clonus and lip smacking, with concern for seizures vs status epilepticus. Neuro was initially consulted, however, after palliative care had discussed patient's clinical status with the patient's mother, transition was placed to focus on comfort measures. Given patient's decline in clinical status with her DKA/possible new onset status epilepticus, poor mental status, and poor prognosis, the patient's mother has decided that she would like to transition the patient to comfort care. End of life care orders were placed on 12/26 and will transfer patient to inpatient hospice facility in [place]. Approval for inpatient hospice obtained on 12/27. Greatly appreciate palliative medicine team's assistance with this transition.   #Acute metabolic encephalopathy likely 2/2 to DKA #Hx of T1DM Patient presented with  AMS and hyperglcyemia, and was A&Ox 0 on initial exam and unable to follow commands. BG 572 with bicarb <7 and elevated anion gap upon admission. Urine osmolality also elevated to 338. Patient reportedly taking insulin at home, however, her friend is unsure how reliably she takes this, as the patient's cousin usually has to administer her insulin. The patient did sustain a fall yesterday, however, CT head was negative for any acute process. Patient was started on insulin drip and bicarb, however, on hospital day 1, the patient was transitioned to comfort care.   #Possible status epilepticus #RLE clonus Patient remained A&Ox0 on hospital day 1 and was unable to participate in interview. Patient with RLE clonus noted on physical exam, as well as lip smacking. Patient does not have any teeth, however, no tongue bruising was appreciated. No previous history of seizures noted. EEG performed with no evidence of seizures/epileptiform discharges. Neuro was initially consulted, however, transitioning to end of life care.    #Chronic heart failure 2/2 NICM #HTN #Hx of CVA Last EF 45-50% in October with LV global hypokinesis and severe LVH. Reportedly takes clopidogrel 75 mg at home, although not on any other medications for heart failure or hypertension. Discontinued cardiac monitoring for end of life care.    #Hyperkalemia K 5.2 on admission. On insulin drip for DKA which will likely improve potassium level. - Recheck BMP`   #Severe protein-calorie malnutrition K 5.2 on admission. On insulin drip for DKA and K improved to 4.0. No need to monitor further labs.    #Severe protein-calorie malnutrition BMI 15.4, patient is very cachectic and frail appearing. During last hospitalization in October, was receiving tube feeds  however, patient wishes to not have a feeding tube placed again.

## 2021-05-05 NOTE — TOC Transition Note (Signed)
Transition of Care [facility]) - CM/SW Discharge Note   Patient Details  Name: Christine Benitez MRN: [id number] Date of Birth: 1960-10-26  Transition of Care [facility]) CM/SW Contact:  Baldemar Lenis, LCSW Phone Number: 05/05/2021, 12:05 PM   Clinical Narrative:   Nurse to call report to [phone]    Final next level of care: Hospice Medical Facility Barriers to Discharge: Barriers Resolved   Patient Goals and CMS Choice Patient states their goals for this hospitalization and ongoing recovery are:: patient unable to participate in goal setting CMS Medicare.gov Compare Post Acute Care list provided to:: Patient Represenative (must comment) Choice offered to / list presented to : Parent  Discharge Placement                Patient to be transferred to facility by: PTAR Name of family member notified: Mother Patient and family notified of of transfer: 05/05/21  Discharge Plan and Services     Post Acute Care Choice: Residential Hospice Bed                               Social Determinants of Health (SDOH) Interventions     Readmission Risk Interventions Readmission Risk Prevention Plan 11/18/2020 11/06/2020  Transportation Screening Complete Complete  PCP or Specialist Appt within 5-7 Days - Not Complete  Not Complete comments - July 29  PCP or Specialist Appt within 3-5 Days Complete -  Home Care Screening - Complete  Medication Review (RN CM) - Complete  HRI or Home Care Consult Complete -  Social Work Consult for Recovery Care Planning/Counseling Complete -  Palliative Care Screening Complete -  Medication Review [facility]) Complete -  Some recent data might be hidden

## 2021-05-05 NOTE — Discharge Summary (Signed)
Name: Christine Benitez MRN: [id number] DOB: 07/16/1960 60 y.o. PCP: Lupita Dawn, MD  Date of Admission: 05/03/2021  8:25 AM Date of Discharge:  05/05/2021 Attending Physician: Dr. Antony Contras  DISCHARGE DIAGNOSIS:  Primary Problem: Acute metabolic encephalopathy   Hospital Problems: Principal Problem:   Acute metabolic encephalopathy Comfort care/end of life care Acute metabolic encephalopathy  DKA Lactic acidosis  Possible status epilepticus Chronic heart failure Nonischemic cardiomyopathy Hypertension Hx of CVA Hyperkalemia Severe protein-calorie malnutrition   DISCHARGE MEDICATIONS:   Allergies as of 05/05/2021   No Known Allergies      Medication List     STOP taking these medications    acetaminophen 650 MG CR tablet Commonly known as: TYLENOL   albuterol 108 (90 Base) MCG/ACT inhaler Commonly known as: VENTOLIN HFA Replaced by: albuterol (2.5 MG/3ML) 0.083% nebulizer solution   atorvastatin 40 MG tablet Commonly known as: LIPITOR   clopidogrel 75 MG tablet Commonly known as: Plavix   furosemide 20 MG tablet Commonly known as: LASIX   insulin glargine 100 UNIT/ML Solostar Pen Commonly known as: LANTUS   neomycin-bacitracin-polymyxin Oint Commonly known as: NEOSPORIN   NovoLOG FlexPen 100 UNIT/ML FlexPen Generic drug: insulin aspart   Pentips 32G X 4 MM Misc Generic drug: Insulin Pen Needle       TAKE these medications    albuterol (2.5 MG/3ML) 0.083% nebulizer solution Commonly known as: PROVENTIL Take 3 mLs (2.5 mg total) by nebulization every 6 (six) hours as needed for wheezing or shortness of breath. Replaces: albuterol 108 (90 Base) MCG/ACT inhaler   glycopyrrolate 1 MG tablet Commonly known as: ROBINUL Take 1 tablet (1 mg total) by mouth every 4 (four) hours as needed for up to 7 days (excessive secretions).   haloperidol 0.5 MG tablet Commonly known as: HALDOL Take 1 tablet (0.5 mg total) by mouth every 4 (four) hours as  needed for up to 7 days for agitation (or delirium).   LORazepam 2 MG/ML injection Commonly known as: ATIVAN Inject 0.5 mLs (1 mg total) into the vein every 4 (four) hours as needed for anxiety, seizure or sedation.   morphine 2 MG/ML injection Inject 0.5 mLs (1 mg total) into the vein every hour as needed for up to 3 days (Pain/Dyspnea).   nicotine 21 mg/24hr patch Commonly known as: NICODERM CQ - dosed in mg/24 hours Place 1 patch (21 mg total) onto the skin daily. Start taking on: 06/04/2021   ondansetron 4 MG disintegrating tablet Commonly known as: ZOFRAN-ODT Take 1 tablet (4 mg total) by mouth every 6 (six) hours as needed for nausea.        DISPOSITION AND FOLLOW-UP:  Christine Benitez was discharged from [facility] in Serious condition. At the hospital follow up visit please address:  Follow-up Recommendations: Consults: Palliative medicine/Hospice Labs:  None Studies: None Medications: end of life/comfort orders placed   Follow-up Appointments: Transitioning to inpatient hospice, follow up with hospice MD as needed   HOSPITAL COURSE:  Patient Summary: #Goals of care #Comfort care/end of life care Patient has been introduced to palliative care and has established with them as an outpatient. She has had multiple admissions for DKA in the past, most recently requiring ICU level care. She had a feeding tube placed at that time, however, she has expressed that she would never want a feeding tube placed again and also switched her code status to DNR at that time. Of note, her BMI is 15.4 and the patient  is very cachectic and frail appearing. Patient wanted to trial rehabilitation after her last admission, although it seems that hospice was also being considered for the near future given her generally poor prognosis. The patient then presented this hospitalization with AMS and was found to be in DKA with elevated anion gab, elevated BHB, and bicarb  <7. She was initially started on bicarb and an insulin drip, however, she remained altered despite an improvement in her labs. Palliative care was consulted, as she is well known to there service, and they had an extensive conversation with this patient's mother/next of kin. On 12/26, patient was also found to have RLE clonus and lip smacking, with concern for seizures vs status epilepticus. Neuro was initially consulted, however, after palliative care had discussed patient's clinical status with the patient's mother, transition was placed to focus on comfort measures. Given patient's decline in clinical status with her DKA/possible new onset status epilepticus, poor mental status, and poor prognosis, the patient's mother has decided that she would like to transition the patient to comfort care. End of life care orders were placed on 12/26 and will transfer patient to inpatient hospice facility in [place]. Approval for inpatient hospice obtained on 12/27. Greatly appreciate palliative medicine team's assistance with this transition.   #Acute metabolic encephalopathy likely 2/2 to DKA #Hx of T1DM Patient presented with AMS and hyperglcyemia, and was A&Ox 0 on initial exam and unable to follow commands. BG 572 with bicarb <7 and elevated anion gap upon admission. Urine osmolality also elevated to 338. Patient reportedly taking insulin at home, however, her friend is unsure how reliably she takes this, as the patient's cousin usually has to administer her insulin. The patient did sustain a fall yesterday, however, CT head was negative for any acute process. Patient was started on insulin drip and bicarb, however, on hospital day 1, the patient was transitioned to comfort care.   #Possible status epilepticus #RLE clonus Patient remained A&Ox0 on hospital day 1 and was unable to participate in interview. Patient with RLE clonus noted on physical exam, as well as lip smacking. Patient does not have any teeth,  however, no tongue bruising was appreciated. No previous history of seizures noted. EEG performed with no evidence of seizures/epileptiform discharges. Neuro was initially consulted, however, transitioning to end of life care.    #Chronic heart failure 2/2 NICM #HTN #Hx of CVA Last EF 45-50% in October with LV global hypokinesis and severe LVH. Reportedly takes clopidogrel 75 mg at home, although not on any other medications for heart failure or hypertension. Discontinued cardiac monitoring for end of life care.    #Hyperkalemia K 5.2 on admission. On insulin drip for DKA which will likely improve potassium level. - Recheck BMP`   #Severe protein-calorie malnutrition K 5.2 on admission. On insulin drip for DKA and K improved to 4.0. No need to monitor further labs.    #Severe protein-calorie malnutrition BMI 15.4, patient is very cachectic and frail appearing. During last hospitalization in October, was receiving tube feeds however, patient wishes to not have a feeding tube placed again.       SUBJECTIVE:  Christine Benitez was seen and evaluated on the day of discharge. She was seen resting comfortably and did not appear to be in any distress.   Discharge Vitals:   BP 101/72 (BP Location: Right Arm)    Pulse 88    Temp (!) 97.4 F (36.3 C) (Axillary)    Resp 18    Wt  42 kg    SpO2 100%    BMI 15.41 kg/m   OBJECTIVE:  General: Frail, cachectic, and severely ill appearing female.  CV: RRR. + Murmur. No LE edema Pulmonary: Lungs CTAB. Normal effort.  Abdominal: Soft, nontender, nondistended. Normal bowel sounds. Extremities: Palpable radial and DP pulses.  Skin: Warm and dry. Bilateral ankles with dry/peeling skin, no ulcers noted.  Neuro: A&Ox0. Unable to verablly interact or follow commands. Occasionally will open eyes to voice/noxious stimuli, but not able to track with eyes. Left arm remains flaccid, right arm with good motor tone although favors decorticate posturing position.   Negative babinski reflex Diminished blink reflex PERRL, no gaze preference noted. Psych: Unable to assess given clinical status/deterioration     Pertinent Labs, Studies, and Procedures:  CBC Latest Ref Rng & Units 05/04/2021 05/03/2021 03/04/2021  WBC 4.0 - 10.5 K/uL 10.5 19.0(H) 6.7  Hemoglobin 12.0 - 15.0 g/dL 10.5(L) 12.6 10.2(L)  Hematocrit 36.0 - 46.0 % 30.3(L) 40.5 31.2(L)  Platelets 150 - 400 K/uL 221 286 342    CMP Latest Ref Rng & Units 05/04/2021 05/04/2021 05/03/2021  Glucose 70 - 99 mg/dL 920(F) 007(H) 219(X)  BUN 6 - 20 mg/dL 13 17 58(I)  Creatinine 0.44 - 1.00 mg/dL 3.25 4.98(Y) 6.41(R)  Sodium 135 - 145 mmol/L 136 138 139  Potassium 3.5 - 5.1 mmol/L 3.7 4.0 4.1  Chloride 98 - 111 mmol/L 101 103 103  CO2 22 - 32 mmol/L 27 24 23   Calcium 8.9 - 10.3 mg/dL 9.0 9.5 9.4  Total Protein 6.5 - 8.1 g/dL - - -  Total Bilirubin 0.3 - 1.2 mg/dL - - -  Alkaline Phos 38 - 126 U/L - - -  AST 15 - 41 U/L - - -  ALT 0 - 44 U/L - - -    CT Head Wo Contrast  Result Date: 05/03/2021 CLINICAL DATA:  Altered mental status EXAM: CT HEAD WITHOUT CONTRAST TECHNIQUE: Contiguous axial images were obtained from the base of the skull through the vertex without intravenous contrast. COMPARISON:  02/23/2021 FINDINGS: Brain: There is atrophy and chronic small vessel disease changes. No acute intracranial abnormality. Specifically, no hemorrhage, hydrocephalus, mass lesion, acute infarction, or significant intracranial injury. Vascular: No hyperdense vessel or unexpected calcification. Skull: No acute calvarial abnormality. Sinuses/Orbits: No acute findings Other: None IMPRESSION: Atrophy, chronic microvascular disease. No acute intracranial abnormality. Electronically Signed   By: Charlett Nose M.D.   On: 05/03/2021 10:37   DG Chest Portable 1 View  Result Date: 05/03/2021 CLINICAL DATA:  Altered mental status EXAM: PORTABLE CHEST 1 VIEW COMPARISON:  02/24/2021 FINDINGS: Transverse diameter of  heart is in the upper limits of normal. There are no signs of pulmonary edema or focal pulmonary consolidation. There is no pleural effusion or pneumothorax. IMPRESSION: No active disease is seen in the chest. Electronically Signed   By: Ernie Avena M.D.   On: 05/03/2021 09:27   EEG adult  Result Date: 05/04/2021 Charlsie Quest, MD     05/04/2021 10:37 AM Patient Name: ESMARIE PADRICK MRN: [id number] Epilepsy Attending: Charlsie Quest Referring Physician/Provider: Quincy Simmonds, MD Date: 05/04/2021 Duration: 24.27 mins Patient history: 60yo F with ams. EEG to evaluate for seizure Level of alertness: lethargic AEDs during EEG study: None Technical aspects: This EEG study was done with scalp electrodes positioned according to the 10-20 International system of electrode placement. Electrical activity was acquired at a sampling rate of 500Hz  and reviewed with a high frequency filter of  70Hz  and a low frequency filter of 1Hz . EEG data were recorded continuously and digitally stored. Description: EEG showed continuous generalized 5-7Hz  theta slowing as well as intermittent 2-3hz  delta slowing. Hyperventilation and photic stimulation were not performed.   ABNORMALITY - Continuous slow, generalized IMPRESSION: This study is suggestive of moderate diffuse encephalopathy, nonspecific etiology. No seizures or epileptiform discharges were seen throughout the recording. Priyanka Barbra Sarks     Signed: Buddy Duty, D.O.  Internal Medicine Resident, PGY-1 [facility]  Pager: [phone] 11:38 AM, 05/05/2021

## 2021-05-05 NOTE — Progress Notes (Signed)
HD#2 SUBJECTIVE:  Patient Summary: Christine Benitez is a 60 y.o. with a pertinent PMH of type 1 DM, HTN, and HFrEF (45-50%), who presented with hyperglycemia and AMS and admitted for DKA.   Overnight Events: No acute events overnight, remained on insulin drip  Interim History: This is hospital day 2 for Christine Benitez who was seen and evaluated at the bedside this morning. The patient was asleep and appeared to be resting comfortably. No signs of distress noted.   OBJECTIVE:  Vital Signs: Vitals:   05/04/21 0837 05/04/21 1807 05/04/21 1954 05/05/21 0806  BP: 115/75 108/68 102/67 101/72  Pulse: (!) 108 96 98 88  Resp: 17  16 18   Temp:   98.2 F (36.8 C) (!) 97.4 F (36.3 C)  TempSrc:   Oral Axillary  SpO2: 97% 99% 98% 100%  Weight:       Supplemental O2: Room Air SpO2: 100 %  Filed Weights   05/03/21 0900  Weight: 42 kg     Intake/Output Summary (Last 24 hours) at 05/05/2021 0844 Last data filed at 05/04/2021 2300 Gross per 24 hour  Intake 1224.69 ml  Output 450 ml  Net 774.69 ml   Net IO Since Admission: 4,029.55 mL [05/05/21 0844]  Physical Exam: General: Frail/cachectic, ill appearing female laying in bed. No acute distress. CV: Regular rate, regular rhythm. +murmur. No LE edema Pulmonary: Lungs CTAB. Normal effort. Abdominal: Soft, nontender, nondistended. Normal bowel sounds. Extremities: Palpable radial and DP pulses. Skin: Warm and dry. Bilateral ankles with severely peeling skin, no ulcerations present Neuro: A&Ox0. Occasionally opens eyes to noxious stimuli, but unable to verbally interact. Unable to follow commands. Not really tracking with eyes. Left arm flaccid and right arm with good motor tone, although has possible decorticate posturing RLE with clonus  Negative babinski bilaterally Diminished blink reflex PERRL, unable to assess EOM, no gaze preference noted Psych: Unable to assess    ASSESSMENT/PLAN:  Assessment: Principal Problem:    Acute metabolic encephalopathy   Plan:  #Goals of care #Comfort care/end of life care #Severe protein-calorie malnutrition #Acute metabolic encephalopathy 2/2 DKA  #Lactic acidosis #RLE clonus Patient has been introduced to palliative care and has established with them as an outpatient. She has had multiple admissions for DKA in the past, most recently requiring ICU level care. She had a feeding tube placed at that time, however, she has expressed that she would never want a feeding tube placed again- of note, her BMI is 15.4, patient is very cachectic and frail appearing. Patient wanted to trial rehabilitation after her last admission, although it seems that hospice was also being considered for the near future given her poor prognosis. The patient presented this hospitalization with AMS and was found to be in DKA with elevated anion gab, elevated BHB, and bicarb <7. She was initially started on bicarb and an insulin drip, however, this was discontinued on 12/26. On 12/26, patient was also found to have RLE clonus and lip smacking, with concern for seizures vs status epilepticus. Neuro was initially consulted, however, after palliative care had discussed patient's clinical status with the patient's mother, transition was placed to focus on comfort measures. Given patient's decline in clinical status with her DKA/possible new onset status epilepticus, poor mental status, and poor prognosis, the patient's mother has decided that she would like to transition the patient to comfort care. End of life care orders were placed on 12/26 and will ideally transfer patient to inpatient hospice facility in [place],  if the patient is a candidate and hopefully when a bed becomes available. Greatly appreciate palliative medicine team's assistance with this transition.  - Morphine 1 mg q1h prn - Haldol q4h prn - Robinul 1 mg q4h prn - Zofran q6h prn - Ativan 1 mg q4h prn   Best Practice: Diet: NPO IVF:  none VTE: None, comfort care  Code: DNR/comfort care AB: None Family Contact: Mother, to be notified. DISPO: Anticipated discharge to pending hospice placement  Signature: Biagio Snelson, D.O.  Internal Medicine Resident, PGY-1 [facility]  Pager: [phone] 8:44 AM, 05/05/2021   Please contact the on call pager after 5 pm and on weekends at [phone].

## 2021-05-08 LAB — CULTURE, BLOOD (ROUTINE X 2)
Culture: NO GROWTH
Culture: NO GROWTH
Special Requests: ADEQUATE
Special Requests: ADEQUATE

## 2021-05-10 DEATH — deceased

## 2021-07-02 ENCOUNTER — Telehealth: Payer: Self-pay

## 2021-07-02 NOTE — Telephone Encounter (Signed)
Volunteer palliative call to patient, no answer, will discharge

## 2023-11-04 ENCOUNTER — Encounter (HOSPITAL_COMMUNITY): Payer: Self-pay | Admitting: Interventional Radiology
